# Patient Record
Sex: Male | Born: 1980 | Race: Black or African American | Hispanic: No | Marital: Single | Smoking: Current every day smoker
Health system: Southern US, Community
[De-identification: ages and names within clinical notes are randomized; demographics above are authoritative.]

## PROBLEM LIST (undated history)

## (undated) DIAGNOSIS — F319 Bipolar disorder, unspecified: Secondary | ICD-10-CM

## (undated) DIAGNOSIS — J189 Pneumonia, unspecified organism: Secondary | ICD-10-CM

## (undated) DIAGNOSIS — G8111 Spastic hemiplegia affecting right dominant side: Secondary | ICD-10-CM

## (undated) DIAGNOSIS — G35D Multiple sclerosis, unspecified: Secondary | ICD-10-CM

## (undated) DIAGNOSIS — F1910 Other psychoactive substance abuse, uncomplicated: Secondary | ICD-10-CM

## (undated) DIAGNOSIS — Z9359 Other cystostomy status: Secondary | ICD-10-CM

## (undated) DIAGNOSIS — G35 Multiple sclerosis: Secondary | ICD-10-CM

## (undated) DIAGNOSIS — D649 Anemia, unspecified: Secondary | ICD-10-CM

## (undated) DIAGNOSIS — F419 Anxiety disorder, unspecified: Secondary | ICD-10-CM

## (undated) DIAGNOSIS — G709 Myoneural disorder, unspecified: Secondary | ICD-10-CM

---
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
## 2008-12-23 ENCOUNTER — Emergency Department (HOSPITAL_COMMUNITY): Admission: EM | Admit: 2008-12-23 | Discharge: 2008-12-23 | Payer: Self-pay | Admitting: Emergency Medicine

## 2009-02-10 ENCOUNTER — Emergency Department (HOSPITAL_COMMUNITY): Admission: EM | Admit: 2009-02-10 | Discharge: 2009-02-10 | Payer: Self-pay | Admitting: Emergency Medicine

## 2009-02-11 ENCOUNTER — Emergency Department (HOSPITAL_COMMUNITY): Admission: EM | Admit: 2009-02-11 | Discharge: 2009-02-11 | Payer: Self-pay | Admitting: Emergency Medicine

## 2009-03-26 ENCOUNTER — Inpatient Hospital Stay (HOSPITAL_COMMUNITY): Admission: EM | Admit: 2009-03-26 | Discharge: 2009-03-30 | Payer: Self-pay | Admitting: Emergency Medicine

## 2009-04-01 ENCOUNTER — Emergency Department (HOSPITAL_COMMUNITY): Admission: EM | Admit: 2009-04-01 | Discharge: 2009-04-01 | Payer: Self-pay | Admitting: Emergency Medicine

## 2009-05-24 ENCOUNTER — Emergency Department (HOSPITAL_COMMUNITY): Admission: EM | Admit: 2009-05-24 | Discharge: 2009-05-24 | Payer: Self-pay | Admitting: Internal Medicine

## 2009-06-02 ENCOUNTER — Inpatient Hospital Stay (HOSPITAL_COMMUNITY): Admission: EM | Admit: 2009-06-02 | Discharge: 2009-06-05 | Payer: Self-pay | Admitting: Internal Medicine

## 2009-10-20 ENCOUNTER — Emergency Department (HOSPITAL_COMMUNITY): Admission: EM | Admit: 2009-10-20 | Discharge: 2009-10-20 | Payer: Self-pay | Admitting: Emergency Medicine

## 2009-11-17 ENCOUNTER — Emergency Department (HOSPITAL_COMMUNITY): Admission: EM | Admit: 2009-11-17 | Discharge: 2009-11-17 | Payer: Self-pay | Admitting: Emergency Medicine

## 2010-05-13 ENCOUNTER — Emergency Department (HOSPITAL_COMMUNITY): Admission: EM | Admit: 2010-05-13 | Discharge: 2010-05-13 | Payer: Self-pay | Admitting: Emergency Medicine

## 2010-05-26 ENCOUNTER — Emergency Department (HOSPITAL_COMMUNITY): Admission: EM | Admit: 2010-05-26 | Discharge: 2010-05-08 | Payer: Self-pay | Admitting: Emergency Medicine

## 2010-07-27 ENCOUNTER — Emergency Department (HOSPITAL_COMMUNITY)
Admission: EM | Admit: 2010-07-27 | Discharge: 2010-07-27 | Disposition: A | Discharge status: Home / Self Care | Payer: Medicaid Other | Attending: Emergency Medicine | Admitting: Emergency Medicine

## 2010-07-27 DIAGNOSIS — R197 Diarrhea, unspecified: Secondary | ICD-10-CM | POA: Insufficient documentation

## 2010-07-27 DIAGNOSIS — R109 Unspecified abdominal pain: Secondary | ICD-10-CM | POA: Insufficient documentation

## 2010-07-27 DIAGNOSIS — R112 Nausea with vomiting, unspecified: Secondary | ICD-10-CM | POA: Insufficient documentation

## 2010-07-27 DIAGNOSIS — G35 Multiple sclerosis: Secondary | ICD-10-CM | POA: Insufficient documentation

## 2010-07-27 LAB — CBC
Hemoglobin: 15.5 g/dL (ref 13.0–17.0)
MCH: 33 pg (ref 26.0–34.0)
MCV: 93.2 fL (ref 78.0–100.0)
RBC: 4.7 MIL/uL (ref 4.22–5.81)

## 2010-07-27 LAB — BASIC METABOLIC PANEL
CO2: 30 mEq/L (ref 19–32)
Chloride: 104 mEq/L (ref 96–112)
Creatinine, Ser: 1.26 mg/dL (ref 0.4–1.5)
GFR calc Af Amer: >60 mL/min (ref >60)

## 2010-07-27 LAB — OCCULT BLOOD, POC DEVICE: Fecal Occult Bld: NEGATIVE

## 2010-08-30 LAB — CBC
MCH: 33 pg (ref 26.0–34.0)
MCHC: 35.9 g/dL (ref 30.0–36.0)
MCV: 92 fL (ref 78.0–100.0)
Platelets: 191 10*3/uL (ref 150–400)
RDW: 13.7 % (ref 11.5–15.5)

## 2010-08-30 LAB — DIFFERENTIAL
Basophils Absolute: 0 10*3/uL (ref 0.0–0.1)
Lymphocytes Relative: 29 % (ref 12–46)
Monocytes Absolute: 0.6 10*3/uL (ref 0.1–1.0)
Monocytes Relative: 8 % (ref 3–12)
Neutro Abs: 4.6 10*3/uL (ref 1.7–7.7)

## 2010-08-30 LAB — RAPID URINE DRUG SCREEN, HOSP PERFORMED
Amphetamines: NOT DETECTED
Barbiturates: NOT DETECTED
Benzodiazepines: NOT DETECTED
Opiates: NOT DETECTED

## 2010-08-30 LAB — POCT I-STAT, CHEM 8
BUN: 19 mg/dL (ref 6–23)
Chloride: 110 mEq/L (ref 96–112)
HCT: 47 % (ref 39.0–52.0)
Sodium: 142 mEq/L (ref 135–145)
TCO2: 24 mmol/L (ref 0–100)

## 2010-08-30 LAB — LACTIC ACID, PLASMA
Lactic Acid, Venous: 2.1 mmol/L (ref 0.5–2.2)
Lactic Acid, Venous: 2.3 mmol/L — ABNORMAL HIGH (ref 0.5–2.2)

## 2010-08-30 LAB — URINALYSIS, ROUTINE W REFLEX MICROSCOPIC
Glucose, UA: NEGATIVE mg/dL
Hgb urine dipstick: NEGATIVE
Protein, ur: NEGATIVE mg/dL

## 2010-08-30 LAB — COMPREHENSIVE METABOLIC PANEL
Albumin: 4 g/dL (ref 3.5–5.2)
BUN: 15 mg/dL (ref 6–23)
Creatinine, Ser: 1.18 mg/dL (ref 0.4–1.5)
Total Protein: 6.8 g/dL (ref 6.0–8.3)

## 2010-08-30 LAB — PROTIME-INR: INR: 0.88 (ref 0.00–1.49)

## 2010-09-19 LAB — BASIC METABOLIC PANEL
BUN: 13 mg/dL (ref 6–23)
BUN: 9 mg/dL (ref 6–23)
CO2: 23 mEq/L (ref 19–32)
CO2: 26 mEq/L (ref 19–32)
Calcium: 8.8 mg/dL (ref 8.4–10.5)
Chloride: 105 mEq/L (ref 96–112)
Creatinine, Ser: 1.07 mg/dL (ref 0.4–1.5)
GFR calc non Af Amer: >60 mL/min (ref >60)
Glucose, Bld: 133 mg/dL — ABNORMAL HIGH (ref 70–99)
Glucose, Bld: 165 mg/dL — ABNORMAL HIGH (ref 70–99)
Potassium: 4.1 mEq/L (ref 3.5–5.1)

## 2010-09-19 LAB — CBC
HCT: 43.5 % (ref 39.0–52.0)
MCHC: 34.8 g/dL (ref 30.0–36.0)
MCV: 95.3 fL (ref 78.0–100.0)
Platelets: 213 10*3/uL (ref 150–400)
RDW: 13.7 % (ref 11.5–15.5)

## 2010-09-19 LAB — MAGNESIUM: Magnesium: 2.1 mg/dL (ref 1.5–2.5)

## 2010-09-20 LAB — CBC
HCT: 45.3 % (ref 39.0–52.0)
Hemoglobin: 15.7 g/dL (ref 13.0–17.0)
MCHC: 34.6 g/dL (ref 30.0–36.0)
Platelets: 236 10*3/uL (ref 150–400)
RDW: 13.8 % (ref 11.5–15.5)

## 2010-09-20 LAB — URINALYSIS, ROUTINE W REFLEX MICROSCOPIC
Bilirubin Urine: NEGATIVE
Ketones, ur: NEGATIVE mg/dL
Nitrite: NEGATIVE
Protein, ur: NEGATIVE mg/dL
Urobilinogen, UA: 1 mg/dL (ref 0.0–1.0)
pH: 7 (ref 5.0–8.0)

## 2010-09-20 LAB — DIFFERENTIAL
Basophils Absolute: 0 10*3/uL (ref 0.0–0.1)
Basophils Relative: 0 % (ref 0–1)
Eosinophils Relative: 1 % (ref 0–5)
Lymphocytes Relative: 30 % (ref 12–46)
Monocytes Absolute: 0.4 10*3/uL (ref 0.1–1.0)

## 2010-09-20 LAB — BASIC METABOLIC PANEL
BUN: 11 mg/dL (ref 6–23)
CO2: 27 mEq/L (ref 19–32)
Calcium: 9.6 mg/dL (ref 8.4–10.5)
GFR calc non Af Amer: >60 mL/min (ref >60)
Glucose, Bld: 107 mg/dL — ABNORMAL HIGH (ref 70–99)
Sodium: 141 mEq/L (ref 135–145)

## 2010-09-22 LAB — BASIC METABOLIC PANEL
CO2: 25 mEq/L (ref 19–32)
CO2: 25 mEq/L (ref 19–32)
CO2: 29 mEq/L (ref 19–32)
Calcium: 8.8 mg/dL (ref 8.4–10.5)
Chloride: 105 mEq/L (ref 96–112)
Chloride: 105 mEq/L (ref 96–112)
Chloride: 106 mEq/L (ref 96–112)
GFR calc Af Amer: >60 mL/min (ref >60)
GFR calc Af Amer: >60 mL/min (ref >60)
Glucose, Bld: 120 mg/dL — ABNORMAL HIGH (ref 70–99)
Glucose, Bld: 131 mg/dL — ABNORMAL HIGH (ref 70–99)
Potassium: 3.8 mEq/L (ref 3.5–5.1)
Potassium: 4 mEq/L (ref 3.5–5.1)
Sodium: 136 mEq/L (ref 135–145)
Sodium: 138 mEq/L (ref 135–145)
Sodium: 141 mEq/L (ref 135–145)

## 2010-09-22 LAB — COMPREHENSIVE METABOLIC PANEL
ALT: 25 U/L (ref 0–53)
Albumin: 4.3 g/dL (ref 3.5–5.2)
BUN: 10 mg/dL (ref 6–23)
Calcium: 8.9 mg/dL (ref 8.4–10.5)
Glucose, Bld: 131 mg/dL — ABNORMAL HIGH (ref 70–99)
Sodium: 139 mEq/L (ref 135–145)
Total Protein: 7.1 g/dL (ref 6.0–8.3)

## 2010-09-22 LAB — DIFFERENTIAL
Basophils Relative: 1 % (ref 0–1)
Eosinophils Relative: 1 % (ref 0–5)
Monocytes Absolute: 0.2 10*3/uL (ref 0.1–1.0)
Monocytes Relative: 4 % (ref 3–12)
Neutro Abs: 3.6 10*3/uL (ref 1.7–7.7)

## 2010-09-22 LAB — CBC
HCT: 46.2 % (ref 39.0–52.0)
Hemoglobin: 15.9 g/dL (ref 13.0–17.0)
MCHC: 34.5 g/dL (ref 30.0–36.0)
MCV: 95.6 fL (ref 78.0–100.0)
RBC: 4.83 MIL/uL (ref 4.22–5.81)
RDW: 13.9 % (ref 11.5–15.5)

## 2010-09-22 LAB — HEPATIC FUNCTION PANEL
AST: 30 U/L (ref 0–37)
Bilirubin, Direct: <0.1 mg/dL (ref 0.0–0.3)
Total Bilirubin: 0.4 mg/dL (ref 0.3–1.2)

## 2010-09-22 LAB — RAPID STREP SCREEN (MED CTR MEBANE ONLY): Streptococcus, Group A Screen (Direct): NEGATIVE

## 2010-09-22 LAB — HEMOCCULT GUIAC POC 1CARD (OFFICE): Fecal Occult Bld: NEGATIVE

## 2013-01-02 ENCOUNTER — Inpatient Hospital Stay (HOSPITAL_COMMUNITY)
Admission: EM | Admit: 2013-01-02 | Discharge: 2013-01-08 | DRG: 060 | Payer: Medicaid Other | Attending: Internal Medicine | Admitting: Internal Medicine

## 2013-01-02 ENCOUNTER — Emergency Department (HOSPITAL_COMMUNITY): Payer: Medicaid Other

## 2013-01-02 ENCOUNTER — Encounter (HOSPITAL_COMMUNITY): Payer: Self-pay | Admitting: Vascular Surgery

## 2013-01-02 DIAGNOSIS — F319 Bipolar disorder, unspecified: Secondary | ICD-10-CM | POA: Diagnosis present

## 2013-01-02 DIAGNOSIS — F17200 Nicotine dependence, unspecified, uncomplicated: Secondary | ICD-10-CM | POA: Diagnosis present

## 2013-01-02 DIAGNOSIS — F1210 Cannabis abuse, uncomplicated: Secondary | ICD-10-CM | POA: Diagnosis present

## 2013-01-02 DIAGNOSIS — R29810 Facial weakness: Secondary | ICD-10-CM | POA: Diagnosis present

## 2013-01-02 DIAGNOSIS — G35 Multiple sclerosis: Principal | ICD-10-CM | POA: Diagnosis present

## 2013-01-02 DIAGNOSIS — R030 Elevated blood-pressure reading, without diagnosis of hypertension: Secondary | ICD-10-CM | POA: Diagnosis present

## 2013-01-02 DIAGNOSIS — G8191 Hemiplegia, unspecified affecting right dominant side: Secondary | ICD-10-CM | POA: Diagnosis present

## 2013-01-02 DIAGNOSIS — G8190 Hemiplegia, unspecified affecting unspecified side: Secondary | ICD-10-CM

## 2013-01-02 DIAGNOSIS — R262 Difficulty in walking, not elsewhere classified: Secondary | ICD-10-CM | POA: Diagnosis present

## 2013-01-02 HISTORY — DX: Multiple sclerosis: G35

## 2013-01-02 HISTORY — DX: Bipolar disorder, unspecified: F31.9

## 2013-01-02 HISTORY — DX: Multiple sclerosis, unspecified: G35.D

## 2013-01-02 LAB — RAPID URINE DRUG SCREEN, HOSP PERFORMED
Amphetamines: NOT DETECTED
Benzodiazepines: NOT DETECTED
Cocaine: NOT DETECTED
Opiates: NOT DETECTED
Tetrahydrocannabinol: NOT DETECTED

## 2013-01-02 LAB — URINALYSIS, ROUTINE W REFLEX MICROSCOPIC
Glucose, UA: NEGATIVE mg/dL
Hgb urine dipstick: NEGATIVE
Protein, ur: NEGATIVE mg/dL

## 2013-01-02 LAB — BASIC METABOLIC PANEL
CO2: 29 mEq/L (ref 19–32)
Calcium: 9.6 mg/dL (ref 8.4–10.5)
Glucose, Bld: 85 mg/dL (ref 70–99)
Sodium: 140 mEq/L (ref 135–145)

## 2013-01-02 LAB — CBC WITH DIFFERENTIAL/PLATELET
Eosinophils Relative: 2 % (ref 0–5)
HCT: 40.9 % (ref 39.0–52.0)
Hemoglobin: 14.9 g/dL (ref 13.0–17.0)
Lymphocytes Relative: 37 % (ref 12–46)
Lymphs Abs: 1.3 10*3/uL (ref 0.7–4.0)
MCV: 89.9 fL (ref 78.0–100.0)
Monocytes Absolute: 0.7 10*3/uL (ref 0.1–1.0)
Platelets: 149 10*3/uL — ABNORMAL LOW (ref 150–400)
RBC: 4.55 MIL/uL (ref 4.22–5.81)
WBC: 3.4 10*3/uL — ABNORMAL LOW (ref 4.0–10.5)

## 2013-01-02 LAB — URINE MICROSCOPIC-ADD ON

## 2013-01-02 MED ORDER — ONDANSETRON HCL 4 MG/2ML IJ SOLN: 4.0000 mg | Freq: Four times a day (QID) | INTRAMUSCULAR | Status: DC | PRN

## 2013-01-02 MED ORDER — BACLOFEN 20 MG PO TABS
20.0000 mg | ORAL_TABLET | Freq: Three times a day (TID) | ORAL | Status: DC
  Administered 2013-01-02 – 2013-01-08 (×16): 20 mg via ORAL
  Filled 2013-01-02 (×19): qty 1

## 2013-01-02 MED ORDER — SODIUM CHLORIDE 0.9 % IV SOLN
1000.0000 mg | INTRAVENOUS | Status: AC
  Administered 2013-01-03 – 2013-01-05 (×3): 1000 mg via INTRAVENOUS
  Filled 2013-01-02 (×4): qty 8

## 2013-01-02 MED ORDER — METHYLPREDNISOLONE SODIUM SUCC 125 MG IJ SOLR
125.0000 mg | Freq: Once | INTRAMUSCULAR | Status: AC
  Administered 2013-01-02: 125 mg via INTRAVENOUS
  Filled 2013-01-02: qty 2

## 2013-01-02 MED ORDER — GABAPENTIN 600 MG PO TABS
600.0000 mg | ORAL_TABLET | Freq: Three times a day (TID) | ORAL | Status: DC
  Administered 2013-01-02 – 2013-01-08 (×16): 600 mg via ORAL
  Filled 2013-01-02 (×19): qty 1

## 2013-01-02 MED ORDER — ONDANSETRON HCL 4 MG PO TABS: 4.0000 mg | ORAL_TABLET | Freq: Four times a day (QID) | ORAL | Status: DC | PRN

## 2013-01-02 MED ORDER — GABAPENTIN 600 MG PO TABS
600.0000 mg | ORAL_TABLET | ORAL | Status: AC
  Administered 2013-01-02: 600 mg via ORAL
  Filled 2013-01-02: qty 1

## 2013-01-02 MED ORDER — HYDROMORPHONE HCL PF 1 MG/ML IJ SOLN
1.0000 mg | INTRAMUSCULAR | Status: DC | PRN
  Administered 2013-01-02: 1 mg via INTRAVENOUS
  Filled 2013-01-02: qty 1

## 2013-01-02 MED ORDER — HYDROMORPHONE HCL PF 1 MG/ML IJ SOLN
1.0000 mg | Freq: Once | INTRAMUSCULAR | Status: AC
  Administered 2013-01-02: 1 mg via INTRAVENOUS
  Filled 2013-01-02: qty 1

## 2013-01-02 MED ORDER — ALUM & MAG HYDROXIDE-SIMETH 200-200-20 MG/5ML PO SUSP: 30.0000 mL | Freq: Four times a day (QID) | ORAL | Status: DC | PRN

## 2013-01-02 MED ORDER — SODIUM CHLORIDE 0.9 % IV SOLN
1000.0000 mg | INTRAVENOUS | Status: AC
  Administered 2013-01-02: 1000 mg via INTRAVENOUS
  Filled 2013-01-02: qty 8

## 2013-01-02 MED ORDER — ACETAMINOPHEN 325 MG PO TABS: 650.0000 mg | ORAL_TABLET | Freq: Four times a day (QID) | ORAL | Status: DC | PRN

## 2013-01-02 MED ORDER — SODIUM CHLORIDE 0.9 % IV SOLN: 875.0000 mg | Freq: Once | INTRAVENOUS | Status: DC

## 2013-01-02 MED ORDER — HYDROCODONE-ACETAMINOPHEN 5-325 MG PO TABS
1.0000 | ORAL_TABLET | ORAL | Status: DC | PRN
  Administered 2013-01-02 – 2013-01-08 (×22): 2 via ORAL
  Filled 2013-01-02 (×2): qty 2
  Filled 2013-01-02: qty 1
  Filled 2013-01-02 (×18): qty 2
  Filled 2013-01-02: qty 1
  Filled 2013-01-02: qty 2

## 2013-01-02 MED ORDER — ACETAMINOPHEN 650 MG RE SUPP: 650.0000 mg | Freq: Four times a day (QID) | RECTAL | Status: DC | PRN

## 2013-01-02 MED ORDER — BACLOFEN 20 MG PO TABS
20.0000 mg | ORAL_TABLET | Freq: Once | ORAL | Status: AC
  Administered 2013-01-02: 20 mg via ORAL
  Filled 2013-01-02: qty 1

## 2013-01-02 MED ORDER — ENOXAPARIN SODIUM 40 MG/0.4ML ~~LOC~~ SOLN
40.0000 mg | SUBCUTANEOUS | Status: DC
  Administered 2013-01-02 – 2013-01-07 (×6): 40 mg via SUBCUTANEOUS
  Filled 2013-01-02 (×7): qty 0.4

## 2013-01-02 MED ORDER — SODIUM CHLORIDE 0.9 % IJ SOLN
3.0000 mL | Freq: Two times a day (BID) | INTRAMUSCULAR | Status: DC
  Administered 2013-01-03 – 2013-01-08 (×9): 3 mL via INTRAVENOUS

## 2013-01-02 MED ORDER — SODIUM CHLORIDE 0.9 % IV SOLN
INTRAVENOUS | Status: DC
  Administered 2013-01-02 – 2013-01-04 (×2): via INTRAVENOUS

## 2013-01-02 NOTE — ED Provider Notes (Signed)
   History    CSN: [id number] Arrival date & time 01/02/13  1320  First MD Initiated Contact with Patient 01/02/13 1340     Chief Complaint  Patient presents with  . Extremity Weakness   (Consider location/radiation/quality/duration/timing/severity/associated sxs/prior Treatment) Patient is a 32 y.o. male presenting with extremity weakness. The history is provided by the patient and the police.  Extremity Weakness Associated symptoms comments: Bradley Brandt is a 32 y.o. male h/o MS here with an acute exacerbation of the same.  Patient states he has had a flare for the past 2 weeks.  His last flare was 1 year ago. He has been in jail for the past 2 months and states he has not been getting his rebif as he is suppose to.  He also takes baclofen and oxycontin for pain relief.  He has R side weakness and R facial droop.He denies other neurological symptoms.   ROS is otherwise negative.  .   Past Medical History  Diagnosis Date  . MS (multiple sclerosis)   . Bipolar 1 disorder    Past Surgical History  Procedure Laterality Date  . Tumor removal  abdomen    benign   No family history on file. History  Substance Use Topics  . Smoking status: Current Some Day Smoker -- 1.00 packs/day    Types: Cigarettes  . Smokeless tobacco: Not on file  . Alcohol Use: Yes     Comment: before he was incarcerated he drack a lot everyday    Review of Systems  Musculoskeletal: Positive for extremity weakness.  10 Systems reviewed and are negative for acute change except as noted in the HPI.   Allergies  Lithium  Home Medications   Current Outpatient Rx  Name  Route  Sig  Dispense  Refill  . baclofen (LIORESAL) 20 MG tablet   Oral   Take 20 mg by mouth 3 (three) times daily.         [name] gabapentin (NEURONTIN) 600 MG tablet   Oral   Take 600 mg by mouth 3 (three) times daily.          BP 128/66  Pulse 70  Temp(Src) 98.1 F (36.7 C) (Oral)  Resp 19  SpO2 96% Physical Exam  ED  Course  Procedures (including critical care time) Labs Reviewed  CBC WITH DIFFERENTIAL  BASIC METABOLIC PANEL  URINALYSIS, ROUTINE W REFLEX MICROSCOPIC  URINE RAPID DRUG SCREEN (HOSP PERFORMED)   Dg Chest Port 1 View  01/02/2013   *RADIOLOGY REPORT*  Clinical Data: Flare of multiple sclerosis  PORTABLE CHEST - 1 VIEW  Comparison: Portable exam 1416 hours compared to 05/08/2010  Findings: Normal heart size, mediastinal contours, and pulmonary vascularity. Lungs clear. Bones unremarkable. No pneumothorax.  IMPRESSION: No acute abnormalities.   Original Report Authenticated By: Ulyses Southward, M.D.   No diagnosis found.  MDM  Patient given high dose IV steroids and pain medication for relief.  Patient is otherwise comfortable.  Will consult neurology for bedside evaluation.  Spoke with Neurology PA, who recommends for the patients to be inpatient for high dose solumedrol treatment.  1G/day x 5 days.  Also recs inpatient MRI of the brain for further evaluation.  Will call [facility] for admission.  Tomasita Crumble, MD 01/02/13 [id number]

## 2013-01-02 NOTE — Consult Note (Signed)
NEURO HOSPITALIST CONSULT NOTE    Reason for Consult: right hemiparesis, MS exacerbation  HPI:                                                                                                                                          Bradley Brandt is an 32 y.o. male with a past medical history significant for bipolar disorder, RR-MS since 2006, brought to [facility] for evaluation of right hemiparesis and difficulty walking. He said that he is takes Rebif for his MS and this is his first exacerbation while on Rebif. Last MS relapse was approximately 1 year ago and consisted of left sided weakness that resolved after receiving IV steroids. According to Mr. Tinnell, he has been experiencing progressive weakness of the right arm, leg, and for for the past 3 weeks, to the point that now he can not use the right arm and is virtually impossible for him to walk. No falls. In addition, he complains of pain of the right leg and lower back. Denies headache, vertigo, double vision, difficulty swallowing, or language impairment. No bladder or bowel dysfunction. Said that he can not see well. No recent fever or infection.   Past Medical History  Diagnosis Date  . MS (multiple sclerosis)   . Bipolar 1 disorder     Past Surgical History  Procedure Laterality Date  . Tumor removal  abdomen    benign    No family history on file.  Family History:   Social History:  reports that he has been smoking Cigarettes.  He has been smoking about 1.00 pack per day. He does not have any smokeless tobacco history on file. He reports that  drinks alcohol. He reports that he uses illicit drugs (Marijuana).  Allergies  Allergen Reactions  . Lithium Rash    MEDICATIONS:                                                                                                                     I have reviewed the patient's current medications.   ROS:  History obtained from the patient and chart review.  General ROS: negative for - chills, fatigue, fever, night sweats, weight gain or weight loss Psychological ROS: negative for - behavioral disorder, hallucinations, memory difficulties, mood swings or suicidal ideation Ophthalmic ROS: negative for - blurry vision, double vision, eye pain ENT ROS: negative for - epistaxis, nasal discharge, oral lesions, sore throat, tinnitus or vertigo Allergy and Immunology ROS: negative for - hives or itchy/watery eyes Hematological and Lymphatic ROS: negative for - bleeding problems, bruising or swollen lymph nodes Endocrine ROS: negative for - galactorrhea, hair pattern changes, polydipsia/polyuria or temperature intolerance Respiratory ROS: negative for - cough, hemoptysis, shortness of breath or wheezing Cardiovascular ROS: negative for - chest pain, dyspnea on exertion, edema or irregular heartbeat Gastrointestinal ROS: negative for - abdominal pain, diarrhea, hematemesis, nausea/vomiting or stool incontinence Genito-Urinary ROS: negative for - dysuria, hematuria, incontinence or urinary frequency/urgency Musculoskeletal ROS: negative for - joint swelling Neurological ROS: as noted in HPI Dermatological ROS: negative for rash and skin lesion changes      Physical exam: pleasant male in no apparent distress. Blood pressure 97/56, pulse 53, temperature 98.1 F (36.7 C), temperature source Oral, resp. rate 18, SpO2 100.00%. Head: normocephalic. Neck: supple, no bruits, no JVD. Cardiac: no murmurs. Lungs: clear. Abdomen: soft, no tender, no mass. Extremities: no edema.    Neurologic Examination:                                                                                                      Mental Status: Alert, oriented, thought content appropriate.  Speech fluent without evidence of aphasia.  Able to follow 3  step commands without difficulty. Cranial Nerves: II: Discs flat bilaterally; Visual fields grossly normal, pupils equal, round, reactive to light and accommodation III,IV, VI: ptosis not present, extra-ocular motions intact bilaterally V:facial light touch sensation normal bilaterally VII: right face weakness VIII: hearing normal bilaterally IX,X: gag reflex present XI: bilateral shoulder shrug XII: midline tongue extension Motor: Significant for dense right hemiparesis     Tone: diminished in the right. Sensory: Pinprick and light touch intact throughout, bilaterally Deep Tendon Reflexes:  2+ all over  Plantars: Right: downgoing   Left: upgoing Cerebellar: No tested  Gait: Unable to test CV: pulses palpable throughout    No results found for this basename: cbc, bmp, coags, chol, tri, ldl, hga1c    Results for orders placed during the hospital encounter of 01/02/13 (from the past 48 hour(s))  CBC WITH DIFFERENTIAL     Status: Abnormal   Collection Time    01/02/13  1:51 PM      Result Value Range   WBC 3.4 (*) 4.0 - 10.5 K/uL   RBC 4.55  4.22 - 5.81 MIL/uL   Hemoglobin 14.9  13.0 - 17.0 g/dL   HCT 40.9  81.1 - 91.4 %   MCV 89.9  78.0 - 100.0 fL   MCH 32.7  26.0 - 34.0 pg   MCHC 36.4 (*) 30.0 - 36.0 g/dL   RDW 78.2  95.6 - 21.3 %   Platelets 149 (*)  150 - 400 K/uL   Neutrophils Relative % 41 (*) 43 - 77 %   Neutro Abs 1.4 (*) 1.7 - 7.7 K/uL   Lymphocytes Relative 37  12 - 46 %   Lymphs Abs 1.3  0.7 - 4.0 K/uL   Monocytes Relative 20 (*) 3 - 12 %   Monocytes Absolute 0.7  0.1 - 1.0 K/uL   Eosinophils Relative 2  0 - 5 %   Eosinophils Absolute 0.1  0.0 - 0.7 K/uL   Basophils Relative 0  0 - 1 %   Basophils Absolute 0.0  0.0 - 0.1 K/uL  BASIC METABOLIC PANEL     Status: None   Collection Time    01/02/13  1:51 PM      Result Value Range   Sodium 140  135 - 145 mEq/L   Potassium 4.2  3.5 - 5.1 mEq/L   Chloride 105  96 - 112 mEq/L   CO2 29  19 - 32 mEq/L    Glucose, Bld 85  70 - 99 mg/dL   BUN 11  6 - 23 mg/dL   Creatinine, Ser 1.61  0.50 - 1.35 mg/dL   Calcium 9.6  8.4 - 09.6 mg/dL   GFR calc non Af Amer >90  >90 mL/min   GFR calc Af Amer >90  >90 mL/min   Comment:            The eGFR has been calculated     using the CKD EPI equation.     This calculation has not been     validated in all clinical     situations.     eGFR's persistently     <90 mL/min signify     possible Chronic Kidney Disease.  URINALYSIS, ROUTINE W REFLEX MICROSCOPIC     Status: Abnormal   Collection Time    01/02/13  2:53 PM      Result Value Range   Color, Urine YELLOW  YELLOW   APPearance HAZY (*) CLEAR   Specific Gravity, Urine 1.016  1.005 - 1.030   pH 7.5  5.0 - 8.0   Glucose, UA NEGATIVE  NEGATIVE mg/dL   Hgb urine dipstick NEGATIVE  NEGATIVE   Bilirubin Urine NEGATIVE  NEGATIVE   Ketones, ur NEGATIVE  NEGATIVE mg/dL   Protein, ur NEGATIVE  NEGATIVE mg/dL   Urobilinogen, UA 1.0  0.0 - 1.0 mg/dL   Nitrite NEGATIVE  NEGATIVE   Leukocytes, UA TRACE (*) NEGATIVE  URINE RAPID DRUG SCREEN (HOSP PERFORMED)     Status: None   Collection Time    01/02/13  2:53 PM      Result Value Range   Opiates NONE DETECTED  NONE DETECTED   Cocaine NONE DETECTED  NONE DETECTED   Benzodiazepines NONE DETECTED  NONE DETECTED   Amphetamines NONE DETECTED  NONE DETECTED   Tetrahydrocannabinol NONE DETECTED  NONE DETECTED   Barbiturates NONE DETECTED  NONE DETECTED   Comment:            DRUG SCREEN FOR MEDICAL PURPOSES     ONLY.  IF CONFIRMATION IS NEEDED     FOR ANY PURPOSE, NOTIFY LAB     WITHIN 5 DAYS.                LOWEST DETECTABLE LIMITS     FOR URINE DRUG SCREEN     Drug Class       Cutoff (ng/mL)     Amphetamine  1000     Barbiturate      200     Benzodiazepine   200     Tricyclics       300     Opiates          300     Cocaine          300     THC              50  URINE MICROSCOPIC-ADD ON     Status: None   Collection Time    01/02/13  2:53 PM       Result Value Range   Squamous Epithelial / LPF RARE  RARE   WBC, UA 0-2  <3 WBC/hpf   RBC / HPF 0-2  <3 RBC/hpf   Bacteria, UA RARE  RARE   Urine-Other AMORPHOUS URATES/PHOSPHATES      Dg Chest Port 1 View  01/02/2013   *RADIOLOGY REPORT*  Clinical Data: Flare of multiple sclerosis  PORTABLE CHEST - 1 VIEW  Comparison: Portable exam 1416 hours compared to 05/08/2010  Findings: Normal heart size, mediastinal contours, and pulmonary vascularity. Lungs clear. Bones unremarkable. No pneumothorax.  IMPRESSION: No acute abnormalities.   Original Report Authenticated By: Ulyses Southward, M.D.     Assessment/Plan: 33 y/o with a established diagnosis of RR-MS, comes in with progressive right hemiparesis and right face weakness for the last 3 weeks, most likely resulting from MS exacerbation. Recommend: 1) IV solumedrol 1 gram daily for 3-5 days. 2) MRI brain with and without contrast. 3) Will follow up with you.  Wyatt Portela, MD [facility] [phone]  01/02/2013, 4:42 PM

## 2013-01-02 NOTE — ED Notes (Addendum)
Pt reports to the ED for eval of right sided weakness that began 2 weeks ago. Pt has hx of MS. Pt unable to ambulate x 2 days. Pt has been incarcerated x 2 months. Pt went approx 2 weeks a month and a half ago without his medication for the MS. Grips asymmetrical. Pt complaining of numbness to the right side of the face and entire right side. Right sided facial droop noted. Pt A&O x4. VSS en route. Pt denies any pain at this time.

## 2013-01-02 NOTE — H&P (Signed)
History and Physical       Hospital Admission Note Date: 01/02/2013  Patient name: Bradley Brandt Medical record number: [id number] Date of birth: September 01, 1980 Age: 32 y.o. Gender: male PCP: No primary provider on file.    Chief Complaint:  Right-sided weakness  HPI: Patient is a 32 year old African American male with history of bipolar disorder, RR-MS diagnosed in 2006, currently in prison since May 13th, 2014 was brought to [facility] for evaluation of a right-sided weakness and difficulty  walking. Patient reported that his last MS relapse was one year ago and had left-sided weakness at that time which resolved after steroids. He has been taking Rebif for MS. Patient reports that he has not been getting Rebif as he is supposed to in the prison. He also has right-sided facial droop. Per patient he has been having difficulty ambulating.  Review of Systems:  Constitutional: Denies fever, chills, diaphoresis, poor appetite and fatigue.  HEENT: Denies photophobia, eye pain, redness, hearing loss, ear pain, congestion, sore throat, rhinorrhea, sneezing, mouth sores, trouble swallowing, neck pain, neck stiffness and tinnitus.   Respiratory: Denies SOB, DOE, cough, chest tightness,  and wheezing.   Cardiovascular: Denies chest pain, palpitations and leg swelling.  Gastrointestinal: Denies nausea, vomiting, abdominal pain, diarrhea, constipation, blood in stool and abdominal distention.  Genitourinary: Denies dysuria, urgency, frequency, hematuria, flank pain and difficulty urinating.  Musculoskeletal: Please see history of present illness  Skin: Denies pallor, rash and wound.  Neurological: Please see history of present illness Hematological: Denies adenopathy. Easy bruising, personal or family bleeding history  Psychiatric/Behavioral: Denies suicidal ideation, mood changes, confusion, nervousness, sleep disturbance and  agitation  Past Medical History: Past Medical History  Diagnosis Date  . MS (multiple sclerosis)   . Bipolar 1 disorder    Past Surgical History  Procedure Laterality Date  . Tumor removal  abdomen    benign    Medications: Prior to Admission medications   Medication Sig Start Date End Date Taking? Authorizing Provider  baclofen (LIORESAL) 20 MG tablet Take 20 mg by mouth 3 (three) times daily.   Yes Historical Provider, MD  gabapentin (NEURONTIN) 600 MG tablet Take 600 mg by mouth 3 (three) times daily.   Yes Historical Provider, MD    Allergies:   Allergies  Allergen Reactions  . Lithium Rash    Social History:  reports that he has been smoking Cigarettes.  He has been smoking about 1.00 pack per day. He does not have any smokeless tobacco history on file. He reports that  drinks alcohol. He reports that he uses illicit drugs (Marijuana).  Family History: No family history on file.  Physical Exam: Blood pressure 97/56, pulse 53, temperature 98.1 F (36.7 C), temperature source Oral, resp. rate 18, SpO2 100.00%. General: Alert, awake, oriented x3, in no acute distress, no aphasia or dysarthria, mild right-sided facial drooping. HEENT: normocephalic, atraumatic, anicteric sclera, pink conjunctiva, pupils equal and reactive to light and accomodation, oropharynx clear Neck: supple, no masses or lymphadenopathy, no goiter, no bruits  Heart: Regular rate and rhythm, without murmurs, rubs or gallops. Lungs: Clear to auscultation bilaterally, no wheezing, rales or rhonchi. Abdomen: Soft, nontender, nondistended, positive bowel sounds, no masses. Extremities: No clubbing, cyanosis or edema with positive pedal pulses. Neuro: No dysarthria, mild fascial drooping on the right, right-sided hemiparesis, left strength 5 x 5 upper and lower extremities Psych: alert and oriented x 3, normal mood and affect Skin: no rashes or lesions, warm and dry  LABS on Admission:  Basic  Metabolic Panel:  Recent Labs Lab 01/02/13 1351  NA 140  K 4.2  CL 105  CO2 29  GLUCOSE 85  BUN 11  CREATININE 1.03  CALCIUM 9.6   Liver Function Tests: No results found for this basename: AST, ALT, ALKPHOS, BILITOT, PROT, ALBUMIN,  in the last 168 hours No results found for this basename: LIPASE, AMYLASE,  in the last 168 hours No results found for this basename: AMMONIA,  in the last 168 hours CBC:  Recent Labs Lab 01/02/13 1351  WBC 3.4*  NEUTROABS 1.4*  HGB 14.9  HCT 40.9  MCV 89.9  PLT 149*   Cardiac Enzymes: No results found for this basename: CKTOTAL, CKMB, CKMBINDEX, TROPONINI,  in the last 168 hours BNP: No components found with this basename: POCBNP,  CBG: No results found for this basename: GLUCAP,  in the last 168 hours   Radiological Exams on Admission: Dg Chest Port 1 View  01/02/2013   *RADIOLOGY REPORT*  Clinical Data: Flare of multiple sclerosis  PORTABLE CHEST - 1 VIEW  Comparison: Portable exam 1416 hours compared to 05/08/2010  Findings: Normal heart size, mediastinal contours, and pulmonary vascularity. Lungs clear. Bones unremarkable. No pneumothorax.  IMPRESSION: No acute abnormalities.   Original Report Authenticated By: Ulyses Southward, M.D.    Assessment/Plan Principal Problem:   Right hemiparesis likely secondary to MS flare however rule out acute CVA - Patient has been seen by neurology in ED, recommended high-dose IV Solu-Medrol 1 g daily for  3-5 days - MRI of the brain to rule out stroke , already ordered by neurology  Active Problems:   Multiple sclerosis flare - As #1  DVT prophylaxis: Lovenox  CODE STATUS: Full code  Further plan will depend as patient's clinical course evolves and further radiologic and laboratory data become available.   Time Spent on Admission: 1 hour  Aarion Metzgar M.D. [facility] 01/02/2013, 5:47 PM Pager: [phone]  If 7PM-7AM, please contact night-coverage [email or web address] Password  [email or web address]

## 2013-01-02 NOTE — ED Notes (Signed)
Neurology at the bedside

## 2013-01-03 ENCOUNTER — Encounter (HOSPITAL_COMMUNITY): Payer: Self-pay | Admitting: Internal Medicine

## 2013-01-03 ENCOUNTER — Inpatient Hospital Stay (HOSPITAL_COMMUNITY): Payer: Medicaid Other

## 2013-01-03 DIAGNOSIS — F319 Bipolar disorder, unspecified: Secondary | ICD-10-CM | POA: Insufficient documentation

## 2013-01-03 DIAGNOSIS — G35D Multiple sclerosis, unspecified: Principal | ICD-10-CM

## 2013-01-03 DIAGNOSIS — G35 Multiple sclerosis: Principal | ICD-10-CM

## 2013-01-03 HISTORY — DX: Bipolar disorder, unspecified: F31.9

## 2013-01-03 LAB — GLUCOSE, CAPILLARY
Glucose-Capillary: 153 mg/dL — ABNORMAL HIGH (ref 70–99)
Glucose-Capillary: 131 mg/dL — ABNORMAL HIGH (ref 70–99)

## 2013-01-03 LAB — CBC
Hemoglobin: 14.8 g/dL (ref 13.0–17.0)
MCHC: 36.2 g/dL — ABNORMAL HIGH (ref 30.0–36.0)
RDW: 12.6 % (ref 11.5–15.5)

## 2013-01-03 LAB — BASIC METABOLIC PANEL
BUN: 14 mg/dL (ref 6–23)
GFR calc Af Amer: >90 mL/min (ref >90)
GFR calc non Af Amer: >90 mL/min (ref >90)
Potassium: 4.1 mEq/L (ref 3.5–5.1)

## 2013-01-03 MED ORDER — LORAZEPAM 2 MG/ML IJ SOLN
1.0000 mg | Freq: Once | INTRAMUSCULAR | Status: AC
  Administered 2013-01-03: 09:00:00 via INTRAVENOUS
  Filled 2013-01-03: qty 1

## 2013-01-03 MED ORDER — GADOBENATE DIMEGLUMINE 529 MG/ML IV SOLN
20.0000 mL | Freq: Once | INTRAVENOUS | Status: AC | PRN
  Administered 2013-01-03: 20 mL via INTRAVENOUS

## 2013-01-03 NOTE — Progress Notes (Signed)
PT/OT Cancellation Note  Patient Details Name: Bradley Brandt MRN: [id number] DOB: 29-Jun-1980   Cancelled Treatment:    Reason Eval/Treat Not Completed: Patient at procedure or test/unavailable (MRI).  PT/OT will check back later as time allows.     Rollene Rotunda Medendorp, PT, DPT [phone] Sherryl Manges, OTS   01/03/2013, 9:21 AM

## 2013-01-03 NOTE — Evaluation (Signed)
Physical Therapy Evaluation Patient Details Name: Bradley Brandt MRN: [id number] DOB: 09-17-1980 Today's Date: 01/03/2013 Time: 4098-1191 PT Time Calculation (min): 33 min  PT Assessment / Plan / Recommendation History of Present Illness  32 y.o. male admitted [facility] from prison for MS flare and increased R sided weakness, inability to walk.  MRI revealied 1.1 cm rounded lesion posterior left eriventricular white matter and 6 mm lesion posterior right frontal lobe.    Clinical Impression  Pt's physical presentation is not completely consistent with his functional presentation or his MRI findings.  He presents significantly weaker on his right side with inability to walk or stand without significant assistance. He also has decreased sitting balance and trunk control in sitting.  He will need continued PT f/u due to his current presentation.      PT Assessment  Patient needs continued PT services    Follow Up Recommendations  Other (comment) (pt may need PT f/u at discharge depending on progress )    Does the patient have the potential to tolerate intense rehabilitation     yes  Barriers to Discharge Other (comment) (going back to prison) May need to make arrangements depending on how well he walks to be in medical ward.  I am not really sure how this works in the prison system, so case management will have to f/u.  I do know that some prisons do have access to PT, but not all.      Equipment Recommendations  Other (comment) (TBD)    Recommendations for Other Services   none  Frequency Min 3X/week    Precautions / Restrictions Precautions Precautions: Fall Precaution Comments: R hemiperesis upper and lower extremity Restrictions Other Position/Activity Restrictions: pt's legs bil shackeled to the bed.     Pertinent Vitals/Pain Pt reports chronic low back pain which has been worse here in the hospital.  He did not rate pain during my session.        Mobility  Bed Mobility Bed  Mobility: Supine to Sit;Sitting - Scoot to Edge of Bed;Sit to Supine Supine to Sit: 4: Min guard;HOB elevated;With rails Sitting - Scoot to Edge of Bed: 4: Min guard;With rail Sit to Supine: 5: Supervision;With rail;HOB flat Details for Bed Mobility Assistance: min guard assist to support trunk to prevent LOB posteriorly.   Transfers Transfers: Sit to Stand;Stand to Sit Sit to Stand: 3: Mod assist;From elevated surface;With upper extremity assist;From bed Stand to Sit: 3: Mod assist;With upper extremity assist;To bed Details for Transfer Assistance: mod assist to support trunk in standing and to block right knee.  Pt leaning to the left and posteriorly in standing.  Transfer to chair not attempted due to significant difficulty in standing.  Would need two person assist to be safe.  Again, physical presentation in this case does not support functional presentation.  I would expect better right leg control based on his MMT EOB.   Ambulation/Gait Ambulation/Gait Assistance: Not tested (comment) (due to not safe without second person to assist.  )        PT Diagnosis: Difficulty walking;Abnormality of gait;Hemiplegia dominant side  PT Problem List: Decreased strength;Decreased activity tolerance;Decreased balance;Decreased mobility;Decreased coordination;Decreased knowledge of use of DME;Impaired sensation;Pain PT Treatment Interventions: DME instruction;Gait training;Functional mobility training;Therapeutic activities;Balance training;Therapeutic exercise;Stair training;Neuromuscular re-education;Patient/family education;Modalities     PT Goals(Current goals can be found in the care plan section) Acute Rehab PT Goals Patient Stated Goal: to go back to his normal level of function and return to the  same block he was in with his "partner" PTA.   PT Goal Formulation: With patient Time For Goal Achievement: 01/17/13 Potential to Achieve Goals: Good  Visit Information  Last PT Received On:  01/03/13 History of Present Illness: 33 y.o. male admitted to [facility] from prison for MS flare and increased R sided weakness, inability to walk.  MRI revealied 1.1 cm rounded lesion posterior left eriventricular white matter and 6 mm lesion posterior right frontal lobe.         Prior Functioning  Home Living Family/patient expects to be discharged to:: Dentention/Prison Additional Comments: Pt was in general population until he could not walk and then he was in medical ward x 1 day.   Prior Function Level of Independence: Needs assistance Gait / Transfers Assistance Needed: min assist Comments: per pt his "partner" in prison would give him hand held assist to walk and would bring him his lunch tray to the table.  He is very concerned about returning to prison because he is not sure he will be on the same block as his "partner" who protects him and helps him walk.   Communication Communication: No difficulties Dominant Hand: Right    Cognition  Cognition Arousal/Alertness: Awake/alert Behavior During Therapy: WFL for tasks assessed/performed Overall Cognitive Status:  (bipolar disorder)    Extremity/Trunk Assessment Upper Extremity Assessment Upper Extremity Assessment: Defer to OT evaluation Lower Extremity Assessment Lower Extremity Assessment: RLE deficits/detail RLE Deficits / Details: right ankle 3-/5, right knee ext 3-/5, right knee flexion 4/5, hip NT, but functionally pt is able to lift hip against gravity to bring leg to EOB without external assistance, so functionally I would rate him at 3-/5.   RLE Sensation: decreased light touch RLE Coordination: decreased fine motor;decreased gross motor Cervical / Trunk Assessment Cervical / Trunk Assessment: Other exceptions Cervical / Trunk Exceptions: Pt demonstrated dereased trunk strength in sitting as exhibited by decreased sitting balance.  Per nerology PA his area of flare on MRI should not affect his trunk strength.  Functionally he  was able to preform a partial sit up to reposition himself in bed.  His functional presentation is inconsistant throughout exam.     Balance Static Sitting Balance Static Sitting - Balance Support: Left upper extremity supported;Feet supported Static Sitting - Level of Assistance: 4: Min assist Static Sitting - Comment/# of Minutes: 10 mins EOB while PT/PA preformed exam.  LOB x 2 posteriorly requiring min assist to prevent trunk from going back to bed or to help pt back to sitting from semi supine position.   Static Standing Balance Static Standing - Balance Support: No upper extremity supported Static Standing - Level of Assistance: 3: Mod assist Static Standing - Comment/# of Minutes: mod assist to stand x 2 ~30-45 sec with right leg blocked.  Bed elevated and pt relying heavily on bil lower extremity support on bed for balance and stability.  Left and posterior lean.    End of Session PT - End of Session Equipment Utilized During Treatment: Gait belt Activity Tolerance: Patient limited by fatigue Patient left: in bed;with call bell/phone within reach;Other (comment) (with guard present, reshackeled to the bed.  )    Lurena Joiner B. Jedadiah Abdallah, PT, DPT [phone]   01/03/2013, 2:07 PM

## 2013-01-03 NOTE — Progress Notes (Signed)
[facility] PROGRESS NOTE  Bradley Brandt ZOX:[id number] DOB: [birth year] DOA: 01/02/2013 PCP: No primary provider on file.  Assessment/Plan: #1 right-sided weakness/progressive right hemiparesis Likely secondary to multiple sclerosis flare versus acute CVA. MRI of the head is pending. Continue IV Solu-Medrol per neurology recommendations. Neurology is following and appreciate input and recommendations.  #2 elevated blood pressure Patient denies any history of hypertension. We'll follow patient's blood pressures. If still elevated will start patient on antihypertensive medication.  #3 bipolar disorder Stable. Outpatient followup.  #4 MS See problem #1.  Code Status: Full Family Communication: Updated patient no family at bedside. Disposition Plan: Back to jail when medically stable.   Consultants:  Neurology: Dr. Cyril Mourning 01/02/2013  Procedures:  MRI head pending  Chest x-ray 01/02/2013  Antibiotics:  None  HPI/Subjective: Patient with complaints of right sided weakness.  Objective: Filed Vitals:   01/02/13 1953 01/02/13 2106 01/02/13 2200 01/03/13 0529  BP: 125/67  138/74 160/76  Pulse: 70  74 62  Temp: 98.2 F (36.8 C)  97.9 F (36.6 C) 97.8 F (36.6 C)  TempSrc: Oral  Oral Oral  Resp: 18  18 18   Height:  5\' 10"  (1.778 m)    Weight:  98.9 kg (218 lb 0.6 oz)    SpO2: 95%  98% 97%    Intake/Output Summary (Last 24 hours) at 01/03/13 0915 Last data filed at 01/03/13 0844  Gross per 24 hour  Intake    400 ml  Output   1050 ml  Net   -650 ml   Filed Weights   01/02/13 2106  Weight: 98.9 kg (218 lb 0.6 oz)    Exam:   General:  NAD  Cardiovascular: RRR  Respiratory: CTAB  Abdomen: sOFT/nt/nd/+bs  Musculoskeletal: 3-4/5 RUE strength, 3/5 RLE strength.  Data Reviewed: Basic Metabolic Panel:  Recent Labs Lab 01/02/13 1351 01/03/13 0435  NA 140 137  K 4.2 4.1  CL 105 101  CO2 29 26  GLUCOSE 85 158*  BUN 11 14  CREATININE 1.03 0.86   CALCIUM 9.6 9.4   Liver Function Tests: No results found for this basename: AST, ALT, ALKPHOS, BILITOT, PROT, ALBUMIN,  in the last 168 hours No results found for this basename: LIPASE, AMYLASE,  in the last 168 hours No results found for this basename: AMMONIA,  in the last 168 hours CBC:  Recent Labs Lab 01/02/13 1351 01/03/13 0435  WBC 3.4* 5.6  NEUTROABS 1.4*  --   HGB 14.9 14.8  HCT 40.9 40.9  MCV 89.9 89.1  PLT 149* 165   Cardiac Enzymes: No results found for this basename: CKTOTAL, CKMB, CKMBINDEX, TROPONINI,  in the last 168 hours BNP (last 3 results) No results found for this basename: PROBNP,  in the last 8760 hours CBG: No results found for this basename: GLUCAP,  in the last 168 hours  No results found for this or any previous visit (from the past 240 hour(s)).   Studies: Dg Chest Port 1 View  01/02/2013   *RADIOLOGY REPORT*  Clinical Data: Flare of multiple sclerosis  PORTABLE CHEST - 1 VIEW  Comparison: Portable exam 1416 hours compared to 05/08/2010  Findings: Normal heart size, mediastinal contours, and pulmonary vascularity. Lungs clear. Bones unremarkable. No pneumothorax.  IMPRESSION: No acute abnormalities.   Original Report Authenticated By: Ulyses Southward, M.D.    Scheduled Meds: . baclofen  20 mg Oral TID  . enoxaparin (LOVENOX) injection  40 mg Subcutaneous Q24H  . gabapentin  600 mg Oral TID  .  methylPREDNISolone (SOLU-MEDROL) injection  1,000 mg Intravenous Q24H  . sodium chloride  3 mL Intravenous Q12H   Continuous Infusions: . sodium chloride 75 mL/hr at 01/02/13 2106    Principal Problem:   Right hemiparesis Active Problems:   Multiple sclerosis flare   Bipolar disorder, unspecified    Time spent: > 35 mins    [facility]  [facility] Pager [phone]. If 7PM-7AM, please contact night-coverage at [email or web address], password [facility] 01/03/2013, 9:15 AM  LOS: 1 day

## 2013-01-03 NOTE — Progress Notes (Signed)
Pt reports no relief from back pain with Vicodin, stating that before he was incarcerated, he was taking doses of 15 mg of Oxycodone when needed.  Pt has not had any pain meds while in jail.

## 2013-01-03 NOTE — Progress Notes (Signed)
Utilization review completed. Terel Bann, RN, BSN. 

## 2013-01-03 NOTE — Progress Notes (Signed)
NEURO HOSPITALIST PROGRESS NOTE   SUBJECTIVE:                                                                                                                         Still feeling weak on the right side and has decreased sensation on the right arm and face.  OBJECTIVE:                                                                                                                           Vital signs in last 24 hours: Temp:  [97.8 F (36.6 C)-98.2 F (36.8 C)] 97.8 F (36.6 C) (07/18 1055) Pulse Rate:  [53-74] 70 (07/18 1055) Resp:  [16-19] 18 (07/18 1055) BP: (97-160)/(56-78) 129/66 mmHg (07/18 1055) SpO2:  [95 %-100 %] 97 % (07/18 1055) Weight:  [98.9 kg (218 lb 0.6 oz)] 98.9 kg (218 lb 0.6 oz) (07/17 2106)  Intake/Output from previous day: 07/17 0701 - 07/18 0700 In: -  Out: 450 [Urine:450] Intake/Output this shift: Total I/O In: 760 [P.O.:760] Out: 1100 [Urine:1100] Nutritional status: Cardiac  Past Medical History  Diagnosis Date  . MS (multiple sclerosis)   . Bipolar 1 disorder   . Bipolar disorder, unspecified 01/03/2013     Neurologic Exam:  Mental Status: Alert, oriented, thought content appropriate.  Speech fluent without evidence of aphasia.  Able to follow 3 step commands without difficulty. Cranial Nerves: II: Discs flat bilaterally; Visual fields grossly normal, pupils equal, round, reactive to light and accommodation III,IV, VI: ptosis not present, extra-ocular motions intact bilaterally--initially would not move eyes for me but after distracted tracked my movements in the room.  V,VII: smile asymmetric on right, facial light touch sensation decreased on the right VIII: hearing normal bilaterally IX,X: gag reflex present XI: bilateral shoulder shrug XII: midline tongue extension Motor: Right : Upper extremity   4/5 prox>distal   Left:     Upper extremity   5/5  Lower extremity   4/5      Lower extremity    5/5 --strength is dependent on effort.   Tone and bulk:normal tone throughout; no atrophy noted Sensory: Decreased sensation on the right arm and leg Deep Tendon Reflexes:  Right: Upper Extremity   Left: Upper  extremity   biceps (C-5 to C-6) 2/4   biceps (C-5 to C-6) 2/4 tricep (C7) 2/4    triceps (C7) 2/4 Brachioradialis (C6) 2/4  Brachioradialis (C6) 2/4  Lower Extremity Lower Extremity  quadriceps (L-2 to L-4) 2/4   quadriceps (L-2 to L-4) 2/4 Achilles (S1) 2/4   Achilles (S1) 2/4  Plantars: Right: downgoing   Left: downgoing Cerebellar: normal finger-to-nose,  normal heel-to-shin test CV: pulses palpable throughout    Lab Results: No results found for this basename: cbc, bmp, coags, chol, tri, ldl, hga1c   Lipid Panel No results found for this basename: CHOL, TRIG, HDL, CHOLHDL, VLDL, LDLCALC,  in the last 72 hours  Studies/Results: Mr Laqueta Jean Wo Contrast  01/03/2013   *RADIOLOGY REPORT*  Clinical Data: History of multiple sclerosis and bipolar disorder. Recent right-sided weakness and difficulty walking.  MRI HEAD WITHOUT AND WITH CONTRAST  Technique:  Multiplanar, multiecho pulse sequences of the brain and surrounding structures were obtained according to standard protocol without and with intravenous contrast  Contrast: 20mL MULTIHANCE GADOBENATE DIMEGLUMINE 529 MG/ML IV SOLN  Comparison: 03/27/2009 MR.  Findings: Motion degraded exam.  Significant white matter type changes consistent with the patient's history of multiple sclerosis.  Findings have progressed since prior examination.  Two areas demonstrate mild restricted motion as can be seen with areas of active demyelination; 1.1 cm rounded lesion posterior left periventricular white matter and 6 mm lesion posterior right frontal lobe.  None of these areas demonstrate enhancement.  No dominant white matter abnormality with associated mass effect detected (as may be seen with progressive multifocal leukoencephalopathy).  No  acute infarct.  No intracranial hemorrhage.  Major intracranial vascular structures are patent.  Atrophy without hydrocephalus.  Mild prominence soft tissue posterior-superior nasopharynx unchanged and may represent adenoidal tissue.  Cervical medullary junction, pituitary region and pineal region unremarkable.  Mild exophthalmos.  No obvious optic nerve demyelination.  IMPRESSION:  Significant white matter type changes consistent with the patient's history of multiple sclerosis.  Findings have progressed since prior examination.  Two areas demonstrate mild restricted motion as can be seen with areas of active demyelination; 1.1 cm rounded lesion posterior left periventricular white matter and 6 mm lesion posterior right frontal lobe.  None of these areas demonstrate enhancement.  Please see above.   Original Report Authenticated By: Lacy Duverney, M.D.   Dg Chest Port 1 View  01/02/2013   *RADIOLOGY REPORT*  Clinical Data: Flare of multiple sclerosis  PORTABLE CHEST - 1 VIEW  Comparison: Portable exam 1416 hours compared to 05/08/2010  Findings: Normal heart size, mediastinal contours, and pulmonary vascularity. Lungs clear. Bones unremarkable. No pneumothorax.  IMPRESSION: No acute abnormalities.   Original Report Authenticated By: Ulyses Southward, M.D.    MEDICATIONS                                                                                                                        Scheduled: . baclofen  20 mg Oral TID  .  enoxaparin (LOVENOX) injection  40 mg Subcutaneous Q24H  . gabapentin  600 mg Oral TID  . methylPREDNISolone (SOLU-MEDROL) injection  1,000 mg Intravenous Q24H  . 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    Recommend: 1) IV solumedrol 1 gram daily for 3-5 days.  Will follow with you.     Assessment and plan discussed with with attending physician and they are in agreement.    [name] PA-C [facility] [phone]  01/03/2013, 12:24 PM

## 2013-01-03 NOTE — ED Provider Notes (Addendum)
I saw and evaluated the patient, reviewed the resident's note and I agree with the findings and plan. Pt with history of MS and several weeks of progressive R sided weakness. 1/5 strength in RUE/RLE. Neuro to eval and will discuss with [facility] for admission of MS flare  Loren Racer, MD 01/03/13 [phone]  Physical Exam  Constitutional: He is oriented to person, place, and time. He appears distressed.  HENT:  Head: Normocephalic and atraumatic.  Mouth/Throat: Oropharynx is clear and moist. No oropharyngeal exudate.  Eyes: Conjunctivae and EOM are normal. Pupils are equal, round, and reactive to light.  Neck: Normal range of motion.  Cardiovascular: Normal rate and regular rhythm.  Exam reveals no gallop and no friction rub.   No murmur heard. Pulmonary/Chest: Effort normal and breath sounds normal. No respiratory distress. He has no wheezes. He has no rales. He exhibits no tenderness.  Abdominal: Soft. Bowel sounds are normal. He exhibits no distension and no mass. There is no tenderness. There is no rebound and no guarding.  Musculoskeletal: Normal range of motion. He exhibits no edema and no tenderness.  Neurological: He is alert and oriented to person, place, and time. He displays normal reflexes.  1/5 motor in RUE/RLE otherwise 5/5 motor. Sensation to light touch intact. DTR's intact, CN II-XII intact  Skin: Skin is warm and dry. No rash noted. He is not diaphoretic. No erythema. No pallor.  Psychiatric: Affect and judgment normal.    Loren Racer, MD 01/15/13 1550

## 2013-01-03 NOTE — Evaluation (Signed)
Occupational Therapy Evaluation Patient Details Name: Bradley Brandt MRN: [id number] DOB: 1981-05-22 Today's Date: 01/03/2013 Time: 4098-1191 OT Time Calculation (min): 38 min  OT Assessment / Plan / Recommendation History of present illness 32 y.o. male admitted [facility] prison for MS flare and increased R sided weakness, inability to walk.  MRI revealied 1.1 cm rounded lesion posterior left eriventricular white matter and 6 mm lesion posterior right frontal lobe.     Clinical Impression   Pt demonstrated dereased trunk strength in sitting as exhibited by decreased sitting balance.  Per nerology PA his area of flare on MRI should not affect his trunk strength.  Functionally he was able to preform a partial sit up to reposition himself in bed.  His functional presentation is inconsistant throughout exam. When OTS stepped out of room, and Pt forgot OT was in the room, he was able to stabilize sitting EOB demonstrate anetrior trunk lean, use RUE to grasp and bring cup to mouth. Pt able to maintain anterior lean to drink and replace cup. Pt very inconsistant with what he says/tests he can do, and what he does. Pt will benefit from skilled OT in the acute setting to incr independence in ADL, learn energy conservation techniques, and improve balance prior to D/C back to [facility] with HHOT.    OT Assessment  Patient needs continued OT Services    Follow Up Recommendations  Home health OT       Equipment Recommendations  3 in 1 bedside comode       Frequency  Min 2X/week    Precautions / Restrictions Precautions Precautions: Fall Precaution Comments: R hemiperesis upper and lower extremity Restrictions Other Position/Activity Restrictions: pt's legs bil shackeled to the bed.     Pertinent Vitals/Pain Pt reported un-rated pain in lower back    ADL  Eating/Feeding: Set up Where Assessed - Eating/Feeding: Edge of bed Grooming: Teeth care;Wash/dry face;Wash/dry hands;Moderate  assistance Where Assessed - Grooming: Supported sitting Upper Body Bathing: Moderate assistance Where Assessed - Upper Body Bathing: Supported sitting Lower Body Bathing: Maximal assistance Where Assessed - Lower Body Bathing: Supported sitting Upper Body Dressing: Moderate assistance Where Assessed - Upper Body Dressing: Supported sitting Lower Body Dressing: Maximal assistance Where Assessed - Lower Body Dressing: Supported sitting Toilet Transfer: Moderate assistance Toilet Transfer Method: Sit to stand Toilet Transfer Equipment: Raised toilet seat with arms (or 3-in-1 over toilet) Toileting - Clothing Manipulation and Hygiene: Maximal assistance Where Assessed - Toileting Clothing Manipulation and Hygiene: Standing Tub/Shower Transfer: Maximal assistance Tub/Shower Transfer Method: Not assessed Equipment Used: Gait belt;Other (comment) (3 in 1) Transfers/Ambulation Related to ADLs: Pt mod assist with verbal encouragement to sit <> stand. Pt mod assist for ambulation. We had to stop several times and get the Pt to stand up straight and find his middle. Pt with lean to right. ADL Comments: Pt attempted to perform ADL standing at sink, but fatigued and had to sit to complete task. Pt was able to use RUE to hold toothpaste to open, open toothbrush, and retrieve a washcloth he dropped on the floor. Pt with heavy right posterior lean on 3 in 1 with mod vc's to correct balance/posture.    OT Diagnosis: Generalized weakness  OT Problem List: Decreased strength;Decreased range of motion;Impaired balance (sitting and/or standing);Decreased coordination;Impaired UE functional use OT Treatment Interventions: Self-care/ADL training;Therapeutic exercise;Therapeutic activities;Balance training   OT Goals(Current goals can be found in the care plan section) Acute Rehab OT Goals Patient Stated Goal: to go back  to his normal level of function and return to the same block he was in with his "partner"  PTA.   OT Goal Formulation: With patient Time For Goal Achievement: 01/16/13 Potential to Achieve Goals: Fair ADL Goals Pt Will Perform Grooming: with supervision;standing Pt Will Perform Toileting - Clothing Manipulation and hygiene: with supervision;sit to/from stand Additional ADL Goal #1: Pt will be able to recall and demonstrate 2 ways to conserve energy during ADL.  Visit Information  Last OT Received On: 01/03/13 Assistance Needed: +1 History of Present Illness: 32 y.o. male admitted to [facility] from prison for MS flare and increased R sided weakness, inability to walk.  MRI revealied 1.1 cm rounded lesion posterior left eriventricular white matter and 6 mm lesion posterior right frontal lobe.         Prior Functioning     Home Living Family/patient expects to be discharged to:: Dentention/Prison Additional Comments: Pt was in general population until he could not walk and then he was in medical ward x 1 day.   Prior Function Level of Independence: Needs assistance Gait / Transfers Assistance Needed: min assist Comments: per pt his "partner" in prison would give him hand held assist to walk and would bring him his lunch tray to the table.  He is very concerned about returning to prison because he is not sure he will be on the same block as his "partner" who protects him and helps him walk.   Communication Communication: No difficulties Dominant Hand: Right         Vision/Perception Vision - History Baseline Vision: Wears glasses all the time Patient Visual Report: No change from baseline Vision - Assessment Vision Assessment: Vision not tested   Cognition  Cognition Arousal/Alertness: Awake/alert Behavior During Therapy: WFL for tasks assessed/performed Overall Cognitive Status: History of cognitive impairments - at baseline    Extremity/Trunk Assessment Upper Extremity Assessment Upper Extremity Assessment: RUE deficits/detail RUE Deficits / Details: shoulder  abduction cannot go greater than 90 degrees, MMT with general overall weakness 3/5. Pt with slight decr sensation in RUE. However, when OTS stepped out of the room with OT still in the room, the Pt leaned forward and was able to use RUE to grasp full cup of water, take a sip, and place the cup back on the tray. RUE Sensation: decreased light touch RUE Coordination: decreased fine motor;decreased gross motor Lower Extremity Assessment Lower Extremity Assessment: Defer to PT evaluation RLE Deficits / Details: right ankle 3-/5, right knee ext 3-/5, right knee flexion 4/5, hip NT, but functionally pt is able to lift hip against gravity to bring leg to EOB without external assistance, so functionally I would rate him at 3-/5.   RLE Sensation: decreased light touch RLE Coordination: decreased fine motor;decreased gross motor Cervical / Trunk Assessment Cervical / Trunk Assessment: Other exceptions Cervical / Trunk Exceptions: Pt demonstrated dereased trunk strength in sitting as exhibited by decreased sitting balance.  Per nerology PA his area of flare on MRI should not affect his trunk strength.  Functionally he was able to preform a partial sit up to reposition himself in bed.  His functional presentation is inconsistant throughout exam. When OTS stepped out of room, and Pt forgot OT was in the room, he was able to stabilize sitting EOB demonstrate anetrior trunk lean, use RUE to grasp and bring cup to mouth. Pt able to maintain anterior lean to drink and replace cup. Pt very inconsistant with what he says/tests he can do, and what he  does.      Mobility Bed Mobility Bed Mobility: Supine to Sit;Sitting - Scoot to Edge of Bed;Sit to Supine Supine to Sit: 4: Min guard;HOB elevated;With rails Sitting - Scoot to Edge of Bed: 4: Min guard;With rail Sit to Supine: 5: Supervision;With rail;HOB flat Details for Bed Mobility Assistance: min guard assist to support trunk to prevent LOB posteriorly.    Transfers Transfers: Sit to Stand;Stand to Sit Sit to Stand: From elevated surface;With upper extremity assist;From bed;4: Min assist;From chair/3-in-1 Stand to Sit: With upper extremity assist;To bed;4: Min assist;To chair/3-in-1 Details for Transfer Assistance: Pt min assist for transfers with max vc's for sequencing. Pt leaning to the right with max vc's to correct and stand up straight.        Balance Static Sitting Balance Static Sitting - Balance Support: Left upper extremity supported;Feet supported Static Sitting - Level of Assistance: 4: Min assist Static Sitting - Comment/# of Minutes: 10 mins EOB while PT/PA preformed exam.  LOB x 2 posteriorly requiring min assist to prevent trunk from going back to bed or to help pt back to sitting from semi supine position.   Static Standing Balance Static Standing - Balance Support: No upper extremity supported Static Standing - Level of Assistance: 3: Mod assist Static Standing - Comment/# of Minutes: mod assist to stand x 2 ~30-45 sec with right leg blocked.  Bed elevated and pt relying heavily on bil lower extremity support on bed for balance and stability.  Left and posterior lean.     End of Session OT - End of Session Equipment Utilized During Treatment: Gait belt Activity Tolerance: Patient tolerated treatment well Patient left: in bed;with call bell/phone within reach;Other (comment) (with guard) Nurse Communication: Mobility status  GO     Sherryl Manges 01/03/2013, 3:25 PM

## 2013-01-03 NOTE — Evaluation (Signed)
I agree with the following treatment note after reviewing documentation.   Johnston, Morgen Ritacco Brynn   OTR/L Pager: [phone] Office: [phone] .   

## 2013-01-03 NOTE — Progress Notes (Signed)
I agree with the following treatment note after reviewing documentation.   Johnston, Haly Feher Brynn   OTR/L Pager: [phone] Office: [phone] .   

## 2013-01-04 LAB — BASIC METABOLIC PANEL
CO2: 23 mEq/L (ref 19–32)
Calcium: 9.1 mg/dL (ref 8.4–10.5)
GFR calc non Af Amer: >90 mL/min (ref >90)
Sodium: 138 mEq/L (ref 135–145)

## 2013-01-04 NOTE — Progress Notes (Signed)
Pt became very agitated tonight when asking about his Rebif.  He wants to know why it is not being administered to him while in the hospital, claiming that "these doctors and nurses don't know what they're doing".  Will pass on to day shift.

## 2013-01-04 NOTE — Progress Notes (Signed)
[facility] PROGRESS NOTE  Bradley Brandt HYQ:[id number] DOB: [birth year] DOA: 01/02/2013 PCP: No primary provider on file.  Assessment/Plan: #1 right-sided weakness/progressive right hemiparesis Likely secondary to multiple sclerosis flare versus acute CVA. MRI of the head neg for infarct and c/w MS. Continue IV Solu-Medrol per neurology recommendations. Neurology is following and appreciate input and recommendations.  #2 elevated blood pressure Patient denies any history of hypertension. Bp improved. We'll follow patient's blood pressures. If still elevated will start patient on antihypertensive medication.  #3 bipolar disorder Stable. Outpatient followup.  #4 MS See problem #1.  Code Status: Full Family Communication: Updated patient no family at bedside. Disposition Plan: Back to jail when medically stable.   Consultants:  Neurology: Dr. Cyril Mourning 01/02/2013  Procedures:  MRI head pending  Chest x-ray 01/02/2013  Antibiotics:  None  HPI/Subjective: Patient with complaints of right sided weakness with no significant improvement.  Objective: Filed Vitals:   January 15, 2013 1434 2013-01-15 2200 01/04/13 0555 01/04/13 1346  BP: 125/57 156/89 149/79 124/72  Pulse: 86 72 70 56  Temp: 97.7 F (36.5 C) 98.2 F (36.8 C) 98 F (36.7 C) 98 F (36.7 C)  TempSrc: Oral Oral Oral Oral  Resp: 16 18 18 18   Height:      Weight:      SpO2: 96% 98% 98% 98%    Intake/Output Summary (Last 24 hours) at 01/04/13 1725 Last data filed at 01/04/13 1340  Gross per 24 hour  Intake      0 ml  Output   1875 ml  Net  -1875 ml   Filed Weights   01/02/13 2106  Weight: 98.9 kg (218 lb 0.6 oz)    Exam:   General:  NAD  Cardiovascular: RRR  Respiratory: CTAB  Abdomen: sOFT/nt/nd/+bs  Musculoskeletal: 3-4/5 RUE strength, 3/5 RLE strength.  Data Reviewed: Basic Metabolic Panel:  Recent Labs Lab 01/02/13 1351 January 15, 2013 0435 01/04/13 0540  NA 140 137 138  K 4.2 4.1 4.5   CL 105 101 108  CO2 29 26 23   GLUCOSE 85 158* 144*  BUN 11 14 13   CREATININE 1.03 0.86 0.81  CALCIUM 9.6 9.4 9.1   Liver Function Tests: No results found for this basename: AST, ALT, ALKPHOS, BILITOT, PROT, ALBUMIN,  in the last 168 hours No results found for this basename: LIPASE, AMYLASE,  in the last 168 hours No results found for this basename: AMMONIA,  in the last 168 hours CBC:  Recent Labs Lab 01/02/13 1351 01/15/2013 0435  WBC 3.4* 5.6  NEUTROABS 1.4*  --   HGB 14.9 14.8  HCT 40.9 40.9  MCV 89.9 89.1  PLT 149* 165   Cardiac Enzymes: No results found for this basename: CKTOTAL, CKMB, CKMBINDEX, TROPONINI,  in the last 168 hours BNP (last 3 results) No results found for this basename: PROBNP,  in the last 8760 hours CBG:  Recent Labs Lab 15-Jan-2013 1113 01/15/2013 1657 01/04/13 0649 01/04/13 1110 01/04/13 1637  GLUCAP 153* 131* 156* 133* 138*    No results found for this or any previous visit (from the past 240 hour(s)).   Studies: Mr Laqueta Jean XB Contrast  2013-01-15   *RADIOLOGY REPORT*  Clinical Data: History of multiple sclerosis and bipolar disorder. Recent right-sided weakness and difficulty walking.  MRI HEAD WITHOUT AND WITH CONTRAST  Technique:  Multiplanar, multiecho pulse sequences of the brain and surrounding structures were obtained according to standard protocol without and with intravenous contrast  Contrast: 20mL MULTIHANCE GADOBENATE DIMEGLUMINE 529 MG/ML IV  SOLN  Comparison: 03/27/2009 MR.  Findings: Motion degraded exam.  Significant white matter type changes consistent with the patient's history of multiple sclerosis.  Findings have progressed since prior examination.  Two areas demonstrate mild restricted motion as can be seen with areas of active demyelination; 1.1 cm rounded lesion posterior left periventricular white matter and 6 mm lesion posterior right frontal lobe.  None of these areas demonstrate enhancement.  No dominant white matter  abnormality with associated mass effect detected (as may be seen with progressive multifocal leukoencephalopathy).  No acute infarct.  No intracranial hemorrhage.  Major intracranial vascular structures are patent.  Atrophy without hydrocephalus.  Mild prominence soft tissue posterior-superior nasopharynx unchanged and may represent adenoidal tissue.  Cervical medullary junction, pituitary region and pineal region unremarkable.  Mild exophthalmos.  No obvious optic nerve demyelination.  IMPRESSION:  Significant white matter type changes consistent with the patient's history of multiple sclerosis.  Findings have progressed since prior examination.  Two areas demonstrate mild restricted motion as can be seen with areas of active demyelination; 1.1 cm rounded lesion posterior left periventricular white matter and 6 mm lesion posterior right frontal lobe.  None of these areas demonstrate enhancement.  Please see above.   Original Report Authenticated By: Lacy Duverney, M.D.    Scheduled Meds: . baclofen  20 mg Oral TID  . enoxaparin (LOVENOX) injection  40 mg Subcutaneous Q24H  . gabapentin  600 mg Oral TID  . methylPREDNISolone (SOLU-MEDROL) injection  1,000 mg Intravenous Q24H  . sodium chloride  3 mL Intravenous Q12H   Continuous Infusions: . sodium chloride 75 mL/hr at 01/04/13 0414    Principal Problem:   Right hemiparesis Active Problems:   Multiple sclerosis flare   Bipolar disorder, unspecified    Time spent: > 35 mins    [facility]  [facility] Pager [phone]. If 7PM-7AM, please contact night-coverage at [email or web address], password [facility] 01/04/2013, 5:25 PM  LOS: 2 days

## 2013-01-05 LAB — BASIC METABOLIC PANEL
CO2: 26 mEq/L (ref 19–32)
Chloride: 104 mEq/L (ref 96–112)
Glucose, Bld: 135 mg/dL — ABNORMAL HIGH (ref 70–99)
Potassium: 4.4 mEq/L (ref 3.5–5.1)
Sodium: 138 mEq/L (ref 135–145)

## 2013-01-05 LAB — GLUCOSE, CAPILLARY: Glucose-Capillary: 187 mg/dL — ABNORMAL HIGH (ref 70–99)

## 2013-01-05 MED ORDER — INTERFERON BETA-1A 44 MCG/0.5ML ~~LOC~~ SOLN
44.0000 ug | SUBCUTANEOUS | Status: DC
  Administered 2013-01-06 – 2013-01-08 (×2): 44 ug via SUBCUTANEOUS
  Filled 2013-01-05 (×3): qty 0.5

## 2013-01-05 NOTE — Progress Notes (Signed)
[facility] PROGRESS NOTE  Bradley Brandt NFA:[id number] DOB: 04-Oct-1980 DOA: 01/02/2013 PCP: No primary provider on file.  Assessment/Plan: #1 right-sided weakness/progressive right hemiparesis Likely secondary to multiple sclerosis flare. MRI of the head neg for infarct and c/w MS. Continue IV Solu-Medrol per neurology recommendations. Neurology is following and appreciate input and recommendations.  #2 elevated blood pressure Patient denies any history of hypertension. Bp improved. Follow.  #3 bipolar disorder Stable. Outpatient followup.  #4 MS See problem #1.  Code Status: Full Family Communication: Updated patient no family at bedside. Disposition Plan: Back to jail when medically stable.   Consultants:  Neurology: Dr. Cyril Mourning 01/02/2013  Procedures:  MRI head pending  Chest x-ray 01/02/2013  Antibiotics:  None  HPI/Subjective: Patient with complaints of right sided weakness with no significant improvement.  Objective: Filed Vitals:   01/04/13 1346 01/04/13 2200 01/05/13 1000 01/05/13 1414  BP: 124/72 116/57 128/64 129/64  Pulse: 56 49 49 50  Temp: 98 F (36.7 C) 98.1 F (36.7 C) 98 F (36.7 C) 98.1 F (36.7 C)  TempSrc: Oral  Oral Oral  Resp: 18 18 18 18   Height:      Weight:      SpO2: 98% 97% 97% 96%    Intake/Output Summary (Last 24 hours) at 01/05/13 1447 Last data filed at 01/05/13 0900  Gross per 24 hour  Intake    360 ml  Output    725 ml  Net   -365 ml   Filed Weights   01/02/13 2106  Weight: 98.9 kg (218 lb 0.6 oz)    Exam:   General:  NAD  Cardiovascular: RRR  Respiratory: CTAB  Abdomen: sOFT/nt/nd/+bs  Musculoskeletal: 3-4/5 RUE strength, 4/5 RLE strength.  Data Reviewed: Basic Metabolic Panel:  Recent Labs Lab 01/02/13 1351 01/03/13 0435 01/04/13 0540 01/05/13 0510  NA 140 137 138 138  K 4.2 4.1 4.5 4.4  CL 105 101 108 104  CO2 29 26 23 26   GLUCOSE 85 158* 144* 135*  BUN 11 14 13 15   CREATININE  1.03 0.86 0.81 0.74  CALCIUM 9.6 9.4 9.1 8.7   Liver Function Tests: No results found for this basename: AST, ALT, ALKPHOS, BILITOT, PROT, ALBUMIN,  in the last 168 hours No results found for this basename: LIPASE, AMYLASE,  in the last 168 hours No results found for this basename: AMMONIA,  in the last 168 hours CBC:  Recent Labs Lab 01/02/13 1351 01/03/13 0435  WBC 3.4* 5.6  NEUTROABS 1.4*  --   HGB 14.9 14.8  HCT 40.9 40.9  MCV 89.9 89.1  PLT 149* 165   Cardiac Enzymes: No results found for this basename: CKTOTAL, CKMB, CKMBINDEX, TROPONINI,  in the last 168 hours BNP (last 3 results) No results found for this basename: PROBNP,  in the last 8760 hours CBG:  Recent Labs Lab 01/04/13 1110 01/04/13 1637 01/04/13 2151 01/05/13 0702 01/05/13 1123  GLUCAP 133* 138* 187* 139* 169*    No results found for this or any previous visit (from the past 240 hour(s)).   Studies: No results found.  Scheduled Meds: . baclofen  20 mg Oral TID  . enoxaparin (LOVENOX) injection  40 mg Subcutaneous Q24H  . gabapentin  600 mg Oral TID  . [START ON 01/06/2013] interferon beta-1a  44 mcg Subcutaneous 3 times weekly  . methylPREDNISolone (SOLU-MEDROL) injection  1,000 mg Intravenous Q24H  . sodium chloride  3 mL Intravenous Q12H   Continuous Infusions:    Principal Problem:  Multiple sclerosis flare Active Problems:   Right hemiparesis   Bipolar disorder, unspecified    Time spent: > 35 mins    [facility]  [facility] Pager [phone]. If 7PM-7AM, please contact night-coverage at [email or web address], password [email or web address] 01/05/2013, 2:47 PM  LOS: 3 days

## 2013-01-05 NOTE — Progress Notes (Signed)
NEURO HOSPITALIST PROGRESS NOTE   SUBJECTIVE:                                                                                                                        Reports " some improvement of the right arm but no change in my right leg". IV solumedrol day 3 today. PT help appreciated. No new neurological complains. Stated that his Rebif schedule is M-W-F. MRI brain revealed evidence of progressive demyelinating disease and 2 new areas of probable active demyelination.  OBJECTIVE:                                                                                                                           Vital signs in last 24 hours: Temp:  [98 F (36.7 C)-98.1 F (36.7 C)] 98 F (36.7 C) (07/20 1000) Pulse Rate:  [49-56] 49 (07/20 1000) Resp:  [18] 18 (07/20 1000) BP: (116-128)/(57-72) 128/64 mmHg (07/20 1000) SpO2:  [97 %-98 %] 97 % (07/20 1000)  Intake/Output from previous day: 07/19 0701 - 07/20 0700 In: -  Out: 1450 [Urine:1450] Intake/Output this shift: Total I/O In: 360 [P.O.:360] Out: 400 [Urine:400] Nutritional status: Cardiac  Past Medical History  Diagnosis Date  . MS (multiple sclerosis)   . Bipolar 1 disorder   . Bipolar disorder, unspecified 01/03/2013    Neurologic ROS negative with exception of above.   Neurologic Exam:  Mental Status:  Alert, oriented, thought content appropriate. Speech fluent without evidence of aphasia. Able to follow 3 step commands without difficulty.  Cranial Nerves:  II: Discs flat bilaterally; Visual fields grossly normal, pupils equal, round, reactive to light and accommodation  III,IV, VI: ptosis not present, extra-ocular motions intact bilaterally--initially would not move eyes for me but after distracted tracked my movements in the room.  V,VII: smile asymmetric on right, facial light touch sensation decreased on the right  VIII: hearing normal bilaterally  IX,X: gag reflex present   XI: bilateral shoulder shrug  XII: midline tongue extension  Motor:  Right : Upper extremity 4/5 prox>distal Left: Upper extremity 5/5  Lower extremity 4/5 Lower extremity 5/5  --strength is dependent on effort.  Tone and bulk:normal tone throughout; no atrophy noted  Sensory: Decreased sensation on the right  arm and leg  Deep Tendon Reflexes:  Right: Upper Extremity Left: Upper extremity  biceps (C-5 to C-6) 2/4 biceps (C-5 to C-6) 2/4  tricep (C7) 2/4 triceps (C7) 2/4  Brachioradialis (C6) 2/4 Brachioradialis (C6) 2/4  Lower Extremity Lower Extremity  quadriceps (L-2 to L-4) 2/4 quadriceps (L-2 to L-4) 2/4  Achilles (S1) 2/4 Achilles (S1) 2/4  Plantars:  Right: downgoing Left: downgoing  Cerebellar:  normal finger-to-nose, normal heel-to-shin test  CV: pulses palpable throughout        Lab Results: No results found for this basename: cbc, bmp, coags, chol, tri, ldl, hga1c   Lipid Panel No results found for this basename: CHOL, TRIG, HDL, CHOLHDL, VLDL, LDLCALC,  in the last 72 hours  Studies/Results: No results found.  MEDICATIONS                                                                                                                       I have reviewed the patient's current medications.  ASSESSMENT/PLAN:                                                                                                            MS relapse on IV solumedrol, day # 3. No remarkable improvement although I am afraid he is not providing full effort on testing of muscle strength. Will suggest 5 days of IV solumedrol. It is unclear to me if he was receiving Rebif in prison, but will resume his schedule tomorrow. Will continue to follow.   Bradley Portela, MD [facility] [phone]  01/05/2013, 11:22 AM

## 2013-01-06 LAB — BASIC METABOLIC PANEL
BUN: 16 mg/dL (ref 6–23)
GFR calc non Af Amer: >90 mL/min (ref >90)
Glucose, Bld: 137 mg/dL — ABNORMAL HIGH (ref 70–99)
Potassium: 4 mEq/L (ref 3.5–5.1)

## 2013-01-06 LAB — GLUCOSE, CAPILLARY: Glucose-Capillary: 148 mg/dL — ABNORMAL HIGH (ref 70–99)

## 2013-01-06 MED ORDER — SODIUM CHLORIDE 0.9 % IV SOLN
1000.0000 mg | Freq: Every day | INTRAVENOUS | Status: DC
  Administered 2013-01-06: 1000 mg via INTRAVENOUS
  Filled 2013-01-06 (×3): qty 8

## 2013-01-06 MED ORDER — IBUPROFEN 400 MG PO TABS
400.0000 mg | ORAL_TABLET | Freq: Four times a day (QID) | ORAL | Status: DC | PRN
  Administered 2013-01-06 – 2013-01-08 (×2): 400 mg via ORAL
  Filled 2013-01-06 (×2): qty 1

## 2013-01-06 NOTE — Progress Notes (Signed)
Physical Therapy Treatment Patient Details Name: Bradley Brandt MRN: [id number] DOB: [birth year] Today's Date: 01/06/2013 Time: 1191-4782 PT Time Calculation (min): 24 min  PT Assessment / Plan / Recommendation  History of Present Illness 32 y.o. male admitted to [facility] from prison for MS flare and increased R sided weakness, inability to walk.  MRI revealied 1.1 cm rounded lesion posterior left eriventricular white matter and 6 mm lesion posterior right frontal lobe.     Clinical Impression Pt with significant improvement, although still requiring up to mod assist to ambulate due to excessive posterior lean. Pt's symptoms of dizziness include a sense of movement that dissipates when he remains stil--sounds as if he may have a vestibular component to his dizziness. Will need to further assess.   PT Comments     Follow Up Recommendations  Other (comment) (pt may need PT f/u at discharge depending on progress )     Does the patient have the potential to tolerate intense rehabilitation     Barriers to Discharge        Equipment Recommendations  Other (comment) (TBD)    Recommendations for Other Services    Frequency Min 3X/week   Progress towards PT Goals Progress towards PT goals: Progressing toward goals  Plan Current plan remains appropriate    Precautions / Restrictions Precautions Precautions: Fall Precaution Comments: R hemiperesis upper and lower extremity Restrictions Other Position/Activity Restrictions: pt's legs bil shackeled to the bed.     Pertinent Vitals/Pain Reported stomach is sore from working with OT earlier    Mobility  Bed Mobility Bed Mobility: Supine to Sit;Sitting - Scoot to Edge of Bed;Sit to Supine Supine to Sit: 4: Min guard;HOB flat Sitting - Scoot to Edge of Bed: 4: Min guard;With rail Sit to Supine: HOB flat;5: Supervision Details for Bed Mobility Assistance: min guard assist to support trunk to prevent LOB posteriorly.   Transfers Transfers: Sit  to Stand;Stand to Sit Sit to Stand: With upper extremity assist;From bed;4: Min assist Stand to Sit: 3: Mod assist;With upper extremity assist;To bed Details for Transfer Assistance: Pt required cues for safe use of RW. Pt continues with posterior lean as completing sit to stand (he goes into excessive trunk and hip extension--can correct with light tactile and verbal cues). repeated transfer sit to stand x 5 with pushing off surface vs his thighs. Controlled descent most difficult  Ambulation/Gait Ambulation/Gait Assistance: 3: Mod assist (due to not safe without second person to assist.  ) Ambulation Distance (Feet): 16 Feet (6; 10) Assistive device: Rolling walker Ambulation/Gait Assistance Details: Pt advancing and controling RLE/knee without physical assist. Requires cues to stay weight-shifted forward over the balls of his feet and to push down through his UEs onto RW (assists with forward wt-shift). Assis to turn RW. Gait Pattern: Step-through pattern;Decreased stride length;Decreased weight shift to right;Right foot flat;Narrow base of support    Exercises General Exercises - Lower Extremity Heel Raises: AAROM;Both;5 reps;Standing Mini-Sqauts: AAROM;Both;5 reps;Standing Other Exercises Other Exercises: stepping forward and backward with each foot with RW x 3;    PT Diagnosis:    PT Problem List:   PT Treatment Interventions:     PT Goals (current goals can now be found in the care plan section) Acute Rehab PT Goals Patient Stated Goal: to go back to his normal level of function and return to the same block he was in with his "partner" PTA.   PT Goal Formulation: With patient Time For Goal Achievement: 01/17/13 Potential to Achieve  Goals: Good  Visit Information  Last PT Received On: 01/06/13 Assistance Needed: +1 History of Present Illness: 32 y.o. male admitted to [facility] from prison for MS flare and increased R sided weakness, inability to walk.  MRI revealied 1.1 cm rounded  lesion posterior left eriventricular white matter and 6 mm lesion posterior right frontal lobe.      Subjective Data  Subjective: Reports he gets dizzy with all movement; things look "wavy" and he feels off-balance; reports if he stays still, it will go away Patient Stated Goal: to go back to his normal level of function and return to the same block he was in with his "partner" PTA.     Cognition  Cognition Arousal/Alertness: Awake/alert Behavior During Therapy: WFL for tasks assessed/performed Overall Cognitive Status:  (bipolar disorder)    Balance  Static Sitting Balance Static Sitting - Balance Support: Feet supported Static Sitting - Level of Assistance: 4: Min assist Static Sitting - Comment/# of Minutes: pt leans posteriorly with distraction or fatigue; only requires vc's to light tactile cues to return to upright/midline Static Standing Balance Static Standing - Balance Support: Bilateral upper extremity supported Static Standing - Level of Assistance: 4: Min assist Static Standing - Comment/# of Minutes: stood with RW for up to 60 seconds  End of Session PT - End of Session Equipment Utilized During Treatment: Gait belt Activity Tolerance: Patient limited by fatigue Patient left: in bed;with call bell/phone within reach;Other (comment) (with guard present, reshackeled to the bed.  )   GP     Bradley Brandt 01/06/2013, 4:32 PM Pager [phone]

## 2013-01-06 NOTE — Progress Notes (Signed)
[facility] PROGRESS NOTE  Bradley Brandt:[id number] DOB: Nov 29, 1980 DOA: 01/02/2013 PCP: No primary provider on file.  Assessment/Plan: #1 right-sided weakness/progressive right hemiparesis Likely secondary to multiple sclerosis flare. MRI of the head neg for infarct and c/w MS. Continue IV Solu-Medrol per neurology recommendations. Neurology is following and appreciate input and recommendations.  #2 elevated blood pressure Patient denies any history of hypertension. Bp improved. Follow.  #3 bipolar disorder Stable. Outpatient followup.  #4 MS See problem #1.  Code Status: Full Family Communication: Updated patient no family at bedside. Disposition Plan: Back to jail when medically stable.   Consultants:  Neurology: Dr. Cyril Mourning 01/02/2013  Procedures:  MRI head 01/03/13  Chest x-ray 01/02/2013  Antibiotics:  None  HPI/Subjective: Patient with slight improvement in RLE. No upper extremity improvement.  Objective: Filed Vitals:   01/05/13 1801 01/05/13 2200 01/06/13 0600 01/06/13 1400  BP: 133/64 146/71 151/74 143/73  Pulse: 58 96 40 86  Temp: 97.7 F (36.5 C) 98 F (36.7 C) 97.9 F (36.6 C) 98 F (36.7 C)  TempSrc: Oral   Oral  Resp: 18 20 18 18   Height:      Weight:      SpO2: 96% 97% 94% 99%    Intake/Output Summary (Last 24 hours) at 01/06/13 1445 Last data filed at 01/06/13 0400  Gross per 24 hour  Intake    360 ml  Output   1675 ml  Net  -1315 ml   Filed Weights   01/02/13 2106  Weight: 98.9 kg (218 lb 0.6 oz)    Exam:   General:  NAD  Cardiovascular: RRR  Respiratory: CTAB  Abdomen: sOFT/nt/nd/+bs  Musculoskeletal: 3-4/5 RUE strength, 4/5 RLE strength.  Data Reviewed: Basic Metabolic Panel:  Recent Labs Lab 01/02/13 1351 01/03/13 0435 01/04/13 0540 01/05/13 0510 01/06/13 0530  NA 140 137 138 138 140  K 4.2 4.1 4.5 4.4 4.0  CL 105 101 108 104 104  CO2 29 26 23 26 29   GLUCOSE 85 158* 144* 135* 137*  BUN 11 14  13 15 16   CREATININE 1.03 0.86 0.81 0.74 0.79  CALCIUM 9.6 9.4 9.1 8.7 8.6   Liver Function Tests: No results found for this basename: AST, ALT, ALKPHOS, BILITOT, PROT, ALBUMIN,  in the last 168 hours No results found for this basename: LIPASE, AMYLASE,  in the last 168 hours No results found for this basename: AMMONIA,  in the last 168 hours CBC:  Recent Labs Lab 01/02/13 1351 01/03/13 0435  WBC 3.4* 5.6  NEUTROABS 1.4*  --   HGB 14.9 14.8  HCT 40.9 40.9  MCV 89.9 89.1  PLT 149* 165   Cardiac Enzymes: No results found for this basename: CKTOTAL, CKMB, CKMBINDEX, TROPONINI,  in the last 168 hours BNP (last 3 results) No results found for this basename: PROBNP,  in the last 8760 hours CBG:  Recent Labs Lab 01/05/13 1123 01/05/13 1556 01/05/13 2158 01/06/13 0659 01/06/13 1134  GLUCAP 169* 140* 189* 148* 188*    No results found for this or any previous visit (from the past 240 hour(s)).   Studies: No results found.  Scheduled Meds: . baclofen  20 mg Oral TID  . enoxaparin (LOVENOX) injection  40 mg Subcutaneous Q24H  . gabapentin  600 mg Oral TID  . interferon beta-1a  44 mcg Subcutaneous 3 times weekly  . sodium chloride  3 mL Intravenous Q12H   Continuous Infusions:    Principal Problem:   Multiple sclerosis flare Active  Problems:   Right hemiparesis   Bipolar disorder, unspecified    Time spent: > 35 mins    [facility]  [facility] Pager [phone]. If 7PM-7AM, please contact night-coverage at [email or web address], password [facility] 01/06/2013, 2:45 PM  LOS: 4 days

## 2013-01-06 NOTE — Progress Notes (Signed)
Occupational Therapy Treatment Patient Details Name: Bradley Brandt MRN: [id number] DOB: 01/23/1981 Today's Date: 01/06/2013 Time: 4098-1191 OT Time Calculation (min): 28 min  OT Assessment / Plan / Recommendation  History of present illness 32 y.o. male admitted to [facility] from prison for MS flare and increased R sided weakness, inability to walk.  MRI revealied 1.1 cm rounded lesion posterior left eriventricular white matter and 6 mm lesion posterior right frontal lobe.     Clinical Impression Pt with improving trunk control and Rt. UE function.  Pt continues with inconsistencies with performance.  He requires mod A for LB ADLs due to balance, and will likely need [facility] with f/u OT.      Follow Up Recommendations  Home health OT    Barriers to Discharge       Equipment Recommendations  3 in 1 bedside comode    Recommendations for Other Services    Frequency Min 2X/week   Progress towards OT Goals Progress towards OT goals: Progressing toward goals  Plan Discharge plan remains appropriate    Precautions / Restrictions Precautions Precautions: Fall Precaution Comments: R hemiperesis upper and lower extremity Restrictions Other Position/Activity Restrictions: pt's legs bil shackeled to the bed.         ADL  Toilet Transfer: Moderate assistance Toilet Transfer Method: Sit to stand;Stand pivot Toilet Transfer Equipment: Bedside commode Transfers/Ambulation Related to ADLs: min A sit to stand and min - mod A to maintain standing (variable performance) ADL Comments: Performed reaching activities EOB    OT Diagnosis:    OT Problem List:   OT Treatment Interventions:     OT Goals(current goals can now be found in the care plan section) Acute Rehab OT Goals Patient Stated Goal: to go back to his normal level of function and return to the same block he was in with his "partner" PTA.   OT Goal Formulation: With patient Time For Goal  Achievement: 01/16/13 Potential to Achieve Goals: Fair ADL Goals Pt Will Perform Grooming: with supervision;standing Pt Will Perform Toileting - Clothing Manipulation and hygiene: with supervision;sit to/from stand Additional ADL Goal #1: Pt will be able to recall and demonstrate 2 ways to conserve energy during ADL.  Visit Information  Last OT Received On: 01/06/13 Assistance Needed: +1 History of Present Illness: 32 y.o. male admitted to [facility] from prison for MS flare and increased R sided weakness, inability to walk.  MRI revealied 1.1 cm rounded lesion posterior left eriventricular white matter and 6 mm lesion posterior right frontal lobe.      Subjective Data      Prior Functioning       Cognition  Cognition Arousal/Alertness: Awake/alert Behavior During Therapy: WFL for tasks assessed/performed Overall Cognitive Status: History of cognitive impairments - at baseline    Mobility  Bed Mobility Bed Mobility: Supine to Sit;Sitting - Scoot to Edge of Bed;Sit to Supine Supine to Sit: 4: Min guard;HOB flat;With rails Sitting - Scoot to Edge of Bed: 4: Min guard;With rail Sit to Supine: 4: Min guard;With rail;HOB flat Details for Bed Mobility Assistance: step by step cues for sequencing.  Pt leans posteriorly with all transitions Transfers Transfers: Sit to Stand;Stand to Sit Sit to Stand: With upper extremity assist;From bed;4: Min assist;3: Mod assist Stand to Sit: 3: Mod assist;4: Min assist;With upper extremity assist;To bed Details for Transfer Assistance: Pt with variable performance.  Requires mod A sit to stand if using bedrail, but min A if holding onto therapist's  hand.  Leans posteriorly.  requires verbal and light tactile cuing to facilitate trunk and hip flexion      Exercises  General Exercises - Lower Extremity Heel Raises: AAROM;Both;5 reps;Standing Mini-Sqauts: AAROM;Both;5 reps;Standing Other Exercises Other Exercises: stepping forward and backward with each  foot with RW x 3;  Other Exercises: AAROM Rt. shoulder flexion x 10   Balance Balance Balance Assessed: Yes Static Sitting Balance Static Sitting - Balance Support: Feet supported Static Sitting - Level of Assistance: 5: Stand by assistance Static Sitting - Comment/# of Minutes: pt leans posteriorly, but able to correct back into full erect/upright sitting, before fully losing balance Dynamic Sitting Balance Dynamic Sitting - Balance Support: Feet supported Dynamic Sitting - Level of Assistance: 5: Stand by assistance Dynamic Sitting Balance - Compensations: Pt leans posteriorly, kicking legs out into extension.  verbal cues provided to correct trunk.  Pt then will spontaneoulsy lean forward to shift hips on the bed, then lean posteriorly again.  Reach (Patient is able to reach ___ inches to right, left, forward, back): Reaching with Rt. UE to ~3" off BOS, and rotate to Rt.  Dynamic Sitting - Balance Activities: Reaching for objects Static Standing Balance Static Standing - Balance Support: Left upper extremity supported Static Standing - Level of Assistance: 4: Min assist;3: Mod assist Static Standing - Comment/# of Minutes: variable performance leans Posteriorly - rocking back on heals with excessive hip extension    End of Session OT - End of Session Activity Tolerance: Patient tolerated treatment well Patient left: in bed;with call bell/phone within reach;Other (comment) Teacher, early years/pre in room) Nurse Communication: Mobility status  GO     Leoncio Hansen, Ursula Alert M 01/06/2013, 5:13 PM

## 2013-01-06 NOTE — Progress Notes (Signed)
   CARE MANAGEMENT NOTE 01/06/2013  Patient:  KATAI, MARSICO   Account Number:  [id number]  Date Initiated:  01/06/2013  Documentation initiated by:  Jiles Crocker  Subjective/Objective Assessment:   ADMITTED WITH RIGHT SIDED WEAKNESS     Action/Plan:   incarcerated x 2 months. PATIENT TO RETURN TO JAIL AT DISCHARGE WHEN STABLE   Anticipated DC Date:  01/09/2013   Anticipated DC Plan:  [facility] FAC[facility]      DC Planning Services  CM consult        Status of service:  In process, will continue to follow Medicare Important Message given?  NA - LOS <3 / Initial given by admissions (If response is "NO", the following Medicare IM given date fields will be blank)  Per UR Regulation:  Reviewed for med. necessity/level of care/duration of stay  Comments:  01/06/2013- B Davie Sagona RN,BSN,MHA

## 2013-01-07 DIAGNOSIS — R030 Elevated blood-pressure reading, without diagnosis of hypertension: Secondary | ICD-10-CM

## 2013-01-07 LAB — GLUCOSE, CAPILLARY
Glucose-Capillary: 145 mg/dL — ABNORMAL HIGH (ref 70–99)
Glucose-Capillary: 147 mg/dL — ABNORMAL HIGH (ref 70–99)

## 2013-01-07 MED ORDER — BACLOFEN 20 MG PO TABS: 20.0000 mg | ORAL_TABLET | Freq: Three times a day (TID) | ORAL | Status: DC

## 2013-01-07 MED ORDER — MECLIZINE HCL 25 MG PO TABS
25.0000 mg | ORAL_TABLET | Freq: Three times a day (TID) | ORAL | Status: DC | PRN
  Filled 2013-01-07: qty 1

## 2013-01-07 MED ORDER — GABAPENTIN 600 MG PO TABS: 600.0000 mg | ORAL_TABLET | Freq: Three times a day (TID) | ORAL | Status: DC

## 2013-01-07 MED ORDER — IBUPROFEN 400 MG PO TABS: 400.0000 mg | ORAL_TABLET | Freq: Four times a day (QID) | ORAL | Status: DC | PRN

## 2013-01-07 MED ORDER — DOCUSATE SODIUM 100 MG PO CAPS
200.0000 mg | ORAL_CAPSULE | Freq: Two times a day (BID) | ORAL | Status: DC
  Administered 2013-01-08: 200 mg via ORAL
  Filled 2013-01-07 (×2): qty 1

## 2013-01-07 MED ORDER — SENNA 8.6 MG PO TABS: 2.0000 | ORAL_TABLET | Freq: Once | ORAL | Status: DC

## 2013-01-07 MED ORDER — MECLIZINE HCL 25 MG PO TABS: 25.0000 mg | ORAL_TABLET | Freq: Three times a day (TID) | ORAL | Status: DC | PRN

## 2013-01-07 MED ORDER — INTERFERON BETA-1A 44 MCG/0.5ML ~~LOC~~ SOLN: 44.0000 ug | SUBCUTANEOUS | Status: DC

## 2013-01-07 NOTE — Care Management Note (Unsigned)
    Page 1 of 2   01/07/2013     3:44:37 PM   CARE MANAGEMENT NOTE 01/07/2013  Patient:  Bradley Brandt, Bradley Brandt   Account Number:  [id number]  Date Initiated:  01/06/2013  Documentation initiated by:  Jiles Crocker  Subjective/Objective Assessment:   ADMITTED WITH RIGHT SIDED WEAKNESS     Action/Plan:   incarcerated x 2 months. PATIENT TO RETURN TO JAIL AT DISCHARGE WHEN STABLE   Anticipated DC Date:  01/09/2013   Anticipated DC Plan:  [facility]RECTIONS FACILITY      DC Planning Services  CM consult      Choice offered to / List presented to:             Status of service:  In process, will continue to follow Medicare Important Message given?  NA - LOS <3 / Initial given by admissions (If response is "NO", the following Medicare IM given date fields will be blank) Date Medicare IM given:   Date Additional Medicare IM given:    Discharge Disposition:    Per UR Regulation:  Reviewed for med. necessity/level of care/duration of stay  If discussed at Long Length of Stay Meetings, dates discussed:    Comments:  01/07/13 1515 Elmer Bales RN, MSN, CM-  Spoke with outpatient neuro rehab to schedule appointment. Instructions that were written on paperwork regarding prison protocol were verbalized.  Pt's appointment was set for 01/15/13 at 0845.  CM called Al, charge RN to relay this information.  Per charge RN, patient's Rebif injection will not be available until Friday 01/10/13.  Per Dr Janee Morn, plan is to discharge patient after his Rebif injection tomorrow.  All follow-up information was entered into the AVS.  Pt's RN updated.   01/07/13 1430 Elmer Bales RN, MS CM-  Information faxed to oupatient neuro rehab, with instructions to make a randomized schedule for PT/OT, per prison protocol.  Al, Charge RN was listed as contact [phone].  CM called [place] [facility] to schedule follow-up appointment.  Per [place], patient's chart will be reviewed by Dr Tat, and the office will call Al,  charge RN with the appointment date and time during business hours on 01/08/13.   01/07/13 1400 Elmer Bales RN, MSN, CM-  Spoke with Dahlia Client with PT regarding patient's physical limitations. Dahlia Client spoke with Al, charge RN to relay information to ensure that patient is appropriate level of care for the prison.  Pt is being sent back with a 3N1 and rolling walker, order placed.  01/07/13 1330 Elmer Bales RN, MSN, CM-  Spoke with jail guard, Mr Klipstein at patient's bedside regarding the process for setting up PT/OT at discharge.  Mr Tera Helper was unsure of the process, since there have been recent changes, but states that he will make a call to find out and relay the information to CM.  Awaiting input for setting up therapies at discharge. Will continue to follow.   01/06/2013- B CHANDLER RN,BSN,MHA

## 2013-01-07 NOTE — Progress Notes (Addendum)
Physical Therapy Treatment (Vestibular Evaluation Patient Details Name: HESSTON HITCHENS MRN: [id number] DOB: 1980/11/02 Today's Date: 01/07/2013 Time: 4132-4401 PT Time Calculation (min): 53 min  PT Assessment / Plan / Recommendation  History of Present Illness 32 y.o. male admitted to [facility] from prison for MS flare and increased R sided weakness, inability to walk.  MRI revealied 1.1 cm rounded lesion posterior left eriventricular white matter and 6 mm lesion posterior right frontal lobe.     Clinical Impression No nystagmus seen with positional testing, however pt symptomatic with most testing. Feel pt does have some central vestibular-ocular deficits which are creating poor gaze stabilization. Pt provided with exercises to begin to address this. However variable performance and variable symptoms are worrisome for exaggeration of deficits particularly with sitting and standing balance.     Eye Alignment WNL  Spontaneous  Nystagmus Negative  Gaze holding nystagmus Negative  Smooth pursuit WFL- reports dizziness  Oculomotor WFL - reports dizziness  Saccades WFL  VOR slow WFL- reports dizziness  Head Thrust Test [facility]  Head Shaking Nystagmus Not tested  Rt. Hallpike Dix Negative for nystagmus, reports dizziness  Lt. Hallpike Dix Negative- for nystagmus, reports dizziness  Rt. Roll Test Negative for nystagmus, reports dizziness  Lt. Roll Test  Negative for nystagmus, reports dizziness  Motion sensitivity Positive      Visual- Vestibular Interactions:  - Sitting: impaired, only able to complete ~15 sec of VOR exercises at a time - Standing: gaze stabilization difficult for pt on target "A"     Follow Up Recommendations  Outpatient PT- Vestibular rehab;Home health PT (pending transportation provided by facility)   Spoke with "Al" from facility that will receive pt to provide update of pt's level of assist needed. PT recommending only stand pivot transfers with assist at this time and  progress to ambulation only after outpatient PT approves.            Equipment Recommendations  Rolling walker with 5" wheels;3in1 (PT) (shower chair)       Frequency Min 3X/week   Progress towards PT Goals Progress towards PT goals: Progressing toward goals  Plan Current plan remains appropriate    Precautions / Restrictions Precautions Precautions: Fall Restrictions Other Position/Activity Restrictions: pt's legs bil shackeled to the bed.     Pertinent Vitals/Pain No c/o pain    Mobility  Bed Mobility Bed Mobility: Supine to Sit;Sitting - Scoot to Edge of Bed;Sit to Supine Supine to Sit: HOB flat;With rails;5: Supervision Sitting - Scoot to Edge of Bed: 5: Supervision Sit to Supine: HOB flat;5: Supervision Details for Bed Mobility Assistance: Pt leans posteriorly upon reaching sitting. Able to correct with cues.  Transfers Transfers: Sit to Stand;Stand to Sit Sit to Stand: With upper extremity assist;From bed;4: Min assist Stand to Sit: 3: Mod assist;4: Min assist;With upper extremity assist;To bed Details for Transfer Assistance: Pt with variable performance. Leans posteriorly when focusing on task.  requires verbal and light tactile cuing to facilitate trunk and hip flexion. Decreased carry over of cues.      Exercises General Exercises - Lower Extremity Mini-Sqauts: AAROM;Both;Standing;20 reps (4 sets of 5) VOR x 1 viewing exercises x 15-20 sec (5 bouts horizontal and vertical); gaze stabilization on stationary target sitting, sit <> stand transitions, and during static stance.     PT Goals (current goals can now be found in the care plan section) Acute Rehab PT Goals Patient Stated Goal: to go back to his normal level of function and return to  the same block he was in with his "partner" PTA.    Visit Information  Last PT Received On: 01/07/13 Assistance Needed: +1 History of Present Illness: 32 y.o. male admitted to [facility] from prison for MS flare and increased R  sided weakness, inability to walk.  MRI revealied 1.1 cm rounded lesion posterior left eriventricular white matter and 6 mm lesion posterior right frontal lobe.      Subjective Data  Patient Stated Goal: to go back to his normal level of function and return to the same block he was in with his "partner" PTA.     Cognition  Cognition Arousal/Alertness: Awake/alert Behavior During Therapy: WFL for tasks assessed/performed Overall Cognitive Status: History of cognitive impairments - at baseline    Balance  Balance Balance Assessed: Yes Static Sitting Balance Static Sitting - Balance Support: Feet supported Static Sitting - Level of Assistance: 5: Stand by assistance Static Sitting - Comment/# of Minutes: Intermittent posterior lean, able to correct with cues Dynamic Sitting Balance Dynamic Sitting - Balance Support: Feet supported Dynamic Sitting - Level of Assistance: 5: Stand by assistance Dynamic Sitting Balance - Compensations: Pt leans posteriorly, kicking legs out into extension.  verbal cues provided to correct trunk.  Pt then will spontaneoulsy lean forward to shift hips on the bed, then lean posteriorly again.  Dynamic Sitting - Balance Activities: Reaching for objects Static Standing Balance Static Standing - Balance Support: No upper extremity supported Static Standing - Level of Assistance: 4: Min assist;3: Mod assist Static Standing - Comment/# of Minutes: Variable performance again, full back extension with no use of hip strategy. Worked on use of hip and ankle strategies and weight shifting anteriorly. Practiced forward reaching. Cues for "nose of toes" provided and pt able to balance approrpriately for second at a time.   End of Session PT - End of Session Equipment Utilized During Treatment: Gait belt Activity Tolerance: Patient limited by fatigue Patient left: in bed;with call bell/phone within reach;Other (comment) (with guard present, reshackeled to the bed.  )   GP      Wilhemina Bonito 01/07/2013, 2:14 PM

## 2013-01-07 NOTE — Discharge Summary (Addendum)
Physician Discharge Summary  Bradley Brandt YNW:[id number] DOB: 16-Nov-1980 DOA: 01/02/2013  PCP: No primary provider on file.  Admit date: 01/02/2013 Discharge date: 01/07/2013  Time spent: 60 minutes  Recommendations for Outpatient Follow-up:  1. Patient is to followup with neurology, Dr. Arbutus Leas in 2 weeks. 2. Patient is to followup on neuro rehabilitation for outpatient physical therapy.  Discharge Diagnoses:  Principal Problem:   Multiple sclerosis flare Active Problems:   Right hemiparesis   Bipolar disorder, unspecified   Elevated BP   Discharge Condition: Stable and improved  Diet recommendation: Regular  Filed Weights   01/02/13 2106  Weight: 98.9 kg (218 lb 0.6 oz)    History of present illness:  Patient is a 32 year old [place] American male with history of bipolar disorder, RR-MS diagnosed in 2006, currently in prison since May 13th, 2014 was brought to [facility] for evaluation of a right-sided weakness and difficulty walking. Patient reported that his last MS relapse was one year ago and had left-sided weakness at that time which resolved after steroids. He has been taking Rebif for MS. Patient reports that he has not been getting Rebif as he is supposed to in the prison. He also has right-sided facial droop. Per patient he has been having difficulty ambulating.   Hospital Course:  #1 right-sided weakness/progressive right hemiparesis secondary to MS flare Patient was admitted with right-sided weakness and difficulty walking. Hasn't had a history of multiple sclerosis diagnosed in 2006. Patient was a Rebif prior to admission and prior to his incarceration however per patient was not receiving rebif in jail. Neurology was consulted and patient was seen in consultation and followed during the hospitalization. Patient was started on IV Solu-Medrol and received a total of 5 days worth. MRI of the head was obtained which was negative for any acute infarct and consistent  with multiple sclerosis. Patient was seen by physical therapy during the hospitalization. Patient improved during the hospitalization. Patient's rebif of was resumed. Patient will be discharged back to jail on rebif 3 times a week Monday, Wednesdays and Fridays. Patient is to also followup with neurology as outpatient. Patient will be discharged in stable and improved condition. #2 elevated blood pressure  On admission  It was noted that patient had elevated blood pressures.Patient denied any history of hypertension. Bp improved. This will need to be followed up as outpatient.  #3 bipolar disorder  Stable. Outpatient followup.  #4 MS  See problem #1.   Procedures: MRI head 01/03/13  Chest x-ray 01/02/2013   Consultations: Neurology: Dr. Cyril Mourning 01/02/2013   Discharge Exam: Filed Vitals:   01/06/13 1400 01/06/13 2200 01/07/13 0200 01/07/13 0600  BP: 143/73 135/59 153/69 149/77  Pulse: 86 57 43 44  Temp: 98 F (36.7 C) 97.9 F (36.6 C) 97.7 F (36.5 C) 98 F (36.7 C)  TempSrc: Oral     Resp: 18 18 18 18   Height:      Weight:      SpO2: 99% 100% 95% 95%    General: NAD Cardiovascular: RRR Respiratory: CTAB  Discharge Instructions      Discharge Orders   Future Appointments Provider Department Dept Phone   01/15/2013 8:45 AM Marlis Edelson, OT [facility] [phone]   01/15/2013 9:30 AM Amy Aleatha Borer, PT Outpt [facility] [phone]   Future Orders Complete By Expires     Diet general  As directed     Discharge instructions  As directed     Comments:  Follow up with Neurology as outpatient. Follow up for outpatient neuro rehab.    Increase activity slowly  As directed         Medication List         baclofen 20 MG tablet  Commonly known as:  LIORESAL  Take 1 tablet (20 mg total) by mouth 3 (three) times daily.     gabapentin 600 MG tablet  Commonly known as:  NEURONTIN   Take 1 tablet (600 mg total) by mouth 3 (three) times daily.     ibuprofen 400 MG tablet  Commonly known as:  ADVIL,MOTRIN  Take 1 tablet (400 mg total) by mouth every 6 (six) hours as needed. May give prior to rebif.     interferon beta-1a 44 MCG/0.5ML injection  Commonly known as:  REBIF  Inject 0.5 mLs (44 mcg total) into the skin 3 (three) times a week. Give 3 times weekly M/W/F     meclizine 25 MG tablet  Commonly known as:  ANTIVERT  Take 1 tablet (25 mg total) by mouth 3 (three) times daily as needed for dizziness or nausea.       Allergies  Allergen Reactions  . Lithium Rash   Follow-up Information   Follow up with [facility] In 2 weeks. (Office to call Al, charge RN with appointment date and time.)       Follow up with Outpatient Neuro Rehab On 01/15/2013. (8:45am PT, 9:30am OT)    Contact information:   [address] [place], [place] [address]  [phone]       The results of significant diagnostics from this hospitalization (including imaging, microbiology, ancillary and laboratory) are listed below for reference.    Significant Diagnostic Studies: Mr Laqueta Jean BJ Contrast  Jan 25, 2013   *RADIOLOGY REPORT*  Clinical Data: History of multiple sclerosis and bipolar disorder. Recent right-sided weakness and difficulty walking.  MRI HEAD WITHOUT AND WITH CONTRAST  Technique:  Multiplanar, multiecho pulse sequences of the brain and surrounding structures were obtained according to standard protocol without and with intravenous contrast  Contrast: 20mL MULTIHANCE GADOBENATE DIMEGLUMINE 529 MG/ML IV SOLN  Comparison: 03/27/2009 MR.  Findings: Motion degraded exam.  Significant white matter type changes consistent with the patient's history of multiple sclerosis.  Findings have progressed since prior examination.  Two areas demonstrate mild restricted motion as can be seen with areas of active demyelination; 1.1 cm rounded lesion posterior left periventricular  white matter and 6 mm lesion posterior right frontal lobe.  None of these areas demonstrate enhancement.  No dominant white matter abnormality with associated mass effect detected (as may be seen with progressive multifocal leukoencephalopathy).  No acute infarct.  No intracranial hemorrhage.  Major intracranial vascular structures are patent.  Atrophy without hydrocephalus.  Mild prominence soft tissue posterior-superior nasopharynx unchanged and may represent adenoidal tissue.  Cervical medullary junction, pituitary region and pineal region unremarkable.  Mild exophthalmos.  No obvious optic nerve demyelination.  IMPRESSION:  Significant white matter type changes consistent with the patient's history of multiple sclerosis.  Findings have progressed since prior examination.  Two areas demonstrate mild restricted motion as can be seen with areas of active demyelination; 1.1 cm rounded lesion posterior left periventricular white matter and 6 mm lesion posterior right frontal lobe.  None of these areas demonstrate enhancement.  Please see above.   Original Report Authenticated By: Lacy Duverney, M.D.   Dg Chest Port 1 View  01/02/2013   *RADIOLOGY REPORT*  Clinical Data: Flare of  multiple sclerosis  PORTABLE CHEST - 1 VIEW  Comparison: Portable exam 1416 hours compared to 05/08/2010  Findings: Normal heart size, mediastinal contours, and pulmonary vascularity. Lungs clear. Bones unremarkable. No pneumothorax.  IMPRESSION: No acute abnormalities.   Original Report Authenticated By: Ulyses Southward, M.D.    Microbiology: No results found for this or any previous visit (from the past 240 hour(s)).   Labs: Basic Metabolic Panel:  Recent Labs Lab 01/02/13 1351 01/03/13 0435 01/04/13 0540 01/05/13 0510 01/06/13 0530  NA 140 137 138 138 140  K 4.2 4.1 4.5 4.4 4.0  CL 105 101 108 104 104  CO2 29 26 23 26 29   GLUCOSE 85 158* 144* 135* 137*  BUN 11 14 13 15 16   CREATININE 1.03 0.86 0.81 0.74 0.79  CALCIUM  9.6 9.4 9.1 8.7 8.6   Liver Function Tests: No results found for this basename: AST, ALT, ALKPHOS, BILITOT, PROT, ALBUMIN,  in the last 168 hours No results found for this basename: LIPASE, AMYLASE,  in the last 168 hours No results found for this basename: AMMONIA,  in the last 168 hours CBC:  Recent Labs Lab 01/02/13 1351 01/03/13 0435  WBC 3.4* 5.6  NEUTROABS 1.4*  --   HGB 14.9 14.8  HCT 40.9 40.9  MCV 89.9 89.1  PLT 149* 165   Cardiac Enzymes: No results found for this basename: CKTOTAL, CKMB, CKMBINDEX, TROPONINI,  in the last 168 hours BNP: BNP (last 3 results) No results found for this basename: PROBNP,  in the last 8760 hours CBG:  Recent Labs Lab 01/06/13 1134 01/06/13 1651 01/07/13 0653 01/07/13 1145 01/07/13 1652  GLUCAP 188* 100* 145* 147* 105*       Signed:  Kaity Pitstick  [facility] 01/07/2013, 9:45 PM

## 2013-01-07 NOTE — Progress Notes (Signed)
NEURO HOSPITALIST PROGRESS NOTE   SUBJECTIVE:                                                                                                                        No complaints.  Improved in strength.   OBJECTIVE:                                                                                                                           Vital signs in last 24 hours: Temp:  [97.7 F (36.5 C)-98 F (36.7 C)] 98 F (36.7 C) (07/22 0600) Pulse Rate:  [43-86] 44 (07/22 0600) Resp:  [18] 18 (07/22 0600) BP: (135-153)/(59-77) 149/77 mmHg (07/22 0600) SpO2:  [95 %-100 %] 95 % (07/22 0600)  Intake/Output from previous day: 07/21 0701 - 07/22 0700 In: 3 [I.V.:3] Out: 1500 [Urine:1500] Intake/Output this shift:   Nutritional status: General  Past Medical History  Diagnosis Date  . MS (multiple sclerosis)   . Bipolar 1 disorder   . Bipolar disorder, unspecified 01/03/2013     Neurologic Exam:  Mental Status: Alert, oriented, thought content appropriate.  Speech fluent without evidence of aphasia.  Able to follow 3 step commands without difficulty. Cranial Nerves: II: Discs flat bilaterally; Visual fields grossly normal, pupils equal, round, reactive to light and accommodation III,IV, VI: ptosis not present, extra-ocular motions intact bilaterally V,VII: smile symmetric, facial light touch sensation normal bilaterally VIII: hearing normal bilaterally IX,X: gag reflex present XI: bilateral shoulder shrug XII: midline tongue extension Motor: Right : Upper extremity   4/5    Left:     Upper extremity   5/5  Lower extremity   4/5     Lower extremity   5/5 --give way and poor effort on the right arm and right leg strength.  --"inconsistencies with performance" per PT  Tone and bulk:normal tone throughout; no atrophy noted Sensory: Pinprick and light touch intact throughout, bilaterally Deep Tendon Reflexes:  Right: Upper Extremity   Left: Upper  extremity   biceps (C-5 to C-6) 2/4   biceps (C-5 to C-6) 2/4 tricep (C7) 2/4    triceps (C7) 2/4 Brachioradialis (C6) 2/4  Brachioradialis (C6) 2/4  Lower Extremity Lower Extremity  quadriceps (L-2 to L-4) 2/4  quadriceps (L-2 to L-4) 2/4 Achilles (S1) 2/4   Achilles (S1) 2/4  Plantars: Right: downgoing   Left: downgoing Cerebellar: normal finger-to-nose,  normal heel-to-shin test CV: pulses palpable throughout    Lab Results: No results found for this basename: cbc, bmp, coags, chol, tri, ldl, hga1c   Lipid Panel No results found for this basename: CHOL, TRIG, HDL, CHOLHDL, VLDL, LDLCALC,  in the last 72 hours  Studies/Results: No results found.  MEDICATIONS                                                                                                                        Scheduled: . baclofen  20 mg Oral TID  . enoxaparin (LOVENOX) injection  40 mg Subcutaneous Q24H  . gabapentin  600 mg Oral TID  . interferon beta-1a  44 mcg Subcutaneous 3 times weekly  . sodium chloride  3 mL Intravenous Q12H    ASSESSMENT/PLAN:                                                                                                             MS relapse -he has finished IV solumedrol 5 doses ( 7/17-7/21) . He continues to show 4/5 strength on exam but gives limited effort and shows give way strength. He is to restart his Rebif tomorrow.   Recommend: 1) out patient PT/OT 2) Continue home dose of Rebif with next dose scheduled for tomorrow.  3) patient would benefit from out patient neurology follow up [facility] [address] [place], [place] [address]--[phone] or [facility] neurology [address] [place], [place] [address] [phone]  No further recommendations.  Neurology will S/O. Discussed with Dr. Janee Morn  It is unclear to me if he was receiving Rebif in prison, but will resume his schedule tomorrow.     Assessment and plan discussed with with attending physician and they are in  agreement.    [name] PA-C [facility] [phone]  01/07/2013, 9:42 AM

## 2013-01-08 LAB — GLUCOSE, CAPILLARY
Glucose-Capillary: 117 mg/dL — ABNORMAL HIGH (ref 70–99)
Glucose-Capillary: 93 mg/dL (ref 70–99)

## 2013-01-08 NOTE — Progress Notes (Signed)
Patient seen before discharge.  Patient to get a dose of rebif x 1 before d/c today (wed) and then again at the jail on Friday.   Not a good effort with exam but seems to be weaker on right leg- good upper strength.  Marlin Canary DO

## 2013-01-15 ENCOUNTER — Ambulatory Visit: Payer: Medicaid Other | Admitting: Occupational Therapy

## 2013-01-15 ENCOUNTER — Ambulatory Visit: Payer: Medicaid Other | Attending: Internal Medicine | Admitting: Physical Therapy

## 2013-01-15 DIAGNOSIS — IMO0001 Reserved for inherently not codable concepts without codable children: Secondary | ICD-10-CM | POA: Insufficient documentation

## 2013-01-15 DIAGNOSIS — R269 Unspecified abnormalities of gait and mobility: Secondary | ICD-10-CM | POA: Insufficient documentation

## 2013-01-15 DIAGNOSIS — M6281 Muscle weakness (generalized): Secondary | ICD-10-CM | POA: Insufficient documentation

## 2013-01-28 ENCOUNTER — Ambulatory Visit (INDEPENDENT_AMBULATORY_CARE_PROVIDER_SITE_OTHER): Payer: 59 | Admitting: Neurology

## 2013-01-28 ENCOUNTER — Encounter: Payer: Self-pay | Admitting: Neurology

## 2013-01-28 VITALS — BP 100/68 | HR 78 | Temp 98.2°F | Ht 70.0 in | Wt 218.0 lb

## 2013-01-28 DIAGNOSIS — G35 Multiple sclerosis: Secondary | ICD-10-CM

## 2013-01-28 MED ORDER — GABAPENTIN 300 MG PO CAPS: 300.0000 mg | ORAL_CAPSULE | Freq: Three times a day (TID) | ORAL | Status: DC

## 2013-01-28 NOTE — Patient Instructions (Addendum)
1.  Continue the Rebif for now ( SQ three times a week).  I will see if we can get you started on Tecfidera, the oral agent. 2.  Refer for physical therapy for gait and weakness 3.  I strongly recommend using the walker to get around.  Sitting in the wheelchair is not healthy secondary to deconditioning. 4.  We will add gabapentin 300mg  tablet to each 600mg  tablet for total of 900mg  three times daily 5.  Repeat MRI of brain w/wo contrast in 3 months.  Will get MRI of cervical spine as well. 6.  Follow up with me after MRI. 7.  Continue baclofen.  Your MRI is scheduled at [facility] located at [address] in [place] on Saturday, November 8th at 9:00am. . Please arrive 30 minutes prior to your appointment time.   [phone].

## 2013-01-28 NOTE — Progress Notes (Signed)
NEUROLOGY CONSULTATION NOTE  ULIS KAPS MRN: [id number] DOB: [birth year]  Referring provider: [name], PA-C Primary care provider: Dr. Ronne Brandt  Reason for consult:  Multiple sclerosis  HISTORY OF PRESENT ILLNESS: Bradley Brandt is a 32 year old male, with history of relapsing-remitting multiple sclerosis and bipolar disorder who presents to establish care.  Patient is currently incarcerated and is handcuffed.  He is accompanied by two deputies.  Records and images personally reviewed where available.  He was diagnosed with RR-MS in 2006, after he presented with right sided weakness, including the face.  He was initially on Avonex, which was stopped due to frequent relapses.  He has been on Rebif for many years, averaging one flare-up every 2 years.  His last flare-up prior to hospitalization was approximately one year ago, in which he developed left-sided weakness.  Patient was admitted to the hospital from 01/02/13 to 01/07/13 for evaluation of right-sided weakness, right-sided facial droop and gait difficulty.  He had been taking Rebif, which reportedly was not administered correctly.  He was evaluated by inpatient neurology.   MRI Brain did reveal active demyelination in the left posterior periventricular white matter and right posterior frontal lobe.  He received 5 days of IV Solumedrol.  He was seen by physical therapy and he clinically improved.  He was discharged on Rebif 40mcg/0.5 mLs SQ three times a week, gabapentin 600mg  TID and baclofen 20mg  TID.    Since his hospitalization, he continues to have profound weakness, particularly of his right leg, and cannot walk.  He is confined to a wheelchair at this time, and is currently unable to use a walker in the prison.  He continues to have back pain and notes numbness and stiffness of his right arm.  Heat makes symptoms worse.  He usually sees fine without his glasses, but now needs his glasses to see.  He denies ocular pain.  He smokes  marijuana, but not cigarettes.  He denies bowel or bladder dysfunction.  MRI Brain w/wo (01/02/13): Significant white matter type changes consistent with the patient's history of multiple sclerosis.  Findings have progressed since prior examination from 03/27/09.  Two areas demonstrate mild restricted motion as can be seen with areas of active demyelination; 1.1 cm rounded lesion posterior left periventricular white matter and 6 mm lesion posterior right frontal lobe.  None of these areas demonstrate enhancement. Labs: WBC 5.6, Hgb 14.8, Hct 40.9, PLT 165, Na 140, K 4, Cl 104, CO2 29, glucose 137, BUN 16, Cr 0.79, Ca 8.7, drug screen negative, UA with trace LE but negative nitrite and bacteria.  Today, he also asks about oral disease-modifying agents, since he has bruising and hardening around the area of injection sites.  PAST MEDICAL HISTORY: Past Medical History  Diagnosis Date  . MS (multiple sclerosis)     dx 2006  . Bipolar 1 disorder   . Bipolar disorder, unspecified 01/03/2013    PAST SURGICAL HISTORY: Past Surgical History  Procedure Laterality Date  . Tumor removal  abdomen    benign    MEDICATIONS: Current Outpatient Prescriptions on File Prior to Visit  Medication Sig Dispense Refill  . baclofen (LIORESAL) 20 MG tablet Take 1 tablet (20 mg total) by mouth 3 (three) times daily.  90 each  0  . gabapentin (NEURONTIN) 600 MG tablet Take 1 tablet (600 mg total) by mouth 3 (three) times daily.  90 tablet  0  . ibuprofen (ADVIL,MOTRIN) 400 MG tablet Take 1 tablet (400 mg total) by  mouth every 6 (six) hours as needed. May give prior to rebif.  30 tablet  0  . interferon beta-1a (REBIF) 44 MCG/0.5ML injection Inject 0.5 mLs (44 mcg total) into the skin 3 (three) times a week. Give 3 times weekly M/W/F  6 mL  0  . meclizine (ANTIVERT) 25 MG tablet Take 1 tablet (25 mg total) by mouth 3 (three) times daily as needed for dizziness or nausea.  30 tablet  0   No current  facility-administered medications on file prior to visit.    ALLERGIES: Allergies  Allergen Reactions  . Lithium Rash    FAMILY HISTORY: No family history on file.  SOCIAL HISTORY: History   Social History  . Marital Status: Single    Spouse Name: N/A    Number of Children: N/A  . Years of Education: N/A   Occupational History  . Not on file.   Social History Main Topics  . Smoking status: Former Smoker -- 1.00 packs/day    Types: Cigarettes    Quit date: 10/17/2012  . Smokeless tobacco: Former Neurosurgeon  . Alcohol Use: Yes     Comment: before he was incarcerated he drack a lot everyday  . Drug Use: Yes    Special: Marijuana  . Sexually Active: Not on file   Other Topics Concern  . Not on file   Social History Narrative  . No narrative on file    REVIEW OF SYSTEMS: Constitutional: No fevers, chills, or sweats, no generalized fatigue, change in appetite Eyes: No visual changes, double vision, eye pain Ear, nose and throat: No hearing loss, ear pain, nasal congestion, sore throat Cardiovascular: No chest pain, palpitations Respiratory:  No shortness of breath at rest or with exertion, wheezes GastrointestinaI: No nausea, vomiting, diarrhea, abdominal pain, fecal incontinence Genitourinary:  No dysuria, urinary retention or frequency Musculoskeletal:  No neck pain, back pain Integumentary: No rash, pruritus, skin lesions Neurological: as above Psychiatric: No depression, insomnia, anxiety Endocrine: No palpitations, fatigue, diaphoresis, mood swings, change in appetite, change in weight, increased thirst Hematologic/Lymphatic:  No anemia, purpura, petechiae. Allergic/Immunologic: no itchy/runny eyes, nasal congestion, recent allergic reactions, rashes  PHYSICAL EXAM: Filed Vitals:   01/28/13 1319  BP: 100/68  Pulse: 78  Temp: 98.2 F (36.8 C)   General: No acute distress Head:  Normocephalic/atraumatic Neck: supple, mild paraspinal tenderness, positive  Lhermitte's sign Back: Mild paraspinal tenderness Heart: regular rate and rhythm Lungs: Clear to auscultation bilaterally. Vascular: No carotid bruits. Neurological Exam: Mental status: alert and oriented to person, place, and time, speech fluent and not dysarthric, language intact. Cranial nerves: CN I: not tested CN II: pupils equal, round and reactive to light, visual fields intact, fundi unremarkable. CN III, IV, VI:  full range of motion, no nystagmus, no ptosis CN V: facial sensation intact CN VII: upper and lower face symmetric CN VIII: hearing intact CN IX, X: gag intact, uvula midline CN XI: sternocleidomastoid and trapezius muscles intact CN XII: tongue midline Bulk & Tone: normal, no fasciculations. Motor: Limited exam as patient is handcuffed, but at least 5-/5 in UE bilaterally, 4+ right hip flexion, 5-/5 right knee flexion/extension, otherwise 5/5.  Limited effort Sensation: endorses reduced pinprick and vibration sensation of LUE/LE Deep Tendon Reflexes: 2+ UEs, 3+ LEs, toes down Finger to nose testing: not performed as patient is handcuffed Heel to shin: slowed but without dysmetria Gait: unable to stand or walk on own.  With assistance, takes short steps. Romberg not tested as patient is already  unsteady on feet.  IMPRESSION & PLAN: Relapsing-remitting multiple sclerosis. 1.  Continue Rebif for now.  We will try and get approval for Tecfidera. 2.  Increase gabapentin to 900mg  TID for back pain.  Continue baclofen 3.  Check vitamin D level. 4.  Physical therapy 5.  Needs to use walker for transportation.  Confinement to wheelchair would be detrimental for recovery 6.  Repeat MRI of brain, as well as MRI C-spine, in 3 months. 7.  Follow up in 3 months after MRI.  Thank you for allowing me to take part in the care of this patient.  Shon Millet, DO  CC:  Billee Cashing, MD

## 2013-01-29 ENCOUNTER — Telehealth: Payer: Self-pay | Admitting: Neurology

## 2013-01-29 LAB — VITAMIN D 25 HYDROXY (VIT D DEFICIENCY, FRACTURES): Vit D, 25-Hydroxy: 25 ng/mL — ABNORMAL LOW (ref 30–89)

## 2013-01-29 NOTE — Telephone Encounter (Signed)
Spoke with April, RN--charge nurse at the [facility] jail where the patient is incarcerated. I let her know that Bradley Brandt's Vit D level was very low and that he should begin Vit D3 supplement 4000 IU daily. He also would benefit from about 15 minutes of sun exposure daily. He needs to have a repeat Vit D level prior to his next appointment here with Dr. Everlena Cooper and April reports that can be done at the jail prior to the appointment.

## 2013-01-29 NOTE — Telephone Encounter (Signed)
Message copied by Benay Spice on Wed Jan 29, 2013  9:51 AM ------      Message from: JAFFE, ADAM R      Created: Wed Jan 29, 2013  6:04 AM       Mr. Perry vitamin D level is low.  I would like to have him start taking D3 4000 IU daily and repeat a level in 3 month, prior to next visit      AJ      ----- Message -----         From: Lab In [facility] Interface         Sent: 01/29/2013   4:25 AM           To: Cira Servant, DO                   ------

## 2013-02-11 ENCOUNTER — Ambulatory Visit: Payer: Medicaid Other | Attending: Internal Medicine | Admitting: Rehabilitative and Restorative Service Providers"

## 2013-02-11 DIAGNOSIS — IMO0001 Reserved for inherently not codable concepts without codable children: Secondary | ICD-10-CM | POA: Insufficient documentation

## 2013-02-11 DIAGNOSIS — M6281 Muscle weakness (generalized): Secondary | ICD-10-CM | POA: Insufficient documentation

## 2013-02-11 DIAGNOSIS — R269 Unspecified abnormalities of gait and mobility: Secondary | ICD-10-CM | POA: Insufficient documentation

## 2013-02-24 ENCOUNTER — Ambulatory Visit: Payer: Medicaid Other | Attending: Internal Medicine | Admitting: Physical Therapy

## 2013-02-24 DIAGNOSIS — IMO0001 Reserved for inherently not codable concepts without codable children: Secondary | ICD-10-CM | POA: Insufficient documentation

## 2013-02-24 DIAGNOSIS — M6281 Muscle weakness (generalized): Secondary | ICD-10-CM | POA: Insufficient documentation

## 2013-02-24 DIAGNOSIS — R269 Unspecified abnormalities of gait and mobility: Secondary | ICD-10-CM | POA: Insufficient documentation

## 2013-03-06 ENCOUNTER — Ambulatory Visit: Payer: Medicaid Other | Admitting: Physical Therapy

## 2013-04-26 ENCOUNTER — Other Ambulatory Visit: Payer: 59

## 2013-04-30 ENCOUNTER — Encounter: Payer: Self-pay | Admitting: Neurology

## 2013-04-30 ENCOUNTER — Telehealth: Payer: Self-pay | Admitting: Neurology

## 2013-04-30 ENCOUNTER — Ambulatory Visit: Payer: 59 | Admitting: Neurology

## 2013-04-30 NOTE — Telephone Encounter (Signed)
Called and spoke with the patient. Spoke with him earlier this morning and he said he would be at his appointment at 2 today. He did not come. Gave him a number to call re: Tecfidera ([phone]) and urged him to call today as he reports that he only has one more pill. He says he has rescheduled his MRIs as well.

## 2013-05-10 ENCOUNTER — Other Ambulatory Visit: Payer: Medicaid Other

## 2013-05-11 ENCOUNTER — Other Ambulatory Visit: Payer: Self-pay | Admitting: Neurology

## 2013-05-11 ENCOUNTER — Ambulatory Visit
Admission: RE | Admit: 2013-05-11 | Discharge: 2013-05-11 | Disposition: A | Discharge status: Home / Self Care | Payer: Medicaid Other | Source: Ambulatory Visit | Attending: Neurology | Admitting: Neurology

## 2013-05-11 DIAGNOSIS — G35 Multiple sclerosis: Secondary | ICD-10-CM

## 2013-05-14 ENCOUNTER — Other Ambulatory Visit: Payer: Medicaid Other

## 2013-05-20 ENCOUNTER — Telehealth: Payer: Self-pay | Admitting: Neurology

## 2013-05-20 NOTE — Telephone Encounter (Signed)
Spoke with Bradley Brandt. Information given as per Dr. Everlena Cooper below. Per Dr. Everlena Cooper, the MRIs with contrast that weren't done do not need to be done at this point in time. F/u appointment scheduled for 05/23/13 at 11:00 am. The patient also mentions that he never got his Tecfidera. I told him I would f/u with the drug rep about this issue. I also told him that going forward when things like this happened, he needed to let us know about it so we could get to the root of the problem. He states he will.

## 2013-05-20 NOTE — Telephone Encounter (Signed)
Message copied by Benay Spice on Tue May 20, 2013  4:27 PM ------      Message from: JAFFE, ADAM R      Created: Mon May 19, 2013  7:24 AM       There are no new or active lesions on MRI, which is good.  Should follow up with me.      ----- Message -----         From: Rad Results In Interface         Sent: 05/11/2013   5:00 PM           To: Cira Servant, DO                   ------

## 2013-05-23 ENCOUNTER — Ambulatory Visit: Payer: Medicaid Other | Admitting: Neurology

## 2013-05-26 ENCOUNTER — Ambulatory Visit: Payer: Medicaid Other | Admitting: Neurology

## 2013-05-27 ENCOUNTER — Encounter: Payer: Self-pay | Admitting: Neurology

## 2013-05-28 ENCOUNTER — Encounter: Payer: Self-pay | Admitting: Neurology

## 2013-05-28 ENCOUNTER — Ambulatory Visit (INDEPENDENT_AMBULATORY_CARE_PROVIDER_SITE_OTHER): Payer: Medicaid Other | Admitting: Neurology

## 2013-05-28 VITALS — BP 116/68 | HR 72 | Temp 98.0°F | Ht 71.0 in | Wt 219.0 lb

## 2013-05-28 DIAGNOSIS — G35 Multiple sclerosis: Secondary | ICD-10-CM

## 2013-05-28 NOTE — Progress Notes (Signed)
NEUROLOGY FOLLOW UP OFFICE NOTE  Bradley Brandt [id number]  HISTORY OF PRESENT ILLNESS: Bradley Brandt is a 32 year old male with history of bipolar disorder who follows up for relapsing-remitting multiple sclerosis.  Records and images were personally reviewed where available.    He was diagnosed with RR-MS in 2006, after he presented with right sided weakness, including the face.  He was initially on Avonex, which was stopped due to frequent relapses.  He was then started on Rebif, which we stopped due to difficulty with injections.  On the Rebif, he was averaging one flare-up every 2 years.  His flareups usually present as right sided weakness, although he did have one that presented with left-sided weakness.  Patient was admitted to the hospital in July 2014 for MS exacerbation after having right-sided weakness, right-sided facial droop and gait difficulty.  He had been taking Rebif, which reportedly was not administered correctly.  MRI Brain did reveal active demyelination in the left posterior periventricular white matter and right posterior frontal lobe.  He received 5 days of IV Solumedrol.  He was seen by physical therapy and he clinically improved.  He was discharged on Rebif 65mcg/0.5 mLs SQ three times a week, gabapentin 600mg  TID and baclofen 20mg  TID.    Since last visit, he has been released from prison.  He received physical therapy.  Due to the difficulty and discomfort of administering Rebif, he was interested in [place].  However, we were having difficulty getting this medication approved, especially while he was incarcerated.  He has since been released but it has still been difficult getting him the Tacfidera.  He continues to have difficulty getting the Tecfidera and has sometimes gone a week at a time without it.  He currently no longer has a supply.  We have been working with the Tecfidera rep in making sure he gets his medication.   He continued to have right leg weakness,  although it is improved.  He is no longer in a wheelchair and uses a cane.  He says he fell a couple of times because the right leg gave out.  He notes chronic back pain with pain running down the front of his leg.  He continues to have right sided numbness of the arm and leg.  He denies ocular pain.  He smokes marijuana, but not cigarettes.  He denies bowel or bladder dysfunction.  MRI Brain w/wo (01/03/13): Significant white matter type changes consistent with the patient's history of multiple sclerosis.  Findings have progressed since prior examination from 03/27/09.  Two areas demonstrate mild restricted motion as can be seen with areas of active demyelination; 1.1 cm rounded lesion posterior left periventricular white matter and 6 mm lesion posterior right frontal lobe.  None of these areas demonstrate enhancement.  MRI Brain wo (05/19/13):  numerous periventricular white matter lesions, similar in size and number compared with 01/03/13.  There was resolving restricted diffusion in the parietal white matter leisons.  However, there mild restricted diffusion in the left frontal white lesion, not present previously.  Unable to tolerate for contrast due to claustrophobia.  MRI Cervical spine wo (05/19/13):  Questionable cord lesions at C2 and C4-5, however it may be artifact due to motion.  Unable to tolerate for contrast due to claustrophobia.  Vitamin D level from 01/29/13 was 25.  Advised starting D3 4000 IU daily but he has not done this.[name]  PAST MEDICAL HISTORY: Past Medical History  Diagnosis Date  . MS (  multiple sclerosis)     dx 2006  . Bipolar 1 disorder   . Bipolar disorder, unspecified 01/03/2013    MEDICATIONS: Current Outpatient Prescriptions on File Prior to Visit  Medication Sig Dispense Refill  . baclofen (LIORESAL) 20 MG tablet Take 1 tablet (20 mg total) by mouth 3 (three) times daily.  90 each  0  . gabapentin (NEURONTIN) 300 MG capsule Take 1 capsule (300 mg total) by mouth 3  (three) times daily.  90 capsule  11  . gabapentin (NEURONTIN) 600 MG tablet Take 1 tablet (600 mg total) by mouth 3 (three) times daily.  90 tablet  0  . ibuprofen (ADVIL,MOTRIN) 400 MG tablet Take 1 tablet (400 mg total) by mouth every 6 (six) hours as needed. May give prior to rebif.  30 tablet  0  . meclizine (ANTIVERT) 25 MG tablet Take 1 tablet (25 mg total) by mouth 3 (three) times daily as needed for dizziness or nausea.  30 tablet  0   No current facility-administered medications on file prior to visit.    ALLERGIES: Allergies  Allergen Reactions  . Lithium Rash    FAMILY HISTORY: Family History  Problem Relation Age of Onset  . Adopted: Yes    SOCIAL HISTORY: History   Social History  . Marital Status: Single    Spouse Name: N/A    Number of Children: N/A  . Years of Education: N/A   Occupational History  . Not on file.   Social History Main Topics  . Smoking status: Current Every Day Smoker -- 1.00 packs/day    Types: Cigarettes  . Smokeless tobacco: Former Neurosurgeon  . Alcohol Use: Yes     Comment: one or two beers a week  . Drug Use: No  . Sexual Activity: Not on file   Other Topics Concern  . Not on file   Social History Narrative  . No narrative on file    REVIEW OF SYSTEMS: Constitutional: No fevers, chills, or sweats, no generalized fatigue, change in appetite Eyes: No visual changes, double vision, eye pain Ear, nose and throat: No hearing loss, ear pain, nasal congestion, sore throat Cardiovascular: No chest pain, palpitations Respiratory:  No shortness of breath at rest or with exertion, wheezes GastrointestinaI: No nausea, vomiting, diarrhea, abdominal pain, fecal incontinence Genitourinary:  No dysuria, urinary retention or frequency Musculoskeletal:  No neck pain, back pain Integumentary: No rash, pruritus, skin lesions Neurological: as above Psychiatric: no depression at this time. Endocrine: No palpitations, fatigue, diaphoresis, mood  swings, change in appetite, change in weight, increased thirst Hematologic/Lymphatic:  No anemia, purpura, petechiae. Allergic/Immunologic: no itchy/runny eyes, nasal congestion, recent allergic reactions, rashes  PHYSICAL EXAM: Filed Vitals:   05/28/13 1026  BP: 116/68  Pulse: 72  Temp: 98 F (36.7 C)   General: No acute distress Head:  Normocephalic/atraumatic Neck: supple, no paraspinal tenderness, full range of motion Heart:  Regular rate and rhythm Lungs:  Clear to auscultation bilaterally Back: No paraspinal tenderness Neurological Exam: alert and oriented to person, place, and time. Speech fluent and not dysarthric, language intact.  Endorsed reduced sensation in right V1-V2, otherwise CN II-XII intact. Fundoscopic exam unremarkable, no papilledema.  Bulk and tone normal, muscle strength 5/5 throughout.  Endorsed reduced pinprick in right upper extremity, right foot, and left lower anterior thigh.  Deep tendon reflexes 2+ in UEs and 3+ in LEs, toes downgoing.  Finger to nose and heel to shin testing intact.  Gait wide-based with right leg limp,  Romberg with sway.  IMPRESSION: Relapsing-remitting MS.  Unfortunately, he has not been able to take Tecfidera consistently thus far.  PLAN: 1.  Working with Tecfidera rep to ensure that he gets his medication without lapses. 2.  Advised to start D3 4000 IU daily. 3.  Once he starts the Tecfidera consistently, will repeat MRI in 3 months with follow up soon after.  Shon Millet, DO  CC:  Billee Cashing, MD

## 2013-05-28 NOTE — Patient Instructions (Addendum)
1.  We will get on it about getting you the Tecfidera.  Must take it daily 2.  Get from the pharmacy vitamin D supplements.  Specifically, take D3 4000 IU daily. 3.  Once you get enough Tecfidera to take daily (without missing any doses), call us and we will schedule repeat MRI and follow up 3 months later.

## 2013-08-20 ENCOUNTER — Encounter (HOSPITAL_COMMUNITY): Payer: Self-pay | Admitting: Emergency Medicine

## 2013-08-20 ENCOUNTER — Emergency Department (HOSPITAL_COMMUNITY)
Admission: EM | Admit: 2013-08-20 | Discharge: 2013-08-20 | Disposition: A | Discharge status: Home / Self Care | Payer: Medicaid Other | Source: Home / Self Care | Attending: Family Medicine | Admitting: Family Medicine

## 2013-08-20 DIAGNOSIS — G35 Multiple sclerosis: Secondary | ICD-10-CM

## 2013-08-20 MED ORDER — PREDNISONE 5 MG PO TABS: ORAL_TABLET | ORAL | Status: DC

## 2013-08-20 NOTE — Discharge Instructions (Signed)
Multiple Sclerosis Multiple sclerosis (MS) is a disease of the central nervous system. It leads to loss of the insulating covering of the nerves (myelin sheath) of your brain. When this happens, brain signals do not get transmitted properly or may not get transmitted at all. The symptoms of MS occur in episodes or attacks. These attacks may last weeks to months. There may be long periods of nearly no problems between attacks. The age of onset of MS varies.  CAUSES The cause of MS is unknown. However, it is more common in the [place] than in the [place]. RISK FACTORS There is a higher incidence of MS in women than in men. MS is not an inherited illness, although your risk of MS is higher if you have a relative with MS. SIGNS AND SYMPTOMS  The symptoms of MS occur in episodes or attacks. These attacks may last weeks to months. There may be long periods of almost no symptoms between attacks. The symptoms of MS vary. This is because of the many different ways it affects the central nervous system. The main symptoms of MS include:  Vision problems and eye pain.  Numbness.  Weakness.  Paralysis in your arms, hands, feet, and legs (extremities).  Balance problems.  Tremors. DIAGNOSIS  Your health care provider can diagnose MS with the help of imaging exams and lab tests. These may include specialized X-ray exams and spinal fluid tests. The best imaging exam to confirm a diagnosis of MS is MRI. TREATMENT  There is no known cure for MS, but there are medicines that can decrease the number and frequency of attacks. Steroids are often used for short-term relief. Physical and occupational therapy may also help. HOME CARE INSTRUCTIONS   Take medicines as directed by your health care provider.  Exercise as directed by your health care provider. SEEK MEDICAL CARE IF: You begin to feel depressed. SEEK IMMEDIATE MEDICAL CARE IF:  You develop paralysis.  You develop  problems with bladder, bowel, or sexual function.  You develop mental changes, such as forgetfulness or mood swings.  You have a seizure. Document Released: 06/02/2000 Document Revised: 03/26/2013 Document Reviewed: 02/10/2013 [facility] Patient Information 2014 [place], [place].  Please follow up with Dr. Everlena Cooper tomorrow.

## 2013-08-20 NOTE — ED Provider Notes (Signed)
CSN: [id number]     Arrival date & time 08/20/13  1620 History   First MD Initiated Contact with Patient 08/20/13 1716     Chief Complaint  Patient presents with  . Numbness   (Consider location/radiation/quality/duration/timing/severity/associated sxs/prior Treatment) HPI Comments: Patient reports he feels as if he is having a flare of his Multiple Sclerosis that is affecting his right hand and forearm. States symptoms began 2 days ago as numbness in his right hand and now he has painful "pins and needles" sensation that extends to his right forearm. Has also noticed a slight decrease in right hand grip strength. Reports that when symptoms such as these typically occur, his neurologist (Dr. Everlena Cooper with [facility]) will start him on a tapering dose of prednisone with beginning dose of 20mg . He attempted to contact his neurologist today for an appointment but was told that there were no appointments available until late March 2015. No reported injury or additional symptoms.   The history is provided by the patient.    Past Medical History  Diagnosis Date  . MS (multiple sclerosis)     dx 2006  . Bipolar 1 disorder   . Bipolar disorder, unspecified 01/03/2013   Past Surgical History  Procedure Laterality Date  . Tumor removal  1983    abdomen benign   Family History  Problem Relation Age of Onset  . Adopted: Yes  . Ataxia Neg Hx   . Chorea Neg Hx   . Dementia Neg Hx   . Mental retardation Neg Hx   . Migraines Neg Hx   . Multiple sclerosis Neg Hx   . Neurofibromatosis Neg Hx   . Neuropathy Neg Hx   . Parkinsonism Neg Hx   . Seizures Neg Hx   . Stroke Neg Hx    History  Substance Use Topics  . Smoking status: Current Every Day Smoker -- 0.50 packs/day    Types: Cigarettes  . Smokeless tobacco: Former Neurosurgeon  . Alcohol Use: 1.2 oz/week    2 Cans of beer per week     Comment:  two to three beers a week    Review of Systems  All other systems reviewed and are  negative.    Allergies  Lithium  Home Medications   Current Outpatient Rx  Name  Route  Sig  Dispense  Refill  . baclofen (LIORESAL) 20 MG tablet   Oral   Take 1 tablet (20 mg total) by mouth 3 (three) times daily.   90 each   0   . Dimethyl Fumarate (TECFIDERA) 240 MG CPDR   Oral   Take 240 mg by mouth 2 (two) times daily.         [name] gabapentin (NEURONTIN) 300 MG capsule   Oral   Take 600 mg by mouth 3 (three) times daily.         [name] oxyCODONE (ROXICODONE) 15 MG immediate release tablet   Oral   Take 15 mg by mouth every 6 (six) hours as needed for pain.         [name] gabapentin (NEURONTIN) 600 MG tablet   Oral   Take 1 tablet (600 mg total) by mouth 3 (three) times daily.   90 tablet   0   . ibuprofen (ADVIL,MOTRIN) 400 MG tablet   Oral   Take 1 tablet (400 mg total) by mouth every 6 (six) hours as needed. May give prior to rebif.   30 tablet   0   . meclizine (  ANTIVERT) 25 MG tablet   Oral   Take 1 tablet (25 mg total) by mouth 3 (three) times daily as needed for dizziness or nausea.   30 tablet   0   . predniSONE (DELTASONE) 5 MG tablet      Take 4 tabs po QD day 1, 3 tabs po QD day 2, 2 tabs po QD day 3, 1 tab po QD day 4 then stop.   10 tablet   0    BP 149/77  Pulse 51  Temp(Src) 98.5 F (36.9 C) (Oral)  Resp 15  SpO2 98% Physical Exam  Nursing note and vitals reviewed. Constitutional: He is oriented to person, place, and time. He appears well-developed and well-nourished. No distress.  HENT:  Head: Normocephalic and atraumatic.  Eyes: Conjunctivae are normal. No scleral icterus.  Cardiovascular: Normal rate, regular rhythm and normal heart sounds.   Pulmonary/Chest: Effort normal and breath sounds normal.  Abdominal: Soft. Bowel sounds are normal.  Musculoskeletal: Normal range of motion.       Right forearm: Normal.  Other that subjective paraesthesias of right hand and forearm, remainder of exam is normal with intact function and distal  pulses.  Neurological: He is alert and oriented to person, place, and time. He has normal strength. No cranial nerve deficit. Coordination and gait normal. GCS eye subscore is 4. GCS verbal subscore is 5. GCS motor subscore is 6.  Skin: Skin is warm and dry. No rash noted.  Psychiatric: He has a normal mood and affect. His behavior is normal.    ED Course  Procedures (including critical care time) Labs Review Labs Reviewed - No data to display Imaging Review No results found.   MDM   1. Multiple sclerosis flare    MS flare affecting Right UE: will begin prednisone taper as mentioned in HPI and encourage patient to follow up (at least by telephone) with his neurologist tomorrow to determine if additional evaluation or medication needed. No clinical indication of CVA/TIA.     Jess BartersJennifer Lee NavarinoPresson, GeorgiaPA 08/20/13 [phone]

## 2013-08-20 NOTE — ED Notes (Signed)
State when he walks to the store his back starts hurting and legs give out.  He fell twice 2 weeks ago.  C/o numbness R hand and moving up his R arm onset 2 days ago. C/o swelling to dorsum L hand onset yesterday.  Using a cane for 4-5 yrs.

## 2013-08-21 ENCOUNTER — Other Ambulatory Visit: Payer: Self-pay | Admitting: *Deleted

## 2013-08-21 DIAGNOSIS — G35 Multiple sclerosis: Secondary | ICD-10-CM

## 2013-08-21 NOTE — ED Provider Notes (Signed)
Medical screening examination/treatment/procedure(s) were performed by a resident physician or non-physician practitioner and as the supervising physician I was immediately available for consultation/collaboration.  Shadoe Bethel, MD    Omega Slager S Anasha Perfecto, MD 08/21/13 0747 

## 2013-08-22 ENCOUNTER — Telehealth: Payer: Self-pay | Admitting: *Deleted

## 2013-08-22 NOTE — Telephone Encounter (Signed)
Valium 5 mg #1 called into [facility] [phone] 30 min before MRI

## 2013-08-26 ENCOUNTER — Ambulatory Visit: Payer: Medicaid Other | Admitting: Neurology

## 2013-08-26 ENCOUNTER — Telehealth: Payer: Self-pay | Admitting: *Deleted

## 2013-08-26 NOTE — Telephone Encounter (Signed)
Patient will call to make a follow up appt on 08/29/13 after his MRI has been completed . He ask for pain medication  I advised him to contact his PCP as he is the prescribing Dr for this .

## 2013-08-28 ENCOUNTER — Ambulatory Visit
Admission: RE | Admit: 2013-08-28 | Discharge: 2013-08-28 | Disposition: A | Discharge status: Home / Self Care | Payer: Medicaid Other | Source: Ambulatory Visit | Attending: Neurology | Admitting: Neurology

## 2013-08-28 DIAGNOSIS — G35 Multiple sclerosis: Secondary | ICD-10-CM

## 2013-08-28 MED ORDER — GADOBENATE DIMEGLUMINE 529 MG/ML IV SOLN
20.0000 mL | Freq: Once | INTRAVENOUS | Status: AC | PRN
  Administered 2013-08-28: 20 mL via INTRAVENOUS

## 2013-08-29 ENCOUNTER — Other Ambulatory Visit: Payer: Self-pay | Admitting: *Deleted

## 2013-08-29 NOTE — Telephone Encounter (Signed)
Patient will have IV infusion  Solu medrol starting on Monday 09/01/13 at 9am x 3 days @short  stay . I have spoke with patient he is aware

## 2013-09-01 ENCOUNTER — Encounter (HOSPITAL_COMMUNITY)
Admission: RE | Admit: 2013-09-01 | Discharge: 2013-09-01 | Disposition: A | Discharge status: Home / Self Care | Payer: Medicaid Other | Source: Ambulatory Visit | Attending: Neurology | Admitting: Neurology

## 2013-09-01 DIAGNOSIS — G35 Multiple sclerosis: Secondary | ICD-10-CM | POA: Insufficient documentation

## 2013-09-01 MED ORDER — SODIUM CHLORIDE 0.9 % IV SOLN
1000.0000 mg | Freq: Every day | INTRAVENOUS | Status: DC
  Administered 2013-09-01: 1000 mg via INTRAVENOUS
  Filled 2013-09-01: qty 8

## 2013-09-02 ENCOUNTER — Encounter (HOSPITAL_COMMUNITY)
Admission: RE | Admit: 2013-09-02 | Discharge: 2013-09-02 | Disposition: A | Discharge status: Home / Self Care | Payer: Medicaid Other | Source: Ambulatory Visit | Attending: Neurology | Admitting: Neurology

## 2013-09-02 MED ORDER — SODIUM CHLORIDE 0.9 % IV SOLN
1000.0000 mg | Freq: Every day | INTRAVENOUS | Status: DC
  Administered 2013-09-02: 1000 mg via INTRAVENOUS
  Filled 2013-09-02: qty 8

## 2013-09-03 ENCOUNTER — Encounter (HOSPITAL_COMMUNITY)
Admission: RE | Admit: 2013-09-03 | Discharge: 2013-09-03 | Disposition: A | Discharge status: Home / Self Care | Payer: Medicaid Other | Source: Ambulatory Visit | Attending: Neurology | Admitting: Neurology

## 2013-09-03 MED ORDER — SODIUM CHLORIDE 0.9 % IV SOLN
1000.0000 mg | Freq: Every day | INTRAVENOUS | Status: DC
  Administered 2013-09-03: 1000 mg via INTRAVENOUS
  Filled 2013-09-03: qty 8

## 2013-09-12 ENCOUNTER — Encounter: Payer: Self-pay | Admitting: Neurology

## 2013-09-12 ENCOUNTER — Ambulatory Visit (INDEPENDENT_AMBULATORY_CARE_PROVIDER_SITE_OTHER): Payer: Medicaid Other | Admitting: Neurology

## 2013-09-12 VITALS — BP 130/76 | HR 78 | Temp 97.0°F | Resp 16 | Ht 71.0 in | Wt 223.6 lb

## 2013-09-12 DIAGNOSIS — G35 Multiple sclerosis: Secondary | ICD-10-CM

## 2013-09-12 MED ORDER — GABAPENTIN 300 MG PO CAPS
900.0000 mg | ORAL_CAPSULE | Freq: Three times a day (TID) | ORAL | Status: DC
Start: 2013-09-12 — End: 2013-12-01

## 2013-09-12 NOTE — Progress Notes (Signed)
NEUROLOGY FOLLOW UP OFFICE NOTE  Bradley Brandt [id number]  HISTORY OF PRESENT ILLNESS: Bradley Brandt is a 33 year old male with history of bipolar disorder who follows up for relapsing-remitting multiple sclerosis.  Records and images were personally reviewed where available.    UPDATE: He went to the ED on 08/20/13 after exhibiting symptoms of a flare up.  The ED note described the symptoms as involving his right arm and hand, described as painful pins and needles sensation extending up to the forearm.  However, he tells me the symptoms consisted of pain involving the left leg (ankle up to the buttock) and left hand and forearm.  His gait became more unsteady as well.  He was given prednisone.  MRI of the brain and cervical spine with and without contrast was performed on 08/28/13 and personally reviewed.  Progression seen when compared to prior study in November, with acute and chronic demyelination seen in the cortical and subcortical white matter.  New areas of restricted diffusion seen involving the left parietal cortex, left posterior temporal subcortical white matter, and right superior cerebellum.  There is no definite postcontrast enhancement but they suggest active ongoing demyelination given the different appearance on DWI and new areas of T2 and FLAIR signal prolongation.  He underwent 3 days of Solumedrol 1mg , from 09/01/13 to 09/03/13.  He notes much improvement in symptoms, although he still has some pain in the leg and still feels unsteady.  He continues to use the cane and says he fell a couple of times yesterday.   He is taking the Tecfidera as prescribed.  HISTORY: He was diagnosed with RR-MS in 2006, after he presented with right sided weakness, including the face.  He was initially on Avonex, which was stopped due to frequent relapses.  He was then started on Rebif, which we stopped due to difficulty with injections.  On the Rebif, he was averaging one flare-up every 2 years.  His  flareups usually present as right sided weakness, although he did have one that presented with left-sided weakness.  Patient was admitted to the hospital in July 2014 for MS exacerbation after having right-sided weakness, right-sided facial droop and gait difficulty.  He had been taking Rebif, which reportedly was not administered correctly.  MRI Brain did reveal active demyelination in the left posterior periventricular white matter and right posterior frontal lobe.  He received 5 days of IV Solumedrol.  He was seen by physical therapy and he clinically improved.  He was discharged on Rebif 61mcg/0.5 mLs SQ three times a week, gabapentin 600mg  TID and baclofen 20mg  TID.    He received physical therapy.  Due to the difficulty and discomfort of administering Rebif, he was interested in [place].  However, we were having difficulty getting this medication approved, especially while he was incarcerated.  He has since been released but it has still been difficult getting him the Tacfidera.  He continues to have difficulty getting the Tecfidera and has sometimes gone a week at a time without it.  He currently no longer has a supply.  We have been working with the Tecfidera rep in making sure he gets his medication.   He continued to have right leg weakness, although it is improved.  He is no longer in a wheelchair and uses a cane.  He says he fell a couple of times because the right leg gave out.  He notes chronic back pain with pain running down the front of his leg.  He continues to have right sided numbness of the arm and leg.  He denies ocular pain.  He smokes marijuana, but not cigarettes.  He denies bowel or bladder dysfunction.   MRI Brain w/wo (01/03/13): Significant white matter type changes consistent with the patient's history of multiple sclerosis.  Findings have progressed since prior examination from 03/27/09.  Two areas demonstrate mild restricted motion as can be seen with areas of active  demyelination; 1.1 cm rounded lesion posterior left periventricular white matter and 6 mm lesion posterior right frontal lobe.  None of these areas demonstrate enhancement.   MRI Brain wo (05/19/13):  numerous periventricular white matter lesions, similar in size and number compared with 01/03/13.  There was resolving restricted diffusion in the parietal white matter leisons.  However, there mild restricted diffusion in the left frontal white lesion, not present previously.  Unable to tolerate for contrast due to claustrophobia.   MRI Cervical spine wo (05/19/13):  Questionable cord lesions at C2 and C4-5, however it may be artifact due to motion.  Unable to tolerate for contrast due to claustrophobia.   Vitamin D level from 01/29/13 was 25.  Advised starting D3 4000 IU daily but he has not done this.  PAST MEDICAL HISTORY: Past Medical History  Diagnosis Date  . MS (multiple sclerosis)     dx 2006  . Bipolar 1 disorder   . Bipolar disorder, unspecified 01/03/2013    MEDICATIONS: Current Outpatient Prescriptions on File Prior to Visit  Medication Sig Dispense Refill  . baclofen (LIORESAL) 20 MG tablet Take 1 tablet (20 mg total) by mouth 3 (three) times daily.  90 each  0  . Dimethyl Fumarate (TECFIDERA) 240 MG CPDR Take 240 mg by mouth 2 (two) times daily.      [name] ibuprofen (ADVIL,MOTRIN) 400 MG tablet Take 1 tablet (400 mg total) by mouth every 6 (six) hours as needed. May give prior to rebif.  30 tablet  0  . meclizine (ANTIVERT) 25 MG tablet Take 1 tablet (25 mg total) by mouth 3 (three) times daily as needed for dizziness or nausea.  30 tablet  0  . oxyCODONE (ROXICODONE) 15 MG immediate release tablet Take 15 mg by mouth every 6 (six) hours as needed for pain.      . predniSONE (DELTASONE) 5 MG tablet Take 4 tabs po QD day 1, 3 tabs po QD day 2, 2 tabs po QD day 3, 1 tab po QD day 4 then stop.  10 tablet  0   No current facility-administered medications on file prior to visit.     ALLERGIES: Allergies  Allergen Reactions  . Lithium Rash    FAMILY HISTORY: Family History  Problem Relation Age of Onset  . Adopted: Yes  . Ataxia Neg Hx   . Chorea Neg Hx   . Dementia Neg Hx   . Mental retardation Neg Hx   . Migraines Neg Hx   . Multiple sclerosis Neg Hx   . Neurofibromatosis Neg Hx   . Neuropathy Neg Hx   . Parkinsonism Neg Hx   . Seizures Neg Hx   . Stroke Neg Hx     SOCIAL HISTORY: History   Social History  . Marital Status: Single    Spouse Name: N/A    Number of Children: N/A  . Years of Education: N/A   Occupational History  . Not on file.   Social History Main Topics  . Smoking status: Current Every Day Smoker -- 0.50 packs/day  Types: Cigarettes  . Smokeless tobacco: Former NeurosurgeonUser  . Alcohol Use: 1.2 oz/week    2 Cans of beer per week     Comment:  two to three beers a week  . Drug Use: 7.00 per week    Special: Marijuana  . Sexual Activity: Yes    Partners: Female   Other Topics Concern  . Not on file   Social History Narrative  . No narrative on file    REVIEW OF SYSTEMS: Constitutional: No fevers, chills, or sweats, no generalized fatigue, change in appetite Eyes: No visual changes, double vision, eye pain Ear, nose and throat: No hearing loss, ear pain, nasal congestion, sore throat Cardiovascular: No chest pain, palpitations Respiratory:  No shortness of breath at rest or with exertion, wheezes GastrointestinaI: No nausea, vomiting, diarrhea, abdominal pain, fecal incontinence Genitourinary:  No dysuria, urinary retention or frequency Musculoskeletal:  No neck pain, back pain Integumentary: No rash, pruritus, skin lesions Neurological: as above Psychiatric: No depression, insomnia, anxiety Endocrine: No palpitations, fatigue, diaphoresis, mood swings, change in appetite, change in weight, increased thirst Hematologic/Lymphatic:  No anemia, purpura, petechiae. Allergic/Immunologic: no itchy/runny eyes, nasal  congestion, recent allergic reactions, rashes  PHYSICAL EXAM: Filed Vitals:   09/12/13 1107  BP: 130/76  Pulse: 78  Temp: 97 F (36.1 C)  Resp: 16   General: No acute distress Head:  Normocephalic/atraumatic Neck: supple, no paraspinal tenderness, full range of motion Heart:  Regular rate and rhythm Lungs:  Clear to auscultation bilaterally Back: No paraspinal tenderness Neurological Exam: alert and oriented to person, place, and time. Attention span and concentration intact, recent and remote memory intact, fund of knowledge intact.  Speech fluent and not dysarthric, language intact.  CN II-XII intact. Fundoscopic exam unremarkable without vessel changes, exudates, hemorrhages or papilledema.  Bulk and tone normal.  Muscle strength testing reveals give-way weakness and reduced effort when testing the left upper extremity.  Otherwise 5/5.  Endorses reduced pinprick sensation in the left lower extremity from foot to mid-calf in a stocking distribution.  Also reduced pinprick sensation on the dorsum of the left hand and forearm.  Deep tendon reflexes 2+ in upper extremities and 3+ in lower extremities, toes downgoing.  Finger to nose testing intact.  Gait with cane and with right leg limp.  Left foot stumbled and caught the ground a couple of times, able to turn. Romberg with sway.  IMPRESSION: Multiple sclerosis.  Initially seemed relapsing-remitting.  Will have to continue to monitor to see if this becomes secondary progressive.  I feel that the weakness on the left side may be functional as he does not seem to give full effort.  PLAN: 1.  Continue Tecfidera 2.  Start D3 4000 IU daily 3.  Increase gabapentin to 900mg  TID.  Continue baclofen 20mg  TID 4.  Referral to PT 5.  Repeat MRI of brain and cervical spine w/wo contrast in 3 months.  If progression or new active lesions, will have to consider changing Tecfidera to another agent. 6.  Follow up soon after repeat MRIs  Shon MilletAdam Etna Forquer,  DO  CC: Billee CashingWayland McKenzie, MD

## 2013-09-12 NOTE — Patient Instructions (Addendum)
1.  Continue the Tecfidera and baclofen. 2.  Increase the gabapentin to 900mg  three times daily (i will send a prescription for 300mg  tablets, take 3 tablets three times daily) for the pain. 3.  Start taking vitamin D3 4000 units daily.  It's over the counter.  As a pharmacist. 4.  We will refer you to physical therapy. 5.  We will repeat MRI in 3 months with follow up here soon after.  If the MRI shows progression or new lesions, we will likely have to switch Tecfidera to another medication. 6. MRI Brain and Cervical spine  [facility] 12 [facility] . I will call in 1  5mg  valium  The day before  Take upon arrival you must have a driver

## 2013-11-03 ENCOUNTER — Telehealth: Payer: Self-pay | Admitting: *Deleted

## 2013-11-03 ENCOUNTER — Other Ambulatory Visit: Payer: Self-pay | Admitting: *Deleted

## 2013-11-03 DIAGNOSIS — G35 Multiple sclerosis: Secondary | ICD-10-CM

## 2013-11-03 NOTE — Telephone Encounter (Signed)
Patient  Is aware that a cbc and diff is ordered  He will pick up lab slip today

## 2013-11-07 LAB — CBC WITH DIFFERENTIAL/PLATELET
Basophils Absolute: 0.1 10*3/uL (ref 0.0–0.1)
Basophils Relative: 1 % (ref 0–1)
Eosinophils Absolute: 0.1 10*3/uL (ref 0.0–0.7)
Eosinophils Relative: 2 % (ref 0–5)
HCT: 44.9 % (ref 39.0–52.0)
Hemoglobin: 15.9 g/dL (ref 13.0–17.0)
LYMPHS ABS: 2.6 10*3/uL (ref 0.7–4.0)
Lymphocytes Relative: 42 % (ref 12–46)
MCH: 32.6 pg (ref 26.0–34.0)
MCHC: 35.4 g/dL (ref 30.0–36.0)
MCV: 92.2 fL (ref 78.0–100.0)
MONOS PCT: 9 % (ref 3–12)
Monocytes Absolute: 0.5 10*3/uL (ref 0.1–1.0)
NEUTROS ABS: 2.8 10*3/uL (ref 1.7–7.7)
Neutrophils Relative %: 46 % (ref 43–77)
Platelets: 207 10*3/uL (ref 150–400)
RBC: 4.87 MIL/uL (ref 4.22–5.81)
RDW: 13.7 % (ref 11.5–15.5)
WBC: 6.1 10*3/uL (ref 4.0–10.5)

## 2013-11-26 ENCOUNTER — Ambulatory Visit (HOSPITAL_COMMUNITY): Admission: RE | Admit: 2013-11-26 | Payer: Medicaid Other | Source: Ambulatory Visit

## 2013-11-26 ENCOUNTER — Ambulatory Visit (HOSPITAL_COMMUNITY): Payer: Medicaid Other | Attending: Neurology

## 2013-12-01 ENCOUNTER — Ambulatory Visit (INDEPENDENT_AMBULATORY_CARE_PROVIDER_SITE_OTHER): Payer: Medicaid Other | Admitting: Neurology

## 2013-12-01 ENCOUNTER — Telehealth: Payer: Self-pay | Admitting: *Deleted

## 2013-12-01 ENCOUNTER — Encounter: Payer: Self-pay | Admitting: Neurology

## 2013-12-01 VITALS — BP 150/82 | HR 82 | Temp 98.1°F | Resp 18 | Ht 70.0 in | Wt 236.3 lb

## 2013-12-01 DIAGNOSIS — G35D Multiple sclerosis, unspecified: Secondary | ICD-10-CM

## 2013-12-01 DIAGNOSIS — G35 Multiple sclerosis: Secondary | ICD-10-CM

## 2013-12-01 MED ORDER — GABAPENTIN 600 MG PO TABS: 600.0000 mg | ORAL_TABLET | Freq: Three times a day (TID) | ORAL | Status: DC

## 2013-12-01 MED ORDER — GABAPENTIN 300 MG PO CAPS: 300.0000 mg | ORAL_CAPSULE | Freq: Three times a day (TID) | ORAL | Status: DC

## 2013-12-01 NOTE — Patient Instructions (Addendum)
1.  Continue Tecfidera 2.  Must get MRI of brain and cervical spine with and without contrast  12/13/13 at [address]  At 12:15 pm  3.  Vitamin D3 4000 IU daily. 4.  Physical and occupational therapy. 5.  Change gabapentin to taking one 600mg  tablet with one 300mg  capsule together three times daily. 6.  Follow up after MRI

## 2013-12-01 NOTE — Progress Notes (Addendum)
NEUROLOGY FOLLOW UP OFFICE NOTE  Bradley Brandt [id number]  HISTORY OF PRESENT ILLNESS: Bradley Brandt is a 33 year old male with history of bipolar disorder who follows up for relapsing-remitting multiple sclerosis.    UPDATE: Medications:  Tecfidera, gabapentin 900mg  three times daily, baclofen 20mg  three times daily 11/07/13 LABS:  CBC w/diff unremarkable (WBC 6.1, HGB 15.9, HCT 44.9, PLT 207)  He did not show up for his repeat MRI because he forgot.  Over the past two days, he has noted increased heaviness in his right arm and leg, associated with pain radiating from the right foot, up the right leg and right side of his torso.  He takes Roxicodone, but ran out.  He says he continues to take the Tecfidera as prescribed.  He has not started D3.  Still ambulates with cane.  Takes three 300mg  capsules of gabapentin three times daily.  He feels he did better with the 600mg  tablets.  HISTORY: He was diagnosed with RR-MS in 2006, after he presented with right sided weakness, including the face.  He was initially on Avonex, which was stopped due to frequent relapses.  He was then started on Rebif, which we stopped due to difficulty with injections.  On the Rebif, he was averaging one flare-up every 2 years.  His flareups usually present as right sided weakness, although he did have one that presented with left-sided weakness.  Patient was admitted to the hospital in July 2014 for MS exacerbation after having right-sided weakness, right-sided facial droop and gait difficulty.  He had been taking Rebif, which reportedly was not administered correctly.  MRI Brain did reveal active demyelination in the left posterior periventricular white matter and right posterior frontal lobe.  He received 5 days of IV Solumedrol.  He was seen by physical therapy and he clinically improved.  He was discharged on Rebif 37mcg/0.5 mLs SQ three times a week, gabapentin 600mg  TID and baclofen 20mg  TID.    He received  physical therapy.  Due to the difficulty and discomfort of administering Rebif, he was interested in [place].  However, we were having difficulty getting this medication approved, especially while he was incarcerated.  He has since been released but it has still been difficult getting him the Tacfidera.  He continues to have difficulty getting the Tecfidera and has sometimes gone a week at a time without it.  He currently no longer has a supply.  We have been working with the Tecfidera rep in making sure he gets his medication.   He continued to have right leg weakness, although it is improved.  He is no longer in a wheelchair and uses a cane.  He says he fell a couple of times because the right leg gave out.  He notes chronic back pain with pain running down the front of his leg.  He continues to have right sided numbness of the arm and leg.  He denies ocular pain.  He smokes marijuana, but not cigarettes.  He denies bowel or bladder dysfunction.  He went to the ED on 08/20/13 after exhibiting symptoms of a flare up.  The ED note described the symptoms as involving his right arm and hand, described as painful pins and needles sensation extending up to the forearm.  However, he tells me the symptoms consisted of pain involving the left leg (ankle up to the buttock) and left hand and forearm. His gait became more unsteady as well.  He was given prednisone.  MRI of  the brain and cervical spine with and without contrast was performed on 08/28/13, revealing active and chronic lesions in the brain and chronic lesions in cervical cord.  He underwent 3 days of Solumedrol 1mg , from 09/01/13 to 09/03/13.   Previous medication:  Rebif   MRI Brain w/wo (01/03/13): Significant white matter type changes consistent with the patient's history of multiple sclerosis.  Findings have progressed since prior examination from 03/27/09.  Two areas demonstrate mild restricted motion as can be seen with areas of active demyelination; 1.1  cm rounded lesion posterior left periventricular white matter and 6 mm lesion posterior right frontal lobe.  None of these areas demonstrate enhancement.  MRI Brain wo (05/19/13):  numerous periventricular white matter lesions, similar in size and number compared with 01/03/13.  There was resolving restricted diffusion in the parietal white matter leisons.  However, there mild restricted diffusion in the left frontal white lesion, not present previously.  Unable to tolerate for contrast due to claustrophobia.  MRI Cervical spine wo (05/19/13):  Questionable cord lesions at C2 and C4-5, however it may be artifact due to motion.  Unable to tolerate for contrast due to claustrophobia.  MRI Brain w/wo (08/28/13):  new areas of restricted diffusion involving the left parietal cortex, left posterior temporal subcortical white matter and right superior cerebellum, none with definite postcontrast enhancement but given the different appearance on DWI and new areas of T2 and FLAIR signal prolongation, consistent with active ongoing demyelination.  MRI Cervical spine w/wo (08/28/13):  chronic cord hyperintensity at C2, C4 and C4 levels.  No postcontrast enhancement.  Vitamin D level from 01/29/13 was 25.   PAST MEDICAL HISTORY: Past Medical History  Diagnosis Date  . MS (multiple sclerosis)     dx 2006  . Bipolar 1 disorder   . Bipolar disorder, unspecified 01/03/2013    MEDICATIONS: Current Outpatient Prescriptions on File Prior to Visit  Medication Sig Dispense Refill  . baclofen (LIORESAL) 20 MG tablet Take 1 tablet (20 mg total) by mouth 3 (three) times daily.  90 each  0  . Dimethyl Fumarate (TECFIDERA) 240 MG CPDR Take 240 mg by mouth 2 (two) times daily.      [name]. ibuprofen (ADVIL,MOTRIN) 400 MG tablet Take 1 tablet (400 mg total) by mouth every 6 (six) hours as needed. May give prior to rebif.  30 tablet  0  . meclizine (ANTIVERT) 25 MG tablet Take 1 tablet (25 mg total) by mouth 3 (three) times daily  as needed for dizziness or nausea.  30 tablet  0  . oxyCODONE (ROXICODONE) 15 MG immediate release tablet Take 15 mg by mouth every 6 (six) hours as needed for pain.      . predniSONE (DELTASONE) 5 MG tablet Take 4 tabs po QD day 1, 3 tabs po QD day 2, 2 tabs po QD day 3, 1 tab po QD day 4 then stop.  10 tablet  0   No current facility-administered medications on file prior to visit.    ALLERGIES: Allergies  Allergen Reactions  . Lithium Rash    FAMILY HISTORY: Family History  Problem Relation Age of Onset  . Adopted: Yes  . Ataxia Neg Hx   . Chorea Neg Hx   . Dementia Neg Hx   . Mental retardation Neg Hx   . Migraines Neg Hx   . Multiple sclerosis Neg Hx   . Neurofibromatosis Neg Hx   . Neuropathy Neg Hx   . Parkinsonism Neg Hx   .  Seizures Neg Hx   . Stroke Neg Hx     SOCIAL HISTORY: History   Social History  . Marital Status: Single    Spouse Name: N/A    Number of Children: N/A  . Years of Education: N/A   Occupational History  . Not on file.   Social History Main Topics  . Smoking status: Current Every Day Smoker -- 0.50 packs/day    Types: Cigarettes  . Smokeless tobacco: Former Neurosurgeon     Comment: patient needs to quit   . Alcohol Use: 1.2 oz/week    2 Cans of beer per week     Comment:  two to three beers a week  . Drug Use: 7.00 per week    Special: Marijuana  . Sexual Activity: Yes    Partners: Female   Other Topics Concern  . Not on file   Social History Narrative  . No narrative on file    REVIEW OF SYSTEMS: Constitutional: No fevers, chills, or sweats, no generalized fatigue, change in appetite Eyes: No visual changes, double vision, eye pain Ear, nose and throat: No hearing loss, ear pain, nasal congestion, sore throat Cardiovascular: No chest pain, palpitations Respiratory:  No shortness of breath at rest or with exertion, wheezes GastrointestinaI: No nausea, vomiting, diarrhea, abdominal pain, fecal incontinence Genitourinary:  No  dysuria, urinary retention or frequency Musculoskeletal:  No neck pain, back pain Integumentary: No rash, pruritus, skin lesions Neurological: as above Psychiatric: No depression, insomnia, anxiety Endocrine: No palpitations, fatigue, diaphoresis, mood swings, change in appetite, change in weight, increased thirst Hematologic/Lymphatic:  No anemia, purpura, petechiae. Allergic/Immunologic: no itchy/runny eyes, nasal congestion, recent allergic reactions, rashes  PHYSICAL EXAM: Filed Vitals:   12/01/13 1107  BP: 150/82  Pulse: 82  Temp: 98.1 F (36.7 C)  Resp: 18   General: No acute distress Head:  Normocephalic/atraumatic Neck: supple, no paraspinal tenderness, full range of motion Heart:  Regular rate and rhythm Lungs:  Clear to auscultation bilaterally Back: No paraspinal tenderness Neurological Exam: alert and oriented to person, place, and time. Attention span and concentration intact, recent and remote memory intact, fund of knowledge intact.  Speech fluent and not dysarthric, language intact.  Reduced right V1-V3, otherwise CN II-XII intact. Fundoscopic exam unremarkable without vessel changes, exudates, hemorrhages or papilledema.  Bulk and tone normal, muscle strength 5-/5 right upper extremity and right hip flexion, complicated by pain.  Sensation to pinprick on right upper and lower extremities.  Vibration intact.  Deep tendon reflexes 2+ on left, 3+ on right, left toe downward, right toe equivocal.  Finger to noseintact.  Gait antalgic, Romberg with sway.  IMPRESSION: Relapsing-remitting MS  PLAN: 1.  Continue Tecfidera 2.  Start D3 4000 IU daily 3.  Refer to PT/OT 4.  Will change gabapentin to 600mg  tablet three times daily with 300mg  capsule three times daily (as he says the 600mg  tablet worked better). 5.  Must repeat MRI of brain and cervical spine with and without contrast. 6.  Follow up afterwards to discuss whether we need to switch Tecfidera to another  agent.  Shon Millet, DO  CC:  Billee Cashing, MD

## 2013-12-01 NOTE — Addendum Note (Signed)
Addended by: Glean SalenVAN DER GLAS, Divina Neale E on: 12/01/2013 11:53 AM   Modules accepted: Orders

## 2013-12-01 NOTE — Telephone Encounter (Signed)
[facility] still has 5 mg valium for patient

## 2013-12-01 NOTE — Telephone Encounter (Signed)
I spoke with patient he thought he had the scheduled MRI preformed however he was a no show for the appt on 11/26/13 . He is still coming for office visit because he is having some issues

## 2013-12-02 NOTE — Telephone Encounter (Signed)
Error

## 2013-12-03 ENCOUNTER — Encounter (HOSPITAL_COMMUNITY): Payer: Self-pay | Admitting: Emergency Medicine

## 2013-12-03 ENCOUNTER — Emergency Department (INDEPENDENT_AMBULATORY_CARE_PROVIDER_SITE_OTHER)
Admission: EM | Admit: 2013-12-03 | Discharge: 2013-12-03 | Disposition: A | Discharge status: Home / Self Care | Payer: Medicaid Other | Source: Home / Self Care

## 2013-12-03 ENCOUNTER — Telehealth: Payer: Self-pay | Admitting: Neurology

## 2013-12-03 DIAGNOSIS — G35 Multiple sclerosis: Secondary | ICD-10-CM

## 2013-12-03 MED ORDER — METHYLPREDNISOLONE (PAK) 4 MG PO TABS: ORAL_TABLET | ORAL | Status: DC

## 2013-12-03 NOTE — ED Notes (Signed)
C/o he is having flare up of his MS. Seen in office 6-15 , and medications were adjusted . C/o right side of body not functioning well , swollen

## 2013-12-03 NOTE — Discharge Instructions (Signed)
I have discussed your visit today with your neurologist, Dr. Everlena CooperJaffe, who recommended you take Medrol dose pack as prescribed and then follow up with his. Dr. Moises BloodJaffe's office also stated that they were in the process of scheduling several imaging exams for you and advised they would be in contact with you to let you know when those studies will be scheduled.   Multiple Sclerosis Multiple sclerosis (MS) is a disease of the central nervous system. It leads to the loss of the insulating covering of the nerves (myelin sheath) of your brain. When this happens, brain signals do not get sent properly or may not get sent at all. The age of onset of MS varies.  CAUSES The cause of MS is unknown. However, it is more common in the [place] than in the [place]. RISK FACTORS There is a higher number of women with MS than men. MS is not an illness that is passed down to you from your family members (inherited). However, your risk of MS is higher if you have a relative with MS. SIGNS AND SYMPTOMS  The symptoms of MS occur in episodes or attacks. These attacks may last weeks to months. There may be long periods of almost no symptoms between attacks. The symptoms of MS vary. This is because of the many different ways it affects the central nervous system. The main symptoms of MS include:  Vision problems and eye pain.  Numbness.  Weakness.  Inability to move your arms, hands, feet, or legs (paralysis).  Balance problems.  Tremors. DIAGNOSIS  Your health care provider can diagnose MS with the help of imaging exams and lab tests. These may include specialized X-ray exams and spinal fluid tests. The best imaging exam to confirm a diagnosis of MS is an MRI. TREATMENT  There is no known cure for MS, but there are medicines that can decrease the number and frequency of attacks. Steroids are often used for short-term relief. Physical and occupational therapy may also help. There are also  many new alternative or complementary treatments available to help control the symptoms of MS. Ask your health care provider if any of these other options are right for you. HOME CARE INSTRUCTIONS   Take medicines as directed by your health care provider.  Exercise as directed by your health care provider. SEEK MEDICAL CARE IF: You begin to feel depressed. SEEK IMMEDIATE MEDICAL CARE IF:  You develop paralysis.  You have problems with bladder, bowel, or sexual function.  You develop mental changes, such as forgetfulness or mood swings.  You have a period of uncontrolled movements (seizure). Document Released: 06/02/2000 Document Revised: 06/10/2013 Document Reviewed: 02/10/2013 [facility] Patient Information 2015 [place], [place]. This information is not intended to replace advice given to you by your health care provider. Make sure you discuss any questions you have with your health care provider.

## 2013-12-03 NOTE — Telephone Encounter (Signed)
Please call pt he states that he really needs to talk to someone [phone]

## 2013-12-03 NOTE — ED Provider Notes (Signed)
CSN: [id number]     Arrival date & time 12/03/13  4098 History   None    Chief Complaint  Patient presents with  . Neurologic Problem   (Consider location/radiation/quality/duration/timing/severity/associated sxs/prior Treatment) HPI Comments: Patient presents to clinic requesting oral steroids for MS flare. Was seen by his neurologist for same on 12-01-2013. Modifications made to his treatment regimen at that time, however, patient states symptoms have not improve. He does endorse that he has not yet filled the Rx for D3 prescribed at 12-01-2013 visit.  Reports that he is experiencing pain, swelling and weakness primarily of his right hand a forearm.   The history is provided by the patient.    Past Medical History  Diagnosis Date  . MS (multiple sclerosis)     dx 2006  . Bipolar 1 disorder   . Bipolar disorder, unspecified 01/03/2013   Past Surgical History  Procedure Laterality Date  . Tumor removal  1983    abdomen benign   Family History  Problem Relation Age of Onset  . Adopted: Yes  . Ataxia Neg Hx   . Chorea Neg Hx   . Dementia Neg Hx   . Mental retardation Neg Hx   . Migraines Neg Hx   . Multiple sclerosis Neg Hx   . Neurofibromatosis Neg Hx   . Neuropathy Neg Hx   . Parkinsonism Neg Hx   . Seizures Neg Hx   . Stroke Neg Hx    History  Substance Use Topics  . Smoking status: Current Every Day Smoker -- 0.50 packs/day    Types: Cigarettes  . Smokeless tobacco: Former Neurosurgeon     Comment: patient needs to quit   . Alcohol Use: 1.2 oz/week    2 Cans of beer per week     Comment:  two to three beers a week    Review of Systems  All other systems reviewed and are negative.   Allergies  Lithium  Home Medications   Prior to Admission medications   Medication Sig Start Date End Date Taking? Authorizing Bradley Brandt  baclofen (LIORESAL) 20 MG tablet Take 1 tablet (20 mg total) by mouth 3 (three) times daily. 01/07/13   Bradley Bong, MD  Dimethyl Fumarate  (TECFIDERA) 240 MG CPDR Take 240 mg by mouth 2 (two) times daily.    Historical Bradley Batdorf, MD  gabapentin (NEURONTIN) 300 MG capsule Take 1 capsule (300 mg total) by mouth 3 (three) times daily. 12/01/13   Bradley Gus Rankin, DO  gabapentin (NEURONTIN) 600 MG tablet Take 1 tablet (600 mg total) by mouth 3 (three) times daily. 12/01/13   Bradley Gus Rankin, DO  ibuprofen (ADVIL,MOTRIN) 400 MG tablet Take 1 tablet (400 mg total) by mouth every 6 (six) hours as needed. May give prior to rebif. 01/07/13   Bradley Bong, MD  meclizine (ANTIVERT) 25 MG tablet Take 1 tablet (25 mg total) by mouth 3 (three) times daily as needed for dizziness or nausea. 01/07/13   Bradley Bong, MD  methylPREDNIsolone (MEDROL DOSPACK) 4 MG tablet follow package directions 12/03/13   Bradley Rowan, PA  oxyCODONE (ROXICODONE) 15 MG immediate release tablet Take 15 mg by mouth every 6 (six) hours as needed for pain.    Historical Bradley Hoh, MD  predniSONE (DELTASONE) 5 MG tablet Take 4 tabs po QD day 1, 3 tabs po QD day 2, 2 tabs po QD day 3, 1 tab po QD day 4 then stop. 08/20/13   Bradley Rowan, PA  BP 148/71  Pulse 51  Temp(Src) 98.7 F (37.1 C) (Oral)  Resp 16  SpO2 98% Physical Exam  Nursing note and vitals reviewed. Constitutional: He is oriented to person, place, and time. He appears well-developed and well-nourished. No distress.  HENT:  Head: Normocephalic and atraumatic.  Eyes: Conjunctivae are normal. No scleral icterus.  Cardiovascular: Normal rate.   Pulmonary/Chest: Effort normal.  Musculoskeletal: Normal range of motion.  Mild STS of dorsum of right hand with no clinical indication of cellulitis or compartment syndrome. No CSM deficits of right upper extremity.  Neurological: He is alert and oriented to person, place, and time.  Skin: Skin is warm and dry. No rash noted. No erythema.  Psychiatric: He has a normal mood and affect. His behavior is normal.    ED Course  Procedures  (including critical care time) Labs Review Labs Reviewed - No data to display  Imaging Review No results found.   MDM   1. Relapsing remitting multiple sclerosis    Contacted patient's neurologist (Bradley Brandt) and he suggested patient be placed on Medrol Dose pack. Also advised his office is in the process of arranging repeat brain MRI for the patient and that office would contact patient to notify when exam scheduled.    Bradley Brandt, [place] 12/03/13 1102

## 2013-12-03 NOTE — Telephone Encounter (Signed)
I spoke with patient and PA Nedra Hai at Urgent care  Patient has left arm and rt hand pain weakness and edema . Per Dr Everlena Cooper he advised a steroid dose pak to treat this patient is scheduled for  MRI of Brain and C spine with a follow up appt after the scans

## 2013-12-03 NOTE — ED Provider Notes (Signed)
Medical screening examination/treatment/procedure(s) were performed by a resident physician or non-physician practitioner and as the supervising physician I was immediately available for consultation/collaboration.  Clementeen Graham, MD    Rodolph Bong, MD 12/03/13 Rosamaria Lints

## 2013-12-03 NOTE — Telephone Encounter (Signed)
Patient is at urgent care he is in pain he wants to stop his Tecfidera and restart Rebif injections . He also wants a steroid like a steroid pack he says . I advised him to see the Dr at the urgent care first and call our office back after the visit to see if we need to advise on anything else that might help with his pain . Please advise on the switch of Tecfidera and Rebif.

## 2013-12-04 ENCOUNTER — Telehealth: Payer: Self-pay | Admitting: *Deleted

## 2013-12-04 NOTE — Telephone Encounter (Signed)
Prior auth for Gabapentin 600 mg total daily  # 90  With 5 refills  Spoke with Lenn CalMartha  Auth number is [id number] valid until 11/29/14 [facility] notified

## 2013-12-13 ENCOUNTER — Ambulatory Visit
Admission: RE | Admit: 2013-12-13 | Discharge: 2013-12-13 | Disposition: A | Discharge status: Home / Self Care | Payer: Medicaid Other | Source: Ambulatory Visit | Attending: Neurology | Admitting: Neurology

## 2013-12-13 DIAGNOSIS — G35 Multiple sclerosis: Secondary | ICD-10-CM

## 2013-12-17 ENCOUNTER — Telehealth: Payer: Self-pay | Admitting: *Deleted

## 2013-12-17 NOTE — Telephone Encounter (Signed)
Patient did not keep have MRI preformed he states valium was not strong enough . We discussed him possibly seeing a MS Doctor and he agrees that this might be a good plan we will move forward with the referral and I will call him with an appt  Or let him know where we stand with that

## 2013-12-18 ENCOUNTER — Ambulatory Visit: Payer: Medicaid Other | Admitting: Neurology

## 2013-12-22 ENCOUNTER — Other Ambulatory Visit: Payer: Self-pay | Admitting: *Deleted

## 2013-12-22 DIAGNOSIS — G35 Multiple sclerosis: Secondary | ICD-10-CM

## 2013-12-23 ENCOUNTER — Telehealth: Payer: Self-pay | Admitting: *Deleted

## 2013-12-23 ENCOUNTER — Telehealth: Payer: Self-pay | Admitting: Neurology

## 2013-12-23 NOTE — Telephone Encounter (Signed)
Patient is aware of appt with Dr Leilani Merl on 03/18/14 at 8:15am at the [facility] office [phone])

## 2013-12-23 NOTE — Telephone Encounter (Signed)
Glennon MacBetsey is returning your call please call [phone]

## 2014-01-07 ENCOUNTER — Ambulatory Visit: Payer: Medicaid Other | Admitting: Neurology

## 2014-01-27 ENCOUNTER — Telehealth: Payer: Self-pay | Admitting: *Deleted

## 2014-01-27 NOTE — Telephone Encounter (Signed)
Refill for Tecidera 240 mg #60 called to [facility] with 3 refills only

## 2014-02-09 ENCOUNTER — Emergency Department (HOSPITAL_COMMUNITY)
Admission: EM | Admit: 2014-02-09 | Discharge: 2014-02-09 | Disposition: A | Discharge status: Home / Self Care | Payer: Medicaid Other | Attending: Emergency Medicine | Admitting: Emergency Medicine

## 2014-02-09 ENCOUNTER — Encounter (HOSPITAL_COMMUNITY): Payer: Self-pay | Admitting: Emergency Medicine

## 2014-02-09 DIAGNOSIS — Z79899 Other long term (current) drug therapy: Secondary | ICD-10-CM | POA: Insufficient documentation

## 2014-02-09 DIAGNOSIS — F1220 Cannabis dependence, uncomplicated: Secondary | ICD-10-CM | POA: Insufficient documentation

## 2014-02-09 DIAGNOSIS — F1920 Other psychoactive substance dependence, uncomplicated: Secondary | ICD-10-CM | POA: Insufficient documentation

## 2014-02-09 DIAGNOSIS — F3289 Other specified depressive episodes: Secondary | ICD-10-CM | POA: Insufficient documentation

## 2014-02-09 DIAGNOSIS — F319 Bipolar disorder, unspecified: Secondary | ICD-10-CM | POA: Insufficient documentation

## 2014-02-09 DIAGNOSIS — F39 Unspecified mood [affective] disorder: Secondary | ICD-10-CM | POA: Diagnosis not present

## 2014-02-09 DIAGNOSIS — F17200 Nicotine dependence, unspecified, uncomplicated: Secondary | ICD-10-CM | POA: Diagnosis not present

## 2014-02-09 DIAGNOSIS — F329 Major depressive disorder, single episode, unspecified: Secondary | ICD-10-CM | POA: Diagnosis not present

## 2014-02-09 DIAGNOSIS — G35 Multiple sclerosis: Secondary | ICD-10-CM | POA: Insufficient documentation

## 2014-02-09 LAB — CBC WITH DIFFERENTIAL/PLATELET
Basophils Absolute: 0 10*3/uL (ref 0.0–0.1)
Basophils Relative: 1 % (ref 0–1)
Eosinophils Absolute: 0.1 10*3/uL (ref 0.0–0.7)
Eosinophils Relative: 2 % (ref 0–5)
HEMATOCRIT: 43.9 % (ref 39.0–52.0)
HEMOGLOBIN: 15.1 g/dL (ref 13.0–17.0)
LYMPHS ABS: 2.7 10*3/uL (ref 0.7–4.0)
LYMPHS PCT: 45 % (ref 12–46)
MCH: 32.4 pg (ref 26.0–34.0)
MCHC: 34.4 g/dL (ref 30.0–36.0)
MCV: 94.2 fL (ref 78.0–100.0)
MONO ABS: 0.4 10*3/uL (ref 0.1–1.0)
Monocytes Relative: 6 % (ref 3–12)
Neutro Abs: 2.8 10*3/uL (ref 1.7–7.7)
Neutrophils Relative %: 46 % (ref 43–77)
Platelets: 215 10*3/uL (ref 150–400)
RBC: 4.66 MIL/uL (ref 4.22–5.81)
RDW: 12.9 % (ref 11.5–15.5)
WBC: 5.9 10*3/uL (ref 4.0–10.5)

## 2014-02-09 LAB — BASIC METABOLIC PANEL
Anion gap: 14 (ref 5–15)
BUN: 14 mg/dL (ref 6–23)
CALCIUM: 9.6 mg/dL (ref 8.4–10.5)
CO2: 23 meq/L (ref 19–32)
CREATININE: 1.07 mg/dL (ref 0.50–1.35)
Chloride: 103 mEq/L (ref 96–112)
GFR calc Af Amer: >90 mL/min (ref >90)
GFR calc non Af Amer: 88 mL/min — ABNORMAL LOW (ref >90)
GLUCOSE: 124 mg/dL — AB (ref 70–99)
Potassium: 4.1 mEq/L (ref 3.7–5.3)
Sodium: 140 mEq/L (ref 137–147)

## 2014-02-09 LAB — RAPID URINE DRUG SCREEN, HOSP PERFORMED
AMPHETAMINES: NOT DETECTED
Barbiturates: NOT DETECTED
Benzodiazepines: NOT DETECTED
COCAINE: NOT DETECTED
OPIATES: NOT DETECTED
Tetrahydrocannabinol: POSITIVE — AB

## 2014-02-09 LAB — ACETAMINOPHEN LEVEL

## 2014-02-09 LAB — SALICYLATE LEVEL: Salicylate Lvl: <2 mg/dL — ABNORMAL LOW (ref 2.8–20.0)

## 2014-02-09 LAB — ETHANOL

## 2014-02-09 MED ORDER — ONDANSETRON HCL 4 MG PO TABS: 4.0000 mg | ORAL_TABLET | Freq: Three times a day (TID) | ORAL | Status: DC | PRN

## 2014-02-09 MED ORDER — LORAZEPAM 1 MG PO TABS: 1.0000 mg | ORAL_TABLET | Freq: Three times a day (TID) | ORAL | Status: DC | PRN

## 2014-02-09 NOTE — ED Notes (Signed)
Made patient aware that we need a urine sample and he states he is unable to give one at this time
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
## 2014-02-09 NOTE — Discharge Instructions (Signed)
Chemical Dependency  Chemical dependency is an addiction to drugs or alcohol. It is characterized by the repeated behavior of seeking out and using drugs and alcohol despite harmful consequences to the health and safety of ones self and others.   RISK FACTORS  There are certain situations or behaviors that increase a person's risk for chemical dependency. These include:  · A family history of chemical dependency.  · A history of mental health issues, including depression and anxiety.  · A home environment where drugs and alcohol are easily available to you.  · Drug or alcohol use at a young age.  SYMPTOMS   The following symptoms can indicate chemical dependency:  · Inability to limit the use of drugs or alcohol.  · Nausea, sweating, shakiness, and anxiety that occurs when alcohol or drugs are not being used.  · An increase in amount of drugs or alcohol that is necessary to get drunk or high.  People who experience these symptoms can assess their use of drugs and alcohol by asking themselves the following questions:  · Have you been told by friends or family that they are worried about your use of alcohol or drugs?  · Do friends and family ever tell you about things you did while drinking alcohol or using drugs that you do not remember?  · Do you lie about using alcohol or drugs or about the amounts you use?  · Do you have difficulty completing daily tasks unless you use alcohol or drugs?  · Is the level of your work or school performance lower because of your drug or alcohol use?  · Do you get sick from using drugs or alcohol but keep using anyway?  · Do you feel uncomfortable in social situations unless you use alcohol or drugs?  · Do you use drugs or alcohol to help forget problems?   An answer of yes to any of these questions may indicate chemical dependency. Professional evaluation is suggested.  Document Released: 05/30/2001 Document Revised: 08/28/2011 Document Reviewed: 08/11/2010  ExitCare® Patient  Information ©2015 Exit[employer], LLC. This information is not intended to replace advice given to you by your health care provider. Make sure you discuss any questions you have with your health care provider.

## 2014-02-09 NOTE — ED Notes (Signed)
Pt states he wants detox from cocaine and marijuana, pt also states he is depressed.

## 2014-02-09 NOTE — ED Notes (Signed)
Pt family member has his belongings

## 2014-02-09 NOTE — ED Notes (Signed)
Bed: [facility] Expected date:  Expected time:  Means of arrival:  Comments: 

## 2014-02-09 NOTE — ED Notes (Signed)
Pt belongings given to family. 
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
## 2014-02-11 NOTE — ED Provider Notes (Signed)
Medical screening examination/treatment/procedure(s) were performed by non-physician practitioner and as supervising physician I was immediately available for consultation/collaboration.    Linwood Dibbles, MD 02/11/14 [phone]

## 2014-05-27 ENCOUNTER — Telehealth: Payer: Self-pay | Admitting: *Deleted

## 2014-05-27 NOTE — Telephone Encounter (Signed)
Patient is aware the has appt with Dr Epimenio FootSater on 06/24/14 1:30pm [address] [place]

## 2014-06-04 ENCOUNTER — Telehealth: Payer: Self-pay | Admitting: *Deleted

## 2014-06-04 NOTE — Telephone Encounter (Signed)
I spoke with [name] from [facility]  About this patient we have referred him to Dr Despina Ariasichard Sater he missed his first appt in August and now has one Jun 10 2014  Dr number was given to [place]

## 2014-06-04 NOTE — Telephone Encounter (Signed)
This was sent to me. Dr Everlena Cooper patient.

## 2014-06-04 NOTE — Telephone Encounter (Signed)
francheka from [facility] pharmacy called in referance to a prior auth  Call back number [phone]

## 2014-06-15 ENCOUNTER — Telehealth: Payer: Self-pay | Admitting: *Deleted

## 2014-06-15 ENCOUNTER — Telehealth: Payer: Self-pay | Admitting: Neurology

## 2014-06-15 NOTE — Telephone Encounter (Signed)
Pt has an appt with DR. Sater at [facility] on 1/36/16. Pt stated that when pt called [facility], They informed him that Dr. Bonnita Hollow last day at [facility] was on 06/14/14.  Please call pt # [phone]

## 2014-06-15 NOTE — Telephone Encounter (Signed)
Left message with patient to keep appt on 1/16 with Dr Leilani Merl he is not longer part of [facility] he has joined [facility] a of 12 /27/15

## 2014-06-24 ENCOUNTER — Ambulatory Visit: Payer: Self-pay | Admitting: Neurology

## 2014-06-25 ENCOUNTER — Telehealth: Payer: Self-pay | Admitting: *Deleted

## 2014-06-25 ENCOUNTER — Telehealth: Payer: Self-pay | Admitting: Neurology

## 2014-06-25 NOTE — Telephone Encounter (Signed)
Patient is aware he has an appt with DR Leilani Merl on 1/16 /16 at [facility]  he will be taking care of all medications relating to his MS  this is the second appt that has been scheduled for him . He did not keep the first one

## 2014-06-25 NOTE — Telephone Encounter (Signed)
Pt called stating that Dr. Epimenio Foot left [facility] and he needs a new provider and also a refill for his meds. C/b [phone]

## 2014-07-01 ENCOUNTER — Emergency Department (HOSPITAL_COMMUNITY)
Admission: EM | Admit: 2014-07-01 | Discharge: 2014-07-01 | Disposition: A | Discharge status: Home / Self Care | Payer: Medicaid Other | Attending: Emergency Medicine | Admitting: Emergency Medicine

## 2014-07-01 ENCOUNTER — Encounter (HOSPITAL_COMMUNITY): Payer: Self-pay

## 2014-07-01 DIAGNOSIS — G35 Multiple sclerosis: Secondary | ICD-10-CM | POA: Insufficient documentation

## 2014-07-01 DIAGNOSIS — Z720 Tobacco use: Secondary | ICD-10-CM | POA: Insufficient documentation

## 2014-07-01 DIAGNOSIS — Z79899 Other long term (current) drug therapy: Secondary | ICD-10-CM | POA: Insufficient documentation

## 2014-07-01 DIAGNOSIS — Z8659 Personal history of other mental and behavioral disorders: Secondary | ICD-10-CM | POA: Diagnosis not present

## 2014-07-01 DIAGNOSIS — R200 Anesthesia of skin: Secondary | ICD-10-CM | POA: Diagnosis present

## 2014-07-01 MED ORDER — SODIUM CHLORIDE 0.9 % IV SOLN
1000.0000 mg | Freq: Once | INTRAVENOUS | Status: AC
  Administered 2014-07-01: 1000 mg via INTRAVENOUS
  Filled 2014-07-01: qty 8

## 2014-07-01 NOTE — Discharge Instructions (Signed)
° °  Follow up at 1030 tomorrow at Short Stay. 11am on Friday  Multiple Sclerosis Multiple sclerosis (MS) is a disease of the central nervous system. It leads to the loss of the insulating covering of the nerves (myelin sheath) of your brain. When this happens, brain signals do not get sent properly or may not get sent at all. The age of onset of MS varies.  CAUSES The cause of MS is unknown. However, it is more common in the [place] than in the [place]. RISK FACTORS There is a higher number of women with MS than men. MS is not an illness that is passed down to you from your family members (inherited). However, your risk of MS is higher if you have a relative with MS. SIGNS AND SYMPTOMS  The symptoms of MS occur in episodes or attacks. These attacks may last weeks to months. There may be long periods of almost no symptoms between attacks. The symptoms of MS vary. This is because of the many different ways it affects the central nervous system. The main symptoms of MS include:  Vision problems and eye pain.  Numbness.  Weakness.  Inability to move your arms, hands, feet, or legs (paralysis).  Balance problems.  Tremors. DIAGNOSIS  Your health care provider can diagnose MS with the help of imaging exams and lab tests. These may include specialized X-ray exams and spinal fluid tests. The best imaging exam to confirm a diagnosis of MS is an MRI. TREATMENT  There is no known cure for MS, but there are medicines that can decrease the number and frequency of attacks. Steroids are often used for short-term relief. Physical and occupational therapy may also help. There are also many new alternative or complementary treatments available to help control the symptoms of MS. Ask your health care provider if any of these other options are right for you. HOME CARE INSTRUCTIONS   Take medicines as directed by your health care provider.  Exercise as directed by your health  care provider. SEEK MEDICAL CARE IF: You begin to feel depressed. SEEK IMMEDIATE MEDICAL CARE IF:  You develop paralysis.  You have problems with bladder, bowel, or sexual function.  You develop mental changes, such as forgetfulness or mood swings.  You have a period of uncontrolled movements (seizure). Document Released: 06/02/2000 Document Revised: 06/10/2013 Document Reviewed: 02/10/2013 [facility] Patient Information 2015 [place], [place]. This information is not intended to replace advice given to you by your health care provider. Make sure you discuss any questions you have with your health care provider.

## 2014-07-01 NOTE — Progress Notes (Signed)
  CARE MANAGEMENT ED NOTE 07/01/2014  Patient:  ANDE, PRODAN   Account Number:  [id number]  Date Initiated:  07/01/2014  Documentation initiated by:  Edd Arbour  Subjective/Objective Assessment:   34 yr old medicaid [name] right-sided paresthesias of his face and right upper and lower extremities x 4 days.  He cites hx of M.S. And states a similar episode occurred some 2 years ago. c/o numbness, weakness     Subjective/Objective Assessment Detail:   pcp [facility]  Neurology (per EPIC notes indicates pt has been seen by Dr Everlena Cooper of [facility] in 2015 (missed appt in August 2015), then pt seen by Dr Despina Arias of [facility] [phone]) [facility] Missed appt 06/10/14 & 06/24/14. Pt thought the appt was for July 04 2014 (There is a 06/25/14 telephone EPIC note by Pamalee Leyden Der Glas, LPN  indicating pt appt was for 07/04/14)  Steward Drone G informed CM 07/01/13 at 1113 that the pt "does not have any future appointments" Referred to Diane [facility] new pt coordinator who requests fax referral to [phone] or [phone]  EDP states he did reach [facility] and was informed they did not do steriod infusions Cm requested Steward Drone cancel call from Restaurant manager, fast food at 1116     Action/Plan:   ED CM consulted by EDP Ruel Favors about assist to get pt possible steriod infusions via short stay. Cm recommended EDP consult pt present neurologist.  CM spoke with pt & male at bedside to assess names of pcp & present neurologist Cm   Action/Plan Detail:   spoke with Steward Drone to attempt to connect EDP & Sater for pt care.  Cm called short stay [phone] spoke with [facility] Provided EDP with short stay [phone] to call for orders.  Cm fax new [facility] pt to ATTN Diane Pt updated   Anticipated DC Date:       Status Recommendation to Physician:   Result of Recommendation:    Other ED Services  Consult Working Plan    DC Planning Services  Other  Outpatient Services - Pt will  follow up    Choice offered to / List presented to:            Status of service:  Completed, signed off  ED Comments:   ED Comments Detail:  07/01/14 1120 EDP states he did reach [facility] and was informed they did not do steriod infusions Cm requested Steward Drone cancel call from Restaurant manager, fast food at [employer], Richard A. Call on 07/02/2014 A new patient referral has been sent to [facility] for you They will call you with a new appointment but you can call them at any time to follow up also  [address] [place] [address] [phone]  Billee Cashing  As needed [address] [place] [address] [phone]

## 2014-07-01 NOTE — ED Notes (Signed)
He states he has had right-sided paresthesias of his face and right upper and lower extremities x 4 days.  He cites hx of M.S. And states a similar episode occurred some 2 years ago.  He is alert and oriented x 4 with clear speech.

## 2014-07-01 NOTE — ED Provider Notes (Signed)
CSN: [id number]     Arrival date & time 07/01/14  3086 History   First MD Initiated Contact with Patient 07/01/14 1001     Chief Complaint  Patient presents with  . Numbness     (Consider location/radiation/quality/duration/timing/severity/associated sxs/prior Treatment) The history is provided by the patient.   patient with a history of relapsing remitting MS. He often has right sided numbness and weakness but states he has had on the left also. He has had some symptoms for last month but worse over last week. He states his primary care doctor gave him what sounds a prednisone Dosepak. He states he got no better. He has a neurologist but switching from one practice to another and states he hasn't been able to see them recently. He is on a controlling medicine. No fevers. No dysuria. No headache. He states he has a sense of slight fullness in his abdomen. No nausea or vomiting. No diarrhea. No fevers. No dysuria.  Past Medical History  Diagnosis Date  . MS (multiple sclerosis) dx 2006    relapsing-remitting right sided weakness  . Bipolar 1 disorder   . Bipolar disorder, unspecified 01/03/2013   Past Surgical History  Procedure Laterality Date  . Tumor removal  1983    abdomen benign   Family History  Problem Relation Age of Onset  . Adopted: Yes  . Ataxia Neg Hx   . Chorea Neg Hx   . Dementia Neg Hx   . Mental retardation Neg Hx   . Migraines Neg Hx   . Multiple sclerosis Neg Hx   . Neurofibromatosis Neg Hx   . Neuropathy Neg Hx   . Parkinsonism Neg Hx   . Seizures Neg Hx   . Stroke Neg Hx    History  Substance Use Topics  . Smoking status: Current Every Day Smoker -- 0.50 packs/day    Types: Cigarettes  . Smokeless tobacco: Former Neurosurgeon     Comment: patient needs to quit   . Alcohol Use: 1.2 oz/week    2 Cans of beer per week     Comment:  two to three beers a week    Review of Systems  Constitutional: Negative for activity change and appetite change.  Eyes:  Negative for pain.  Respiratory: Negative for chest tightness and shortness of breath.   Cardiovascular: Negative for chest pain and leg swelling.  Gastrointestinal: Negative for nausea, vomiting, abdominal pain and diarrhea.  Genitourinary: Negative for flank pain.  Musculoskeletal: Negative for back pain and neck stiffness.  Skin: Negative for rash.  Neurological: Positive for weakness and numbness. Negative for headaches.  Psychiatric/Behavioral: Negative for behavioral problems.      Allergies  Lithium  Home Medications   Prior to Admission medications   Medication Sig Start Date End Date Taking? Authorizing Provider  Dimethyl Fumarate (TECFIDERA) 240 MG CPDR Take 240 mg by mouth 2 (two) times daily.   Yes Historical Provider, MD  guaiFENesin (MUCINEX) 600 MG 12 hr tablet Take 600 mg by mouth 2 (two) times daily as needed for cough (cough).   Yes Historical Provider, MD  oxyCODONE (ROXICODONE) 15 MG immediate release tablet Take 15 mg by mouth every 6 (six) hours as needed for pain (pain).    Yes Historical Provider, MD  tiZANidine (ZANAFLEX) 4 MG tablet Take 8 mg by mouth 2 (two) times daily.   Yes Historical Provider, MD   BP 145/58 mmHg  Pulse 60  Temp(Src) 98.4 F (36.9 C) (Oral)  Resp 16  SpO2 98% Physical Exam  Constitutional: He is oriented to person, place, and time. He appears well-developed and well-nourished.  Cardiovascular: Normal rate and regular rhythm.   Pulmonary/Chest: Effort normal.  Abdominal: Soft. There is no tenderness.  Neurological: He is alert and oriented to person, place, and time.  Decreased sensation to right face right hand and right lower leg. Some facial droop and slightly decreased strength on right side and right lower extremity. Strength 4/2-5 on right side upper extremity. Some decreased strength with straight leg raise on right.  Skin: Skin is warm.  Psychiatric: He has a normal mood and affect.    ED Course  Procedures (including  critical care time) Labs Review Labs Reviewed - No data to display  Imaging Review No results found.   EKG Interpretation None      MDM   Final diagnoses:  Relapsing remitting multiple sclerosis    Patient with right-sided weakness for the last week or 2. Has a history of MS was similar symptoms in the past.    reportedly got prednisone Dosepak by his PCP without any improvement of symptoms. Patient is attempting to transition from one neurologist to another. is reportedly missed the last 2 visits. He appears to need IV Solu-Medrol. I discussed with neurologist in the hospital. Will give 3 day course of 1 g of Solu-Medrol each day. I discussed with his new neurology office and they cannot set it up or give it in the office. I discussed with short stay and have ordered the medicine to be infused over the next 2 days.  Juliet Rude. Rubin Payor, MD 07/01/14 1642

## 2014-07-02 ENCOUNTER — Encounter (HOSPITAL_COMMUNITY): Payer: Self-pay

## 2014-07-02 ENCOUNTER — Encounter (HOSPITAL_COMMUNITY)
Admit: 2014-07-02 | Discharge: 2014-07-02 | Disposition: A | Discharge status: Home / Self Care | Payer: Medicaid Other | Source: Ambulatory Visit | Attending: Emergency Medicine | Admitting: Emergency Medicine

## 2014-07-02 DIAGNOSIS — G35 Multiple sclerosis: Secondary | ICD-10-CM | POA: Insufficient documentation

## 2014-07-02 MED ORDER — SODIUM CHLORIDE 0.9 % IV SOLN
1000.0000 mg | INTRAVENOUS | Status: DC
  Administered 2014-07-02: 1000 mg via INTRAVENOUS
  Filled 2014-07-02 (×2): qty 8

## 2014-07-02 MED ORDER — SODIUM CHLORIDE 0.9 % IV SOLN
Freq: Every day | INTRAVENOUS | Status: AC
  Administered 2014-07-02: 11:00:00 via INTRAVENOUS

## 2014-07-02 NOTE — Progress Notes (Addendum)
7616 ED CM returned a call to [phone] extension [phone] and left Diane a voice message thanking her for her response and appreciation of her time with attempting to assist pt  ED CM receive voice messages from Diane at [facility]) left on 07/01/14 at 1655 and 07/02/14 at 0825 stating she received pt new pt referral from CM but pt has been dismissed from [facility] and she spoke with pt on 07/01/14 to inform him of this

## 2014-07-02 NOTE — Discharge Instructions (Signed)
Methylprednisolone Solution for Injection °What is this medicine? °METHYLPREDNISOLONE (meth ill pred NISS oh lone) is a corticosteroid. It is commonly used to treat inflammation of the skin, joints, lungs, and other organs. Common conditions treated include asthma, allergies, and arthritis. It is also used for other conditions, such as blood disorders and diseases of the adrenal glands. °This medicine may be used for other purposes; ask your health care provider or pharmacist if you have questions. °COMMON BRAND NAME(S): A-Methapred, Solu-Medrol °What should I tell my health care provider before I take this medicine? °They need to know if you have any of these conditions: °-cataracts or glaucoma °-Cushing's syndrome °-heart disease °-high blood pressure °-infection including tuberculosis °-low calcium or potassium levels in the blood °-recent surgery °-seizures °-stomach or intestinal disease, including colitis °-threadworms °-thyroid problems °-an unusual or allergic reaction to methylprednisolone, corticosteroids, benzyl alcohol, other medicines, foods, dyes, or preservatives °-pregnant or trying to get pregnant °-breast-feeding °How should I use this medicine? °This medicine is for injection or infusion into a vein. It is also for injection into a muscle. It is given by a health care professional in a hospital or clinic setting. °Talk to your pediatrician regarding the use of this medicine in children. While this drug may be prescribed for selected conditions, precautions do apply. °Overdosage: If you think you have taken too much of this medicine contact a poison control center or emergency room at once. °NOTE: This medicine is only for you. Do not share this medicine with others. °What if I miss a dose? °This does not apply. °What may interact with this medicine? °Do not take this medicine with any of the following medications: °-mifepristone °This medicine may also interact with the following  medications: °-aspirin and aspirin-like medicines °-cyclosporin °-ketoconazole °-phenobarbital °-phenytoin °-rifampin °-tacrolimus °-troleandomycin °-vaccines °-warfarin °This list may not describe all possible interactions. Give your health care provider a list of all the medicines, herbs, non-prescription drugs, or dietary supplements you use. Also tell them if you smoke, drink alcohol, or use illegal drugs. Some items may interact with your medicine. °What should I watch for while using this medicine? °Visit your doctor or health care professional for regular checks on your progress. If you are taking this medicine for a long time, carry an identification card with your name and address, the type and dose of your medicine, and your doctor's name and address. °The medicine may increase your risk of getting an infection. Stay away from people who are sick. Tell your doctor or health care professional if you are around anyone with measles or chickenpox. °You may need to avoid some vaccines. Talk to your health care provider for more information. °If you are going to have surgery, tell your doctor or health care professional that you have taken this medicine within the last twelve months. °Ask your doctor or health care professional about your diet. You may need to lower the amount of salt you eat. °The medicine can increase your blood sugar. If you are a diabetic check with your doctor if you need help adjusting the dose of your diabetic medicine. °What side effects may I notice from receiving this medicine? °Side effects that you should report to your doctor or health care professional as soon as possible: °-allergic reactions like skin rash, itching or hives, swelling of the face, lips, or tongue °-bloody or tarry stools °-changes in vision °-eye pain or bulging eyes °-fever, sore throat, sneezing, cough, or other signs of infection, wounds that will   not heal °-increased thirst °-irregular heartbeat °-muscle  cramps °-pain in hips, back, ribs, arms, shoulders, or legs °-swelling of the ankles, feet, hands °-trouble passing urine or change in the amount of urine °-unusual bleeding or bruising °-unusually weak or tired °-weight gain or weight loss °Side effects that usually do not require medical attention (report to your doctor or health care professional if they continue or are bothersome): °-changes in emotions or moods °-constipation or diarrhea °-headache °-irritation at site where injected °-nausea, vomiting °-skin problems, acne, thin and shiny skin °-trouble sleeping °-unusual hair growth on the face or body °This list may not describe all possible side effects. Call your doctor for medical advice about side effects. You may report side effects to FDA at 1-800-FDA-1088. °Where should I keep my medicine? °This drug is given in a hospital or clinic and will not be stored at home. °NOTE: This sheet is a summary. It may not cover all possible information. If you have questions about this medicine, talk to your doctor, pharmacist, or health care provider. °© 2015, [employer]. (2012-03-05 11:37:16) ° ° °

## 2014-07-03 ENCOUNTER — Encounter (HOSPITAL_COMMUNITY): Payer: Self-pay

## 2014-07-03 ENCOUNTER — Encounter (HOSPITAL_COMMUNITY)
Admit: 2014-07-03 | Discharge: 2014-07-03 | Disposition: A | Discharge status: Home / Self Care | Payer: Medicaid Other | Attending: Emergency Medicine | Admitting: Emergency Medicine

## 2014-07-03 DIAGNOSIS — G35 Multiple sclerosis: Secondary | ICD-10-CM | POA: Diagnosis not present

## 2014-07-03 MED ORDER — SODIUM CHLORIDE 0.9 % IV SOLN
1000.0000 mg | INTRAVENOUS | Status: AC
  Administered 2014-07-03: 1000 mg via INTRAVENOUS
  Filled 2014-07-03: qty 8

## 2014-07-03 MED ORDER — SODIUM CHLORIDE 0.9 % IV SOLN
Freq: Once | INTRAVENOUS | Status: AC
  Administered 2014-07-03: 11:00:00 via INTRAVENOUS

## 2014-07-06 ENCOUNTER — Telehealth: Payer: Self-pay | Admitting: Neurology

## 2014-07-06 NOTE — Telephone Encounter (Signed)
Pt wants to talk to someone about medication please call [phone]

## 2014-07-07 ENCOUNTER — Telehealth: Payer: Self-pay | Admitting: *Deleted

## 2014-07-07 NOTE — Telephone Encounter (Signed)
I have spoke with this patient X2 today He was scheduled with Dr Leilani Merl at [facility] to begin with and was to start seeing Dr Leilani Merl after his moe to [facility]  both appt were made the first one he had a transportation issue however they did agree to see him  In Jan 07/02/14. [facility] is now telling patient they will not see him as he was non complaint back in 2014 However this appt was made with Dr Leilani Merl before coming to [facility] .

## 2014-07-14 ENCOUNTER — Telehealth: Payer: Self-pay | Admitting: Neurology

## 2014-07-14 ENCOUNTER — Telehealth: Payer: Self-pay | Admitting: *Deleted

## 2014-07-14 NOTE — Telephone Encounter (Signed)
Patient is aware he has appt with Dr Quintella Baton Macarthur Critchley at [facility] in [place] on 07/21/14 at 3pm  records were faxed to [facility]  at clinic

## 2014-07-14 NOTE — Telephone Encounter (Signed)
Pt called requesting a refill for [facility] 240MG  Pharmacy: [facility] [address]  C/B [phone]

## 2014-07-20 ENCOUNTER — Telehealth: Payer: Self-pay | Admitting: *Deleted

## 2014-07-20 NOTE — Telephone Encounter (Signed)
Pharmacy called to let us know that patient has not had Tecfidera filled since November.  He is going to start back in February.

## 2014-07-21 ENCOUNTER — Ambulatory Visit: Payer: Self-pay | Admitting: Neurology

## 2014-09-28 DIAGNOSIS — M5440 Lumbago with sciatica, unspecified side: Secondary | ICD-10-CM | POA: Insufficient documentation

## 2014-10-08 ENCOUNTER — Other Ambulatory Visit: Payer: Self-pay | Admitting: Physical Medicine and Rehabilitation

## 2014-10-08 ENCOUNTER — Ambulatory Visit
Admission: RE | Admit: 2014-10-08 | Discharge: 2014-10-08 | Disposition: A | Discharge status: Home / Self Care | Payer: Medicaid Other | Source: Ambulatory Visit | Attending: Physical Medicine and Rehabilitation | Admitting: Physical Medicine and Rehabilitation

## 2014-10-08 DIAGNOSIS — G8929 Other chronic pain: Secondary | ICD-10-CM

## 2014-10-08 DIAGNOSIS — M549 Dorsalgia, unspecified: Principal | ICD-10-CM

## 2015-12-16 ENCOUNTER — Encounter: Payer: Self-pay | Admitting: Neurology

## 2015-12-16 ENCOUNTER — Ambulatory Visit (INDEPENDENT_AMBULATORY_CARE_PROVIDER_SITE_OTHER): Payer: Medicaid Other | Admitting: Neurology

## 2015-12-16 ENCOUNTER — Telehealth: Payer: Self-pay | Admitting: *Deleted

## 2015-12-16 DIAGNOSIS — R261 Paralytic gait: Secondary | ICD-10-CM

## 2015-12-16 DIAGNOSIS — F3170 Bipolar disorder, currently in remission, most recent episode unspecified: Secondary | ICD-10-CM | POA: Diagnosis not present

## 2015-12-16 DIAGNOSIS — R3911 Hesitancy of micturition: Secondary | ICD-10-CM | POA: Diagnosis not present

## 2015-12-16 DIAGNOSIS — G8190 Hemiplegia, unspecified affecting unspecified side: Secondary | ICD-10-CM | POA: Diagnosis not present

## 2015-12-16 DIAGNOSIS — G35 Multiple sclerosis: Secondary | ICD-10-CM

## 2015-12-16 DIAGNOSIS — R200 Anesthesia of skin: Secondary | ICD-10-CM | POA: Diagnosis not present

## 2015-12-16 NOTE — Telephone Encounter (Signed)
Pt. is transferring infusion sites from [facility] to [facility].  His next Tysabri infusion is scheduled at [facility] on 12-30-15 at 1pm, arrival time of 1245.  I have given appt. details, address and phone # for [facility] to the officers that accompanied him to his appt. today, and have given a separate copy to the pt. in the event he is no longer incarcerated at the time of his appt. I have spoken with [employer] and requested rx'ing provider be changed to [facility], and Touch auth be pulled from [facility] to [facility]/fim

## 2015-12-16 NOTE — Progress Notes (Signed)
[name]  PATIENT: Bradley Brandt DOB: 11-02-1980  REFERRING DOCTOR OR PCP:  Thomes Dinning SOURCE: patient, records form Dr. Gaynelle Adu, MRI reports and images on PACS  _________________________________   HISTORICAL  CHIEF COMPLAINT:  Chief Complaint  Patient presents with  . Multiple Sclerosis    Bradley Brandt is here today for eval of MS.  He is in police custody.  Sts. he was diagnosed around 2001.  Presenting sx. was right sided numbness.  He is a poor historian, but believes he was first started on Avonex, then Rebif, but stopped both due to progreesion of sx. or relapse.  He believes he has been on Tysabri for about a yr.  and sts. last infusion was at [facility] on 11-08-15.  He is not sure when his last mri was. Sts. he is not sure what his JCV ab status is or when he was last tested. Sts. he has   . Numbness    numbness in his right leg below the knee.  He also c/o mid to lower back pain radiating down the front of his right leg to the knee./fim  . High Risk Medication    Sts. he has also been on Tecfidera in the past but stopped due to gi side effects/fim    HISTORY OF PRESENT ILLNESS:  I had the pleasure seeing you patient, Bradley Brandt, at [facility] for neurologic consultation regarding his multiple sclerosis.   He is currently on Tysabri. He tolerates it well and feels that his MS has done better on Tysabri than any other medication. He also notes that it helps his fatigue. He reports being JCV antibody negative  MS history: Bradley Brandt was diagnosed around 2001 when he presented with right-sided numbness and clumsiness and weakness.   Initially, he was on Avonex and then Rebif but had relapses on both of them.  He could not tolerate Tecfidera.   He was started on Tysabri about 1 year ago and his last infusion was at [facility] at 11/08/2015.  He was seen Dr. Gaynelle Adu [facility] and those records were reviewed. He did  receive a course of Solu-Medrol March 2015 and another course of Solu-Medrol January 2016 when he presented with left leg weakness and numbness that was new    An MRI of the brain 08/28/2013 personally reviewed shows old extensive white matter changes in both hemispheres with multiple sclerosis. There were new areas of restricted diffusion without definite enhancement that could be consistent with ongoing demyelination when compared to the MRI from 05/13/2013.  Gait/strength/sensation: He has difficulty with his gait and walk without a cane. He notes spasticity in both legs. He feels that there is chronic mild right leg weakness but that the left leg weakness got worse more recently.   He notes some tingling in the feet but does not feel that it is painful.  Vision: He notes vision improved when he got new glasses He feels left and right eye are equal. He has not noted changes in color vision. He notes no diplopia. .   Bladder: He notes increased urinary frequency but no incontinence.  He has hesitancy and does not feel he empties out completely. Sometimes he pushes on his bladder to empty better.  Fatigue/sleep: He notes fatigue and sleepiness in the afternoons. This improved since starting Tysabri last year., The last week of every Tysabri cycle he feels more tired. He is overdue by several days for his Tysabri and feels  more tired this week. She well at night.  Mood/cognition: He denies any depression or anxiety now but had some in the past.   He has been diagnosed with bipolar disease and is on a psychiatric medicine but does not know the name.. Notes some decreased focus and attention but notes no major cognitive problems.     REVIEW OF SYSTEMS: Constitutional: No fevers, chills, sweats, or change in appetite.   Notes fatigue Eyes: No visual changes, double vision, eye pain Ear, nose and throat: No hearing loss, ear pain, nasal congestion, sore throat Cardiovascular: No chest pain,  palpitations Respiratory: No shortness of breath at rest or with exertion.   No wheezes GastrointestinaI: No nausea, vomiting, diarrhea, abdominal pain, fecal incontinence Genitourinary: He has hesitancy and frequency.   No UTI's. Musculoskeletal: No neck pain, back pain Integumentary: No rash, pruritus, skin lesions Neurological: as above Psychiatric: No depression at this time.  No anxiety Endocrine: No palpitations, diaphoresis, change in appetite, change in weigh or increased thirst Hematologic/Lymphatic: No anemia, purpura, petechiae. Allergic/Immunologic: No itchy/runny eyes, nasal congestion, recent allergic reactions, rashes  ALLERGIES: Allergies  Allergen Reactions  . Lithium Rash    HOME MEDICATIONS: No current outpatient prescriptions on file.  PAST MEDICAL HISTORY: Past Medical History  Diagnosis Date  . MS (multiple sclerosis) (HCC) dx 2006    relapsing-remitting right sided weakness  . Bipolar 1 disorder (HCC)   . Bipolar disorder, unspecified (HCC) 01/03/2013    PAST SURGICAL HISTORY: Past Surgical History  Procedure Laterality Date  . Tumor removal  1983    abdomen benign    FAMILY HISTORY: Family History  Problem Relation Age of Onset  . Adopted: Yes  . Ataxia Neg Hx   . Chorea Neg Hx   . Dementia Neg Hx   . Mental retardation Neg Hx   . Migraines Neg Hx   . Multiple sclerosis Neg Hx   . Neurofibromatosis Neg Hx   . Neuropathy Neg Hx   . Parkinsonism Neg Hx   . Seizures Neg Hx   . Stroke Neg Hx   . Lupus Mother     SOCIAL HISTORY:  Social History   Social History  . Marital Status: Single    Spouse Name: N/A  . Number of Children: N/A  . Years of Education: N/A   Occupational History  . Not on file.   Social History Main Topics  . Smoking status: Current Every Day Smoker -- 0.50 packs/day    Types: Cigarettes  . Smokeless tobacco: Former Neurosurgeon     Comment: patient needs to quit   . Alcohol Use: 1.2 oz/week    2 Cans of beer  per week     Comment:  two to three beers a week  . Drug Use: 7.00 per week    Special: Marijuana  . Sexual Activity:    Partners: Female   Other Topics Concern  . Not on file   Social History Narrative     PHYSICAL EXAM  There were no vitals filed for this visit.  There is no weight on file to calculate BMI.   General: The patient is well-developed and well-nourished and in no acute distress  Eyes:  Funduscopic exam shows normal optic discs and retinal vessels.  Neck: The neck is supple, no carotid bruits are noted.  The neck is nontender.  Cardiovascular: The heart has a regular rate and rhythm with a normal S1 and S2. There were no murmurs, gallops or rubs. Lungs are  clear to auscultation.  Skin: Extremities are without significant edema.  Musculoskeletal:  Back is nontender  Neurologic Exam  Mental status: The patient is alert and oriented x 3 at the time of the examination. The patient has apparent normal recent and remote memory, with an apparently normal attention span and concentration ability.   Speech is normal.  Cranial nerves: Extraocular movements are full. Pupils are equal, round, and reactive to light and accomodation.  Visual fields are full.  Facial symmetry is present. There is good facial sensation to soft touch bilaterally.Facial strength is normal.  Trapezius and sternocleidomastoid strength is normal. No dysarthria is noted.  The tongue is midline, and the patient has symmetric elevation of the soft palate. No obvious hearing deficits are noted.  Motor:  Muscle bulk is normal.   Tone is increased in legs. Strength is  5 / 5 in all 4 extremities.   Sensory: Sensory testing is intact to pinprick, soft touch and vibration sensation in all 4 extremities.  Coordination: Cerebellar testing reveals good finger-nose-finger and poor heel-to-shin bilaterally.  Gait and station: Station is normal.   Gait is wide and spastic. He cannot Tandem walk.   Romberg is  negative.   Reflexes: Deep tendon reflexes are increased with spread at the knees and clonus at ankles.    Plantar responses are extensor.    DIAGNOSTIC DATA (LABS, IMAGING, TESTING) - I reviewed patient records, labs, notes, testing and imaging myself where available.  Lab Results  Component Value Date   WBC 5.9 02/09/2014   HGB 15.1 02/09/2014   HCT 43.9 02/09/2014   MCV 94.2 02/09/2014   PLT 215 02/09/2014      Component Value Date/Time   NA 140 02/09/2014 1334   K 4.1 02/09/2014 1334   CL 103 02/09/2014 1334   CO2 23 02/09/2014 1334   GLUCOSE 124* 02/09/2014 1334   BUN 14 02/09/2014 1334   CREATININE 1.07 02/09/2014 1334   CALCIUM 9.6 02/09/2014 1334   PROT 6.8 05/08/2010 0055   ALBUMIN 4.0 05/08/2010 0055   AST 60* 05/08/2010 0055   ALT 55* 05/08/2010 0055   ALKPHOS 93 05/08/2010 0055   BILITOT 1.0 05/08/2010 0055   GFRNONAA 88* 02/09/2014 1334   GFRAA >90 02/09/2014 1334       ASSESSMENT AND PLAN  Multiple sclerosis (HCC) - Plan: Stratify JCV Antibody Test ([facility]), CBC with Differential/Platelet, Hepatic function panel  Bipolar disorder in full remission, most recent episode unspecified type (HCC)  Hemiplegia (HCC)  Numbness  Spastic gait  Urinary hesitancy   In summary, Bradley Brandt is a 35 year old man with multiple sclerosis who is currently stable on Tysabri therapy. He wishes to continue. We will check a JCV antibody to make sure that continuing will be safe. We will set him up for infusions at [facility]. He is currently incarcerated and will get transportation through the prison systems.    I'll add tamsulosin for worsening urinary hesitancy and baclofen for worsening spasticity.  He will return to see me in 3 or 4 months or sooner if there are new or worsening neurologic symptoms.   Richard A. Epimenio Foot, MD, PhD 12/16/2015, 2:32 PM Certified in Neurology, Clinical Neurophysiology, Sleep Medicine, Pain Medicine and Neuroimaging  [facility] [address] [place], [place] [address] [phone]

## 2015-12-17 LAB — HEPATIC FUNCTION PANEL
ALK PHOS: 81 IU/L (ref 39–117)
ALT: 13 IU/L (ref 0–44)
AST: 16 IU/L (ref 0–40)
Albumin: 4.3 g/dL (ref 3.5–5.5)
BILIRUBIN TOTAL: 0.3 mg/dL (ref 0.0–1.2)
Bilirubin, Direct: 0.08 mg/dL (ref 0.00–0.40)
Total Protein: 6.7 g/dL (ref 6.0–8.5)

## 2015-12-17 LAB — CBC WITH DIFFERENTIAL/PLATELET
BASOS: 1 %
Basophils Absolute: 0 10*3/uL (ref 0.0–0.2)
EOS (ABSOLUTE): 0.2 10*3/uL (ref 0.0–0.4)
EOS: 3 %
HEMATOCRIT: 44.5 % (ref 37.5–51.0)
HEMOGLOBIN: 15 g/dL (ref 12.6–17.7)
IMMATURE GRANULOCYTES: 0 %
Immature Grans (Abs): 0 10*3/uL (ref 0.0–0.1)
LYMPHS ABS: 4.6 10*3/uL — AB (ref 0.7–3.1)
Lymphs: 61 %
MCH: 31.3 pg (ref 26.6–33.0)
MCHC: 33.7 g/dL (ref 31.5–35.7)
MCV: 93 fL (ref 79–97)
MONOCYTES: 7 %
Monocytes Absolute: 0.5 10*3/uL (ref 0.1–0.9)
Neutrophils Absolute: 2.1 10*3/uL (ref 1.4–7.0)
Neutrophils: 28 %
Platelets: 186 10*3/uL (ref 150–379)
RBC: 4.8 x10E6/uL (ref 4.14–5.80)
RDW: 14.7 % (ref 12.3–15.4)
WBC: 7.4 10*3/uL (ref 3.4–10.8)

## 2015-12-17 NOTE — Telephone Encounter (Signed)
I have spoken with Rene Kocher at [facility] and advised I have not received switch prescriber form.  She will refax/fim

## 2015-12-17 NOTE — Telephone Encounter (Signed)
Marisa/[employer] [phone] called to inquire if we received "switch Prescriber form"

## 2015-12-20 ENCOUNTER — Encounter: Payer: Self-pay | Admitting: *Deleted

## 2015-12-24 ENCOUNTER — Encounter: Payer: Self-pay | Admitting: *Deleted

## 2015-12-30 ENCOUNTER — Encounter (HOSPITAL_COMMUNITY): Payer: Self-pay

## 2015-12-30 ENCOUNTER — Encounter (HOSPITAL_COMMUNITY)
Admission: RE | Admit: 2015-12-30 | Discharge: 2015-12-30 | Disposition: A | Discharge status: Home / Self Care | Source: Ambulatory Visit | Attending: Neurology | Admitting: Neurology

## 2015-12-30 DIAGNOSIS — G35 Multiple sclerosis: Secondary | ICD-10-CM | POA: Insufficient documentation

## 2015-12-30 MED ORDER — SODIUM CHLORIDE 0.9 % IV SOLN
INTRAVENOUS | Status: DC
  Administered 2015-12-30: 13:00:00 via INTRAVENOUS

## 2015-12-30 MED ORDER — SODIUM CHLORIDE 0.9 % IV SOLN
300.0000 mg | INTRAVENOUS | Status: DC
  Administered 2015-12-30: 300 mg via INTRAVENOUS
  Filled 2015-12-30: qty 15

## 2015-12-30 NOTE — Discharge Instructions (Signed)
Tysabri °Natalizumab injection °What is this medicine? °NATALIZUMAB (na ta LIZ you mab) is used to treat relapsing multiple sclerosis. This drug is not a cure. It is also used to treat Crohn's disease. °This medicine may be used for other purposes; ask your health care provider or pharmacist if you have questions. °What should I tell my health care provider before I take this medicine? °They need to know if you have any of these conditions: °-immune system problems °-progressive multifocal leukoencephalopathy (PML) °-an unusual or allergic reaction to natalizumab, other medicines, foods, dyes, or preservatives °-pregnant or trying to get pregnant °-breast-feeding °How should I use this medicine? °This medicine is for infusion into a vein. It is given by a health care professional in a hospital or clinic setting. °A special MedGuide will be given to you by the pharmacist with each prescription and refill. Be sure to read this information carefully each time. °Talk to your pediatrician regarding the use of this medicine in children. This medicine is not approved for use in children. °Overdosage: If you think you have taken too much of this medicine contact a poison control center or emergency room at once. °NOTE: This medicine is only for you. Do not share this medicine with others. °What if I miss a dose? °It is important not to miss your dose. Call your doctor or health care professional if you are unable to keep an appointment. °What may interact with this medicine? °-azathioprine °-cyclosporine °-interferon °-6-mercaptopurine °-methotrexate °-steroid medicines like prednisone or cortisone °-TNF-alpha inhibitors like adalimumab, etanercept, and infliximab °-vaccines °This list may not describe all possible interactions. Give your health care provider a list of all the medicines, herbs, non-prescription drugs, or dietary supplements you use. Also tell them if you smoke, drink alcohol, or use illegal drugs. Some  items may interact with your medicine. °What should I watch for while using this medicine? °Your condition will be monitored carefully while you are receiving this medicine. Visit your doctor for regular check ups. Tell your doctor or healthcare professional if your symptoms do not start to get better or if they get worse. °Stay away from people who are sick. Call your doctor or health care professional for advice if you get a fever, chills or sore throat, or other symptoms of a cold or flu. Do not treat yourself. °In some patients, this medicine may cause a serious brain infection that may cause death. If you have any problems seeing, thinking, speaking, walking, or standing, tell your doctor right away. If you cannot reach your doctor, get urgent medical care. °What side effects may I notice from receiving this medicine? °Side effects that you should report to your doctor or health care professional as soon as possible: °-allergic reactions like skin rash, itching or hives, swelling of the face, lips, or tongue °-breathing problems °-changes in vision °-chest pain °-dark urine °-depression, feelings of sadness °-dizziness °-general ill feeling or flu-like symptoms °-irregular, missed, or painful menstrual periods °-light-colored stools °-loss of appetite, nausea °-muscle weakness °-problems with balance, talking, or walking °-right upper belly pain °-unusually weak or tired °-yellowing of the eyes or skin °Side effects that usually do not require medical attention (report to your doctor or health care professional if they continue or are bothersome): °-aches, pains °-headache °-stomach upset °-tiredness °This list may not describe all possible side effects. Call your doctor for medical advice about side effects. You may report side effects to FDA at 1-800-FDA-1088. °Where should I keep my medicine? °This   drug is given in a hospital or clinic and will not be stored at home. °NOTE: This sheet is a summary. It may  not cover all possible information. If you have questions about this medicine, talk to your doctor, pharmacist, or health care provider. °  °© 2016, Elsevier/Gold Standard. (2008-07-25 13:33:21) ° °

## 2015-12-30 NOTE — Progress Notes (Signed)
Patient received Tysabri. Tolerated well.

## 2016-01-27 ENCOUNTER — Encounter (HOSPITAL_COMMUNITY)
Admission: RE | Admit: 2016-01-27 | Discharge: 2016-01-27 | Disposition: A | Discharge status: Home / Self Care | Source: Ambulatory Visit | Attending: Neurology | Admitting: Neurology

## 2016-01-27 ENCOUNTER — Encounter (HOSPITAL_COMMUNITY): Payer: Self-pay

## 2016-01-27 DIAGNOSIS — G35 Multiple sclerosis: Secondary | ICD-10-CM | POA: Diagnosis not present

## 2016-01-27 MED ORDER — SODIUM CHLORIDE 0.9 % IV SOLN
300.0000 mg | INTRAVENOUS | Status: DC
  Administered 2016-01-27: 300 mg via INTRAVENOUS
  Filled 2016-01-27: qty 15

## 2016-01-27 MED ORDER — SODIUM CHLORIDE 0.9 % IV SOLN
INTRAVENOUS | Status: DC
  Administered 2016-01-27: 13:00:00 via INTRAVENOUS

## 2016-01-27 NOTE — Discharge Instructions (Signed)

## 2016-02-09 ENCOUNTER — Encounter: Payer: Self-pay | Admitting: *Deleted

## 2016-02-23 ENCOUNTER — Telehealth: Payer: Self-pay | Admitting: Neurology

## 2016-02-23 NOTE — Telephone Encounter (Signed)
Melanie/ [place] called about authorization for pt to have infusion at [facility]. Please call [phone].

## 2016-02-23 NOTE — Telephone Encounter (Signed)
LMOM for Bradley Brandt to call/fim

## 2016-02-24 ENCOUNTER — Encounter (HOSPITAL_COMMUNITY): Payer: Self-pay

## 2016-02-24 ENCOUNTER — Encounter (HOSPITAL_COMMUNITY)
Admission: RE | Admit: 2016-02-24 | Discharge: 2016-02-24 | Disposition: A | Discharge status: Home / Self Care | Source: Ambulatory Visit | Attending: Neurology | Admitting: Neurology

## 2016-02-24 DIAGNOSIS — G35 Multiple sclerosis: Secondary | ICD-10-CM | POA: Diagnosis not present

## 2016-02-24 MED ORDER — SODIUM CHLORIDE 0.9 % IV SOLN
300.0000 mg | INTRAVENOUS | Status: DC
  Administered 2016-02-24: 300 mg via INTRAVENOUS
  Filled 2016-02-24: qty 15

## 2016-02-24 MED ORDER — SODIUM CHLORIDE 0.9 % IV SOLN
INTRAVENOUS | Status: DC
  Administered 2016-02-24: 12:00:00 via INTRAVENOUS

## 2016-02-24 NOTE — Discharge Instructions (Signed)

## 2016-03-23 ENCOUNTER — Encounter (HOSPITAL_COMMUNITY): Payer: Self-pay

## 2016-03-23 ENCOUNTER — Encounter (HOSPITAL_COMMUNITY)
Admission: RE | Admit: 2016-03-23 | Discharge: 2016-03-23 | Disposition: A | Discharge status: Home / Self Care | Source: Ambulatory Visit | Attending: Neurology | Admitting: Neurology

## 2016-03-23 DIAGNOSIS — G35 Multiple sclerosis: Secondary | ICD-10-CM | POA: Diagnosis present

## 2016-03-23 MED ORDER — SODIUM CHLORIDE 0.9 % IV SOLN
300.0000 mg | INTRAVENOUS | Status: AC
  Administered 2016-03-23: 300 mg via INTRAVENOUS
  Filled 2016-03-23: qty 15

## 2016-03-23 MED ORDER — SODIUM CHLORIDE 0.9 % IV SOLN
INTRAVENOUS | Status: AC
  Administered 2016-03-23: 13:00:00 via INTRAVENOUS

## 2016-03-23 NOTE — Discharge Instructions (Signed)

## 2016-03-23 NOTE — Progress Notes (Signed)
Patient had uneventful Tysabri infusion. Tolerated well.

## 2016-04-19 ENCOUNTER — Ambulatory Visit: Payer: Medicaid Other | Admitting: Neurology

## 2016-04-20 ENCOUNTER — Encounter (HOSPITAL_COMMUNITY): Admission: RE | Admit: 2016-04-20 | Source: Ambulatory Visit

## 2016-04-21 ENCOUNTER — Encounter (HOSPITAL_COMMUNITY)
Admission: RE | Admit: 2016-04-21 | Discharge: 2016-04-21 | Disposition: A | Discharge status: Home / Self Care | Source: Ambulatory Visit | Attending: Family Medicine | Admitting: Family Medicine

## 2016-04-21 ENCOUNTER — Encounter (HOSPITAL_COMMUNITY): Payer: Self-pay

## 2016-04-21 DIAGNOSIS — G35 Multiple sclerosis: Secondary | ICD-10-CM | POA: Insufficient documentation

## 2016-04-21 MED ORDER — SODIUM CHLORIDE 0.9 % IV SOLN
INTRAVENOUS | Status: DC
  Administered 2016-04-21: 09:00:00 via INTRAVENOUS

## 2016-04-21 MED ORDER — SODIUM CHLORIDE 0.9 % IV SOLN
300.0000 mg | INTRAVENOUS | Status: DC
  Administered 2016-04-21: 300 mg via INTRAVENOUS
  Filled 2016-04-21: qty 15

## 2016-04-21 NOTE — Discharge Instructions (Signed)

## 2016-04-25 ENCOUNTER — Encounter: Payer: Self-pay | Admitting: Neurology

## 2016-05-18 ENCOUNTER — Encounter (HOSPITAL_COMMUNITY)

## 2016-05-22 ENCOUNTER — Encounter (HOSPITAL_COMMUNITY)
Admission: RE | Admit: 2016-05-22 | Discharge: 2016-05-22 | Disposition: A | Discharge status: Home / Self Care | Source: Ambulatory Visit | Attending: Neurology | Admitting: Neurology

## 2016-05-22 ENCOUNTER — Encounter (HOSPITAL_COMMUNITY): Payer: Self-pay

## 2016-05-22 DIAGNOSIS — G35 Multiple sclerosis: Secondary | ICD-10-CM | POA: Insufficient documentation

## 2016-05-22 HISTORY — DX: Myoneural disorder, unspecified: G70.9

## 2016-05-22 MED ORDER — SODIUM CHLORIDE 0.9 % IV SOLN
300.0000 mg | INTRAVENOUS | Status: DC
  Administered 2016-05-22: 300 mg via INTRAVENOUS
  Filled 2016-05-22: qty 15

## 2016-05-22 MED ORDER — SODIUM CHLORIDE 0.9 % IV SOLN
INTRAVENOUS | Status: DC
  Administered 2016-05-22: 10:00:00 via INTRAVENOUS

## 2016-05-22 NOTE — Progress Notes (Signed)
[facility] to Nurse Sharma Covert at [facility] facility, Pt tolerated infusion well.  Educational information sent back to her and advised of second occurrence of tardiness for appointment.  Officers and Pt 40 minutes late arriving this visit and reported 1 hour late on last visit.  Advised Nurse of our tardy policy and would not be able to guarantee infusion if > 15 minutes late for future appointments.  Also instructed to have officers come thru ER and check in with security prior to checking in with admitting.

## 2016-05-22 NOTE — Discharge Instructions (Signed)
Natalizumab injection / infusion  What is this medicine? NATALIZUMAB (na ta LIZ you mab) is used to treat relapsing multiple sclerosis. This drug is not a cure. It is also used to treat Crohn's disease. This medicine may be used for other purposes; ask your health care provider or pharmacist if you have questions. COMMON BRAND NAME(S): Tysabri What should I tell my health care provider before I take this medicine? They need to know if you have any of these conditions: -immune system problems -progressive multifocal leukoencephalopathy (PML) -an unusual or allergic reaction to natalizumab, other medicines, foods, dyes, or preservatives -pregnant or trying to get pregnant -breast-feeding How should I use this medicine? This medicine is for infusion into a vein. It is given by a health care professional in a hospital or clinic setting. A special MedGuide will be given to you by the pharmacist with each prescription and refill. Be sure to read this information carefully each time. Talk to your pediatrician regarding the use of this medicine in children. This medicine is not approved for use in children. Overdosage: If you think you have taken too much of this medicine contact a poison control center or emergency room at once. NOTE: This medicine is only for you. Do not share this medicine with others. What if I miss a dose? It is important not to miss your dose. Call your doctor or health care professional if you are unable to keep an appointment. What may interact with this medicine? -azathioprine -cyclosporine -interferon -6-mercaptopurine -methotrexate -steroid medicines like prednisone or cortisone -TNF-alpha inhibitors like adalimumab, etanercept, and infliximab -vaccines This list may not describe all possible interactions. Give your health care provider a list of all the medicines, herbs, non-prescription drugs, or dietary supplements you use. Also tell them if you smoke, drink  alcohol, or use illegal drugs. Some items may interact with your medicine. What should I watch for while using this medicine? Your condition will be monitored carefully while you are receiving this medicine. Visit your doctor for regular check ups. Tell your doctor or healthcare professional if your symptoms do not start to get better or if they get worse. Stay away from people who are sick. Call your doctor or health care professional for advice if you get a fever, chills or sore throat, or other symptoms of a cold or flu. Do not treat yourself. In some patients, this medicine may cause a serious brain infection that may cause death. If you have any problems seeing, thinking, speaking, walking, or standing, tell your doctor right away. If you cannot reach your doctor, get urgent medical care. What side effects may I notice from receiving this medicine? Side effects that you should report to your doctor or health care professional as soon as possible: -allergic reactions like skin rash, itching or hives, swelling of the face, lips, or tongue -breathing problems -changes in vision -chest pain -dark urine -depression, feelings of sadness -dizziness -general ill feeling or flu-like symptoms -irregular, missed, or painful menstrual periods -light-colored stools -loss of appetite, nausea -muscle weakness -problems with balance, talking, or walking -right upper belly pain -unusually weak or tired -yellowing of the eyes or skin Side effects that usually do not require medical attention (report to your doctor or health care professional if they continue or are bothersome): -aches, pains -headache -stomach upset -tiredness This list may not describe all possible side effects. Call your doctor for medical advice about side effects. You may report side effects to FDA at 1-800-FDA-1088. Where  should I keep my medicine? This drug is given in a hospital or clinic and will not be stored at  home. NOTE: This sheet is a summary. It may not cover all possible information. If you have questions about this medicine, talk to your doctor, pharmacist, or health care provider.  2017 Elsevier/Gold Standard (2008-07-25 13:33:21)

## 2016-06-16 ENCOUNTER — Encounter (HOSPITAL_COMMUNITY)

## 2016-06-21 ENCOUNTER — Encounter (HOSPITAL_COMMUNITY)
Admission: RE | Admit: 2016-06-21 | Discharge: 2016-06-21 | Disposition: A | Discharge status: Home / Self Care | Source: Ambulatory Visit | Attending: Neurology | Admitting: Neurology

## 2016-06-21 ENCOUNTER — Encounter (HOSPITAL_COMMUNITY): Payer: Self-pay

## 2016-06-21 DIAGNOSIS — G35 Multiple sclerosis: Secondary | ICD-10-CM | POA: Diagnosis not present

## 2016-06-21 MED ORDER — SODIUM CHLORIDE 0.9 % IV SOLN
INTRAVENOUS | Status: DC
  Administered 2016-06-21: 11:00:00 via INTRAVENOUS

## 2016-06-21 MED ORDER — SODIUM CHLORIDE 0.9 % IV SOLN
300.0000 mg | INTRAVENOUS | Status: DC
  Administered 2016-06-21: 300 mg via INTRAVENOUS
  Filled 2016-06-21: qty 15

## 2016-07-21 ENCOUNTER — Encounter (HOSPITAL_COMMUNITY)
Admission: RE | Admit: 2016-07-21 | Discharge: 2016-07-21 | Disposition: A | Discharge status: Home / Self Care | Source: Ambulatory Visit | Attending: Neurology | Admitting: Neurology

## 2016-07-21 DIAGNOSIS — G35 Multiple sclerosis: Secondary | ICD-10-CM | POA: Insufficient documentation

## 2016-08-01 ENCOUNTER — Telehealth: Payer: Self-pay | Admitting: *Deleted

## 2016-08-01 ENCOUNTER — Encounter: Payer: Self-pay | Admitting: *Deleted

## 2016-08-01 NOTE — Telephone Encounter (Signed)
Pt. needs appt. to see [facility].  He was last seen in June 2017, is on Tysabri for MS.  He missed his last Tysabri infusion at [facility].  He is incarcerated, but no longer in [place]. I have spoken with [place] at [facility] does not have contact info for pt. either, confirmed he is no longer in [place].  Sts. they have not been contacted to r/s Tysabri/fim

## 2016-08-10 NOTE — Telephone Encounter (Signed)
I have spoken with Margaretha Glassing, nsg. supervisor with [facility] (phone# [phone], fax# [phone]).  She also faxed me a med rec. release signed by pt.  She is requesting pt. records to determine tx. plan.  Pt. has only been here once, on 12-16-15.  Christina sts. she has this note.  She is requesting notes from [facility] infusions at [facility]. has been told by infusion employee that they are not able to release notes to her.  I have explained that I am not able to release hospital notes to her, only our clinic notes. She verbalized understanding of same.  I have explained that pt. has MS, is receiving Tysabri every 30 days at the hospital, was due for f/u with [facility] in fall 2017, but we have not been able to reach him, obviously due to incarceration. I have explained that pt. will not be able to continue receiving IV Tysabri if he is not seen back for f/u with [facility] and has labwork.  I have offered to make an appt. for pt. now.  Trula Ore declined, stating she has to speak with prison doc and get approval first, then will call back/fim

## 2016-08-14 NOTE — Telephone Encounter (Signed)
Bradley Brandt/Medical Records @ [facility] [phone] calling to setup appt. She said they can do labs there is you prefer. Please call

## 2016-08-14 NOTE — Telephone Encounter (Signed)
I have spoken with Bradley Brandt and given pt. appt. 08-23-16./fim

## 2016-08-23 ENCOUNTER — Ambulatory Visit (INDEPENDENT_AMBULATORY_CARE_PROVIDER_SITE_OTHER): Payer: Medicaid Other | Admitting: Neurology

## 2016-08-23 ENCOUNTER — Encounter: Payer: Self-pay | Admitting: Neurology

## 2016-08-23 VITALS — BP 138/78 | HR 68 | Resp 18 | Ht 71.0 in | Wt 236.0 lb

## 2016-08-23 DIAGNOSIS — G8191 Hemiplegia, unspecified affecting right dominant side: Secondary | ICD-10-CM | POA: Diagnosis not present

## 2016-08-23 DIAGNOSIS — G35 Multiple sclerosis: Secondary | ICD-10-CM | POA: Diagnosis not present

## 2016-08-23 DIAGNOSIS — R261 Paralytic gait: Secondary | ICD-10-CM

## 2016-08-23 DIAGNOSIS — G35D Multiple sclerosis, unspecified: Secondary | ICD-10-CM

## 2016-08-23 DIAGNOSIS — R200 Anesthesia of skin: Secondary | ICD-10-CM

## 2016-08-23 DIAGNOSIS — R3911 Hesitancy of micturition: Secondary | ICD-10-CM | POA: Diagnosis not present

## 2016-08-23 DIAGNOSIS — F3170 Bipolar disorder, currently in remission, most recent episode unspecified: Secondary | ICD-10-CM

## 2016-08-23 MED ORDER — BACLOFEN 20 MG PO TABS: 20.0000 mg | ORAL_TABLET | Freq: Three times a day (TID) | ORAL | 11 refills | Status: DC

## 2016-08-23 MED ORDER — GABAPENTIN 600 MG PO TABS: 600.0000 mg | ORAL_TABLET | Freq: Three times a day (TID) | ORAL | 11 refills | Status: DC

## 2016-08-23 NOTE — Patient Instructions (Signed)
1.   Gabapentin 600 mg 3 times a day. 2.   Increase baclofen to 20 mg 3 times a day 3.   Walker with wheels, hand brakes and seat (Rollator Type) 4.   Physical therapy weekly for gait disturbance associated with his multiple sclerosis. 5.   Follow-up in about 6 months  It is essential that you get Tysabri every 4 weeks.

## 2016-08-23 NOTE — Progress Notes (Signed)
[name]  PATIENT: Bradley Brandt DOB: 10-04-1980  REFERRING DOCTOR OR PCP:  Thomes Dinning SOURCE: patient, records form Dr. Gaynelle Adu, MRI reports and images on PACS  _________________________________   HISTORICAL  CHIEF COMPLAINT:  Chief Complaint  Patient presents with  . Multiple Sclerosis    Sts. he has tolerated Tysabri well.  Last infusion was 06-21-16.  He is currently incarcerated at [facility] in [place].  [facility] is closer to that facility, so  he would like to discuss a referral to that facility.  Sts. Tylenol does not help lbp that radiates down both legs, but Gabapentin has helped in the past--would like rx. for this/fim    HISTORY OF PRESENT ILLNESS:  I had the pleasure seeing you patient, Bradley Brandt, at [facility] for neurologic consultation regarding his multiple sclerosis.   He is currently on Tysabri. He tolerates it well and feels that his MS has done better on Tysabri than any other medication. He also notes that it helps his fatigue. He reports being JCV antibody negative  Gait/strength/sensation: He has difficulty with his gait and walk without a cane. He notes spasticity in both legs. He feels that there is chronic mild right leg weakness but that the left leg weakness got worse more recently.   He notes some tingling in the feet but does not feel that it is painful.  Vision: He feels vision is doing ok with newer glasses. He has not noted changes in color vision. He notes no diplopia. .   Bladder: He notes increased urinary frequency but no incontinence.  Hesitancy is better on Flomax.     Fatigue/sleep: He notes fatigue and sleepiness in the afternoons that improved on Tysabri.  Sleeps well at night  Mood/cognition: He denies any depression or anxiety now but had some in the past.   He has been diagnosed with bipolar disease and is on a psychiatric medicine but does not know the name..  Notes some decreased focus and attention but notes no major cognitive problems.  MS history: Chadron was diagnosed around 2001 when he presented with right-sided numbness and clumsiness and weakness.   Initially, he was on Avonex and then Rebif but had relapses on both of them.  He could not tolerate Tecfidera.   He was started on Tysabri about 1 year ago and his last infusion was at [facility] at 11/08/2015.  He was seen Dr. Gaynelle Adu [facility] and those records were reviewed. He did receive a course of Solu-Medrol March 2015 and another course of Solu-Medrol January 2016 when he presented with left leg weakness and numbness that was new    An MRI of the brain 08/28/2013 personally reviewed shows old extensive white matter changes in both hemispheres with multiple sclerosis. There were new areas of restricted diffusion without definite enhancement that could be consistent with ongoing demyelination when compared to the MRI from 05/13/2013.     REVIEW OF SYSTEMS: Constitutional: No fevers, chills, sweats, or change in appetite.   Notes fatigue Eyes: No visual changes, double vision, eye pain Ear, nose and throat: No hearing loss, ear pain, nasal congestion, sore throat Cardiovascular: No chest pain, palpitations Respiratory: No shortness of breath at rest or with exertion.   No wheezes GastrointestinaI: No nausea, vomiting, diarrhea, abdominal pain, fecal incontinence Genitourinary: He has hesitancy and frequency.   No UTI's. Musculoskeletal: No neck pain, back pain Integumentary: No rash, pruritus, skin lesions Neurological: as above  Psychiatric: No depression at this time.  No anxiety Endocrine: No palpitations, diaphoresis, change in appetite, change in weigh or increased thirst Hematologic/Lymphatic: No anemia, purpura, petechiae. Allergic/Immunologic: No itchy/runny eyes, nasal congestion, recent allergic reactions, rashes  ALLERGIES: Allergies  Allergen  Reactions  . Lithium Rash    HOME MEDICATIONS:  Current Outpatient Prescriptions:  .  acetaminophen (TYLENOL) 500 MG tablet, Take 500 mg by mouth 2 (two) times daily., Disp: , Rfl:  .  baclofen (LIORESAL) 20 MG tablet, Take 1 tablet (20 mg total) by mouth 3 (three) times daily., Disp: 90 tablet, Rfl: 11 .  mirtazapine (REMERON) 15 MG tablet, Take 15 mg by mouth at bedtime., Disp: , Rfl:  .  natalizumab (TYSABRI) 300 MG/15ML injection, Inject 300 mg into the vein every 30 (thirty) days., Disp: , Rfl:  .  ziprasidone (GEODON) 40 MG capsule, Take 40 mg by mouth 2 (two) times daily with a meal., Disp: , Rfl:  .  cyclobenzaprine (FLEXERIL) 10 MG tablet, Take 10 mg by mouth 3 (three) times daily as needed for muscle spasms., Disp: , Rfl:  .  FLUoxetine (PROZAC) 20 MG capsule, Take 20 mg by mouth daily. Per patient - not sure of dose, Disp: , Rfl:  .  gabapentin (NEURONTIN) 600 MG tablet, Take 1 tablet (600 mg total) by mouth 3 (three) times daily., Disp: 90 tablet, Rfl: 11 .  OLANZapine (ZYPREXA) 15 MG tablet, Take 15 mg by mouth at bedtime., Disp: , Rfl:  .  tamsulosin (FLOMAX) 0.4 MG CAPS capsule, Take 0.4 mg by mouth., Disp: , Rfl:   PAST MEDICAL HISTORY: Past Medical History:  Diagnosis Date  . Bipolar 1 disorder (HCC)   . Bipolar disorder, unspecified 01/03/2013  . MS (multiple sclerosis) (HCC) dx 2006   relapsing-remitting right sided weakness  . Neuromuscular disorder (HCC)    MS    PAST SURGICAL HISTORY: Past Surgical History:  Procedure Laterality Date  . TUMOR REMOVAL  1983   abdomen benign    FAMILY HISTORY: Family History  Problem Relation Age of Onset  . Adopted: Yes  . Lupus Mother   . Ataxia Neg Hx   . Chorea Neg Hx   . Dementia Neg Hx   . Mental retardation Neg Hx   . Migraines Neg Hx   . Multiple sclerosis Neg Hx   . Neurofibromatosis Neg Hx   . Neuropathy Neg Hx   . Parkinsonism Neg Hx   . Seizures Neg Hx   . Stroke Neg Hx     SOCIAL HISTORY:  Social  History   Social History  . Marital status: Single    Spouse name: N/A  . Number of children: N/A  . Years of education: N/A   Occupational History  . Not on file.   Social History Main Topics  . Smoking status: Former Smoker    Packs/day: 0.50    Types: Cigarettes  . Smokeless tobacco: Former Neurosurgeon     Comment: patient needs to quit   . Alcohol use 1.2 oz/week    2 Cans of beer per week     Comment:  two to three beers a week  . Drug use: Yes    Frequency: 7.0 times per week    Types: Marijuana  . Sexual activity: Yes    Partners: Female   Other Topics Concern  . Not on file   Social History Narrative  . No narrative on file     PHYSICAL EXAM  Vitals:  08/23/16 1318  BP: 138/78  Pulse: 68  Resp: 18  Weight: 236 lb (107 kg)  Height: 5\' 11"  (1.803 m)    Body mass index is 32.92 kg/m.   General: The patient is well-developed and well-nourished and in no acute distress   Neurologic Exam  Mental status: The patient is alert and oriented x 3 at the time of the examination. The patient has apparent normal recent and remote memory, with an apparently normal attention span and concentration ability.   Speech is normal.  Cranial nerves: Extraocular movements are full.  There is good facial sensation to soft touch bilaterally.Facial strength is normal.  Trapezius and sternocleidomastoid strength is normal. No dysarthria is noted.  The tongue is midline, and the patient has symmetric elevation of the soft palate. No obvious hearing deficits are noted.  Motor:  Muscle bulk is normal.   Tone is increased in legs. Strength is  5 / 5 in arms and 4-/5 in left leg and 3 to 4-/5 in right leg.   Sensory: Reduce touch and vibration in right arm and leg.  Coordination: Cerebellar testing reveals good finger-nose-finger and poor heel-to-shin bilaterally.  Gait and station: Station is normal.   Gait is wide and spastic and needs support.   Romberg is negative.   Reflexes:  Deep tendon reflexes are increased with spread at the knees and clonus at ankles.        DIAGNOSTIC DATA (LABS, IMAGING, TESTING) - I reviewed patient records, labs, notes, testing and imaging myself where available.  Lab Results  Component Value Date   WBC 7.4 12/16/2015   HGB 15.1 02/09/2014   HCT 44.5 12/16/2015   MCV 93 12/16/2015   PLT 186 12/16/2015      Component Value Date/Time   NA 140 02/09/2014 1334   K 4.1 02/09/2014 1334   CL 103 02/09/2014 1334   CO2 23 02/09/2014 1334   GLUCOSE 124 (H) 02/09/2014 1334   BUN 14 02/09/2014 1334   CREATININE 1.07 02/09/2014 1334   CALCIUM 9.6 02/09/2014 1334   PROT 6.7 12/16/2015 1341   ALBUMIN 4.3 12/16/2015 1341   AST 16 12/16/2015 1341   ALT 13 12/16/2015 1341   ALKPHOS 81 12/16/2015 1341   BILITOT 0.3 12/16/2015 1341   GFRNONAA 88 (L) 02/09/2014 1334   GFRAA >90 02/09/2014 1334       ASSESSMENT AND PLAN  Multiple sclerosis flare - Plan: Stratify JCV Antibody Test ([facility]), CBC with Differential/Platelet  Right hemiparesis (HCC)  Bipolar disorder in full remission, most recent episode unspecified type (HCC)  Spastic gait  Urinary hesitancy  Numbness   1.   Tysabri monthly. He will need to get a JCV antibody test today. 2.   Gabapentin 600 mg by mouth 3 times a day and increase baclofen to 10 mg by mouth 3 times a day. 3.   I wrote for a walker and also for physical therapy weekly while he is in prison. 4.   He will return to see me in 6 months or sooner if there are new or worsening neurologic symptoms.   Sadia Belfiore A. Epimenio Foot, MD, PhD 08/23/2016, 2:02 PM Certified in Neurology, Clinical Neurophysiology, Sleep Medicine, Pain Medicine and Neuroimaging  [facility] [address] [place], [place] [address] [phone]

## 2016-08-24 LAB — CBC WITH DIFFERENTIAL/PLATELET
Basophils Absolute: 0 10*3/uL (ref 0.0–0.2)
Basos: 0 %
EOS (ABSOLUTE): 0.2 10*3/uL (ref 0.0–0.4)
Eos: 4 %
HEMATOCRIT: 44.4 % (ref 37.5–51.0)
HEMOGLOBIN: 15.2 g/dL (ref 13.0–17.7)
IMMATURE GRANULOCYTES: 0 %
Immature Grans (Abs): 0 10*3/uL (ref 0.0–0.1)
LYMPHS: 49 %
Lymphocytes Absolute: 2.1 10*3/uL (ref 0.7–3.1)
MCH: 30.8 pg (ref 26.6–33.0)
MCHC: 34.2 g/dL (ref 31.5–35.7)
MCV: 90 fL (ref 79–97)
MONOCYTES: 7 %
Monocytes Absolute: 0.3 10*3/uL (ref 0.1–0.9)
NEUTROS PCT: 40 %
Neutrophils Absolute: 1.7 10*3/uL (ref 1.4–7.0)
Platelets: 200 10*3/uL (ref 150–379)
RBC: 4.93 x10E6/uL (ref 4.14–5.80)
RDW: 14.8 % (ref 12.3–15.4)
WBC: 4.3 10*3/uL (ref 3.4–10.8)

## 2016-08-29 ENCOUNTER — Encounter: Payer: Self-pay | Admitting: *Deleted

## 2016-08-31 ENCOUNTER — Ambulatory Visit (HOSPITAL_COMMUNITY): Admission: RE | Admit: 2016-08-31 | Source: Ambulatory Visit

## 2016-09-04 ENCOUNTER — Telehealth: Payer: Self-pay | Admitting: Neurology

## 2016-09-04 NOTE — Telephone Encounter (Signed)
I have spoken with Bradley Brandt this morning.  Pt. missed his Tysabri infusion appt. last week.  New appt. made at [facility] short stay, for 09-18-16, arrival time of 1215.  Pt. has a f/u appt. with [facility] on 03-01-17 at 1430.  These appt. details, along with last ov note, faxed to [facility] at [phone]

## 2016-09-04 NOTE — Telephone Encounter (Signed)
[facility] [phone] called wanting to know about the pt's infusion. Said she got the notes from last OV but it does not advise about monthly tysabri infusion.

## 2016-09-05 ENCOUNTER — Telehealth: Payer: Self-pay | Admitting: Neurology

## 2016-09-05 MED ORDER — NATALIZUMAB 300 MG/15ML IV CONC: 300.0000 mg | INTRAVENOUS | 5 refills | Status: DC

## 2016-09-05 NOTE — Telephone Encounter (Signed)
Sarah Peak with [facility] called in reference to patients Tysabri infusions.  Per Maralyn Sago the hospital at [facility] has been approved to give Tysabri infusions now.  She would like to see if patient is able to come to their location to have the infusion, and if so will need a prescription.  Please call

## 2016-09-05 NOTE — Addendum Note (Signed)
Addended by: Candis Schatz I on: 09/05/2016 04:49 PM   Modules accepted: Orders

## 2016-09-05 NOTE — Telephone Encounter (Signed)
I have spoken with Pennie Rushing, pharmacist at [facility].   Pt. will still need to see pt. every 6 months and she is agreeable with this.  Ty rx. faxed to her at [phone]

## 2016-09-13 NOTE — Telephone Encounter (Signed)
LMOM (identified vm) for Bradley Brandt, that JCV ab checked in March was negative at 0.12.  Pt. can proceed with Tysabri infusions/fim

## 2016-09-13 NOTE — Telephone Encounter (Signed)
Harriett Sine in the pharmacy re: the patient who is an inmate at [facility]  Is asking for a call re: results of last test in order to move forward with infusion for pt. She is asking to be called at [phone]

## 2016-09-18 ENCOUNTER — Encounter (HOSPITAL_COMMUNITY): Admission: RE | Admit: 2016-09-18 | Source: Ambulatory Visit

## 2016-09-19 DIAGNOSIS — E7800 Pure hypercholesterolemia, unspecified: Secondary | ICD-10-CM | POA: Insufficient documentation

## 2016-09-19 DIAGNOSIS — E559 Vitamin D deficiency, unspecified: Secondary | ICD-10-CM | POA: Insufficient documentation

## 2016-09-21 ENCOUNTER — Encounter: Payer: Self-pay | Admitting: *Deleted

## 2016-10-17 ENCOUNTER — Encounter (HOSPITAL_COMMUNITY)

## 2016-11-14 ENCOUNTER — Encounter (HOSPITAL_COMMUNITY): Admission: RE | Admit: 2016-11-14 | Source: Ambulatory Visit

## 2016-12-12 ENCOUNTER — Encounter (HOSPITAL_COMMUNITY)

## 2017-01-09 ENCOUNTER — Encounter (HOSPITAL_COMMUNITY)

## 2017-02-06 ENCOUNTER — Encounter (HOSPITAL_COMMUNITY)

## 2017-02-07 ENCOUNTER — Encounter: Payer: Self-pay | Admitting: *Deleted

## 2017-03-01 ENCOUNTER — Ambulatory Visit: Payer: Medicaid Other | Admitting: Neurology

## 2017-03-05 ENCOUNTER — Encounter: Payer: Self-pay | Admitting: Neurology

## 2017-05-31 LAB — HM DIABETES EYE EXAM

## 2017-06-13 ENCOUNTER — Telehealth: Payer: Self-pay | Admitting: Neurology

## 2017-06-13 NOTE — Telephone Encounter (Addendum)
Spoke to patient - he has been incarcerated in [facility] in [place], [place].  He was released on 05/21/17 and needs to see Dr. Epimenio FootSater to continue his Tysabri infusions.  He was getting Tysabri monthly while in prison.  States his last infusion was in November 2018 but he does not remember the date.  He has a pending follow up with Dr. Epimenio FootSater on 07/03/17 to re-establish care.

## 2017-06-13 NOTE — Telephone Encounter (Signed)
Pt said he was told to call about setting up tysabri infusions but he does not know who advised him to call. Pt is aware Dr Epimenio FootSater and RN are out of the office this week, this can wait until they return.

## 2017-06-14 NOTE — Telephone Encounter (Signed)
Pt said he was advised the appt needs to be scheduled by the clinic. Please call to advise at [phone]

## 2017-06-14 NOTE — Telephone Encounter (Signed)
Spoke to patient - he attempted to schedule his Tysabri infusion with [facility].  He is aware that he will need to see Dr. Epimenio Foot first and will keep his pending appointment.

## 2017-06-29 ENCOUNTER — Encounter: Payer: Self-pay | Admitting: *Deleted

## 2017-07-03 ENCOUNTER — Ambulatory Visit: Payer: Medicaid Other | Admitting: Neurology

## 2017-07-03 ENCOUNTER — Encounter: Payer: Self-pay | Admitting: Neurology

## 2017-07-03 ENCOUNTER — Other Ambulatory Visit: Payer: Self-pay

## 2017-07-03 VITALS — BP 118/68 | HR 62 | Resp 18 | Ht 71.0 in | Wt 243.5 lb

## 2017-07-03 DIAGNOSIS — R200 Anesthesia of skin: Secondary | ICD-10-CM

## 2017-07-03 DIAGNOSIS — F3170 Bipolar disorder, currently in remission, most recent episode unspecified: Secondary | ICD-10-CM | POA: Diagnosis not present

## 2017-07-03 DIAGNOSIS — R269 Unspecified abnormalities of gait and mobility: Secondary | ICD-10-CM

## 2017-07-03 DIAGNOSIS — R3911 Hesitancy of micturition: Secondary | ICD-10-CM

## 2017-07-03 DIAGNOSIS — G35 Multiple sclerosis: Secondary | ICD-10-CM

## 2017-07-03 DIAGNOSIS — R261 Paralytic gait: Secondary | ICD-10-CM | POA: Diagnosis not present

## 2017-07-03 MED ORDER — GABAPENTIN 600 MG PO TABS: 600.0000 mg | ORAL_TABLET | Freq: Three times a day (TID) | ORAL | 11 refills | Status: DC

## 2017-07-03 MED ORDER — TAMSULOSIN HCL 0.4 MG PO CAPS: 0.8000 mg | ORAL_CAPSULE | Freq: Every day | ORAL | 11 refills | Status: DC

## 2017-07-03 MED ORDER — FLUOXETINE HCL 20 MG PO CAPS: 20.0000 mg | ORAL_CAPSULE | Freq: Every day | ORAL | 11 refills | Status: DC

## 2017-07-03 MED ORDER — QUETIAPINE FUMARATE 25 MG PO TABS: 25.0000 mg | ORAL_TABLET | Freq: Every day | ORAL | 5 refills | Status: DC

## 2017-07-03 MED ORDER — BACLOFEN 20 MG PO TABS: 20.0000 mg | ORAL_TABLET | Freq: Three times a day (TID) | ORAL | 11 refills | Status: DC

## 2017-07-03 MED ORDER — GABAPENTIN 600 MG PO TABS
600.0000 mg | ORAL_TABLET | Freq: Three times a day (TID) | ORAL | 11 refills | Status: DC
Start: 2017-07-03 — End: 2017-12-31

## 2017-07-03 NOTE — Progress Notes (Signed)
[name]  PATIENT: Bradley Brandt DOB: February 02, 1981  REFERRING DOCTOR OR PCP:  Thomes Dinning SOURCE: patient, records form Dr. Gaynelle Adu, MRI reports and images on PACS  _________________________________   HISTORICAL  CHIEF COMPLAINT:  Chief Complaint  Patient presents with  . Multiple Sclerosis    Last Tysabri infusion was in November 2018, in prison.  Has been released and needs to resume Ty infusions.  Has been approved thru [facility] to have infusions at [facility].  JCV ab last checked 08/23/16 and was negative at 0.12.  Having more trouble with bilat leg weakness, pins and needles sensation in both legs.  Using a cane today.  Needs r/f of Flomax/fim    HISTORY OF PRESENT ILLNESS:  Bradley Brandt is a 37 yo man with multiple sclerosis.     Update 07/03/2017: He is on Tsabri and he tolerated it well.   He has not had any exacerbation recently.   He notes reduced gait and can walk abput 50-100 feet without stopping.    He feels both legs are weak and left = right.    He notes spasticity in both legs.   He notes a pins/needles tingling dysesthesia in both legs.   His arms are strong.    He has trouble with urinary hesitancy and often needs to push to empty.    He has not had any UTI.    Flomax has helped.      Vision is doing well.  He notes increased fatigue but also has trouble falling asleep.        He has finished his jail term and is back home.    From 08/23/2016: He is currently on Tysabri. He tolerates it well and feels that his MS has done better on Tysabri than any other medication. He also notes that it helps his fatigue. He reports being JCV antibody negative  Gait/strength/sensation: He has difficulty with his gait and walk without a cane. He notes spasticity in both legs. He feels that there is chronic mild right leg weakness but that the left leg weakness got worse more recently.   He notes some tingling in the feet but does not feel  that it is painful.   Baclofen has helped the spasticity.   Mirtazepine helped his sleep but he didn't feel he thought as clearly the next day and he prefers not to take.     Vision: He feels vision is doing ok with newer glasses. He has not noted changes in color vision. He notes no diplopia. .   Bladder: He notes increased urinary frequency but no incontinence.  Hesitancy is better on Flomax.     Fatigue/sleep: He notes fatigue and sleepiness in the afternoons that improved on Tysabri.  Sleeps well at night  Mood/cognition: He denies any depression or anxiety now but had some in the past.   He has been diagnosed with bipolar disease and is on a psychiatric medicine but does not know the name.. Notes some decreased focus and attention but notes no major cognitive problems.  MS history: Bradley Brandt was diagnosed around 2001 when he presented with right-sided numbness and clumsiness and weakness.   Initially, he was on Avonex and then Rebif but had relapses on both of them.  He could not tolerate Tecfidera.   He was started on Tysabri about 1 year ago and his last infusion was at [facility] at 11/08/2015.  He was seen Dr. Gaynelle Adu  [facility] and those records were reviewed. He did receive a course of Solu-Medrol March 2015 and another course of Solu-Medrol January 2016 when he presented with left leg weakness and numbness that was new    An MRI of the brain 08/28/2013 personally reviewed shows old extensive white matter changes in both hemispheres with multiple sclerosis. There were new areas of restricted diffusion without definite enhancement that could be consistent with ongoing demyelination when compared to the MRI from 05/13/2013.     REVIEW OF SYSTEMS: Constitutional: No fevers, chills, sweats, or change in appetite.   Notes fatigue Eyes: No visual changes, double vision, eye pain Ear, nose and throat: No hearing loss, ear pain, nasal congestion, sore  throat Cardiovascular: No chest pain, palpitations Respiratory: No shortness of breath at rest or with exertion.   No wheezes GastrointestinaI: No nausea, vomiting, diarrhea, abdominal pain, fecal incontinence Genitourinary: He has hesitancy and frequency.   No UTI's. Musculoskeletal: No neck pain, back pain Integumentary: No rash, pruritus, skin lesions Neurological: as above Psychiatric: No depression at this time.  No anxiety Endocrine: No palpitations, diaphoresis, change in appetite, change in weigh or increased thirst Hematologic/Lymphatic: No anemia, purpura, petechiae. Allergic/Immunologic: No itchy/runny eyes, nasal congestion, recent allergic reactions, rashes  ALLERGIES: Allergies  Allergen Reactions  . Lithium Rash    HOME MEDICATIONS:  Current Outpatient Medications:  .  baclofen (LIORESAL) 20 MG tablet, Take 1 tablet (20 mg total) by mouth 3 (three) times daily., Disp: 90 tablet, Rfl: 11 .  gabapentin (NEURONTIN) 600 MG tablet, Take 1 tablet (600 mg total) by mouth 3 (three) times daily., Disp: 90 tablet, Rfl: 11 .  natalizumab (TYSABRI) 300 MG/15ML injection, Inject 15 mLs (300 mg total) into the vein every 28 (twenty-eight) days., Disp: 15 mL, Rfl: 5 .  tamsulosin (FLOMAX) 0.4 MG CAPS capsule, Take 2 capsules (0.8 mg total) by mouth daily., Disp: 60 capsule, Rfl: 11 .  acetaminophen (TYLENOL) 500 MG tablet, Take 500 mg by mouth 2 (two) times daily., Disp: , Rfl:  .  FLUoxetine (PROZAC) 20 MG capsule, Take 1 capsule (20 mg total) by mouth daily. ., Disp: 30 capsule, Rfl: 11 .  QUEtiapine (SEROQUEL) 25 MG tablet, Take 1 tablet (25 mg total) by mouth at bedtime. Take 1 or 2 tablets at night, Disp: 60 tablet, Rfl: 5  PAST MEDICAL HISTORY: Past Medical History:  Diagnosis Date  . Bipolar 1 disorder (HCC)   . Bipolar disorder, unspecified (HCC) 01/03/2013  . MS (multiple sclerosis) (HCC) dx 2006   relapsing-remitting right sided weakness  . Neuromuscular disorder  (HCC)    MS    PAST SURGICAL HISTORY: Past Surgical History:  Procedure Laterality Date  . TUMOR REMOVAL  1983   abdomen benign    FAMILY HISTORY: Family History  Adopted: Yes  Problem Relation Age of Onset  . Lupus Mother   . Ataxia Neg Hx   . Chorea Neg Hx   . Dementia Neg Hx   . Mental retardation Neg Hx   . Migraines Neg Hx   . Multiple sclerosis Neg Hx   . Neurofibromatosis Neg Hx   . Neuropathy Neg Hx   . Parkinsonism Neg Hx   . Seizures Neg Hx   . Stroke Neg Hx     SOCIAL HISTORY:  Social History   Socioeconomic History  . Marital status: Single    Spouse name: Not on file  . Number of children: Not on file  . Years of education: Not on  file  . Highest education level: Not on file  Social Needs  . Financial resource strain: Not on file  . Food insecurity - worry: Not on file  . Food insecurity - inability: Not on file  . Transportation needs - medical: Not on file  . Transportation needs - non-medical: Not on file  Occupational History  . Not on file  Tobacco Use  . Smoking status: Former Smoker    Packs/day: 0.50    Types: Cigarettes  . Smokeless tobacco: Former Neurosurgeon  . Tobacco comment: patient needs to quit   Substance and Sexual Activity  . Alcohol use: Yes    Alcohol/week: 1.2 oz    Types: 2 Cans of beer per week    Comment:  two to three beers a week  . Drug use: Yes    Frequency: 7.0 times per week    Types: Marijuana  . Sexual activity: Yes    Partners: Female  Other Topics Concern  . Not on file  Social History Narrative  . Not on file     PHYSICAL EXAM  Vitals:   07/03/17 0850  BP: 118/68  Pulse: 62  Resp: 18  Weight: 243 lb 8 oz (110.5 kg)  Height: 5\' 11"  (1.803 m)    Body mass index is 33.96 kg/m.   General: The patient is well-developed and well-nourished and in no acute distress   Neurologic Exam  Mental status: The patient is alert and oriented x 3 at the time of the examination. The patient has apparent  normal recent and remote memory, with an apparently normal attention span and concentration ability.   Speech is normal.  Cranial nerves: Extraocular movements are full.  Facial strength and sensation is normal. Trapezius strength is strong.  The tongue is midline, and the patient has symmetric elevation of the soft palate. No obvious hearing deficits are noted.  Motor:  Muscle bulk is normal.   Tone is increased in legs. Strength is  5 / 5 in arms and 3 to 4-/5 in legs, slightly worse on the right than left  Sensory: He has reduced vibration sensation in the right leg.   Coordination: Cerebellar testing reveals good finger-nose-finger and he is unable to do heel-to-shin bilaterally.  Gait and station: Station is normal.   Gait is wide and spastic and needs support with cane.   Romberg is negative.   Reflexes: Deep tendon reflexes are increased with spread at the knees and clonus at ankles.        DIAGNOSTIC DATA (LABS, IMAGING, TESTING) - I reviewed patient records, labs, notes, testing and imaging myself where available.  Lab Results  Component Value Date   WBC 4.3 08/23/2016   HGB 15.2 08/23/2016   HCT 44.4 08/23/2016   MCV 90 08/23/2016   PLT 200 08/23/2016      Component Value Date/Time   NA 140 02/09/2014 1334   K 4.1 02/09/2014 1334   CL 103 02/09/2014 1334   CO2 23 02/09/2014 1334   GLUCOSE 124 (H) 02/09/2014 1334   BUN 14 02/09/2014 1334   CREATININE 1.07 02/09/2014 1334   CALCIUM 9.6 02/09/2014 1334   PROT 6.7 12/16/2015 1341   ALBUMIN 4.3 12/16/2015 1341   AST 16 12/16/2015 1341   ALT 13 12/16/2015 1341   ALKPHOS 81 12/16/2015 1341   BILITOT 0.3 12/16/2015 1341   GFRNONAA 88 (L) 02/09/2014 1334   GFRAA >90 02/09/2014 1334       ASSESSMENT AND PLAN  Multiple  sclerosis (HCC) - Plan: For home use only DME Walker rolling  Gait disturbance - Plan: For home use only DME Walker rolling  Spastic gait  Urinary hesitancy  Numbness  Bipolar affective  disorder in remission (HCC)   1.   He will continue Tysabri at [facility]. He reports having an MRI while he was in prison we'll try to get those results. 2.   Gabapentin 600 mg by mouth 3 times a day and baclofen to 10 mg by mouth 3 times a day.   Resume fluoxetine. Seroquel 25-50 mg nightly for sleep and agitation. 3.   I wrote for a walker and also for physical therapy weekly while he is in prison. 4.   He will return to see me in 6 months or sooner if there are new or worsening neurologic symptoms.   Reed Eifert A. Epimenio Foot, MD, PhD 07/03/2017, 1:21 PM Certified in Neurology, Clinical Neurophysiology, Sleep Medicine, Pain Medicine and Neuroimaging  [facility] [address] [place], [place] [address] [phone]

## 2017-07-09 ENCOUNTER — Encounter (HOSPITAL_COMMUNITY): Payer: Self-pay

## 2017-07-09 ENCOUNTER — Encounter (HOSPITAL_COMMUNITY)
Admission: RE | Admit: 2017-07-09 | Discharge: 2017-07-09 | Disposition: A | Discharge status: Home / Self Care | Payer: Medicaid Other | Source: Ambulatory Visit | Attending: Neurology | Admitting: Neurology

## 2017-07-09 DIAGNOSIS — G35 Multiple sclerosis: Secondary | ICD-10-CM | POA: Diagnosis not present

## 2017-07-09 MED ORDER — SODIUM CHLORIDE 0.9 % IV SOLN
INTRAVENOUS | Status: DC
  Administered 2017-07-09: 09:00:00 via INTRAVENOUS

## 2017-07-09 MED ORDER — SODIUM CHLORIDE 0.9 % IV SOLN
300.0000 mg | INTRAVENOUS | Status: DC
  Administered 2017-07-09: 300 mg via INTRAVENOUS
  Filled 2017-07-09: qty 15

## 2017-07-09 NOTE — Discharge Instructions (Signed)
Natalizumab injection/Tysabri Infusion  °What is this medicine? °NATALIZUMAB (na ta LIZ you mab) is used to treat relapsing multiple sclerosis. This drug is not a cure. It is also used to treat Crohn's disease. °This medicine may be used for other purposes; ask your health care provider or pharmacist if you have questions. °COMMON BRAND NAME(S): Tysabri °What should I tell my health care provider before I take this medicine? °They need to know if you have any of these conditions: °-immune system problems °-progressive multifocal leukoencephalopathy (PML) °-an unusual or allergic reaction to natalizumab, other medicines, foods, dyes, or preservatives °-pregnant or trying to get pregnant °-breast-feeding °How should I use this medicine? °This medicine is for infusion into a vein. It is given by a health care professional in a hospital or clinic setting. °A special MedGuide will be given to you by the pharmacist with each prescription and refill. Be sure to read this information carefully each time. °Talk to your pediatrician regarding the use of this medicine in children. This medicine is not approved for use in children. °Overdosage: If you think you have taken too much of this medicine contact a poison control center or emergency room at once. °NOTE: This medicine is only for you. Do not share this medicine with others. °What if I miss a dose? °It is important not to miss your dose. Call your doctor or health care professional if you are unable to keep an appointment. °What may interact with this medicine? °-azathioprine °-cyclosporine °-interferon °-6-mercaptopurine °-methotrexate °-steroid medicines like prednisone or cortisone °-TNF-alpha inhibitors like adalimumab, etanercept, and infliximab °-vaccines °This list may not describe all possible interactions. Give your health care provider a list of all the medicines, herbs, non-prescription drugs, or dietary supplements you use. Also tell them if you smoke, drink  alcohol, or use illegal drugs. Some items may interact with your medicine. °What should I watch for while using this medicine? °Your condition will be monitored carefully while you are receiving this medicine. Visit your doctor for regular check ups. Tell your doctor or healthcare professional if your symptoms do not start to get better or if they get worse. °Stay away from people who are sick. Call your doctor or health care professional for advice if you get a fever, chills or sore throat, or other symptoms of a cold or flu. Do not treat yourself. °In some patients, this medicine may cause a serious brain infection that may cause death. If you have any problems seeing, thinking, speaking, walking, or standing, tell your doctor right away. If you cannot reach your doctor, get urgent medical care. °What side effects may I notice from receiving this medicine? °Side effects that you should report to your doctor or health care professional as soon as possible: °-allergic reactions like skin rash, itching or hives, swelling of the face, lips, or tongue °-breathing problems °-changes in vision °-chest pain °-dark urine °-depression, feelings of sadness °-dizziness °-general ill feeling or flu-like symptoms °-irregular, missed, or painful menstrual periods °-light-colored stools °-loss of appetite, nausea °-muscle weakness °-problems with balance, talking, or walking °-right upper belly pain °-unusually weak or tired °-yellowing of the eyes or skin °Side effects that usually do not require medical attention (report to your doctor or health care professional if they continue or are bothersome): °-aches, pains °-headache °-stomach upset °-tiredness °This list may not describe all possible side effects. Call your doctor for medical advice about side effects. You may report side effects to FDA at 1-800-FDA-1088. °Where should   I keep my medicine? °This drug is given in a hospital or clinic and will not be stored at  home. °NOTE: This sheet is a summary. It may not cover all possible information. If you have questions about this medicine, talk to your doctor, pharmacist, or health care provider. °© 2018 Elsevier/Gold Standard (2008-07-25 13:33:21) ° °

## 2017-07-23 ENCOUNTER — Encounter: Payer: Self-pay | Admitting: *Deleted

## 2017-08-02 ENCOUNTER — Ambulatory Visit: Admitting: Neurology

## 2017-08-02 ENCOUNTER — Other Ambulatory Visit: Payer: Self-pay

## 2017-08-02 ENCOUNTER — Encounter: Payer: Self-pay | Admitting: Neurology

## 2017-08-02 VITALS — BP 117/74 | HR 73 | Resp 18 | Ht 71.0 in | Wt 239.5 lb

## 2017-08-02 DIAGNOSIS — F3170 Bipolar disorder, currently in remission, most recent episode unspecified: Secondary | ICD-10-CM | POA: Diagnosis not present

## 2017-08-02 DIAGNOSIS — R200 Anesthesia of skin: Secondary | ICD-10-CM

## 2017-08-02 DIAGNOSIS — R261 Paralytic gait: Secondary | ICD-10-CM | POA: Diagnosis not present

## 2017-08-02 DIAGNOSIS — G8191 Hemiplegia, unspecified affecting right dominant side: Secondary | ICD-10-CM

## 2017-08-02 DIAGNOSIS — Z79899 Other long term (current) drug therapy: Secondary | ICD-10-CM | POA: Insufficient documentation

## 2017-08-02 DIAGNOSIS — G35 Multiple sclerosis: Secondary | ICD-10-CM | POA: Diagnosis not present

## 2017-08-02 DIAGNOSIS — R3911 Hesitancy of micturition: Secondary | ICD-10-CM | POA: Diagnosis not present

## 2017-08-02 DIAGNOSIS — H469 Unspecified optic neuritis: Secondary | ICD-10-CM | POA: Insufficient documentation

## 2017-08-02 NOTE — Progress Notes (Signed)
[name]  PATIENT: Bradley Brandt DOB: 07-Jun-1981  REFERRING DOCTOR OR PCP:  Thomes Dinning SOURCE: patient, records form Dr. Gaynelle Adu, MRI reports and images on PACS  _________________________________   HISTORICAL  CHIEF COMPLAINT:  Chief Complaint  Patient presents with  . Multiple Sclerosis    Last Tysabri infusin was 07/09/17,and next inf. sched. for 08/08/17. JCV ab last drawn 08/23/16 and was Negative at 0.12.  Sts. has seen the eye doctor for poor vision bilat, and was told he needs to f/u with RAS.  He is not able to be more specific. Denis eye pain, double vision, sts. vision is just bad if he takes his glasses off/fim    HISTORY OF PRESENT ILLNESS:  Bradley Brandt is a 37 yo man with multiple sclerosis.     Update 08/02/2017: He is on Tysabri. He tolerates it well. His last JCV antibody was negative and 0.12.     He reports vision is worse on the right and his ophthalmologist told him to come back to see me.   Mr. Reader states that he can't see well without his glasses but is seeing ok with them when he uses both eyes.[name]   His right eye worsened sometime over the past 3 months (started while still in prison).  He is uncertain if the vision worsened rapidly or over a longer time span. He feels his vision is about the same now as a couple weeks ago.  He continues to have trouble walking and can go about 100 feet with a cane without stopping.    Leg strength is symmetric.  He has some spasticity.     He notes pins and needles dysesthesia in his legs.   Arms have no weakness or numbness.      Bladder hesitancy is better on Flomax but he has occasional incontinence at night.     Fatigue is moderate many days.     Update 07/03/2017: He is on Tysabri and he tolerated it well.   He has not had any exacerbation recently.   He notes reduced gait and can walk abput 50-100 feet without stopping.    He feels both legs are weak and left = right.    He notes spasticity  in both legs.   He notes a pins/needles tingling dysesthesia in both legs.   His arms are strong.    He has trouble with urinary hesitancy and often needs to push to empty.    He has not had any UTI.    Flomax has helped.        He notes increased fatigue but also has trouble falling asleep.        He has finished his jail term and is back home.    From 08/23/2016: He is currently on Tysabri. He tolerates it well and feels that his MS has done better on Tysabri than any other medication. He also notes that it helps his fatigue. He reports being JCV antibody negative  Gait/strength/sensation: He has difficulty with his gait and walk without a cane. He notes spasticity in both legs. He feels that there is chronic mild right leg weakness but that the left leg weakness got worse more recently.   He notes some tingling in the feet but does not feel that it is painful.   Baclofen has helped the spasticity.   Mirtazepine helped his sleep but he didn't feel he thought as clearly the next day and he prefers not to  take.     Vision: He feels vision is doing ok with newer glasses. He has not noted changes in color vision. He notes no diplopia. .   Bladder: He notes increased urinary frequency but no incontinence.  Hesitancy is better on Flomax.     Fatigue/sleep: He notes fatigue and sleepiness in the afternoons that improved on Tysabri.  Sleeps well at night  Mood/cognition: He denies any depression or anxiety now but had some in the past.   He has been diagnosed with bipolar disease and is on a psychiatric medicine but does not know the name.. Notes some decreased focus and attention but notes no major cognitive problems.  MS history: Bradley Brandt was diagnosed around 2001 when he presented with right-sided numbness and clumsiness and weakness.   Initially, he was on Avonex and then Rebif but had relapses on both of them.  He could not tolerate Tecfidera.   He was started on Tysabri about 1 year ago and his last  infusion was at [facility] at 11/08/2015.  He was seen Dr. Gaynelle Adu [facility] and those records were reviewed. He did receive a course of Solu-Medrol March 2015 and another course of Solu-Medrol January 2016 when he presented with left leg weakness and numbness that was new    An MRI of the brain 08/28/2013 personally reviewed shows old extensive white matter changes in both hemispheres with multiple sclerosis. There were new areas of restricted diffusion without definite enhancement that could be consistent with ongoing demyelination when compared to the MRI from 05/13/2013.     REVIEW OF SYSTEMS: Constitutional: No fevers, chills, sweats, or change in appetite.   Notes fatigue Eyes: as above Ear, nose and throat: No hearing loss, ear pain, nasal congestion, sore throat Cardiovascular: No chest pain, palpitations Respiratory: No shortness of breath at rest or with exertion.   No wheezes GastrointestinaI: No nausea, vomiting, diarrhea, abdominal pain, fecal incontinence Genitourinary: He has hesitancy and frequency.   No UTI's. Musculoskeletal: No neck pain, back pain Integumentary: No rash, pruritus, skin lesions Neurological: as above Psychiatric: No depression at this time.  No anxiety Endocrine: No palpitations, diaphoresis, change in appetite, change in weigh or increased thirst Hematologic/Lymphatic: No anemia, purpura, petechiae. Allergic/Immunologic: No itchy/runny eyes, nasal congestion, recent allergic reactions, rashes  ALLERGIES: Allergies  Allergen Reactions  . Lithium Rash    HOME MEDICATIONS:  Current Outpatient Medications:  .  acetaminophen (TYLENOL) 500 MG tablet, Take 500 mg by mouth 2 (two) times daily., Disp: , Rfl:  .  baclofen (LIORESAL) 20 MG tablet, Take 1 tablet (20 mg total) by mouth 3 (three) times daily., Disp: 90 tablet, Rfl: 11 .  FLUoxetine (PROZAC) 20 MG capsule, Take 1 capsule (20 mg total) by mouth daily. ., Disp: 30  capsule, Rfl: 11 .  gabapentin (NEURONTIN) 600 MG tablet, Take 1 tablet (600 mg total) by mouth 3 (three) times daily., Disp: 90 tablet, Rfl: 11 .  natalizumab (TYSABRI) 300 MG/15ML injection, Inject 15 mLs (300 mg total) into the vein every 28 (twenty-eight) days., Disp: 15 mL, Rfl: 5 .  QUEtiapine (SEROQUEL) 25 MG tablet, Take 1 tablet (25 mg total) by mouth at bedtime. Take 1 or 2 tablets at night, Disp: 60 tablet, Rfl: 5 .  tamsulosin (FLOMAX) 0.4 MG CAPS capsule, Take 2 capsules (0.8 mg total) by mouth daily., Disp: 60 capsule, Rfl: 11  PAST MEDICAL HISTORY: Past Medical History:  Diagnosis Date  . Bipolar 1 disorder (HCC)   .  Bipolar disorder, unspecified (HCC) 01/03/2013  . MS (multiple sclerosis) (HCC) dx 2006   relapsing-remitting right sided weakness  . Neuromuscular disorder (HCC)    MS    PAST SURGICAL HISTORY: Past Surgical History:  Procedure Laterality Date  . TUMOR REMOVAL  1983   abdomen benign    FAMILY HISTORY: Family History  Adopted: Yes  Problem Relation Age of Onset  . Lupus Mother   . Ataxia Neg Hx   . Chorea Neg Hx   . Dementia Neg Hx   . Mental retardation Neg Hx   . Migraines Neg Hx   . Multiple sclerosis Neg Hx   . Neurofibromatosis Neg Hx   . Neuropathy Neg Hx   . Parkinsonism Neg Hx   . Seizures Neg Hx   . Stroke Neg Hx     SOCIAL HISTORY:  Social History   Socioeconomic History  . Marital status: Single    Spouse name: Not on file  . Number of children: Not on file  . Years of education: Not on file  . Highest education level: Not on file  Social Needs  . Financial resource strain: Not on file  . Food insecurity - worry: Not on file  . Food insecurity - inability: Not on file  . Transportation needs - medical: Not on file  . Transportation needs - non-medical: Not on file  Occupational History  . Not on file  Tobacco Use  . Smoking status: Current Every Day Smoker    Packs/day: 0.50    Types: Cigarettes  . Smokeless  tobacco: Never Used  . Tobacco comment: patient needs to quit   Substance and Sexual Activity  . Alcohol use: Yes    Alcohol/week: 1.2 oz    Types: 2 Cans of beer per week    Comment:  two to three beers a week  . Drug use: Yes    Frequency: 7.0 times per week    Types: Marijuana  . Sexual activity: Yes    Partners: Female  Other Topics Concern  . Not on file  Social History Narrative  . Not on file     PHYSICAL EXAM  Vitals:   08/02/17 1036  BP: 117/74  Pulse: 73  Resp: 18  Weight: 239 lb 8 oz (108.6 kg)  Height: 5\' 11"  (1.803 m)    Body mass index is 33.4 kg/m.   General: The patient is well-developed and well-nourished and in no acute distress.  Eyes:   VA is 20/30 OS and 20/200 OD.    Monocular temporal hemianopsia on the right.   Fundoscopic exam shows mild right ON pallor.   Normal vessels.     2+ right APD.     Reduced color vision on the right.    Neurologic Exam  Mental status: The patient is alert and oriented x 3 at the time of the examination. The patient has apparent normal recent and remote memory, with an apparently normal attention span and concentration ability.   Speech is normal.  Cranial nerves: Extraocular movements are full.  Facial strength and sensation is normal. Trapezius strength is strong.  The tongue is midline, and the patient has symmetric elevation of the soft palate. No obvious hearing deficits are noted.  Motor:  Muscle bulk is normal.   Tone is increased in legs. Strength is  5 / 5 in arms and 4/5 in legs, slightly worse on the right than left  Sensory: He has reduced vibration sensation in the right leg.  Coordination: Cerebellar testing reveals good finger-nose-finger and he is unable to do heel-to-shin bilaterally.  Gait and station: Station is normal.   The gait is wide and spastic, more on the right. He can take a few steps without a cane but is more stable with his walking stick.   Romberg is negative.   Reflexes: Deep  tendon reflexes are increased with spread at the knees and nonsustained clonus at the right ankle.[name]        DIAGNOSTIC DATA (LABS, IMAGING, TESTING) - I reviewed patient records, labs, notes, testing and imaging myself where available.  Lab Results  Component Value Date   WBC 4.3 08/23/2016   HGB 15.2 08/23/2016   HCT 44.4 08/23/2016   MCV 90 08/23/2016   PLT 200 08/23/2016      Component Value Date/Time   NA 140 02/09/2014 1334   K 4.1 02/09/2014 1334   CL 103 02/09/2014 1334   CO2 23 02/09/2014 1334   GLUCOSE 124 (H) 02/09/2014 1334   BUN 14 02/09/2014 1334   CREATININE 1.07 02/09/2014 1334   CALCIUM 9.6 02/09/2014 1334   PROT 6.7 12/16/2015 1341   ALBUMIN 4.3 12/16/2015 1341   AST 16 12/16/2015 1341   ALT 13 12/16/2015 1341   ALKPHOS 81 12/16/2015 1341   BILITOT 0.3 12/16/2015 1341   GFRNONAA 88 (L) 02/09/2014 1334   GFRAA >90 02/09/2014 1334       ASSESSMENT AND PLAN  Relapsing remitting multiple sclerosis (HCC) - Plan: Stratify JCV Antibody Test ([facility]), CBC with Differential/Platelet  Optic neuritis  Spastic gait  Urinary hesitancy  Numbness  Bipolar affective disorder in remission (HCC)  Right hemiparesis (HCC)  High risk medication use - Plan: Stratify JCV Antibody Test ([facility]), CBC with Differential/Platelet   1.   Continue Tysabri 300 mg every 4 weeks. We need to check his JCV antibody. If he has converted to middle or high positive we will need to consider a different medication as a disease modifying therapy. 2.   Right eye symptoms might represent a right optic neuritis. Symptoms have been present for a couple months so I do not think he would get any benefit from steroids. We discussed that a lot of time symptoms will improve but not always 100%. He will follow-up with his optometrist.. 3.    He will return to see me in 5 months or sooner if there are new or worsening neurologic symptoms.   Garrett Bowring A. Epimenio Foot, MD, PhD 08/02/2017, 11:15  AM Certified in Neurology, Clinical Neurophysiology, Sleep Medicine, Pain Medicine and Neuroimaging  [facility] [address] [place], [place] [address] [phone]

## 2017-08-03 LAB — CBC WITH DIFFERENTIAL/PLATELET
Basophils Absolute: 0.1 10*3/uL (ref 0.0–0.2)
Basos: 1 %
EOS (ABSOLUTE): 0.2 10*3/uL (ref 0.0–0.4)
Eos: 2 %
Hematocrit: 46.8 % (ref 37.5–51.0)
Hemoglobin: 15.9 g/dL (ref 13.0–17.7)
IMMATURE GRANS (ABS): 0 10*3/uL (ref 0.0–0.1)
IMMATURE GRANULOCYTES: 0 %
LYMPHS: 48 %
Lymphocytes Absolute: 3.8 10*3/uL — ABNORMAL HIGH (ref 0.7–3.1)
MCH: 31.9 pg (ref 26.6–33.0)
MCHC: 34 g/dL (ref 31.5–35.7)
MCV: 94 fL (ref 79–97)
MONOS ABS: 0.6 10*3/uL (ref 0.1–0.9)
Monocytes: 7 %
NEUTROS PCT: 42 %
Neutrophils Absolute: 3.3 10*3/uL (ref 1.4–7.0)
PLATELETS: 188 10*3/uL (ref 150–379)
RBC: 4.98 x10E6/uL (ref 4.14–5.80)
RDW: 15.8 % — AB (ref 12.3–15.4)
WBC: 7.9 10*3/uL (ref 3.4–10.8)

## 2017-08-08 ENCOUNTER — Encounter (HOSPITAL_COMMUNITY)
Admission: RE | Admit: 2017-08-08 | Discharge: 2017-08-08 | Disposition: A | Discharge status: Home / Self Care | Payer: Medicaid Other | Source: Ambulatory Visit | Attending: Neurology | Admitting: Neurology

## 2017-08-08 ENCOUNTER — Encounter (HOSPITAL_COMMUNITY): Payer: Self-pay

## 2017-08-08 DIAGNOSIS — G35 Multiple sclerosis: Secondary | ICD-10-CM | POA: Insufficient documentation

## 2017-08-08 MED ORDER — SODIUM CHLORIDE 0.9 % IV SOLN
300.0000 mg | INTRAVENOUS | Status: DC
  Administered 2017-08-08: 300 mg via INTRAVENOUS
  Filled 2017-08-08: qty 15

## 2017-08-08 MED ORDER — SODIUM CHLORIDE 0.9 % IV SOLN
INTRAVENOUS | Status: DC
  Administered 2017-08-08: 13:00:00 via INTRAVENOUS

## 2017-08-08 NOTE — Discharge Instructions (Signed)

## 2017-08-13 ENCOUNTER — Encounter: Payer: Self-pay | Admitting: *Deleted

## 2017-09-05 ENCOUNTER — Encounter (HOSPITAL_COMMUNITY)
Admission: RE | Admit: 2017-09-05 | Discharge: 2017-09-05 | Disposition: A | Discharge status: Home / Self Care | Payer: Medicaid Other | Source: Ambulatory Visit | Attending: Neurology | Admitting: Neurology

## 2017-09-05 ENCOUNTER — Encounter (HOSPITAL_COMMUNITY): Payer: Self-pay

## 2017-09-05 DIAGNOSIS — G35 Multiple sclerosis: Secondary | ICD-10-CM | POA: Insufficient documentation

## 2017-09-05 MED ORDER — SODIUM CHLORIDE 0.9 % IV SOLN
300.0000 mg | INTRAVENOUS | Status: AC
  Administered 2017-09-05: 300 mg via INTRAVENOUS
  Filled 2017-09-05: qty 15

## 2017-09-05 MED ORDER — SODIUM CHLORIDE 0.9 % IV SOLN: INTRAVENOUS | Status: AC

## 2017-09-05 NOTE — Progress Notes (Signed)
Pt accompanied by his father.  He tolerated infusion well however stayed for only 20 minutes of recommended 1 hour post infusion observation time. Verbalized understanding of calling MD or reporting to ED with problems.

## 2017-10-03 ENCOUNTER — Encounter (HOSPITAL_COMMUNITY)
Admission: RE | Admit: 2017-10-03 | Discharge: 2017-10-03 | Disposition: A | Discharge status: Home / Self Care | Payer: Medicaid Other | Source: Ambulatory Visit | Attending: Neurology | Admitting: Neurology

## 2017-10-03 ENCOUNTER — Encounter (HOSPITAL_COMMUNITY): Payer: Self-pay

## 2017-10-03 DIAGNOSIS — G35 Multiple sclerosis: Secondary | ICD-10-CM | POA: Insufficient documentation

## 2017-10-03 MED ORDER — SODIUM CHLORIDE 0.9 % IV SOLN
300.0000 mg | INTRAVENOUS | Status: DC
  Administered 2017-10-03: 300 mg via INTRAVENOUS
  Filled 2017-10-03: qty 15

## 2017-10-03 MED ORDER — SODIUM CHLORIDE 0.9 % IV SOLN
INTRAVENOUS | Status: AC
  Administered 2017-10-03: 09:00:00 via INTRAVENOUS

## 2017-10-31 ENCOUNTER — Encounter (HOSPITAL_COMMUNITY)
Admission: RE | Admit: 2017-10-31 | Discharge: 2017-10-31 | Disposition: A | Discharge status: Home / Self Care | Payer: Medicaid Other | Source: Ambulatory Visit | Attending: Neurology | Admitting: Neurology

## 2017-10-31 DIAGNOSIS — G35 Multiple sclerosis: Secondary | ICD-10-CM | POA: Insufficient documentation

## 2017-10-31 MED ORDER — SODIUM CHLORIDE 0.9 % IV SOLN
INTRAVENOUS | Status: AC
  Administered 2017-10-31: 09:00:00 via INTRAVENOUS

## 2017-10-31 MED ORDER — SODIUM CHLORIDE 0.9 % IV SOLN
300.0000 mg | INTRAVENOUS | Status: DC
  Administered 2017-10-31: 300 mg via INTRAVENOUS
  Filled 2017-10-31: qty 15

## 2017-11-28 ENCOUNTER — Telehealth (HOSPITAL_COMMUNITY): Payer: Self-pay | Admitting: General Practice

## 2017-11-28 ENCOUNTER — Encounter (HOSPITAL_COMMUNITY): Payer: Medicaid Other

## 2017-11-28 NOTE — Telephone Encounter (Signed)
Office was informed via [employer] that patient was a no call/no show for appointment that was scheduled for 11/28/17 @ 1000.

## 2017-11-29 ENCOUNTER — Encounter (HOSPITAL_COMMUNITY): Payer: Self-pay

## 2017-11-29 ENCOUNTER — Encounter (HOSPITAL_COMMUNITY)
Admission: RE | Admit: 2017-11-29 | Discharge: 2017-11-29 | Disposition: A | Discharge status: Home / Self Care | Payer: Medicaid Other | Source: Ambulatory Visit | Attending: Neurology | Admitting: Neurology

## 2017-11-29 DIAGNOSIS — G35 Multiple sclerosis: Secondary | ICD-10-CM | POA: Diagnosis not present

## 2017-11-29 MED ORDER — SODIUM CHLORIDE 0.9 % IV SOLN
INTRAVENOUS | Status: AC
  Administered 2017-11-29: 10:00:00 via INTRAVENOUS

## 2017-11-29 MED ORDER — NATALIZUMAB 300 MG/15ML IV CONC
300.0000 mg | INTRAVENOUS | Status: DC
  Administered 2017-11-29: 300 mg via INTRAVENOUS
  Filled 2017-11-29: qty 15

## 2017-11-29 NOTE — Progress Notes (Signed)
Patient here for Tysabri infusion. Tolerated well. Stayed only 20 min out of recommended one hour observation time. Patient and father aware to call MD for problems or questions.

## 2017-11-29 NOTE — Discharge Instructions (Signed)
Call MD for problems or questions.  Dr. Epimenio Foot [phone] 911 for emergencies    Tysabri Natalizumab injection What is this medicine? NATALIZUMAB (na ta LIZ you mab) is used to treat relapsing multiple sclerosis. This drug is not a cure. It is also used to treat Crohn's disease. This medicine may be used for other purposes; ask your health care provider or pharmacist if you have questions. COMMON BRAND NAME(S): Tysabri What should I tell my health care provider before I take this medicine? They need to know if you have any of these conditions: -immune system problems -progressive multifocal leukoencephalopathy (PML) -an unusual or allergic reaction to natalizumab, other medicines, foods, dyes, or preservatives -pregnant or trying to get pregnant -breast-feeding How should I use this medicine? This medicine is for infusion into a vein. It is given by a health care professional in a hospital or clinic setting. A special MedGuide will be given to you by the pharmacist with each prescription and refill. Be sure to read this information carefully each time. Talk to your pediatrician regarding the use of this medicine in children. This medicine is not approved for use in children. Overdosage: If you think you have taken too much of this medicine contact a poison control center or emergency room at once. NOTE: This medicine is only for you. Do not share this medicine with others. What if I miss a dose? It is important not to miss your dose. Call your doctor or health care professional if you are unable to keep an appointment. What may interact with this medicine? -azathioprine -cyclosporine -interferon -6-mercaptopurine -methotrexate -steroid medicines like prednisone or cortisone -TNF-alpha inhibitors like adalimumab, etanercept, and infliximab -vaccines This list may not describe all possible interactions. Give your health care provider a list of all the medicines, herbs,  non-prescription drugs, or dietary supplements you use. Also tell them if you smoke, drink alcohol, or use illegal drugs. Some items may interact with your medicine. What should I watch for while using this medicine? Your condition will be monitored carefully while you are receiving this medicine. Visit your doctor for regular check ups. Tell your doctor or healthcare professional if your symptoms do not start to get better or if they get worse. Stay away from people who are sick. Call your doctor or health care professional for advice if you get a fever, chills or sore throat, or other symptoms of a cold or flu. Do not treat yourself. In some patients, this medicine may cause a serious brain infection that may cause death. If you have any problems seeing, thinking, speaking, walking, or standing, tell your doctor right away. If you cannot reach your doctor, get urgent medical care. What side effects may I notice from receiving this medicine? Side effects that you should report to your doctor or health care professional as soon as possible: -allergic reactions like skin rash, itching or hives, swelling of the face, lips, or tongue -breathing problems -changes in vision -chest pain -dark urine -depression, feelings of sadness -dizziness -general ill feeling or flu-like symptoms -irregular, missed, or painful menstrual periods -light-colored stools -loss of appetite, nausea -muscle weakness -problems with balance, talking, or walking -right upper belly pain -unusually weak or tired -yellowing of the eyes or skin Side effects that usually do not require medical attention (report to your doctor or health care professional if they continue or are bothersome): -aches, pains -headache -stomach upset -tiredness This list may not describe all possible side effects. Call your doctor for medical  advice about side effects. You may report side effects to FDA at 1-800-FDA-1088. Where should I keep my  medicine? This drug is given in a hospital or clinic and will not be stored at home. NOTE: This sheet is a summary. It may not cover all possible information. If you have questions about this medicine, talk to your doctor, pharmacist, or health care provider.  2018 Elsevier/Gold Standard (2008-07-25 13:33:21)

## 2017-12-27 ENCOUNTER — Encounter (HOSPITAL_COMMUNITY)
Admission: RE | Admit: 2017-12-27 | Discharge: 2017-12-27 | Disposition: A | Discharge status: Home / Self Care | Payer: Medicaid Other | Source: Ambulatory Visit | Attending: Neurology | Admitting: Neurology

## 2017-12-27 ENCOUNTER — Encounter (HOSPITAL_COMMUNITY): Payer: Self-pay

## 2017-12-27 DIAGNOSIS — G35 Multiple sclerosis: Secondary | ICD-10-CM | POA: Diagnosis not present

## 2017-12-27 MED ORDER — SODIUM CHLORIDE 0.9 % IV SOLN
INTRAVENOUS | Status: AC
  Administered 2017-12-27: 12:00:00 via INTRAVENOUS

## 2017-12-27 MED ORDER — NATALIZUMAB 300 MG/15ML IV CONC
300.0000 mg | INTRAVENOUS | Status: DC
  Administered 2017-12-27: 300 mg via INTRAVENOUS
  Filled 2017-12-27: qty 15

## 2017-12-27 NOTE — Progress Notes (Signed)
Pt chose to not stay recommended 1 hour post infusion assessment time.

## 2017-12-28 ENCOUNTER — Encounter (HOSPITAL_COMMUNITY): Payer: Medicaid Other

## 2017-12-31 ENCOUNTER — Telehealth: Payer: Self-pay | Admitting: Neurology

## 2017-12-31 ENCOUNTER — Ambulatory Visit: Admitting: Neurology

## 2017-12-31 ENCOUNTER — Encounter: Payer: Self-pay | Admitting: Neurology

## 2017-12-31 ENCOUNTER — Other Ambulatory Visit: Payer: Self-pay

## 2017-12-31 VITALS — BP 108/65 | HR 55 | Resp 18 | Ht 71.0 in | Wt 230.5 lb

## 2017-12-31 DIAGNOSIS — R261 Paralytic gait: Secondary | ICD-10-CM

## 2017-12-31 DIAGNOSIS — G959 Disease of spinal cord, unspecified: Secondary | ICD-10-CM | POA: Insufficient documentation

## 2017-12-31 DIAGNOSIS — R3911 Hesitancy of micturition: Secondary | ICD-10-CM | POA: Diagnosis not present

## 2017-12-31 DIAGNOSIS — G8114 Spastic hemiplegia affecting left nondominant side: Secondary | ICD-10-CM

## 2017-12-31 DIAGNOSIS — R200 Anesthesia of skin: Secondary | ICD-10-CM | POA: Diagnosis not present

## 2017-12-31 DIAGNOSIS — G35 Multiple sclerosis: Secondary | ICD-10-CM

## 2017-12-31 DIAGNOSIS — Z79899 Other long term (current) drug therapy: Secondary | ICD-10-CM | POA: Diagnosis not present

## 2017-12-31 MED ORDER — QUETIAPINE FUMARATE 25 MG PO TABS: 25.0000 mg | ORAL_TABLET | Freq: Every day | ORAL | 5 refills | Status: DC

## 2017-12-31 MED ORDER — DALFAMPRIDINE ER 10 MG PO TB12: 10.0000 mg | ORAL_TABLET | Freq: Two times a day (BID) | ORAL | 5 refills | Status: DC

## 2017-12-31 MED ORDER — TAMSULOSIN HCL 0.4 MG PO CAPS: 0.8000 mg | ORAL_CAPSULE | Freq: Every day | ORAL | 11 refills | Status: DC

## 2017-12-31 MED ORDER — BACLOFEN 20 MG PO TABS: 20.0000 mg | ORAL_TABLET | Freq: Three times a day (TID) | ORAL | 11 refills | Status: DC

## 2017-12-31 MED ORDER — GABAPENTIN 600 MG PO TABS: 600.0000 mg | ORAL_TABLET | Freq: Three times a day (TID) | ORAL | 11 refills | Status: DC

## 2017-12-31 NOTE — Telephone Encounter (Signed)
Medicaid order sent to GI. They obtain the auth and will reach out to the pt to schedule.  °

## 2017-12-31 NOTE — Progress Notes (Signed)
[name]  PATIENT: Bradley Brandt DOB: Jul 25, 1980  REFERRING DOCTOR OR PCP:  Thomes Dinning SOURCE: patient, records form Dr. Gaynelle Adu, MRI reports and images on PACS  _________________________________   HISTORICAL  CHIEF COMPLAINT:  Chief Complaint  Patient presents with  . Multiple Sclerosis    Sts. he continues to tolerate Tysabri well.  JCV ab last checked 08/02/17 and was Indeterminate at 0.25.  Assay was negative.  Having more trouble walking, with speech.  Requesting rx. for a home health aide/fim    HISTORY OF PRESENT ILLNESS:  Bradley Brandt is a 37 yo man with multiple sclerosis.     Update 12/31/2017: He is out of prison and back home.      He is on Tysabri and tolerates it well (infusions are at [facility])   He denies any recent exacerbation.  He uses a walker for his gait though will furniture surf some in the house.   He has fallen a few times.    His right leg is stronger than the left leg.     Arm strength is mildly reduced on th e left and coordination is poor in that arm.   He has tingling dysesthesia in the left leg.   He feels his vision is worse but he did better with a new pair of glasses.       He has trouble emptying his bladder but has no incontinence.   Flomax has helped.     Mood is doing about the same.    He does better with Prozac for mood and Seroquel for anxiety and sleep.    Insomnia bothers him some nights.    He has fatigue. He feels cognition has been stable.      Update 08/02/2017: He is on Tysabri. He tolerates it well. His last JCV antibody was negative and 0.12.     He reports vision is worse on the right and his ophthalmologist told him to come back to see me.   Mr. Giammarco states that he can't see well without his glasses but is seeing ok with them when he uses both eyes.[name]   His right eye worsened sometime over the past 3 months (started while still in prison).  He is uncertain if the vision worsened rapidly or over a  longer time span. He feels his vision is about the same now as a couple weeks ago.  He continues to have trouble walking and can go about 100 feet with a cane without stopping.    Leg strength is symmetric.  He has some spasticity.     He notes pins and needles dysesthesia in his legs.   Arms have no weakness or numbness.      Bladder hesitancy is better on Flomax but he has occasional incontinence at night.     Fatigue is moderate many days.     He feels mood is doing ok.   He used to see psychiatry.   Update 07/03/2017: He is on Tysabri and he tolerated it well.   He has not had any exacerbation recently.   He notes reduced gait and can walk abput 50-100 feet without stopping.    He feels both legs are weak and left = right.    He notes spasticity in both legs.   He notes a pins/needles tingling dysesthesia in both legs.   His arms are strong.    He has trouble with urinary hesitancy and often needs to push  to empty.    He has not had any UTI.    Flomax has helped.        He notes increased fatigue but also has trouble falling asleep.        He has finished his jail term and is back home.    From 08/23/2016: He is currently on Tysabri. He tolerates it well and feels that his MS has done better on Tysabri than any other medication. He also notes that it helps his fatigue. He reports being JCV antibody negative  Gait/strength/sensation: He has difficulty with his gait and walk without a cane. He notes spasticity in both legs. He feels that there is chronic mild right leg weakness but that the left leg weakness got worse more recently.   He notes some tingling in the feet but does not feel that it is painful.   Baclofen has helped the spasticity.   Mirtazepine helped his sleep but he didn't feel he thought as clearly the next day and he prefers not to take.     Vision: He feels vision is doing ok with newer glasses. He has not noted changes in color vision. He notes no diplopia. .   Bladder: He  notes increased urinary frequency but no incontinence.  Hesitancy is better on Flomax.     Fatigue/sleep: He notes fatigue and sleepiness in the afternoons that improved on Tysabri.  Sleeps well at night  Mood/cognition: He denies any depression or anxiety now but had some in the past.   He has been diagnosed with bipolar disease and is on a psychiatric medicine but does not know the name.. Notes some decreased focus and attention but notes no major cognitive problems.  MS history: Wanda was diagnosed around 2001 when he presented with right-sided numbness and clumsiness and weakness.   Initially, he was on Avonex and then Rebif but had relapses on both of them.  He could not tolerate Tecfidera.   He was started on Tysabri about 1 year ago and his last infusion was at [facility] at 11/08/2015.  He was seen Dr. Gaynelle Adu [facility] and those records were reviewed. He did receive a course of Solu-Medrol March 2015 and another course of Solu-Medrol January 2016 when he presented with left leg weakness and numbness that was new    An MRI of the brain 08/28/2013 personally reviewed shows old extensive white matter changes in both hemispheres with multiple sclerosis. There were new areas of restricted diffusion without definite enhancement that could be consistent with ongoing demyelination when compared to the MRI from 05/13/2013.     REVIEW OF SYSTEMS: Constitutional: No fevers, chills, sweats, or change in appetite.   Notes fatigue Eyes: as above Ear, nose and throat: No hearing loss, ear pain, nasal congestion, sore throat Cardiovascular: No chest pain, palpitations Respiratory: No shortness of breath at rest or with exertion.   No wheezes GastrointestinaI: No nausea, vomiting, diarrhea, abdominal pain, fecal incontinence Genitourinary: He has hesitancy and frequency.   No UTI's. Musculoskeletal: No neck pain, back pain Integumentary: No rash, pruritus, skin  lesions Neurological: as above Psychiatric: No depression at this time.  No anxiety Endocrine: No palpitations, diaphoresis, change in appetite, change in weigh or increased thirst Hematologic/Lymphatic: No anemia, purpura, petechiae. Allergic/Immunologic: No itchy/runny eyes, nasal congestion, recent allergic reactions, rashes  ALLERGIES: Allergies  Allergen Reactions  . Lithium Rash    HOME MEDICATIONS:  Current Outpatient Medications:  .  acetaminophen (TYLENOL) 500 MG  tablet, Take 500 mg by mouth 2 (two) times daily., Disp: , Rfl:  .  baclofen (LIORESAL) 20 MG tablet, Take 1 tablet (20 mg total) by mouth 3 (three) times daily., Disp: 90 tablet, Rfl: 11 .  FLUoxetine (PROZAC) 20 MG capsule, Take 1 capsule (20 mg total) by mouth daily. ., Disp: 30 capsule, Rfl: 11 .  gabapentin (NEURONTIN) 600 MG tablet, Take 1 tablet (600 mg total) by mouth 3 (three) times daily., Disp: 90 tablet, Rfl: 11 .  natalizumab (TYSABRI) 300 MG/15ML injection, Inject 15 mLs (300 mg total) into the vein every 28 (twenty-eight) days., Disp: 15 mL, Rfl: 5 .  QUEtiapine (SEROQUEL) 25 MG tablet, Take 1 tablet (25 mg total) by mouth at bedtime. Take 1 or 2 tablets at night, Disp: 60 tablet, Rfl: 5 .  tamsulosin (FLOMAX) 0.4 MG CAPS capsule, Take 2 capsules (0.8 mg total) by mouth daily., Disp: 60 capsule, Rfl: 11 .  dalfampridine 10 MG TB12, Take 1 tablet (10 mg total) by mouth every 12 (twelve) hours., Disp: 60 tablet, Rfl: 5  PAST MEDICAL HISTORY: Past Medical History:  Diagnosis Date  . Bipolar 1 disorder (HCC)   . Bipolar disorder, unspecified (HCC) 01/03/2013  . MS (multiple sclerosis) (HCC) dx 2006   relapsing-remitting right sided weakness  . Neuromuscular disorder (HCC)    MS    PAST SURGICAL HISTORY: Past Surgical History:  Procedure Laterality Date  . TUMOR REMOVAL  1983   abdomen benign    FAMILY HISTORY: Family History  Adopted: Yes  Problem Relation Age of Onset  . Lupus Mother   .  Ataxia Neg Hx   . Chorea Neg Hx   . Dementia Neg Hx   . Mental retardation Neg Hx   . Migraines Neg Hx   . Multiple sclerosis Neg Hx   . Neurofibromatosis Neg Hx   . Neuropathy Neg Hx   . Parkinsonism Neg Hx   . Seizures Neg Hx   . Stroke Neg Hx     SOCIAL HISTORY:  Social History   Socioeconomic History  . Marital status: Single    Spouse name: Not on file  . Number of children: Not on file  . Years of education: Not on file  . Highest education level: Not on file  Occupational History  . Not on file  Social Needs  . Financial resource strain: Not on file  . Food insecurity:    Worry: Not on file    Inability: Not on file  . Transportation needs:    Medical: Not on file    Non-medical: Not on file  Tobacco Use  . Smoking status: Current Every Day Smoker    Packs/day: 0.50    Types: Cigarettes  . Smokeless tobacco: Never Used  . Tobacco comment: patient needs to quit   Substance and Sexual Activity  . Alcohol use: Yes    Alcohol/week: 1.2 oz    Types: 2 Cans of beer per week    Comment:  two to three beers a week  . Drug use: Yes    Frequency: 7.0 times per week    Types: Marijuana  . Sexual activity: Yes    Partners: Female  Lifestyle  . Physical activity:    Days per week: Not on file    Minutes per session: Not on file  . Stress: Not on file  Relationships  . Social connections:    Talks on phone: Not on file    Gets together: Not on  file    Attends religious service: Not on file    Active member of club or organization: Not on file    Attends meetings of clubs or organizations: Not on file    Relationship status: Not on file  . Intimate partner violence:    Fear of current or ex partner: Not on file    Emotionally abused: Not on file    Physically abused: Not on file    Forced sexual activity: Not on file  Other Topics Concern  . Not on file  Social History Narrative  . Not on file     PHYSICAL EXAM  Vitals:   12/31/17 0839  BP: 108/65   Pulse: (!) 55  Resp: 18  Weight: 230 lb 8 oz (104.6 kg)  Height: 5\' 11"  (1.803 m)    Body mass index is 32.15 kg/m.   General: The patient is well-developed and well-nourished and in no acute distress.   Neurologic Exam  Mental status: The patient is alert and oriented x 3 at the time of the examination. The patient has apparent normal recent and remote memory, with an apparently normal attention span and concentration ability.   Speech is normal.  Cranial nerves: Extraocular movements are full.   2+ right APD.     Reduced color vision on the right.  Facial strength and sensation is normal. Trapezius strength is strong.  The tongue is midline, and the patient has symmetric elevation of the soft palate. No obvious hearing deficits are noted.  Motor:  Muscle bulk is normal.   Left rapid altering movements are slow in the hand.  There is increased muscle tone in the left arm and both legs, left greater than right.  Strength is 5/5 in the right arm, 4+/5 in the left arm and right leg and 4-/5 in the left leg.  Sensory: He has reduced vibration sensation in the left leg.   Coordination: Cerebellar testing reveals mildly reduced left finger-nose-finger and he is unable to do heel-to-shin bilaterally.  Gait and station: Station is normal.  The gait is wide in spastic and he has a mild left foot drop.[name] He can walk with support.     Reflexes: Deep tendon reflexes are increased with spread at the knees and nonsustained clonus at the right ankle.[name]        DIAGNOSTIC DATA (LABS, IMAGING, TESTING) - I reviewed patient records, labs, notes, testing and imaging myself where available.  Lab Results  Component Value Date   WBC 7.9 08/02/2017   HGB 15.9 08/02/2017   HCT 46.8 08/02/2017   MCV 94 08/02/2017   PLT 188 08/02/2017      Component Value Date/Time   NA 140 02/09/2014 1334   K 4.1 02/09/2014 1334   CL 103 02/09/2014 1334   CO2 23 02/09/2014 1334   GLUCOSE 124 (H) 02/09/2014  1334   BUN 14 02/09/2014 1334   CREATININE 1.07 02/09/2014 1334   CALCIUM 9.6 02/09/2014 1334   PROT 6.7 12/16/2015 1341   ALBUMIN 4.3 12/16/2015 1341   AST 16 12/16/2015 1341   ALT 13 12/16/2015 1341   ALKPHOS 81 12/16/2015 1341   BILITOT 0.3 12/16/2015 1341   GFRNONAA 88 (L) 02/09/2014 1334   GFRAA >90 02/09/2014 1334       ASSESSMENT AND PLAN  Multiple sclerosis (HCC) - Plan: MR BRAIN W WO CONTRAST, MR CERVICAL SPINE W WO CONTRAST, CBC with Differential/Platelet, Stratify JCV Antibody Test ([facility]), Basic metabolic panel  Disease of spinal  cord (HCC) - Plan: MR CERVICAL SPINE W WO CONTRAST  Relapsing remitting multiple sclerosis (HCC)  Spastic hemiplegia of left nondominant side due to noncerebrovascular etiology (HCC)  Urinary hesitancy  Spastic gait  High risk medication use  Numbness   1.    We will continue Tysabri every 4 weeks 300 mg at [facility].   We will recheck the JCV antibody today. 2.    Continue gabapentin, baclofen and tamsulosin for the symptoms of his MS. 3.  Add Ampyra 10 mg twice daily to help with walking.  He does not have a history of seizures or kidney disease. 4.    He will return to see me in 5 months or sooner if there are new or worsening neurologic symptoms.   Lassie Demorest A. Epimenio Foot, MD, PhD 12/31/2017, 9:07 AM Certified in Neurology, Clinical Neurophysiology, Sleep Medicine, Pain Medicine and Neuroimaging  [facility] [address] [place], [place] [address] [phone]

## 2018-01-01 ENCOUNTER — Other Ambulatory Visit: Payer: Self-pay | Admitting: *Deleted

## 2018-01-01 DIAGNOSIS — G35 Multiple sclerosis: Secondary | ICD-10-CM

## 2018-01-01 DIAGNOSIS — R261 Paralytic gait: Secondary | ICD-10-CM

## 2018-01-01 LAB — BASIC METABOLIC PANEL
BUN/Creatinine Ratio: 16 (ref 9–20)
BUN: 19 mg/dL (ref 6–20)
CALCIUM: 9.3 mg/dL (ref 8.7–10.2)
CO2: 22 mmol/L (ref 20–29)
CREATININE: 1.2 mg/dL (ref 0.76–1.27)
Chloride: 108 mmol/L — ABNORMAL HIGH (ref 96–106)
GFR calc Af Amer: 88 mL/min/{1.73_m2} (ref >59)
GFR, EST NON AFRICAN AMERICAN: 76 mL/min/{1.73_m2} (ref >59)
GLUCOSE: 83 mg/dL (ref 65–99)
Potassium: 4.4 mmol/L (ref 3.5–5.2)
SODIUM: 141 mmol/L (ref 134–144)

## 2018-01-01 LAB — CBC WITH DIFFERENTIAL/PLATELET
BASOS: 1 %
Basophils Absolute: 0.1 10*3/uL (ref 0.0–0.2)
EOS (ABSOLUTE): 0.4 10*3/uL (ref 0.0–0.4)
EOS: 5 %
HEMATOCRIT: 45.7 % (ref 37.5–51.0)
Hemoglobin: 15.1 g/dL (ref 13.0–17.7)
Immature Grans (Abs): 0.1 10*3/uL (ref 0.0–0.1)
Immature Granulocytes: 1 %
LYMPHS ABS: 4.6 10*3/uL — AB (ref 0.7–3.1)
Lymphs: 55 %
MCH: 31.6 pg (ref 26.6–33.0)
MCHC: 33 g/dL (ref 31.5–35.7)
MCV: 96 fL (ref 79–97)
MONOCYTES: 7 %
MONOS ABS: 0.6 10*3/uL (ref 0.1–0.9)
NEUTROS ABS: 2.5 10*3/uL (ref 1.4–7.0)
NRBC: 2 % — AB (ref 0–0)
Neutrophils: 31 %
Platelets: 193 10*3/uL (ref 150–450)
RBC: 4.78 x10E6/uL (ref 4.14–5.80)
RDW: 14.7 % (ref 12.3–15.4)
WBC: 8.2 10*3/uL (ref 3.4–10.8)

## 2018-01-02 ENCOUNTER — Telehealth: Payer: Self-pay | Admitting: Neurology

## 2018-01-02 NOTE — Telephone Encounter (Signed)
[facility] and said that a form has to be filled out . [facility] said a form has to be filled out for DME  Services . Faith I have Downloaded form I will Put on your desk. Thanks Annabelle Harman.

## 2018-01-02 NOTE — Telephone Encounter (Signed)
Order has Been Faxed To [facility] Telephone [phone] -  Fax [phone]. I called and spoke to Patient he is aware of details.

## 2018-01-02 NOTE — Telephone Encounter (Signed)
Faxed Completed Form Back [phone]. DMA [id number] name form. For  DME orders's

## 2018-01-02 NOTE — Telephone Encounter (Signed)
Noted/fim 

## 2018-01-02 NOTE — Telephone Encounter (Signed)
Form completed and awaiting RAS sig/fim

## 2018-01-02 NOTE — Telephone Encounter (Signed)
-----   Message from Hayden Pedro, RN sent at 01/01/2018  9:56 AM EDT ----- Regarding: order for home health aide Dana--Pt. requesting a home health aide to help at home I put a dme order in for this, b/c the ambulatory referral to home health doesn't apply since it's not PT, ST, nursing. When I spoke with the rep from [facility] yesterday, he said pt's with only Medicaid qualify for home health aide services without having PT, or Nursing in the home. The patient is home bound, but doesn't need any services other than a home health aide, and should not need an aide for more than a few hrs. several times per wk. Do you think you can help with this referral?

## 2018-01-06 ENCOUNTER — Ambulatory Visit
Admission: RE | Admit: 2018-01-06 | Discharge: 2018-01-06 | Disposition: A | Discharge status: Home / Self Care | Payer: Medicaid Other | Source: Ambulatory Visit | Attending: Neurology | Admitting: Neurology

## 2018-01-06 DIAGNOSIS — G35 Multiple sclerosis: Secondary | ICD-10-CM

## 2018-01-06 DIAGNOSIS — G959 Disease of spinal cord, unspecified: Secondary | ICD-10-CM

## 2018-01-06 MED ORDER — GADOBENATE DIMEGLUMINE 529 MG/ML IV SOLN
20.0000 mL | Freq: Once | INTRAVENOUS | Status: AC | PRN
  Administered 2018-01-06: 20 mL via INTRAVENOUS

## 2018-01-08 ENCOUNTER — Telehealth: Payer: Self-pay | Admitting: *Deleted

## 2018-01-08 NOTE — Telephone Encounter (Signed)
Spoke with Casimiro Needle and reviewed below MRI results.  He verbalized understanding of same/fim

## 2018-01-08 NOTE — Telephone Encounter (Signed)
-----   Message from Asa Lente, MD sent at 01/07/2018  5:22 PM EDT ----- Please let him know that the MRI of the brain shows only a couple small MS plaques that were not present in 2015 and none of them appear to be recent.  The MRI of the cervical spine is unchanged compared to 2015.  We should continue with current medications.

## 2018-01-21 ENCOUNTER — Encounter: Payer: Self-pay | Admitting: *Deleted

## 2018-01-25 ENCOUNTER — Encounter (HOSPITAL_COMMUNITY): Payer: Medicaid Other

## 2018-01-30 ENCOUNTER — Encounter (HOSPITAL_COMMUNITY)
Admission: RE | Admit: 2018-01-30 | Discharge: 2018-01-30 | Disposition: A | Discharge status: Home / Self Care | Payer: Medicaid Other | Source: Ambulatory Visit | Attending: Neurology | Admitting: Neurology

## 2018-01-30 DIAGNOSIS — G35 Multiple sclerosis: Secondary | ICD-10-CM | POA: Insufficient documentation

## 2018-01-30 MED ORDER — SODIUM CHLORIDE 0.9 % IV SOLN
INTRAVENOUS | Status: AC
  Administered 2018-01-30: 10:00:00 via INTRAVENOUS

## 2018-01-30 MED ORDER — SODIUM CHLORIDE 0.9 % IV SOLN
300.0000 mg | INTRAVENOUS | Status: DC
  Administered 2018-01-30: 300 mg via INTRAVENOUS
  Filled 2018-01-30: qty 15

## 2018-01-30 NOTE — Discharge Instructions (Signed)

## 2018-03-08 ENCOUNTER — Encounter (HOSPITAL_COMMUNITY)
Admission: RE | Admit: 2018-03-08 | Discharge: 2018-03-08 | Disposition: A | Discharge status: Home / Self Care | Payer: Medicaid Other | Source: Ambulatory Visit | Attending: Neurology | Admitting: Neurology

## 2018-03-08 ENCOUNTER — Encounter (HOSPITAL_COMMUNITY): Payer: Self-pay

## 2018-03-08 DIAGNOSIS — G35 Multiple sclerosis: Secondary | ICD-10-CM | POA: Insufficient documentation

## 2018-03-08 MED ORDER — SODIUM CHLORIDE 0.9 % IV SOLN
300.0000 mg | INTRAVENOUS | Status: DC
  Administered 2018-03-08: 300 mg via INTRAVENOUS
  Filled 2018-03-08: qty 15

## 2018-03-08 MED ORDER — SODIUM CHLORIDE 0.9 % IV SOLN
INTRAVENOUS | Status: AC
  Administered 2018-03-08: 08:00:00 via INTRAVENOUS

## 2018-04-09 ENCOUNTER — Encounter (HOSPITAL_COMMUNITY): Payer: Self-pay

## 2018-04-09 ENCOUNTER — Encounter (HOSPITAL_COMMUNITY)
Admission: RE | Admit: 2018-04-09 | Discharge: 2018-04-09 | Disposition: A | Discharge status: Home / Self Care | Payer: Medicaid Other | Source: Ambulatory Visit | Attending: Neurology | Admitting: Neurology

## 2018-04-09 DIAGNOSIS — G35 Multiple sclerosis: Secondary | ICD-10-CM | POA: Diagnosis not present

## 2018-04-09 MED ORDER — SODIUM CHLORIDE 0.9 % IV SOLN
INTRAVENOUS | Status: AC
  Administered 2018-04-09: 09:00:00 via INTRAVENOUS

## 2018-04-09 MED ORDER — SODIUM CHLORIDE 0.9 % IV SOLN
300.0000 mg | INTRAVENOUS | Status: AC
  Administered 2018-04-09: 300 mg via INTRAVENOUS
  Filled 2018-04-09: qty 15

## 2018-05-06 ENCOUNTER — Telehealth: Payer: Self-pay | Admitting: *Deleted

## 2018-05-06 NOTE — Telephone Encounter (Signed)
Received fax notification from [facility] that JCV lab drawn on 12/31/17 negative, titer: 0.19

## 2018-05-07 ENCOUNTER — Encounter (HOSPITAL_COMMUNITY)
Admission: RE | Admit: 2018-05-07 | Discharge: 2018-05-07 | Disposition: A | Discharge status: Home / Self Care | Payer: Medicaid Other | Source: Ambulatory Visit | Attending: Neurology | Admitting: Neurology

## 2018-05-07 DIAGNOSIS — G35 Multiple sclerosis: Secondary | ICD-10-CM | POA: Diagnosis not present

## 2018-05-07 MED ORDER — SODIUM CHLORIDE 0.9 % IV SOLN
INTRAVENOUS | Status: DC
  Administered 2018-05-07: 09:00:00 via INTRAVENOUS

## 2018-05-07 MED ORDER — SODIUM CHLORIDE 0.9 % IV SOLN
300.0000 mg | INTRAVENOUS | Status: DC
  Administered 2018-05-07: 300 mg via INTRAVENOUS
  Filled 2018-05-07: qty 15

## 2018-05-21 DIAGNOSIS — Z0271 Encounter for disability determination: Secondary | ICD-10-CM

## 2018-06-03 ENCOUNTER — Ambulatory Visit: Payer: Medicaid Other | Admitting: Neurology

## 2018-06-03 ENCOUNTER — Telehealth: Payer: Self-pay | Admitting: *Deleted

## 2018-06-03 ENCOUNTER — Encounter: Payer: Self-pay | Admitting: Neurology

## 2018-06-03 VITALS — BP 123/73 | HR 78 | Ht 71.0 in | Wt 201.0 lb

## 2018-06-03 DIAGNOSIS — G8114 Spastic hemiplegia affecting left nondominant side: Secondary | ICD-10-CM | POA: Diagnosis not present

## 2018-06-03 DIAGNOSIS — R3911 Hesitancy of micturition: Secondary | ICD-10-CM

## 2018-06-03 DIAGNOSIS — R261 Paralytic gait: Secondary | ICD-10-CM | POA: Diagnosis not present

## 2018-06-03 DIAGNOSIS — G35 Multiple sclerosis: Secondary | ICD-10-CM | POA: Diagnosis not present

## 2018-06-03 DIAGNOSIS — Z79899 Other long term (current) drug therapy: Secondary | ICD-10-CM

## 2018-06-03 DIAGNOSIS — M5440 Lumbago with sciatica, unspecified side: Secondary | ICD-10-CM

## 2018-06-03 MED ORDER — DALFAMPRIDINE ER 10 MG PO TB12: 10.0000 mg | ORAL_TABLET | Freq: Two times a day (BID) | ORAL | 11 refills | Status: DC

## 2018-06-03 MED ORDER — DICLOFENAC SODIUM 75 MG PO TBEC: 75.0000 mg | DELAYED_RELEASE_TABLET | Freq: Two times a day (BID) | ORAL | 5 refills | Status: DC

## 2018-06-03 NOTE — Telephone Encounter (Signed)
Placed JCV lab in [facility] lock box for routine lab pick up.  

## 2018-06-03 NOTE — Telephone Encounter (Signed)
I have spoken with pt. this am. He believes he signed an [employer] srf, but [employer] [facility] [facility] is not able to locate one for him. He needs to sign one. He is not able to come back to the office today. I will mail the srf to his home address ([address]). He will sign and initial in the spots I have marked for him, and mail the form back to [place], or drop it by the office.  Once we get it back, we will send it in to [employer] [facility] and hopefully he will be able to get the 60 day free trial/fim

## 2018-06-03 NOTE — Progress Notes (Signed)
[name]  PATIENT: Bradley Brandt DOB: [birth year]  REFERRING DOCTOR OR PCP:  Thomes Dinning SOURCE: patient, records form Dr. Gaynelle Adu, MRI reports and images on PACS  _________________________________   HISTORICAL  CHIEF COMPLAINT:  Chief Complaint  Patient presents with  . Follow-up    RM 12, alone. Last seen 12/31/17. On Tysabri for MS. Last infusion: last month. Has infusion tomorrow (06/04/18)  . Back Pain    Having severe lower back pain. Ambulation makes pain worse. Cannot walk long distances. Cannot sit and get relief either.   . Gait Problem    Ambulates with cane.    HISTORY OF PRESENT ILLNESS:  Bradley Brandt is a 37 y.o.  man with multiple sclerosis.     Update 06/03/2018: He is on Tysabri and he tolerates it well.  He denies any new symptoms.   He has a poor gait due to right > left weakness and reduced balance.   He uses a cane.    He notes some numbness in the right leg.   He has lower back pain.  He takes gabapentin and baclofen with some benefit.   He is out of his NSAID.     Vision is stable.   He denies diplopia, vision loss or color loss.  He wears glasses.   He has some urinary urgency and occasionally has incontinence in the early morning while in bed.   He is on tamsulosin as he has hesitancy.  He is also on baclofen for muscle spasms.      He sleeps ok most nights.    Mood is doing well off fluoxetine.  He has some fatigue and his legs tire out easily.   He has some word finding difficulties and reduced focus/attention.   Update 12/31/2017: He is out of prison and back home.      He is on Tysabri and tolerates it well (infusions are at [facility])   He denies any recent exacerbation.  He uses a walker for his gait though will furniture surf some in the house.   He has fallen a few times.    His right leg is stronger than the left leg.     Arm strength is mildly reduced on th e left and coordination is poor in that arm.   He has  tingling dysesthesia in the left leg.   He feels his vision is worse but he did better with a new pair of glasses.       He has trouble emptying his bladder but has no incontinence.   Flomax has helped.     Mood is doing about the same.    He does better with Prozac for mood and Seroquel for anxiety and sleep.    Insomnia bothers him some nights.    He has fatigue. He feels cognition has been stable.      Update 08/02/2017: He is on Tysabri. He tolerates it well. His last JCV antibody was negative and 0.12.     He reports vision is worse on the right and his ophthalmologist told him to come back to see me.   Bradley Brandt states that he can't see well without his glasses but is seeing ok with them when he uses both eyes.[name]   His right eye worsened sometime over the past 3 months (started while still in prison).  He is uncertain if the vision worsened rapidly or over a longer time span. He feels his vision is  about the same now as a couple weeks ago.  He continues to have trouble walking and can go about 100 feet with a cane without stopping.    Leg strength is symmetric.  He has some spasticity.     He notes pins and needles dysesthesia in his legs.   Arms have no weakness or numbness.      Bladder hesitancy is better on Flomax but he has occasional incontinence at night.     Fatigue is moderate many days.     He feels mood is doing ok.   He used to see psychiatry.   Update 07/03/2017: He is on Tysabri and he tolerated it well.   He has not had any exacerbation recently.   He notes reduced gait and can walk abput 50-100 feet without stopping.    He feels both legs are weak and left = right.    He notes spasticity in both legs.   He notes a pins/needles tingling dysesthesia in both legs.   His arms are strong.    He has trouble with urinary hesitancy and often needs to push to empty.    He has not had any UTI.    Flomax has helped.        He notes increased fatigue but also has trouble falling asleep.         He has finished his jail term and is back home.    From 08/23/2016: He is currently on Tysabri. He tolerates it well and feels that his MS has done better on Tysabri than any other medication. He also notes that it helps his fatigue. He reports being JCV antibody negative  Gait/strength/sensation: He has difficulty with his gait and walk without a cane. He notes spasticity in both legs. He feels that there is chronic mild right leg weakness but that the left leg weakness got worse more recently.   He notes some tingling in the feet but does not feel that it is painful.   Baclofen has helped the spasticity.   Mirtazepine helped his sleep but he didn't feel he thought as clearly the next day and he prefers not to take.     Vision: He feels vision is doing ok with newer glasses. He has not noted changes in color vision. He notes no diplopia. .   Bladder: He notes increased urinary frequency but no incontinence.  Hesitancy is better on Flomax.     Fatigue/sleep: He notes fatigue and sleepiness in the afternoons that improved on Tysabri.  Sleeps well at night  Mood/cognition: He denies any depression or anxiety now but had some in the past.   He has been diagnosed with bipolar disease and is on a psychiatric medicine but does not know the name.. Notes some decreased focus and attention but notes no major cognitive problems.  MS history: Bradley Brandt was diagnosed around 2001 when he presented with right-sided numbness and clumsiness and weakness.   Initially, he was on Avonex and then Rebif but had relapses on both of them.  He could not tolerate Tecfidera.   He was started on Tysabri about 1 year ago and his last infusion was at [facility] at 11/08/2015.  He was seen Dr. Gaynelle Adu [facility] and those records were reviewed. He did receive a course of Solu-Medrol March 2015 and another course of Solu-Medrol January 2016 when he presented with left leg weakness and numbness  that was new    An MRI of  the brain 08/28/2013 personally reviewed shows old extensive white matter changes in both hemispheres with multiple sclerosis. There were new areas of restricted diffusion without definite enhancement that could be consistent with ongoing demyelination when compared to the MRI from 05/13/2013.   REVIEW OF SYSTEMS: Constitutional: No fevers, chills, sweats, or change in appetite.   Notes fatigue Eyes: No visual changes, double vision, eye pain Ear, nose and throat: No hearing loss, ear pain, nasal congestion, sore throat Cardiovascular: No chest pain, palpitations Respiratory: No shortness of breath at rest or with exertion.   No wheezes GastrointestinaI: No nausea, vomiting, diarrhea, abdominal pain, fecal incontinence Genitourinary: He has hesitancy and frequency.   No UTI's. Musculoskeletal: No neck pain, back pain Integumentary: No rash, pruritus, skin lesions Neurological: as above Psychiatric: No depression at this time.  No anxiety Endocrine: No palpitations, diaphoresis, change in appetite, change in weigh or increased thirst Hematologic/Lymphatic: No anemia, purpura, petechiae. Allergic/Immunologic: No itchy/runny eyes, nasal congestion, recent allergic reactions, rashes  ALLERGIES: Allergies  Allergen Reactions  . Lithium Rash    HOME MEDICATIONS:  Current Outpatient Medications:  .  acetaminophen (TYLENOL) 500 MG tablet, Take 500 mg by mouth 2 (two) times daily., Disp: , Rfl:  .  baclofen (LIORESAL) 20 MG tablet, Take 1 tablet (20 mg total) by mouth 3 (three) times daily., Disp: 90 tablet, Rfl: 11 .  dalfampridine 10 MG TB12, Take 1 tablet (10 mg total) by mouth every 12 (twelve) hours., Disp: 60 tablet, Rfl: 11 .  gabapentin (NEURONTIN) 600 MG tablet, Take 1 tablet (600 mg total) by mouth 3 (three) times daily., Disp: 90 tablet, Rfl: 11 .  natalizumab (TYSABRI) 300 MG/15ML injection, Inject 15 mLs (300 mg total) into the vein every 28  (twenty-eight) days., Disp: 15 mL, Rfl: 5 .  tamsulosin (FLOMAX) 0.4 MG CAPS capsule, Take 2 capsules (0.8 mg total) by mouth daily., Disp: 60 capsule, Rfl: 11 .  diclofenac (VOLTAREN) 75 MG EC tablet, Take 1 tablet (75 mg total) by mouth 2 (two) times daily., Disp: 60 tablet, Rfl: 5  PAST MEDICAL HISTORY: Past Medical History:  Diagnosis Date  . Bipolar 1 disorder (HCC)   . Bipolar disorder, unspecified (HCC) 01/03/2013  . MS (multiple sclerosis) (HCC) dx 2006   relapsing-remitting right sided weakness  . Neuromuscular disorder (HCC)    MS    PAST SURGICAL HISTORY: Past Surgical History:  Procedure Laterality Date  . TUMOR REMOVAL  1983   abdomen benign    FAMILY HISTORY: Family History  Adopted: Yes  Problem Relation Age of Onset  . Lupus Mother   . Ataxia Neg Hx   . Chorea Neg Hx   . Dementia Neg Hx   . Mental retardation Neg Hx   . Migraines Neg Hx   . Multiple sclerosis Neg Hx   . Neurofibromatosis Neg Hx   . Neuropathy Neg Hx   . Parkinsonism Neg Hx   . Seizures Neg Hx   . Stroke Neg Hx     SOCIAL HISTORY:  Social History   Socioeconomic History  . Marital status: Single    Spouse name: Not on file  . Number of children: Not on file  . Years of education: Not on file  . Highest education level: Not on file  Occupational History  . Not on file  Social Needs  . Financial resource strain: Not on file  . Food insecurity:    Worry: Not on file    Inability: Not on file  .  Transportation needs:    Medical: Not on file    Non-medical: Not on file  Tobacco Use  . Smoking status: Current Every Day Smoker    Packs/day: 0.50    Types: Cigarettes  . Smokeless tobacco: Never Used  . Tobacco comment: patient needs to quit   Substance and Sexual Activity  . Alcohol use: Yes    Alcohol/week: 2.0 standard drinks    Types: 2 Cans of beer per week    Comment:  two to three beers a week  . Drug use: Yes    Frequency: 7.0 times per week    Types: Marijuana  .  Sexual activity: Yes    Partners: Female  Lifestyle  . Physical activity:    Days per week: Not on file    Minutes per session: Not on file  . Stress: Not on file  Relationships  . Social connections:    Talks on phone: Not on file    Gets together: Not on file    Attends religious service: Not on file    Active member of club or organization: Not on file    Attends meetings of clubs or organizations: Not on file    Relationship status: Not on file  . Intimate partner violence:    Fear of current or ex partner: Not on file    Emotionally abused: Not on file    Physically abused: Not on file    Forced sexual activity: Not on file  Other Topics Concern  . Not on file  Social History Narrative  . Not on file     PHYSICAL EXAM  Vitals:   06/03/18 1002  BP: 123/73  Pulse: 78  Weight: 201 lb (91.2 kg)  Height: 5\' 11"  (1.803 m)    Body mass index is 28.03 kg/m.   General: The patient is well-developed and well-nourished and in no acute distress.   Neurologic Exam  Mental status: The patient is alert and oriented x 3 at the time of the examination. The patient has apparent normal recent and remote memory, with an apparently normal attention span and concentration ability.   Speech is normal.  Cranial nerves: Extraocular movements are full.   2+ right APD.     Reduced color vision on the right.     Facial strength and sensation is normal. Trapezius strength is strong.  The tongue is midline, and the patient has symmetric elevation of the soft palate. No obvious hearing deficits are noted.  Motor:  Muscle bulk is normal.   Left rapid altering movements are slow in the hand.  There is increased muscle tone in the left arm and both legs, left greater than right.  Strength is 5/5 in the right arm, 4+/5 in the left arm and 4- to 4/5 in the right leg and 4/5 in the left leg.  Sensory: He has reduced vibration sensation in the left leg.   Coordination: Cerebellar testing  reveals mildly reduced left finger-nose-finger and he is unable to do heel-to-shin bilaterally.  Gait and station: Station is normal.  Gait is wide and spastic.   He does better with his cane.  He cannot tandem.   Romberg is borderline  Reflexes: Deep tendon reflexes are increased with spread at the knees.  He has nonsustained clonus at the ankles         DIAGNOSTIC DATA (LABS, IMAGING, TESTING) - I reviewed patient records, labs, notes, testing and imaging myself where available.  Lab Results  Component  Value Date   WBC 8.2 12/31/2017   HGB 15.1 12/31/2017   HCT 45.7 12/31/2017   MCV 96 12/31/2017   PLT 193 12/31/2017      Component Value Date/Time   NA 141 12/31/2017 0909   K 4.4 12/31/2017 0909   CL 108 (H) 12/31/2017 0909   CO2 22 12/31/2017 0909   GLUCOSE 83 12/31/2017 0909   GLUCOSE 124 (H) 02/09/2014 1334   BUN 19 12/31/2017 0909   CREATININE 1.20 12/31/2017 0909   CALCIUM 9.3 12/31/2017 0909   PROT 6.7 12/16/2015 1341   ALBUMIN 4.3 12/16/2015 1341   AST 16 12/16/2015 1341   ALT 13 12/16/2015 1341   ALKPHOS 81 12/16/2015 1341   BILITOT 0.3 12/16/2015 1341   GFRNONAA 76 12/31/2017 0909   GFRAA 88 12/31/2017 0909       ASSESSMENT AND PLAN  Relapsing remitting multiple sclerosis (HCC) - Plan: Stratify JCV Antibody Test ([facility]), CBC with Differential/Platelet  Spastic hemiplegia of left nondominant side due to noncerebrovascular etiology (HCC)  Spastic gait  Urinary hesitancy  High risk medication use  Midline low back pain with sciatica, sciatica laterality unspecified, unspecified chronicity  1.   Continue Tysabri.  We will check a JCV antibody and CBC today. 2.   Ampyra 10 mg twice daily for gait. 3.   Continue gabapentin and add diclofenac for back pain. 4.   Continue tamsulosin for bladder.  it has helped hesitancy but he does have some morning incontinence.  If bladder function worsens we will refer to urology for further evaluation. 5.    Return in 6 months or sooner if new or worsening neurologic symptoms.   Pristine Gladhill A. Epimenio Foot, MD, PhD 06/03/2018, 10:53 AM Certified in Neurology, Clinical Neurophysiology, Sleep Medicine, Pain Medicine and Neuroimaging  [facility] [address], [address] [place], [place] [address] [phone]

## 2018-06-04 ENCOUNTER — Encounter (HOSPITAL_COMMUNITY): Payer: Self-pay

## 2018-06-04 ENCOUNTER — Encounter (HOSPITAL_COMMUNITY)
Admission: RE | Admit: 2018-06-04 | Discharge: 2018-06-04 | Disposition: A | Discharge status: Home / Self Care | Payer: Medicaid Other | Source: Ambulatory Visit | Attending: Neurology | Admitting: Neurology

## 2018-06-04 DIAGNOSIS — G35 Multiple sclerosis: Secondary | ICD-10-CM | POA: Diagnosis not present

## 2018-06-04 LAB — CBC WITH DIFFERENTIAL/PLATELET
BASOS: 1 %
Basophils Absolute: 0.1 10*3/uL (ref 0.0–0.2)
EOS (ABSOLUTE): 0.2 10*3/uL (ref 0.0–0.4)
EOS: 2 %
HEMATOCRIT: 43.6 % (ref 37.5–51.0)
HEMOGLOBIN: 14.6 g/dL (ref 13.0–17.7)
IMMATURE GRANULOCYTES: 1 %
Immature Grans (Abs): 0.1 10*3/uL (ref 0.0–0.1)
Lymphocytes Absolute: 4.2 10*3/uL — ABNORMAL HIGH (ref 0.7–3.1)
Lymphs: 40 %
MCH: 31.1 pg (ref 26.6–33.0)
MCHC: 33.5 g/dL (ref 31.5–35.7)
MCV: 93 fL (ref 79–97)
Monocytes Absolute: 1 10*3/uL — ABNORMAL HIGH (ref 0.1–0.9)
Monocytes: 9 %
NEUTROS PCT: 47 %
NRBC: 1 % — AB (ref 0–0)
Neutrophils Absolute: 5.2 10*3/uL (ref 1.4–7.0)
Platelets: 224 10*3/uL (ref 150–450)
RBC: 4.7 x10E6/uL (ref 4.14–5.80)
RDW: 13.7 % (ref 12.3–15.4)
WBC: 10.6 10*3/uL (ref 3.4–10.8)

## 2018-06-04 MED ORDER — SODIUM CHLORIDE 0.9 % IV SOLN
300.0000 mg | INTRAVENOUS | Status: DC
  Administered 2018-06-04: 300 mg via INTRAVENOUS
  Filled 2018-06-04: qty 15

## 2018-06-04 MED ORDER — SODIUM CHLORIDE 0.9 % IV SOLN
INTRAVENOUS | Status: DC
  Administered 2018-06-04: 09:00:00 via INTRAVENOUS

## 2018-06-04 NOTE — Progress Notes (Signed)
Successful Tysabri Infusion; pt did not stay the 1 hour post infusion Tysabri recommendation. Pt and his father both aware to notify MD office of any complications or issues.

## 2018-06-04 NOTE — Discharge Instructions (Signed)
Natalizumab injection/Tysabri Infusion  °What is this medicine? °NATALIZUMAB (na ta LIZ you mab) is used to treat relapsing multiple sclerosis. This drug is not a cure. It is also used to treat Crohn's disease. °This medicine may be used for other purposes; ask your health care provider or pharmacist if you have questions. °COMMON BRAND NAME(S): Tysabri °What should I tell my health care provider before I take this medicine? °They need to know if you have any of these conditions: °-immune system problems °-progressive multifocal leukoencephalopathy (PML) °-an unusual or allergic reaction to natalizumab, other medicines, foods, dyes, or preservatives °-pregnant or trying to get pregnant °-breast-feeding °How should I use this medicine? °This medicine is for infusion into a vein. It is given by a health care professional in a hospital or clinic setting. °A special MedGuide will be given to you by the pharmacist with each prescription and refill. Be sure to read this information carefully each time. °Talk to your pediatrician regarding the use of this medicine in children. This medicine is not approved for use in children. °Overdosage: If you think you have taken too much of this medicine contact a poison control center or emergency room at once. °NOTE: This medicine is only for you. Do not share this medicine with others. °What if I miss a dose? °It is important not to miss your dose. Call your doctor or health care professional if you are unable to keep an appointment. °What may interact with this medicine? °-azathioprine °-cyclosporine °-interferon °-6-mercaptopurine °-methotrexate °-steroid medicines like prednisone or cortisone °-TNF-alpha inhibitors like adalimumab, etanercept, and infliximab °-vaccines °This list may not describe all possible interactions. Give your health care provider a list of all the medicines, herbs, non-prescription drugs, or dietary supplements you use. Also tell them if you smoke, drink  alcohol, or use illegal drugs. Some items may interact with your medicine. °What should I watch for while using this medicine? °Your condition will be monitored carefully while you are receiving this medicine. Visit your doctor for regular check ups. Tell your doctor or healthcare professional if your symptoms do not start to get better or if they get worse. °Stay away from people who are sick. Call your doctor or health care professional for advice if you get a fever, chills or sore throat, or other symptoms of a cold or flu. Do not treat yourself. °In some patients, this medicine may cause a serious brain infection that may cause death. If you have any problems seeing, thinking, speaking, walking, or standing, tell your doctor right away. If you cannot reach your doctor, get urgent medical care. °What side effects may I notice from receiving this medicine? °Side effects that you should report to your doctor or health care professional as soon as possible: °-allergic reactions like skin rash, itching or hives, swelling of the face, lips, or tongue °-breathing problems °-changes in vision °-chest pain °-dark urine °-depression, feelings of sadness °-dizziness °-general ill feeling or flu-like symptoms °-irregular, missed, or painful menstrual periods °-light-colored stools °-loss of appetite, nausea °-muscle weakness °-problems with balance, talking, or walking °-right upper belly pain °-unusually weak or tired °-yellowing of the eyes or skin °Side effects that usually do not require medical attention (report to your doctor or health care professional if they continue or are bothersome): °-aches, pains °-headache °-stomach upset °-tiredness °This list may not describe all possible side effects. Call your doctor for medical advice about side effects. You may report side effects to FDA at 1-800-FDA-1088. °Where should   I keep my medicine? °This drug is given in a hospital or clinic and will not be stored at  home. °NOTE: This sheet is a summary. It may not cover all possible information. If you have questions about this medicine, talk to your doctor, pharmacist, or health care provider. °© 2018 Elsevier/Gold Standard (2008-07-25 13:33:21) ° °

## 2018-06-10 ENCOUNTER — Other Ambulatory Visit: Payer: Self-pay | Admitting: *Deleted

## 2018-06-10 MED ORDER — DALFAMPRIDINE ER 10 MG PO TB12: ORAL_TABLET | ORAL | 11 refills | Status: DC

## 2018-06-10 NOTE — Telephone Encounter (Signed)
I called pt. Advised he does not qualify for Ampyra free trial since he has Medicaid. Dalfampridine xr on formulary list for Medicaid. I will send to the pharmacy and it will require PA. We will try and get that done this week. He verbalized understanding and agreeable to this plan.

## 2018-06-10 NOTE — Telephone Encounter (Signed)
JCV drawn on 06/03/18 indeterminatem titer: 0.20. Inhibition assay: negative.

## 2018-06-13 NOTE — Telephone Encounter (Signed)
Received fax notification from [facility] that they received request for rx dalfampridine ER. They will contact our office is anything further needed.

## 2018-07-02 ENCOUNTER — Encounter (HOSPITAL_COMMUNITY)
Admission: RE | Admit: 2018-07-02 | Discharge: 2018-07-02 | Disposition: A | Discharge status: Home / Self Care | Payer: Medicaid Other | Source: Ambulatory Visit | Attending: Neurology | Admitting: Neurology

## 2018-07-02 ENCOUNTER — Encounter (HOSPITAL_COMMUNITY): Payer: Self-pay

## 2018-07-02 DIAGNOSIS — G35 Multiple sclerosis: Secondary | ICD-10-CM | POA: Insufficient documentation

## 2018-07-02 MED ORDER — SODIUM CHLORIDE 0.9 % IV SOLN
INTRAVENOUS | Status: AC
  Administered 2018-07-02: 12:00:00 via INTRAVENOUS

## 2018-07-02 MED ORDER — SODIUM CHLORIDE 0.9 % IV SOLN
300.0000 mg | INTRAVENOUS | Status: AC
  Administered 2018-07-02: 300 mg via INTRAVENOUS
  Filled 2018-07-02: qty 15

## 2018-07-02 NOTE — Discharge Instructions (Signed)
Natalizumab injection/Tysabri Infusion  What is this medicine? NATALIZUMAB (na ta LIZ you mab) is used to treat relapsing multiple sclerosis. This drug is not a cure. It is also used to treat Crohn's disease. This medicine may be used for other purposes; ask your health care provider or pharmacist if you have questions. COMMON BRAND NAME(S): Tysabri What should I tell my health care provider before I take this medicine? They need to know if you have any of these conditions: -immune system problems -progressive multifocal leukoencephalopathy (PML) -an unusual or allergic reaction to natalizumab, other medicines, foods, dyes, or preservatives -pregnant or trying to get pregnant -breast-feeding How should I use this medicine? This medicine is for infusion into a vein. It is given by a health care professional in a hospital or clinic setting. A special MedGuide will be given to you by the pharmacist with each prescription and refill. Be sure to read this information carefully each time. Talk to your pediatrician regarding the use of this medicine in children. This medicine is not approved for use in children. Overdosage: If you think you have taken too much of this medicine contact a poison control center or emergency room at once. NOTE: This medicine is only for you. Do not share this medicine with others. What if I miss a dose? It is important not to miss your dose. Call your doctor or health care professional if you are unable to keep an appointment. What may interact with this medicine? -azathioprine -cyclosporine -interferon -6-mercaptopurine -methotrexate -steroid medicines like prednisone or cortisone -TNF-alpha inhibitors like adalimumab, etanercept, and infliximab -vaccines This list may not describe all possible interactions. Give your health care provider a list of all the medicines, herbs, non-prescription drugs, or dietary supplements you use. Also tell them if you smoke, drink  alcohol, or use illegal drugs. Some items may interact with your medicine. What should I watch for while using this medicine? Your condition will be monitored carefully while you are receiving this medicine. Visit your doctor for regular check ups. Tell your doctor or healthcare professional if your symptoms do not start to get better or if they get worse. Stay away from people who are sick. Call your doctor or health care professional for advice if you get a fever, chills or sore throat, or other symptoms of a cold or flu. Do not treat yourself. In some patients, this medicine may cause a serious brain infection that may cause death. If you have any problems seeing, thinking, speaking, walking, or standing, tell your doctor right away. If you cannot reach your doctor, get urgent medical care. What side effects may I notice from receiving this medicine? Side effects that you should report to your doctor or health care professional as soon as possible: -allergic reactions like skin rash, itching or hives, swelling of the face, lips, or tongue -breathing problems -changes in vision -chest pain -dark urine -depression, feelings of sadness -dizziness -general ill feeling or flu-like symptoms -irregular, missed, or painful menstrual periods -light-colored stools -loss of appetite, nausea -muscle weakness -problems with balance, talking, or walking -right upper belly pain -unusually weak or tired -yellowing of the eyes or skin Side effects that usually do not require medical attention (report to your doctor or health care professional if they continue or are bothersome): -aches, pains -headache -stomach upset -tiredness This list may not describe all possible side effects. Call your doctor for medical advice about side effects. You may report side effects to FDA at 1-800-FDA-1088. Where should  I keep my medicine? This drug is given in a hospital or clinic and will not be stored at  home. NOTE: This sheet is a summary. It may not cover all possible information. If you have questions about this medicine, talk to your doctor, pharmacist, or health care provider.  2019 Elsevier/Gold Standard (2008-07-25 13:33:21)

## 2018-07-02 NOTE — Progress Notes (Addendum)
Pt stayed approx 5 mins of 1 hour Touch recommendation for the Tysabri Infusion. Pt and his father both verbalized understanding to notify MD of any complications or issues. Pt had noneventful infusion.

## 2018-07-10 ENCOUNTER — Telehealth: Payer: Self-pay | Admitting: Neurology

## 2018-07-10 NOTE — Telephone Encounter (Signed)
Spoke with pt.  He reports n/v all day yesterday, onset about . after taking first dose of Ampyra.  He did not take the 2nd dose last night.  He felt better this am, so tried taking Ampyra and has done well. No n/v.  I explained it is difficult to say if n/v was due to Ampyra.  It could have been. Could have been a stomach bug as well.  Since he is doing well with Ampyra today, he can continue it.  Take pm dose tonight, since Ampyra is bid dosing.  If he has any more n/v after taking, call back.  He verbalized understanding of same/fim

## 2018-07-10 NOTE — Telephone Encounter (Signed)
Patient's father Bradley Brandt called with patient also on the line. He took meds Dalfampridine yesterday for the first time & got sick with vomiting. He took it today he did not get sick. Please call patient to discussking

## 2018-07-22 ENCOUNTER — Telehealth: Payer: Self-pay | Admitting: *Deleted

## 2018-07-22 NOTE — Telephone Encounter (Signed)
Received fax notification from touch prescribing program that pt re-authorized from 07/22/18-02/19/19. Pt enrollment #: [id number]. Account [facility]. Site auth#: [id number].

## 2018-07-22 NOTE — Telephone Encounter (Signed)
Faxed completed/signed Tysabri pt status report and reauth questionnaire to [facility] touch at [phone]. Received confirmation.  

## 2018-07-23 ENCOUNTER — Telehealth: Payer: Self-pay | Admitting: *Deleted

## 2018-07-23 NOTE — Telephone Encounter (Signed)
Faxed new Tysabri orders to [facility]): Tysabri 300mg /49ml IV q 28 days. Fax: [phone]. Received fax confirmation. Sent staff message to [facility] to inform her new orders faxed.

## 2018-07-30 ENCOUNTER — Encounter (HOSPITAL_COMMUNITY)
Admission: RE | Admit: 2018-07-30 | Discharge: 2018-07-30 | Disposition: A | Discharge status: Home / Self Care | Payer: Medicaid Other | Source: Ambulatory Visit | Attending: Neurology | Admitting: Neurology

## 2018-07-30 ENCOUNTER — Encounter (HOSPITAL_COMMUNITY): Payer: Self-pay

## 2018-07-30 DIAGNOSIS — G35 Multiple sclerosis: Secondary | ICD-10-CM | POA: Diagnosis not present

## 2018-07-30 MED ORDER — NATALIZUMAB 300 MG/15ML IV CONC
300.0000 mg | INTRAVENOUS | Status: DC
Start: 2018-07-30 — End: 2018-07-31
  Administered 2018-07-30: 300 mg via INTRAVENOUS
  Filled 2018-07-30: qty 15

## 2018-07-30 MED ORDER — SODIUM CHLORIDE 0.9 % IV SOLN
INTRAVENOUS | Status: DC
  Administered 2018-07-30: 08:00:00 via INTRAVENOUS

## 2018-08-27 ENCOUNTER — Encounter (HOSPITAL_COMMUNITY): Payer: Self-pay

## 2018-08-27 ENCOUNTER — Encounter (HOSPITAL_COMMUNITY)
Admission: RE | Admit: 2018-08-27 | Discharge: 2018-08-27 | Disposition: A | Discharge status: Home / Self Care | Payer: Medicaid Other | Source: Ambulatory Visit | Attending: Neurology | Admitting: Neurology

## 2018-08-27 DIAGNOSIS — G35 Multiple sclerosis: Secondary | ICD-10-CM | POA: Insufficient documentation

## 2018-08-27 MED ORDER — SODIUM CHLORIDE 0.9 % IV SOLN
300.0000 mg | INTRAVENOUS | Status: DC
  Administered 2018-08-27: 300 mg via INTRAVENOUS
  Filled 2018-08-27: qty 15

## 2018-08-27 MED ORDER — SODIUM CHLORIDE 0.9 % IV SOLN
INTRAVENOUS | Status: DC
  Administered 2018-08-27: 12:00:00 via INTRAVENOUS

## 2018-09-24 ENCOUNTER — Other Ambulatory Visit: Payer: Self-pay

## 2018-09-24 ENCOUNTER — Encounter (HOSPITAL_COMMUNITY)
Admission: RE | Admit: 2018-09-24 | Discharge: 2018-09-24 | Disposition: A | Discharge status: Home / Self Care | Payer: Medicaid Other | Source: Ambulatory Visit | Attending: Neurology | Admitting: Neurology

## 2018-09-24 ENCOUNTER — Encounter (HOSPITAL_COMMUNITY): Payer: Self-pay

## 2018-09-24 DIAGNOSIS — G35 Multiple sclerosis: Secondary | ICD-10-CM | POA: Diagnosis not present

## 2018-09-24 MED ORDER — SODIUM CHLORIDE 0.9 % IV SOLN
300.0000 mg | INTRAVENOUS | Status: DC
  Administered 2018-09-24: 300 mg via INTRAVENOUS
  Filled 2018-09-24: qty 15

## 2018-09-24 MED ORDER — SODIUM CHLORIDE 0.9 % IV SOLN
INTRAVENOUS | Status: DC
  Administered 2018-09-24: 09:00:00 via INTRAVENOUS

## 2018-10-02 ENCOUNTER — Other Ambulatory Visit (HOSPITAL_COMMUNITY): Payer: Self-pay | Admitting: General Practice

## 2018-10-02 ENCOUNTER — Telehealth: Payer: Self-pay | Admitting: *Deleted

## 2018-10-02 NOTE — Telephone Encounter (Addendum)
Received fax notification from touch prescribing program that pt authorized from 08/21/18-02/19/19. Enrollment number: [id number]. Account: [facility]. Site auth number: [id number].

## 2018-10-22 ENCOUNTER — Other Ambulatory Visit: Payer: Self-pay

## 2018-10-22 ENCOUNTER — Encounter (HOSPITAL_COMMUNITY): Payer: Medicaid Other

## 2018-10-22 ENCOUNTER — Ambulatory Visit (HOSPITAL_COMMUNITY)
Admission: RE | Admit: 2018-10-22 | Discharge: 2018-10-22 | Disposition: A | Discharge status: Home / Self Care | Payer: Medicaid Other | Source: Ambulatory Visit | Attending: Neurology | Admitting: Neurology

## 2018-10-22 DIAGNOSIS — G35 Multiple sclerosis: Secondary | ICD-10-CM | POA: Diagnosis not present

## 2018-10-22 MED ORDER — SODIUM CHLORIDE 0.9 % IV SOLN
300.0000 mg | INTRAVENOUS | Status: DC
  Administered 2018-10-22: 300 mg via INTRAVENOUS
  Filled 2018-10-22: qty 15

## 2018-10-22 MED ORDER — SODIUM CHLORIDE 0.9 % IV SOLN
INTRAVENOUS | Status: DC
  Administered 2018-10-22: 12:00:00 via INTRAVENOUS

## 2018-10-22 NOTE — Discharge Instructions (Signed)
Natalizumab injection What is this medicine? NATALIZUMAB (na ta LIZ you mab) is used to treat relapsing multiple sclerosis. This drug is not a cure. It is also used to treat Crohn's disease. This medicine may be used for other purposes; ask your health care provider or pharmacist if you have questions. COMMON BRAND NAME(S): Tysabri What should I tell my health care provider before I take this medicine? They need to know if you have any of these conditions: -immune system problems -progressive multifocal leukoencephalopathy (PML) -an unusual or allergic reaction to natalizumab, other medicines, foods, dyes, or preservatives -pregnant or trying to get pregnant -breast-feeding How should I use this medicine? This medicine is for infusion into a vein. It is given by a health care professional in a hospital or clinic setting. A special MedGuide will be given to you by the pharmacist with each prescription and refill. Be sure to read this information carefully each time. Talk to your pediatrician regarding the use of this medicine in children. This medicine is not approved for use in children. Overdosage: If you think you have taken too much of this medicine contact a poison control center or emergency room at once. NOTE: This medicine is only for you. Do not share this medicine with others. What if I miss a dose? It is important not to miss your dose. Call your doctor or health care professional if you are unable to keep an appointment. What may interact with this medicine? -azathioprine -cyclosporine -interferon -6-mercaptopurine -methotrexate -steroid medicines like prednisone or cortisone -TNF-alpha inhibitors like adalimumab, etanercept, and infliximab -vaccines This list may not describe all possible interactions. Give your health care provider a list of all the medicines, herbs, non-prescription drugs, or dietary supplements you use. Also tell them if you smoke, drink alcohol, or use  illegal drugs. Some items may interact with your medicine. What should I watch for while using this medicine? Your condition will be monitored carefully while you are receiving this medicine. Visit your doctor for regular check ups. Tell your doctor or healthcare professional if your symptoms do not start to get better or if they get worse. Stay away from people who are sick. Call your doctor or health care professional for advice if you get a fever, chills or sore throat, or other symptoms of a cold or flu. Do not treat yourself. In some patients, this medicine may cause a serious brain infection that may cause death. If you have any problems seeing, thinking, speaking, walking, or standing, tell your doctor right away. If you cannot reach your doctor, get urgent medical care. What side effects may I notice from receiving this medicine? Side effects that you should report to your doctor or health care professional as soon as possible: -allergic reactions like skin rash, itching or hives, swelling of the face, lips, or tongue -breathing problems -changes in vision -chest pain -dark urine -depression, feelings of sadness -dizziness -general ill feeling or flu-like symptoms -irregular, missed, or painful menstrual periods -light-colored stools -loss of appetite, nausea -muscle weakness -problems with balance, talking, or walking -right upper belly pain -unusually weak or tired -yellowing of the eyes or skin Side effects that usually do not require medical attention (report to your doctor or health care professional if they continue or are bothersome): -aches, pains -headache -stomach upset -tiredness This list may not describe all possible side effects. Call your doctor for medical advice about side effects. You may report side effects to FDA at 1-800-FDA-1088. Where should I keep   my medicine? This drug is given in a hospital or clinic and will not be stored at home. NOTE: This sheet is  a summary. It may not cover all possible information. If you have questions about this medicine, talk to your doctor, pharmacist, or health care provider.  2019 Elsevier/Gold Standard (2008-07-25 13:33:21)  

## 2018-10-22 NOTE — Progress Notes (Signed)
PATIENT CARE CENTER NOTE  Diagnosis:Multiple Sclerosis   Provider:Sater, Richard, MD   Procedure:Tysabri IV   Note:Patient received Tysabri infusion. Tolerated infusion well with no adverse reaction. Vital signs stable. Discharge instructions given. Patient did not want to stay the 1 hour post-infusion. Alert, oriented and ambulatory at discharge. 

## 2018-11-19 ENCOUNTER — Encounter (HOSPITAL_COMMUNITY): Payer: Medicaid Other

## 2018-11-19 ENCOUNTER — Ambulatory Visit (HOSPITAL_COMMUNITY)
Admission: RE | Admit: 2018-11-19 | Discharge: 2018-11-19 | Disposition: A | Discharge status: Home / Self Care | Payer: Medicaid Other | Source: Ambulatory Visit | Attending: Neurology | Admitting: Neurology

## 2018-11-19 ENCOUNTER — Other Ambulatory Visit: Payer: Self-pay

## 2018-11-19 DIAGNOSIS — G35 Multiple sclerosis: Secondary | ICD-10-CM | POA: Insufficient documentation

## 2018-11-19 MED ORDER — SODIUM CHLORIDE 0.9 % IV SOLN
INTRAVENOUS | Status: DC | PRN
  Administered 2018-11-19: 250 mL via INTRAVENOUS

## 2018-11-19 MED ORDER — SODIUM CHLORIDE 0.9 % IV SOLN
300.0000 mg | INTRAVENOUS | Status: DC
  Administered 2018-11-19: 300 mg via INTRAVENOUS
  Filled 2018-11-19: qty 15

## 2018-11-19 NOTE — Progress Notes (Signed)
PATIENT CARE CENTER NOTE  Diagnosis:Multiple Sclerosis   Provider:Sater, Richard, MD   Procedure:Tysabri IV   Note:Patient received Tysabri infusion. Tolerated infusion well with no adverse reaction. Vital signs stable. Discharge instructions given. Patient did not want to stay the 1 hour post-infusion. Alert, oriented and ambulatory at discharge. 

## 2018-11-19 NOTE — Discharge Instructions (Signed)
Natalizumab injection What is this medicine? NATALIZUMAB (na ta LIZ you mab) is used to treat relapsing multiple sclerosis. This drug is not a cure. It is also used to treat Crohn's disease. This medicine may be used for other purposes; ask your health care provider or pharmacist if you have questions. COMMON BRAND NAME(S): Tysabri What should I tell my health care provider before I take this medicine? They need to know if you have any of these conditions: -immune system problems -progressive multifocal leukoencephalopathy (PML) -an unusual or allergic reaction to natalizumab, other medicines, foods, dyes, or preservatives -pregnant or trying to get pregnant -breast-feeding How should I use this medicine? This medicine is for infusion into a vein. It is given by a health care professional in a hospital or clinic setting. A special MedGuide will be given to you by the pharmacist with each prescription and refill. Be sure to read this information carefully each time. Talk to your pediatrician regarding the use of this medicine in children. This medicine is not approved for use in children. Overdosage: If you think you have taken too much of this medicine contact a poison control center or emergency room at once. NOTE: This medicine is only for you. Do not share this medicine with others. What if I miss a dose? It is important not to miss your dose. Call your doctor or health care professional if you are unable to keep an appointment. What may interact with this medicine? -azathioprine -cyclosporine -interferon -6-mercaptopurine -methotrexate -steroid medicines like prednisone or cortisone -TNF-alpha inhibitors like adalimumab, etanercept, and infliximab -vaccines This list may not describe all possible interactions. Give your health care provider a list of all the medicines, herbs, non-prescription drugs, or dietary supplements you use. Also tell them if you smoke, drink alcohol, or use  illegal drugs. Some items may interact with your medicine. What should I watch for while using this medicine? Your condition will be monitored carefully while you are receiving this medicine. Visit your doctor for regular check ups. Tell your doctor or healthcare professional if your symptoms do not start to get better or if they get worse. Stay away from people who are sick. Call your doctor or health care professional for advice if you get a fever, chills or sore throat, or other symptoms of a cold or flu. Do not treat yourself. In some patients, this medicine may cause a serious brain infection that may cause death. If you have any problems seeing, thinking, speaking, walking, or standing, tell your doctor right away. If you cannot reach your doctor, get urgent medical care. What side effects may I notice from receiving this medicine? Side effects that you should report to your doctor or health care professional as soon as possible: -allergic reactions like skin rash, itching or hives, swelling of the face, lips, or tongue -breathing problems -changes in vision -chest pain -dark urine -depression, feelings of sadness -dizziness -general ill feeling or flu-like symptoms -irregular, missed, or painful menstrual periods -light-colored stools -loss of appetite, nausea -muscle weakness -problems with balance, talking, or walking -right upper belly pain -unusually weak or tired -yellowing of the eyes or skin Side effects that usually do not require medical attention (report to your doctor or health care professional if they continue or are bothersome): -aches, pains -headache -stomach upset -tiredness This list may not describe all possible side effects. Call your doctor for medical advice about side effects. You may report side effects to FDA at 1-800-FDA-1088. Where should I keep   my medicine? This drug is given in a hospital or clinic and will not be stored at home. NOTE: This sheet is  a summary. It may not cover all possible information. If you have questions about this medicine, talk to your doctor, pharmacist, or health care provider.  2019 Elsevier/Gold Standard (2008-07-25 13:33:21)  

## 2018-11-22 ENCOUNTER — Encounter (HOSPITAL_COMMUNITY): Payer: Medicaid Other

## 2018-11-28 ENCOUNTER — Telehealth: Payer: Self-pay | Admitting: *Deleted

## 2018-11-28 NOTE — Telephone Encounter (Signed)
Called pt. He consented to virtual visit with Dr. Felecia Shelling. He could not get signed up for mychart.  He agreed to [email or web address]. I emailed link [email or web address] Confirmed he received. Updated med list, pharmacy, allergies.   Last infusion: 11/19/18 Next infusion: 12/19/18

## 2018-12-03 ENCOUNTER — Encounter: Payer: Self-pay | Admitting: Neurology

## 2018-12-03 ENCOUNTER — Telehealth: Payer: Self-pay | Admitting: *Deleted

## 2018-12-03 ENCOUNTER — Telehealth: Payer: Self-pay | Admitting: Neurology

## 2018-12-03 ENCOUNTER — Other Ambulatory Visit: Payer: Self-pay

## 2018-12-03 ENCOUNTER — Ambulatory Visit (INDEPENDENT_AMBULATORY_CARE_PROVIDER_SITE_OTHER): Payer: Medicaid Other | Admitting: Neurology

## 2018-12-03 DIAGNOSIS — G8191 Hemiplegia, unspecified affecting right dominant side: Secondary | ICD-10-CM | POA: Diagnosis not present

## 2018-12-03 DIAGNOSIS — G35 Multiple sclerosis: Secondary | ICD-10-CM

## 2018-12-03 DIAGNOSIS — Z79899 Other long term (current) drug therapy: Secondary | ICD-10-CM | POA: Diagnosis not present

## 2018-12-03 DIAGNOSIS — R3911 Hesitancy of micturition: Secondary | ICD-10-CM

## 2018-12-03 DIAGNOSIS — R200 Anesthesia of skin: Secondary | ICD-10-CM | POA: Diagnosis not present

## 2018-12-03 DIAGNOSIS — R261 Paralytic gait: Secondary | ICD-10-CM

## 2018-12-03 MED ORDER — DICLOFENAC SODIUM 75 MG PO TBEC: 75.0000 mg | DELAYED_RELEASE_TABLET | Freq: Two times a day (BID) | ORAL | 5 refills | Status: DC

## 2018-12-03 MED ORDER — DALFAMPRIDINE ER 10 MG PO TB12: ORAL_TABLET | ORAL | 11 refills | Status: DC

## 2018-12-03 MED ORDER — GABAPENTIN 600 MG PO TABS: 600.0000 mg | ORAL_TABLET | Freq: Three times a day (TID) | ORAL | 11 refills | Status: DC

## 2018-12-03 MED ORDER — TAMSULOSIN HCL 0.4 MG PO CAPS: 0.8000 mg | ORAL_CAPSULE | Freq: Every day | ORAL | 11 refills | Status: DC

## 2018-12-03 MED ORDER — BACLOFEN 20 MG PO TABS: 20.0000 mg | ORAL_TABLET | Freq: Three times a day (TID) | ORAL | 11 refills | Status: DC

## 2018-12-03 NOTE — Progress Notes (Signed)
[name]  PATIENT: Bradley Brandt DOB: 08-19-1980  REFERRING DOCTOR OR PCP:  Thomes DinningWayland KcKenzie SOURCE: patient, records form Dr. Gaynelle AduAbou Zeid, MRI reports and images on PACS  _________________________________   HISTORICAL  CHIEF COMPLAINT:  Chief Complaint  Patient presents with   Multiple Sclerosis    on Tysabri    HISTORY OF PRESENT ILLNESS:  Bradley Brandt is a 38 y.o.  man with multiple sclerosis.     Update 12/03/2018: Virtual Visit via Telephone Note  I connected with Bradley Brandt on 12/03/18 at 11:30 AM EDT by telephone and verified that I am speaking with the correct person using two identifiers.  I discussed the limitations, risks, security and privacy concerns of performing an evaluation and management service by telephone and the availability of in person appointments. I also discussed with the patient that there may be a patient responsible charge related to this service. The patient expressed understanding and agreed to proceed.  Location: Patient: home Provider: office  History of Present Illness: He is on Tysabri as his disease modifying therapy.  He tolerates it well.  He has been JCV antibody negative.  His last test was about 8 months ago.  He has not had any exacerbations on the medication.  He does not note any worsening of any of his symptoms.  His main issue continues to be a reduced gait.  He has right much greater than left spasticity and weakness.  Baclofen helps the spasticity some.  This involves his leg more than the arm.  He uses a cane.  He denies any recent falls.  He stumbles some.  Dalfampridine has helped the gait.  He also has some dysesthetic pain in the right leg and some back pain.  Gabapentin has helped.  He has urinary hesitancy with some benefit from tamsulosin.  He denies any difficulties with his vision.  He notes some fatigue but feels it is not too troubling.  He has some insomnia but generally sleeps well most  nights.  He denies any change with cognition.  He has some word finding difficulties at times and reduced focus and attention..   Mood has done well.  Observations/Objective:   Assessment and Plan: 1. Multiple sclerosis (HCC)   2. High risk medication use   3. Right hemiparesis (HCC)   4. Numbness   5. Spastic gait   6. Urinary hesitancy    1.   Continue Tysabri.  We will check a JCV antibody and CBC ordered for place and he will stop by the office sometime in the next week or two to get the lab work done.  Check MRI of the brain to determine if there is any subclinical progression of the MS.  If present, consider a different disease modifying therapy. 2.   Renew Ampyra 10 mg twice daily for gait.  Continue baclofen for spasticity 3.   Continue gabapentin for dysesthesia and back pain. 4.   Continue tamsulosin for bladder.    If bladder function worsens we will refer to urology for further evaluation. 5.   Return in 6 months or sooner if new or worsening neurologic symptoms.   Follow Up Instructions: I discussed the assessment and treatment plan with the patient. The patient was provided an opportunity to ask questions and all were answered. The patient agreed with the plan and demonstrated an understanding of the instructions.    The patient was advised to call back or seek an in-person evaluation if the symptoms  worsen or if the condition fails to improve as anticipated.  I provided 22 minutes of non-face-to-face time during this encounter.  ____________________________________________________________ From previous visits: Update 06/03/2018: He is on Tysabri and he tolerates it well.  He denies any new symptoms.   He has a poor gait due to right > left weakness and reduced balance.   He uses a cane.    He notes some numbness in the right leg.   He has lower back pain.  He takes gabapentin and baclofen with some benefit.   He is out of his NSAID.     Vision is stable.   He denies  diplopia, vision loss or color loss.  He wears glasses.   He has some urinary urgency and occasionally has incontinence in the early morning while in bed.   He is on tamsulosin as he has hesitancy.  He is also on baclofen for muscle spasms.      He sleeps ok most nights.    Mood is doing well off fluoxetine.  He has some fatigue and his legs tire out easily.   He has some word finding difficulties and reduced focus/attention.   Update 12/31/2017: He is out of prison and back home.      He is on Tysabri and tolerates it well [facility]   He denies any recent exacerbation.  He uses a walker for his gait though will furniture surf some in the house.   He has fallen a few times.    His right leg is stronger than the left leg.     Arm strength is mildly reduced on th e left and coordination is poor in that arm.   He has tingling dysesthesia in the left leg.   He feels his vision is worse but he did better with a new pair of glasses.       He has trouble emptying his bladder but has no incontinence.   Flomax has helped.     Mood is doing about the same.    He does better with Prozac for mood and Seroquel for anxiety and sleep.    Insomnia bothers him some nights.    He has fatigue. He feels cognition has been stable.      Update 08/02/2017: He is on Tysabri. He tolerates it well. His last JCV antibody was negative and 0.12.     He reports vision is worse on the right and his ophthalmologist told him to come back to see me.   [name] states that he can't see well without his glasses but is seeing ok with them when he uses both eyes.[name].   His right eye worsened sometime over the past 3 months (started while still in prison).  He is uncertain if the vision worsened rapidly or over a longer time span. He feels his vision is about the same now as a couple weeks ago.  He continues to have trouble walking and can go about 100 feet with a cane without stopping.    Leg strength is symmetric.   He has some spasticity.     He notes pins and needles dysesthesia in his legs.   Arms have no weakness or numbness.      Bladder hesitancy is better on Flomax but he has occasional incontinence at night.     Fatigue is moderate many days.     He feels mood is doing ok.   He used to see psychiatry.  Update 07/03/2017: He is on Tysabri and he tolerated it well.   He has not had any exacerbation recently.   He notes reduced gait and can walk abput 50-100 feet without stopping.    He feels both legs are weak and left = right.    He notes spasticity in both legs.   He notes a pins/needles tingling dysesthesia in both legs.   His arms are strong.    He has trouble with urinary hesitancy and often needs to push to empty.    He has not had any UTI.    Flomax has helped.        He notes increased fatigue but also has trouble falling asleep.        He has finished his jail term and is back home.    From 08/23/2016: He is currently on Tysabri. He tolerates it well and feels that his MS has done better on Tysabri than any other medication. He also notes that it helps his fatigue. He reports being JCV antibody negative  Gait/strength/sensation: He has difficulty with his gait and walk without a cane. He notes spasticity in both legs. He feels that there is chronic mild right leg weakness but that the left leg weakness got worse more recently.   He notes some tingling in the feet but does not feel that it is painful.   Baclofen has helped the spasticity.   Mirtazepine helped his sleep but he didn't feel he thought as clearly the next day and he prefers not to take.     Vision: He feels vision is doing ok with newer glasses. He has not noted changes in color vision. He notes no diplopia. .   Bladder: He notes increased urinary frequency but no incontinence.  Hesitancy is better on Flomax.     Fatigue/sleep: He notes fatigue and sleepiness in the afternoons that improved on Tysabri.  Sleeps well at  night  Mood/cognition: He denies any depression or anxiety now but had some in the past.   He has been diagnosed with bipolar disease and is on a psychiatric medicine but does not know the name.. Notes some decreased focus and attention but notes no major cognitive problems.  MS history: Oddie was diagnosed around 2001 when he presented with right-sided numbness and clumsiness and weakness.   Initially, he was on Avonex and then Rebif but had relapses on both of them.  He could not tolerate Tecfidera.   He was started on Tysabri about 1 year ago and his last infusion was at [facility] at 11/08/2015.  He was seen Dr. George [facility] and those records were reviewed. He did receive a course of Solu-Medrol March 2015 and another course of Solu-Medrol January 2016 when he presented with left leg weakness and numbness that was new    An MRI of the brain 08/28/2013 personally reviewed shows old extensive white matter changes in both hemispheres with multiple sclerosis. There were new areas of restricted diffusion without definite enhancement that could be consistent with ongoing demyelination when compared to the MRI from 05/13/2013.   REVIEW OF SYSTEMS: Constitutional: No fevers, chills, sweats, or change in appetite.   Notes fatigue Eyes: No visual changes, double vision, eye pain Ear, nose and throat: No hearing loss, ear pain, nasal congestion, sore throat Cardiovascular: No chest pain, palpitations Respiratory: No shortness of breath at rest or with exertion.   No wheezes GastrointestinaI: No nausea, vomiting, diarrhea, abdominal pain, fecal  incontinence Genitourinary: He has hesitancy and frequency.   No UTI's. Musculoskeletal: No neck pain, back pain Integumentary: No rash, pruritus, skin lesions Neurological: as above Psychiatric: No depression at this time.  No anxiety Endocrine: No palpitations, diaphoresis, change in appetite, change in weigh or increased  thirst Hematologic/Lymphatic: No anemia, purpura, petechiae. Allergic/Immunologic: No itchy/runny eyes, nasal congestion, recent allergic reactions, rashes  ALLERGIES: Allergies  Allergen Reactions   Lithium Rash    HOME MEDICATIONS:  Current Outpatient Medications:    acetaminophen (TYLENOL) 500 MG tablet, Take 500 mg by mouth 2 (two) times daily., Disp: , Rfl:    baclofen (LIORESAL) 20 MG tablet, Take 1 tablet (20 mg total) by mouth 3 (three) times daily., Disp: 90 tablet, Rfl: 11   dalfampridine 10 MG TB12, Take 1 tablet by mouth every 12 hours., Disp: 60 tablet, Rfl: 11   diclofenac (VOLTAREN) 75 MG EC tablet, Take 1 tablet (75 mg total) by mouth 2 (two) times daily., Disp: 60 tablet, Rfl: 5   gabapentin (NEURONTIN) 600 MG tablet, Take 1 tablet (600 mg total) by mouth 3 (three) times daily., Disp: 90 tablet, Rfl: 11   natalizumab (TYSABRI) 300 MG/15ML injection, Inject 15 mLs (300 mg total) into the vein every 28 (twenty-eight) days., Disp: 15 mL, Rfl: 5   tamsulosin (FLOMAX) 0.4 MG CAPS capsule, Take 2 capsules (0.8 mg total) by mouth daily., Disp: 60 capsule, Rfl: 11  PAST MEDICAL HISTORY: Past Medical History:  Diagnosis Date   Bipolar 1 disorder (HCC)    Bipolar disorder, unspecified (HCC) 01/03/2013   MS (multiple sclerosis) (HCC) dx 2006   relapsing-remitting right sided weakness   Neuromuscular disorder (HCC)    MS    PAST SURGICAL HISTORY: Past Surgical History:  Procedure Laterality Date   TUMOR REMOVAL  1983   abdomen benign    FAMILY HISTORY: Family History  Adopted: Yes  Problem Relation Age of Onset   Lupus Mother    Ataxia Neg Hx    Chorea Neg Hx    Dementia Neg Hx    Mental retardation Neg Hx    Migraines Neg Hx    Multiple sclerosis Neg Hx    Neurofibromatosis Neg Hx    Neuropathy Neg Hx    Parkinsonism Neg Hx    Seizures Neg Hx    Stroke Neg Hx     SOCIAL HISTORY:  Social History   Socioeconomic History    Marital status: Single    Spouse name: Not on file   Number of children: Not on file   Years of education: Not on file   Highest education level: Not on file  Occupational History   Not on file  Social Needs   Financial resource strain: Not on file   Food insecurity    Worry: Not on file    Inability: Not on file   Transportation needs    Medical: Not on file    Non-medical: Not on file  Tobacco Use   Smoking status: Current Every Day Smoker    Packs/day: 0.50    Types: Cigarettes   Smokeless tobacco: Never Used   Tobacco comment: patient needs to quit   Substance and Sexual Activity   Alcohol use: Yes    Alcohol/week: 2.0 standard drinks    Types: 2 Cans of beer per week    Comment:  two to three beers a week   Drug use: Yes    Frequency: 7.0 times per week    Types: Marijuana  Sexual activity: Yes    Partners: Female  Lifestyle   Physical activity    Days per week: Not on file    Minutes per session: Not on file   Stress: Not on file  Relationships   Social connections    Talks on phone: Not on file    Gets together: Not on file    Attends religious service: Not on file    Active member of club or organization: Not on file    Attends meetings of clubs or organizations: Not on file    Relationship status: Not on file   Intimate partner violence    Fear of current or ex partner: Not on file    Emotionally abused: Not on file    Physically abused: Not on file    Forced sexual activity: Not on file  Other Topics Concern   Not on file  Social History Narrative   Not on file     PHYSICAL EXAM  There were no vitals filed for this visit.  There is no height or weight on file to calculate BMI.   General: The patient is well-developed and well-nourished and in no acute distress.   Neurologic Exam  Mental status: The patient is alert and oriented x 3 at the time of the examination. The patient has apparent normal recent and remote memory,  with an apparently normal attention span and concentration ability.   Speech is normal.  Cranial nerves: Extraocular movements are full.   2+ right APD.     Reduced color vision on the right.     Facial strength and sensation is normal. Trapezius strength is strong.  The tongue is midline, and the patient has symmetric elevation of the soft palate. No obvious hearing deficits are noted.  Motor:  Muscle bulk is normal.   Left rapid altering movements are slow in the hand.  There is increased muscle tone in the left arm and both legs, left greater than right.  Strength is 5/5 in the right arm, 4+/5 in the left arm and 4- to 4/5 in the right leg and 4/5 in the left leg.  Sensory: He has reduced vibration sensation in the left leg.   Coordination: Cerebellar testing reveals mildly reduced left finger-nose-finger and he is unable to do heel-to-shin bilaterally.  Gait and station: Station is normal.  Gait is wide and spastic.   He does better with his cane.  He cannot tandem.   Romberg is borderline  Reflexes: Deep tendon reflexes are increased with spread at the knees.  He has nonsustained clonus at the ankles        Yona Kosek A. Epimenio Foot, MD, PhD 12/03/2018, 9:02 PM Certified in Neurology, Clinical Neurophysiology, Sleep Medicine, Pain Medicine and Neuroimaging  [facility] [address], [address] [place], [place] [address] [phone]

## 2018-12-03 NOTE — Telephone Encounter (Signed)
Medicaid order sent to [facility]. They will obtain the auth and reach out to the patient to schedule.  

## 2018-12-03 NOTE — Telephone Encounter (Signed)
Submitted PA diclofenac sodium on nctracks website. PA approved 12/03/18-12/03/19.   Confirmation #:[id number] W Prior Approval #:[id number] [place]  Faxed notice of approval to [facility]. Received fax notification.

## 2018-12-05 ENCOUNTER — Telehealth: Payer: Self-pay | Admitting: *Deleted

## 2018-12-05 ENCOUNTER — Other Ambulatory Visit (INDEPENDENT_AMBULATORY_CARE_PROVIDER_SITE_OTHER): Payer: Self-pay

## 2018-12-05 ENCOUNTER — Other Ambulatory Visit: Payer: Self-pay

## 2018-12-05 DIAGNOSIS — G35 Multiple sclerosis: Secondary | ICD-10-CM

## 2018-12-05 DIAGNOSIS — Z79899 Other long term (current) drug therapy: Secondary | ICD-10-CM

## 2018-12-05 DIAGNOSIS — Z0289 Encounter for other administrative examinations: Secondary | ICD-10-CM

## 2018-12-05 NOTE — Telephone Encounter (Signed)
Pl[facility] lab in [facility] lock box for routine lab pick up. Results pending. 

## 2018-12-06 LAB — CBC WITH DIFFERENTIAL/PLATELET
Basophils Absolute: 0 10*3/uL (ref 0.0–0.2)
Basos: 1 %
EOS (ABSOLUTE): 0.1 10*3/uL (ref 0.0–0.4)
Eos: 2 %
Hematocrit: 48.8 % (ref 37.5–51.0)
Hemoglobin: 16.7 g/dL (ref 13.0–17.7)
Immature Grans (Abs): 0 10*3/uL (ref 0.0–0.1)
Immature Granulocytes: 0 %
Lymphocytes Absolute: 3.6 10*3/uL — ABNORMAL HIGH (ref 0.7–3.1)
Lymphs: 50 %
MCH: 32.4 pg (ref 26.6–33.0)
MCHC: 34.2 g/dL (ref 31.5–35.7)
MCV: 95 fL (ref 79–97)
Monocytes Absolute: 0.7 10*3/uL (ref 0.1–0.9)
Monocytes: 9 %
NRBC: 1 % — ABNORMAL HIGH (ref 0–0)
Neutrophils Absolute: 2.7 10*3/uL (ref 1.4–7.0)
Neutrophils: 38 %
Platelets: 182 10*3/uL (ref 150–450)
RBC: 5.15 x10E6/uL (ref 4.14–5.80)
RDW: 14 % (ref 11.6–15.4)
WBC: 7.1 10*3/uL (ref 3.4–10.8)

## 2018-12-18 ENCOUNTER — Other Ambulatory Visit: Payer: Self-pay

## 2018-12-18 ENCOUNTER — Ambulatory Visit (HOSPITAL_COMMUNITY)
Admission: RE | Admit: 2018-12-18 | Discharge: 2018-12-18 | Disposition: A | Discharge status: Home / Self Care | Payer: Medicaid Other | Source: Ambulatory Visit | Attending: Neurology | Admitting: Neurology

## 2018-12-18 ENCOUNTER — Encounter (HOSPITAL_COMMUNITY): Payer: Medicaid Other

## 2018-12-18 DIAGNOSIS — G35 Multiple sclerosis: Secondary | ICD-10-CM | POA: Insufficient documentation

## 2018-12-18 MED ORDER — SODIUM CHLORIDE 0.9 % IV SOLN
300.0000 mg | INTRAVENOUS | Status: DC
  Administered 2018-12-18: 300 mg via INTRAVENOUS
  Filled 2018-12-18: qty 15

## 2018-12-18 MED ORDER — SODIUM CHLORIDE 0.9 % IV SOLN
INTRAVENOUS | Status: DC | PRN
  Administered 2018-12-18: 250 mL via INTRAVENOUS

## 2018-12-18 NOTE — Telephone Encounter (Signed)
Called [facility] at [phone] and requested test results be faxed to [phone]. Waiting on fax.

## 2018-12-18 NOTE — Progress Notes (Signed)
PATIENT CARE CENTER NOTE  Diagnosis:Multiple Sclerosis   Provider:Sater, Richard, MD   Procedure:Tysabri IV   Note:Patient received Tysabri infusion. Tolerated infusion well with no adverse reaction. Vital signs stable. Discharge instructions given. Patient did not want to stay the 1 hour post-infusion. Alert, oriented and ambulatory at discharge. 

## 2018-12-18 NOTE — Discharge Instructions (Signed)
Natalizumab injection What is this medicine? NATALIZUMAB (na ta LIZ you mab) is used to treat relapsing multiple sclerosis. This drug is not a cure. It is also used to treat Crohn's disease. This medicine may be used for other purposes; ask your health care provider or pharmacist if you have questions. COMMON BRAND NAME(S): Tysabri What should I tell my health care provider before I take this medicine? They need to know if you have any of these conditions:  immune system problems  progressive multifocal leukoencephalopathy (PML)  an unusual or allergic reaction to natalizumab, other medicines, foods, dyes, or preservatives  pregnant or trying to get pregnant  breast-feeding How should I use this medicine? This medicine is for infusion into a vein. It is given by a health care professional in a hospital or clinic setting. A special MedGuide will be given to you by the pharmacist with each prescription and refill. Be sure to read this information carefully each time. Talk to your pediatrician regarding the use of this medicine in children. This medicine is not approved for use in children. Overdosage: If you think you have taken too much of this medicine contact a poison control center or emergency room at once. NOTE: This medicine is only for you. Do not share this medicine with others. What if I miss a dose? It is important not to miss your dose. Call your doctor or health care professional if you are unable to keep an appointment. What may interact with this medicine?  azathioprine  cyclosporine  interferon  6-mercaptopurine  methotrexate  steroid medicines like prednisone or cortisone  TNF-alpha inhibitors like adalimumab, etanercept, and infliximab  vaccines This list may not describe all possible interactions. Give your health care provider a list of all the medicines, herbs, non-prescription drugs, or dietary supplements you use. Also tell them if you smoke, drink  alcohol, or use illegal drugs. Some items may interact with your medicine. What should I watch for while using this medicine? Your condition will be monitored carefully while you are receiving this medicine. Visit your doctor for regular check ups. Tell your doctor or healthcare professional if your symptoms do not start to get better or if they get worse. Stay away from people who are sick. Call your doctor or health care professional for advice if you get a fever, chills or sore throat, or other symptoms of a cold or flu. Do not treat yourself. In some patients, this medicine may cause a serious brain infection that may cause death. If you have any problems seeing, thinking, speaking, walking, or standing, tell your doctor right away. If you cannot reach your doctor, get urgent medical care. What side effects may I notice from receiving this medicine? Side effects that you should report to your doctor or health care professional as soon as possible:  allergic reactions like skin rash, itching or hives, swelling of the face, lips, or tongue  breathing problems  changes in vision  chest pain  dark urine  depression, feelings of sadness  dizziness  general ill feeling or flu-like symptoms  irregular, missed, or painful menstrual periods  light-colored stools  loss of appetite, nausea  muscle weakness  problems with balance, talking, or walking  right upper belly pain  unusually weak or tired  yellowing of the eyes or skin Side effects that usually do not require medical attention (report to your doctor or health care professional if they continue or are bothersome):  aches, pains  headache  stomach upset    tiredness This list may not describe all possible side effects. Call your doctor for medical advice about side effects. You may report side effects to FDA at 1-800-FDA-1088. Where should I keep my medicine? This drug is given in a hospital or clinic and will not be  stored at home. NOTE: This sheet is a summary. It may not cover all possible information. If you have questions about this medicine, talk to your doctor, pharmacist, or health care provider.  2020 Elsevier/Gold Standard (2008-07-25 13:33:21)  

## 2018-12-19 ENCOUNTER — Encounter (HOSPITAL_COMMUNITY): Payer: Medicaid Other

## 2018-12-19 NOTE — Telephone Encounter (Signed)
Received results via fax. JCV ab drawn on 12/05/18 negative, index value: 0.12

## 2019-01-14 ENCOUNTER — Other Ambulatory Visit: Payer: Self-pay

## 2019-01-14 ENCOUNTER — Telehealth: Payer: Self-pay | Admitting: *Deleted

## 2019-01-14 ENCOUNTER — Ambulatory Visit
Admission: RE | Admit: 2019-01-14 | Discharge: 2019-01-14 | Disposition: A | Discharge status: Home / Self Care | Payer: Medicaid Other | Source: Ambulatory Visit | Attending: Neurology | Admitting: Neurology

## 2019-01-14 DIAGNOSIS — G35 Multiple sclerosis: Secondary | ICD-10-CM | POA: Diagnosis not present

## 2019-01-14 NOTE — Telephone Encounter (Signed)
Called, LVM (ok per DPR) relaying results per Dr. Felecia Shelling note. Gave [facility] phone number if he has further questions.

## 2019-01-14 NOTE — Telephone Encounter (Signed)
-----   Message from Britt Bottom, MD sent at 01/14/2019  3:35 PM EDT ----- Please let the patient know that the MRi of thr brain did not show any new lesions

## 2019-01-15 ENCOUNTER — Other Ambulatory Visit: Payer: Self-pay

## 2019-01-15 ENCOUNTER — Ambulatory Visit (HOSPITAL_COMMUNITY)
Admission: RE | Admit: 2019-01-15 | Discharge: 2019-01-15 | Disposition: A | Discharge status: Home / Self Care | Payer: Medicaid Other | Source: Ambulatory Visit | Attending: Internal Medicine | Admitting: Internal Medicine

## 2019-01-15 DIAGNOSIS — G35 Multiple sclerosis: Secondary | ICD-10-CM | POA: Diagnosis not present

## 2019-01-15 MED ORDER — SODIUM CHLORIDE 0.9 % IV SOLN
INTRAVENOUS | Status: DC | PRN
  Administered 2019-01-15: 250 mL via INTRAVENOUS

## 2019-01-15 MED ORDER — SODIUM CHLORIDE 0.9 % IV SOLN
300.0000 mg | Freq: Once | INTRAVENOUS | Status: AC
  Administered 2019-01-15: 300 mg via INTRAVENOUS
  Filled 2019-01-15: qty 15

## 2019-01-15 NOTE — Discharge Instructions (Signed)
Natalizumab injection What is this medicine? NATALIZUMAB (na ta LIZ you mab) is used to treat relapsing multiple sclerosis. This drug is not a cure. It is also used to treat Crohn's disease. This medicine may be used for other purposes; ask your health care provider or pharmacist if you have questions. COMMON BRAND NAME(S): Tysabri What should I tell my health care provider before I take this medicine? They need to know if you have any of these conditions:  immune system problems  progressive multifocal leukoencephalopathy (PML)  an unusual or allergic reaction to natalizumab, other medicines, foods, dyes, or preservatives  pregnant or trying to get pregnant  breast-feeding How should I use this medicine? This medicine is for infusion into a vein. It is given by a health care professional in a hospital or clinic setting. A special MedGuide will be given to you by the pharmacist with each prescription and refill. Be sure to read this information carefully each time. Talk to your pediatrician regarding the use of this medicine in children. This medicine is not approved for use in children. Overdosage: If you think you have taken too much of this medicine contact a poison control center or emergency room at once. NOTE: This medicine is only for you. Do not share this medicine with others. What if I miss a dose? It is important not to miss your dose. Call your doctor or health care professional if you are unable to keep an appointment. What may interact with this medicine?  azathioprine  cyclosporine  interferon  6-mercaptopurine  methotrexate  steroid medicines like prednisone or cortisone  TNF-alpha inhibitors like adalimumab, etanercept, and infliximab  vaccines This list may not describe all possible interactions. Give your health care provider a list of all the medicines, herbs, non-prescription drugs, or dietary supplements you use. Also tell them if you smoke, drink  alcohol, or use illegal drugs. Some items may interact with your medicine. What should I watch for while using this medicine? Your condition will be monitored carefully while you are receiving this medicine. Visit your doctor for regular check ups. Tell your doctor or healthcare professional if your symptoms do not start to get better or if they get worse. Stay away from people who are sick. Call your doctor or health care professional for advice if you get a fever, chills or sore throat, or other symptoms of a cold or flu. Do not treat yourself. In some patients, this medicine may cause a serious brain infection that may cause death. If you have any problems seeing, thinking, speaking, walking, or standing, tell your doctor right away. If you cannot reach your doctor, get urgent medical care. What side effects may I notice from receiving this medicine? Side effects that you should report to your doctor or health care professional as soon as possible:  allergic reactions like skin rash, itching or hives, swelling of the face, lips, or tongue  breathing problems  changes in vision  chest pain  dark urine  depression, feelings of sadness  dizziness  general ill feeling or flu-like symptoms  irregular, missed, or painful menstrual periods  light-colored stools  loss of appetite, nausea  muscle weakness  problems with balance, talking, or walking  right upper belly pain  unusually weak or tired  yellowing of the eyes or skin Side effects that usually do not require medical attention (report to your doctor or health care professional if they continue or are bothersome):  aches, pains  headache  stomach upset    tiredness This list may not describe all possible side effects. Call your doctor for medical advice about side effects. You may report side effects to FDA at 1-800-FDA-1088. Where should I keep my medicine? This drug is given in a hospital or clinic and will not be  stored at home. NOTE: This sheet is a summary. It may not cover all possible information. If you have questions about this medicine, talk to your doctor, pharmacist, or health care provider.  2020 Elsevier/Gold Standard (2008-07-25 13:33:21)  

## 2019-01-15 NOTE — Progress Notes (Signed)
                   Patient Care Center Note   Diagnosis: Multiple Sclerosis   Provider: Arlice Colt, MD   Procedure: Fritzi Mandes IV   Note: Received Tysabri infusion via PIV. Tolerated well. Vital signs stable. Discharge instructions given. Patient refused to stay 1 hour post-infusion observation period. Alert, oriented and ambulatory with cane at discharge.    Otho Bellows, RN

## 2019-01-20 ENCOUNTER — Telehealth: Payer: Self-pay | Admitting: *Deleted

## 2019-01-20 NOTE — Telephone Encounter (Signed)
Faxed completed/signed Tysabri pt status report and reauth questionnaire to [facility] touch at [phone]. Received confirmation.   Received fax notification from touch prescribing program that pt re-authorized for Tysabri from 01/20/2019-08/21/2019. Patient enrollment number: [id number]. Account: [facility]. Site auth number: [id number]

## 2019-02-13 ENCOUNTER — Ambulatory Visit (HOSPITAL_COMMUNITY)
Admission: RE | Admit: 2019-02-13 | Discharge: 2019-02-13 | Disposition: A | Discharge status: Home / Self Care | Payer: Medicaid Other | Source: Ambulatory Visit | Attending: Neurology | Admitting: Neurology

## 2019-02-13 ENCOUNTER — Other Ambulatory Visit: Payer: Self-pay

## 2019-02-13 DIAGNOSIS — G35 Multiple sclerosis: Secondary | ICD-10-CM | POA: Insufficient documentation

## 2019-02-13 MED ORDER — SODIUM CHLORIDE 0.9 % IV SOLN
300.0000 mg | INTRAVENOUS | Status: DC
  Administered 2019-02-13: 300 mg via INTRAVENOUS
  Filled 2019-02-13: qty 15

## 2019-02-13 MED ORDER — SODIUM CHLORIDE 0.9 % IV SOLN
INTRAVENOUS | Status: DC | PRN
  Administered 2019-02-13: 250 mL via INTRAVENOUS

## 2019-02-13 NOTE — Progress Notes (Signed)
PATIENT CARE CENTER NOTE  Diagnosis:Multiple Sclerosis   Provider:Sater, Richard, MD   Procedure:Tysabri IV   Note:Patient received Tysabri infusion. Tolerated infusion well with no adverse reaction. Vital signs stable. Discharge instructions given. Patient did not want to stay the 1 hour post-infusion. Alert, oriented and ambulatory at discharge. 

## 2019-02-13 NOTE — Discharge Instructions (Signed)
Natalizumab injection What is this medicine? NATALIZUMAB (na ta LIZ you mab) is used to treat relapsing multiple sclerosis. This drug is not a cure. It is also used to treat Crohn's disease. This medicine may be used for other purposes; ask your health care provider or pharmacist if you have questions. COMMON BRAND NAME(S): Tysabri What should I tell my health care provider before I take this medicine? They need to know if you have any of these conditions:  immune system problems  progressive multifocal leukoencephalopathy (PML)  an unusual or allergic reaction to natalizumab, other medicines, foods, dyes, or preservatives  pregnant or trying to get pregnant  breast-feeding How should I use this medicine? This medicine is for infusion into a vein. It is given by a health care professional in a hospital or clinic setting. A special MedGuide will be given to you by the pharmacist with each prescription and refill. Be sure to read this information carefully each time. Talk to your pediatrician regarding the use of this medicine in children. This medicine is not approved for use in children. Overdosage: If you think you have taken too much of this medicine contact a poison control center or emergency room at once. NOTE: This medicine is only for you. Do not share this medicine with others. What if I miss a dose? It is important not to miss your dose. Call your doctor or health care professional if you are unable to keep an appointment. What may interact with this medicine?  azathioprine  cyclosporine  interferon  6-mercaptopurine  methotrexate  steroid medicines like prednisone or cortisone  TNF-alpha inhibitors like adalimumab, etanercept, and infliximab  vaccines This list may not describe all possible interactions. Give your health care provider a list of all the medicines, herbs, non-prescription drugs, or dietary supplements you use. Also tell them if you smoke, drink  alcohol, or use illegal drugs. Some items may interact with your medicine. What should I watch for while using this medicine? Your condition will be monitored carefully while you are receiving this medicine. Visit your doctor for regular check ups. Tell your doctor or healthcare professional if your symptoms do not start to get better or if they get worse. Stay away from people who are sick. Call your doctor or health care professional for advice if you get a fever, chills or sore throat, or other symptoms of a cold or flu. Do not treat yourself. In some patients, this medicine may cause a serious brain infection that may cause death. If you have any problems seeing, thinking, speaking, walking, or standing, tell your doctor right away. If you cannot reach your doctor, get urgent medical care. What side effects may I notice from receiving this medicine? Side effects that you should report to your doctor or health care professional as soon as possible:  allergic reactions like skin rash, itching or hives, swelling of the face, lips, or tongue  breathing problems  changes in vision  chest pain  dark urine  depression, feelings of sadness  dizziness  general ill feeling or flu-like symptoms  irregular, missed, or painful menstrual periods  light-colored stools  loss of appetite, nausea  muscle weakness  problems with balance, talking, or walking  right upper belly pain  unusually weak or tired  yellowing of the eyes or skin Side effects that usually do not require medical attention (report to your doctor or health care professional if they continue or are bothersome):  aches, pains  headache  stomach upset    tiredness This list may not describe all possible side effects. Call your doctor for medical advice about side effects. You may report side effects to FDA at 1-800-FDA-1088. Where should I keep my medicine? This drug is given in a hospital or clinic and will not be  stored at home. NOTE: This sheet is a summary. It may not cover all possible information. If you have questions about this medicine, talk to your doctor, pharmacist, or health care provider.  2020 Elsevier/Gold Standard (2008-07-25 13:33:21)  

## 2019-03-13 ENCOUNTER — Other Ambulatory Visit: Payer: Self-pay

## 2019-03-13 ENCOUNTER — Ambulatory Visit (HOSPITAL_COMMUNITY)
Admission: RE | Admit: 2019-03-13 | Discharge: 2019-03-13 | Disposition: A | Discharge status: Home / Self Care | Payer: Medicaid Other | Source: Ambulatory Visit | Attending: Neurology | Admitting: Neurology

## 2019-03-13 DIAGNOSIS — G35 Multiple sclerosis: Secondary | ICD-10-CM | POA: Insufficient documentation

## 2019-03-13 MED ORDER — SODIUM CHLORIDE 0.9 % IV SOLN
INTRAVENOUS | Status: DC | PRN
  Administered 2019-03-13: 250 mL via INTRAVENOUS

## 2019-03-13 MED ORDER — SODIUM CHLORIDE 0.9 % IV SOLN
300.0000 mg | Freq: Once | INTRAVENOUS | Status: AC
  Administered 2019-03-13: 10:00:00 300 mg via INTRAVENOUS
  Filled 2019-03-13: qty 15

## 2019-03-13 NOTE — Discharge Instructions (Signed)
Natalizumab injection What is this medicine? NATALIZUMAB (na ta LIZ you mab) is used to treat relapsing multiple sclerosis. This drug is not a cure. It is also used to treat Crohn's disease. This medicine may be used for other purposes; ask your health care provider or pharmacist if you have questions. COMMON BRAND NAME(S): Tysabri What should I tell my health care provider before I take this medicine? They need to know if you have any of these conditions:  immune system problems  progressive multifocal leukoencephalopathy (PML)  an unusual or allergic reaction to natalizumab, other medicines, foods, dyes, or preservatives  pregnant or trying to get pregnant  breast-feeding How should I use this medicine? This medicine is for infusion into a vein. It is given by a health care professional in a hospital or clinic setting. A special MedGuide will be given to you by the pharmacist with each prescription and refill. Be sure to read this information carefully each time. Talk to your pediatrician regarding the use of this medicine in children. This medicine is not approved for use in children. Overdosage: If you think you have taken too much of this medicine contact a poison control center or emergency room at once. NOTE: This medicine is only for you. Do not share this medicine with others. What if I miss a dose? It is important not to miss your dose. Call your doctor or health care professional if you are unable to keep an appointment. What may interact with this medicine?  azathioprine  cyclosporine  interferon  6-mercaptopurine  methotrexate  steroid medicines like prednisone or cortisone  TNF-alpha inhibitors like adalimumab, etanercept, and infliximab  vaccines This list may not describe all possible interactions. Give your health care provider a list of all the medicines, herbs, non-prescription drugs, or dietary supplements you use. Also tell them if you smoke, drink  alcohol, or use illegal drugs. Some items may interact with your medicine. What should I watch for while using this medicine? Your condition will be monitored carefully while you are receiving this medicine. Visit your doctor for regular check ups. Tell your doctor or healthcare professional if your symptoms do not start to get better or if they get worse. Stay away from people who are sick. Call your doctor or health care professional for advice if you get a fever, chills or sore throat, or other symptoms of a cold or flu. Do not treat yourself. In some patients, this medicine may cause a serious brain infection that may cause death. If you have any problems seeing, thinking, speaking, walking, or standing, tell your doctor right away. If you cannot reach your doctor, get urgent medical care. What side effects may I notice from receiving this medicine? Side effects that you should report to your doctor or health care professional as soon as possible:  allergic reactions like skin rash, itching or hives, swelling of the face, lips, or tongue  breathing problems  changes in vision  chest pain  dark urine  depression, feelings of sadness  dizziness  general ill feeling or flu-like symptoms  irregular, missed, or painful menstrual periods  light-colored stools  loss of appetite, nausea  muscle weakness  problems with balance, talking, or walking  right upper belly pain  unusually weak or tired  yellowing of the eyes or skin Side effects that usually do not require medical attention (report to your doctor or health care professional if they continue or are bothersome):  aches, pains  headache  stomach upset    tiredness This list may not describe all possible side effects. Call your doctor for medical advice about side effects. You may report side effects to FDA at 1-800-FDA-1088. Where should I keep my medicine? This drug is given in a hospital or clinic and will not be  stored at home. NOTE: This sheet is a summary. It may not cover all possible information. If you have questions about this medicine, talk to your doctor, pharmacist, or health care provider.  2020 Elsevier/Gold Standard (2008-07-25 13:33:21)  

## 2019-03-13 NOTE — Progress Notes (Signed)
Patient received Tysabri via PIV. Declined 1 hour post infusion observation. Tolerated well, vitals stable, discharge instructions given, verbalized understanding. Patient alert, oriented and ambulatory at the time of discharge.  

## 2019-04-10 ENCOUNTER — Other Ambulatory Visit: Payer: Self-pay

## 2019-04-10 ENCOUNTER — Ambulatory Visit (HOSPITAL_COMMUNITY)
Admission: RE | Admit: 2019-04-10 | Discharge: 2019-04-10 | Disposition: A | Discharge status: Home / Self Care | Payer: Medicaid Other | Source: Ambulatory Visit | Attending: Neurology | Admitting: Neurology

## 2019-04-10 DIAGNOSIS — G35 Multiple sclerosis: Secondary | ICD-10-CM | POA: Diagnosis not present

## 2019-04-10 MED ORDER — SODIUM CHLORIDE 0.9 % IV SOLN
300.0000 mg | INTRAVENOUS | Status: DC
  Administered 2019-04-10: 300 mg via INTRAVENOUS
  Filled 2019-04-10: qty 15

## 2019-04-10 MED ORDER — SODIUM CHLORIDE 0.9 % IV SOLN
INTRAVENOUS | Status: DC | PRN
  Administered 2019-04-10: 250 mL via INTRAVENOUS

## 2019-04-10 NOTE — Discharge Instructions (Signed)
Natalizumab injection What is this medicine? NATALIZUMAB (na ta LIZ you mab) is used to treat relapsing multiple sclerosis. This drug is not a cure. It is also used to treat Crohn's disease. This medicine may be used for other purposes; ask your health care provider or pharmacist if you have questions. COMMON BRAND NAME(S): Tysabri What should I tell my health care provider before I take this medicine? They need to know if you have any of these conditions:  immune system problems  progressive multifocal leukoencephalopathy (PML)  an unusual or allergic reaction to natalizumab, other medicines, foods, dyes, or preservatives  pregnant or trying to get pregnant  breast-feeding How should I use this medicine? This medicine is for infusion into a vein. It is given by a health care professional in a hospital or clinic setting. A special MedGuide will be given to you by the pharmacist with each prescription and refill. Be sure to read this information carefully each time. Talk to your pediatrician regarding the use of this medicine in children. This medicine is not approved for use in children. Overdosage: If you think you have taken too much of this medicine contact a poison control center or emergency room at once. NOTE: This medicine is only for you. Do not share this medicine with others. What if I miss a dose? It is important not to miss your dose. Call your doctor or health care professional if you are unable to keep an appointment. What may interact with this medicine?  azathioprine  cyclosporine  interferon  6-mercaptopurine  methotrexate  steroid medicines like prednisone or cortisone  TNF-alpha inhibitors like adalimumab, etanercept, and infliximab  vaccines This list may not describe all possible interactions. Give your health care provider a list of all the medicines, herbs, non-prescription drugs, or dietary supplements you use. Also tell them if you smoke, drink  alcohol, or use illegal drugs. Some items may interact with your medicine. What should I watch for while using this medicine? Your condition will be monitored carefully while you are receiving this medicine. Visit your doctor for regular check ups. Tell your doctor or healthcare professional if your symptoms do not start to get better or if they get worse. Stay away from people who are sick. Call your doctor or health care professional for advice if you get a fever, chills or sore throat, or other symptoms of a cold or flu. Do not treat yourself. In some patients, this medicine may cause a serious brain infection that may cause death. If you have any problems seeing, thinking, speaking, walking, or standing, tell your doctor right away. If you cannot reach your doctor, get urgent medical care. What side effects may I notice from receiving this medicine? Side effects that you should report to your doctor or health care professional as soon as possible:  allergic reactions like skin rash, itching or hives, swelling of the face, lips, or tongue  breathing problems  changes in vision  chest pain  dark urine  depression, feelings of sadness  dizziness  general ill feeling or flu-like symptoms  irregular, missed, or painful menstrual periods  light-colored stools  loss of appetite, nausea  muscle weakness  problems with balance, talking, or walking  right upper belly pain  unusually weak or tired  yellowing of the eyes or skin Side effects that usually do not require medical attention (report to your doctor or health care professional if they continue or are bothersome):  aches, pains  headache  stomach upset    tiredness This list may not describe all possible side effects. Call your doctor for medical advice about side effects. You may report side effects to FDA at 1-800-FDA-1088. Where should I keep my medicine? This drug is given in a hospital or clinic and will not be  stored at home. NOTE: This sheet is a summary. It may not cover all possible information. If you have questions about this medicine, talk to your doctor, pharmacist, or health care provider.  2020 Elsevier/Gold Standard (2008-07-25 13:33:21)  

## 2019-04-10 NOTE — Progress Notes (Signed)
PATIENT CARE CENTER NOTE  Diagnosis:Multiple Sclerosis   Provider:Sater, Richard, MD   Procedure:Tysabri IV   Note:Patient received Tysabri infusion. Tolerated infusion well with no adverse reaction. Vital signs stable. Discharge instructions given. Patient did not want to stay the 1 hour post-infusion. Alert, oriented and ambulatory at discharge. 

## 2019-05-08 ENCOUNTER — Encounter (HOSPITAL_COMMUNITY): Payer: Medicaid Other

## 2019-05-09 ENCOUNTER — Other Ambulatory Visit: Payer: Self-pay

## 2019-05-09 ENCOUNTER — Ambulatory Visit (HOSPITAL_COMMUNITY)
Admission: RE | Admit: 2019-05-09 | Discharge: 2019-05-09 | Disposition: A | Discharge status: Home / Self Care | Payer: Medicaid Other | Source: Ambulatory Visit | Attending: Neurology | Admitting: Neurology

## 2019-05-09 DIAGNOSIS — G35 Multiple sclerosis: Secondary | ICD-10-CM | POA: Diagnosis not present

## 2019-05-09 MED ORDER — SODIUM CHLORIDE 0.9 % IV SOLN
INTRAVENOUS | Status: DC | PRN
  Administered 2019-05-09: 250 mL via INTRAVENOUS

## 2019-05-09 MED ORDER — SODIUM CHLORIDE 0.9 % IV SOLN
300.0000 mg | Freq: Once | INTRAVENOUS | Status: AC
  Administered 2019-05-09: 300 mg via INTRAVENOUS
  Filled 2019-05-09: qty 15

## 2019-05-09 NOTE — Discharge Instructions (Signed)
Natalizumab injection What is this medicine? NATALIZUMAB (na ta LIZ you mab) is used to treat relapsing multiple sclerosis. This drug is not a cure. It is also used to treat Crohn's disease. This medicine may be used for other purposes; ask your health care provider or pharmacist if you have questions. COMMON BRAND NAME(S): Tysabri What should I tell my health care provider before I take this medicine? They need to know if you have any of these conditions:  immune system problems  progressive multifocal leukoencephalopathy (PML)  an unusual or allergic reaction to natalizumab, other medicines, foods, dyes, or preservatives  pregnant or trying to get pregnant  breast-feeding How should I use this medicine? This medicine is for infusion into a vein. It is given by a health care professional in a hospital or clinic setting. A special MedGuide will be given to you by the pharmacist with each prescription and refill. Be sure to read this information carefully each time. Talk to your pediatrician regarding the use of this medicine in children. This medicine is not approved for use in children. Overdosage: If you think you have taken too much of this medicine contact a poison control center or emergency room at once. NOTE: This medicine is only for you. Do not share this medicine with others. What if I miss a dose? It is important not to miss your dose. Call your doctor or health care professional if you are unable to keep an appointment. What may interact with this medicine?  azathioprine  cyclosporine  interferon  6-mercaptopurine  methotrexate  steroid medicines like prednisone or cortisone  TNF-alpha inhibitors like adalimumab, etanercept, and infliximab  vaccines This list may not describe all possible interactions. Give your health care provider a list of all the medicines, herbs, non-prescription drugs, or dietary supplements you use. Also tell them if you smoke, drink  alcohol, or use illegal drugs. Some items may interact with your medicine. What should I watch for while using this medicine? Your condition will be monitored carefully while you are receiving this medicine. Visit your doctor for regular check ups. Tell your doctor or healthcare professional if your symptoms do not start to get better or if they get worse. Stay away from people who are sick. Call your doctor or health care professional for advice if you get a fever, chills or sore throat, or other symptoms of a cold or flu. Do not treat yourself. In some patients, this medicine may cause a serious brain infection that may cause death. If you have any problems seeing, thinking, speaking, walking, or standing, tell your doctor right away. If you cannot reach your doctor, get urgent medical care. What side effects may I notice from receiving this medicine? Side effects that you should report to your doctor or health care professional as soon as possible:  allergic reactions like skin rash, itching or hives, swelling of the face, lips, or tongue  breathing problems  changes in vision  chest pain  dark urine  depression, feelings of sadness  dizziness  general ill feeling or flu-like symptoms  irregular, missed, or painful menstrual periods  light-colored stools  loss of appetite, nausea  muscle weakness  problems with balance, talking, or walking  right upper belly pain  unusually weak or tired  yellowing of the eyes or skin Side effects that usually do not require medical attention (report to your doctor or health care professional if they continue or are bothersome):  aches, pains  headache  stomach upset    tiredness This list may not describe all possible side effects. Call your doctor for medical advice about side effects. You may report side effects to FDA at 1-800-FDA-1088. Where should I keep my medicine? This drug is given in a hospital or clinic and will not be  stored at home. NOTE: This sheet is a summary. It may not cover all possible information. If you have questions about this medicine, talk to your doctor, pharmacist, or health care provider.  2020 Elsevier/Gold Standard (2008-07-25 13:33:21)  

## 2019-05-09 NOTE — Progress Notes (Signed)
Pt received Tysabri infusion at [facility] today without complications. Vital signs stable, pt alert and ambulatory at time of discharge. Pt declined to stay for monitoring, stated "I really need to go". Pt educated and discharged. Coolidge Breeze, RN 05/09/2019

## 2019-06-02 ENCOUNTER — Other Ambulatory Visit: Payer: Self-pay

## 2019-06-02 ENCOUNTER — Ambulatory Visit (HOSPITAL_COMMUNITY)
Admission: RE | Admit: 2019-06-02 | Discharge: 2019-06-02 | Disposition: A | Discharge status: Home / Self Care | Payer: Medicaid Other | Source: Ambulatory Visit | Attending: Neurology | Admitting: Neurology

## 2019-06-02 DIAGNOSIS — G35 Multiple sclerosis: Secondary | ICD-10-CM | POA: Insufficient documentation

## 2019-06-02 MED ORDER — SODIUM CHLORIDE 0.9 % IV SOLN
INTRAVENOUS | Status: DC | PRN
  Administered 2019-06-02: 10:00:00 250 mL via INTRAVENOUS

## 2019-06-02 MED ORDER — SODIUM CHLORIDE 0.9 % IV SOLN
300.0000 mg | INTRAVENOUS | Status: DC
  Administered 2019-06-02: 300 mg via INTRAVENOUS
  Filled 2019-06-02: qty 15

## 2019-06-02 NOTE — Discharge Instructions (Signed)
Natalizumab injection What is this medicine? NATALIZUMAB (na ta LIZ you mab) is used to treat relapsing multiple sclerosis. This drug is not a cure. It is also used to treat Crohn's disease. This medicine may be used for other purposes; ask your health care provider or pharmacist if you have questions. COMMON BRAND NAME(S): Tysabri What should I tell my health care provider before I take this medicine? They need to know if you have any of these conditions:  immune system problems  progressive multifocal leukoencephalopathy (PML)  an unusual or allergic reaction to natalizumab, other medicines, foods, dyes, or preservatives  pregnant or trying to get pregnant  breast-feeding How should I use this medicine? This medicine is for infusion into a vein. It is given by a health care professional in a hospital or clinic setting. A special MedGuide will be given to you by the pharmacist with each prescription and refill. Be sure to read this information carefully each time. Talk to your pediatrician regarding the use of this medicine in children. This medicine is not approved for use in children. Overdosage: If you think you have taken too much of this medicine contact a poison control center or emergency room at once. NOTE: This medicine is only for you. Do not share this medicine with others. What if I miss a dose? It is important not to miss your dose. Call your doctor or health care professional if you are unable to keep an appointment. What may interact with this medicine?  azathioprine  cyclosporine  interferon  6-mercaptopurine  methotrexate  steroid medicines like prednisone or cortisone  TNF-alpha inhibitors like adalimumab, etanercept, and infliximab  vaccines This list may not describe all possible interactions. Give your health care provider a list of all the medicines, herbs, non-prescription drugs, or dietary supplements you use. Also tell them if you smoke, drink  alcohol, or use illegal drugs. Some items may interact with your medicine. What should I watch for while using this medicine? Your condition will be monitored carefully while you are receiving this medicine. Visit your doctor for regular check ups. Tell your doctor or healthcare professional if your symptoms do not start to get better or if they get worse. Stay away from people who are sick. Call your doctor or health care professional for advice if you get a fever, chills or sore throat, or other symptoms of a cold or flu. Do not treat yourself. In some patients, this medicine may cause a serious brain infection that may cause death. If you have any problems seeing, thinking, speaking, walking, or standing, tell your doctor right away. If you cannot reach your doctor, get urgent medical care. What side effects may I notice from receiving this medicine? Side effects that you should report to your doctor or health care professional as soon as possible:  allergic reactions like skin rash, itching or hives, swelling of the face, lips, or tongue  breathing problems  changes in vision  chest pain  dark urine  depression, feelings of sadness  dizziness  general ill feeling or flu-like symptoms  irregular, missed, or painful menstrual periods  light-colored stools  loss of appetite, nausea  muscle weakness  problems with balance, talking, or walking  right upper belly pain  unusually weak or tired  yellowing of the eyes or skin Side effects that usually do not require medical attention (report to your doctor or health care professional if they continue or are bothersome):  aches, pains  headache  stomach upset    tiredness This list may not describe all possible side effects. Call your doctor for medical advice about side effects. You may report side effects to FDA at 1-800-FDA-1088. Where should I keep my medicine? This drug is given in a hospital or clinic and will not be  stored at home. NOTE: This sheet is a summary. It may not cover all possible information. If you have questions about this medicine, talk to your doctor, pharmacist, or health care provider.  2020 Elsevier/Gold Standard (2008-07-25 13:33:21)  

## 2019-06-02 NOTE — Progress Notes (Signed)
PATIENT CARE CENTER NOTE  Diagnosis:Multiple Sclerosis   Provider:Sater, Richard, MD   Procedure:Tysabri IV   Note:Patient received Tysabri infusion. Tolerated infusion well with no adverse reaction. Vital signs stable. Discharge instructions given. Patient did not want to stay the 1 hour post-infusion. Alert, oriented and ambulatory at discharge. 

## 2019-06-05 ENCOUNTER — Ambulatory Visit: Payer: Medicaid Other | Admitting: Neurology

## 2019-06-05 ENCOUNTER — Encounter: Payer: Self-pay | Admitting: Neurology

## 2019-06-05 ENCOUNTER — Telehealth: Payer: Self-pay | Admitting: *Deleted

## 2019-06-05 ENCOUNTER — Other Ambulatory Visit: Payer: Self-pay

## 2019-06-05 VITALS — BP 110/70 | HR 54 | Temp 97.0°F | Ht 71.0 in | Wt 193.0 lb

## 2019-06-05 DIAGNOSIS — G35 Multiple sclerosis: Secondary | ICD-10-CM

## 2019-06-05 DIAGNOSIS — R3911 Hesitancy of micturition: Secondary | ICD-10-CM

## 2019-06-05 DIAGNOSIS — Z79899 Other long term (current) drug therapy: Secondary | ICD-10-CM

## 2019-06-05 DIAGNOSIS — R261 Paralytic gait: Secondary | ICD-10-CM

## 2019-06-05 DIAGNOSIS — R200 Anesthesia of skin: Secondary | ICD-10-CM

## 2019-06-05 NOTE — Telephone Encounter (Signed)
Pl[facility] lab in [facility] lock box for routine lab pick up. Results pending. 

## 2019-06-05 NOTE — Progress Notes (Signed)
[name]  PATIENT: Bradley Brandt DOB: 05/22/1981  REFERRING DOCTOR OR PCP:  Bradley Brandt SOURCE: patient, records form Bradley Brandt, MRI reports and images on PACS  _________________________________   HISTORICAL  CHIEF COMPLAINT:  Chief Complaint  Patient presents with  . Follow-up    RM 13, alone. Last seen 12/03/2018.   . Multiple Sclerosis    On tysabri. Last JCV 12/05/18 negative, index: 0.12. NEEDS LABS. Last infusion: 06/02/19 and next one 07/03/19. [facility].    HISTORY OF PRESENT ILLNESS:  Bradley Brandt is a 38 y.o.  man with multiple sclerosis.     Update 06/05/2019: He is on Tysabri and tolerates it well.     He has been JCV negative (0.12 12/05/2018).   No exacerbations or new MS symptoms.   He has a poor gait and has had occasional falls, last one 2 days ago.   He reports weakness in his legs and also notes spasticity, helped by baclofen.    He feels the Ampyra helps the walking some.    He feels the vision is about the same.   He will be getting new glasses soon.    He notes urinary hesitancy only partially helped by tamsulosin.  He feels his fatigue is doing ok.   He sleeps ok most nights.    He denies depression and anxiety.    He notes cognition is about the same.  Focus is sometimes poor.     He thinks he may have had Covid as he was sick a few months ago for 3 days but never got tested.   We discussed Covid risks and MS.  Tysabri should not add much risk.  Update 12/03/2018 (virtual): He is on Tysabri as his disease modifying therapy.  He tolerates it well.  He has been JCV antibody negative.  His last test was about 8 months ago.  He has not had any exacerbations on the medication.  He does not note any worsening of any of his symptoms.  His main issue continues to be a reduced gait.  He has right much greater than left spasticity and weakness.  Baclofen helps the spasticity some.  This involves his leg more than  the arm.  He uses a cane.  He denies any recent falls.  He stumbles some.  Dalfampridine has helped the gait.  He also has some dysesthetic pain in the right leg and some back pain.  Gabapentin has helped.  He has urinary hesitancy with some benefit from tamsulosin.  He denies any difficulties with his vision.  He notes some fatigue but feels it is not too troubling.  He has some insomnia but generally sleeps well most nights.  He denies any change with cognition.  He has some word finding difficulties at times and reduced focus and attention..   Mood has done well.  Observations/Objective:   Assessment and Plan: No diagnosis found.   Update 06/03/2018: He is on Tysabri and he tolerates it well.  He denies any new symptoms.   He has a poor gait due to right > left weakness and reduced balance.   He uses a cane.    He notes some numbness in the right leg.   He has lower back pain.  He takes gabapentin and baclofen with some benefit.   He is out of his NSAID.     Vision is stable.   He denies diplopia, vision loss or color loss.  He wears glasses.   He has some urinary urgency and occasionally has incontinence in the early morning while in bed.   He is on tamsulosin as he has hesitancy.  He is also on baclofen for muscle spasms.      He sleeps ok most nights.    Mood is doing well off fluoxetine.  He has some fatigue and his legs tire out easily.   He has some word finding difficulties and reduced focus/attention.   Update 12/31/2017: He is out of prison and back home.      He is on Tysabri and tolerates it well (infusions are at [facility])   He denies any recent exacerbation.  He uses a walker for his gait though will furniture surf some in the house.   He has fallen a few times.    His right leg is stronger than the left leg.     Arm strength is mildly reduced on th e left and coordination is poor in that arm.   He has tingling dysesthesia in the left leg.   He feels his vision is worse but he  did better with a new pair of glasses.       He has trouble emptying his bladder but has no incontinence.   Flomax has helped.     Mood is doing about the same.    He does better with Prozac for mood and Seroquel for anxiety and sleep.    Insomnia bothers him some nights.    He has fatigue. He feels cognition has been stable.      Update 08/02/2017: He is on Tysabri. He tolerates it well. His last JCV antibody was negative and 0.12.     He reports vision is worse on the right and his ophthalmologist told him to come back to see me.   Bradley Brandt states that he can't see well without his glasses but is seeing ok with them when he uses both eyes.[name]   His right eye worsened sometime over the past 3 months (started while still in prison).  He is uncertain if the vision worsened rapidly or over a longer time span. He feels his vision is about the same now as a couple weeks ago.  He continues to have trouble walking and can go about 100 feet with a cane without stopping.    Leg strength is symmetric.  He has some spasticity.     He notes pins and needles dysesthesia in his legs.   Arms have no weakness or numbness.      Bladder hesitancy is better on Flomax but he has occasional incontinence at night.     Fatigue is moderate many days.     He feels mood is doing ok.   He used to see psychiatry.   Update 07/03/2017: He is on Tysabri and he tolerated it well.   He has not had any exacerbation recently.   He notes reduced gait and can walk abput 50-100 feet without stopping.    He feels both legs are weak and left = right.    He notes spasticity in both legs.   He notes a pins/needles tingling dysesthesia in both legs.   His arms are strong.    He has trouble with urinary hesitancy and often needs to push to empty.    He has not had any UTI.    Flomax has helped.        He notes increased fatigue but also  has trouble falling asleep.        He has finished his jail term and is back home.    From  08/23/2016: He is currently on Tysabri. He tolerates it well and feels that his MS has done better on Tysabri than any other medication. He also notes that it helps his fatigue. He reports being JCV antibody negative  Gait/strength/sensation: He has difficulty with his gait and walk without a cane. He notes spasticity in both legs. He feels that there is chronic mild right leg weakness but that the left leg weakness got worse more recently.   He notes some tingling in the feet but does not feel that it is painful.   Baclofen has helped the spasticity.   Mirtazepine helped his sleep but he didn't feel he thought as clearly the next day and he prefers not to take.     Vision: He feels vision is doing ok with newer glasses. He has not noted changes in color vision. He notes no diplopia. .   Bladder: He notes increased urinary frequency but no incontinence.  Hesitancy is better on Flomax.     Fatigue/sleep: He notes fatigue and sleepiness in the afternoons that improved on Tysabri.  Sleeps well at night  Mood/cognition: He denies any depression or anxiety now but had some in the past.   He has been diagnosed with bipolar disease and is on a psychiatric medicine but does not know the name.. Notes some decreased focus and attention but notes no major cognitive problems.  MS history: Gardner was diagnosed around 2001 when he presented with right-sided numbness and clumsiness and weakness.   Initially, he was on Avonex and then Rebif but had relapses on both of them.  He could not tolerate Tecfidera.   He was started on Tysabri about 1 year ago and his last infusion was at [facility] at 11/08/2015.  He was seen Dr. Gaynelle Adu [facility] and those records were reviewed. He did receive a course of Solu-Medrol March 2015 and another course of Solu-Medrol January 2016 when he presented with left leg weakness and numbness that was new    An MRI of the brain 08/28/2013 personally reviewed  shows old extensive white matter changes in both hemispheres with multiple sclerosis. There were new areas of restricted diffusion without definite enhancement that could be consistent with ongoing demyelination when compared to the MRI from 05/13/2013.   REVIEW OF SYSTEMS: Constitutional: No fevers, chills, sweats, or change in appetite.   Notes fatigue Eyes: No visual changes, double vision, eye pain Ear, nose and throat: No hearing loss, ear pain, nasal congestion, sore throat Cardiovascular: No chest pain, palpitations Respiratory: No shortness of breath at rest or with exertion.   No wheezes GastrointestinaI: No nausea, vomiting, diarrhea, abdominal pain, fecal incontinence Genitourinary: He has hesitancy and frequency.   No UTI's. Musculoskeletal: No neck pain, back pain Integumentary: No rash, pruritus, skin lesions Neurological: as above Psychiatric: No depression at this time.  No anxiety Endocrine: No palpitations, diaphoresis, change in appetite, change in weigh or increased thirst Hematologic/Lymphatic: No anemia, purpura, petechiae. Allergic/Immunologic: No itchy/runny eyes, nasal congestion, recent allergic reactions, rashes  ALLERGIES: Allergies  Allergen Reactions  . Lithium Rash    HOME MEDICATIONS:  Current Outpatient Medications:  .  acetaminophen (TYLENOL) 500 MG tablet, Take 500 mg by mouth 2 (two) times daily., Disp: , Rfl:  .  baclofen (LIORESAL) 20 MG tablet, Take 1 tablet (20 mg total)  by mouth 3 (three) times daily., Disp: 90 tablet, Rfl: 11 .  dalfampridine 10 MG TB12, Take 1 tablet by mouth every 12 hours., Disp: 60 tablet, Rfl: 11 .  diclofenac (VOLTAREN) 75 MG EC tablet, Take 1 tablet (75 mg total) by mouth 2 (two) times daily., Disp: 60 tablet, Rfl: 5 .  gabapentin (NEURONTIN) 600 MG tablet, Take 1 tablet (600 mg total) by mouth 3 (three) times daily., Disp: 90 tablet, Rfl: 11 .  natalizumab (TYSABRI) 300 MG/15ML injection, Inject 15 mLs (300 mg  total) into the vein every 28 (twenty-eight) days., Disp: 15 mL, Rfl: 5 .  tamsulosin (FLOMAX) 0.4 MG CAPS capsule, Take 2 capsules (0.8 mg total) by mouth daily., Disp: 60 capsule, Rfl: 11  PAST MEDICAL HISTORY: Past Medical History:  Diagnosis Date  . Bipolar 1 disorder (HCC)   . Bipolar disorder, unspecified (HCC) 01/03/2013  . MS (multiple sclerosis) (HCC) dx 2006   relapsing-remitting right sided weakness  . Neuromuscular disorder (HCC)    MS    PAST SURGICAL HISTORY: Past Surgical History:  Procedure Laterality Date  . TUMOR REMOVAL  1983   abdomen benign    FAMILY HISTORY: Family History  Adopted: Yes  Problem Relation Age of Onset  . Lupus Mother   . Ataxia Neg Hx   . Chorea Neg Hx   . Dementia Neg Hx   . Mental retardation Neg Hx   . Migraines Neg Hx   . Multiple sclerosis Neg Hx   . Neurofibromatosis Neg Hx   . Neuropathy Neg Hx   . Parkinsonism Neg Hx   . Seizures Neg Hx   . Stroke Neg Hx     SOCIAL HISTORY:  Social History   Socioeconomic History  . Marital status: Single    Spouse name: Not on file  . Number of children: Not on file  . Years of education: Not on file  . Highest education level: Not on file  Occupational History  . Not on file  Tobacco Use  . Smoking status: Current Every Day Smoker    Packs/day: 0.50    Types: Cigarettes  . Smokeless tobacco: Never Used  . Tobacco comment: patient needs to quit   Substance and Sexual Activity  . Alcohol use: Yes    Alcohol/week: 2.0 standard drinks    Types: 2 Cans of beer per week    Comment:  two to three beers a week  . Drug use: Yes    Frequency: 7.0 times per week    Types: Marijuana  . Sexual activity: Yes    Partners: Female  Other Topics Concern  . Not on file  Social History Narrative  . Not on file   Social Determinants of Health   Financial Resource Strain:   . Difficulty of Paying Living Expenses: Not on file  Food Insecurity:   . Worried About Programme researcher, broadcasting/film/video in  the Last Year: Not on file  . Ran Out of Food in the Last Year: Not on file  Transportation Needs:   . Lack of Transportation (Medical): Not on file  . Lack of Transportation (Non-Medical): Not on file  Physical Activity:   . Days of Exercise per Week: Not on file  . Minutes of Exercise per Session: Not on file  Stress:   . Feeling of Stress : Not on file  Social Connections:   . Frequency of Communication with Friends and Family: Not on file  . Frequency of Social Gatherings with Friends  and Family: Not on file  . Attends Religious Services: Not on file  . Active Member of Clubs or Organizations: Not on file  . Attends BankerClub or Organization Meetings: Not on file  . Marital Status: Not on file  Intimate Partner Violence:   . Fear of Current or Ex-Partner: Not on file  . Emotionally Abused: Not on file  . Physically Abused: Not on file  . Sexually Abused: Not on file     PHYSICAL EXAM  Vitals:   06/05/19 1411  BP: 110/70  Pulse: (!) 54  Temp: (!) 97 F (36.1 C)  SpO2: 98%  Weight: 193 lb (87.5 kg)  Height: 5\' 11"  (1.803 m)    Body mass index is 26.92 kg/m.   General: The patient is well-developed and well-nourished and in no acute distress.   Neurologic Exam  Mental status: The patient is alert and oriented x 3 at the time of the examination. The patient has apparent normal recent and remote memory, with an apparently normal attention span and concentration ability.   Speech is normal.  Cranial nerves: Extraocular movements are full.   2+ right APD.     Reduced color vision on the right.     Facial strength and sensation is normal. Trapezius strength is strong.  The tongue is midline, and the patient has symmetric elevation of the soft palate. No obvious hearing deficits are noted.  Motor:  Muscle bulk is normal.   Left rapid altering movements are slow in the hand.  There is increased muscle tone in the left arm and both legs, left greater than right.  Strength is  5/5 in the right arm, 4+/5 in the left arm.  And 4- to 4/5 in the right leg and 4/5 in the left leg.  Sensory: He has reduced vibration sensation in the left leg.   Coordination: Cerebellar testing reveals mildly reduced left finger-nose-finger and he is unable to do heel-to-shin bilaterally.  Gait and station: Station is normal.   The gait is wide and spastic but he can walk without a cane.  Speech is better with a cane.[name].  He cannot tandem.   Romberg is borderline  Reflexes: Deep tendon reflexes are increased with spread at the knees.  He has nonsustained clonus at the ankles      _______________________________________________________ Relapsing remitting multiple sclerosis (HCC) - Plan: Stratify JCV Antibody Test ([facility]), CBC with Differential/Platelets  Spastic gait  Urinary hesitancy  High risk medication use - Plan: Stratify JCV Antibody Test ([facility]), CBC with Differential/Platelets  Numbness   1.   Continue Tysabri.  Check JCV antibody and CBC.. 2.   Continue Ampyra 10 mg twice daily for gait.  Continue baclofen for spasticity 3.   Continue gabapentin for dysesthesia and back pain. 4.   Continue tamsulosin for bladder.     5.   Return in 6 months or sooner if new or worsening neurologic symptoms.    Khadar Monger A. Epimenio FootSater, MD, PhD 06/05/2019, 2:22 PM Certified in Neurology, Clinical Neurophysiology, Sleep Medicine, Pain Medicine and Neuroimaging  [facility] [address], [address] [place], [place] [address] [phone]

## 2019-06-06 LAB — CBC WITH DIFFERENTIAL/PLATELET
Basophils Absolute: 0 10*3/uL (ref 0.0–0.2)
Basos: 0 %
EOS (ABSOLUTE): 0.2 10*3/uL (ref 0.0–0.4)
Eos: 2 %
Hematocrit: 41.8 % (ref 37.5–51.0)
Hemoglobin: 14.9 g/dL (ref 13.0–17.7)
Immature Grans (Abs): 0.1 10*3/uL (ref 0.0–0.1)
Immature Granulocytes: 1 %
Lymphocytes Absolute: 4.6 10*3/uL — ABNORMAL HIGH (ref 0.7–3.1)
Lymphs: 51 %
MCH: 32.5 pg (ref 26.6–33.0)
MCHC: 35.6 g/dL (ref 31.5–35.7)
MCV: 91 fL (ref 79–97)
Monocytes Absolute: 0.7 10*3/uL (ref 0.1–0.9)
Monocytes: 8 %
NRBC: 1 % — ABNORMAL HIGH (ref 0–0)
Neutrophils Absolute: 3.5 10*3/uL (ref 1.4–7.0)
Neutrophils: 38 %
Platelets: 187 10*3/uL (ref 150–450)
RBC: 4.59 x10E6/uL (ref 4.14–5.80)
RDW: 13.7 % (ref 11.6–15.4)
WBC: 9.1 10*3/uL (ref 3.4–10.8)

## 2019-06-09 ENCOUNTER — Telehealth: Payer: Self-pay | Admitting: *Deleted

## 2019-06-09 NOTE — Telephone Encounter (Signed)
Called and spoke with pt about results per Dr. Penumalli note. Pt verbalized understanding.  

## 2019-06-09 NOTE — Telephone Encounter (Signed)
-----   Message from Penni Bombard, MD sent at 06/09/2019  3:07 PM EST ----- Labs stable.

## 2019-06-18 ENCOUNTER — Telehealth: Payer: Self-pay | Admitting: *Deleted

## 2019-06-18 NOTE — Telephone Encounter (Signed)
Labs collected 06/05/2019.  JCV 0.27 (H) Indeterminate  Inhibition Assay: Negative  Printed results provided to Dr. Felecia Shelling for review.  Copy also sent to be scanned to chart.

## 2019-07-03 ENCOUNTER — Other Ambulatory Visit: Payer: Self-pay

## 2019-07-03 ENCOUNTER — Ambulatory Visit (HOSPITAL_COMMUNITY)
Admission: RE | Admit: 2019-07-03 | Discharge: 2019-07-03 | Disposition: A | Discharge status: Home / Self Care | Payer: Medicaid Other | Source: Ambulatory Visit | Attending: Neurology | Admitting: Neurology

## 2019-07-03 DIAGNOSIS — G35 Multiple sclerosis: Secondary | ICD-10-CM | POA: Insufficient documentation

## 2019-07-03 MED ORDER — SODIUM CHLORIDE 0.9 % IV SOLN
INTRAVENOUS | Status: DC | PRN
  Administered 2019-07-03: 08:00:00 250 mL via INTRAVENOUS

## 2019-07-03 MED ORDER — SODIUM CHLORIDE 0.9 % IV SOLN
300.0000 mg | INTRAVENOUS | Status: DC
  Administered 2019-07-03: 300 mg via INTRAVENOUS
  Filled 2019-07-03: qty 15

## 2019-07-03 NOTE — Progress Notes (Signed)
PATIENT CARE CENTER NOTE  Diagnosis:Multiple Sclerosis   Provider:Sater, Richard, MD   Procedure:Tysabri IV   Note:Patient received Tysabri infusion. Tolerated infusion well with no adverse reaction. Vital signs stable. Discharge instructions given. Patient did not want to stay the 1 hour post-infusion. Alert, oriented and ambulatory at discharge. 

## 2019-07-03 NOTE — Discharge Instructions (Signed)
Natalizumab injection What is this medicine? NATALIZUMAB (na ta LIZ you mab) is used to treat relapsing multiple sclerosis. This drug is not a cure. It is also used to treat Crohn's disease. This medicine may be used for other purposes; ask your health care provider or pharmacist if you have questions. COMMON BRAND NAME(S): Tysabri What should I tell my health care provider before I take this medicine? They need to know if you have any of these conditions:  immune system problems  progressive multifocal leukoencephalopathy (PML)  an unusual or allergic reaction to natalizumab, other medicines, foods, dyes, or preservatives  pregnant or trying to get pregnant  breast-feeding How should I use this medicine? This medicine is for infusion into a vein. It is given by a health care professional in a hospital or clinic setting. A special MedGuide will be given to you by the pharmacist with each prescription and refill. Be sure to read this information carefully each time. Talk to your pediatrician regarding the use of this medicine in children. This medicine is not approved for use in children. Overdosage: If you think you have taken too much of this medicine contact a poison control center or emergency room at once. NOTE: This medicine is only for you. Do not share this medicine with others. What if I miss a dose? It is important not to miss your dose. Call your doctor or health care professional if you are unable to keep an appointment. What may interact with this medicine? Do not take this medicine with any of the following medications:  biologic medicines such as adalimumab, certolizumab, etanercept, golimumab, infliximab This medicine may also interact with the following medications:  azathioprine  cyclosporine  interferons  6-mercaptopurine  methotrexate  other medicines that lower your chance of fighting an infection  steroid medicines like prednisone or  cortisone  vaccines This list may not describe all possible interactions. Give your health care provider a list of all the medicines, herbs, non-prescription drugs, or dietary supplements you use. Also tell them if you smoke, drink alcohol, or use illegal drugs. Some items may interact with your medicine. What should I watch for while using this medicine? Your condition will be monitored carefully while you are receiving this medicine. Visit your doctor for regular check ups. Tell your doctor or healthcare professional if your symptoms do not start to get better or if they get worse. Stay away from people who are sick. Call your doctor or health care professional for advice if you get a fever, chills or sore throat, or other symptoms of a cold or flu. Do not treat yourself. In some patients, this medicine may cause a serious brain infection that may cause death. If you have any problems seeing, thinking, speaking, walking, or standing, tell your doctor right away. If you cannot reach your doctor, get urgent medical care. What side effects may I notice from receiving this medicine? Side effects that you should report to your doctor or health care professional as soon as possible:  allergic reactions like skin rash, itching or hives, swelling of the face, lips, or tongue  breathing problems  changes in vision  chest pain  confusion  depressed mood  dizziness  feeling faint; lightheaded; falls  general ill feeling or flu-like symptoms  loss of memory  missed menstrual periods  muscle weakness  problems with balance, talking, or walking  signs and symptoms of liver injury like dark yellow or brown urine; general ill feeling or flu-like symptoms; light-colored   stools; loss of appetite; nausea; right upper belly pain; unusually weak or tired; yellowing of the eyes or skin  suicidal thoughts, mood changes  unusual bruising or bleeding  unusually weak or tired Side effects that  usually do not require medical attention (report to your doctor or health care professional if they continue or are bothersome):  headache  joint pain  muscle cramps  muscle pain  nausea, vomiting  pain, redness, or irritation at site where injected  tiredness This list may not describe all possible side effects. Call your doctor for medical advice about side effects. You may report side effects to FDA at 1-800-FDA-1088. Where should I keep my medicine? This drug is given in a hospital or clinic and will not be stored at home. NOTE: This sheet is a summary. It may not cover all possible information. If you have questions about this medicine, talk to your doctor, pharmacist, or health care provider.  2020 Elsevier/Gold Standard (2018-12-09 13:20:26)  

## 2019-07-21 ENCOUNTER — Telehealth: Payer: Self-pay | Admitting: *Deleted

## 2019-07-21 NOTE — Telephone Encounter (Signed)
Received fax notification from touch prescribing program that pt re-authorized from 07/21/2019-02/20/2020 for Tysabri. Pt enrollment number: [id number]. Account: [facility]. Site auth number: [id number].

## 2019-07-21 NOTE — Telephone Encounter (Signed)
Faxed completed/signed Tysabri pt status report and reauth questionnaire to [facility] touch at [phone]. Received confirmation.  

## 2019-07-28 ENCOUNTER — Ambulatory Visit (HOSPITAL_COMMUNITY)
Admission: RE | Admit: 2019-07-28 | Discharge: 2019-07-28 | Disposition: A | Discharge status: Home / Self Care | Payer: Medicaid Other | Source: Ambulatory Visit | Attending: Neurology | Admitting: Neurology

## 2019-07-28 ENCOUNTER — Other Ambulatory Visit: Payer: Self-pay

## 2019-07-28 DIAGNOSIS — G35 Multiple sclerosis: Secondary | ICD-10-CM | POA: Diagnosis not present

## 2019-07-28 MED ORDER — SODIUM CHLORIDE 0.9 % IV SOLN
300.0000 mg | INTRAVENOUS | Status: DC
  Administered 2019-07-28: 09:00:00 300 mg via INTRAVENOUS
  Filled 2019-07-28: qty 15

## 2019-07-28 MED ORDER — SODIUM CHLORIDE 0.9 % IV SOLN
INTRAVENOUS | Status: DC | PRN
  Administered 2019-07-28: 09:00:00 250 mL via INTRAVENOUS

## 2019-07-28 NOTE — Discharge Instructions (Signed)
Natalizumab injection What is this medicine? NATALIZUMAB (na ta LIZ you mab) is used to treat relapsing multiple sclerosis. This drug is not a cure. It is also used to treat Crohn's disease. This medicine may be used for other purposes; ask your health care provider or pharmacist if you have questions. COMMON BRAND NAME(S): Tysabri What should I tell my health care provider before I take this medicine? They need to know if you have any of these conditions:  immune system problems  progressive multifocal leukoencephalopathy (PML)  an unusual or allergic reaction to natalizumab, other medicines, foods, dyes, or preservatives  pregnant or trying to get pregnant  breast-feeding How should I use this medicine? This medicine is for infusion into a vein. It is given by a health care professional in a hospital or clinic setting. A special MedGuide will be given to you by the pharmacist with each prescription and refill. Be sure to read this information carefully each time. Talk to your pediatrician regarding the use of this medicine in children. This medicine is not approved for use in children. Overdosage: If you think you have taken too much of this medicine contact a poison control center or emergency room at once. NOTE: This medicine is only for you. Do not share this medicine with others. What if I miss a dose? It is important not to miss your dose. Call your doctor or health care professional if you are unable to keep an appointment. What may interact with this medicine? Do not take this medicine with any of the following medications:  biologic medicines such as adalimumab, certolizumab, etanercept, golimumab, infliximab This medicine may also interact with the following medications:  azathioprine  cyclosporine  interferons  6-mercaptopurine  methotrexate  other medicines that lower your chance of fighting an infection  steroid medicines like prednisone or  cortisone  vaccines This list may not describe all possible interactions. Give your health care provider a list of all the medicines, herbs, non-prescription drugs, or dietary supplements you use. Also tell them if you smoke, drink alcohol, or use illegal drugs. Some items may interact with your medicine. What should I watch for while using this medicine? Your condition will be monitored carefully while you are receiving this medicine. Visit your doctor for regular check ups. Tell your doctor or healthcare professional if your symptoms do not start to get better or if they get worse. Stay away from people who are sick. Call your doctor or health care professional for advice if you get a fever, chills or sore throat, or other symptoms of a cold or flu. Do not treat yourself. In some patients, this medicine may cause a serious brain infection that may cause death. If you have any problems seeing, thinking, speaking, walking, or standing, tell your doctor right away. If you cannot reach your doctor, get urgent medical care. What side effects may I notice from receiving this medicine? Side effects that you should report to your doctor or health care professional as soon as possible:  allergic reactions like skin rash, itching or hives, swelling of the face, lips, or tongue  breathing problems  changes in vision  chest pain  confusion  depressed mood  dizziness  feeling faint; lightheaded; falls  general ill feeling or flu-like symptoms  loss of memory  missed menstrual periods  muscle weakness  problems with balance, talking, or walking  signs and symptoms of liver injury like dark yellow or brown urine; general ill feeling or flu-like symptoms; light-colored   stools; loss of appetite; nausea; right upper belly pain; unusually weak or tired; yellowing of the eyes or skin  suicidal thoughts, mood changes  unusual bruising or bleeding  unusually weak or tired Side effects that  usually do not require medical attention (report to your doctor or health care professional if they continue or are bothersome):  headache  joint pain  muscle cramps  muscle pain  nausea, vomiting  pain, redness, or irritation at site where injected  tiredness This list may not describe all possible side effects. Call your doctor for medical advice about side effects. You may report side effects to FDA at 1-800-FDA-1088. Where should I keep my medicine? This drug is given in a hospital or clinic and will not be stored at home. NOTE: This sheet is a summary. It may not cover all possible information. If you have questions about this medicine, talk to your doctor, pharmacist, or health care provider.  2020 Elsevier/Gold Standard (2018-12-09 13:20:26)  

## 2019-07-28 NOTE — Progress Notes (Signed)
PATIENT CARE CENTER NOTE  Diagnosis:Multiple Sclerosis   Provider:Sater, Gerlene Burdock, MD   Procedure:Tysabri IV   Note:Patient received Tysabri infusion. Tolerated infusion well with no adverse reaction. Vital signs stable. Discharge instructions given. Patient did not want to stay the 1 hour post-infusion. Alert, oriented and ambulatory at discharge.

## 2019-08-04 ENCOUNTER — Telehealth: Payer: Self-pay | Admitting: Neurology

## 2019-08-04 DIAGNOSIS — G35 Multiple sclerosis: Secondary | ICD-10-CM

## 2019-08-04 NOTE — Telephone Encounter (Signed)
Patient called stating he would like to get a brain mri done and states he thinks something is wrong and knows he has lesions but wanted to check again.   Please follow up.

## 2019-08-04 NOTE — Telephone Encounter (Signed)
Since having more symptoms, we can do brain MRi with/without

## 2019-08-04 NOTE — Telephone Encounter (Signed)
I called and spoke with pt to get more information. He is more forgetful. Unable to think clearly. Feels sx started to get worse about two weeks ago. He reports he "does not feel right". Denies any vision issues or any new numbness/tingling. Denies any signs of infection. Last Tysabri infusion 07/28/19. Advised I will send message to MD for further recommendation. Advised we will call back.

## 2019-08-04 NOTE — Telephone Encounter (Signed)
Called pt back. Relayed Dr. Bonnita Hollow message. I placed order for MRI and advised they will have to obtain insurance auth first and then they will call to get him scheduled. He verbalized understanding.

## 2019-08-25 ENCOUNTER — Other Ambulatory Visit: Payer: Self-pay

## 2019-08-25 ENCOUNTER — Ambulatory Visit (HOSPITAL_COMMUNITY)
Admission: RE | Admit: 2019-08-25 | Discharge: 2019-08-25 | Disposition: A | Discharge status: Home / Self Care | Payer: Medicaid Other | Source: Ambulatory Visit | Attending: Neurology | Admitting: Neurology

## 2019-08-25 DIAGNOSIS — G35 Multiple sclerosis: Secondary | ICD-10-CM | POA: Insufficient documentation

## 2019-08-25 MED ORDER — SODIUM CHLORIDE 0.9 % IV SOLN
INTRAVENOUS | Status: DC | PRN
  Administered 2019-08-25: 250 mL via INTRAVENOUS

## 2019-08-25 MED ORDER — SODIUM CHLORIDE 0.9 % IV SOLN
300.0000 mg | INTRAVENOUS | Status: DC
  Administered 2019-08-25: 300 mg via INTRAVENOUS
  Filled 2019-08-25: qty 15

## 2019-08-25 NOTE — Progress Notes (Addendum)
0800 [facility] Tysabri infusion. Pt ambulated to recliner, vital signs stable, IV started, NS running at [facility], awaiting meds from pharmacy.   0825 Tysabri infusion started, pt tolerating well.   0940 Infusion completed, vital signs stable, patient declined waiting an hour for observation of medication side effects. Pt understands risks without assistance. RN reviewed discharge paperwork with patient, all questions answered, pt understands without assistance. IV removed without complications, pt ambulated our of the center.

## 2019-08-25 NOTE — Discharge Instructions (Signed)
Natalizumab injection What is this medicine? NATALIZUMAB (na ta LIZ you mab) is used to treat relapsing multiple sclerosis. This drug is not a cure. It is also used to treat Crohn's disease. This medicine may be used for other purposes; ask your health care provider or pharmacist if you have questions. COMMON BRAND NAME(S): Tysabri What should I tell my health care provider before I take this medicine? They need to know if you have any of these conditions:  immune system problems  progressive multifocal leukoencephalopathy (PML)  an unusual or allergic reaction to natalizumab, other medicines, foods, dyes, or preservatives  pregnant or trying to get pregnant  breast-feeding How should I use this medicine? This medicine is for infusion into a vein. It is given by a health care professional in a hospital or clinic setting. A special MedGuide will be given to you by the pharmacist with each prescription and refill. Be sure to read this information carefully each time. Talk to your pediatrician regarding the use of this medicine in children. This medicine is not approved for use in children. Overdosage: If you think you have taken too much of this medicine contact a poison control center or emergency room at once. NOTE: This medicine is only for you. Do not share this medicine with others. What if I miss a dose? It is important not to miss your dose. Call your doctor or health care professional if you are unable to keep an appointment. What may interact with this medicine? Do not take this medicine with any of the following medications:  biologic medicines such as adalimumab, certolizumab, etanercept, golimumab, infliximab This medicine may also interact with the following medications:  azathioprine  cyclosporine  interferons  6-mercaptopurine  methotrexate  other medicines that lower your chance of fighting an infection  steroid medicines like prednisone or  cortisone  vaccines This list may not describe all possible interactions. Give your health care provider a list of all the medicines, herbs, non-prescription drugs, or dietary supplements you use. Also tell them if you smoke, drink alcohol, or use illegal drugs. Some items may interact with your medicine. What should I watch for while using this medicine? Your condition will be monitored carefully while you are receiving this medicine. Visit your doctor for regular check ups. Tell your doctor or healthcare professional if your symptoms do not start to get better or if they get worse. Stay away from people who are sick. Call your doctor or health care professional for advice if you get a fever, chills or sore throat, or other symptoms of a cold or flu. Do not treat yourself. In some patients, this medicine may cause a serious brain infection that may cause death. If you have any problems seeing, thinking, speaking, walking, or standing, tell your doctor right away. If you cannot reach your doctor, get urgent medical care. What side effects may I notice from receiving this medicine? Side effects that you should report to your doctor or health care professional as soon as possible:  allergic reactions like skin rash, itching or hives, swelling of the face, lips, or tongue  breathing problems  changes in vision  chest pain  confusion  depressed mood  dizziness  feeling faint; lightheaded; falls  general ill feeling or flu-like symptoms  loss of memory  missed menstrual periods  muscle weakness  problems with balance, talking, or walking  signs and symptoms of liver injury like dark yellow or brown urine; general ill feeling or flu-like symptoms; light-colored   stools; loss of appetite; nausea; right upper belly pain; unusually weak or tired; yellowing of the eyes or skin  suicidal thoughts, mood changes  unusual bruising or bleeding  unusually weak or tired Side effects that  usually do not require medical attention (report to your doctor or health care professional if they continue or are bothersome):  headache  joint pain  muscle cramps  muscle pain  nausea, vomiting  pain, redness, or irritation at site where injected  tiredness This list may not describe all possible side effects. Call your doctor for medical advice about side effects. You may report side effects to FDA at 1-800-FDA-1088. Where should I keep my medicine? This drug is given in a hospital or clinic and will not be stored at home. NOTE: This sheet is a summary. It may not cover all possible information. If you have questions about this medicine, talk to your doctor, pharmacist, or health care provider.  2020 Elsevier/Gold Standard (2018-12-09 13:20:26)  

## 2019-08-30 ENCOUNTER — Ambulatory Visit
Admission: RE | Admit: 2019-08-30 | Discharge: 2019-08-30 | Disposition: A | Discharge status: Home / Self Care | Payer: Medicaid Other | Source: Ambulatory Visit | Attending: Neurology | Admitting: Neurology

## 2019-08-30 DIAGNOSIS — G35 Multiple sclerosis: Secondary | ICD-10-CM

## 2019-08-30 MED ORDER — GADOBENATE DIMEGLUMINE 529 MG/ML IV SOLN
18.0000 mL | Freq: Once | INTRAVENOUS | Status: AC | PRN
  Administered 2019-08-30: 18 mL via INTRAVENOUS

## 2019-09-04 ENCOUNTER — Ambulatory Visit: Payer: Medicaid Other | Attending: Internal Medicine

## 2019-09-04 DIAGNOSIS — Z23 Encounter for immunization: Secondary | ICD-10-CM

## 2019-09-04 NOTE — Progress Notes (Signed)
   Covid-19 Vaccination Clinic  Name:  Bradley Brandt    MRN: [id number] DOB: 1981-02-01  09/04/2019  Bradley Brandt was observed post Covid-19 immunization for 15 minutes without incident. He was provided with Vaccine Information Sheet and instruction to access the V-Safe system.   Bradley Brandt was instructed to call 911 with any severe reactions post vaccine: [name] Difficulty breathing  . Swelling of face and throat  . A fast heartbeat  . A bad rash all over body  . Dizziness and weakness   Immunizations Administered    Name Date Dose VIS Date Route   Moderna COVID-19 Vaccine 09/04/2019 12:53 PM 0.5 mL 05/20/2019 Intramuscular   Manufacturer: Moderna   Lot: [id number]   NDC: 73710-626-94

## 2019-09-09 ENCOUNTER — Ambulatory Visit: Payer: Medicaid Other

## 2019-10-06 ENCOUNTER — Other Ambulatory Visit: Payer: Self-pay

## 2019-10-06 ENCOUNTER — Ambulatory Visit (HOSPITAL_COMMUNITY)
Admission: RE | Admit: 2019-10-06 | Discharge: 2019-10-06 | Disposition: A | Discharge status: Home / Self Care | Payer: Medicaid Other | Source: Ambulatory Visit | Attending: Neurology | Admitting: Neurology

## 2019-10-06 DIAGNOSIS — G35 Multiple sclerosis: Secondary | ICD-10-CM | POA: Insufficient documentation

## 2019-10-06 MED ORDER — SODIUM CHLORIDE 0.9 % IV SOLN
300.0000 mg | INTRAVENOUS | Status: DC
  Administered 2019-10-06: 300 mg via INTRAVENOUS
  Filled 2019-10-06: qty 15

## 2019-10-06 MED ORDER — SODIUM CHLORIDE 0.9 % IV SOLN
INTRAVENOUS | Status: DC | PRN
  Administered 2019-10-06: 250 mL via INTRAVENOUS

## 2019-10-06 NOTE — Progress Notes (Signed)
PATIENT CARE CENTER NOTE  Diagnosis:Multiple Sclerosis   Provider:Sater, Gerlene Burdock, MD   Procedure:Tysabri IV   Note:Patient received Tysabri infusion. Tolerated infusion well with no adverse reaction. Vital signs stable. Discharge instructions given. Patient did not want to stay the 1 hour post-infusion. Alert, oriented and ambulatory at discharge.

## 2019-10-06 NOTE — Discharge Instructions (Signed)
Natalizumab injection What is this medicine? NATALIZUMAB (na ta LIZ you mab) is used to treat relapsing multiple sclerosis. This drug is not a cure. It is also used to treat Crohn's disease. This medicine may be used for other purposes; ask your health care provider or pharmacist if you have questions. COMMON BRAND NAME(S): Tysabri What should I tell my health care provider before I take this medicine? They need to know if you have any of these conditions:  immune system problems  progressive multifocal leukoencephalopathy (PML)  an unusual or allergic reaction to natalizumab, other medicines, foods, dyes, or preservatives  pregnant or trying to get pregnant  breast-feeding How should I use this medicine? This medicine is for infusion into a vein. It is given by a health care professional in a hospital or clinic setting. A special MedGuide will be given to you by the pharmacist with each prescription and refill. Be sure to read this information carefully each time. Talk to your pediatrician regarding the use of this medicine in children. This medicine is not approved for use in children. Overdosage: If you think you have taken too much of this medicine contact a poison control center or emergency room at once. NOTE: This medicine is only for you. Do not share this medicine with others. What if I miss a dose? It is important not to miss your dose. Call your doctor or health care professional if you are unable to keep an appointment. What may interact with this medicine? Do not take this medicine with any of the following medications:  biologic medicines such as adalimumab, certolizumab, etanercept, golimumab, infliximab This medicine may also interact with the following medications:  azathioprine  cyclosporine  interferons  6-mercaptopurine  methotrexate  other medicines that lower your chance of fighting an infection  steroid medicines like prednisone or  cortisone  vaccines This list may not describe all possible interactions. Give your health care provider a list of all the medicines, herbs, non-prescription drugs, or dietary supplements you use. Also tell them if you smoke, drink alcohol, or use illegal drugs. Some items may interact with your medicine. What should I watch for while using this medicine? Your condition will be monitored carefully while you are receiving this medicine. Visit your doctor for regular check ups. Tell your doctor or healthcare professional if your symptoms do not start to get better or if they get worse. Stay away from people who are sick. Call your doctor or health care professional for advice if you get a fever, chills or sore throat, or other symptoms of a cold or flu. Do not treat yourself. In some patients, this medicine may cause a serious brain infection that may cause death. If you have any problems seeing, thinking, speaking, walking, or standing, tell your doctor right away. If you cannot reach your doctor, get urgent medical care. What side effects may I notice from receiving this medicine? Side effects that you should report to your doctor or health care professional as soon as possible:  allergic reactions like skin rash, itching or hives, swelling of the face, lips, or tongue  breathing problems  changes in vision  chest pain  confusion  depressed mood  dizziness  feeling faint; lightheaded; falls  general ill feeling or flu-like symptoms  loss of memory  missed menstrual periods  muscle weakness  problems with balance, talking, or walking  signs and symptoms of liver injury like dark yellow or brown urine; general ill feeling or flu-like symptoms; light-colored   stools; loss of appetite; nausea; right upper belly pain; unusually weak or tired; yellowing of the eyes or skin  suicidal thoughts, mood changes  unusual bruising or bleeding  unusually weak or tired Side effects that  usually do not require medical attention (report to your doctor or health care professional if they continue or are bothersome):  headache  joint pain  muscle cramps  muscle pain  nausea, vomiting  pain, redness, or irritation at site where injected  tiredness This list may not describe all possible side effects. Call your doctor for medical advice about side effects. You may report side effects to FDA at 1-800-FDA-1088. Where should I keep my medicine? This drug is given in a hospital or clinic and will not be stored at home. NOTE: This sheet is a summary. It may not cover all possible information. If you have questions about this medicine, talk to your doctor, pharmacist, or health care provider.  2020 Elsevier/Gold Standard (2018-12-09 13:20:26)  

## 2019-10-07 ENCOUNTER — Ambulatory Visit: Payer: Medicaid Other | Attending: Family

## 2019-10-07 DIAGNOSIS — Z23 Encounter for immunization: Secondary | ICD-10-CM

## 2019-10-07 NOTE — Progress Notes (Signed)
   Covid-19 Vaccination Clinic  Name:  Bradley Brandt    MRN: [id number] DOB: 1981/02/25  10/07/2019  [name] was observed post Covid-19 immunization for 15 minutes without incident. He was provided with Vaccine Information Sheet and instruction to access the V-Safe system.   [name] was instructed to call 911 with any severe reactions post vaccine: [name] Difficulty breathing  . Swelling of face and throat  . A fast heartbeat  . A bad rash all over body  . Dizziness and weakness   Immunizations Administered    Name Date Dose VIS Date Route   Moderna COVID-19 Vaccine 10/07/2019  1:03 PM 0.5 mL 05/2019 Intramuscular   Manufacturer: Moderna   Lot: [id number]   NDC: 13086-578-46

## 2019-11-03 ENCOUNTER — Ambulatory Visit (HOSPITAL_COMMUNITY): Payer: Medicaid Other

## 2019-11-12 ENCOUNTER — Ambulatory Visit (HOSPITAL_COMMUNITY): Payer: Medicaid Other

## 2019-11-12 ENCOUNTER — Telehealth: Payer: Self-pay | Admitting: Neurology

## 2019-11-12 NOTE — Telephone Encounter (Signed)
Faxed updated order as requested. Received fax confirmation.

## 2019-11-12 NOTE — Telephone Encounter (Signed)
[facility] has called asking for an order to be faxed to them re: pt's Tysabri.  If there are questions he can be reached at [phone] please send the order to fax# [phone]

## 2019-11-19 ENCOUNTER — Other Ambulatory Visit: Payer: Self-pay

## 2019-11-19 ENCOUNTER — Ambulatory Visit (HOSPITAL_COMMUNITY)
Admission: RE | Admit: 2019-11-19 | Discharge: 2019-11-19 | Disposition: A | Discharge status: Home / Self Care | Payer: Medicaid Other | Source: Ambulatory Visit | Attending: Neurology | Admitting: Neurology

## 2019-11-19 DIAGNOSIS — G35 Multiple sclerosis: Secondary | ICD-10-CM | POA: Diagnosis not present

## 2019-11-19 MED ORDER — SODIUM CHLORIDE 0.9 % IV SOLN
300.0000 mg | INTRAVENOUS | Status: DC
  Administered 2019-11-19: 300 mg via INTRAVENOUS
  Filled 2019-11-19: qty 15

## 2019-11-19 MED ORDER — SODIUM CHLORIDE 0.9 % IV SOLN
INTRAVENOUS | Status: DC | PRN
  Administered 2019-11-19: 250 mL via INTRAVENOUS

## 2019-11-19 NOTE — Progress Notes (Signed)
Patient receivedIV Tysabrias ordered by Sater, Richard MD.Declined 1 hour post infusion observation.Tolerated well, vitals stable, discharge instructions given, verbalized understanding. Patient alert, oriented and ambulatory at the time of discharge 

## 2019-11-19 NOTE — Discharge Instructions (Signed)
Natalizumab injection What is this medicine? NATALIZUMAB (na ta LIZ you mab) is used to treat relapsing multiple sclerosis. This drug is not a cure. It is also used to treat Crohn's disease. This medicine may be used for other purposes; ask your health care provider or pharmacist if you have questions. COMMON BRAND NAME(S): Tysabri What should I tell my health care provider before I take this medicine? They need to know if you have any of these conditions:  immune system problems  progressive multifocal leukoencephalopathy (PML)  an unusual or allergic reaction to natalizumab, other medicines, foods, dyes, or preservatives  pregnant or trying to get pregnant  breast-feeding How should I use this medicine? This medicine is for infusion into a vein. It is given by a health care professional in a hospital or clinic setting. A special MedGuide will be given to you by the pharmacist with each prescription and refill. Be sure to read this information carefully each time. Talk to your pediatrician regarding the use of this medicine in children. This medicine is not approved for use in children. Overdosage: If you think you have taken too much of this medicine contact a poison control center or emergency room at once. NOTE: This medicine is only for you. Do not share this medicine with others. What if I miss a dose? It is important not to miss your dose. Call your doctor or health care professional if you are unable to keep an appointment. What may interact with this medicine? Do not take this medicine with any of the following medications:  biologic medicines such as adalimumab, certolizumab, etanercept, golimumab, infliximab This medicine may also interact with the following medications:  azathioprine  cyclosporine  interferons  6-mercaptopurine  methotrexate  other medicines that lower your chance of fighting an infection  steroid medicines like prednisone or  cortisone  vaccines This list may not describe all possible interactions. Give your health care provider a list of all the medicines, herbs, non-prescription drugs, or dietary supplements you use. Also tell them if you smoke, drink alcohol, or use illegal drugs. Some items may interact with your medicine. What should I watch for while using this medicine? Your condition will be monitored carefully while you are receiving this medicine. Visit your doctor for regular check ups. Tell your doctor or healthcare professional if your symptoms do not start to get better or if they get worse. Stay away from people who are sick. Call your doctor or health care professional for advice if you get a fever, chills or sore throat, or other symptoms of a cold or flu. Do not treat yourself. In some patients, this medicine may cause a serious brain infection that may cause death. If you have any problems seeing, thinking, speaking, walking, or standing, tell your doctor right away. If you cannot reach your doctor, get urgent medical care. What side effects may I notice from receiving this medicine? Side effects that you should report to your doctor or health care professional as soon as possible:  allergic reactions like skin rash, itching or hives, swelling of the face, lips, or tongue  breathing problems  changes in vision  chest pain  confusion  depressed mood  dizziness  feeling faint; lightheaded; falls  general ill feeling or flu-like symptoms  loss of memory  missed menstrual periods  muscle weakness  problems with balance, talking, or walking  signs and symptoms of liver injury like dark yellow or brown urine; general ill feeling or flu-like symptoms; light-colored   stools; loss of appetite; nausea; right upper belly pain; unusually weak or tired; yellowing of the eyes or skin  suicidal thoughts, mood changes  unusual bruising or bleeding  unusually weak or tired Side effects that  usually do not require medical attention (report to your doctor or health care professional if they continue or are bothersome):  headache  joint pain  muscle cramps  muscle pain  nausea, vomiting  pain, redness, or irritation at site where injected  tiredness This list may not describe all possible side effects. Call your doctor for medical advice about side effects. You may report side effects to FDA at 1-800-FDA-1088. Where should I keep my medicine? This drug is given in a hospital or clinic and will not be stored at home. NOTE: This sheet is a summary. It may not cover all possible information. If you have questions about this medicine, talk to your doctor, pharmacist, or health care provider.  2020 Elsevier/Gold Standard (2018-12-09 13:20:26)  

## 2019-12-08 ENCOUNTER — Encounter: Payer: Self-pay | Admitting: Neurology

## 2019-12-08 ENCOUNTER — Telehealth: Payer: Self-pay

## 2019-12-08 ENCOUNTER — Ambulatory Visit: Payer: Medicaid Other | Admitting: Neurology

## 2019-12-08 VITALS — BP 106/70 | HR 60 | Ht 71.0 in | Wt 197.0 lb

## 2019-12-08 DIAGNOSIS — G35 Multiple sclerosis: Secondary | ICD-10-CM | POA: Diagnosis not present

## 2019-12-08 DIAGNOSIS — R261 Paralytic gait: Secondary | ICD-10-CM | POA: Diagnosis not present

## 2019-12-08 DIAGNOSIS — E559 Vitamin D deficiency, unspecified: Secondary | ICD-10-CM

## 2019-12-08 DIAGNOSIS — R3911 Hesitancy of micturition: Secondary | ICD-10-CM

## 2019-12-08 DIAGNOSIS — H469 Unspecified optic neuritis: Secondary | ICD-10-CM

## 2019-12-08 DIAGNOSIS — Z79899 Other long term (current) drug therapy: Secondary | ICD-10-CM | POA: Diagnosis not present

## 2019-12-08 NOTE — Telephone Encounter (Signed)
[facility] form filled out and put in [facility] box for pick up.

## 2019-12-08 NOTE — Progress Notes (Signed)
[name]  PATIENT: Bradley Brandt DOB: [birth year]  REFERRING DOCTOR OR PCP:  Aaron Edelman SOURCE: patient, records form Dr. George Hugh, MRI reports and images on PACS  _________________________________   HISTORICAL  CHIEF COMPLAINT:  Chief Complaint  Patient presents with  . Follow-up    MS follow up room 7 pt with cane he fell two weeks ago pt alone    HISTORY OF PRESENT ILLNESS:  Bradley Brandt is a 39 y.o.  man with relapsing remitting multiple sclerosis.     Update 12/08/2019:   He is on Tysabri and gets infusions at [facility].   He tolerates it well.   Last JCV Ab was negative at 0.27.    He has reduced gait due to balance, strength and spasticity.    He fell last week landing on his left elbow.  There was no head injury.   He is on baclofen for spasticity and dalfampridine for the walking.   Vision is doing well and he has new glasses.    He has urinary hesitancy helped by tamsulosin.  No bladder infections.     He feels fatigued.   He has some apathy but does not feel depressed.    He sleeps ok most nights.    He denies  anxiety.    He notes cognition is about the same.  Focus is sometimes poor.    Update 06/05/2019: He is on Tysabri and tolerates it well.     He has been JCV negative (0.12 12/05/2018).   No exacerbations or new MS symptoms.   He has a poor gait and has had occasional falls, last one 2 days ago.   He reports weakness in his legs and also notes spasticity, helped by baclofen.    He feels the Ampyra helps the walking some.    He feels the vision is about the same.   He will be getting new glasses soon.    He notes urinary hesitancy only partially helped by tamsulosin.  He feels his fatigue is doing ok.      He thinks he may have had Covid as he was sick a few months ago for 3 days but never got tested.   We discussed Covid risks and MS.  Tysabri should not add much risk.  Update 12/03/2018 (virtual): He is on Tysabri as his  disease modifying therapy.  He tolerates it well.  He has been JCV antibody negative.  His last test was about 8 months ago.  He has not had any exacerbations on the medication.  He does not note any worsening of any of his symptoms.  His main issue continues to be a reduced gait.  He has right much greater than left spasticity and weakness.  Baclofen helps the spasticity some.  This involves his leg more than the arm.  He uses a cane.  He denies any recent falls.  He stumbles some.  Dalfampridine has helped the gait.  He also has some dysesthetic pain in the right leg and some back pain.  Gabapentin has helped.  He has urinary hesitancy with some benefit from tamsulosin.  He denies any difficulties with his vision.  He notes some fatigue but feels it is not too troubling.  He has some insomnia but generally sleeps well most nights.  He denies any change with cognition.  He has some word finding difficulties at times and reduced focus and attention..   Mood has done well.  Observations/Objective:  Assessment and Plan: 1. Relapsing remitting multiple sclerosis (HCC)   2. High risk medication use   3. Spastic gait   4. Optic neuritis   5. Urinary hesitancy   6. Vitamin D deficiency      Update 06/03/2018: He is on Tysabri and he tolerates it well.  He denies any new symptoms.   He has a poor gait due to right > left weakness and reduced balance.   He uses a cane.    He notes some numbness in the right leg.   He has lower back pain.  He takes gabapentin and baclofen with some benefit.   He is out of his NSAID.     Vision is stable.   He denies diplopia, vision loss or color loss.  He wears glasses.   He has some urinary urgency and occasionally has incontinence in the early morning while in bed.   He is on tamsulosin as he has hesitancy.  He is also on baclofen for muscle spasms.      He sleeps ok most nights.    Mood is doing well off fluoxetine.  He has some fatigue and his legs tire out easily.    He has some word finding difficulties and reduced focus/attention.   Update 12/31/2017: He is out of prison and back home.      He is on Tysabri and tolerates it well (infusions are at [facility])   He denies any recent exacerbation.  He uses a walker for his gait though will furniture surf some in the house.   He has fallen a few times.    His right leg is stronger than the left leg.     Arm strength is mildly reduced on th e left and coordination is poor in that arm.   He has tingling dysesthesia in the left leg.   He feels his vision is worse but he did better with a new pair of glasses.       He has trouble emptying his bladder but has no incontinence.   Flomax has helped.     Mood is doing about the same.    He does better with Prozac for mood and Seroquel for anxiety and sleep.    Insomnia bothers him some nights.    He has fatigue. He feels cognition has been stable.      Update 08/02/2017: He is on Tysabri. He tolerates it well. His last JCV antibody was negative and 0.12.     He reports vision is worse on the right and his ophthalmologist told him to come back to see me.   Mr. Record states that he can't see well without his glasses but is seeing ok with them when he uses both eyes.[name]   His right eye worsened sometime over the past 3 months (started while still in prison).  He is uncertain if the vision worsened rapidly or over a longer time span. He feels his vision is about the same now as a couple weeks ago.  He continues to have trouble walking and can go about 100 feet with a cane without stopping.    Leg strength is symmetric.  He has some spasticity.     He notes pins and needles dysesthesia in his legs.   Arms have no weakness or numbness.      Bladder hesitancy is better on Flomax but he has occasional incontinence at night.     Fatigue is moderate many days.  He feels mood is doing ok.   He used to see psychiatry.   Update 07/03/2017: He is on Tysabri and he tolerated it  well.   He has not had any exacerbation recently.   He notes reduced gait and can walk abput 50-100 feet without stopping.    He feels both legs are weak and left = right.    He notes spasticity in both legs.   He notes a pins/needles tingling dysesthesia in both legs.   His arms are strong.    He has trouble with urinary hesitancy and often needs to push to empty.    He has not had any UTI.    Flomax has helped.        He notes increased fatigue but also has trouble falling asleep.        He has finished his jail term and is back home.    From 08/23/2016: He is currently on Tysabri. He tolerates it well and feels that his MS has done better on Tysabri than any other medication. He also notes that it helps his fatigue. He reports being JCV antibody negative  Gait/strength/sensation: He has difficulty with his gait and walk without a cane. He notes spasticity in both legs. He feels that there is chronic mild right leg weakness but that the left leg weakness got worse more recently.   He notes some tingling in the feet but does not feel that it is painful.   Baclofen has helped the spasticity.   Mirtazepine helped his sleep but he didn't feel he thought as clearly the next day and he prefers not to take.     Vision: He feels vision is doing ok with newer glasses. He has not noted changes in color vision. He notes no diplopia. .   Bladder: He notes increased urinary frequency but no incontinence.  Hesitancy is better on Flomax.     Fatigue/sleep: He notes fatigue and sleepiness in the afternoons that improved on Tysabri.  Sleeps well at night  Mood/cognition: He denies any depression or anxiety now but had some in the past.   He has been diagnosed with bipolar disease and is on a psychiatric medicine but does not know the name.. Notes some decreased focus and attention but notes no major cognitive problems.  MS history: Victormanuel was diagnosed around 2001 when he presented with right-sided  numbness and clumsiness and weakness.   Initially, he was on Avonex and then Rebif but had relapses on both of them.  He could not tolerate Tecfidera.   He was started on Tysabri about 1 year ago and his last infusion was at [facility] at 11/08/2015.  He was seen Dr. Gaynelle Adu [facility] and those records were reviewed. He did receive a course of Solu-Medrol March 2015 and another course of Solu-Medrol January 2016 when he presented with left leg weakness and numbness that was new    An MRI of the brain 08/28/2013 personally reviewed shows old extensive white matter changes in both hemispheres with multiple sclerosis. There were new areas of restricted diffusion without definite enhancement that could be consistent with ongoing demyelination when compared to the MRI from 05/13/2013.   REVIEW OF SYSTEMS: Constitutional: No fevers, chills, sweats, or change in appetite.   Notes fatigue Eyes: No visual changes, double vision, eye pain Ear, nose and throat: No hearing loss, ear pain, nasal congestion, sore throat Cardiovascular: No chest pain, palpitations Respiratory: No shortness of breath at  rest or with exertion.   No wheezes GastrointestinaI: No nausea, vomiting, diarrhea, abdominal pain, fecal incontinence Genitourinary: He has hesitancy and frequency.   No UTI's. Musculoskeletal: No neck pain, back pain Integumentary: No rash, pruritus, skin lesions Neurological: as above Psychiatric: No depression at this time.  No anxiety Endocrine: No palpitations, diaphoresis, change in appetite, change in weigh or increased thirst Hematologic/Lymphatic: No anemia, purpura, petechiae. Allergic/Immunologic: No itchy/runny eyes, nasal congestion, recent allergic reactions, rashes  ALLERGIES: Allergies  Allergen Reactions  . Lithium Rash    HOME MEDICATIONS:  Current Outpatient Medications:  .  acetaminophen (TYLENOL) 500 MG tablet, Take 500 mg by mouth 2 (two) times daily.,  Disp: , Rfl:  .  baclofen (LIORESAL) 20 MG tablet, Take 1 tablet (20 mg total) by mouth 3 (three) times daily., Disp: 90 tablet, Rfl: 11 .  dalfampridine 10 MG TB12, Take 1 tablet by mouth every 12 hours., Disp: 60 tablet, Rfl: 11 .  diclofenac (VOLTAREN) 75 MG EC tablet, Take 1 tablet (75 mg total) by mouth 2 (two) times daily., Disp: 60 tablet, Rfl: 5 .  gabapentin (NEURONTIN) 600 MG tablet, Take 1 tablet (600 mg total) by mouth 3 (three) times daily., Disp: 90 tablet, Rfl: 11 .  natalizumab (TYSABRI) 300 MG/15ML injection, Inject 15 mLs (300 mg total) into the vein every 28 (twenty-eight) days., Disp: 15 mL, Rfl: 5 .  tamsulosin (FLOMAX) 0.4 MG CAPS capsule, Take 2 capsules (0.8 mg total) by mouth daily., Disp: 60 capsule, Rfl: 11  PAST MEDICAL HISTORY: Past Medical History:  Diagnosis Date  . Bipolar 1 disorder (HCC)   . Bipolar disorder, unspecified (HCC) 01/03/2013  . MS (multiple sclerosis) (HCC) dx 2006   relapsing-remitting right sided weakness  . Neuromuscular disorder (HCC)    MS    PAST SURGICAL HISTORY: Past Surgical History:  Procedure Laterality Date  . TUMOR REMOVAL  1983   abdomen benign    FAMILY HISTORY: Family History  Adopted: Yes  Problem Relation Age of Onset  . Lupus Mother   . Ataxia Neg Hx   . Chorea Neg Hx   . Dementia Neg Hx   . Mental retardation Neg Hx   . Migraines Neg Hx   . Multiple sclerosis Neg Hx   . Neurofibromatosis Neg Hx   . Neuropathy Neg Hx   . Parkinsonism Neg Hx   . Seizures Neg Hx   . Stroke Neg Hx     SOCIAL HISTORY:  Social History   Socioeconomic History  . Marital status: Single    Spouse name: Not on file  . Number of children: Not on file  . Years of education: Not on file  . Highest education level: Not on file  Occupational History  . Not on file  Tobacco Use  . Smoking status: Current Every Day Smoker    Packs/day: 0.50    Types: Cigarettes  . Smokeless tobacco: Never Used  Vaping Use  . Vaping Use:  Former  Substance and Sexual Activity  . Alcohol use: Yes    Alcohol/week: 2.0 standard drinks    Types: 2 Cans of beer per week    Comment:  two to three beers a week  . Drug use: Yes    Frequency: 7.0 times per week    Types: Marijuana  . Sexual activity: Yes    Partners: Female  Other Topics Concern  . Not on file  Social History Narrative  . Not on file   Social Determinants of  Health   Financial Resource Strain:   . Difficulty of Paying Living Expenses:   Food Insecurity:   . Worried About Programme researcher, broadcasting/film/video in the Last Year:   . Barista in the Last Year:   Transportation Needs:   . Freight forwarder (Medical):   [name] Lack of Transportation (Non-Medical):   Physical Activity:   . Days of Exercise per Week:   . Minutes of Exercise per Session:   Stress:   . Feeling of Stress :   Social Connections:   . Frequency of Communication with Friends and Family:   . Frequency of Social Gatherings with Friends and Family:   . Attends Religious Services:   . Active Member of Clubs or Organizations:   . Attends Banker Meetings:   [name] Marital Status:   Intimate Partner Violence:   . Fear of Current or Ex-Partner:   . Emotionally Abused:   [name] Physically Abused:   . Sexually Abused:      PHYSICAL EXAM  Vitals:   12/08/19 1301  BP: 106/70  Pulse: 60  Weight: 197 lb (89.4 kg)  Height: 5\' 11"  (1.803 m)    Body mass index is 27.48 kg/m.   General: The patient is well-developed and well-nourished and in no acute distress.   Neurologic Exam  Mental status: The patient is alert and oriented x 3 at the time of the examination. The patient has apparent normal recent and remote memory, with an apparently normal attention span and concentration ability.   Speech is normal.  Cranial nerves: Extraocular movements are full.   2+ right APD.     Mild reduced color vision on the right.     Facial strength is normal. Trapezius strength is strong.  No  obvious hearing deficits are noted.  Motor:  Muscle bulk is normal.   Left rapid altering movements are slow in the hand.  There is increased muscle tone in the left arm and both legs, left greater than right.  Strength is 5/5 in the right arm, 4+/5 in the left arm.  And 4- to 4/5 in the right leg and 4/5 in the left leg.  Sensory: He has reduced vibration sensation in the left leg.   Coordination: Cerebellar testing reveals mildly reduced left finger-nose-finger and mildly ataxic heel-to-shin bilaterally.  Gait and station: Station is normal.   The gait is wide and spastic but he can walk without a cane.  Speed is better with a cane.  He cannot tandem walk.   Romberg is borderline  Reflexes: Deep tendon reflexes are increased with spread at the knees.  He has nonsustained clonus at the ankles      _______________________________________________________ Relapsing remitting multiple sclerosis (HCC) - Plan: Stratify JCV Antibody Test ([facility]), CBC with Differential/Platelet  High risk medication use - Plan: Stratify JCV Antibody Test ([facility]), CBC with Differential/Platelet  Spastic gait  Optic neuritis  Urinary hesitancy  Vitamin D deficiency - Plan: VITAMIN D 25 Hydroxy (Vit-D Deficiency, Fractures)  1.   Continue Tysabri.  Check JCV antibody, CBC and vitamin D... 2.   He will continue Ampyra 10 mg twice daily for gait and baclofen for spasticity 3.   Continue gabapentin for dysesthesia and back pain. 4.   Continue tamsulosin for bladder.     5.   Return in 6 months or sooner if new or worsening neurologic symptoms.    Makana Feigel A. [name], MD, PhD 12/08/2019, 2:08 PM Certified in Neurology, Clinical  Neurophysiology, Sleep Medicine, Pain Medicine and Neuroimaging  [facility] [address], [place], [place] [address] [phone]

## 2019-12-09 ENCOUNTER — Telehealth: Payer: Self-pay | Admitting: *Deleted

## 2019-12-09 DIAGNOSIS — E559 Vitamin D deficiency, unspecified: Secondary | ICD-10-CM

## 2019-12-09 LAB — VITAMIN D 25 HYDROXY (VIT D DEFICIENCY, FRACTURES): Vit D, 25-Hydroxy: 16.9 ng/mL — ABNORMAL LOW (ref 30.0–100.0)

## 2019-12-09 LAB — CBC WITH DIFFERENTIAL/PLATELET
Basophils Absolute: 0.1 10*3/uL (ref 0.0–0.2)
Basos: 1 %
EOS (ABSOLUTE): 0.3 10*3/uL (ref 0.0–0.4)
Eos: 3 %
Hematocrit: 46.2 % (ref 37.5–51.0)
Hemoglobin: 15.5 g/dL (ref 13.0–17.7)
Immature Grans (Abs): 0.1 10*3/uL (ref 0.0–0.1)
Immature Granulocytes: 1 %
Lymphocytes Absolute: 4.5 10*3/uL — ABNORMAL HIGH (ref 0.7–3.1)
Lymphs: 46 %
MCH: 32.4 pg (ref 26.6–33.0)
MCHC: 33.5 g/dL (ref 31.5–35.7)
MCV: 97 fL (ref 79–97)
Monocytes Absolute: 0.7 10*3/uL (ref 0.1–0.9)
Monocytes: 8 %
NRBC: 2 % — ABNORMAL HIGH (ref 0–0)
Neutrophils Absolute: 3.9 10*3/uL (ref 1.4–7.0)
Neutrophils: 41 %
Platelets: 156 10*3/uL (ref 150–450)
RBC: 4.79 x10E6/uL (ref 4.14–5.80)
RDW: 14.3 % (ref 11.6–15.4)
WBC: 9.5 10*3/uL (ref 3.4–10.8)

## 2019-12-09 MED ORDER — VITAMIN D (ERGOCALCIFEROL) 1.25 MG (50000 UNIT) PO CAPS: 50000.0000 [IU] | ORAL_CAPSULE | ORAL | 3 refills | Status: DC

## 2019-12-09 NOTE — Telephone Encounter (Signed)
-----   Message from Asa Lente, MD sent at 12/09/2019  8:37 AM EDT ----- His Vit D is low.   I'd like him to do 50000 U weekly   #13   #3

## 2019-12-12 ENCOUNTER — Ambulatory Visit (HOSPITAL_COMMUNITY): Payer: Medicaid Other

## 2019-12-16 NOTE — Telephone Encounter (Signed)
JCV resulted for the patient.  Index value-0.20 and Antibody-indeterminate and JCV antibody assay- negative.

## 2019-12-17 ENCOUNTER — Telehealth: Payer: Self-pay | Admitting: *Deleted

## 2019-12-17 ENCOUNTER — Other Ambulatory Visit: Payer: Self-pay

## 2019-12-17 ENCOUNTER — Ambulatory Visit (HOSPITAL_COMMUNITY)
Admission: RE | Admit: 2019-12-17 | Discharge: 2019-12-17 | Disposition: A | Discharge status: Home / Self Care | Payer: Medicaid Other | Source: Ambulatory Visit | Attending: Internal Medicine | Admitting: Internal Medicine

## 2019-12-17 DIAGNOSIS — G35 Multiple sclerosis: Secondary | ICD-10-CM | POA: Diagnosis not present

## 2019-12-17 MED ORDER — SODIUM CHLORIDE 0.9 % IV SOLN
INTRAVENOUS | Status: DC | PRN
  Administered 2019-12-17: 250 mL via INTRAVENOUS

## 2019-12-17 MED ORDER — SODIUM CHLORIDE 0.9 % IV SOLN
300.0000 mg | Freq: Once | INTRAVENOUS | Status: AC
  Administered 2019-12-17: 300 mg via INTRAVENOUS
  Filled 2019-12-17: qty 15

## 2019-12-17 NOTE — Discharge Instructions (Signed)
Natalizumab injection What is this medicine? NATALIZUMAB (na ta LIZ you mab) is used to treat relapsing multiple sclerosis. This drug is not a cure. It is also used to treat Crohn's disease. This medicine may be used for other purposes; ask your health care provider or pharmacist if you have questions. COMMON BRAND NAME(S): Tysabri What should I tell my health care provider before I take this medicine? They need to know if you have any of these conditions:  immune system problems  progressive multifocal leukoencephalopathy (PML)  an unusual or allergic reaction to natalizumab, other medicines, foods, dyes, or preservatives  pregnant or trying to get pregnant  breast-feeding How should I use this medicine? This medicine is for infusion into a vein. It is given by a health care professional in a hospital or clinic setting. A special MedGuide will be given to you by the pharmacist with each prescription and refill. Be sure to read this information carefully each time. Talk to your pediatrician regarding the use of this medicine in children. This medicine is not approved for use in children. Overdosage: If you think you have taken too much of this medicine contact a poison control center or emergency room at once. NOTE: This medicine is only for you. Do not share this medicine with others. What if I miss a dose? It is important not to miss your dose. Call your doctor or health care professional if you are unable to keep an appointment. What may interact with this medicine? Do not take this medicine with any of the following medications:  biologic medicines such as adalimumab, certolizumab, etanercept, golimumab, infliximab This medicine may also interact with the following medications:  azathioprine  cyclosporine  interferons  6-mercaptopurine  methotrexate  other medicines that lower your chance of fighting an infection  steroid medicines like prednisone or  cortisone  vaccines This list may not describe all possible interactions. Give your health care provider a list of all the medicines, herbs, non-prescription drugs, or dietary supplements you use. Also tell them if you smoke, drink alcohol, or use illegal drugs. Some items may interact with your medicine. What should I watch for while using this medicine? Your condition will be monitored carefully while you are receiving this medicine. Visit your doctor for regular check ups. Tell your doctor or healthcare professional if your symptoms do not start to get better or if they get worse. Stay away from people who are sick. Call your doctor or health care professional for advice if you get a fever, chills or sore throat, or other symptoms of a cold or flu. Do not treat yourself. In some patients, this medicine may cause a serious brain infection that may cause death. If you have any problems seeing, thinking, speaking, walking, or standing, tell your doctor right away. If you cannot reach your doctor, get urgent medical care. What side effects may I notice from receiving this medicine? Side effects that you should report to your doctor or health care professional as soon as possible:  allergic reactions like skin rash, itching or hives, swelling of the face, lips, or tongue  breathing problems  changes in vision  chest pain  confusion  depressed mood  dizziness  feeling faint; lightheaded; falls  general ill feeling or flu-like symptoms  loss of memory  missed menstrual periods  muscle weakness  problems with balance, talking, or walking  signs and symptoms of liver injury like dark yellow or brown urine; general ill feeling or flu-like symptoms; light-colored   stools; loss of appetite; nausea; right upper belly pain; unusually weak or tired; yellowing of the eyes or skin  suicidal thoughts, mood changes  unusual bruising or bleeding  unusually weak or tired Side effects that  usually do not require medical attention (report to your doctor or health care professional if they continue or are bothersome):  headache  joint pain  muscle cramps  muscle pain  nausea, vomiting  pain, redness, or irritation at site where injected  tiredness This list may not describe all possible side effects. Call your doctor for medical advice about side effects. You may report side effects to FDA at 1-800-FDA-1088. Where should I keep my medicine? This drug is given in a hospital or clinic and will not be stored at home. NOTE: This sheet is a summary. It may not cover all possible information. If you have questions about this medicine, talk to your doctor, pharmacist, or health care provider.  2020 Elsevier/Gold Standard (2018-12-09 13:20:26)  

## 2019-12-17 NOTE — Progress Notes (Signed)
Patient d/ced after infusion of Tysabri.  No complaints during infusion and no s/s of distress noted during or after transfusion.  VSS.  Right hand PIV d/ced.  Discharge instructions given to patient.  All of patient's questions answered.  All of patient's belongings left with patient.

## 2019-12-17 NOTE — Telephone Encounter (Signed)
Took call from phone room and spoke with Bradley Brandt from [facility]. Pt there now to get Tysabri infusion. He reports that he has fallen twice, unsure of when. Advised pt recently saw Dr. Epimenio Foot on 12/08/19 and reported fall the week prior. Denies hitting head. She spoke with pt/father while I was on the phone and father was with him all day yesterday and there was no fall. Denies any injuries, hitting head. Advised ok to proceed with Tysabri infusion. If he has any more falls, he should contact our office. She verbalized understanding.

## 2019-12-17 NOTE — Progress Notes (Signed)
Tysabri infusion begun as ordered after clearance from Dr. Epimenio Foot.  Patient instructed that if he falls again, to let MD office know.

## 2020-01-15 ENCOUNTER — Other Ambulatory Visit: Payer: Self-pay

## 2020-01-15 ENCOUNTER — Ambulatory Visit (HOSPITAL_COMMUNITY)
Admission: RE | Admit: 2020-01-15 | Discharge: 2020-01-15 | Disposition: A | Discharge status: Home / Self Care | Payer: Medicaid Other | Source: Ambulatory Visit | Attending: Neurology | Admitting: Neurology

## 2020-01-15 DIAGNOSIS — G35 Multiple sclerosis: Secondary | ICD-10-CM | POA: Insufficient documentation

## 2020-01-15 MED ORDER — SODIUM CHLORIDE 0.9 % IV SOLN
300.0000 mg | INTRAVENOUS | Status: DC
  Administered 2020-01-15: 300 mg via INTRAVENOUS
  Filled 2020-01-15: qty 15

## 2020-01-15 MED ORDER — SODIUM CHLORIDE 0.9 % IV SOLN
INTRAVENOUS | Status: DC | PRN
  Administered 2020-01-15: 250 mL via INTRAVENOUS

## 2020-01-15 NOTE — Progress Notes (Signed)
PATIENT CARE CENTER NOTE  Diagnosis:Multiple Sclerosis   Provider:Sater, Richard, MD   Procedure:Tysabri IV   Note:Patient received Tysabri infusionvia PIV. Tolerated infusion well with no adverse reaction. Vital signs stable. Discharge instructions given. Patientdeclinedto stay for the1 hour post-infusionobservation. Alert, oriented and ambulatory at discharge.          

## 2020-01-15 NOTE — Discharge Instructions (Signed)
Natalizumab injection What is this medicine? NATALIZUMAB (na ta LIZ you mab) is used to treat relapsing multiple sclerosis. This drug is not a cure. It is also used to treat Crohn's disease. This medicine may be used for other purposes; ask your health care provider or pharmacist if you have questions. COMMON BRAND NAME(S): Tysabri What should I tell my health care provider before I take this medicine? They need to know if you have any of these conditions:  immune system problems  progressive multifocal leukoencephalopathy (PML)  an unusual or allergic reaction to natalizumab, other medicines, foods, dyes, or preservatives  pregnant or trying to get pregnant  breast-feeding How should I use this medicine? This medicine is for infusion into a vein. It is given by a health care professional in a hospital or clinic setting. A special MedGuide will be given to you by the pharmacist with each prescription and refill. Be sure to read this information carefully each time. Talk to your pediatrician regarding the use of this medicine in children. This medicine is not approved for use in children. Overdosage: If you think you have taken too much of this medicine contact a poison control center or emergency room at once. NOTE: This medicine is only for you. Do not share this medicine with others. What if I miss a dose? It is important not to miss your dose. Call your doctor or health care professional if you are unable to keep an appointment. What may interact with this medicine? Do not take this medicine with any of the following medications:  biologic medicines such as adalimumab, certolizumab, etanercept, golimumab, infliximab This medicine may also interact with the following medications:  azathioprine  cyclosporine  interferons  6-mercaptopurine  methotrexate  other medicines that lower your chance of fighting an infection  steroid medicines like prednisone or  cortisone  vaccines This list may not describe all possible interactions. Give your health care provider a list of all the medicines, herbs, non-prescription drugs, or dietary supplements you use. Also tell them if you smoke, drink alcohol, or use illegal drugs. Some items may interact with your medicine. What should I watch for while using this medicine? Your condition will be monitored carefully while you are receiving this medicine. Visit your doctor for regular check ups. Tell your doctor or healthcare professional if your symptoms do not start to get better or if they get worse. Stay away from people who are sick. Call your doctor or health care professional for advice if you get a fever, chills or sore throat, or other symptoms of a cold or flu. Do not treat yourself. In some patients, this medicine may cause a serious brain infection that may cause death. If you have any problems seeing, thinking, speaking, walking, or standing, tell your doctor right away. If you cannot reach your doctor, get urgent medical care. What side effects may I notice from receiving this medicine? Side effects that you should report to your doctor or health care professional as soon as possible:  allergic reactions like skin rash, itching or hives, swelling of the face, lips, or tongue  breathing problems  changes in vision  chest pain  confusion  depressed mood  dizziness  feeling faint; lightheaded; falls  general ill feeling or flu-like symptoms  loss of memory  missed menstrual periods  muscle weakness  problems with balance, talking, or walking  signs and symptoms of liver injury like dark yellow or brown urine; general ill feeling or flu-like symptoms; light-colored   stools; loss of appetite; nausea; right upper belly pain; unusually weak or tired; yellowing of the eyes or skin  suicidal thoughts, mood changes  unusual bruising or bleeding  unusually weak or tired Side effects that  usually do not require medical attention (report to your doctor or health care professional if they continue or are bothersome):  headache  joint pain  muscle cramps  muscle pain  nausea, vomiting  pain, redness, or irritation at site where injected  tiredness This list may not describe all possible side effects. Call your doctor for medical advice about side effects. You may report side effects to FDA at 1-800-FDA-1088. Where should I keep my medicine? This drug is given in a hospital or clinic and will not be stored at home. NOTE: This sheet is a summary. It may not cover all possible information. If you have questions about this medicine, talk to your doctor, pharmacist, or health care provider.  2020 Elsevier/Gold Standard (2018-12-09 13:20:26)  

## 2020-01-19 ENCOUNTER — Telehealth: Payer: Self-pay | Admitting: *Deleted

## 2020-01-19 NOTE — Telephone Encounter (Signed)
Faxed completed/signed Tysabri pt status report and reauth questionnaire to [place] [facility] at [phone]. Received confirmation.  Received fax notification from touch prescribing program that pt re-authorized from 01/19/20-08/21/20 for Tysabri. Pt enrollment number: [id number]. Account: [facility]. Site auth number: [id number].

## 2020-02-16 ENCOUNTER — Other Ambulatory Visit: Payer: Self-pay

## 2020-02-16 ENCOUNTER — Ambulatory Visit (HOSPITAL_COMMUNITY)
Admission: RE | Admit: 2020-02-16 | Discharge: 2020-02-16 | Disposition: A | Discharge status: Home / Self Care | Payer: Medicaid Other | Source: Ambulatory Visit | Attending: Neurology | Admitting: Neurology

## 2020-02-16 DIAGNOSIS — G35 Multiple sclerosis: Secondary | ICD-10-CM | POA: Insufficient documentation

## 2020-02-16 MED ORDER — SODIUM CHLORIDE 0.9 % IV SOLN
300.0000 mg | INTRAVENOUS | Status: DC
  Administered 2020-02-16: 300 mg via INTRAVENOUS
  Filled 2020-02-16: qty 15

## 2020-02-16 MED ORDER — SODIUM CHLORIDE 0.9 % IV SOLN
INTRAVENOUS | Status: DC | PRN
  Administered 2020-02-16: 250 mL via INTRAVENOUS

## 2020-02-16 NOTE — Progress Notes (Signed)
PATIENT CARE CENTER NOTE  Diagnosis:Multiple Sclerosis   Provider:Sater, Richard, MD   Procedure:Tysabri IV   Note:Patient received Tysabri infusionvia PIV. Tolerated infusion well with no adverse reaction. Vital signs stable. Discharge instructions given. Patientdeclinedto stay for the1 hour post-infusionobservation. Alert, oriented and ambulatory at discharge.          

## 2020-02-16 NOTE — Discharge Instructions (Signed)
Natalizumab injection What is this medicine? NATALIZUMAB (na ta LIZ you mab) is used to treat relapsing multiple sclerosis. This drug is not a cure. It is also used to treat Crohn's disease. This medicine may be used for other purposes; ask your health care provider or pharmacist if you have questions. COMMON BRAND NAME(S): Tysabri What should I tell my health care provider before I take this medicine? They need to know if you have any of these conditions:  immune system problems  progressive multifocal leukoencephalopathy (PML)  an unusual or allergic reaction to natalizumab, other medicines, foods, dyes, or preservatives  pregnant or trying to get pregnant  breast-feeding How should I use this medicine? This medicine is for infusion into a vein. It is given by a health care professional in a hospital or clinic setting. A special MedGuide will be given to you by the pharmacist with each prescription and refill. Be sure to read this information carefully each time. Talk to your pediatrician regarding the use of this medicine in children. This medicine is not approved for use in children. Overdosage: If you think you have taken too much of this medicine contact a poison control center or emergency room at once. NOTE: This medicine is only for you. Do not share this medicine with others. What if I miss a dose? It is important not to miss your dose. Call your doctor or health care professional if you are unable to keep an appointment. What may interact with this medicine? Do not take this medicine with any of the following medications:  biologic medicines such as adalimumab, certolizumab, etanercept, golimumab, infliximab This medicine may also interact with the following medications:  azathioprine  cyclosporine  interferons  6-mercaptopurine  methotrexate  other medicines that lower your chance of fighting an infection  steroid medicines like prednisone or  cortisone  vaccines This list may not describe all possible interactions. Give your health care provider a list of all the medicines, herbs, non-prescription drugs, or dietary supplements you use. Also tell them if you smoke, drink alcohol, or use illegal drugs. Some items may interact with your medicine. What should I watch for while using this medicine? Your condition will be monitored carefully while you are receiving this medicine. Visit your doctor for regular check ups. Tell your doctor or healthcare professional if your symptoms do not start to get better or if they get worse. Stay away from people who are sick. Call your doctor or health care professional for advice if you get a fever, chills or sore throat, or other symptoms of a cold or flu. Do not treat yourself. In some patients, this medicine may cause a serious brain infection that may cause death. If you have any problems seeing, thinking, speaking, walking, or standing, tell your doctor right away. If you cannot reach your doctor, get urgent medical care. What side effects may I notice from receiving this medicine? Side effects that you should report to your doctor or health care professional as soon as possible:  allergic reactions like skin rash, itching or hives, swelling of the face, lips, or tongue  breathing problems  changes in vision  chest pain  confusion  depressed mood  dizziness  feeling faint; lightheaded; falls  general ill feeling or flu-like symptoms  loss of memory  missed menstrual periods  muscle weakness  problems with balance, talking, or walking  signs and symptoms of liver injury like dark yellow or brown urine; general ill feeling or flu-like symptoms; light-colored   stools; loss of appetite; nausea; right upper belly pain; unusually weak or tired; yellowing of the eyes or skin  suicidal thoughts, mood changes  unusual bruising or bleeding  unusually weak or tired Side effects that  usually do not require medical attention (report to your doctor or health care professional if they continue or are bothersome):  headache  joint pain  muscle cramps  muscle pain  nausea, vomiting  pain, redness, or irritation at site where injected  tiredness This list may not describe all possible side effects. Call your doctor for medical advice about side effects. You may report side effects to FDA at 1-800-FDA-1088. Where should I keep my medicine? This drug is given in a hospital or clinic and will not be stored at home. NOTE: This sheet is a summary. It may not cover all possible information. If you have questions about this medicine, talk to your doctor, pharmacist, or health care provider.  2020 Elsevier/Gold Standard (2018-12-09 13:20:26)  

## 2020-03-16 ENCOUNTER — Other Ambulatory Visit: Payer: Self-pay

## 2020-03-16 ENCOUNTER — Ambulatory Visit (HOSPITAL_COMMUNITY)
Admission: RE | Admit: 2020-03-16 | Discharge: 2020-03-16 | Disposition: A | Discharge status: Home / Self Care | Payer: Medicaid Other | Source: Ambulatory Visit | Attending: Neurology | Admitting: Neurology

## 2020-03-16 DIAGNOSIS — G35 Multiple sclerosis: Secondary | ICD-10-CM | POA: Insufficient documentation

## 2020-03-16 MED ORDER — SODIUM CHLORIDE 0.9 % IV SOLN
300.0000 mg | INTRAVENOUS | Status: DC
  Administered 2020-03-16: 300 mg via INTRAVENOUS
  Filled 2020-03-16: qty 15

## 2020-03-16 MED ORDER — SODIUM CHLORIDE 0.9 % IV SOLN
INTRAVENOUS | Status: DC | PRN
  Administered 2020-03-16: 250 mL via INTRAVENOUS

## 2020-03-16 NOTE — Discharge Instructions (Signed)
Natalizumab injection What is this medicine? NATALIZUMAB (na ta LIZ you mab) is used to treat relapsing multiple sclerosis. This drug is not a cure. It is also used to treat Crohn's disease. This medicine may be used for other purposes; ask your health care provider or pharmacist if you have questions. COMMON BRAND NAME(S): Tysabri What should I tell my health care provider before I take this medicine? They need to know if you have any of these conditions:  immune system problems  progressive multifocal leukoencephalopathy (PML)  an unusual or allergic reaction to natalizumab, other medicines, foods, dyes, or preservatives  pregnant or trying to get pregnant  breast-feeding How should I use this medicine? This medicine is for infusion into a vein. It is given by a health care professional in a hospital or clinic setting. A special MedGuide will be given to you by the pharmacist with each prescription and refill. Be sure to read this information carefully each time. Talk to your pediatrician regarding the use of this medicine in children. This medicine is not approved for use in children. Overdosage: If you think you have taken too much of this medicine contact a poison control center or emergency room at once. NOTE: This medicine is only for you. Do not share this medicine with others. What if I miss a dose? It is important not to miss your dose. Call your doctor or health care professional if you are unable to keep an appointment. What may interact with this medicine? Do not take this medicine with any of the following medications:  biologic medicines such as adalimumab, certolizumab, etanercept, golimumab, infliximab This medicine may also interact with the following medications:  azathioprine  cyclosporine  interferons  6-mercaptopurine  methotrexate  other medicines that lower your chance of fighting an infection  steroid medicines like prednisone or  cortisone  vaccines This list may not describe all possible interactions. Give your health care provider a list of all the medicines, herbs, non-prescription drugs, or dietary supplements you use. Also tell them if you smoke, drink alcohol, or use illegal drugs. Some items may interact with your medicine. What should I watch for while using this medicine? Your condition will be monitored carefully while you are receiving this medicine. Visit your doctor for regular check ups. Tell your doctor or healthcare professional if your symptoms do not start to get better or if they get worse. Stay away from people who are sick. Call your doctor or health care professional for advice if you get a fever, chills or sore throat, or other symptoms of a cold or flu. Do not treat yourself. In some patients, this medicine may cause a serious brain infection that may cause death. If you have any problems seeing, thinking, speaking, walking, or standing, tell your doctor right away. If you cannot reach your doctor, get urgent medical care. What side effects may I notice from receiving this medicine? Side effects that you should report to your doctor or health care professional as soon as possible:  allergic reactions like skin rash, itching or hives, swelling of the face, lips, or tongue  breathing problems  changes in vision  chest pain  confusion  depressed mood  dizziness  feeling faint; lightheaded; falls  general ill feeling or flu-like symptoms  loss of memory  missed menstrual periods  muscle weakness  problems with balance, talking, or walking  signs and symptoms of liver injury like dark yellow or brown urine; general ill feeling or flu-like symptoms; light-colored   stools; loss of appetite; nausea; right upper belly pain; unusually weak or tired; yellowing of the eyes or skin  suicidal thoughts, mood changes  unusual bruising or bleeding  unusually weak or tired Side effects that  usually do not require medical attention (report to your doctor or health care professional if they continue or are bothersome):  headache  joint pain  muscle cramps  muscle pain  nausea, vomiting  pain, redness, or irritation at site where injected  tiredness This list may not describe all possible side effects. Call your doctor for medical advice about side effects. You may report side effects to FDA at 1-800-FDA-1088. Where should I keep my medicine? This drug is given in a hospital or clinic and will not be stored at home. NOTE: This sheet is a summary. It may not cover all possible information. If you have questions about this medicine, talk to your doctor, pharmacist, or health care provider.  2020 Elsevier/Gold Standard (2018-12-09 13:20:26)  

## 2020-03-16 NOTE — Progress Notes (Signed)
Patient receivedIV Bradley Brandt ordered by Despina Arias MD.Declined 1 hour post infusion observation.Tolerated well, vitals stable, discharge instructions given, verbalized understanding. Patient alert, oriented and ambulatory at the time of discharge

## 2020-03-31 ENCOUNTER — Ambulatory Visit (INDEPENDENT_AMBULATORY_CARE_PROVIDER_SITE_OTHER): Payer: Medicaid Other | Admitting: Family Medicine

## 2020-03-31 ENCOUNTER — Other Ambulatory Visit: Payer: Self-pay

## 2020-03-31 DIAGNOSIS — M545 Low back pain, unspecified: Secondary | ICD-10-CM

## 2020-03-31 NOTE — Progress Notes (Signed)
Patient left before being roomed.

## 2020-04-01 ENCOUNTER — Other Ambulatory Visit: Payer: Self-pay

## 2020-04-01 ENCOUNTER — Encounter (HOSPITAL_COMMUNITY): Payer: Self-pay | Admitting: Emergency Medicine

## 2020-04-01 ENCOUNTER — Ambulatory Visit: Payer: Medicaid Other | Admitting: Nurse Practitioner

## 2020-04-01 ENCOUNTER — Ambulatory Visit (HOSPITAL_COMMUNITY)
Admission: EM | Admit: 2020-04-01 | Discharge: 2020-04-01 | Disposition: A | Discharge status: Home / Self Care | Payer: Medicaid Other

## 2020-04-01 DIAGNOSIS — M545 Low back pain, unspecified: Secondary | ICD-10-CM

## 2020-04-01 DIAGNOSIS — G8929 Other chronic pain: Secondary | ICD-10-CM | POA: Diagnosis not present

## 2020-04-01 NOTE — ED Provider Notes (Signed)
[facility]    CSN: [id number] Arrival date & time: 04/01/20  0805      History   Chief Complaint Chief Complaint  Patient presents with  . Back Pain    HPI Bradley Brandt is a 39 y.o. male.   Here today for acute on chronic low back pain, lower extremity weakness related to his MS. He states no new injury or inciting event, no fevers, urinary sxs, abdominal pain. Feels like the nerves in his low back are falling out of him per patient. Of note was to have seen his Orthopedist yesterday but left prior to being seen due to some confusion over his insurance and he states they called him this morning to see if he wanted to be seen but he came here instead seeking care for his pain. Does follow closely with Neurology for his MS and is on infusion therapy for this per chart review. Unable to take narcotic pain medications due to past issues. Currently on muscle relaxers, gabapentin and NSAIDs for his pain.      Past Medical History:  Diagnosis Date  . Bipolar 1 disorder (HCC)   . Bipolar disorder, unspecified (HCC) 01/03/2013  . MS (multiple sclerosis) (HCC) dx 2006   relapsing-remitting right sided weakness  . Neuromuscular disorder [facility])    MS    Patient Active Problem List   Diagnosis Date Noted  . 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HCC) 12/31/2017  . Optic neuritis 08/02/2017  . High risk medication use 08/02/2017  . Numbness 12/16/2015  . Spastic gait 12/16/2015  . Urinary hesitancy 12/16/2015  . Low back pain with sciatica 09/28/2014  . Polysubstance dependence (HCC) 02/09/2014  . 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 Relapsing remitting multiple sclerosis (HCC) 05/28/2013  . Elevated BP 01/06/2013  . Bipolar disorder, unspecified (HCC) 01/03/2013  . Bipolar affective disorder (HCC) 01/03/2013  . Right hemiparesis (HCC) 01/02/2013  . Multiple sclerosis flare 01/02/2013  . Hemiplegia (HCC) 01/02/2013    Past Surgical History:  Procedure  Laterality Date  . TUMOR REMOVAL  1983   abdomen benign       Home Medications    Prior to Admission medications   Medication Sig Start Date End Date Taking? Authorizing Provider  baclofen (LIORESAL) 20 MG tablet Take 1 tablet (20 mg total) by mouth 3 (three) times daily. 12/03/18  Yes Sater, Pearletha Furl, MD  gabapentin (NEURONTIN) 600 MG tablet Take 1 tablet (600 mg total) by mouth 3 (three) times daily. 12/03/18  Yes Sater, Pearletha Furl, MD  natalizumab (TYSABRI) 300 MG/15ML injection Inject 15 mLs (300 mg total) into the vein every 28 (twenty-eight) days. 09/05/16  Yes Sater, Pearletha Furl, MD  tamsulosin (FLOMAX) 0.4 MG CAPS capsule Take 2 capsules (0.8 mg total) by mouth daily. 12/03/18  Yes Sater, Pearletha Furl, MD  Vitamin D, Ergocalciferol, (DRISDOL) 1.25 MG (50000 UNIT) CAPS capsule Take 1 capsule (50,000 Units total) by mouth every 7 (seven) days. 12/09/19  Yes Sater, Pearletha Furl, MD  acetaminophen (TYLENOL) 500 MG tablet Take 500 mg by mouth 2 (two) times daily.    [provider]  dalfampridine 10 MG TB12 Take 1 tablet by mouth every 12 hours. 12/03/18   Sater, Pearletha Furl, MD  diclofenac (VOLTAREN) 75 MG EC tablet Take 1 tablet (75 mg total) by mouth 2 (two) times daily. 12/03/18   Sater, Pearletha Furl, MD    Family History Family History  Adopted: Yes  Problem Relation Age of Onset  .  Lupus Mother   . Ataxia Neg Hx   . Chorea Neg Hx   . Dementia Neg Hx   . Mental retardation Neg Hx   . Migraines Neg Hx   . Multiple sclerosis Neg Hx   . Neurofibromatosis Neg Hx   . Neuropathy Neg Hx   . Parkinsonism Neg Hx   . Seizures Neg Hx   . Stroke Neg Hx     Social History Social History   Tobacco Use  . Smoking status: Former Smoker    Packs/day: 0.00  . Smokeless tobacco: Never Used  Vaping Use  . Vaping Use: Former  Substance Use Topics  . Alcohol use: Yes    Comment: once a month  . Drug use: Yes    Frequency: 7.0 times per week    Types: Marijuana     Allergies     Lithium   Review of Systems Review of Systems PER HPI   Physical Exam Triage Vital Signs ED Triage Vitals  Enc Vitals Group     BP 04/01/20 0834 115/75     Pulse Rate 04/01/20 0834 (!) 55     Resp 04/01/20 0834 16     Temp 04/01/20 0834 97.6 F (36.4 C)     Temp Source 04/01/20 0834 Oral     SpO2 04/01/20 0834 98 %     Weight --      Height --      Head Circumference --      Peak Flow --      Pain Score 04/01/20 0830 10     Pain Loc --      Pain Edu? --      Excl. in GC? --    No data found.  Updated Vital Signs BP 115/75 (BP Location: Right Arm)   Pulse (!) 55   Temp 97.6 F (36.4 C) (Oral)   Resp 16   SpO2 98%   Visual Acuity Right Eye Distance:   Left Eye Distance:   Bilateral Distance:    Right Eye Near:   Left Eye Near:    Bilateral Near:     Physical Exam Vitals and nursing note reviewed.  Constitutional:      Appearance: Normal appearance.  HENT:     Head: Atraumatic.  Eyes:     Extraocular Movements: Extraocular movements intact.     Conjunctiva/sclera: Conjunctivae normal.  Cardiovascular:     Rate and Rhythm: Normal rate and regular rhythm.  Pulmonary:     Effort: Pulmonary effort is normal.     Breath sounds: Normal breath sounds.  Abdominal:     General: Bowel sounds are normal. There is no distension.     Palpations: Abdomen is soft.     Tenderness: There is no abdominal tenderness. There is no guarding.  Musculoskeletal:     Cervical back: Normal range of motion and neck supple.     Comments: Walking with a cane, antalgic gait  Skin:    General: Skin is warm and dry.  Neurological:     Mental Status: He is oriented to person, place, and time.     Motor: Weakness (b/l LE, at baseline per patient) present.     Gait: Gait abnormal.  Psychiatric:        Mood and Affect: Mood normal.        Thought Content: Thought content normal.        Judgment: Judgment normal.      UC Treatments / Results  Labs (  all labs ordered are  listed, but only abnormal results are displayed) Labs Reviewed - No data to display  EKG   Radiology No results found.  Procedures Procedures (including critical care time)  Medications Ordered in UC Medications - No data to display  Initial Impression / Assessment and Plan / UC Course  I have reviewed the triage vital signs and the nursing notes.  Pertinent labs & imaging results that were available during my care of the patient were reviewed by me and considered in my medical decision making (see chart for details).     Declines U/A today, discussed at length importance of keeping specialist appts given the complexity of his condition and urged him to go back to his Orthopedist today as recommended. Needs to also f/u with his Neurologist managing his MS. Unable to give narcotics given past compliance issues. Patient understanding of this and will call Ortho back today.   Final Clinical Impressions(s) / UC Diagnoses   Final diagnoses:  Chronic bilateral low back pain without sciatica   Discharge Instructions   None    ED Prescriptions    None     PDMP not reviewed this encounter.   Roosvelt Maser McN[place], [place] 04/01/20 [phone]

## 2020-04-01 NOTE — ED Triage Notes (Signed)
Pt c/o lower back pain and weakness in his extremities. This is a chronic problem. Pt states his PCP will no longer see him because he had a problem with medication compliance. Pt states he was previously taking suboxone for pain. He states once he left prison he was not allowed to have any narcotics.

## 2020-04-07 ENCOUNTER — Telehealth: Payer: Self-pay | Admitting: Neurology

## 2020-04-07 NOTE — Telephone Encounter (Signed)
Pt left voicemail asking for prednisone to be called in for him, please call.

## 2020-04-07 NOTE — Telephone Encounter (Signed)
We can call in a standard steroid pack.   If continuing to have issues try to get him in for sooner appt

## 2020-04-07 NOTE — Telephone Encounter (Signed)
Called pt to further discuss request for prednisone. He reports he is very weak, requesting prednisone to get his strength back up. Having increased falls, difficulty with speech, SOB when ambulating. Denies signs/sx of infection. Has f/u here 05/26/20. I advised pt that I saw he went to ED 04/01/20 for LBP. He confirmed this. Has f/u with orthopaedic MD tomorrow. His last Tysabri infusion at [facility] was 03/16/20. Next infusion: 04/13/20 at 8am. Advised I will have to discuss with MD first and will call back.  Also requested refill for clonidine. States PCP gave this to him. Advised he should f/u with PCP for this refill request.

## 2020-04-08 ENCOUNTER — Ambulatory Visit (INDEPENDENT_AMBULATORY_CARE_PROVIDER_SITE_OTHER): Payer: Medicaid Other | Admitting: Nurse Practitioner

## 2020-04-08 ENCOUNTER — Ambulatory Visit (HOSPITAL_COMMUNITY)
Admission: RE | Admit: 2020-04-08 | Discharge: 2020-04-08 | Disposition: A | Discharge status: Home / Self Care | Payer: 59 | Source: Ambulatory Visit | Attending: Nurse Practitioner | Admitting: Nurse Practitioner

## 2020-04-08 ENCOUNTER — Other Ambulatory Visit: Payer: Self-pay | Admitting: Nurse Practitioner

## 2020-04-08 ENCOUNTER — Other Ambulatory Visit: Payer: Self-pay

## 2020-04-08 ENCOUNTER — Encounter: Payer: Self-pay | Admitting: Nurse Practitioner

## 2020-04-08 VITALS — BP 118/81 | HR 63 | Temp 98.0°F | Ht 71.0 in | Wt 191.0 lb

## 2020-04-08 DIAGNOSIS — F3170 Bipolar disorder, currently in remission, most recent episode unspecified: Secondary | ICD-10-CM

## 2020-04-08 DIAGNOSIS — Z9189 Other specified personal risk factors, not elsewhere classified: Secondary | ICD-10-CM

## 2020-04-08 DIAGNOSIS — Z13220 Encounter for screening for lipoid disorders: Secondary | ICD-10-CM

## 2020-04-08 DIAGNOSIS — Z114 Encounter for screening for human immunodeficiency virus [HIV]: Secondary | ICD-10-CM

## 2020-04-08 DIAGNOSIS — Z130 Encounter for screening for diseases of the blood and blood-forming organs and certain disorders involving the immune mechanism: Secondary | ICD-10-CM

## 2020-04-08 DIAGNOSIS — M25422 Effusion, left elbow: Secondary | ICD-10-CM

## 2020-04-08 DIAGNOSIS — Z131 Encounter for screening for diabetes mellitus: Secondary | ICD-10-CM

## 2020-04-08 DIAGNOSIS — Z1159 Encounter for screening for other viral diseases: Secondary | ICD-10-CM

## 2020-04-08 LAB — POCT URINALYSIS DIPSTICK
Bilirubin, UA: NEGATIVE
Blood, UA: POSITIVE
Glucose, UA: NEGATIVE
Ketones, UA: NEGATIVE
Leukocytes, UA: NEGATIVE
Nitrite, UA: NEGATIVE
Protein, UA: NEGATIVE
Spec Grav, UA: >=1.03 — AB (ref 1.010–1.025)
Urobilinogen, UA: 1 E.U./dL
pH, UA: 6 (ref 5.0–8.0)

## 2020-04-08 MED ORDER — METHYLPREDNISOLONE 4 MG PO TBPK: ORAL_TABLET | ORAL | 0 refills | Status: DC

## 2020-04-08 MED ORDER — KETOROLAC TROMETHAMINE 60 MG/2ML IM SOLN
60.0000 mg | Freq: Once | INTRAMUSCULAR | Status: AC
  Administered 2020-04-08: 60 mg via INTRAMUSCULAR

## 2020-04-08 NOTE — Addendum Note (Signed)
Addended by: Arther Abbott on: 04/08/2020 08:13 AM   Modules accepted: Orders

## 2020-04-08 NOTE — Telephone Encounter (Signed)
Called and spoke with pt. Advised Dr. Epimenio Foot approved sending in steroid pack for him. E-scribed to [facility] on file. He verbalized understanding. He reported that he fell 2 weeks ago on elbows and left elbow still very swollen. I recommended he follow up with PCP asap for further evaluation/treatment on this. He verbalized understanding. Asked him to call back if steroid pack does not help and we will then fit him in for a sooner appt for further evaluation.

## 2020-04-08 NOTE — Progress Notes (Signed)
[facility] Patient [facility] [address] [place], [place]  [address] Phone:  [phone]   Fax:  [phone]   New Patient Office Visit  Subjective:  Patient ID: Bradley Brandt, male    DOB: June 04, 1981  Age: 39 y.o. MRN: [id number]  CC:  Chief Complaint  Patient presents with  . Establish Care    back pain not new symptoms worser recent.   patient has MS    HPI Bradley Brandt presents to establish care. He  has a past medical history of Bipolar 1 disorder (HCC), Bipolar disorder, unspecified (HCC) (01/03/2013), MS (multiple sclerosis) (HCC) (dx 2006), and Neuromuscular disorder (HCC).   Back Pain Patient presents for evaluation of low back problems. Symptoms have been present for 10 years and include pain in lower back (stabbing and throbbing in character; 10/10 in severity), paresthesias in legs and weakness in legs. Initial inciting event: DX of MS. Symptoms are worse in the morning and at nighttime. Alleviating factors identifiable by the patient are medication Oxycodone and Suboxone. Aggravating factors identifiable by the patient are bending backwards, sitting, standing and walking. Treatments initiated by the patient: as indicated no interventional therapy. Previous lower back problems: 2016. Previous work up: Museum/gallery curator. Previous treatments: History of opioid use.  On Suboxone up until June 2021.   He has a diagnosis of multiple sclerosis.  He admits that he has occasional falls.  He did fall 1 week ago on his left elbow.  He has been having pain and swelling.  He did not get evaluation at the time of the fall.  He denies any numbness, tingling or weakness in the left arm. He has not tried any type of treatment for his elbow.   Past Medical History:  Diagnosis Date  . Bipolar 1 disorder (HCC)   . Bipolar disorder, unspecified (HCC) 01/03/2013  . MS (multiple sclerosis) (HCC) dx 2006   relapsing-remitting right sided weakness  . Neuromuscular disorder [facility])    MS    Past  Surgical History:  Procedure Laterality Date  . TUMOR REMOVAL  1983   abdomen benign    Family History  Adopted: Yes  Problem Relation Age of Onset  . Lupus Mother   . Ataxia Neg Hx   . Chorea Neg Hx   . Dementia Neg Hx   . Mental retardation Neg Hx   . Migraines Neg Hx   . Multiple sclerosis Neg Hx   . Neurofibromatosis Neg Hx   . Neuropathy Neg Hx   . Parkinsonism Neg Hx   . Seizures Neg Hx   . Stroke Neg Hx     Social History   Socioeconomic History  . Marital status: Single    Spouse name: Not on file  . Number of children: Not on file  . Years of education: Not on file  . Highest education level: Not on file  Occupational History  . Not on file  Tobacco Use  . Smoking status: Former Smoker    Packs/day: 0.00  . Smokeless tobacco: Never Used  Vaping Use  . Vaping Use: Former  Substance and Sexual Activity  . Alcohol use: Yes    Comment: once a month  . Drug use: Yes    Frequency: 7.0 times per week    Types: Marijuana  . Sexual activity: Yes    Partners: Female  Other Topics Concern  . Not on file  Social History Narrative  . Not on file   Social Determinants of  Health   Financial Resource Strain:   . Difficulty of Paying Living Expenses: Not on file  Food Insecurity:   . Worried About Programme researcher, broadcasting/film/video in the Last Year: Not on file  . Ran Out of Food in the Last Year: Not on file  Transportation Needs:   . Lack of Transportation (Medical): Not on file  . Lack of Transportation (Non-Medical): Not on file  Physical Activity:   . Days of Exercise per Week: Not on file  . Minutes of Exercise per Session: Not on file  Stress:   . Feeling of Stress : Not on file  Social Connections:   . Frequency of Communication with Friends and Family: Not on file  . Frequency of Social Gatherings with Friends and Family: Not on file  . Attends Religious Services: Not on file  . Active Member of Clubs or Organizations: Not on file  . Attends Tax inspector Meetings: Not on file  . Marital Status: Not on file  Intimate Partner Violence:   . Fear of Current or Ex-Partner: Not on file  . Emotionally Abused: Not on file  . Physically Abused: Not on file  . Sexually Abused: Not on file    ROS Review of Systems  Constitutional: Negative.   Eyes: Negative.   Respiratory: Positive for shortness of breath (smoke black and mild).   Cardiovascular: Negative for chest pain and leg swelling.  Gastrointestinal: Negative for constipation, diarrhea, nausea and vomiting.  Endocrine: Negative.   Genitourinary:       Urinary incontinence occasional due to ambulation   Musculoskeletal: Positive for joint swelling (elbow swelling related to recent fall).       Abnormal gait cane in use   Neurological: Positive for dizziness (occasional), speech difficulty (gradually getting worse) and weakness. Negative for headaches.       Treated by neurology Dr Epimenio Foot  Hematological: Negative.     Objective:   Today's Vitals: BP 118/81 (BP Location: Right Arm, Patient Position: Sitting, Cuff Size: Normal)   Pulse 63   Temp 98 F (36.7 C) (Temporal)   Wt 191 lb (86.6 kg)   SpO2 97%   BMI 26.64 kg/m   Physical Exam Constitutional:      Appearance: He is obese.  HENT:     Head: Normocephalic and atraumatic.     Nose: Nose normal.     Mouth/Throat:     Mouth: Mucous membranes are moist.  Cardiovascular:     Rate and Rhythm: Normal rate and regular rhythm.     Pulses: Normal pulses.     Heart sounds: Normal heart sounds.  Pulmonary:     Effort: Pulmonary effort is normal.     Breath sounds: Normal breath sounds.  Abdominal:     General: Bowel sounds are normal.     Palpations: Abdomen is soft.  Musculoskeletal:     Cervical back: Normal range of motion.     Comments: Ace wrap applied to left elbow to help with swelling  Skin:    General: Skin is warm and dry.     Capillary Refill: Capillary refill takes less than 2 seconds.    Neurological:     General: No focal deficit present.     Mental Status: He is alert and oriented to person, place, and time.     Assessment & Plan:   Problem List Items Addressed This Visit      Other   Bipolar affective disorder (HCC) - Primary Evaluation  for Suboxone use and ongoing treatment for bipolar disorder   Relevant Orders   Ambulatory referral to Psychiatry    Other Visit Diagnoses    Screening for cholesterol level       Relevant Orders   Lipid panel   Encounter for hepatitis C virus screening test for high risk patient       Relevant Orders   Hepatitis C antibody   Screening for HIV (human immunodeficiency virus)       Relevant Orders   HIV Antibody (routine testing w rflx)   Screening for diabetes mellitus       Relevant Orders   Comp. Metabolic Panel (12)   POCT urinalysis dipstick (Completed)   Screening for deficiency anemia       Relevant Orders   CBC with Differential/Platelet   Effusion of bursa of left elbow       Relevant Medications   ketorolac (TORADOL) injection 60 mg (Completed)   Other Relevant Orders   DG Elbow 2 Views Right   DG Elbow Complete Left (Completed)      Outpatient Encounter Medications as of 04/08/2020  Medication Sig  . acetaminophen (TYLENOL) 500 MG tablet Take 500 mg by mouth 2 (two) times daily.  . baclofen (LIORESAL) 20 MG tablet Take 1 tablet (20 mg total) by mouth 3 (three) times daily.  [name] dalfampridine 10 MG TB12 Take 1 tablet by mouth every 12 hours.  . gabapentin (NEURONTIN) 600 MG tablet Take 1 tablet (600 mg total) by mouth 3 (three) times daily.  . natalizumab (TYSABRI) 300 MG/15ML injection Inject 15 mLs (300 mg total) into the vein every 28 (twenty-eight) days.  . tamsulosin (FLOMAX) 0.4 MG CAPS capsule Take 2 capsules (0.8 mg total) by mouth daily.  . Vitamin D, Ergocalciferol, (DRISDOL) 1.25 MG (50000 UNIT) CAPS capsule Take 1 capsule (50,000 Units total) by mouth every 7 (seven) days.  . [DISCONTINUED]  diclofenac (VOLTAREN) 75 MG EC tablet Take 1 tablet (75 mg total) by mouth 2 (two) times daily. (Patient not taking: Reported on 04/08/2020)  . [DISCONTINUED] methylPREDNISolone (MEDROL DOSEPAK) 4 MG TBPK tablet Take 6 tablets on day 1 and decrease by 1 tablet each day until finished (Patient not taking: Reported on 04/08/2020)  . [EXPIRED] ketorolac (TORADOL) injection 60 mg    No facility-administered encounter medications on file as of 04/08/2020.    Follow-up: No follow-ups on file.   Barbette Merino, NP

## 2020-04-09 LAB — CBC WITH DIFFERENTIAL/PLATELET
Basophils Absolute: 0.1 10*3/uL (ref 0.0–0.2)
Basos: 1 %
EOS (ABSOLUTE): 0.2 10*3/uL (ref 0.0–0.4)
Eos: 3 %
Hematocrit: 45.1 % (ref 34.0–46.6)
Hemoglobin: 15.6 g/dL (ref 11.1–15.9)
Immature Grans (Abs): 0 10*3/uL (ref 0.0–0.1)
Immature Granulocytes: 0 %
Lymphocytes Absolute: 4 10*3/uL — ABNORMAL HIGH (ref 0.7–3.1)
Lymphs: 56 %
MCH: 34 pg — ABNORMAL HIGH (ref 26.6–33.0)
MCHC: 34.6 g/dL (ref 31.5–35.7)
MCV: 98 fL — ABNORMAL HIGH (ref 79–97)
Monocytes Absolute: 0.6 10*3/uL (ref 0.1–0.9)
Monocytes: 8 %
NRBC: 1 % — ABNORMAL HIGH (ref 0–0)
Neutrophils Absolute: 2.3 10*3/uL (ref 1.4–7.0)
Neutrophils: 32 %
Platelets: 171 10*3/uL (ref 150–450)
RBC: 4.59 x10E6/uL (ref 3.77–5.28)
RDW: 13.3 % (ref 11.7–15.4)
WBC: 7.3 10*3/uL (ref 3.4–10.8)

## 2020-04-09 LAB — COMP. METABOLIC PANEL (12)
AST: 19 IU/L (ref 0–40)
Albumin/Globulin Ratio: 1.8 (ref 1.2–2.2)
Albumin: 4.5 g/dL (ref 3.8–4.8)
Alkaline Phosphatase: 79 IU/L (ref 44–121)
BUN/Creatinine Ratio: 18 (ref 9–23)
BUN: 19 mg/dL (ref 6–24)
Bilirubin Total: 0.4 mg/dL (ref 0.0–1.2)
Calcium: 9.7 mg/dL (ref 8.7–10.2)
Chloride: 100 mmol/L (ref 96–106)
Creatinine, Ser: 1.05 mg/dL — ABNORMAL HIGH (ref 0.57–1.00)
GFR calc Af Amer: 76 mL/min/{1.73_m2} (ref >59)
GFR calc non Af Amer: 66 mL/min/{1.73_m2} (ref >59)
Globulin, Total: 2.5 g/dL (ref 1.5–4.5)
Glucose: 91 mg/dL (ref 65–99)
Potassium: 4.5 mmol/L (ref 3.5–5.2)
Sodium: 140 mmol/L (ref 134–144)
Total Protein: 7 g/dL (ref 6.0–8.5)

## 2020-04-09 LAB — LIPID PANEL
Chol/HDL Ratio: 5.1 ratio — ABNORMAL HIGH (ref 0.0–4.4)
Cholesterol, Total: 194 mg/dL (ref 100–199)
HDL: 38 mg/dL — ABNORMAL LOW (ref >39)
LDL Chol Calc (NIH): 136 mg/dL — ABNORMAL HIGH (ref 0–99)
Triglycerides: 110 mg/dL (ref 0–149)
VLDL Cholesterol Cal: 20 mg/dL (ref 5–40)

## 2020-04-09 LAB — HIV ANTIBODY (ROUTINE TESTING W REFLEX): HIV Screen 4th Generation wRfx: NONREACTIVE

## 2020-04-09 LAB — HEPATITIS C ANTIBODY: Hep C Virus Ab: 0.3 s/co ratio (ref 0.0–0.9)

## 2020-04-12 ENCOUNTER — Encounter: Payer: Self-pay | Admitting: Nurse Practitioner

## 2020-04-13 ENCOUNTER — Other Ambulatory Visit: Payer: Self-pay

## 2020-04-13 ENCOUNTER — Ambulatory Visit (HOSPITAL_COMMUNITY)
Admission: RE | Admit: 2020-04-13 | Discharge: 2020-04-13 | Disposition: A | Discharge status: Home / Self Care | Payer: Medicaid Other | Source: Ambulatory Visit | Attending: Neurology | Admitting: Neurology

## 2020-04-13 DIAGNOSIS — G35 Multiple sclerosis: Secondary | ICD-10-CM | POA: Diagnosis present

## 2020-04-13 MED ORDER — SODIUM CHLORIDE 0.9 % IV SOLN
300.0000 mg | INTRAVENOUS | Status: DC
  Administered 2020-04-13: 300 mg via INTRAVENOUS
  Filled 2020-04-13: qty 15

## 2020-04-13 MED ORDER — SODIUM CHLORIDE 0.9 % IV SOLN
INTRAVENOUS | Status: DC | PRN
  Administered 2020-04-13: 250 mL via INTRAVENOUS

## 2020-04-13 NOTE — Progress Notes (Signed)
PATIENT CARE CENTER NOTE  Diagnosis:Multiple Sclerosis   Provider:Sater, Gerlene Burdock, MD   Procedure:Tysabri IV   Note:Patient received Tysabri infusionvia PIV. Tolerated infusion well with no adverse reaction. Vital signs stable. Discharge instructions given. Patientdeclinedto stay for the1 hour post-infusion forobservation. Alert, oriented and ambulatory at discharge.

## 2020-04-13 NOTE — Discharge Instructions (Signed)
Natalizumab injection What is this medicine? NATALIZUMAB (na ta LIZ you mab) is used to treat relapsing multiple sclerosis. This drug is not a cure. It is also used to treat Crohn's disease. This medicine may be used for other purposes; ask your health care provider or pharmacist if you have questions. COMMON BRAND NAME(S): Tysabri What should I tell my health care provider before I take this medicine? They need to know if you have any of these conditions:  immune system problems  progressive multifocal leukoencephalopathy (PML)  an unusual or allergic reaction to natalizumab, other medicines, foods, dyes, or preservatives  pregnant or trying to get pregnant  breast-feeding How should I use this medicine? This medicine is for infusion into a vein. It is given by a health care professional in a hospital or clinic setting. A special MedGuide will be given to you by the pharmacist with each prescription and refill. Be sure to read this information carefully each time. Talk to your pediatrician regarding the use of this medicine in children. This medicine is not approved for use in children. Overdosage: If you think you have taken too much of this medicine contact a poison control center or emergency room at once. NOTE: This medicine is only for you. Do not share this medicine with others. What if I miss a dose? It is important not to miss your dose. Call your doctor or health care professional if you are unable to keep an appointment. What may interact with this medicine? Do not take this medicine with any of the following medications:  biologic medicines such as adalimumab, certolizumab, etanercept, golimumab, infliximab This medicine may also interact with the following medications:  azathioprine  cyclosporine  interferons  6-mercaptopurine  methotrexate  other medicines that lower your chance of fighting an infection  steroid medicines like prednisone or  cortisone  vaccines This list may not describe all possible interactions. Give your health care provider a list of all the medicines, herbs, non-prescription drugs, or dietary supplements you use. Also tell them if you smoke, drink alcohol, or use illegal drugs. Some items may interact with your medicine. What should I watch for while using this medicine? Your condition will be monitored carefully while you are receiving this medicine. Visit your doctor for regular check ups. Tell your doctor or healthcare professional if your symptoms do not start to get better or if they get worse. Stay away from people who are sick. Call your doctor or health care professional for advice if you get a fever, chills or sore throat, or other symptoms of a cold or flu. Do not treat yourself. In some patients, this medicine may cause a serious brain infection that may cause death. If you have any problems seeing, thinking, speaking, walking, or standing, tell your doctor right away. If you cannot reach your doctor, get urgent medical care. What side effects may I notice from receiving this medicine? Side effects that you should report to your doctor or health care professional as soon as possible:  allergic reactions like skin rash, itching or hives, swelling of the face, lips, or tongue  breathing problems  changes in vision  chest pain  confusion  depressed mood  dizziness  feeling faint; lightheaded; falls  general ill feeling or flu-like symptoms  loss of memory  missed menstrual periods  muscle weakness  problems with balance, talking, or walking  signs and symptoms of liver injury like dark yellow or brown urine; general ill feeling or flu-like symptoms; light-colored   stools; loss of appetite; nausea; right upper belly pain; unusually weak or tired; yellowing of the eyes or skin  suicidal thoughts, mood changes  unusual bruising or bleeding  unusually weak or tired Side effects that  usually do not require medical attention (report to your doctor or health care professional if they continue or are bothersome):  headache  joint pain  muscle cramps  muscle pain  nausea, vomiting  pain, redness, or irritation at site where injected  tiredness This list may not describe all possible side effects. Call your doctor for medical advice about side effects. You may report side effects to FDA at 1-800-FDA-1088. Where should I keep my medicine? This drug is given in a hospital or clinic and will not be stored at home. NOTE: This sheet is a summary. It may not cover all possible information. If you have questions about this medicine, talk to your doctor, pharmacist, or health care provider.  2020 Elsevier/Gold Standard (2018-12-09 13:20:26)  

## 2020-04-21 ENCOUNTER — Ambulatory Visit: Payer: 59 | Admitting: Family Medicine

## 2020-04-23 ENCOUNTER — Ambulatory Visit: Payer: 59 | Admitting: Family Medicine

## 2020-04-27 ENCOUNTER — Ambulatory Visit: Payer: Medicaid Other | Admitting: Family Medicine

## 2020-04-27 ENCOUNTER — Other Ambulatory Visit: Payer: Self-pay

## 2020-05-07 ENCOUNTER — Encounter (HOSPITAL_COMMUNITY): Payer: Medicaid Other

## 2020-05-11 ENCOUNTER — Encounter (HOSPITAL_COMMUNITY): Payer: 59

## 2020-05-17 ENCOUNTER — Ambulatory Visit (HOSPITAL_COMMUNITY)
Admission: RE | Admit: 2020-05-17 | Discharge: 2020-05-17 | Disposition: A | Discharge status: Home / Self Care | Payer: 59 | Source: Ambulatory Visit | Attending: Neurology | Admitting: Neurology

## 2020-05-17 ENCOUNTER — Other Ambulatory Visit: Payer: Self-pay

## 2020-05-17 DIAGNOSIS — G35 Multiple sclerosis: Secondary | ICD-10-CM | POA: Insufficient documentation

## 2020-05-17 MED ORDER — SODIUM CHLORIDE 0.9 % IV SOLN
INTRAVENOUS | Status: DC | PRN
  Administered 2020-05-17: 250 mL via INTRAVENOUS

## 2020-05-17 MED ORDER — SODIUM CHLORIDE 0.9 % IV SOLN
300.0000 mg | INTRAVENOUS | Status: DC
  Administered 2020-05-17: 300 mg via INTRAVENOUS
  Filled 2020-05-17: qty 15

## 2020-05-17 NOTE — Discharge Instructions (Signed)
Natalizumab injection What is this medicine? NATALIZUMAB (na ta LIZ you mab) is used to treat relapsing multiple sclerosis. This drug is not a cure. It is also used to treat Crohn's disease. This medicine may be used for other purposes; ask your health care provider or pharmacist if you have questions. COMMON BRAND NAME(S): Tysabri What should I tell my health care provider before I take this medicine? They need to know if you have any of these conditions:  immune system problems  progressive multifocal leukoencephalopathy (PML)  an unusual or allergic reaction to natalizumab, other medicines, foods, dyes, or preservatives  pregnant or trying to get pregnant  breast-feeding How should I use this medicine? This medicine is for infusion into a vein. It is given by a health care professional in a hospital or clinic setting. A special MedGuide will be given to you by the pharmacist with each prescription and refill. Be sure to read this information carefully each time. Talk to your pediatrician regarding the use of this medicine in children. This medicine is not approved for use in children. Overdosage: If you think you have taken too much of this medicine contact a poison control center or emergency room at once. NOTE: This medicine is only for you. Do not share this medicine with others. What if I miss a dose? It is important not to miss your dose. Call your doctor or health care professional if you are unable to keep an appointment. What may interact with this medicine? Do not take this medicine with any of the following medications:  biologic medicines such as adalimumab, certolizumab, etanercept, golimumab, infliximab This medicine may also interact with the following medications:  azathioprine  cyclosporine  interferons  6-mercaptopurine  methotrexate  other medicines that lower your chance of fighting an infection  steroid medicines like prednisone or  cortisone  vaccines This list may not describe all possible interactions. Give your health care provider a list of all the medicines, herbs, non-prescription drugs, or dietary supplements you use. Also tell them if you smoke, drink alcohol, or use illegal drugs. Some items may interact with your medicine. What should I watch for while using this medicine? Your condition will be monitored carefully while you are receiving this medicine. Visit your doctor for regular check ups. Tell your doctor or healthcare professional if your symptoms do not start to get better or if they get worse. Stay away from people who are sick. Call your doctor or health care professional for advice if you get a fever, chills or sore throat, or other symptoms of a cold or flu. Do not treat yourself. In some patients, this medicine may cause a serious brain infection that may cause death. If you have any problems seeing, thinking, speaking, walking, or standing, tell your doctor right away. If you cannot reach your doctor, get urgent medical care. What side effects may I notice from receiving this medicine? Side effects that you should report to your doctor or health care professional as soon as possible:  allergic reactions like skin rash, itching or hives, swelling of the face, lips, or tongue  breathing problems  changes in vision  chest pain  confusion  depressed mood  dizziness  feeling faint; lightheaded; falls  general ill feeling or flu-like symptoms  loss of memory  missed menstrual periods  muscle weakness  problems with balance, talking, or walking  signs and symptoms of liver injury like dark yellow or brown urine; general ill feeling or flu-like symptoms; light-colored   stools; loss of appetite; nausea; right upper belly pain; unusually weak or tired; yellowing of the eyes or skin  suicidal thoughts, mood changes  unusual bruising or bleeding  unusually weak or tired Side effects that  usually do not require medical attention (report to your doctor or health care professional if they continue or are bothersome):  headache  joint pain  muscle cramps  muscle pain  nausea, vomiting  pain, redness, or irritation at site where injected  tiredness This list may not describe all possible side effects. Call your doctor for medical advice about side effects. You may report side effects to FDA at 1-800-FDA-1088. Where should I keep my medicine? This drug is given in a hospital or clinic and will not be stored at home. NOTE: This sheet is a summary. It may not cover all possible information. If you have questions about this medicine, talk to your doctor, pharmacist, or health care provider.  2020 Elsevier/Gold Standard (2018-12-09 13:20:26)  

## 2020-05-17 NOTE — Progress Notes (Signed)
  CENTER NOTE   Diagnosis: Multiple Sclerosis       Provider: Despina Arias, MD     Procedure: Donnamarie Poag IV     Note: Patient received Tysabri infusion via PIV. Tolerated infusion well with no adverse reaction. Vital signs stable. Discharge instructions given. Patient declined to stay for the 1 hour post-infusion observation. Alert, oriented and ambulatory at discharge.

## 2020-05-20 ENCOUNTER — Ambulatory Visit: Payer: Medicaid Other | Attending: Internal Medicine

## 2020-05-20 DIAGNOSIS — Z23 Encounter for immunization: Secondary | ICD-10-CM

## 2020-05-20 NOTE — Progress Notes (Signed)
   Covid-19 Vaccination Clinic  Name:  Bradley Brandt    MRN: [id number] DOB: 1980-12-28  05/20/2020  Mr. [name] was observed post Covid-19 immunization for 15 minutes without incident. He was provided with Vaccine Information Sheet and instruction to access the V-Safe system.   Mr. [name] was instructed to call 911 with any severe reactions post vaccine: [name] Difficulty breathing  . Swelling of face and throat  . A fast heartbeat  . A bad rash all over body  . Dizziness and weakness   Immunizations Administered    No immunizations on file.

## 2020-05-26 ENCOUNTER — Other Ambulatory Visit: Payer: Self-pay

## 2020-05-26 ENCOUNTER — Telehealth: Payer: Self-pay | Admitting: *Deleted

## 2020-05-26 ENCOUNTER — Encounter: Payer: Self-pay | Admitting: Neurology

## 2020-05-26 ENCOUNTER — Ambulatory Visit: Payer: 59 | Admitting: Neurology

## 2020-05-26 VITALS — BP 128/68 | HR 61 | Ht 71.0 in | Wt 189.8 lb

## 2020-05-26 DIAGNOSIS — G35 Multiple sclerosis: Secondary | ICD-10-CM | POA: Diagnosis not present

## 2020-05-26 DIAGNOSIS — R261 Paralytic gait: Secondary | ICD-10-CM | POA: Diagnosis not present

## 2020-05-26 DIAGNOSIS — Z79899 Other long term (current) drug therapy: Secondary | ICD-10-CM

## 2020-05-26 DIAGNOSIS — F3170 Bipolar disorder, currently in remission, most recent episode unspecified: Secondary | ICD-10-CM

## 2020-05-26 MED ORDER — MIRTAZAPINE 15 MG PO TABS: 15.0000 mg | ORAL_TABLET | Freq: Every day | ORAL | 5 refills | Status: DC

## 2020-05-26 NOTE — Telephone Encounter (Signed)
Pl[facility] lab in [facility] lock box for routine lab pick up. Results pending. 

## 2020-05-26 NOTE — Progress Notes (Signed)
[name]  PATIENT: Bradley Brandt DOB: [birth year]  REFERRING DOCTOR OR PCP:  Thomes Dinning SOURCE: patient, records form Dr. Gaynelle Adu, MRI reports and images on PACS  _________________________________   HISTORICAL  CHIEF COMPLAINT:  Chief Complaint  Patient presents with  . Follow-up    RM 12, alone. Last seen 12/08/2019. Pt reports he has lost a lot of weight.   . Multiple Sclerosis    On Tysabri. Receives at [facility]. Last: 05/17/20. Next: 06/16/20. Last JCV 12/08/19 indeterminate, index: 0.20. Inhibition assay: negative. NEEDS LABS TODAY  . Gait Problem    Ambulates with cane. Has had about 4-5 falls since last seen. Denies hitting head. Has bruises on elbows.     HISTORY OF PRESENT ILLNESS:  Bradley Brandt is a 39 y.o.  man with relapsing remitting multiple sclerosis.     Update 05/26/2020:   He is on Tysabri and gets infusions at [facility].   He tolerates it well.   Last JCV Ab was negative at 0.20 on 12/08/2019.    He has lot 8 pounds since the last visit.   He is concerned aboutt the weight loss though current weight is 189.    He feels he has been neurologically stable for the most part but he notes gait is a little worse since he lost some weighthas reduced gait due to balance, strength and spasticity.  He is on baclofen for spasticity and dalfampridine for the walking.   Vision is doing well and he has new glasses.    He has urinary hesitancy helped by tamsulosin.  No bladder infections.     He feels fatigued.   He feels he has more depression than earlier this year.  He has more anxiety.   He is going to start seeing psychiatry again.   He was diagnosed with bipolar disease   He is sleeping worse.[name]   He notes cognition is about the same.  Focus is sometimes poor.    He has had his Covid-19 vaccinations and booster.      MS history: Mann was diagnosed around 2001 when he presented with right-sided numbness and clumsiness and  weakness.   Initially, he was on Avonex and then Rebif but had relapses on both of them.  He could not tolerate Tecfidera.   He was started on Tysabri about 1 year ago and his last infusion was at [facility] at 11/08/2015.  He was seen Dr. Gaynelle Adu [facility] and those records were reviewed. He did receive a course of Solu-Medrol March 2015 and another course of Solu-Medrol January 2016 when he presented with left leg weakness and numbness that was new    An MRI of the brain 08/28/2013 personally reviewed shows old extensive white matter changes in both hemispheres with multiple sclerosis. There were new areas of restricted diffusion without definite enhancement that could be consistent with ongoing demyelination when compared to the MRI from 05/13/2013.  Imaging: MRI of the brain 08/30/2019 showed multiple T2/FLAIR hyperintense foci in the hemispheres, pons, cerebellum and thalamus in a pattern and configuration consistent with chronic demyelinating plaque associated with multiple sclerosis.  None of the foci appear to be acute and they do not enhance.  Compared to the MRI dated 01/14/2019, there is no interval progression.  MRI of the cervical spine 01/06/2018 showed T2 hyperintense foci within the spinal cord posteriorly adjacent to C2, to the right adjacent to C3, to the left adjacent to C4  and centrally and to the right adjacent to C4-C5 and C5.   None of these appear to be acute and they were all present on the 2015 MRI.[name]    MRI of the brain 01/06/2018 showed multiple T2/FLAIR hyperintense foci in the hemispheres and in the left middle cerebellar peduncle consistent with chronic demyelinating plaque associated with multiple sclerosis.  None of the foci appears to be acute.  None of them enhance.  However, when compared to the 2015 MRI focus in the cerebellum and a couple foci in the hemispheres were not clearly present on the earlier MRI.    REVIEW OF SYSTEMS: Constitutional: No  fevers, chills, sweats, or change in appetite.   Notes fatigue Eyes: No visual changes, double vision, eye pain Ear, nose and throat: No hearing loss, ear pain, nasal congestion, sore throat Cardiovascular: No chest pain, palpitations Respiratory: No shortness of breath at rest or with exertion.   No wheezes GastrointestinaI: No nausea, vomiting, diarrhea, abdominal pain, fecal incontinence Genitourinary: He has hesitancy and frequency.   No UTI's. Musculoskeletal: No neck pain, back pain Integumentary: No rash, pruritus, skin lesions Neurological: as above Psychiatric: No depression at this time.  No anxiety Endocrine: No palpitations, diaphoresis, change in appetite, change in weigh or increased thirst Hematologic/Lymphatic: No anemia, purpura, petechiae. Allergic/Immunologic: No itchy/runny eyes, nasal congestion, recent allergic reactions, rashes  ALLERGIES: Allergies  Allergen Reactions  . Lithium Rash    HOME MEDICATIONS:  Current Outpatient Medications:  .  acetaminophen (TYLENOL) 500 MG tablet, Take 500 mg by mouth 2 (two) times daily., Disp: , Rfl:  .  baclofen (LIORESAL) 20 MG tablet, Take 1 tablet (20 mg total) by mouth 3 (three) times daily., Disp: 90 tablet, Rfl: 11 .  dalfampridine 10 MG TB12, Take 1 tablet by mouth every 12 hours., Disp: 60 tablet, Rfl: 11 .  gabapentin (NEURONTIN) 600 MG tablet, Take 1 tablet (600 mg total) by mouth 3 (three) times daily., Disp: 90 tablet, Rfl: 11 .  natalizumab (TYSABRI) 300 MG/15ML injection, Inject 15 mLs (300 mg total) into the vein every 28 (twenty-eight) days., Disp: 15 mL, Rfl: 5 .  tamsulosin (FLOMAX) 0.4 MG CAPS capsule, Take 2 capsules (0.8 mg total) by mouth daily., Disp: 60 capsule, Rfl: 11 .  Vitamin D, Ergocalciferol, (DRISDOL) 1.25 MG (50000 UNIT) CAPS capsule, Take 1 capsule (50,000 Units total) by mouth every 7 (seven) days., Disp: 13 capsule, Rfl: 3 .  mirtazapine (REMERON) 15 MG tablet, Take 1 tablet (15 mg total)  by mouth at bedtime., Disp: 30 tablet, Rfl: 5  PAST MEDICAL HISTORY: Past Medical History:  Diagnosis Date  . Bipolar 1 disorder (HCC)   . Bipolar disorder, unspecified (HCC) 01/03/2013  . MS (multiple sclerosis) (HCC) dx 2006   relapsing-remitting right sided weakness  . Neuromuscular disorder (HCC)    MS    PAST SURGICAL HISTORY: Past Surgical History:  Procedure Laterality Date  . TUMOR REMOVAL  1983   abdomen benign    FAMILY HISTORY: Family History  Adopted: Yes  Problem Relation Age of Onset  . Lupus Mother   . Ataxia Neg Hx   . Chorea Neg Hx   . Dementia Neg Hx   . Mental retardation Neg Hx   . Migraines Neg Hx   . Multiple sclerosis Neg Hx   . Neurofibromatosis Neg Hx   . Neuropathy Neg Hx   . Parkinsonism Neg Hx   . Seizures Neg Hx   . Stroke Neg Hx  SOCIAL HISTORY:  Social History   Socioeconomic History  . Marital status: Single    Spouse name: Not on file  . Number of children: Not on file  . Years of education: Not on file  . Highest education level: Not on file  Occupational History  . Not on file  Tobacco Use  . Smoking status: Former Smoker    Packs/day: 0.00  . Smokeless tobacco: Never Used  Vaping Use  . Vaping Use: Former  Substance and Sexual Activity  . Alcohol use: Yes    Comment: once a month  . Drug use: Yes    Frequency: 7.0 times per week    Types: Marijuana  . Sexual activity: Yes    Partners: Female  Other Topics Concern  . Not on file  Social History Narrative  . Not on file   Social Determinants of Health   Financial Resource Strain:   . Difficulty of Paying Living Expenses: Not on file  Food Insecurity:   . Worried About Programme researcher, broadcasting/film/video in the Last Year: Not on file  . Ran Out of Food in the Last Year: Not on file  Transportation Needs:   . Lack of Transportation (Medical): Not on file  . Lack of Transportation (Non-Medical): Not on file  Physical Activity:   . Days of Exercise per Week: Not on file   . Minutes of Exercise per Session: Not on file  Stress:   . Feeling of Stress : Not on file  Social Connections:   . Frequency of Communication with Friends and Family: Not on file  . Frequency of Social Gatherings with Friends and Family: Not on file  . Attends Religious Services: Not on file  . Active Member of Clubs or Organizations: Not on file  . Attends Banker Meetings: Not on file  . Marital Status: Not on file  Intimate Partner Violence:   . Fear of Current or Ex-Partner: Not on file  . Emotionally Abused: Not on file  . Physically Abused: Not on file  . Sexually Abused: Not on file     PHYSICAL EXAM  Vitals:   05/26/20 1429  BP: 128/68  Pulse: 61  Weight: 189 lb 12.8 oz (86.1 kg)  Height: 5\' 11"  (1.803 m)    Body mass index is 26.47 kg/m.   General: The patient is well-developed and well-nourished and in no acute distress.   Neurologic Exam  Mental status: The patient is alert and oriented x 3 at the time of the examination. The patient has apparent normal recent and remote memory, with an apparently normal attention span and concentration ability.   Speech is normal.  Cranial nerves: Extraocular movements are full.   2+ right APD.   Reduced visual acuity and reduced color vision OD     Facial strength is normal. Trapezius strength is strong.  No obvious hearing deficits are noted.  Motor:  Muscle bulk is normal.   Left rapid altering movements are slow in the hand.  There is increased muscle tone in the left arm and both legs, left greater than right.  Strength is 5/5 in the right arm, 4+/5 in the left arm.  And 4- to 4/5 in the right leg and 4/5 in the left leg.  Sensory: He has reduced vibration sensation in the left leg but reduced touch/temp on the right.    Coordination: Cerebellar testing reveals mildly reduced left finger-nose-finger and mildly ataxic heel-to-shin bilaterally.  Gait and station: Station  is normal.   He has a spastic  gait.  He can take steps without his cane but walks faster with it.    He cannot tandem walk.   Romberg is borderline  Reflexes: Deep tendon reflexes are increased with spread at the knees.  He has nonsustained clonus at the ankles      ____________________________________________  Relapsing remitting multiple sclerosis (HCC) - Plan: CBC with Differential/Platelet, Stratify JCV Antibody Test ([facility]), HIV Antibody (routine testing w rflx)  Bipolar disorder in full remission, most recent episode unspecified type (HCC)  High risk medication use  Spastic gait  1.   Continue Tysabri.  His MS has been very stable on Tysabri with no recent exacerbation and no new lesions on MRI.  Check JCV antibody, CBC and vitamin D. 2.   He will continue Ampyra 10 mg twice daily for gait and baclofen for spasticity 3.   Continue gabapentin for dysesthesia and back pain. 4.   He is concerned about weight loss but has already lost 8 pounds since the last visit.   Mirtazapine to help with insomnia.  It may also help him stabilize his weight. 5.   Return in 6 months or sooner if new or worsening neurologic symptoms.    Hobert Poplaski A. Epimenio Foot, MD, PhD 05/26/2020, 3:14 PM Certified in Neurology, Clinical Neurophysiology, Sleep Medicine, Pain Medicine and Neuroimaging  [facility] [address] [place], [place] [address] [phone]

## 2020-05-27 ENCOUNTER — Telehealth: Payer: Self-pay | Admitting: *Deleted

## 2020-05-27 LAB — CBC WITH DIFFERENTIAL/PLATELET
Basophils Absolute: 0 10*3/uL (ref 0.0–0.2)
Basos: 1 %
EOS (ABSOLUTE): 0.2 10*3/uL (ref 0.0–0.4)
Eos: 2 %
Hematocrit: 46.1 % (ref 37.5–51.0)
Hemoglobin: 15.7 g/dL (ref 13.0–17.7)
Immature Grans (Abs): 0.1 10*3/uL (ref 0.0–0.1)
Immature Granulocytes: 1 %
Lymphocytes Absolute: 4.1 10*3/uL — ABNORMAL HIGH (ref 0.7–3.1)
Lymphs: 46 %
MCH: 32.2 pg (ref 26.6–33.0)
MCHC: 34.1 g/dL (ref 31.5–35.7)
MCV: 95 fL (ref 79–97)
Monocytes Absolute: 0.5 10*3/uL (ref 0.1–0.9)
Monocytes: 6 %
NRBC: 2 % — ABNORMAL HIGH (ref 0–0)
Neutrophils Absolute: 3.8 10*3/uL (ref 1.4–7.0)
Neutrophils: 44 %
Platelets: 213 10*3/uL (ref 150–450)
RBC: 4.88 x10E6/uL (ref 4.14–5.80)
RDW: 13.4 % (ref 11.6–15.4)
WBC: 8.7 10*3/uL (ref 3.4–10.8)

## 2020-05-27 LAB — HIV ANTIBODY (ROUTINE TESTING W REFLEX): HIV Screen 4th Generation wRfx: NONREACTIVE

## 2020-05-27 NOTE — Telephone Encounter (Signed)
Late entry from 05/26/20: Called pt to verify insurance on file after seen in office. Pt no longer has medicaid per pt. 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 I was able to have him upload front/back of insurance cards. He does not have [employer] either. I called [employer] and spoke with rep. Verified pt has coverage. She directed me to website to print PA form to complete new PA for Tysabri. Advised it can take 14 days to be reviewed. I asked about urgent review and she says I can mark it urgent but a nurse will review to determine if case it urgent or not.  I faxed completed/signed PA form with office notes to [employer] today at [phone]. Received fax confirmation. Marked urgent. Waiting on determination.

## 2020-06-01 NOTE — Telephone Encounter (Signed)
Called [employer] and spoke with Marla L. She checked on status of PA for Tysabri. Advised no PA required/they can do buy/bill. I faxed new orders to [facility] at [phone]. Received fax confirmation.

## 2020-06-01 NOTE — Telephone Encounter (Signed)
JCV drawn on 05/26/20 negative, index: 0.16

## 2020-06-09 ENCOUNTER — Encounter (HOSPITAL_COMMUNITY): Payer: Self-pay | Admitting: Physician Assistant

## 2020-06-09 ENCOUNTER — Ambulatory Visit (INDEPENDENT_AMBULATORY_CARE_PROVIDER_SITE_OTHER): Payer: 59 | Admitting: Physician Assistant

## 2020-06-09 ENCOUNTER — Other Ambulatory Visit: Payer: Self-pay

## 2020-06-09 VITALS — BP 122/81 | HR 56 | Wt 197.0 lb

## 2020-06-09 DIAGNOSIS — F411 Generalized anxiety disorder: Secondary | ICD-10-CM | POA: Diagnosis not present

## 2020-06-09 DIAGNOSIS — G4709 Other insomnia: Secondary | ICD-10-CM | POA: Diagnosis not present

## 2020-06-09 DIAGNOSIS — F319 Bipolar disorder, unspecified: Secondary | ICD-10-CM | POA: Diagnosis not present

## 2020-06-09 MED ORDER — HYDROXYZINE HCL 25 MG PO TABS: 25.0000 mg | ORAL_TABLET | Freq: Three times a day (TID) | ORAL | 1 refills | Status: DC | PRN

## 2020-06-09 MED ORDER — QUETIAPINE FUMARATE 50 MG PO TABS: 50.0000 mg | ORAL_TABLET | Freq: Every day | ORAL | 2 refills | Status: DC
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
## 2020-06-16 ENCOUNTER — Other Ambulatory Visit: Payer: Self-pay

## 2020-06-16 ENCOUNTER — Ambulatory Visit (HOSPITAL_COMMUNITY)
Admission: RE | Admit: 2020-06-16 | Discharge: 2020-06-16 | Disposition: A | Discharge status: Home / Self Care | Payer: 59 | Source: Ambulatory Visit | Attending: Neurology | Admitting: Neurology

## 2020-06-16 DIAGNOSIS — G35 Multiple sclerosis: Secondary | ICD-10-CM | POA: Diagnosis present

## 2020-06-16 MED ORDER — SODIUM CHLORIDE 0.9 % IV SOLN
INTRAVENOUS | Status: DC | PRN
  Administered 2020-06-16: 250 mL via INTRAVENOUS

## 2020-06-16 MED ORDER — SODIUM CHLORIDE 0.9 % IV SOLN
300.0000 mg | INTRAVENOUS | Status: DC
  Administered 2020-06-16: 300 mg via INTRAVENOUS
  Filled 2020-06-16: qty 15

## 2020-06-16 NOTE — Progress Notes (Signed)
PATIENT CARE CENTER NOTE  Diagnosis:Multiple Sclerosis   Provider:Sater, Richard, MD   Procedure:Tysabri IV   Note:Patient received Tysabri infusionvia PIV. Tolerated infusion well with no adverse reaction. Vital signs stable. Discharge instructions given. Patientdeclinedto stay for the1 hour post-infusion forobservation. Alert, oriented and ambulatory at discharge. 

## 2020-06-16 NOTE — Discharge Instructions (Signed)
Natalizumab injection What is this medicine? NATALIZUMAB (na ta LIZ you mab) is used to treat relapsing multiple sclerosis. This drug is not a cure. It is also used to treat Crohn's disease. This medicine may be used for other purposes; ask your health care provider or pharmacist if you have questions. COMMON BRAND NAME(S): Tysabri What should I tell my health care provider before I take this medicine? They need to know if you have any of these conditions:  immune system problems  progressive multifocal leukoencephalopathy (PML)  an unusual or allergic reaction to natalizumab, other medicines, foods, dyes, or preservatives  pregnant or trying to get pregnant  breast-feeding How should I use this medicine? This medicine is for infusion into a vein. It is given by a health care professional in a hospital or clinic setting. A special MedGuide will be given to you by the pharmacist with each prescription and refill. Be sure to read this information carefully each time. Talk to your pediatrician regarding the use of this medicine in children. This medicine is not approved for use in children. Overdosage: If you think you have taken too much of this medicine contact a poison control center or emergency room at once. NOTE: This medicine is only for you. Do not share this medicine with others. What if I miss a dose? It is important not to miss your dose. Call your doctor or health care professional if you are unable to keep an appointment. What may interact with this medicine? Do not take this medicine with any of the following medications:  biologic medicines such as adalimumab, certolizumab, etanercept, golimumab, infliximab This medicine may also interact with the following medications:  azathioprine  cyclosporine  interferons  6-mercaptopurine  methotrexate  other medicines that lower your chance of fighting an infection  steroid medicines like prednisone or  cortisone  vaccines This list may not describe all possible interactions. Give your health care provider a list of all the medicines, herbs, non-prescription drugs, or dietary supplements you use. Also tell them if you smoke, drink alcohol, or use illegal drugs. Some items may interact with your medicine. What should I watch for while using this medicine? Your condition will be monitored carefully while you are receiving this medicine. Visit your doctor for regular check ups. Tell your doctor or healthcare professional if your symptoms do not start to get better or if they get worse. Stay away from people who are sick. Call your doctor or health care professional for advice if you get a fever, chills or sore throat, or other symptoms of a cold or flu. Do not treat yourself. In some patients, this medicine may cause a serious brain infection that may cause death. If you have any problems seeing, thinking, speaking, walking, or standing, tell your doctor right away. If you cannot reach your doctor, get urgent medical care. What side effects may I notice from receiving this medicine? Side effects that you should report to your doctor or health care professional as soon as possible:  allergic reactions like skin rash, itching or hives, swelling of the face, lips, or tongue  breathing problems  changes in vision  chest pain  confusion  depressed mood  dizziness  feeling faint; lightheaded; falls  general ill feeling or flu-like symptoms  loss of memory  missed menstrual periods  muscle weakness  problems with balance, talking, or walking  signs and symptoms of liver injury like dark yellow or brown urine; general ill feeling or flu-like symptoms; light-colored   stools; loss of appetite; nausea; right upper belly pain; unusually weak or tired; yellowing of the eyes or skin  suicidal thoughts, mood changes  unusual bruising or bleeding  unusually weak or tired Side effects that  usually do not require medical attention (report to your doctor or health care professional if they continue or are bothersome):  headache  joint pain  muscle cramps  muscle pain  nausea, vomiting  pain, redness, or irritation at site where injected  tiredness This list may not describe all possible side effects. Call your doctor for medical advice about side effects. You may report side effects to FDA at 1-800-FDA-1088. Where should I keep my medicine? This drug is given in a hospital or clinic and will not be stored at home. NOTE: This sheet is a summary. It may not cover all possible information. If you have questions about this medicine, talk to your doctor, pharmacist, or health care provider.  2020 Elsevier/Gold Standard (2018-12-09 13:20:26)  

## 2020-06-17 ENCOUNTER — Other Ambulatory Visit: Payer: Self-pay | Admitting: Neurology

## 2020-07-05 ENCOUNTER — Telehealth (HOSPITAL_COMMUNITY): Payer: Self-pay

## 2020-07-05 NOTE — Telephone Encounter (Signed)
Medication managment - Telephone call with patient, then his [facility] with Zollie Scale, pharmacist and then back to pt to assist him with arranging refills of Seroquel and Hydroxyzine today, from 06/09/20 orders.  Collateral was able to override safety questions from [facility] with Medicaid to fill his Seroquel for this one time.  Patient will need these sent into [facility] by next filled order and also verified they were filling patient's ordered Hydroxyzine for 30 days. Collateral reported patient may want 90 day orders later and discussed with collateral and then patient plan for him to keep his next appointment on 07/21/20 for all new orders then.  Agreed to inform Otila Back, PA-C that patient would need safety documentation to be done with [facility] for continued Seroquel orders.  Patient was thankful for orders being filled today and will call back if any problems prior to next appointment on 07/21/20.

## 2020-07-06 ENCOUNTER — Telehealth (HOSPITAL_COMMUNITY): Payer: Self-pay | Admitting: *Deleted

## 2020-07-06 NOTE — Telephone Encounter (Signed)
Prior authorization received for Quetiapine . Prior auth dates are: 07/06/20-07/06/21.

## 2020-07-06 NOTE — Telephone Encounter (Signed)
Message received from originator (Shawn V. Ladona Ridgel, RN). Safety documentation will be performed with [facility] for continued Seroquel orders. Patient's next appointment will occur on February 2nd, 2022.

## 2020-07-12 ENCOUNTER — Telehealth: Payer: Self-pay | Admitting: Neurology

## 2020-07-12 DIAGNOSIS — G35 Multiple sclerosis: Secondary | ICD-10-CM

## 2020-07-12 NOTE — Telephone Encounter (Signed)
Called patient back to further discuss. He had multiple falls this past Sat. He was trying to walk. Denies hitting head/significant injury. Feels he cannot walk like he used to and feels he needs WC. Still taking Ampyra, denies any type of infection. Started hydroxyzine 25mg  po TID prn for anxiety last month.  Last Tysabri infusion 06/16/20, has not scheduled next infusion yet. He was out walking dog, could not take [facility] phone # during call. He will take when I call back after speaking with MD.  Last JCV ab 05/26/20 negative, index: 0.16.

## 2020-07-12 NOTE — Telephone Encounter (Signed)
Pt's father, Henrry Feil called, son ask me to call you.He would like a call from the nurse. Father stated, Saturday Pt started having more falls then normal. Contact info: [phone]

## 2020-07-12 NOTE — Telephone Encounter (Signed)
[facility] [employer] pending. Faxed notes. GI is not in network with the patient plan. It is pending for [facility].

## 2020-07-12 NOTE — Telephone Encounter (Signed)
Called and spoke with pt. Relayed Dr. Bonnita Hollow recommendation. He is agreeable to this plan. He would like to go to [facility] for these tests. Advised once tests authorized via insurance, he will be called to get these scheduled. Per Dr. Epimenio Foot, order w/wo contrast for both. I placed orders.

## 2020-07-12 NOTE — Telephone Encounter (Signed)
Lets check MRI of the brain and cervical spine to determine if there are any new lesions.

## 2020-07-12 NOTE — Telephone Encounter (Signed)
Called pt back to provide him phone # to [facility] to get Tysabri infusion set up. He states he already called and set up next infusion for 07/15/20 at 8am.

## 2020-07-14 NOTE — Telephone Encounter (Signed)
Medicaid [employer] stating they need more additional info I faxed the phone note from 07/12/20.

## 2020-07-15 ENCOUNTER — Ambulatory Visit (HOSPITAL_COMMUNITY)
Admission: RE | Admit: 2020-07-15 | Discharge: 2020-07-15 | Disposition: A | Discharge status: Home / Self Care | Payer: Medicaid Other | Source: Ambulatory Visit | Attending: Neurology | Admitting: Neurology

## 2020-07-15 ENCOUNTER — Telehealth (HOSPITAL_COMMUNITY): Payer: Self-pay | Admitting: Physician Assistant

## 2020-07-15 ENCOUNTER — Other Ambulatory Visit: Payer: Self-pay

## 2020-07-15 DIAGNOSIS — G35 Multiple sclerosis: Secondary | ICD-10-CM | POA: Insufficient documentation

## 2020-07-15 MED ORDER — SODIUM CHLORIDE 0.9 % IV SOLN
INTRAVENOUS | Status: DC | PRN
  Administered 2020-07-15: 250 mL via INTRAVENOUS

## 2020-07-15 MED ORDER — SODIUM CHLORIDE 0.9 % IV SOLN
300.0000 mg | INTRAVENOUS | Status: DC
  Administered 2020-07-15: 300 mg via INTRAVENOUS
  Filled 2020-07-15: qty 15

## 2020-07-15 NOTE — Progress Notes (Signed)
CENTER NOTE   Diagnosis: Multiple Sclerosis       Provider: Despina Arias, MD     Procedure: Donnamarie Poag IV     Note: Patient received Tysabri infusion via PIV. Tolerated infusion well with no adverse reaction. Vital signs stable. Discharge instructions given. Patient declined to stay for the 1 hour post-infusion observation. Alert, oriented and ambulatory at discharge.

## 2020-07-15 NOTE — Discharge Instructions (Signed)
Natalizumab injection What is this medicine? NATALIZUMAB (na ta LIZ you mab) is used to treat relapsing multiple sclerosis. This drug is not a cure. It is also used to treat Crohn's disease. This medicine may be used for other purposes; ask your health care provider or pharmacist if you have questions. COMMON BRAND NAME(S): Tysabri What should I tell my health care provider before I take this medicine? They need to know if you have any of these conditions:  immune system problems  progressive multifocal leukoencephalopathy (PML)  an unusual or allergic reaction to natalizumab, other medicines, foods, dyes, or preservatives  pregnant or trying to get pregnant  breast-feeding How should I use this medicine? This medicine is for infusion into a vein. It is given by a health care professional in a hospital or clinic setting. A special MedGuide will be given to you by the pharmacist with each prescription and refill. Be sure to read this information carefully each time. Talk to your pediatrician regarding the use of this medicine in children. This medicine is not approved for use in children. Overdosage: If you think you have taken too much of this medicine contact a poison control center or emergency room at once. NOTE: This medicine is only for you. Do not share this medicine with others. What if I miss a dose? It is important not to miss your dose. Call your doctor or health care professional if you are unable to keep an appointment. What may interact with this medicine? Do not take this medicine with any of the following medications:  biologic medicines such as adalimumab, certolizumab, etanercept, golimumab, infliximab This medicine may also interact with the following medications:  azathioprine  cyclosporine  interferons  6-mercaptopurine  methotrexate  other medicines that lower your chance of fighting an infection  steroid medicines like prednisone or  cortisone  vaccines This list may not describe all possible interactions. Give your health care provider a list of all the medicines, herbs, non-prescription drugs, or dietary supplements you use. Also tell them if you smoke, drink alcohol, or use illegal drugs. Some items may interact with your medicine. What should I watch for while using this medicine? Your condition will be monitored carefully while you are receiving this medicine. Visit your doctor for regular check ups. Tell your doctor or healthcare professional if your symptoms do not start to get better or if they get worse. Stay away from people who are sick. Call your doctor or health care professional for advice if you get a fever, chills or sore throat, or other symptoms of a cold or flu. Do not treat yourself. In some patients, this medicine may cause a serious brain infection that may cause death. If you have any problems seeing, thinking, speaking, walking, or standing, tell your doctor right away. If you cannot reach your doctor, get urgent medical care. What side effects may I notice from receiving this medicine? Side effects that you should report to your doctor or health care professional as soon as possible:  allergic reactions like skin rash, itching or hives, swelling of the face, lips, or tongue  breathing problems  changes in vision  chest pain  confusion  depressed mood  dizziness  feeling faint; lightheaded; falls  general ill feeling or flu-like symptoms  loss of memory  missed menstrual periods  muscle weakness  problems with balance, talking, or walking  signs and symptoms of liver injury like dark yellow or brown urine; general ill feeling or flu-like symptoms; light-colored   stools; loss of appetite; nausea; right upper belly pain; unusually weak or tired; yellowing of the eyes or skin  suicidal thoughts, mood changes  unusual bruising or bleeding  unusually weak or tired Side effects that  usually do not require medical attention (report to your doctor or health care professional if they continue or are bothersome):  headache  joint pain  muscle cramps  muscle pain  nausea, vomiting  pain, redness, or irritation at site where injected  tiredness This list may not describe all possible side effects. Call your doctor for medical advice about side effects. You may report side effects to FDA at 1-800-FDA-1088. Where should I keep my medicine? This drug is given in a hospital or clinic and will not be stored at home. NOTE: This sheet is a summary. It may not cover all possible information. If you have questions about this medicine, talk to your doctor, pharmacist, or health care provider.  2021 Elsevier/Gold Standard (2018-12-09 13:20:26)  

## 2020-07-19 NOTE — Telephone Encounter (Signed)
Medicaid [employer] auth: [id number] & [id number] (exp. 07/12/20 to 08/11/20).  I spoke with the patient and informed him that GI is not in network with his insurance plan and that [facility] is. I informed him I will cancel his GI appt and [facility] will reach out to him to schedule his appointment.

## 2020-07-19 NOTE — Telephone Encounter (Signed)
scheduled at [facility] 2/10 12PM

## 2020-07-21 ENCOUNTER — Encounter (HOSPITAL_COMMUNITY): Payer: Medicaid Other | Admitting: Physician Assistant

## 2020-07-22 ENCOUNTER — Other Ambulatory Visit (HOSPITAL_COMMUNITY): Payer: Self-pay | Admitting: Physician Assistant

## 2020-07-22 ENCOUNTER — Telehealth (HOSPITAL_COMMUNITY): Payer: Self-pay | Admitting: *Deleted

## 2020-07-22 DIAGNOSIS — F319 Bipolar disorder, unspecified: Secondary | ICD-10-CM

## 2020-07-22 DIAGNOSIS — G4709 Other insomnia: Secondary | ICD-10-CM

## 2020-07-22 DIAGNOSIS — F411 Generalized anxiety disorder: Secondary | ICD-10-CM

## 2020-07-22 MED ORDER — QUETIAPINE FUMARATE 100 MG PO TABS: 100.0000 mg | ORAL_TABLET | Freq: Every day | ORAL | 1 refills | Status: DC

## 2020-07-22 MED ORDER — HYDROXYZINE HCL 25 MG PO TABS: 25.0000 mg | ORAL_TABLET | Freq: Three times a day (TID) | ORAL | 0 refills | Status: DC | PRN

## 2020-07-22 NOTE — Telephone Encounter (Signed)
Rx Refill Request   hydrOXYzine (ATARAX/VISTARIL) 25 MG tablet 90 tablet 1 06/09/2020

## 2020-07-22 NOTE — Telephone Encounter (Signed)
Provider was contacted by Direce E, RMA regarding refill request for patient. Patient's medication will be e-prescribed to pharmacy of choice. 

## 2020-07-22 NOTE — Progress Notes (Signed)
Provider was contacted by Direce E, RMA regarding refill request for patient. Patient's medication will be e-prescribed to pharmacy of choice.

## 2020-07-22 NOTE — Telephone Encounter (Signed)
Provider was contacted by Earnestine Mealing regarding patient's medication. It is my understanding that patient is requesting dosage change/medication refill on his Seroquel. Provider was informed that patient has been taking two (2) 50 mg Seroquel tablets to help with his sleep. Patient reports that this change in routine has been helpful for the management of his sleep.  Provider will increase dosage of patient's Seroquel from 50 mg to 100 mg. Patient's medication will be e-prescribed to pharmacy of choice.

## 2020-07-22 NOTE — Progress Notes (Signed)
Provider was contacted by Earnestine Mealing regarding patient's medication. It is my understanding that patient is requesting a dosage change/medication refill on his Seroquel. Provider was informed that patient has been taking two (2) 50 mg Seroquel tablets to help with his sleep. Patient reports that this change in routine has been helpful for the management of his sleep.  Provider will increase dosage of patient's Seroquel from 50 mg to 100 mg. Patient's medication will be e-prescribed to pharmacy of choice.

## 2020-07-24 ENCOUNTER — Other Ambulatory Visit: Payer: Medicaid Other

## 2020-07-26 ENCOUNTER — Telehealth (INDEPENDENT_AMBULATORY_CARE_PROVIDER_SITE_OTHER): Payer: Medicaid Other | Admitting: Nurse Practitioner

## 2020-07-26 ENCOUNTER — Encounter: Payer: Self-pay | Admitting: Nurse Practitioner

## 2020-07-26 VITALS — Ht 71.0 in | Wt 200.0 lb

## 2020-07-26 DIAGNOSIS — G35 Multiple sclerosis: Secondary | ICD-10-CM

## 2020-07-26 DIAGNOSIS — R296 Repeated falls: Secondary | ICD-10-CM | POA: Diagnosis not present

## 2020-07-26 DIAGNOSIS — G8929 Other chronic pain: Secondary | ICD-10-CM

## 2020-07-26 DIAGNOSIS — M25522 Pain in left elbow: Secondary | ICD-10-CM

## 2020-07-26 DIAGNOSIS — M5441 Lumbago with sciatica, right side: Secondary | ICD-10-CM

## 2020-07-26 DIAGNOSIS — M25521 Pain in right elbow: Secondary | ICD-10-CM

## 2020-07-26 NOTE — Progress Notes (Signed)
   [facility] [address] [place], [place]  [address] Phone:  [phone]   Fax:  [phone] Virtual Visit via Telephone Note  I connected with Bradley Brandt on 07/26/20 at  8:20 AM EST by telephone and verified that I am speaking with the correct person using two identifiers.   I discussed the limitations, risks, security and privacy concerns of performing an evaluation and management service by telephone and the availability of in person appointments. I also discussed with the patient that there may be a patient responsible charge related to this service. The patient expressed understanding and agreed to proceed.  Patient home Provider Office  History of Present Illness:  Back Pain Patient presents for evaluation of low back problems. Symptoms have been present for 2 weeks and include numbness in right leg..This is getting worse.  Initial inciting event: frequent falls. Symptoms are worse all day. Alleviating factors identifiable by the patient are none. Aggravating factors identifiable by the patient are all activity. Treatments initiated by the patient: Gabapentin and baclofen not effective. He admits that he was in pain management in the past.   Review of Systems  Musculoskeletal: Positive for back pain.       Going down the right leg, the whole leg  Numbness and tingling Pain has gotten worse He continues to fall Last fall was yesterday No therapy Left elbow continues to hurt with L>R xrays were normal    Observations/Objective: Virtual visit no exam  Assessment and Plan: Assessment  Primary Diagnosis & Pertinent Problem List: The primary encounter diagnosis was Chronic bilateral low back pain with right-sided sciatica. Diagnoses of Pain of both elbows and Frequent falls were also pertinent to this visit.  Visit Diagnosis: 1. Chronic bilateral low back pain with right-sided sciatica  Declined steroid Dosepak Requesting referral to pain  management Physical therapy referral placed due to frequent falls  2. Pain of both elbows   3. Frequent falls     Follow Up Instructions: Follow-up in 6 months or sooner if needed   I discussed the assessment and treatment plan with the patient. The patient was provided an opportunity to ask questions and all were answered. The patient agreed with the plan and demonstrated an understanding of the instructions.   The patient was advised to call back or seek an in-person evaluation if the symptoms worsen or if the condition fails to improve as anticipated.  I provided 10 minutes of video- face-to-face time during this encounter.   Barbette Merino, NP

## 2020-07-26 NOTE — Patient Instructions (Signed)
Lumbosacral Radiculopathy Lumbosacral radiculopathy is a condition that involves the spinal nerves and nerve roots in the low back and bottom of the spine. The condition develops when these nerves and nerve roots move out of place or become inflamed and cause symptoms. What are the causes? This condition may be caused by:  Pressure from a disk that bulges out of place (herniated disk). A disk is a plate of soft cartilage that separates bones in the spine.  Disk changes that occur with age (disk degeneration).  A narrowing of the bones of the lower back (spinal stenosis).  A tumor.  An infection.  An injury that places sudden pressure on the disks that cushion the bones of your lower spine. What increases the risk? You are more likely to develop this condition if:  You are a male who is 30-50 years old.  You are a male who is 50-60 years old.  You use improper technique when lifting things.  You are overweight or live a sedentary lifestyle.  You smoke.  Your work requires frequent lifting.  You do repetitive activities that strain the spine. What are the signs or symptoms? Symptoms of this condition include:  Pain that goes down from your back into your legs (sciatica), usually on one side of the body. This is the most common symptom. The pain may be worse with sitting, coughing, or sneezing.  Pain and numbness in your legs.  Muscle weakness.  Tingling.  Loss of bladder control or bowel control.   How is this diagnosed? This condition may be diagnosed based on:  Your symptoms and medical history.  A physical exam. If the pain is lasting, you may have tests, such as:  MRI scan.  X-ray.  CT scan.  A type of X-ray used to examine the spinal canal after injecting a dye into your spine (myelogram).  A test to measure how electrical impulses move through a nerve (nerve conduction study). How is this treated? Treatment may depend on the cause of the condition  and may include:  Working with a physical therapist.  Taking pain medicine.  Applying heat and ice to affected areas.  Doing stretches to improve flexibility.  Doing exercises to strengthen back muscles.  Having chiropractic spinal manipulation.  Using transcutaneous electrical nerve stimulation (TENS) therapy.  Getting a steroid injection in the spine. In some cases, no treatment is needed. If the condition is long-lasting (chronic), or if symptoms are severe, treatment may involve surgery or lifestyle changes, such as following a weight-loss plan. Follow these instructions at home: Activity  Avoid bending and other activities that make the problem worse.  Maintain a proper position when standing or sitting: ? When standing, keep your upper back and neck straight, with your shoulders pulled back. Avoid slouching. ? When sitting, keep your back straight and relax your shoulders. Do not round your shoulders or pull them backward.  Do not sit or stand in one place for long periods of time.  Take brief periods of rest throughout the day. This will reduce your pain. It is usually better to rest by lying down or standing, not sitting.  When you are resting for longer periods, mix in some mild activity or stretching between periods of rest. This will help to prevent stiffness and pain.  Get regular exercise. Ask your health care provider what activities are safe for you. If you were shown how to do any exercises or stretches, do them as directed by your health care provider.    Do not lift anything that is heavier than 10 lb (4.5 kg) or the limit that you are told by your health care provider. Always use proper lifting technique, which includes: ? Bending your knees. ? Keeping the load close to your body. ? Avoiding twisting. Managing pain  If directed, put ice on the affected area: ? Put ice in a plastic bag. ? Place a towel between your skin and the bag. ? Leave the ice on for  20 minutes, 2-3 times a day.  If directed, apply heat to the affected area as often as told by your health care provider. Use the heat source that your health care provider recommends, such as a moist heat pack or a heating pad. ? Place a towel between your skin and the heat source. ? Leave the heat on for 20-30 minutes. ? Remove the heat if your skin turns bright red. This is especially important if you are unable to feel pain, heat, or cold. You may have a greater risk of getting burned.  Take over-the-counter and prescription medicines only as told by your health care provider. General instructions  Sleep on a firm mattress in a comfortable position. Try lying on your side with your knees slightly bent. If you lie on your back, put a pillow under your knees.  Do not drive or use heavy machinery while taking prescription pain medicine.  If your health care provider prescribed a diet or exercise program, follow it as directed.  Keep all follow-up visits as told by your health care provider. This is important. Contact a health care provider if:  Your pain does not improve over time, even when taking pain medicines. Get help right away if:  You develop severe pain.  Your pain suddenly gets worse.  You develop increasing weakness in your legs.  You lose the ability to control your bladder or bowel.  You have difficulty walking or balancing.  You have a fever. Summary  Lumbosacral radiculopathy is a condition that occurs when the spinal nerves and nerve roots in the lower part of the spine move out of place or become inflamed and cause symptoms.  Symptoms include pain, numbness, and tingling that go down from your back into your legs (sciatica), muscle weakness, and loss of bladder control or bowel control.  If directed, apply ice or heat to the affected area as told by your health care provider.  Follow instructions about activity, rest, and proper lifting technique. This  information is not intended to replace advice given to you by your health care provider. Make sure you discuss any questions you have with your health care provider. Document Revised: 04/15/2020 Document Reviewed: 04/15/2020 Elsevier Patient Education  2021 Elsevier Inc.  

## 2020-08-04 ENCOUNTER — Telehealth: Payer: Self-pay | Admitting: *Deleted

## 2020-08-04 NOTE — Telephone Encounter (Signed)
Received fax notification from touch prescribing program that pt re-authorized from 08/04/20-02/20/21 for Tysabri. Pt enrollment number: [id number]. Account: [facility]. Site auth number: [id number].

## 2020-08-05 ENCOUNTER — Ambulatory Visit (INDEPENDENT_AMBULATORY_CARE_PROVIDER_SITE_OTHER): Payer: Medicaid Other | Admitting: Licensed Clinical Social Worker

## 2020-08-05 ENCOUNTER — Other Ambulatory Visit: Payer: Self-pay

## 2020-08-05 DIAGNOSIS — F411 Generalized anxiety disorder: Secondary | ICD-10-CM | POA: Diagnosis not present

## 2020-08-06 ENCOUNTER — Telehealth: Payer: Self-pay | Admitting: Neurology

## 2020-08-06 NOTE — Telephone Encounter (Signed)
I looked at the MRIs of the brain and cervical spine and compared to previous ones.  There are no new lesions.

## 2020-08-09 NOTE — Progress Notes (Signed)
Comprehensive Clinical Assessment (CCA) Note  08/09/2020 Bradley Brandt [id number]  Chief Complaint:  Chief Complaint  Patient presents with  . Anxiety   Visit Diagnosis: GAD   CCA Biopsychosocial Intake/Chief Complaint:  "My thinking is not right". Pt states "I'm 40 and I act like a child".  Current Symptoms/Problems: Anxious- restless, poor concentration, racing thoughts, more anx in closed up spaces. States "I lie a lot".  Patient Reported Schizophrenia/Schizoaffective Diagnosis in Past: No  Strengths: Seeking help  Preferences: Call him Kathlene November, in person sessions  Type of Services Patient Feels are Needed: Counseling, med management  Initial Clinical Notes/Concerns: LCSW reviewed informed consent for couseling with pt's full acknowledgement. Pt has such a strong odor of cannabis clinician can smell him when going out to get him from the lobby and onoging during session. Office continued to smell of cannabis after he left and deotorizer used. Pt denied being under the influence when asked. Honesty addressed with pt if there is be any progress in therapeutic relationship. Pt verbalizes understanding. Pt noted to have an usteady and slow gait. He reports he has MS, dx 2006. Pt states he is living alone and can care for self. Father currently has his 16 yr old child but pt reports he just kept child for a month. Pt reports being incarcerated from 1997-2010 for attempted murder and armed robbery. He denies current probation. Pt states he is taking meds as prescribed. Reports he used to be "a cutter" years ago. States this is not a current problem. Pt states he wants "to grow up" and "be more independent".   Mental Health Symptoms Depression:  Worthlessness   Duration of Depressive symptoms: Greater than two weeks   Mania:  None   Anxiety:   Difficulty concentrating; Restlessness; Tension; Worrying   Psychosis:  None   Duration of Psychotic symptoms: No data recorded  Trauma:  --  (Needs additional assessment)   Obsessions:  None   Compulsions:  -- (Compulsive liar per pt)   Inattention:  -- (Needs additional assessment)   Hyperactivity/Impulsivity:  Feeling of restlessness   Oppositional/Defiant Behaviors:  No data recorded  Emotional Irregularity:  Mood lability   Other Mood/Personality Symptoms:  No data recorded   Mental Status Exam Appearance and self-care  Stature:  Average   Weight:  Average weight   Clothing:  Disheveled   Grooming:  Neglected   Cosmetic use:  None   Posture/gait:  Slumped   Motor activity:  Not Remarkable   Sensorium  Attention:  Distractible   Concentration:  Scattered   Orientation:  X5   Recall/memory:  -- (Needs additional assessment)   Affect and Mood  Affect:  Blunted; Other (Comment) (Pt denied being under influence of cannabis but the odor of cannabis was overwhelming.)   Mood:  Dysphoric; Negative   Relating  Eye contact:  Avoided   Facial expression:  Tense   Attitude toward examiner:  -- (Somewhat cooperative)   Thought and Language  Speech flow: Slow   Thought content:  Appropriate to Mood and Circumstances   Preoccupation:  Other (Comment) (Needs additional assessment)   Hallucinations:  None   Organization:  No data recorded, needs additional assessment  [employer] of Knowledge:  Fair   Intelligence:  Needs investigation   Abstraction:  Normal   Judgement:  -- (Needs additional assessment)   Reality Testing:  -- (Needs additional assessment)   Insight:  Other (Comment) (Needs additional assessment)   Decision Making:  -- (  Needs additional assessment)   Social Functioning  Social Maturity:  -- (Needs additional assessment)   Social Judgement:  "Chief of Staff"   Stress  Stressors:  Surveyor, quantity; Illness   Coping Ability:  Contractor Deficits:  Self-care   Supports:  Family     Religion:   Leisure/Recreation: Leisure / Recreation Do You Have  Hobbies?: Yes Leisure and Hobbies: "I like sex, that's about it"  Exercise/Diet: Exercise/Diet Do You Exercise?: Yes What Type of Exercise Do You Do?: Other (Comment) (Reports using a [employer] machine daily) How Many Times a Week Do You Exercise?: 6-7 times a week Do You Have Any Trouble Sleeping?: No (with meds)  CCA Employment/Education Employment/Work Situation: Employment / Work Situation Employment situation: On disability Why is patient on disability: physical and mental health How long has patient been on disability: 2010 Has patient ever been in the [employer]?: No  Education: Education Is Patient Currently Attending School?: No Last Grade Completed: 11 (Got GED)  CCA Family/Childhood History Family and Relationship History: Family history Marital status: Single (Has a current girl friend) Are you sexually active?: Yes What is your sexual orientation?: "Straight" Does patient have children?: Yes How many children?: 1 (son, 4 yrs old) How is patient's relationship with their children?: "Good"  Childhood History:  Childhood History By whom was/is the patient raised?: Both parents Additional childhood history information: Adopted as an infant Description of patient's relationship with caregiver when they were a child: "Hated my dad",  "good mom" Patient's description of current relationship with people who raised him/her: "good with dad", mom "good" Does patient have siblings?: Yes Number of Siblings: 1 (sister) Description of patient's current relationship with siblings: "good" Did patient suffer any verbal/emotional/physical/sexual abuse as a child?: No Did patient suffer from severe childhood neglect?: No Has patient ever been sexually abused/assaulted/raped as an adolescent or adult?: No Witnessed domestic violence?: No Has patient been affected by domestic violence as an adult?: No   CCA Substance Use Alcohol/Drug Use: Alcohol / Drug Use History of alcohol  / drug use?: Yes (Uses cannabis daily, reports no other drug use, minimal alcohol. No desire to change.)   DSM5 Diagnoses: Patient Active Problem List   Diagnosis Date Noted  . 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HCC) 12/31/2017  . Optic neuritis 08/02/2017  . High risk medication use 08/02/2017  . Numbness 12/16/2015  . Spastic gait 12/16/2015  . Urinary hesitancy 12/16/2015  . Low back pain with sciatica 09/28/2014  . Polysubstance dependence (HCC) 02/09/2014  . 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 Relapsing remitting multiple sclerosis (HCC) 05/28/2013  . Elevated BP 01/06/2013  . Bipolar disorder, unspecified (HCC) 01/03/2013  . Bipolar affective disorder (HCC) 01/03/2013  . Right hemiparesis (HCC) 01/02/2013  . Multiple sclerosis flare 01/02/2013  . Hemiplegia (HCC) 01/02/2013    Patient Centered Plan: Patient is on the following Treatment Plan(s):  Anxiety   [place], LCSW

## 2020-08-11 ENCOUNTER — Encounter (HOSPITAL_COMMUNITY): Payer: Self-pay | Admitting: Physician Assistant

## 2020-08-11 ENCOUNTER — Other Ambulatory Visit: Payer: Self-pay

## 2020-08-11 ENCOUNTER — Ambulatory Visit (INDEPENDENT_AMBULATORY_CARE_PROVIDER_SITE_OTHER): Payer: Medicaid Other | Admitting: Physician Assistant

## 2020-08-11 DIAGNOSIS — G4709 Other insomnia: Secondary | ICD-10-CM

## 2020-08-11 DIAGNOSIS — F319 Bipolar disorder, unspecified: Secondary | ICD-10-CM

## 2020-08-11 DIAGNOSIS — F411 Generalized anxiety disorder: Secondary | ICD-10-CM

## 2020-08-11 MED ORDER — ESCITALOPRAM OXALATE 10 MG PO TABS: 10.0000 mg | ORAL_TABLET | Freq: Every day | ORAL | 1 refills | Status: DC

## 2020-08-11 MED ORDER — QUETIAPINE FUMARATE 100 MG PO TABS: 150.0000 mg | ORAL_TABLET | Freq: Every day | ORAL | 1 refills | Status: DC

## 2020-08-11 MED ORDER — HYDROXYZINE HCL 50 MG PO TABS: 50.0000 mg | ORAL_TABLET | Freq: Three times a day (TID) | ORAL | 1 refills | Status: DC | PRN

## 2020-08-11 NOTE — Progress Notes (Signed)
BH MD/PA/NP OP Progress Note  08/11/2020 12:14 PM Bradley Brandt  MRN:  [id number]  Chief Complaint: Follow up and medication management  HPI:   Bradley Brandt is a 40 year old male with a past psychiatric history significant for bipolar disorder who presents to [facility] for follow-up and medication management.  Prior to this encounter, patient has been managed on the following medications:  Seroquel 100 mg at bedtime Hydroxyzine 25 mg 3 times daily as needed  Patient reports that his Seroquel has not been helpful in the management of his sleep.  Provider informed patient that a message that the patient sent a few weeks back details that Seroquel 100 mg at bedtime was helpful in the management of his sleep.  Patient reports that he has been experiencing multiple instances of waking up from his sleep.  Patient states that each time he would wake up he would take another Seroquel pill.  Provider advised patient that he should only be taking his Seroquel at night.  Patient reports that he is also been taking his Seroquel during the day when feeling anxious in an attempt to fall asleep.  Patient denies changes in medication regimen or use of illicit substances/stimulants.  Patient rates his anxiety a 6 out of 10.  Patient's main stressor is his girlfriend who suffers from anxiety as well.  Patient reports that his girlfriend is currently seeing a therapist  that tells her that she should leave the patient.  Patient is confident that his girlfriend will not leave him but states that with both of them dealing with anxiety it makes it hard to interact with one another.  For example, patient's girlfriend gets annoyed when patient is unable to fall asleep. When patient is unable to sleep, he states that he must find something to do to occupy himself or he will just toss and turn which in turn aggravates his girlfriend.  Patient is calm, cooperative, and fully  engaged in conversation during the encounter.  Patient reports that he still feels anxious.  Patient denies suicidal or homicidal ideations.  He further denies auditory or visual hallucinations.  Patient endorses fair sleep and receives on average 4 to 5 hours of intermittent sleep.  Patient endorses increased appetite and states that he has roughly 4-5 meals per day.  Patient expresses that he can eat all day without gaining any weight.  Patient denies alcohol consumption.  He endorses tobacco use and smokes roughly 1/2 a pack a day.  Patient endorses illicit drug use in the form of marijuana.   Visit Diagnosis:    ICD-10-CM   1. Bipolar affective disorder, remission status unspecified (HCC)  F31.9 QUEtiapine (SEROQUEL) 100 MG tablet    escitalopram (LEXAPRO) 10 MG tablet  2. Other insomnia  G47.09 QUEtiapine (SEROQUEL) 100 MG tablet  3. Generalized anxiety disorder  F41.1 hydrOXYzine (ATARAX/VISTARIL) 50 MG tablet    escitalopram (LEXAPRO) 10 MG tablet    Past Psychiatric History:  Bipolar disorder Generalized anxiety disorder  Past Medical History:  Past Medical History:  Diagnosis Date  . Bipolar 1 disorder (HCC)   . Bipolar disorder, unspecified (HCC) 01/03/2013  . MS (multiple sclerosis) (HCC) dx 2006   relapsing-remitting right sided weakness  . Neuromuscular disorder [facility])    MS    Past Surgical History:  Procedure Laterality Date  . TUMOR REMOVAL  1983   abdomen benign    Family Psychiatric History: No known family history of psychiatric illness  Family  History:  Family History  Adopted: Yes  Problem Relation Age of Onset  . Lupus Mother   . Ataxia Neg Hx   . Chorea Neg Hx   . Dementia Neg Hx   . Mental retardation Neg Hx   . Migraines Neg Hx   . Multiple sclerosis Neg Hx   . Neurofibromatosis Neg Hx   . Neuropathy Neg Hx   . Parkinsonism Neg Hx   . Seizures Neg Hx   . Stroke Neg Hx     Social History:  Social History   Socioeconomic History  . Marital  status: Single    Spouse name: Not on file  . Number of children: Not on file  . Years of education: Not on file  . Highest education level: Not on file  Occupational History  . Not on file  Tobacco Use  . Smoking status: Former Smoker    Packs/day: 0.00  . Smokeless tobacco: Never Used  Vaping Use  . Vaping Use: Former  Substance and Sexual Activity  . Alcohol use: Yes    Comment: once a month  . Drug use: Yes    Frequency: 7.0 times per week    Types: Marijuana  . Sexual activity: Yes    Partners: Female  Other Topics Concern  . Not on file  Social History Narrative  . Not on file   Social Determinants of Health   Financial Resource Strain: Not on file  Food Insecurity: Not on file  Transportation Needs: Not on file  Physical Activity: Not on file  Stress: Not on file  Social Connections: Not on file    Allergies:  Allergies  Allergen Reactions  . Lithium Rash    Metabolic Disorder Labs: No results found for: HGBA1C, MPG No results found for: PROLACTIN Lab Results  Component Value Date   CHOL 194 04/08/2020   TRIG 110 04/08/2020   HDL 38 (L) 04/08/2020   CHOLHDL 5.1 (H) 04/08/2020   LDLCALC 136 (H) 04/08/2020   No results found for: TSH  Therapeutic Level Labs: No results found for: LITHIUM No results found for: VALPROATE No components found for:  CBMZ  Current Medications: Current Outpatient Medications  Medication Sig Dispense Refill  . escitalopram (LEXAPRO) 10 MG tablet Take 1 tablet (10 mg total) by mouth daily. 30 tablet 1  . acetaminophen (TYLENOL) 500 MG tablet Take 500 mg by mouth 2 (two) times daily.    . baclofen (LIORESAL) 20 MG tablet Take 1 tablet (20 mg total) by mouth 3 (three) times daily. 90 tablet 11  . dalfampridine 10 MG TB12 Take 1 tablet by mouth every 12 hours. 60 tablet 11  . gabapentin (NEURONTIN) 600 MG tablet Take 1 tablet (600 mg total) by mouth 3 (three) times daily. 90 tablet 11  . hydrOXYzine (ATARAX/VISTARIL) 50 MG  tablet Take 1 tablet (50 mg total) by mouth 3 (three) times daily as needed. 90 tablet 1  . natalizumab (TYSABRI) 300 MG/15ML injection Inject 15 mLs (300 mg total) into the vein every 28 (twenty-eight) days. 15 mL 5  . QUEtiapine (SEROQUEL) 100 MG tablet Take 1.5 tablets (150 mg total) by mouth at bedtime. 30 tablet 1  . tamsulosin (FLOMAX) 0.4 MG CAPS capsule Take 2 capsules (0.8 mg total) by mouth daily. 60 capsule 11  . Vitamin D, Ergocalciferol, (DRISDOL) 1.25 MG (50000 UNIT) CAPS capsule Take 1 capsule (50,000 Units total) by mouth every 7 (seven) days. 13 capsule 3   No current facility-administered medications for  this visit.     Musculoskeletal: Strength & Muscle Tone: within normal limits Gait & Station: unsteady, ataxic, patient was using a cane to assisst him in walking Patient leans: N/A   Psychiatric Specialty Exam: Review of Systems  Psychiatric/Behavioral: Positive for dysphoric mood and sleep disturbance. Negative for decreased concentration, hallucinations, self-injury and suicidal ideas. The patient is nervous/anxious. The patient is not hyperactive.     Blood pressure 118/68, pulse 77, temperature 98.3 F (36.8 C), temperature source Oral, height 5\' 11"  (1.803 m), weight 193 lb (87.5 kg), SpO2 99 %.Body mass index is 26.92 kg/m.  General Appearance: Fairly Groomed and Neat  Eye Contact:  Good  Speech:  Clear and Coherent and Normal Rate  Volume:  Normal  Mood:  Anxious, Dysphoric and Irritable  Affect:  Congruent and Depressed  Thought Process:  Coherent, Goal Directed and Descriptions of Associations: Intact  Orientation:  Full (Time, Place, and Person)  Thought Content: WDL and Logical   Suicidal Thoughts:  No  Homicidal Thoughts:  No  Memory:  Immediate;   Good Recent;   Good Remote;   Good  Judgement:  Fair  Insight:  Fair  Psychomotor Activity:  Restlessness  Concentration:  Concentration: Good and Attention Span: Good  Recall:  Good  Fund of  Knowledge: Fair  Language: Good  Akathisia:  NA  Handed:  Right  AIMS (if indicated): not done  Assets:  Communication Skills Desire for Improvement Housing Social Support  ADL's:  Intact  Cognition: WNL  Sleep:  Poor   Screenings: GAD-7   Office Visit from 08/11/2020 in [facility] Office Visit from 06/09/2020 in [facility]  Total GAD-7 Score 21 19    PHQ2-9   Flowsheet Row Office Visit from 08/11/2020 in [facility] Counselor from 08/05/2020 in [facility] Office Visit from 06/09/2020 in [facility]  PHQ-2 Total Score 3 6 5   PHQ-9 Total Score 17 17 25     Flowsheet Row Office Visit from 08/11/2020 in [facility] Counselor from 08/05/2020 in [facility]  C-SSRS RISK CATEGORY Low Risk No Risk       Assessment and Plan:  Bradley Brandt is a 40 year old male with a past psychiatric history significant for bipolar disorder who presents to [facility] for follow-up and medication management.  Patient reports that he has been taking multiple pills of his Seroquel in an attempt to manage his sleep.  Patient reports that he will often take Seroquel when experiencing anxiety in the morning.  Patient was advised to only take his Seroquel at night for the management of his insomnia.  A PHQ 9 screen was performed with the patient scoring a 17.  A GAD-7 screen was also performed with the patient scoring a 21 indicative of severe anxiety.  Based on his PHQ 9 and GAD 7 score, patient was recommended escitalopram 10 mg daily for the management of his low mood and severe anxiety.  Patient's hydroxyzine was also increased from 25 mg to 50 mg 3 times daily as needed for the management of anxiety.  Patient is agreeable to plan.    Patient was  recommended Seroquel 150 mg only at bedtime for the management of his mood/insomnia.  Patient was agreeable to recommendation.  Patient's medications will be e-prescribed to pharmacy of choice.  1. Bipolar affective disorder, remission status unspecified (  HCC)  - QUEtiapine (SEROQUEL) 100 MG tablet; Take 1.5 tablets (150 mg total) by mouth at bedtime.  Dispense: 30 tablet; Refill: 1 - escitalopram (LEXAPRO) 10 MG tablet; Take 1 tablet (10 mg total) by mouth daily.  Dispense: 30 tablet; Refill: 1  2. Other insomnia  - QUEtiapine (SEROQUEL) 100 MG tablet; Take 1.5 tablets (150 mg total) by mouth at bedtime.  Dispense: 30 tablet; Refill: 1  3. Generalized anxiety disorder  - hydrOXYzine (ATARAX/VISTARIL) 50 MG tablet; Take 1 tablet (50 mg total) by mouth 3 (three) times daily as needed.  Dispense: 90 tablet; Refill: 1 - escitalopram (LEXAPRO) 10 MG tablet; Take 1 tablet (10 mg total) by mouth daily.  Dispense: 30 tablet; Refill: 1  Patient to follow up in 6 weeks  Meta Hatchet, PA 08/11/2020, 12:14 PM

## 2020-08-16 ENCOUNTER — Non-Acute Institutional Stay (HOSPITAL_COMMUNITY)
Admission: RE | Admit: 2020-08-16 | Discharge: 2020-08-16 | Disposition: A | Discharge status: Home / Self Care | Payer: Medicaid Other | Source: Ambulatory Visit | Attending: Internal Medicine | Admitting: Internal Medicine

## 2020-08-16 ENCOUNTER — Other Ambulatory Visit: Payer: Self-pay

## 2020-08-16 DIAGNOSIS — G35 Multiple sclerosis: Secondary | ICD-10-CM | POA: Insufficient documentation

## 2020-08-16 MED ORDER — SODIUM CHLORIDE 0.9 % IV SOLN
300.0000 mg | INTRAVENOUS | Status: DC
  Administered 2020-08-16: 300 mg via INTRAVENOUS
  Filled 2020-08-16: qty 15

## 2020-08-16 MED ORDER — SODIUM CHLORIDE 0.9 % IV SOLN
INTRAVENOUS | Status: DC | PRN
  Administered 2020-08-16: 250 mL via INTRAVENOUS

## 2020-08-16 NOTE — Discharge Instructions (Signed)
Natalizumab injection What is this medicine? NATALIZUMAB (na ta LIZ you mab) is used to treat relapsing multiple sclerosis. This drug is not a cure. It is also used to treat Crohn's disease. This medicine may be used for other purposes; ask your health care provider or pharmacist if you have questions. COMMON BRAND NAME(S): Tysabri What should I tell my health care provider before I take this medicine? They need to know if you have any of these conditions:  immune system problems  progressive multifocal leukoencephalopathy (PML)  an unusual or allergic reaction to natalizumab, other medicines, foods, dyes, or preservatives  pregnant or trying to get pregnant  breast-feeding How should I use this medicine? This medicine is for infusion into a vein. It is given by a health care professional in a hospital or clinic setting. A special MedGuide will be given to you by the pharmacist with each prescription and refill. Be sure to read this information carefully each time. Talk to your pediatrician regarding the use of this medicine in children. This medicine is not approved for use in children. Overdosage: If you think you have taken too much of this medicine contact a poison control center or emergency room at once. NOTE: This medicine is only for you. Do not share this medicine with others. What if I miss a dose? It is important not to miss your dose. Call your doctor or health care professional if you are unable to keep an appointment. What may interact with this medicine? Do not take this medicine with any of the following medications:  biologic medicines such as adalimumab, certolizumab, etanercept, golimumab, infliximab This medicine may also interact with the following medications:  azathioprine  cyclosporine  interferons  6-mercaptopurine  methotrexate  other medicines that lower your chance of fighting an infection  steroid medicines like prednisone or  cortisone  vaccines This list may not describe all possible interactions. Give your health care provider a list of all the medicines, herbs, non-prescription drugs, or dietary supplements you use. Also tell them if you smoke, drink alcohol, or use illegal drugs. Some items may interact with your medicine. What should I watch for while using this medicine? Your condition will be monitored carefully while you are receiving this medicine. Visit your doctor for regular check ups. Tell your doctor or healthcare professional if your symptoms do not start to get better or if they get worse. Stay away from people who are sick. Call your doctor or health care professional for advice if you get a fever, chills or sore throat, or other symptoms of a cold or flu. Do not treat yourself. In some patients, this medicine may cause a serious brain infection that may cause death. If you have any problems seeing, thinking, speaking, walking, or standing, tell your doctor right away. If you cannot reach your doctor, get urgent medical care. What side effects may I notice from receiving this medicine? Side effects that you should report to your doctor or health care professional as soon as possible:  allergic reactions like skin rash, itching or hives, swelling of the face, lips, or tongue  breathing problems  changes in vision  chest pain  confusion  depressed mood  dizziness  feeling faint; lightheaded; falls  general ill feeling or flu-like symptoms  loss of memory  missed menstrual periods  muscle weakness  problems with balance, talking, or walking  signs and symptoms of liver injury like dark yellow or brown urine; general ill feeling or flu-like symptoms; light-colored   stools; loss of appetite; nausea; right upper belly pain; unusually weak or tired; yellowing of the eyes or skin  suicidal thoughts, mood changes  unusual bruising or bleeding  unusually weak or tired Side effects that  usually do not require medical attention (report to your doctor or health care professional if they continue or are bothersome):  headache  joint pain  muscle cramps  muscle pain  nausea, vomiting  pain, redness, or irritation at site where injected  tiredness This list may not describe all possible side effects. Call your doctor for medical advice about side effects. You may report side effects to FDA at 1-800-FDA-1088. Where should I keep my medicine? This drug is given in a hospital or clinic and will not be stored at home. NOTE: This sheet is a summary. It may not cover all possible information. If you have questions about this medicine, talk to your doctor, pharmacist, or health care provider.  2021 Elsevier/Gold Standard (2018-12-09 13:20:26)  

## 2020-08-16 NOTE — Progress Notes (Signed)
PATIENT CARE CENTER NOTE  Diagnosis:Multiple Sclerosis   Provider:Sater, Gerlene Burdock, MD   Procedure:Tysabri IV   Note:Patient received Tysabri infusionvia PIV. Tolerated infusion well with no adverse reaction. Vital signs stable. Discharge instructions given. Patientdeclinedto stay for the1 hour post-infusionobservation. Alert, oriented and ambulatory at discharge.

## 2020-08-17 ENCOUNTER — Ambulatory Visit: Payer: Medicaid Other | Attending: Nurse Practitioner | Admitting: Physical Therapy

## 2020-08-17 ENCOUNTER — Encounter: Payer: Self-pay | Admitting: Physical Therapy

## 2020-08-17 DIAGNOSIS — G8929 Other chronic pain: Secondary | ICD-10-CM | POA: Insufficient documentation

## 2020-08-17 DIAGNOSIS — R262 Difficulty in walking, not elsewhere classified: Secondary | ICD-10-CM | POA: Insufficient documentation

## 2020-08-17 DIAGNOSIS — M6281 Muscle weakness (generalized): Secondary | ICD-10-CM | POA: Insufficient documentation

## 2020-08-17 DIAGNOSIS — M25521 Pain in right elbow: Secondary | ICD-10-CM | POA: Insufficient documentation

## 2020-08-17 DIAGNOSIS — M25522 Pain in left elbow: Secondary | ICD-10-CM | POA: Insufficient documentation

## 2020-08-17 DIAGNOSIS — G35 Multiple sclerosis: Secondary | ICD-10-CM | POA: Diagnosis present

## 2020-08-17 DIAGNOSIS — R600 Localized edema: Secondary | ICD-10-CM | POA: Diagnosis present

## 2020-08-17 DIAGNOSIS — M5441 Lumbago with sciatica, right side: Secondary | ICD-10-CM | POA: Insufficient documentation

## 2020-08-17 DIAGNOSIS — R296 Repeated falls: Secondary | ICD-10-CM | POA: Insufficient documentation

## 2020-08-17 NOTE — Therapy (Signed)
[facility] [address] [place], [place], [address] Phone: [phone]   Fax:  [phone]  Physical Therapy Evaluation  Patient Details  Name: Bradley Brandt MRN: [id number] Date of Birth: 1981-02-11 Referring Provider (PT): Marcie Mowers, NP   Encounter Date: 08/17/2020   PT End of Session - 08/17/20 1318    Visit Number 1    Number of Visits 13    Date for PT Re-Evaluation 10/05/20    Authorization Type [employer] MCD    PT Start Time 1147    PT Stop Time 1230    PT Time Calculation (min) 43 min    Activity Tolerance Patient limited by pain    Behavior During Therapy [facility] for tasks assessed/performed           Past Medical History:  Diagnosis Date  . Bipolar 1 disorder (HCC)   . Bipolar disorder, unspecified (HCC) 01/03/2013  . MS (multiple sclerosis) (HCC) dx 2006   relapsing-remitting right sided weakness  . Neuromuscular disorder [facility])    MS    Past Surgical History:  Procedure Laterality Date  . TUMOR REMOVAL  1983   abdomen benign    There were no vitals filed for this visit.    Subjective Assessment - 08/17/20 1259    Subjective Pt. is a 40 y/o male referred to PT for chronic LBP with RLE sciatica/radicular symptoms, bilateral elbow pain left>right as well as falls with gait difficulties with underlying MS. Pt. reports diagnosed with MS around age 39. He reports multi-year history of chronic LBP issues but had symptom exacerbation along with onset RLE radiating symptoms s/p fall in late January of this year. For elbow pain pt. reports had fall around last October with impact to left UE. X-rays (-) for fracture but he has continued to note left elbow pain and swelling. For back he reports constant pain extending from right lumbar region distally down RLE down calf to foot-symptoms exacerbated with prolinged sitting as well as standing and walking, mild ease noted with standing trunk flexion/leaning forward with  arms supported. He has been in pain management in the past and received new referral for this 07/26/20 but has not received any word from pain clinic-discussed check with referring provider re: status of referral.    Pertinent History MS, chronic pain history, falls, bipolar    Limitations House hold activities;Sitting;Lifting;Standing;Walking    Diagnostic tests Elbow X-rays 10/21, no recent lumbar imaging    Patient Stated Goals Get back and elbow better    Currently in Pain? Yes    Pain Score 6     Pain Location Back    Pain Orientation Right;Lower    Pain Descriptors / Indicators --   "electric shock"   Pain Type Chronic pain   acute on chronic s/p exacerbation last January   Pain Radiating Towards RLE down posterior leg and calf distally to foot    Pain Onset More than a month ago    Pain Frequency Constant    Aggravating Factors  sitting, standing/walking    Pain Relieving Factors leaning forward    Effect of Pain on Daily Activities limits positional and activity tolerance              [facility] PT Assessment - 08/17/20 0001      Assessment   Medical Diagnosis Chronic LBP with right sciatica, bilateral elbow pain, MS, frequent falls    Referring Provider (PT) Marcie Mowers, NP    Onset Date/Surgical Date  07/10/20   exacerbation LBP 07/10/20, elbow pain since 10/21   Hand Dominance Right    Prior Therapy none      Precautions   Precautions Fall      Restrictions   Weight Bearing Restrictions No      Balance Screen   Has the patient fallen in the past 6 months Yes    How many times? 5-6      Home Environment   Living Environment Private residence    Living Arrangements Alone    Type of Home House    Home Access Level entry    Home Layout One level    Home Equipment Walker - 4 wheels;Cane - single point;Shower seat      Prior Function   Level of Independence Independent with community mobility with device   uses SPC vs. rollator walker     Cognition   Overall  Cognitive Status Within Functional Limits for tasks assessed      Observation/Other Assessments   Focus on Therapeutic Outcomes (FOTO)  not assessed-MCD      Sensation   Light Touch Impaired Detail    Light Touch Impaired Details Impaired RLE    Additional Comments decreased sensation on right at L2, L4 and S1 dermatomes, absent sensation at L1 and L5, equivocal sensation bilat. at L3      Deep Tendon Reflexes   DTR Assessment Site Achilles;Patella    Patella DTR 4+    Achilles DTR 2+      ROM / Strength   AROM / PROM / Strength AROM;PROM;Strength      AROM   Overall AROM Comments Bilateral elbow AROM WFL, Unable to assess hip AROM/PROM due to limited tolerance and muscle guarding with attempted assessment in supine, assessment of lumbar AROM as noted impacted by decreased balance    AROM Assessment Site Lumbar    Lumbar Flexion 50   mild ease LBP   Lumbar Extension 10   increased LBP with increased RLE pain   Lumbar - Right Side Bend 20    Lumbar - Left Side Bend 20    Lumbar - Right Rotation WFL    Lumbar - Left Rotation WFL      PROM   Overall PROM Comments attempted hip PROM but unable to assess due to pain and guarding      Strength   Strength Assessment Site Ankle;Knee;Hip;Elbow    Right/Left Elbow Right;Left    Right Elbow Flexion 5/5    Right Elbow Extension 5/5    Left Elbow Flexion 5/5    Left Elbow Extension 4/5    Right/Left Hip Right;Left    Right Hip Flexion 4-/5    Right Hip External Rotation  4/5    Right Hip Internal Rotation 4/5    Left Hip Flexion 4+/5    Left Hip External Rotation 4+/5    Left Hip Internal Rotation 4+/5    Right/Left Knee Left;Right    Right Knee Extension 4/5    Left Knee Flexion 5/5    Left Knee Extension 5/5    Right/Left Ankle Right;Left    Right Ankle Dorsiflexion 4/5    Right Ankle Eversion 4/5    Left Ankle Dorsiflexion 4+/5    Left Ankle Inversion 4+/5    Left Ankle Eversion 4+/5      Flexibility   Soft Tissue  Assessment /Muscle Length --   limited ability to assess given difficulty tolerating hip PROM     Palpation   Palpation  comment Tender to palpation right lumbar paraspinals with peripheralization of RLE symptoms reported with pressure to right lumbar paraspinals, TTP with edema noted left olecranon region      Special Tests   Other special tests (+) SLR on right, Hoffman's (-)      Transfers   Five time sit to stand comments  18 sec      Ambulation/Gait   Gait Comments TUG x 23 sec with SPC, pt. ambulates in clinic mod I with SPC with ataxic gait pattern/wide BOS with antlagic gait pattern and decreased right>left steplength                      Objective measurements completed on examination: See above findings.       [facility] Adult PT Treatment/Exercise - 08/17/20 0001      Exercises   Exercises --   attempted POE for assessment with pt. noting onset of numbness RLE                 PT Education - 08/17/20 1317    Education Details POC, centralization vs. peripheralization for RLE/lumbar symptoms    Person(s) Educated Patient    Methods Explanation    Comprehension Verbalized understanding            PT Short Term Goals - 08/17/20 1326      PT SHORT TERM GOAL #1   Title Independent with initial HEP    Baseline needs HEP    Time 3    Period Weeks    Status New    Target Date 09/07/20      PT SHORT TERM GOAL #2   Title Improve TUG time to <20 sec to work towards decreased fall risk    Baseline 23 sec    Time 3    Period Weeks    Status New    Target Date 09/07/20      PT SHORT TERM GOAL #3   Title Assess Berg Balance by visit 3 to establish LTG for balance/decreasing fall risk    Baseline 6/10 pain exacerbated with prolonged sitting and standing/walking    Time 2    Period Weeks    Status New    Target Date 08/31/20             PT Long Term Goals - 08/17/20 1328      PT LONG TERM GOAL #1   Title Improve 5 times sit<>stand to 12  sec or less to improve ability for transfers and decreased fall risk    Baseline 18 sec    Time 6    Period Weeks    Status New    Target Date 10/05/20      PT LONG TERM GOAL #2   Title Increase left elbow extension strength to 5/5 to improve ability for using UE support for sit>stand transfers and ability for arm use for chores at home    Baseline 4/5    Time 6    Period Weeks    Status New    Target Date 10/05/20      PT LONG TERM GOAL #3   Title Tolerate sitting/standing periods at least 20-30 min with LBP/RLE pain 3/10 or less to improve positional tolerance and ability for chores and community ambulation for activities such as shopping    Baseline 6/10    Time 6    Period Weeks    Status New    Target Date 10/05/20  PT LONG TERM GOAL #4   Title Sharlene Motts goal to be established on assessment    Time 6    Period Weeks                  Plan - 08/17/20 1319    Clinical Impression Statement Lumbar: Pt. presents with recent exacerbation of chronic LBP symptoms with onset RLE radicular pain. Attempted assessment of directional preference-per subjective mild flexion bias for trunk ROM noted with symptom ease with supported trunk flexion in standing but limited tolerance attempted SKTC in supine with increased LBP. Pt. did note numbness in RLE with attempted prone on elbows and also peripheralization of RLE pain with standing trunk extension so plan trial flexion bias ROM with PT moving forward. Elbow: Pt. presents with residual left>right elbow pain and swelling s/p fall last October with X-rays (-) for fracture. Pain and weakness noted with elbow extension ROM/tricep weakness. Differential diagnosis could include olecranon bursitis s/p fall with residual inflammation and swelling. No time for formal assessment of Berg at eval but findings for TUG and 5 times sit<>stand consistent with increased fall risk. Pt. would benefit from PT to help relieve lumbar and elbow pain and to work  on improving safety with mobility to help decrease fall risk.    Personal Factors and Comorbidities Comorbidity 3+    Comorbidities MS, bipolar, chronic pain history    Examination-Activity Limitations Stand;Stairs;Squat;Lift;Bend;Sit;Transfers;Locomotion Level    Examination-Participation Restrictions Shop;Meal Prep;Community Activity;Laundry;Cleaning    Stability/Clinical Decision Making Evolving/Moderate complexity    Clinical Decision Making High    Rehab Potential Fair    PT Frequency 2x / week    PT Duration 6 weeks    PT Treatment/Interventions Cryotherapy;ADLs/Self Care Home Management;Electrical Stimulation;Iontophoresis 4mg /ml Dexamethasone;Ultrasound;Therapeutic exercise;Patient/family education;Manual techniques;Therapeutic activities;Functional mobility training;Stair training;Gait training;Balance training;Neuromuscular re-education;Dry needling;Taping;Traction    PT Next Visit Plan Assess Berg pending time, trial seated trunk flexion to centralize RLE pain, potential [place] and/or ionto left elbow, further flexion bias lumbar ROM as tolerated pending symptoms, add/progress balance/propriocpetive challenges and gait training as tolerated, add initial HEP (no time at eval)    PT Home Exercise Plan no time for HEP at eval    Consulted and Agree with Plan of Care Patient           Patient will benefit from skilled therapeutic intervention in order to improve the following deficits and impairments:  Pain,Impaired flexibility,Decreased strength,Decreased activity tolerance,Increased edema,Decreased range of motion,Impaired tone,Abnormal gait,Decreased balance,Difficulty walking,Impaired sensation  Visit Diagnosis: Chronic right-sided low back pain with right-sided sciatica  Pain in left elbow  Pain in right elbow  Repeated falls  Multiple sclerosis (HCC)  Muscle weakness (generalized)  Difficulty in walking, not elsewhere classified  Localized edema     Problem  List Patient Active Problem List   Diagnosis Date Noted  . Other insomnia 08/11/2020  . Generalized anxiety disorder 08/11/2020  . 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HCC) 12/31/2017  . Optic neuritis 08/02/2017  . High risk medication use 08/02/2017  . Numbness 12/16/2015  . Spastic gait 12/16/2015  . Urinary hesitancy 12/16/2015  . Low back pain with sciatica 09/28/2014  . Polysubstance dependence (HCC) 02/09/2014  . 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 Relapsing remitting multiple sclerosis (HCC) 05/28/2013  . Elevated BP 01/06/2013  . Bipolar disorder, unspecified (HCC) 01/03/2013  . Bipolar disorder (HCC) 01/03/2013  . Right hemiparesis (HCC) 01/02/2013  . Multiple sclerosis flare 01/02/2013  . Hemiplegia (HCC) 01/02/2013      Check  all possible CPT codes: [id number]- Therapeutic Exercise, [phone]- Neuro Re-education, [phone] - Gait Training, [phone] - Manual Therapy, [phone] - Therapeutic Activities, [id number] - Self Care, [phone] - Mechanical traction, 97014 - Electrical stimulation (unattended), [id number] - Iontophoresis, 97035 - Ultrasound and 97016 - Essie HartVaso          Shereece Wellborn, PT, DPT 08/17/20 1:34 PM      [facility] [address] [place], [place], [address] Phone: [phone]   Fax:  [phone]  Name: Bradley Brandt MRN: [id number] Date of Birth: July 29, 1980

## 2020-08-24 ENCOUNTER — Ambulatory Visit: Payer: Medicaid Other

## 2020-08-24 ENCOUNTER — Other Ambulatory Visit: Payer: Self-pay

## 2020-08-24 DIAGNOSIS — R296 Repeated falls: Secondary | ICD-10-CM

## 2020-08-24 DIAGNOSIS — R262 Difficulty in walking, not elsewhere classified: Secondary | ICD-10-CM

## 2020-08-24 DIAGNOSIS — M25522 Pain in left elbow: Secondary | ICD-10-CM

## 2020-08-24 DIAGNOSIS — M5441 Lumbago with sciatica, right side: Secondary | ICD-10-CM | POA: Diagnosis not present

## 2020-08-24 DIAGNOSIS — G35 Multiple sclerosis: Secondary | ICD-10-CM

## 2020-08-24 DIAGNOSIS — G8929 Other chronic pain: Secondary | ICD-10-CM

## 2020-08-24 DIAGNOSIS — M25521 Pain in right elbow: Secondary | ICD-10-CM

## 2020-08-24 DIAGNOSIS — M6281 Muscle weakness (generalized): Secondary | ICD-10-CM

## 2020-08-24 NOTE — Therapy (Signed)
[facility] [address] [place], [place], [address] Phone: [phone]   Fax:  [phone]  Physical Therapy Treatment  Patient Details  Name: Bradley Brandt MRN: [id number] Date of Birth: 1980/08/27 Referring Provider (PT): Marcie Mowers, NP   Encounter Date: 08/24/2020   PT End of Session - 08/24/20 1218    Visit Number 2    Number of Visits 13    Date for PT Re-Evaluation 10/05/20    Authorization Type [employer] MCD    Authorization Time Period auth pending    PT Start Time 1142    PT Stop Time 1227    PT Time Calculation (min) 45 min    Activity Tolerance Patient limited by pain    Behavior During Therapy [facility] for tasks assessed/performed           Past Medical History:  Diagnosis Date  . Bipolar 1 disorder (HCC)   . Bipolar disorder, unspecified (HCC) 01/03/2013  . MS (multiple sclerosis) (HCC) dx 2006   relapsing-remitting right sided weakness  . Neuromuscular disorder [facility])    MS    Past Surgical History:  Procedure Laterality Date  . TUMOR REMOVAL  1983   abdomen benign    There were no vitals filed for this visit.   Subjective Assessment - 08/24/20 1148    Subjective Patient reports feeling not too good with his back bothering him the most right now. He is unsure if he wants to continue PT because he doens't think it is going to help.    Currently in Pain? Yes    Pain Score 6     Pain Location Back    Pain Orientation Right;Lower    Pain Descriptors / Indicators Sharp    Pain Type Chronic pain    Pain Radiating Towards RLE down posterior leg and calf distally to foot    Pain Onset More than a month ago    Pain Frequency Constant    Multiple Pain Sites Yes    Pain Score 6    Pain Location Elbow    Pain Orientation Left    Pain Descriptors / Indicators Squeezing    Pain Onset More than a month ago    Pain Frequency Constant                             [facility] Adult PT  Treatment/Exercise - 08/24/20 0001      Standardized Balance Assessment   Standardized Balance Assessment Berg Balance Test      Berg Balance Test   Sit to Stand Able to stand  independently using hands    Standing Unsupported Able to stand safely 2 minutes    Sitting with Back Unsupported but Feet Supported on Floor or Stool Able to sit safely and securely 2 minutes    Stand to Sit Controls descent by using hands    Transfers Able to transfer safely, definite need of hands    Standing Unsupported with Eyes Closed Able to stand 3 seconds    Standing Ubsupported with Feet Together Able to place feet together independently but unable to hold for 30 seconds    From Standing, Reach Forward with Outstretched Arm Can reach forward >12 cm safely (5")    From Standing Position, Pick up Object from Floor Able to pick up shoe safely and easily    From Standing Position, Turn to Look Behind Over each Shoulder Looks behind from both  sides and weight shifts well    Turn 360 Degrees Able to turn 360 degrees safely but slowly    Standing Unsupported, Alternately Place Feet on Step/Stool Able to complete >2 steps/needs minimal assist    Standing Unsupported, One Foot in [employer] balance while stepping or standing    Standing on One Leg Tries to lift leg/unable to hold 3 seconds but remains standing independently    Total Score 36      Self-Care   Self-Care Other Self-Care Comments    Other Self-Care Comments  see patient education      Lumbar Exercises: Stretches   Double Knee to Chest Stretch Limitations 1 x 10 with strap    Lower Trunk Rotation 60 seconds    Figure 4 Stretch 30 seconds    Figure 4 Stretch Limitations bilateral      Lumbar Exercises: Seated   Other Seated Lumbar Exercises seated trunk flexion 1 x 10    Other Seated Lumbar Exercises hip flexion blue band 2 x 10      Lumbar Exercises: Supine   Clam 10 reps    Clam Limitations x2; blue band    Bridge 10 reps    Bridge  Limitations x 2                  PT Education - 08/24/20 1228    Education Details Education on role of PT is treating current condition and POC. Education on balance assessment findings. Education on HEP.    Person(s) Educated Patient    Methods Explanation;Demonstration;Verbal cues;Handout    Comprehension Verbalized understanding;Returned demonstration;Verbal cues required;Need further instruction            PT Short Term Goals - 08/17/20 1326      PT SHORT TERM GOAL #1   Title Independent with initial HEP    Baseline needs HEP    Time 3    Period Weeks    Status New    Target Date 09/07/20      PT SHORT TERM GOAL #2   Title Improve TUG time to <20 sec to work towards decreased fall risk    Baseline 23 sec    Time 3    Period Weeks    Status New    Target Date 09/07/20      PT SHORT TERM GOAL #3   Title Assess Berg Balance by visit 3 to establish LTG for balance/decreasing fall risk    Baseline 6/10 pain exacerbated with prolonged sitting and standing/walking    Time 2    Period Weeks    Status New    Target Date 08/31/20             PT Long Term Goals - 08/17/20 1328      PT LONG TERM GOAL #1   Title Improve 5 times sit<>stand to 12 sec or less to improve ability for transfers and decreased fall risk    Baseline 18 sec    Time 6    Period Weeks    Status New    Target Date 10/05/20      PT LONG TERM GOAL #2   Title Increase left elbow extension strength to 5/5 to improve ability for using UE support for sit>stand transfers and ability for arm use for chores at home    Baseline 4/5    Time 6    Period Weeks    Status New    Target Date 10/05/20  PT LONG TERM GOAL #3   Title Tolerate sitting/standing periods at least 20-30 min with LBP/RLE pain 3/10 or less to improve positional tolerance and ability for chores and community ambulation for activities such as shopping    Baseline 6/10    Time 6    Period Weeks    Status New    Target  Date 10/05/20      PT LONG TERM GOAL #4   Title Sharlene Motts goal to be established on assessment    Time 6    Period Weeks                 Plan - 08/24/20 1144    Clinical Impression Statement Patient arrived being apprehensive with continuing PT as he did not feel that it will help with his pain. After discussion with therapist regarding role of PT in treating his current condition patient agreed to continue with current POC at this time. Trialed lumbar flexion biased ROM with unclear response of centralization of pain as patient reported no effect on pain with repeated lumbar flexion in sitting, though reported resolution of pain following supine knees to chest that quickly returned to baseline pain level with all supine, seated, and standing activity. Further balance assessment performed today with patient scoring in high fall risk category based upon BERG balance test having the most difficulty with narrow stance balance activity.    Personal Factors and Comorbidities Comorbidity 3+    Comorbidities MS, bipolar, chronic pain history    Examination-Activity Limitations Stand;Stairs;Squat;Lift;Bend;Sit;Transfers;Locomotion Level    Examination-Participation Restrictions Shop;Meal Prep;Community Activity;Laundry;Cleaning    Stability/Clinical Decision Making Evolving/Moderate complexity    Rehab Potential Fair    PT Frequency 2x / week    PT Duration 6 weeks    PT Treatment/Interventions Cryotherapy;ADLs/Self Care Home Management;Electrical Stimulation;Iontophoresis 4mg /ml Dexamethasone;Ultrasound;Therapeutic exercise;Patient/family education;Manual techniques;Therapeutic activities;Functional mobility training;Stair training;Gait training;Balance training;Neuromuscular re-education;Dry needling;Taping;Traction    PT Next Visit Plan review HEP potential and/or ionto left elbow, further flexion bias lumbar ROM as tolerated pending symptoms, add/progress balance/propriocpetive challenges and  gait training as tolerated,    PT Home Exercise Plan Access Code [email or web address]    Consulted and Agree with Plan of Care Patient           Patient will benefit from skilled therapeutic intervention in order to improve the following deficits and impairments:  Pain,Impaired flexibility,Decreased strength,Decreased activity tolerance,Increased edema,Decreased range of motion,Impaired tone,Abnormal gait,Decreased balance,Difficulty walking,Impaired sensation  Visit Diagnosis: Chronic right-sided low back pain with right-sided sciatica  Pain in left elbow  Pain in right elbow  Repeated falls  Multiple sclerosis (HCC)  Muscle weakness (generalized)  Difficulty in walking, not elsewhere classified     Problem List Patient Active Problem List   Diagnosis Date Noted  . Other insomnia 08/11/2020  . Generalized anxiety disorder 08/11/2020  . 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HCC) 12/31/2017  . Optic neuritis 08/02/2017  . High risk medication use 08/02/2017  . Numbness 12/16/2015  . Spastic gait 12/16/2015  . Urinary hesitancy 12/16/2015  . Low back pain with sciatica 09/28/2014  . Polysubstance dependence (HCC) 02/09/2014  . 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 Relapsing remitting multiple sclerosis (HCC) 05/28/2013  . Elevated BP 01/06/2013  . Bipolar disorder, unspecified (HCC) 01/03/2013  . Bipolar disorder (HCC) 01/03/2013  . Right hemiparesis (HCC) 01/02/2013  . Multiple sclerosis flare 01/02/2013  . Hemiplegia (HCC) 01/02/2013   01/04/2013, PT, DPT, ATC 08/24/20 12:40 PM  [facility] [address]  [address] [place], [place], [address] Phone: [phone]   Fax:  [phone]  Name: Bradley JumboMichael K Brandt MRN: [id number] Date of Birth: [birth year]

## 2020-08-30 ENCOUNTER — Encounter: Payer: Self-pay | Admitting: Nurse Practitioner

## 2020-08-30 ENCOUNTER — Other Ambulatory Visit: Payer: Self-pay

## 2020-08-30 ENCOUNTER — Ambulatory Visit (INDEPENDENT_AMBULATORY_CARE_PROVIDER_SITE_OTHER): Payer: Medicaid Other | Admitting: Nurse Practitioner

## 2020-08-30 VITALS — BP 127/65 | HR 71 | Ht 71.0 in | Wt 187.0 lb

## 2020-08-30 DIAGNOSIS — M25521 Pain in right elbow: Secondary | ICD-10-CM

## 2020-08-30 DIAGNOSIS — M5441 Lumbago with sciatica, right side: Secondary | ICD-10-CM

## 2020-08-30 DIAGNOSIS — R261 Paralytic gait: Secondary | ICD-10-CM | POA: Diagnosis not present

## 2020-08-30 DIAGNOSIS — G8929 Other chronic pain: Secondary | ICD-10-CM

## 2020-08-30 DIAGNOSIS — G35 Multiple sclerosis: Secondary | ICD-10-CM

## 2020-08-30 DIAGNOSIS — M25522 Pain in left elbow: Secondary | ICD-10-CM

## 2020-08-30 NOTE — Patient Instructions (Signed)

## 2020-08-30 NOTE — Progress Notes (Signed)
[facility] [name] [address] [place], [place]  [address] Phone:  [phone]   Fax:  [phone]   Established Patient Office Visit  Subjective:  Patient ID: Bradley Brandt, male    DOB: October 12, 1980  Age: 40 y.o. MRN: [id number]  CC:  Chief Complaint  Patient presents with  . Pain Management    HPI Bradley Brandt presents for follow up.  has a past medical history of Bipolar 1 disorder (HCC), Bipolar disorder, unspecified (HCC) (01/03/2013), MS (multiple sclerosis) (HCC) (dx 2006), and Neuromuscular disorder (HCC).   He has MS and is followed by neurology. However admits that they have no additional treatment options for his pain. He had a pain management referral however this was denied due to no additional treatment option per the provider.  He is having 7/10 lower back pain.  He has pain that goes into right leg into his foot. He is having tingling. He  ist having weakness. He does have some problems with controlling bowel and bladder due to the pain and limitations with ambulation. He feels like the pain is getting worse.  Walking the pain gets worse. He is unable to rest. He admits that he uses THC to help the pain. He is interested in having injections if this will help his pain.  He is currently in PT. This is new treatment. He admits to continue. He uses [employer] and [employer].  He is currently on baclofen 20 mg 3 times daily, gabapentin 600 mg 3 times daily.  For treatment of his multiple sclerosis he is taking dalfampridine 10 mg every 12 hours and Natalizumab 300 mg q. 28 days.  Past Medical History:  Diagnosis Date  . Bipolar 1 disorder (HCC)   . Bipolar disorder, unspecified (HCC) 01/03/2013  . MS (multiple sclerosis) (HCC) dx 2006   relapsing-remitting right sided weakness  . Neuromuscular disorder [facility])    MS    Past Surgical History:  Procedure Laterality Date  . TUMOR REMOVAL  1983   abdomen benign    Family History  Adopted: Yes  Problem Relation Age  of Onset  . Lupus Mother   . Ataxia Neg Hx   . Chorea Neg Hx   . Dementia Neg Hx   . Mental retardation Neg Hx   . Migraines Neg Hx   . Multiple sclerosis Neg Hx   . Neurofibromatosis Neg Hx   . Neuropathy Neg Hx   . Parkinsonism Neg Hx   . Seizures Neg Hx   . Stroke Neg Hx     Social History   Socioeconomic History  . Marital status: Single    Spouse name: Not on file  . Number of children: Not on file  . Years of education: Not on file  . Highest education level: Not on file  Occupational History  . Not on file  Tobacco Use  . Smoking status: Former Smoker    Packs/day: 0.00  . Smokeless tobacco: Never Used  Vaping Use  . Vaping Use: Former  Substance and Sexual Activity  . Alcohol use: Yes    Comment: once a month  . Drug use: Yes    Frequency: 7.0 times per week    Types: Marijuana  . Sexual activity: Yes    Partners: Female  Other Topics Concern  . Not on file  Social History Narrative  . Not on file   Social Determinants of Health   Financial Resource Strain: Not on file  Food  Insecurity: Not on file  Transportation Needs: Not on file  Physical Activity: Not on file  Stress: Not on file  Social Connections: Not on file  Intimate Partner Violence: Not on file    Outpatient Medications Prior to Visit  Medication Sig Dispense Refill  . acetaminophen (TYLENOL) 500 MG tablet Take 500 mg by mouth 2 (two) times daily.    . baclofen (LIORESAL) 20 MG tablet Take 1 tablet (20 mg total) by mouth 3 (three) times daily. 90 tablet 11  . dalfampridine 10 MG TB12 Take 1 tablet by mouth every 12 hours. 60 tablet 11  . escitalopram (LEXAPRO) 10 MG tablet Take 1 tablet (10 mg total) by mouth daily. 30 tablet 1  . gabapentin (NEURONTIN) 600 MG tablet Take 1 tablet (600 mg total) by mouth 3 (three) times daily. 90 tablet 11  . hydrOXYzine (ATARAX/VISTARIL) 50 MG tablet Take 1 tablet (50 mg total) by mouth 3 (three) times daily as needed. 90 tablet 1  . natalizumab  (TYSABRI) 300 MG/15ML injection Inject 15 mLs (300 mg total) into the vein every 28 (twenty-eight) days. 15 mL 5  . QUEtiapine (SEROQUEL) 100 MG tablet Take 1.5 tablets (150 mg total) by mouth at bedtime. 30 tablet 1  . tamsulosin (FLOMAX) 0.4 MG CAPS capsule Take 2 capsules (0.8 mg total) by mouth daily. 60 capsule 11  . Vitamin D, Ergocalciferol, (DRISDOL) 1.25 MG (50000 UNIT) CAPS capsule Take 1 capsule (50,000 Units total) by mouth every 7 (seven) days. 13 capsule 3   No facility-administered medications prior to visit.    Allergies  Allergen Reactions  . Lithium Rash    ROS Review of Systems  Constitutional: Negative.   HENT: Negative.   Eyes: Negative.   Respiratory: Positive for shortness of breath ( worse with the pain).   Cardiovascular: Positive for chest pain (worse wtih pain).  Endocrine: Negative.   Genitourinary: Negative.   Musculoskeletal: Positive for back pain and gait problem. Negative for joint swelling.  Skin: Negative.   Allergic/Immunologic: Negative.   Neurological: Negative for weakness.      Objective:    Physical Exam Constitutional:      General: He is not in acute distress.    Appearance: He is normal weight. He is not ill-appearing, toxic-appearing or diaphoretic.  HENT:     Head: Normocephalic and atraumatic.     Nose: Nose normal.     Mouth/Throat:     Mouth: Mucous membranes are moist.  Cardiovascular:     Rate and Rhythm: Normal rate and regular rhythm.     Pulses: Normal pulses.  Pulmonary:     Effort: Pulmonary effort is normal.     Breath sounds: Normal breath sounds.  Abdominal:     Palpations: Abdomen is soft.  Musculoskeletal:     Right elbow: Normal range of motion.     Left elbow: Swelling (improved from previous brusitis ) present. Normal range of motion. No tenderness.     Cervical back: Normal range of motion.     Comments: Cane in use  Skin:    General: Skin is warm and dry.     Capillary Refill: Capillary refill  takes less than 2 seconds.  Neurological:     General: No focal deficit present.     Mental Status: He is alert and oriented to person, place, and time.     Gait: Gait abnormal.  Psychiatric:        Mood and Affect: Mood normal.  Behavior: Behavior normal.        Thought Content: Thought content normal.     BP 127/65   Pulse 71   Ht 5\' 11"  (1.803 m)   Wt 187 lb (84.8 kg)   SpO2 99%   BMI 26.08 kg/m  Wt Readings from Last 3 Encounters:  08/30/20 187 lb (84.8 kg)  07/26/20 200 lb (90.7 kg)  05/26/20 189 lb 12.8 oz (86.1 kg)     Health Maintenance Due  Topic Date Due  . TETANUS/TDAP  Never done  . INFLUENZA VACCINE  Never done    There are no preventive care reminders to display for this patient.  No results found for: TSH Lab Results  Component Value Date   WBC 8.7 05/26/2020   HGB 15.7 05/26/2020   HCT 46.1 05/26/2020   MCV 95 05/26/2020   PLT 213 05/26/2020   Lab Results  Component Value Date   NA 140 04/08/2020   K 4.5 04/08/2020   CO2 22 12/31/2017   GLUCOSE 91 04/08/2020   BUN 19 04/08/2020   CREATININE 1.05 (H) 04/08/2020   BILITOT 0.4 04/08/2020   ALKPHOS 79 04/08/2020   AST 19 04/08/2020   ALT 13 12/16/2015   PROT 7.0 04/08/2020   ALBUMIN 4.5 04/08/2020   CALCIUM 9.7 04/08/2020   ANIONGAP 14 02/09/2014   Lab Results  Component Value Date   CHOL 194 04/08/2020   Lab Results  Component Value Date   HDL 38 (L) 04/08/2020   Lab Results  Component Value Date   LDLCALC 136 (H) 04/08/2020   Lab Results  Component Value Date   TRIG 110 04/08/2020   Lab Results  Component Value Date   CHOLHDL 5.1 (H) 04/08/2020   No results found for: HGBA1C    Assessment & Plan:   Problem List Items Addressed This Visit      Nervous and Auditory   Low back pain with sciatica - Primary Worsening in physical therapy however would like additional treatments to help manage pain   Relevant Orders   Ambulatory referral to Pain Clinic    Relapsing remitting multiple sclerosis (HCC) Stable with current treatment regimen continue to follow with neurology as scheduled     Other   Spastic gait Persistent continue on baclofen admits to only 1 fall in the last several months.  Did encourage him to speak with physical therapy on this issue    Other Visit Diagnoses    Pain of both elbows     Persistent left bursitis however improved encourage patient to avoid increased pressure to the area      No orders of the defined types were placed in this encounter.   Follow-up: Return in about 3 months (around 11/30/2020).    12/02/2020, NP

## 2020-08-31 ENCOUNTER — Ambulatory Visit: Payer: Medicaid Other

## 2020-08-31 DIAGNOSIS — M25521 Pain in right elbow: Secondary | ICD-10-CM

## 2020-08-31 DIAGNOSIS — R600 Localized edema: Secondary | ICD-10-CM

## 2020-08-31 DIAGNOSIS — M5441 Lumbago with sciatica, right side: Secondary | ICD-10-CM

## 2020-08-31 DIAGNOSIS — R262 Difficulty in walking, not elsewhere classified: Secondary | ICD-10-CM

## 2020-08-31 DIAGNOSIS — M6281 Muscle weakness (generalized): Secondary | ICD-10-CM

## 2020-08-31 DIAGNOSIS — M25522 Pain in left elbow: Secondary | ICD-10-CM

## 2020-08-31 DIAGNOSIS — R296 Repeated falls: Secondary | ICD-10-CM

## 2020-08-31 DIAGNOSIS — G35 Multiple sclerosis: Secondary | ICD-10-CM

## 2020-08-31 DIAGNOSIS — G8929 Other chronic pain: Secondary | ICD-10-CM

## 2020-08-31 NOTE — Therapy (Signed)
[facility] Outpatient Rehabilitation [facility] [address] [place], [place], [address] Phone: [phone]   Fax:  [phone]  Physical Therapy Treatment  Patient Details  Name: Bradley Brandt MRN: [id number] Date of Birth: April 02, 1981 Referring Provider (PT): Marcie Mowers, NP   Encounter Date: 08/31/2020   PT End of Session - 08/31/20 1322    Visit Number 3    Number of Visits 13    Date for PT Re-Evaluation 10/05/20    Authorization Type [employer]    Authorization Time Period 3/8-4/14/22    Authorization - Visit Number 2    Authorization - Number of Visits 12    PT Start Time 1147    PT Stop Time 1230    PT Time Calculation (min) 43 min    Equipment Utilized During Treatment Gait belt;Other (comment)   SPC   Activity Tolerance Patient tolerated treatment well    Behavior During Therapy [facility] for tasks assessed/performed           Past Medical History:  Diagnosis Date  . Bipolar 1 disorder (HCC)   . Bipolar disorder, unspecified (HCC) 01/03/2013  . MS (multiple sclerosis) (HCC) dx 2006   relapsing-remitting right sided weakness  . Neuromuscular disorder [facility])    MS    Past Surgical History:  Procedure Laterality Date  . TUMOR REMOVAL  1983   abdomen benign    There were no vitals filed for this visit.   Subjective Assessment - 08/31/20 1150    Subjective "I'm in pain, I need to work on my balance. I fell four times yesterday." He reports repeated flexion helps with his pain some because it makes it easier to get up and walk after he does it, but states he hasn't been completing other exercises. He reports he just loses his balance and falls backwards.    Currently in Pain? Yes    Pain Score 7     Pain Location Back    Pain Orientation Right;Lower    Pain Descriptors / Indicators Pins and needles    Pain Type Chronic pain    Pain Radiating Towards RLE down posterior leg and calf distally to foot    Pain Onset More than a month ago     Multiple Pain Sites Yes    Pain Score 8    Pain Location Elbow    Pain Orientation Left    Pain Descriptors / Indicators Squeezing    Pain Type Chronic pain    Pain Onset More than a month ago                             [facility] Adult PT Treatment/Exercise - 08/31/20 0001      Ambulation/Gait   Ambulation/Gait Yes    Ambulation/Gait Assistance 4: Min guard;6: Modified independent (Device/Increase time)    Ambulation Distance (Feet) 100 Feet    Assistive device Straight cane    Gait Pattern Step-through pattern;Ataxic;Wide base of support    Gait Comments SPC in Lt hand      High Level Balance   High Level Balance Activities Other (comment)    High Level Balance Comments reactive postural control posterior   PT tech assistance (SBA-Min A)     Self-Care   Other Self-Care Comments  see patient education      Manual Therapy   Manual Therapy Soft tissue mobilization    Soft tissue mobilization Lt elbow tricep, forearm extensor mass  PT Education - 08/31/20 1320    Education Details Education on importance of complying with prescribed HEP. Education on utilizing [facility] at home as his walking stick is too short for him.    Person(s) Educated Patient    Methods Explanation    Comprehension Verbalized understanding            PT Short Term Goals - 08/31/20 1338      PT SHORT TERM GOAL #1   Title Independent with initial HEP    Baseline reports not completing all exercises    Time 3    Period Weeks    Status On-going    Target Date 09/07/20      PT SHORT TERM GOAL #2   Title Improve TUG time to <20 sec to work towards decreased fall risk    Baseline 23 sec    Time 3    Period Weeks    Status Unable to assess    Target Date 09/07/20      PT SHORT TERM GOAL #3   Title Assess Berg Balance by visit 3 to establish LTG for balance/decreasing fall risk    Baseline Berg 36    Time 2    Period Weeks    Status Achieved    Target Date  08/31/20             PT Long Term Goals - 08/31/20 1339      PT LONG TERM GOAL #1   Title Improve 5 times sit<>stand to 12 sec or less to improve ability for transfers and decreased fall risk    Baseline 18 sec    Time 6    Period Weeks    Status New      PT LONG TERM GOAL #2   Title Increase left elbow extension strength to 5/5 to improve ability for using UE support for sit>stand transfers and ability for arm use for chores at home    Baseline 4/5    Time 6    Period Weeks    Status New      PT LONG TERM GOAL #3   Title Tolerate sitting/standing periods at least 20-30 min with LBP/RLE pain 3/10 or less to improve positional tolerance and ability for chores and community ambulation for activities such as shopping    Baseline 6/10    Time 6    Period Weeks    Status New      PT LONG TERM GOAL #4   Title Patient will score at least 41 on the BERG balance assessment to signify improvements in balance and safety.    Baseline 36    Time 6    Period Weeks    Status Revised                 Plan - 08/31/20 1147    Clinical Impression Statement Session focused on gait training with SPC in the Lt hand as patient arrives using walking stick in the Rt hand that is too short and only occasionally contacts the floor when using the walking stick. With continued practice patient able to demonstrate step through pattern with SPC in the Lt hand and allow for appropriate floor contact of SPC during stance phase on the RLE. Patient instructed to bring [facility] from home for next session as his walking stick is not the appropriate height with patient verbalizing understanding. Per patient's request began addressing balance impairments focusing on posterior reactive balance with patient demonstrating delayed stepping strategy, excessive  trunk extension, and was initially only taking one large lateral step on the RLE with inability to regain balance independently. Focused on having patient take  smaller, posterior steps on either foot to regain balance with patient requiring intermittent Min A to regain balance. Patient requires continued practice on improving reactive balance strategy to assist in reducing fall risk. Overall good tolerance to today's session with patient reporting a reduction in LBP at end of session rated as 5/10 and abolishment of LT elbow pain following manual therapy.    Personal Factors and Comorbidities Comorbidity 3+    Comorbidities MS, bipolar, chronic pain history    Examination-Activity Limitations Stand;Stairs;Squat;Lift;Bend;Sit;Transfers;Locomotion Level    Examination-Participation Restrictions Shop;Meal Prep;Community Activity;Laundry;Cleaning    Stability/Clinical Decision Making Evolving/Moderate complexity    Rehab Potential Fair    PT Frequency 2x / week    PT Duration 6 weeks    PT Treatment/Interventions Cryotherapy;ADLs/Self Care Home Management;Electrical Stimulation;Iontophoresis 4mg /ml Dexamethasone;Ultrasound;Therapeutic exercise;Patient/family education;Manual techniques;Therapeutic activities;Functional mobility training;Stair training;Gait training;Balance training;Neuromuscular re-education;Dry needling;Taping;Traction    PT Next Visit Plan review HEP potential and/or ionto left elbow, further flexion bias lumbar ROM as tolerated pending symptoms, add/progress balance/propriocpetive challenges and gait training as tolerated,    PT Home Exercise Plan Access Code [email or web address]    Consulted and Agree with Plan of Care Patient           Patient will benefit from skilled therapeutic intervention in order to improve the following deficits and impairments:  Pain,Impaired flexibility,Decreased strength,Decreased activity tolerance,Increased edema,Decreased range of motion,Impaired tone,Abnormal gait,Decreased balance,Difficulty walking,Impaired sensation  Visit Diagnosis: Chronic right-sided low back pain with right-sided sciatica  Pain in left  elbow  Pain in right elbow  Repeated falls  Multiple sclerosis (HCC)  Muscle weakness (generalized)  Difficulty in walking, not elsewhere classified  Localized edema     Problem List Patient Active Problem List   Diagnosis Date Noted  . Other insomnia 08/11/2020  . Generalized anxiety disorder 08/11/2020  . 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HCC) 12/31/2017  . Optic neuritis 08/02/2017  . High risk medication use 08/02/2017  . Vitamin D deficiency 09/19/2016  . Hypercholesterolemia 09/19/2016  . Numbness 12/16/2015  . Spastic gait 12/16/2015  . Urinary hesitancy 12/16/2015  . Low back pain with sciatica 09/28/2014  . Polysubstance dependence (HCC) 02/09/2014  . 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 Relapsing remitting multiple sclerosis (HCC) 05/28/2013  . Elevated BP 01/06/2013  . Bipolar disorder, unspecified (HCC) 01/03/2013  . Bipolar disorder (HCC) 01/03/2013  . Right hemiparesis (HCC) 01/02/2013  . Multiple sclerosis flare 01/02/2013  . Hemiplegia (HCC) 01/02/2013   01/04/2013, PT, DPT, ATC 08/31/20 1:50 PM [facility] [address] [place], [place], [place] Phone: [phone]   Fax:  [phone]  Name: Bradley Brandt MRN: [id number] Date of Birth: [birth year]

## 2020-09-02 ENCOUNTER — Ambulatory Visit: Payer: Medicaid Other

## 2020-09-07 ENCOUNTER — Ambulatory Visit: Payer: Medicaid Other

## 2020-09-07 ENCOUNTER — Other Ambulatory Visit: Payer: Self-pay

## 2020-09-07 ENCOUNTER — Encounter: Payer: Medicaid Other | Admitting: Physical Therapy

## 2020-09-07 DIAGNOSIS — G35 Multiple sclerosis: Secondary | ICD-10-CM

## 2020-09-07 DIAGNOSIS — M25522 Pain in left elbow: Secondary | ICD-10-CM

## 2020-09-07 DIAGNOSIS — R296 Repeated falls: Secondary | ICD-10-CM

## 2020-09-07 DIAGNOSIS — R600 Localized edema: Secondary | ICD-10-CM

## 2020-09-07 DIAGNOSIS — R262 Difficulty in walking, not elsewhere classified: Secondary | ICD-10-CM

## 2020-09-07 DIAGNOSIS — M6281 Muscle weakness (generalized): Secondary | ICD-10-CM

## 2020-09-07 DIAGNOSIS — M25521 Pain in right elbow: Secondary | ICD-10-CM

## 2020-09-07 DIAGNOSIS — M5441 Lumbago with sciatica, right side: Secondary | ICD-10-CM | POA: Diagnosis not present

## 2020-09-07 DIAGNOSIS — G8929 Other chronic pain: Secondary | ICD-10-CM

## 2020-09-07 NOTE — Therapy (Signed)
[facility] Outpatient Rehabilitation [facility] [address] [place], [place], [address] Phone: [phone]   Fax:  [phone]  Physical Therapy Treatment  Patient Details  Name: Bradley Brandt MRN: [id number] Date of Birth: 11-Jul-1980 Referring Provider (PT): Marcie Mowers, NP   Encounter Date: 09/07/2020   PT End of Session - 09/07/20 1143    Visit Number 4    Number of Visits 13    Date for PT Re-Evaluation 10/05/20    Authorization Type [employer] M[employer]    Authorization Time Period 3/8-4/14/22    Authorization - Visit Number 3    Authorization - Number of Visits 12    PT Start Time 1145    PT Stop Time 1227    PT Time Calculation (min) 42 min    Equipment Utilized During Treatment Gait belt;Other (comment)   walking stick   Activity Tolerance Patient tolerated treatment well    Behavior During Therapy WFL for tasks assessed/performed           Past Medical History:  Diagnosis Date  . Bipolar 1 disorder (HCC)   . Bipolar disorder, unspecified (HCC) 01/03/2013  . MS (multiple sclerosis) (HCC) dx 2006   relapsing-remitting right sided weakness  . Neuromuscular disorder [facility])    MS    Past Surgical History:  Procedure Laterality Date  . TUMOR REMOVAL  1983   abdomen benign    There were no vitals filed for this visit.   Subjective Assessment - 09/07/20 1148    Subjective "I'm doing pretty good today, was sore last time." Patinet reports the Lt elbow is feeling ok, but the pain is now in the Rt elbow. Patient reports his back is feeling about the same. He has not heard from pain management to schedule an appointment and is asking for pain medication. Patient denies any falls since last session.    Currently in Pain? Yes    Pain Score 7     Pain Location Back    Pain Orientation Right;Lower    Pain Descriptors / Indicators Pins and needles    Pain Type Chronic pain    Pain Onset More than a month ago    Multiple Pain Sites No    Pain Onset More  than a month ago              [facility] PT Assessment - 09/07/20 0001      Posture/Postural Control   Posture Comments Lt lateral shift in standing                         [facility] Adult PT Treatment/Exercise - 09/07/20 0001      Ambulation/Gait   Ambulation/Gait Yes    Ambulation Distance (Feet) 50 Feet    Assistive device --   walking stick   Gait Pattern Step-through pattern;Ataxic;Wide base of support    Gait Comments walking stick      Lumbar Exercises: Standing   Other Standing Lumbar Exercises Lt lateral shift correction x 10    Other Standing Lumbar Exercises heel/toe rocks A/P 1 x 10      Knee/Hip Exercises: Standing   Hip Flexion 10 reps    Hip Abduction 10 reps    Abduction Limitations x 2    Hip Extension 10 reps    Extension Limitations x 2    Other Standing Knee Exercises sit to stand at parallel bars with BUE support 2 x 10;    Other Standing Knee  Exercises step taps 4 inch 10 reps                  PT Education - 09/07/20 1234    Education Details Recommended to f/u with PCP regarding referral to pain management.    Person(s) Educated Patient    Methods Explanation    Comprehension Verbalized understanding            PT Short Term Goals - 08/31/20 1338      PT SHORT TERM GOAL #1   Title Independent with initial HEP    Baseline reports not completing all exercises    Time 3    Period Weeks    Status On-going    Target Date 09/07/20      PT SHORT TERM GOAL #2   Title Improve TUG time to <20 sec to work towards decreased fall risk    Baseline 23 sec    Time 3    Period Weeks    Status Unable to assess    Target Date 09/07/20      PT SHORT TERM GOAL #3   Title Assess Berg Balance by visit 3 to establish LTG for balance/decreasing fall risk    Baseline Berg 36    Time 2    Period Weeks    Status Achieved    Target Date 08/31/20             PT Long Term Goals - 08/31/20 1339      PT LONG TERM GOAL #1   Title  Improve 5 times sit<>stand to 12 sec or less to improve ability for transfers and decreased fall risk    Baseline 18 sec    Time 6    Period Weeks    Status New      PT LONG TERM GOAL #2   Title Increase left elbow extension strength to 5/5 to improve ability for using UE support for sit>stand transfers and ability for arm use for chores at home    Baseline 4/5    Time 6    Period Weeks    Status New      PT LONG TERM GOAL #3   Title Tolerate sitting/standing periods at least 20-30 min with LBP/RLE pain 3/10 or less to improve positional tolerance and ability for chores and community ambulation for activities such as shopping    Baseline 6/10    Time 6    Period Weeks    Status New      PT LONG TERM GOAL #4   Title Patient will score at least 41 on the BERG balance assessment to signify improvements in balance and safety.    Baseline 36    Time 6    Period Weeks    Status Revised                 Plan - 09/07/20 1207    Clinical Impression Statement Patient noted to have Lt lateral shift in standing that somewhat corrects with left lateral shift correction with patient reporting a reduction in low back pain rated as 5/10. Began targeted hip strengthening to assist in improving gait as he demonstrates excessive posterior trunk lean. Strengthening exercises performed in parallel bars with mirror used for visual feedback to assist in patient maintaining neutral trunk position.  Patient arrived with new walking stick and reviewed proper use of AD as patient continues to demonstrate only occasional floor contact during stance phase on the Lt, though with cueing patient  able to properly utilize walking stick.  Overall good tolerance to today's session with patient reporting soreness in his hips and a reduction in low back pain at end of session.   Personal Factors and Comorbidities Comorbidity 3+    Comorbidities MS, bipolar, chronic pain history    Examination-Activity Limitations  Stand;Stairs;Squat;Lift;Bend;Sit;Transfers;Locomotion Level    Examination-Participation Restrictions Shop;Meal Prep;Community Activity;Laundry;Cleaning    Stability/Clinical Decision Making Evolving/Moderate complexity    Rehab Potential Fair    PT Frequency 2x / week    PT Duration 6 weeks    PT Treatment/Interventions Cryotherapy;ADLs/Self Care Home Management;Electrical Stimulation;Iontophoresis 4mg /ml Dexamethasone;Ultrasound;Therapeutic exercise;Patient/family education;Manual techniques;Therapeutic activities;Functional mobility training;Stair training;Gait training;Balance training;Neuromuscular re-education;Dry needling;Taping;Traction    PT Next Visit Plan updated HEP. progress hip strengthening and balance as tolerated. potential and/or ionto left elbow, assess Lt lateral shift and potentially add to HEP.    PT Home Exercise Plan Access Code [email or web address]    Consulted and Agree with Plan of Care Patient           Patient will benefit from skilled therapeutic intervention in order to improve the following deficits and impairments:  Pain,Impaired flexibility,Decreased strength,Decreased activity tolerance,Increased edema,Decreased range of motion,Impaired tone,Abnormal gait,Decreased balance,Difficulty walking,Impaired sensation  Visit Diagnosis: Chronic right-sided low back pain with right-sided sciatica  Pain in left elbow  Pain in right elbow  Repeated falls  Multiple sclerosis (HCC)  Muscle weakness (generalized)  Difficulty in walking, not elsewhere classified  Localized edema     Problem List Patient Active Problem List   Diagnosis Date Noted  . Other insomnia 08/11/2020  . Generalized anxiety disorder 08/11/2020  . 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HCC) 12/31/2017  . Optic neuritis 08/02/2017  . High risk medication use 08/02/2017  . Vitamin D deficiency 09/19/2016  . Hypercholesterolemia 09/19/2016  . Numbness 12/16/2015  . Spastic gait 12/16/2015  . Urinary  hesitancy 12/16/2015  . Low back pain with sciatica 09/28/2014  . Polysubstance dependence (HCC) 02/09/2014  . 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 Relapsing remitting multiple sclerosis (HCC) 05/28/2013  . Elevated BP 01/06/2013  . Bipolar disorder, unspecified (HCC) 01/03/2013  . Bipolar disorder (HCC) 01/03/2013  . Right hemiparesis (HCC) 01/02/2013  . Multiple sclerosis flare 01/02/2013  . Hemiplegia (HCC) 01/02/2013   01/04/2013, PT, DPT, ATC 09/07/20 12:38 PM  [facility] [address] [place], [place], [place] Phone: [phone]   Fax:  [phone]  Name: DONTA FUSTER MRN: Rondel Jumbo Date of Birth: 10-Jun-1981

## 2020-09-09 ENCOUNTER — Encounter: Payer: Self-pay | Admitting: Physical Therapy

## 2020-09-09 ENCOUNTER — Other Ambulatory Visit: Payer: Self-pay

## 2020-09-09 ENCOUNTER — Ambulatory Visit: Payer: Medicaid Other | Admitting: Physical Therapy

## 2020-09-09 DIAGNOSIS — R600 Localized edema: Secondary | ICD-10-CM

## 2020-09-09 DIAGNOSIS — M25521 Pain in right elbow: Secondary | ICD-10-CM

## 2020-09-09 DIAGNOSIS — M5441 Lumbago with sciatica, right side: Secondary | ICD-10-CM | POA: Diagnosis not present

## 2020-09-09 DIAGNOSIS — G35 Multiple sclerosis: Secondary | ICD-10-CM

## 2020-09-09 DIAGNOSIS — R262 Difficulty in walking, not elsewhere classified: Secondary | ICD-10-CM

## 2020-09-09 DIAGNOSIS — G8929 Other chronic pain: Secondary | ICD-10-CM

## 2020-09-09 DIAGNOSIS — R296 Repeated falls: Secondary | ICD-10-CM

## 2020-09-09 DIAGNOSIS — M6281 Muscle weakness (generalized): Secondary | ICD-10-CM

## 2020-09-09 DIAGNOSIS — M25522 Pain in left elbow: Secondary | ICD-10-CM

## 2020-09-09 NOTE — Therapy (Signed)
[facility] Outpatient Rehabilitation [facility] [address] [place], [place], [address] Phone: [phone]   Fax:  [phone]  Physical Therapy Treatment  Patient Details  Name: Bradley Brandt MRN: [id number] Date of Birth: June 27, 1980 Referring Provider (PT): Marcie Mowers, NP   Encounter Date: 09/09/2020   PT End of Session - 09/09/20 1411    Visit Number 5    Number of Visits 13    Date for PT Re-Evaluation 10/05/20    Authorization Type [employer] M[employer]    Authorization Time Period 3/8-4/14/22    Authorization - Visit Number 4    Authorization - Number of Visits 12    PT Start Time 1324    PT Stop Time 1408    PT Time Calculation (min) 44 min    Equipment Utilized During Treatment Gait belt    Activity Tolerance Patient tolerated treatment well    Behavior During Therapy [facility] for tasks assessed/performed           Past Medical History:  Diagnosis Date  . Bipolar 1 disorder (HCC)   . Bipolar disorder, unspecified (HCC) 01/03/2013  . MS (multiple sclerosis) (HCC) dx 2006   relapsing-remitting right sided weakness  . Neuromuscular disorder [facility])    MS    Past Surgical History:  Procedure Laterality Date  . TUMOR REMOVAL  1983   abdomen benign    There were no vitals filed for this visit.   Subjective Assessment - 09/09/20 1413    Subjective Pt. reports had 4 falls yesterday-no clear cause of onset/did not trip just lost balance while walking. Pt. using SPC but has rollator at home so discussed use to improve safety/help prevent falls. He reports back symptoms seem to be eased if he leans forward to rest when standing/walking but (lean) does not seem to affect right leg symptoms. Pt. wishing to focus on his balance.    Pertinent History MS, chronic pain history, falls, bipolar                             [facility] Adult PT Treatment/Exercise - 09/09/20 0001      Lumbar Exercises: Stretches   Single Knee to Chest Stretch  Left;Right;5 reps;10 seconds      Lumbar Exercises: Standing   Shoulder Extension AROM;Both;20 reps    Theraband Level (Shoulder Extension) Level 3 (Green)    Other Standing Lumbar Exercises Lt lateral shift correction 2 x 10    Other Standing Lumbar Exercises Pall off press green band x 20 ea. way bilat.      Lumbar Exercises: Seated   Other Seated Lumbar Exercises seated on P-ball with min assist: rhythmic stabilization with resisted isometric turns and lateral shift by therapist 30 sec x 2, marches x 20 (performed in parallel bars)      Knee/Hip Exercises: Standing   Heel Raises Both;2 sets;10 reps    Heel Raises Limitations on Airex    Hip Flexion Limitations Airex marches x 20 reps    Hip Abduction 10 reps    Abduction Limitations x 2    Hip Extension 10 reps    Extension Limitations x 2    Rocker Board 2 minutes    Rocker Board Limitations dynamic balance lateral and fw/rev min assist at parallel bars    Other Standing Knee Exercises toe taps to 4 in. step x 20 min assist for balance      Manual Therapy   Manual Therapy Joint  mobilization    Joint Mobilization hip LAD grade I-III oscillations   initially on right side but then performed bilat. per pt. request                 PT Education - 09/09/20 1505    Education Details walker use, exercises/HEP    Person(s) Educated Patient    Methods Explanation;Demonstration;Verbal cues    Comprehension Verbalized understanding;Returned demonstration            PT Short Term Goals - 08/31/20 1338      PT SHORT TERM GOAL #1   Title Independent with initial HEP    Baseline reports not completing all exercises    Time 3    Period Weeks    Status On-going    Target Date 09/07/20      PT SHORT TERM GOAL #2   Title Improve TUG time to <20 sec to work towards decreased fall risk    Baseline 23 sec    Time 3    Period Weeks    Status Unable to assess    Target Date 09/07/20      PT SHORT TERM GOAL #3   Title  Assess Berg Balance by visit 3 to establish LTG for balance/decreasing fall risk    Baseline Berg 36    Time 2    Period Weeks    Status Achieved    Target Date 08/31/20             PT Long Term Goals - 08/31/20 1339      PT LONG TERM GOAL #1   Title Improve 5 times sit<>stand to 12 sec or less to improve ability for transfers and decreased fall risk    Baseline 18 sec    Time 6    Period Weeks    Status New      PT LONG TERM GOAL #2   Title Increase left elbow extension strength to 5/5 to improve ability for using UE support for sit>stand transfers and ability for arm use for chores at home    Baseline 4/5    Time 6    Period Weeks    Status New      PT LONG TERM GOAL #3   Title Tolerate sitting/standing periods at least 20-30 min with LBP/RLE pain 3/10 or less to improve positional tolerance and ability for chores and community ambulation for activities such as shopping    Baseline 6/10    Time 6    Period Weeks    Status New      PT LONG TERM GOAL #4   Title Patient will score at least 41 on the BERG balance assessment to signify improvements in balance and safety.    Baseline 36    Time 6    Period Weeks    Status Revised                 Plan - 09/09/20 1505    Clinical Impression Statement Worked on balance as noted per flowsheet with min assist required to maintain safety and to help correct tendency posterior trunk lean. Added core work with resistance bands and stability ball with good tolerance. Given report of back symptoms eased with forward flexion trial SKTC with good tolerance though with persisted RLE pain. Also brief trial hip LAD for lumbar symptom relief-initial relief noted but then pt. subsequently reported increased neck pain so held after brief trial.    Personal Factors and Comorbidities Comorbidity 3+  Comorbidities MS, bipolar, chronic pain history    Examination-Activity Limitations  Stand;Stairs;Squat;Lift;Bend;Sit;Transfers;Locomotion Level    Examination-Participation Restrictions Shop;Meal Prep;Community Activity;Laundry;Cleaning    Stability/Clinical Decision Making Evolving/Moderate complexity    Clinical Decision Making High    Rehab Potential Fair    PT Frequency 2x / week    PT Duration 6 weeks    PT Treatment/Interventions Cryotherapy;ADLs/Self Care Home Management;Electrical Stimulation;Iontophoresis 4mg /ml Dexamethasone;Ultrasound;Therapeutic exercise;Patient/family education;Manual techniques;Therapeutic activities;Functional mobility training;Stair training;Gait training;Balance training;Neuromuscular re-education;Dry needling;Taping;Traction    PT Next Visit Plan continue balance/gait and core work, lumbar exercises (potential flexion bias) as tolerated    PT Home Exercise Plan Access Code [email or web address]    Consulted and Agree with Plan of Care Patient           Patient will benefit from skilled therapeutic intervention in order to improve the following deficits and impairments:  Pain,Impaired flexibility,Decreased strength,Decreased activity tolerance,Increased edema,Decreased range of motion,Impaired tone,Abnormal gait,Decreased balance,Difficulty walking,Impaired sensation  Visit Diagnosis: Chronic right-sided low back pain with right-sided sciatica  Pain in left elbow  Pain in right elbow  Repeated falls  Multiple sclerosis (HCC)  Muscle weakness (generalized)  Difficulty in walking, not elsewhere classified  Localized edema     Problem List Patient Active Problem List   Diagnosis Date Noted  . Other insomnia 08/11/2020  . Generalized anxiety disorder 08/11/2020  . 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HCC) 12/31/2017  . Optic neuritis 08/02/2017  . High risk medication use 08/02/2017  . Vitamin D deficiency 09/19/2016  . Hypercholesterolemia 09/19/2016  . Numbness 12/16/2015  . Spastic gait 12/16/2015  . Urinary hesitancy 12/16/2015  . Low back  pain with sciatica 09/28/2014  . Polysubstance dependence (HCC) 02/09/2014  . 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 Relapsing remitting multiple sclerosis (HCC) 05/28/2013  . Elevated BP 01/06/2013  . Bipolar disorder, unspecified (HCC) 01/03/2013  . Bipolar disorder (HCC) 01/03/2013  . Right hemiparesis (HCC) 01/02/2013  . Multiple sclerosis flare 01/02/2013  . Hemiplegia (HCC) 01/02/2013    01/04/2013, PT, DPT 09/09/20 3:10 PM  [facility] Outpatient Rehabilitation [facility] [address] [place], [place], [place] Phone: [phone]   Fax:  [phone]  Name: Bradley Brandt MRN: Rondel Jumbo Date of Birth: [birth year]

## 2020-09-13 ENCOUNTER — Ambulatory Visit (HOSPITAL_COMMUNITY)
Admission: RE | Admit: 2020-09-13 | Discharge: 2020-09-13 | Disposition: A | Discharge status: Home / Self Care | Payer: Medicaid Other | Source: Ambulatory Visit | Attending: Neurology | Admitting: Neurology

## 2020-09-13 ENCOUNTER — Other Ambulatory Visit: Payer: Self-pay

## 2020-09-13 ENCOUNTER — Telehealth: Payer: Self-pay | Admitting: *Deleted

## 2020-09-13 DIAGNOSIS — G35 Multiple sclerosis: Secondary | ICD-10-CM | POA: Insufficient documentation

## 2020-09-13 MED ORDER — SODIUM CHLORIDE 0.9 % IV SOLN
INTRAVENOUS | Status: DC | PRN
  Administered 2020-09-13: 250 mL via INTRAVENOUS

## 2020-09-13 MED ORDER — SODIUM CHLORIDE 0.9 % IV SOLN
300.0000 mg | INTRAVENOUS | Status: DC
  Administered 2020-09-13: 300 mg via INTRAVENOUS
  Filled 2020-09-13: qty 15

## 2020-09-13 NOTE — Telephone Encounter (Signed)
Called pt. Scheduled appt for him to see Dr. Epimenio Foot tomorrow at 1015am/ We received message from infusion suite that he has had 10 falls in last month/feels speech is worsening.Asked pt to bring updated insurance cards w/ him to appt.

## 2020-09-13 NOTE — Progress Notes (Signed)
PATIENT CARE CENTER NOTE  Diagnosis:Multiple Sclerosis   Provider:Sater, Gerlene Burdock, MD   Procedure:Tysabri IV   Note:Patient arrived for Tysabri infusion. Pre-infusion, patient reported frequent falls (around 10) within the last month and increased speech difficulty. Kara Mead, RN at [facility] notified of patient's symptoms via secure chat. Per provider, ok to proceed with Tysabri infusion. Patient received Tysabri infusionvia PIV. Tolerated infusion well with no adverse reaction. Vital signs stable. Discharge instructions given. Patientdeclinedto stay for the1 hour post-infusionforobservation. Alert, oriented and ambulatory with cane at discharge.

## 2020-09-13 NOTE — Discharge Instructions (Signed)
Natalizumab injection What is this medicine? NATALIZUMAB (na ta LIZ you mab) is used to treat relapsing multiple sclerosis. This drug is not a cure. It is also used to treat Crohn's disease. This medicine may be used for other purposes; ask your health care provider or pharmacist if you have questions. COMMON BRAND NAME(S): Tysabri What should I tell my health care provider before I take this medicine? They need to know if you have any of these conditions:  immune system problems  progressive multifocal leukoencephalopathy (PML)  an unusual or allergic reaction to natalizumab, other medicines, foods, dyes, or preservatives  pregnant or trying to get pregnant  breast-feeding How should I use this medicine? This medicine is for infusion into a vein. It is given by a health care professional in a hospital or clinic setting. A special MedGuide will be given to you by the pharmacist with each prescription and refill. Be sure to read this information carefully each time. Talk to your pediatrician regarding the use of this medicine in children. This medicine is not approved for use in children. Overdosage: If you think you have taken too much of this medicine contact a poison control center or emergency room at once. NOTE: This medicine is only for you. Do not share this medicine with others. What if I miss a dose? It is important not to miss your dose. Call your doctor or health care professional if you are unable to keep an appointment. What may interact with this medicine? Do not take this medicine with any of the following medications:  biologic medicines such as adalimumab, certolizumab, etanercept, golimumab, infliximab This medicine may also interact with the following medications:  azathioprine  cyclosporine  interferons  6-mercaptopurine  methotrexate  other medicines that lower your chance of fighting an infection  steroid medicines like prednisone or  cortisone  vaccines This list may not describe all possible interactions. Give your health care provider a list of all the medicines, herbs, non-prescription drugs, or dietary supplements you use. Also tell them if you smoke, drink alcohol, or use illegal drugs. Some items may interact with your medicine. What should I watch for while using this medicine? Your condition will be monitored carefully while you are receiving this medicine. Visit your doctor for regular check ups. Tell your doctor or healthcare professional if your symptoms do not start to get better or if they get worse. Stay away from people who are sick. Call your doctor or health care professional for advice if you get a fever, chills or sore throat, or other symptoms of a cold or flu. Do not treat yourself. In some patients, this medicine may cause a serious brain infection that may cause death. If you have any problems seeing, thinking, speaking, walking, or standing, tell your doctor right away. If you cannot reach your doctor, get urgent medical care. What side effects may I notice from receiving this medicine? Side effects that you should report to your doctor or health care professional as soon as possible:  allergic reactions like skin rash, itching or hives, swelling of the face, lips, or tongue  breathing problems  changes in vision  chest pain  confusion  depressed mood  dizziness  feeling faint; lightheaded; falls  general ill feeling or flu-like symptoms  loss of memory  missed menstrual periods  muscle weakness  problems with balance, talking, or walking  signs and symptoms of liver injury like dark yellow or brown urine; general ill feeling or flu-like symptoms; light-colored   stools; loss of appetite; nausea; right upper belly pain; unusually weak or tired; yellowing of the eyes or skin  suicidal thoughts, mood changes  unusual bruising or bleeding  unusually weak or tired Side effects that  usually do not require medical attention (report to your doctor or health care professional if they continue or are bothersome):  headache  joint pain  muscle cramps  muscle pain  nausea, vomiting  pain, redness, or irritation at site where injected  tiredness This list may not describe all possible side effects. Call your doctor for medical advice about side effects. You may report side effects to FDA at 1-800-FDA-1088. Where should I keep my medicine? This drug is given in a hospital or clinic and will not be stored at home. NOTE: This sheet is a summary. It may not cover all possible information. If you have questions about this medicine, talk to your doctor, pharmacist, or health care provider.  2021 Elsevier/Gold Standard (2018-12-09 13:20:26)  

## 2020-09-14 ENCOUNTER — Ambulatory Visit: Payer: Medicaid Other

## 2020-09-14 ENCOUNTER — Encounter: Payer: Self-pay | Admitting: Neurology

## 2020-09-14 ENCOUNTER — Ambulatory Visit: Payer: Medicaid Other | Admitting: Neurology

## 2020-09-14 ENCOUNTER — Telehealth: Payer: Self-pay

## 2020-09-14 VITALS — BP 132/80 | HR 57 | Ht 71.0 in

## 2020-09-14 DIAGNOSIS — G35 Multiple sclerosis: Secondary | ICD-10-CM | POA: Diagnosis not present

## 2020-09-14 DIAGNOSIS — Z79899 Other long term (current) drug therapy: Secondary | ICD-10-CM

## 2020-09-14 DIAGNOSIS — R3911 Hesitancy of micturition: Secondary | ICD-10-CM

## 2020-09-14 DIAGNOSIS — R261 Paralytic gait: Secondary | ICD-10-CM

## 2020-09-14 DIAGNOSIS — M545 Low back pain, unspecified: Secondary | ICD-10-CM

## 2020-09-14 DIAGNOSIS — G8929 Other chronic pain: Secondary | ICD-10-CM

## 2020-09-14 DIAGNOSIS — G35D Multiple sclerosis, unspecified: Secondary | ICD-10-CM

## 2020-09-14 MED ORDER — MELOXICAM 15 MG PO TBDP: 30.0000 | ORAL_TABLET | Freq: Every day | ORAL | 3 refills | Status: DC

## 2020-09-14 MED ORDER — METHYLPREDNISOLONE 4 MG PO TBPK: ORAL_TABLET | ORAL | 0 refills | Status: DC

## 2020-09-14 NOTE — Telephone Encounter (Signed)
Spoke with patient regarding missed appointment. He reports he had another appointment this morning and wasn't able to make it to this one. Confirmed next scheduled appointment on 09/16/20.

## 2020-09-14 NOTE — Progress Notes (Signed)
[name]  PATIENT: Bradley Brandt DOB: May 22, 1981  REFERRING DOCTOR OR PCP:  Thomes Dinning SOURCE: patient, records form Dr. Gaynelle Adu, MRI reports and images on PACS  _________________________________   HISTORICAL  CHIEF COMPLAINT:  Chief Complaint  Patient presents with  . Follow-up    RM 13. In [facility] in office today. Last seen 05/26/20. Had last Tysabri infusion at [facility] yesterday. Nurse reported he has had 10 falls in the last month/speech is getting worse.    HISTORY OF PRESENT ILLNESS:  Bradley Brandt is a 40 y.o.  man with relapsing remitting multiple sclerosis.     Update 09/14/20   Mobility issues: He is having more difficulty with gait and has several recent falls.  He has injured his elbow.   He is less active due to reduced mobility.   He has difficulty performing ADLs including meal prep getting to/from bathroom, dressing, cleaning,, etc.   He fatigue easily also impacting the ADLs.     Currently, he uses a walker but this is not meeting his mobility needs as he is not getting from room to fall and falls frequently.    He can not self propel a standard wheelchair well due to fatigue.  A scooter will not help enough to the turning radius.  Therefore he needs an Mining engineer wheelchair.   Hand controls should be on the right.   He has the skill, cognition and motivation to operate an Mining engineer wheelchair.  He is able to transfer on/off.      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  He is on Tysabri and gets infusions at [facility].   He tolerates it well.   Last JCV Ab was negative at 0.16 on 05/26/2020.    Last MRIs showed no new lesions but he has multiple T2/FLAIR hyperintense foci in the hemispheres, pons, cerebellum and thalamus in the brain MRI (07/29/20) and multiple plaques in spinal cord as well as spinal stenosis at C5C6.     He has reduced gait due to balance, strength and spasticity.  He is  on baclofen for spasticity and dalfampridine to try to help walking but not helping much.   Vision is doing well and he has new glasses.    He has urinary hesitancy helped by tamsulosin.   He feels fatigued.   He feels he has more depression than earlier this year.  He has more anxiety.   He is going to start seeing psychiatry again.   He was diagnosed with bipolar disease   He is sleeping worse.[name]   He notes cognition is about the same.  Focus is sometimes poor.    He also reports back pain.   THis has not responded to NSAIDs and gabapentin.   He would like a referral to pain clinic.        MS history:  Bradley Brandt was diagnosed around 2001 when he presented with right-sided numbness and clumsiness and weakness.   Initially, he was on Avonex and then Rebif but had relapses on both of them.  He could not tolerate Tecfidera.   He was started on Tysabri about 1 year ago and his last infusion was at [facility] at 11/08/2015.  He was seen Dr. Gaynelle Adu [facility] and those records were reviewed. He did receive a course of Solu-Medrol March 2015 and another course of Solu-Medrol January 2016 when he presented with left leg weakness and numbness that  was new    An MRI of the brain 08/28/2013 personally reviewed shows old extensive white matter changes in both hemispheres with multiple sclerosis. There were new areas of restricted diffusion without definite enhancement that could be consistent with ongoing demyelination when compared to the MRI from 05/13/2013.  Imaging: MRI of the brain 08/30/2019 showed multiple T2/FLAIR hyperintense foci in the hemispheres, pons, cerebellum and thalamus in a pattern and configuration consistent with chronic demyelinating plaque associated with multiple sclerosis.  None of the foci appear to be acute and they do not enhance.  Compared to the MRI dated 01/14/2019, there is no interval progression.  MRI of the cervical spine 01/06/2018 showed T2 hyperintense foci  within the spinal cord posteriorly adjacent to C2, to the right adjacent to C3, to the left adjacent to C4 and centrally and to the right adjacent to C4-C5 and C5.   None of these appear to be acute and they were all present on the 2015 MRI.[name]    MRI of the brain 01/06/2018 showed multiple T2/FLAIR hyperintense foci in the hemispheres and in the left middle cerebellar peduncle consistent with chronic demyelinating plaque associated with multiple sclerosis.  None of the foci appears to be acute.  None of them enhance.  However, when compared to the 2015 MRI focus in the cerebellum and a couple foci in the hemispheres were not clearly present on the earlier MRI.   MRI of the cervical spine  07/29/20 shows Multilevel degenerative changes. Mild to moderate spinal canal stenosis and mild cord impingement at C5-6. Foraminal stenosis bilaterally at C5-6 and on the left at C6-7.  Multiple subtle T2 hyperintense lesions suggested within the cervical spinal cord, although, detail is significantly degraded by motion. No enhancement appreciated  MRI of the brain 07/29/20 shows multiple T2/FLAIR hyperintense foci in the hemispheres and in the left middle cerebellar peduncle consistent with chronic demyelinating plaque associated with multiple sclerosis.  None of the foci appears to be acute.  None of them enhance.     REVIEW OF SYSTEMS: Constitutional: No fevers, chills, sweats, or change in appetite.   Notes fatigue Eyes: No visual changes, double vision, eye pain Ear, nose and throat: No hearing loss, ear pain, nasal congestion, sore throat Cardiovascular: No chest pain, palpitations Respiratory: No shortness of breath at rest or with exertion.   No wheezes GastrointestinaI: No nausea, vomiting, diarrhea, abdominal pain, fecal incontinence Genitourinary: He has hesitancy and frequency.   No UTI's. Musculoskeletal: No neck pain, back pain Integumentary: No rash, pruritus, skin lesions Neurological: as  above Psychiatric: No depression at this time.  No anxiety Endocrine: No palpitations, diaphoresis, change in appetite, change in weigh or increased thirst Hematologic/Lymphatic: No anemia, purpura, petechiae. Allergic/Immunologic: No itchy/runny eyes, nasal congestion, recent allergic reactions, rashes  ALLERGIES: Allergies  Allergen Reactions  . Lithium Rash    HOME MEDICATIONS:  Current Outpatient Medications:  .  acetaminophen (TYLENOL) 500 MG tablet, Take 500 mg by mouth 2 (two) times daily., Disp: , Rfl:  .  baclofen (LIORESAL) 20 MG tablet, Take 1 tablet (20 mg total) by mouth 3 (three) times daily., Disp: 90 tablet, Rfl: 11 .  dalfampridine 10 MG TB12, Take 1 tablet by mouth every 12 hours., Disp: 60 tablet, Rfl: 11 .  escitalopram (LEXAPRO) 10 MG tablet, Take 1 tablet (10 mg total) by mouth daily., Disp: 30 tablet, Rfl: 1 .  gabapentin (NEURONTIN) 600 MG tablet, Take 1 tablet (600 mg total) by mouth 3 (three) times daily., Disp:  90 tablet, Rfl: 11 .  hydrOXYzine (ATARAX/VISTARIL) 50 MG tablet, Take 1 tablet (50 mg total) by mouth 3 (three) times daily as needed., Disp: 90 tablet, Rfl: 1 .  Meloxicam 15 MG TBDP, Take 30 tablets by mouth daily., Disp: 30 tablet, Rfl: 3 .  methylPREDNISolone (MEDROL DOSEPAK) 4 MG TBPK tablet, 6 pills po x 1 day, then 5 pills po x 1 day, then 4 pills po x 1 day, then 3 pills po x 1 day, then 2 pills po x 1 day, then 1 pill po x 1 day, Disp: 21 tablet, Rfl: 0 .  natalizumab (TYSABRI) 300 MG/15ML injection, Inject 15 mLs (300 mg total) into the vein every 28 (twenty-eight) days., Disp: 15 mL, Rfl: 5 .  QUEtiapine (SEROQUEL) 100 MG tablet, Take 1.5 tablets (150 mg total) by mouth at bedtime., Disp: 30 tablet, Rfl: 1 .  tamsulosin (FLOMAX) 0.4 MG CAPS capsule, Take 2 capsules (0.8 mg total) by mouth daily., Disp: 60 capsule, Rfl: 11 .  Vitamin D, Ergocalciferol, (DRISDOL) 1.25 MG (50000 UNIT) CAPS capsule, Take 1 capsule (50,000 Units total) by mouth  every 7 (seven) days., Disp: 13 capsule, Rfl: 3  PAST MEDICAL HISTORY: Past Medical History:  Diagnosis Date  . Bipolar 1 disorder (HCC)   . Bipolar disorder, unspecified (HCC) 01/03/2013  . MS (multiple sclerosis) (HCC) dx 2006   relapsing-remitting right sided weakness  . Neuromuscular disorder (HCC)    MS    PAST SURGICAL HISTORY: Past Surgical History:  Procedure Laterality Date  . TUMOR REMOVAL  1983   abdomen benign    FAMILY HISTORY: Family History  Adopted: Yes  Problem Relation Age of Onset  . Lupus Mother   . Ataxia Neg Hx   . Chorea Neg Hx   . Dementia Neg Hx   . Mental retardation Neg Hx   . Migraines Neg Hx   . Multiple sclerosis Neg Hx   . Neurofibromatosis Neg Hx   . Neuropathy Neg Hx   . Parkinsonism Neg Hx   . Seizures Neg Hx   . Stroke Neg Hx     SOCIAL HISTORY:  Social History   Socioeconomic History  . Marital status: Single    Spouse name: Not on file  . Number of children: Not on file  . Years of education: Not on file  . Highest education level: Not on file  Occupational History  . Not on file  Tobacco Use  . Smoking status: Former Smoker    Packs/day: 0.00  . Smokeless tobacco: Never Used  Vaping Use  . Vaping Use: Former  Substance and Sexual Activity  . Alcohol use: Yes    Comment: once a month  . Drug use: Yes    Frequency: 7.0 times per week    Types: Marijuana  . Sexual activity: Yes    Partners: Female  Other Topics Concern  . Not on file  Social History Narrative  . Not on file   Social Determinants of Health   Financial Resource Strain: Not on file  Food Insecurity: Not on file  Transportation Needs: Not on file  Physical Activity: Not on file  Stress: Not on file  Social Connections: Not on file  Intimate Partner Violence: Not on file     PHYSICAL EXAM  Vitals:   09/14/20 0939  BP: 132/80  Pulse: (!) 57  SpO2: 98%  Height: 5\' 11"  (1.803 m)    Body mass index is 26.08 kg/m.   General: The  patient is well-developed and well-nourished and in no acute distress.   Neurologic Exam  Mental status: The patient is alert and oriented x 3 at the time of the examination. The patient has apparent normal recent and remote memory, with an apparently normal attention span and concentration ability.   Speech is normal.  Cranial nerves: Extraocular movements are full.   2+ right APD.   Reduced visual acuity and reduced color vision OD     Facial strength is normal. Trapezius strength is strong.  No obvious hearing deficits are noted.  Motor:  Muscle bulk is normal.   Left rapid altering movements are slow in the hand.  There is increased muscle tone in the left arm and both legs, left greater than right.  Strength is 5/5 in the right arm, 4+/5 in the left arm.  And 4- to 4/5 in the right leg and 4/5 in the left leg.  Sensory: reduced vibration sensation in the left leg but reduced touch/temp on the right.    Coordination: Cerebellar testing reveals mildly reduced left finger-nose-finger and mildly ataxic heel-to-shin bilaterally.  Gait and station: Needs to use arms to rise to stand.   He has a spastic gait requires support.  Unsteady turn in 8 small steps. [name]    He cannot tandem walk.   Romberg is borderline  Reflexes: Deep tendon reflexes are increased with spread at the knees.  He has nonsustained clonus at the ankles      ____________________________________________  Multiple sclerosis (HCC) - Plan: Ambulatory referral to Pain Clinic  Spastic gait  High risk medication use  Urinary hesitancy  Chronic low back pain, unspecified back pain laterality, unspecified whether sciatica present - Plan: Ambulatory referral to Pain Clinic  1.   Currently, he uses a walker but this is not meeting his mobility needs as he is not getting from room to fall and falls frequently.    He can not self propel a standard wheelchair well due to fatigue.  A scooter will not help enough to the turning  radius.  Therefore he needs an Mining engineer wheelchair.   Hand controls should be on the right.   He has the skill, cognition and motivation to operate an Mining engineer wheelchair.  He is able to transfer on/off.   2.   Continue Tysabri.  His MS has been stable on Tysabri with no recent exacerbation and no new lesions on MRI.  Unfortunately as he has a relasping form of SPMS, he is still slowly progressing, primarily worsening gait.   3.   He will continue Ampyra 10 mg twice daily and baclofen for strength/spasticity 4.   Continue gabapentin for dysesthesia and back pain.   Steroid pack and Add meloxicam 5.   Refer to pain clinic. Return in 6 months or sooner if new or worsening neurologic symptoms.    Kaysea Raya A. Epimenio Foot, MD, PhD 09/14/2020, 10:49 AM Certified in Neurology, Clinical Neurophysiology, Sleep Medicine, Pain Medicine and Neuroimaging  [facility] [address], [address] [place], [place] [address] [phone]

## 2020-09-16 ENCOUNTER — Telehealth: Payer: Self-pay | Admitting: *Deleted

## 2020-09-16 ENCOUNTER — Ambulatory Visit: Payer: Medicaid Other | Admitting: Physical Therapy

## 2020-09-16 NOTE — Telephone Encounter (Signed)
Faxed signed order back for motorized WC eval to [facility] w/ OV notes at [phone]. Received fax confirmation.

## 2020-09-17 ENCOUNTER — Other Ambulatory Visit: Payer: Self-pay | Admitting: Neurology

## 2020-09-17 DIAGNOSIS — E559 Vitamin D deficiency, unspecified: Secondary | ICD-10-CM

## 2020-09-21 ENCOUNTER — Ambulatory Visit (HOSPITAL_COMMUNITY): Payer: Medicaid Other | Admitting: Family

## 2020-09-21 ENCOUNTER — Encounter (HOSPITAL_COMMUNITY): Payer: Self-pay | Admitting: Family

## 2020-09-21 ENCOUNTER — Other Ambulatory Visit: Payer: Self-pay

## 2020-09-21 ENCOUNTER — Encounter: Payer: Medicaid Other | Admitting: Physical Therapy

## 2020-09-21 DIAGNOSIS — G4709 Other insomnia: Secondary | ICD-10-CM

## 2020-09-21 DIAGNOSIS — F411 Generalized anxiety disorder: Secondary | ICD-10-CM

## 2020-09-21 DIAGNOSIS — F319 Bipolar disorder, unspecified: Secondary | ICD-10-CM

## 2020-09-21 MED ORDER — ESCITALOPRAM OXALATE 10 MG PO TABS
10.0000 mg | ORAL_TABLET | Freq: Every day | ORAL | 1 refills | Status: DC
Start: 2020-09-21 — End: 2021-06-14

## 2020-09-21 MED ORDER — HYDROXYZINE HCL 50 MG PO TABS: 50.0000 mg | ORAL_TABLET | Freq: Three times a day (TID) | ORAL | 1 refills | Status: DC | PRN

## 2020-09-21 MED ORDER — GABAPENTIN 800 MG PO TABS: 800.0000 mg | ORAL_TABLET | Freq: Every day | ORAL | 2 refills | Status: DC

## 2020-09-21 MED ORDER — QUETIAPINE FUMARATE 100 MG PO TABS: 150.0000 mg | ORAL_TABLET | Freq: Every day | ORAL | 1 refills | Status: DC

## 2020-09-21 NOTE — Addendum Note (Signed)
Addended by: Oneta Rack on: 09/21/2020 11:52 AM   Modules accepted: Orders

## 2020-09-21 NOTE — Progress Notes (Addendum)
BH MD/PA/NP OP Progress Note  09/21/2020 10:40 AM Bradley Brandt  MRN:  [id number]    HPI:  Bradley Brandt 40 year old African American Male was seen and evaluated  face to face.  Patient presents to follow-up with medication refills.  Patient is currently prescribed Lexapro 10 mg gabapentin 600 mg hydroxyzine 50 mg and Seroquel 100 mg p.o. daily.  He reports taking and tolerating medications well however states.  Have been taking 150 mg of Seroquel to help with sleep.  He reports breaking the pills in half.  Reported taking hydroxyzine 3 to 4 tablets throughout the day.  Patient is unsure of milligrams.  Discussed taking medications as indicated.  Reported history with Bipolar, Depression and Anxiety Disorder.    [name] reports concerns with weight loss over the past 3 months.  As he reports he usually weighs 211 pounds and is currently at 180 pounds.  Reports an increased appetite and is unsure why he is losing weight.  Reports he currently followed by neurology for multiple sclerosis patient is requesting a referral for pain management.  As he reports multiple falls throughout the day.  Denied suicidal or homicidal ideations.  Denies auditory or visual hallucinations.  Discussed following up with primary care provider for chronic pain management referral.  NP will consult social worker for additional outpatient resources.  Janmichael denied illicit drug use.  Reports his anxiety is a 6 out of 10 with 10 being the worst rates his depression 8 out of 10 with 10 being the worst.  We will titrate gabapentin 600 mg to 800 mg p.o. twice daily.  Patient was receptive to plan.  Support, encouragement and reassurance was provided.   Visit Diagnosis: No diagnosis found.  Past Psychiatric History:   Past Medical History:  Past Medical History:  Diagnosis Date  . Bipolar 1 disorder (HCC)   . Bipolar disorder, unspecified (HCC) 01/03/2013  . MS (multiple sclerosis) (HCC) dx 2006   relapsing-remitting right  sided weakness  . Neuromuscular disorder [facility])    MS    Past Surgical History:  Procedure Laterality Date  . TUMOR REMOVAL  1983   abdomen benign    Family Psychiatric History:   Family History:  Family History  Adopted: Yes  Problem Relation Age of Onset  . Lupus Mother   . Ataxia Neg Hx   . Chorea Neg Hx   . Dementia Neg Hx   . Mental retardation Neg Hx   . Migraines Neg Hx   . Multiple sclerosis Neg Hx   . Neurofibromatosis Neg Hx   . Neuropathy Neg Hx   . Parkinsonism Neg Hx   . Seizures Neg Hx   . Stroke Neg Hx     Social History:  Social History   Socioeconomic History  . Marital status: Single    Spouse name: Not on file  . Number of children: Not on file  . Years of education: Not on file  . Highest education level: Not on file  Occupational History  . Not on file  Tobacco Use  . Smoking status: Former Smoker    Packs/day: 0.00  . Smokeless tobacco: Never Used  Vaping Use  . Vaping Use: Former  Substance and Sexual Activity  . Alcohol use: Yes    Comment: once a month  . Drug use: Yes    Frequency: 7.0 times per week    Types: Marijuana  . Sexual activity: Yes    Partners: Female  Other Topics Concern  .  Not on file  Social History Narrative  . Not on file   Social Determinants of Health   Financial Resource Strain: Not on file  Food Insecurity: Not on file  Transportation Needs: Not on file  Physical Activity: Not on file  Stress: Not on file  Social Connections: Not on file    Allergies:  Allergies  Allergen Reactions  . Lithium Rash    Metabolic Disorder Labs: No results found for: HGBA1C, MPG No results found for: PROLACTIN Lab Results  Component Value Date   CHOL 194 04/08/2020   TRIG 110 04/08/2020   HDL 38 (L) 04/08/2020   CHOLHDL 5.1 (H) 04/08/2020   LDLCALC 136 (H) 04/08/2020   No results found for: TSH  Therapeutic Level Labs: No results found for: LITHIUM No results found for: VALPROATE No components found  for:  CBMZ  Current Medications: Current Outpatient Medications  Medication Sig Dispense Refill  . acetaminophen (TYLENOL) 500 MG tablet Take 500 mg by mouth 2 (two) times daily.    . baclofen (LIORESAL) 20 MG tablet Take 1 tablet (20 mg total) by mouth 3 (three) times daily. 90 tablet 11  . dalfampridine 10 MG TB12 Take 1 tablet by mouth every 12 hours. 60 tablet 11  . escitalopram (LEXAPRO) 10 MG tablet Take 1 tablet (10 mg total) by mouth daily. 30 tablet 1  . gabapentin (NEURONTIN) 600 MG tablet Take 1 tablet (600 mg total) by mouth 3 (three) times daily. 90 tablet 11  . hydrOXYzine (ATARAX/VISTARIL) 50 MG tablet Take 1 tablet (50 mg total) by mouth 3 (three) times daily as needed. 90 tablet 1  . Meloxicam 15 MG TBDP Take 30 tablets by mouth daily. 30 tablet 3  . methylPREDNISolone (MEDROL DOSEPAK) 4 MG TBPK tablet 6 pills po x 1 day, then 5 pills po x 1 day, then 4 pills po x 1 day, then 3 pills po x 1 day, then 2 pills po x 1 day, then 1 pill po x 1 day 21 tablet 0  . natalizumab (TYSABRI) 300 MG/15ML injection Inject 15 mLs (300 mg total) into the vein every 28 (twenty-eight) days. 15 mL 5  . QUEtiapine (SEROQUEL) 100 MG tablet Take 1.5 tablets (150 mg total) by mouth at bedtime. 30 tablet 1  . tamsulosin (FLOMAX) 0.4 MG CAPS capsule Take 2 capsules (0.8 mg total) by mouth daily. 60 capsule 11  . Vitamin D, Ergocalciferol, (DRISDOL) 1.25 MG (50000 UNIT) CAPS capsule Take 1 capsule (50,000 Units total) by mouth every 7 (seven) days. 13 capsule 3   No current facility-administered medications for this visit.     Musculoskeletal: Strength & Muscle Tone: within normal limits Gait & Station: unsteady Patient leans: patient uses a cane for ambulation assistance  Psychiatric Specialty Exam: Review of Systems  Blood pressure 131/76, pulse 60, height 5\' 11"  (1.803 m), weight 185 lb (83.9 kg), SpO2 95 %.Body mass index is 25.8 kg/m.  General Appearance: Casual  Eye Contact:  Good   Speech:  Clear and Coherent  Volume:  Normal  Mood:  Anxious and Depressed  Affect:  Congruent  Thought Process:  Coherent  Orientation:  Full (Time, Place, and Person)  Thought Content: Logical   Suicidal Thoughts:  No  Homicidal Thoughts:  No  Memory:  Immediate;   Fair Recent;   Fair  Judgement:  Fair  Insight:  Fair  Psychomotor Activity:  Normal  Concentration:  Concentration: Fair  Recall:  of Knowledge: Fair  Language: Good  Akathisia:  No  Handed:  Right  AIMS (if indicated):  Assets:  Communication Skills Desire for Improvement Social Support Vocational/Educational  ADL's:  Intact  Cognition: WNL  Sleep:  Good   Screenings: GAD-7   Flowsheet Row Clinical Support from 09/21/2020 in [facility] Office Visit from 08/11/2020 in [facility] Office Visit from 06/09/2020 in [facility]  Total GAD-7 Score 17 21 19     PHQ2-9   Flowsheet Row Office Visit from 08/11/2020 in [facility] Counselor from 08/05/2020 in [facility] Office Visit from 06/09/2020 in [facility]  PHQ-2 Total Score 3 6 5   PHQ-9 Total Score 17 17 25     Flowsheet Row Office Visit from 08/11/2020 in [facility] Counselor from 08/05/2020 in [facility]  C-SSRS RISK CATEGORY Low Risk No Risk       Assessment and Plan:  Bipolar/depression disorder: Continue Seroquel 150 mg p.o. nightly Continue Lexapro 10 mg p.o. daily  Generalized anxiety disorder: Increased gabapentin 600 mg to 800 mg p.o. daily twice daily Continue hydroxyzine 50 mg p.o. 3 times daily as needed  Keep follow-up appointment with all outpatient providers -will consult regarding pain management referral   [facility], NP 09/21/2020, 10:40 AM

## 2020-09-22 ENCOUNTER — Encounter: Payer: Self-pay | Admitting: Nurse Practitioner

## 2020-09-22 ENCOUNTER — Ambulatory Visit (INDEPENDENT_AMBULATORY_CARE_PROVIDER_SITE_OTHER): Payer: Medicaid Other | Admitting: Nurse Practitioner

## 2020-09-22 VITALS — BP 118/64 | HR 47 | Ht 71.0 in | Wt 186.0 lb

## 2020-09-22 DIAGNOSIS — M25421 Effusion, right elbow: Secondary | ICD-10-CM

## 2020-09-22 DIAGNOSIS — M5441 Lumbago with sciatica, right side: Secondary | ICD-10-CM

## 2020-09-22 DIAGNOSIS — M25422 Effusion, left elbow: Secondary | ICD-10-CM

## 2020-09-22 DIAGNOSIS — G959 Disease of spinal cord, unspecified: Secondary | ICD-10-CM

## 2020-09-22 DIAGNOSIS — G8929 Other chronic pain: Secondary | ICD-10-CM

## 2020-09-22 DIAGNOSIS — G8191 Hemiplegia, unspecified affecting right dominant side: Secondary | ICD-10-CM

## 2020-09-22 DIAGNOSIS — F1920 Other psychoactive substance dependence, uncomplicated: Secondary | ICD-10-CM

## 2020-09-22 NOTE — Patient Instructions (Signed)
Chronic Back Pain When back pain lasts longer than 3 months, it is called chronic back pain. Pain may get worse at certain times (flare-ups). There are things you can do at home to manage your pain. Follow these instructions at home: Pay attention to any changes in your symptoms. Take these actions to help with your pain: Managing pain and stiffness  If told, put ice on the painful area. Your doctor may tell you to use ice for 24-48 hours after the flare-up starts. To do this: ? Put ice in a plastic bag. ? Place a towel between your skin and the bag. ? Leave the ice on for 20 minutes, 2-3 times a day.  If told, put heat on the painful area. Do this as often as told by your doctor. Use the heat source that your doctor recommends, such as a moist heat pack or a heating pad. ? Place a towel between your skin and the heat source. ? Leave the heat on for 20-30 minutes. ? Take off the heat if your skin turns bright red. This is especially important if you are unable to feel pain, heat, or cold. You may have a greater risk of getting burned.  Soak in a warm bath. This can help relieve pain.      Activity  Avoid bending and other activities that make pain worse.  When standing: ? Keep your upper back and neck straight. ? Keep your shoulders pulled back. ? Avoid slouching.  When sitting: ? Keep your back straight. ? Relax your shoulders. Do not round your shoulders or pull them backward.  Do not sit or stand in one place for long periods of time.  Take short rest breaks during the day. Lying down or standing is usually better than sitting. Resting can help relieve pain.  When sitting or lying down for a long time, do some mild activity or stretching. This will help to prevent stiffness and pain.  Get regular exercise. Ask your doctor what activities are safe for you.  Do not lift anything that is heavier than 10 lb (4.5 kg) or the limit that you are told, until your doctor says that it  is safe.  To prevent injury when you lift things: ? Bend your knees. ? Keep the weight close to your body. ? Avoid twisting.  Sleep on a firm mattress. Try lying on your side with your knees slightly bent. If you lie on your back, put a pillow under your knees.   Medicines  Treatment may include medicines for pain and swelling taken by mouth or put on the skin, prescription pain medicine, or muscle relaxants.  Take over-the-counter and prescription medicines only as told by your doctor.  Ask your doctor if the medicine prescribed to you: ? Requires you to avoid driving or using machinery. ? Can cause trouble pooping (constipation). You may need to take these actions to prevent or treat trouble pooping:  Drink enough fluid to keep your pee (urine) pale yellow.  Take over-the-counter or prescription medicines.  Eat foods that are high in fiber. These include beans, whole grains, and fresh fruits and vegetables.  Limit foods that are high in fat and sugars. These include fried or sweet foods. General instructions  Do not use any products that contain nicotine or tobacco, such as cigarettes, e-cigarettes, and chewing tobacco. If you need help quitting, ask your doctor.  Keep all follow-up visits as told by your doctor. This is important. Contact a doctor if:    Your pain does not get better with rest or medicine.  Your pain gets worse, or you have new pain.  You have a high fever.  You lose weight very quickly.  You have trouble doing your normal activities. Get help right away if:  One or both of your legs or feet feel weak.  One or both of your legs or feet lose feeling (have numbness).  You have trouble controlling when you poop (have a bowel movement) or pee (urinate).  You have bad back pain and: ? You feel like you may vomit (nauseous), or you vomit. ? You have pain in your belly (abdomen). ? You have shortness of breath. ? You faint. Summary  When back pain  lasts longer than 3 months, it is called chronic back pain.  Pain may get worse at certain times (flare-ups).  Use ice and heat as told by your doctor. Your doctor may tell you to use ice after flare-ups. This information is not intended to replace advice given to you by your health care provider. Make sure you discuss any questions you have with your health care provider. Document Revised: 07/16/2019 Document Reviewed: 07/16/2019 Elsevier Patient Education  2021 [employer].  

## 2020-09-22 NOTE — Progress Notes (Signed)
[facility] Patient [facility] [address], [place]  [address] Phone:  [phone]   Fax:  [phone]   Acute Office Visit  Subjective:    Patient ID: Bradley Brandt, male    DOB: 07-Apr-1981, 40 y.o.   MRN: [id number]  Chief Complaint  Patient presents with  . Back Pain  . Fall    HPI Patient shows up at the clinic without an appointment with back pain.  He  has a past medical history of Bipolar 1 disorder (HCC), Bipolar disorder, unspecified (HCC) (01/03/2013), MS (multiple sclerosis) (HCC) (dx 2006), and Neuromuscular disorder (HCC).   He is having 7/10 lower back pain.  He has pain that goes into right leg into his foot. He is having tingling. He is having weakness. He does have some problems with controlling bowel and bladder due to the pain and limitations with ambulation. He feels like the pain is getting worse. Walking makes the pain worse. He is unable to rest. He admits that he uses THC to help the pain. He is interested in having injections if this will help his pain.  He is currently in PT and this makes the pain worse.  However, he admits to continue. He has had multiple falls. He admits that he is getting help with on fall prevention etc.   He uses [employer] and [employer].  He is currently on baclofen 20 mg 3 times daily, gabapentin 600 mg 3 times daily.  For treatment of his multiple sclerosis he is taking dalfampridine 10 mg every 12 hours and Natalizumab 300 mg q. 28 days  He has not been seen by pain management. He has had another referral to [facility]. He admits that he was there before.   Past Medical History:  Diagnosis Date  . Bipolar 1 disorder (HCC)   . Bipolar disorder, unspecified (HCC) 01/03/2013  . MS (multiple sclerosis) (HCC) dx 2006   relapsing-remitting right sided weakness  . Neuromuscular disorder [facility])    MS    Past Surgical History:  Procedure Laterality Date  . TUMOR REMOVAL  1983   abdomen benign    Family History  Adopted: Yes   Problem Relation Age of Onset  . Lupus Mother   . Ataxia Neg Hx   . Chorea Neg Hx   . Dementia Neg Hx   . Mental retardation Neg Hx   . Migraines Neg Hx   . Multiple sclerosis Neg Hx   . Neurofibromatosis Neg Hx   . Neuropathy Neg Hx   . Parkinsonism Neg Hx   . Seizures Neg Hx   . Stroke Neg Hx     Social History   Socioeconomic History  . Marital status: Single    Spouse name: Not on file  . Number of children: Not on file  . Years of education: Not on file  . Highest education level: Not on file  Occupational History  . Not on file  Tobacco Use  . Smoking status: Former Smoker    Packs/day: 0.00  . Smokeless tobacco: Never Used  Vaping Use  . Vaping Use: Former  Substance and Sexual Activity  . Alcohol use: Yes    Comment: once a month  . Drug use: Yes    Frequency: 7.0 times per week    Types: Marijuana  . Sexual activity: Yes    Partners: Female  Other Topics Concern  . Not on file  Social History Narrative  . Not on file  Social Determinants of Health   Financial Resource Strain: Not on file  Food Insecurity: Not on file  Transportation Needs: Not on file  Physical Activity: Not on file  Stress: Not on file  Social Connections: Not on file  Intimate Partner Violence: Not on file    Outpatient Medications Prior to Visit  Medication Sig Dispense Refill  . acetaminophen (TYLENOL) 500 MG tablet Take 500 mg by mouth 2 (two) times daily.    . baclofen (LIORESAL) 20 MG tablet Take 1 tablet (20 mg total) by mouth 3 (three) times daily. 90 tablet 11  . dalfampridine 10 MG TB12 Take 1 tablet by mouth every 12 hours. 60 tablet 11  . escitalopram (LEXAPRO) 10 MG tablet Take 1 tablet (10 mg total) by mouth daily. 30 tablet 1  . gabapentin (NEURONTIN) 800 MG tablet Take 1 tablet (800 mg total) by mouth daily. 30 tablet 2  . hydrOXYzine (ATARAX/VISTARIL) 50 MG tablet Take 1 tablet (50 mg total) by mouth 3 (three) times daily as needed. 90 tablet 1  .  natalizumab (TYSABRI) 300 MG/15ML injection Inject 15 mLs (300 mg total) into the vein every 28 (twenty-eight) days. 15 mL 5  . QUEtiapine (SEROQUEL) 100 MG tablet Take 1.5 tablets (150 mg total) by mouth at bedtime. 30 tablet 1  . tamsulosin (FLOMAX) 0.4 MG CAPS capsule Take 2 capsules (0.8 mg total) by mouth daily. 60 capsule 11  . Vitamin D, Ergocalciferol, (DRISDOL) 1.25 MG (50000 UNIT) CAPS capsule Take 1 capsule (50,000 Units total) by mouth every 7 (seven) days. 13 capsule 3  . methylPREDNISolone (MEDROL DOSEPAK) 4 MG TBPK tablet 6 pills po x 1 day, then 5 pills po x 1 day, then 4 pills po x 1 day, then 3 pills po x 1 day, then 2 pills po x 1 day, then 1 pill po x 1 day 21 tablet 0  . Meloxicam 15 MG TBDP Take 30 tablets by mouth daily. 30 tablet 3   No facility-administered medications prior to visit.    Allergies  Allergen Reactions  . Lithium Rash    Review of Systems     Objective:    Physical Exam HENT:     Head: Normocephalic.     Nose: Nose normal.     Mouth/Throat:     Mouth: Mucous membranes are moist.  Cardiovascular:     Rate and Rhythm: Normal rate.     Pulses: Normal pulses.  Pulmonary:     Effort: Pulmonary effort is normal.  Musculoskeletal:     Right elbow: Swelling and effusion present. No lacerations. Normal range of motion.     Left elbow: Swelling and effusion present. No lacerations. Normal range of motion.     Cervical back: Normal range of motion.     Lumbar back: Tenderness present. No swelling or edema.     Comments: cane use  Skin:    General: Skin is warm and dry.  Neurological:     General: No focal deficit present.     Mental Status: He is alert and oriented to person, place, and time.  Psychiatric:        Mood and Affect: Mood normal.        Behavior: Behavior normal.        Thought Content: Thought content normal.        Judgment: Judgment normal.     BP 118/64   Pulse (!) 47   Ht 5\' 11"  (1.803 m)   Wt 186  lb (84.4 kg)   SpO2  100%   BMI 25.94 kg/m  Wt Readings from Last 3 Encounters:  09/22/20 186 lb (84.4 kg)  08/30/20 187 lb (84.8 kg)  07/26/20 200 lb (90.7 kg)    Health Maintenance Due  Topic Date Due  . TETANUS/TDAP  Never done    There are no preventive care reminders to display for this patient.   No results found for: TSH Lab Results  Component Value Date   WBC 8.7 05/26/2020   HGB 15.7 05/26/2020   HCT 46.1 05/26/2020   MCV 95 05/26/2020   PLT 213 05/26/2020   Lab Results  Component Value Date   NA 140 04/08/2020   K 4.5 04/08/2020   CO2 22 12/31/2017   GLUCOSE 91 04/08/2020   BUN 19 04/08/2020   CREATININE 1.05 (H) 04/08/2020   BILITOT 0.4 04/08/2020   ALKPHOS 79 04/08/2020   AST 19 04/08/2020   ALT 13 12/16/2015   PROT 7.0 04/08/2020   ALBUMIN 4.5 04/08/2020   CALCIUM 9.7 04/08/2020   ANIONGAP 14 02/09/2014   Lab Results  Component Value Date   CHOL 194 04/08/2020   Lab Results  Component Value Date   HDL 38 (L) 04/08/2020   Lab Results  Component Value Date   LDLCALC 136 (H) 04/08/2020   Lab Results  Component Value Date   TRIG 110 04/08/2020   Lab Results  Component Value Date   CHOLHDL 5.1 (H) 04/08/2020   No results found for: HGBA1C     Assessment & Plan:   Problem List Items Addressed This Visit      Nervous and Auditory   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HCC) Persistent we will continue to follow-up with neurology   Low back pain with sciatica - Primary Worsening We will continue with physical therapy Anticipating referral to pain management for assistance   Right hemiparesis (HCC) Persistent     Other   Polysubstance dependence (HCC) Marijuana use to help with pain management is not effective    Other Visit Diagnoses    Effusion of bursa of left elbow     Worsening Ace wrap applied Education provided   Relevant Orders   Apply wrap   Effusion of bursa of right elbow    Worsening Ace wrap applied Education provided   Relevant Orders    Apply wrap        No orders of the defined types were placed in this encounter.    Barbette Merino, NP

## 2020-09-23 ENCOUNTER — Ambulatory Visit: Payer: Medicaid Other | Admitting: Physical Therapy

## 2020-09-23 ENCOUNTER — Encounter (HOSPITAL_COMMUNITY): Payer: Medicaid Other | Admitting: Physician Assistant

## 2020-09-23 ENCOUNTER — Ambulatory Visit: Payer: Self-pay | Admitting: Nurse Practitioner

## 2020-09-23 ENCOUNTER — Encounter: Payer: Self-pay | Admitting: Nurse Practitioner

## 2020-09-27 ENCOUNTER — Ambulatory Visit (INDEPENDENT_AMBULATORY_CARE_PROVIDER_SITE_OTHER): Payer: Medicaid Other | Admitting: Licensed Clinical Social Worker

## 2020-09-27 ENCOUNTER — Other Ambulatory Visit: Payer: Self-pay

## 2020-09-27 DIAGNOSIS — F411 Generalized anxiety disorder: Secondary | ICD-10-CM

## 2020-09-28 ENCOUNTER — Encounter: Payer: Medicaid Other | Admitting: Physical Therapy

## 2020-09-29 NOTE — Progress Notes (Signed)
   THERAPIST PROGRESS NOTE  Session Time: 40 min.  Participation Level: Active  Behavioral Response: CasualAlertNegative and Anxious  Type of Therapy: Individual Therapy  Treatment Goals addressed: Anxiety and Coping  Interventions: Supportive and Other: Additional assessment  Summary: Bradley Brandt is a 40 y.o. male who presents with hx of GAD. Pt returns for in person session. This is first session since initial session. There is no odor of cannabis noted today. Pt advises his father who lives in [place] transported him to appt. Pt states his father reminded him of appt or he would have missed it. LCSW reviewed pt's stated goal of wanting to "be more independent and Brandt up". Pt confirms. Discussed starting with small obtainable goal of keeping up with his own appts/schedule using a calendar or his phone. Pt agrees he should be able to do this. LCSW assessed for symptoms of anx. Pt states he is continuing to feel anxious. He rates his anx as "8" on a 0-10 scale with 10 the worst. Reports he is taking as many as 4 hydroxyzine a day. Discussed using meds as prescribed and discussing with med provider. LCSW assessed for sources of anx. Pt states "My girlfriend" [name]). Pt advises his girlfriend is 12 and also on disability. He states she is trying to "push me back" yet reports he is with her daily. He states she is declining as much sex as he would prefer. Pt states crowds make him anxious yet he agrees he isolates and is rarely in a crowd. Pt is anx about wt loss. He states he used to weigh 220 and is now at 180. He is unable to say how long it took him to lose this much wt. He reports he has a good diet and cooks his own meals. Pt reports chronic back pain and says he is unable to get pain meds. LCSW provided education on climate r/t pain management and recommended getting referral from PCP for pain management clinic. LCSW assessed for pt's typical day. He states he gets up at 6a with no  alarm, works out, eats and goes back to bed. Spends time with [name] daily, she is able to drive. Pt denies having any hobbies or interests. Pt reports he has not seen his son since last appt. Reports son is with his bio mom. Pt cannot identify any primary coping strategy. He states he is not familiar with meditation. He agrees to try Daily Calm, which LCSW showed him, between now and next session. LCSW reviewed poc with pt's verbal agreement. Pt states appreciation for care.   Suicidal/Homicidal: Nowithout intent/plan  Therapist Response: Pt remains somewhat interested in care.  Plan: Return again in ~3 weeks.  Diagnosis: Axis I: Generalized Anxiety Disorder  Allen Sink, LCSW 09/29/2020

## 2020-09-30 ENCOUNTER — Encounter: Payer: Medicaid Other | Admitting: Physical Therapy

## 2020-10-12 ENCOUNTER — Other Ambulatory Visit: Payer: Self-pay

## 2020-10-12 ENCOUNTER — Ambulatory Visit: Payer: Medicaid Other | Attending: Nurse Practitioner

## 2020-10-12 DIAGNOSIS — G35 Multiple sclerosis: Secondary | ICD-10-CM | POA: Insufficient documentation

## 2020-10-12 DIAGNOSIS — M5441 Lumbago with sciatica, right side: Secondary | ICD-10-CM | POA: Insufficient documentation

## 2020-10-12 DIAGNOSIS — M25522 Pain in left elbow: Secondary | ICD-10-CM | POA: Insufficient documentation

## 2020-10-12 DIAGNOSIS — R296 Repeated falls: Secondary | ICD-10-CM | POA: Diagnosis present

## 2020-10-12 DIAGNOSIS — R600 Localized edema: Secondary | ICD-10-CM | POA: Insufficient documentation

## 2020-10-12 DIAGNOSIS — R262 Difficulty in walking, not elsewhere classified: Secondary | ICD-10-CM | POA: Insufficient documentation

## 2020-10-12 DIAGNOSIS — M6281 Muscle weakness (generalized): Secondary | ICD-10-CM | POA: Diagnosis present

## 2020-10-12 DIAGNOSIS — G8929 Other chronic pain: Secondary | ICD-10-CM | POA: Diagnosis present

## 2020-10-12 DIAGNOSIS — M25521 Pain in right elbow: Secondary | ICD-10-CM | POA: Insufficient documentation

## 2020-10-12 NOTE — Therapy (Signed)
[facility] [address] [place], [place], [address] Phone: [phone]   Fax:  [phone]  Physical Therapy Treatment/Re-certification  Patient Details  Name: Bradley Brandt MRN: [id number] Date of Birth: 04/12/1981 Referring Provider (PT): Marcie Mowers, NP   Encounter Date: 10/12/2020   PT End of Session - 10/12/20 1220    Visit Number 6    Number of Visits 18    Date for PT Re-Evaluation 11/27/20    Authorization Type [employer] MCD    Authorization - Number of Visits 27    PT Start Time 1147    PT Stop Time 1229    PT Time Calculation (min) 42 min    Equipment Utilized During Treatment Gait belt    Activity Tolerance Patient tolerated treatment well    Behavior During Therapy WFL for tasks assessed/performed           Past Medical History:  Diagnosis Date  . Bipolar 1 disorder (HCC)   . Bipolar disorder, unspecified (HCC) 01/03/2013  . MS (multiple sclerosis) (HCC) dx 2006   relapsing-remitting right sided weakness  . Neuromuscular disorder [facility])    MS    Past Surgical History:  Procedure Laterality Date  . TUMOR REMOVAL  1983   abdomen benign    There were no vitals filed for this visit.   Subjective Assessment - 10/12/20 1150    Subjective "I've been falling a million times." He reports falling about 3-4 times per day reporting that he looses his balance falls backwards. He reports his back pain has worsened and has an appointment scheduled with pain management. He cancelled previous visits due to not seeing any progress, but wants to continue with PT now to work on his core strength and balance.    Pertinent History MS, chronic pain history, falls, bipolar    Currently in Pain? Yes    Pain Score 6     Pain Location Back    Pain Orientation Lower    Pain Descriptors / Indicators Stabbing;Pins and needles    Pain Type Chronic pain    Pain Radiating Towards RLE down posterior leg and calf    Pain Onset More  than a month ago    Pain Frequency Constant    Aggravating Factors  standing, walking    Pain Relieving Factors sitting    Multiple Pain Sites Yes    Pain Score 10    Pain Location Elbow    Pain Orientation Left;Right    Pain Descriptors / Indicators Squeezing    Pain Type Chronic pain    Pain Onset More than a month ago    Pain Frequency Constant              [facility] PT Assessment - 10/12/20 0001      Precautions   Precautions Fall      Balance Screen   How many times? multiple falls daily      Observation/Other Assessments   Observations swelling present bilateral olecranon region      Posture/Postural Control   Posture Comments WBOS, excessive trunk extension in standing      AROM   Overall AROM Comments Bilateral elbow AROM WNL    Lumbar Flexion 50    Lumbar Extension 30    Lumbar - Right Side Bend [facility]    Lumbar - Left Side Bend [facility]    Lumbar - Right Rotation WFL   mild increased LBP   Lumbar - Left Rotation [facility]  mild increase LBP     Strength   Right Elbow Flexion 4/5    Right Elbow Extension 5/5    Left Elbow Flexion 5/5    Left Elbow Extension 5/5    Right Hip Flexion 4/5    Right Hip External Rotation  5/5    Right Hip Internal Rotation 5/5    Left Hip Flexion 4-/5    Left Hip External Rotation 5/5    Left Hip Internal Rotation 5/5    Right Knee Flexion 5/5    Right Knee Extension 4+/5    Left Knee Flexion 5/5    Left Knee Extension 4+/5    Right Ankle Dorsiflexion 4/5    Right Ankle Inversion 5/5    Right Ankle Eversion 5/5    Left Ankle Dorsiflexion 4/5    Left Ankle Inversion 5/5    Left Ankle Eversion 5/5      Palpation   Palpation comment TTP bilateral posterior elbow      Special Tests   Other special tests (-) SLR      Transfers   Five time sit to stand comments  15 sec      Ambulation/Gait   Gait Comments TUG 18 seconds with walking stick                         [facility] Adult PT Treatment/Exercise - 10/12/20 0001       Berg Balance Test   Sit to Stand Able to stand without using hands and stabilize independently    Standing Unsupported Able to stand safely 2 minutes    Sitting with Back Unsupported but Feet Supported on Floor or Stool Able to sit safely and securely 2 minutes    Stand to Sit Sits safely with minimal use of hands    Transfers Able to transfer safely, definite need of hands    Standing Unsupported with Eyes Closed Able to stand 10 seconds safely    Standing Ubsupported with Feet Together Needs help to attain position and unable to hold for 15 seconds    From Standing, Reach Forward with Outstretched Arm Can reach forward >12 cm safely (5")    From Standing Position, Pick up Object from Floor Able to pick up shoe safely and easily    From Standing Position, Turn to Look Behind Over each Shoulder Looks behind from both sides and weight shifts well    Turn 360 Degrees Able to turn 360 degrees safely but slowly    Standing Unsupported, Alternately Place Feet on Step/Stool Needs assistance to keep from falling or unable to try    Standing Unsupported, One Foot in [employer] Loses balance while stepping or standing    Standing on One Leg Tries to lift leg/unable to hold 3 seconds but remains standing independently    Total Score 37      Lumbar Exercises: Seated   Sit to Stand 5 reps      Lumbar Exercises: Supine   Bridge 10 reps    Bridge Limitations x2      Lumbar Exercises: Sidelying   Hip Abduction 10 reps    Hip Abduction Limitations bilateral                  PT Education - 10/12/20 1250    Education Details Education on re-assessment findings and POC moving forward. Use of modalities for pain reduction and swelling. Reviewed and updated HEP.    Person(s) Educated Patient  Methods Explanation;Demonstration;Verbal cues;Tactile cues;Handout    Comprehension Verbalized understanding;Returned demonstration;Verbal cues required;Tactile cues required;Need further instruction             PT Short Term Goals - 10/12/20 1256      PT SHORT TERM GOAL #1   Title Independent with initial HEP    Baseline verbal and tactle cues required    Time 3    Period Weeks    Status On-going    Target Date 09/07/20      PT SHORT TERM GOAL #2   Title Improve TUG time to <20 sec to work towards decreased fall risk    Baseline 18 sec    Time 3    Period Weeks    Status Achieved    Target Date 09/07/20      PT SHORT TERM GOAL #3   Title Assess Berg Balance by visit 3 to establish LTG for balance/decreasing fall risk    Baseline Berg 36    Time 2    Period Weeks    Status Achieved    Target Date 08/31/20             PT Long Term Goals - 10/12/20 1259      PT LONG TERM GOAL #1   Title Improve 5 times sit<>stand to 12 sec or less to improve ability for transfers and decreased fall risk    Baseline 15    Time 6    Period Weeks    Status On-going      PT LONG TERM GOAL #2   Title Increase left elbow extension strength to 5/5 to improve ability for using UE support for sit>stand transfers and ability for arm use for chores at home    Baseline 5/5    Time 6    Period Weeks    Status Achieved      PT LONG TERM GOAL #3   Title Tolerate sitting/standing periods at least 20-30 min with LBP/RLE pain 3/10 or less to improve positional tolerance and ability for chores and community ambulation for activities such as shopping    Baseline 6/10    Time 6    Period Weeks    Status On-going      PT LONG TERM GOAL #4   Title Patient will score at least 41 on the BERG balance assessment to signify improvements in balance and safety.    Baseline 37    Time 6    Period Weeks    Status On-going                 Plan - 10/12/20 1221    Clinical Impression Statement Patient returns to PT after 1 month lapse in care after cancelling appointments over the past month due to feeling that he was not making progress. Upon discussion today he wants to continue with PT to  work on his strength and balance as he continues to have multiple falls daily. He is also waiting for a wheelchair evaluation as this was recently ordered by his MD. He reports his back and elbow pain have remained unchanged over the past month with his back pain exacerbated by walking and standing and his elbow pain related to posterior falls where he lands directly onto his elbows. He has obvious swelling about bilateral posterior elbows, though has full ROM and good strength. He was recommended to utilize ice prn to assist in reducing swelling and pain. He demonstrates improvements in TUG and 5xSTS compared to baseline  and BERG balance remains relatively unchanged with patient having the most difficulty with narrow based balance activity and SLS. He demonstrates moderate improvements in BLE strength compared to baseline and his trunk ROM has improved with patient only reporting increased pain with rotation. He will benefit from consistent PT 2xweek for 6 weeks to address lingering strength, balance and ROM deficits in order to optimize his function and help to reduce his risk of future falls.    Personal Factors and Comorbidities Comorbidity 3+    Comorbidities MS, bipolar, chronic pain history    Examination-Activity Limitations Stand;Stairs;Squat;Lift;Bend;Sit;Transfers;Locomotion Level    Examination-Participation Restrictions Shop;Meal Prep;Community Activity;Laundry;Cleaning    Stability/Clinical Decision Making Evolving/Moderate complexity    Rehab Potential Fair    PT Frequency 2x / week    PT Duration 6 weeks    PT Treatment/Interventions Cryotherapy;ADLs/Self Care Home Management;Electrical Stimulation;Iontophoresis 4mg /ml Dexamethasone;Ultrasound;Therapeutic exercise;Patient/family education;Manual techniques;Therapeutic activities;Functional mobility training;Stair training;Gait training;Balance training;Neuromuscular re-education;Dry needling;Taping;Traction    PT Next Visit Plan continue  balance/gait and core work, lumbar exercises (potential flexion bias) as tolerated    PT Home Exercise Plan Access Code [email or web address]    Consulted and Agree with Plan of Care Patient           Patient will benefit from skilled therapeutic intervention in order to improve the following deficits and impairments:  Pain,Impaired flexibility,Decreased strength,Decreased activity tolerance,Increased edema,Decreased range of motion,Impaired tone,Abnormal gait,Decreased balance,Difficulty walking,Impaired sensation  Visit Diagnosis: Chronic right-sided low back pain with right-sided sciatica  Pain in left elbow  Pain in right elbow  Repeated falls  Multiple sclerosis (HCC)  Muscle weakness (generalized)  Difficulty in walking, not elsewhere classified  Localized edema     Problem List Patient Active Problem List   Diagnosis Date Noted  . Other insomnia 08/11/2020  . Generalized anxiety disorder 08/11/2020  . 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HCC) 12/31/2017  . Optic neuritis 08/02/2017  . High risk medication use 08/02/2017  . Vitamin D deficiency 09/19/2016  . Hypercholesterolemia 09/19/2016  . Numbness 12/16/2015  . Spastic gait 12/16/2015  . Urinary hesitancy 12/16/2015  . Low back pain with sciatica 09/28/2014  . Polysubstance dependence (HCC) 02/09/2014  . 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 Relapsing remitting multiple sclerosis (HCC) 05/28/2013  . Elevated BP 01/06/2013  . Bipolar disorder, unspecified (HCC) 01/03/2013  . Bipolar disorder (HCC) 01/03/2013  . Right hemiparesis (HCC) 01/02/2013  . Multiple sclerosis flare 01/02/2013  . Hemiplegia (HCC) 01/02/2013  Check all possible CPT codes: 01/04/2013- Therapeutic Exercise, [phone]- Neuro Re-education, [phone] - Gait Training, [phone] - Manual Therapy, [phone] - Therapeutic Activities, [phone] - Self Care, [phone] - Mechanical traction, 97014 - Electrical stimulation (unattended), 73710 - Iontophoresis and 97035 -  Ultrasound         [id number], PT, DPT, ATC 10/12/20 2:32 PM [facility] [address] [place], [place], [place] Phone: [phone]   Fax:  [phone]  Name: Bradley Brandt MRN: [id number] Date of Birth: 08/07/1980

## 2020-10-13 DIAGNOSIS — F411 Generalized anxiety disorder: Secondary | ICD-10-CM | POA: Diagnosis not present

## 2020-10-13 MED ORDER — METHYLPREDNISOLONE 4 MG PO TBPK: ORAL_TABLET | ORAL | 0 refills | Status: DC

## 2020-10-14 ENCOUNTER — Telehealth: Payer: Self-pay

## 2020-10-14 ENCOUNTER — Non-Acute Institutional Stay (HOSPITAL_COMMUNITY)
Admission: RE | Admit: 2020-10-14 | Discharge: 2020-10-14 | Disposition: A | Discharge status: Home / Self Care | Payer: Medicaid Other | Source: Ambulatory Visit | Attending: Internal Medicine | Admitting: Internal Medicine

## 2020-10-14 ENCOUNTER — Other Ambulatory Visit: Payer: Self-pay

## 2020-10-14 DIAGNOSIS — G35 Multiple sclerosis: Secondary | ICD-10-CM | POA: Diagnosis not present

## 2020-10-14 MED ORDER — SODIUM CHLORIDE 0.9 % IV SOLN
INTRAVENOUS | Status: DC | PRN
  Administered 2020-10-14: 250 mL via INTRAVENOUS

## 2020-10-14 MED ORDER — SODIUM CHLORIDE 0.9 % IV SOLN
300.0000 mg | INTRAVENOUS | Status: DC
  Administered 2020-10-14: 300 mg via INTRAVENOUS
  Filled 2020-10-14: qty 15

## 2020-10-14 NOTE — Progress Notes (Signed)
CENTER NOTE   Diagnosis: Multiple Sclerosis       Provider: Despina Arias, MD     Procedure: Donnamarie Poag IV     Note: Patient received Tysabri infusion via PIV. No premeds required per orders.  Tolerated infusion well with no adverse reaction. Vital signs stable. Discharge instructions given. Patient declined to stay for the 1 hour post-infusion observation. Instructed pt to make next infusion appointment for 4 weeks with scheduler, verbalized understanding.  Alert, oriented and ambulatory at discharge.

## 2020-10-14 NOTE — Discharge Instructions (Signed)
Natalizumab injection What is this medicine? NATALIZUMAB (na ta LIZ you mab) is used to treat relapsing multiple sclerosis. This drug is not a cure. It is also used to treat Crohn's disease. This medicine may be used for other purposes; ask your health care provider or pharmacist if you have questions. COMMON BRAND NAME(S): Tysabri What should I tell my health care provider before I take this medicine? They need to know if you have any of these conditions:  immune system problems  progressive multifocal leukoencephalopathy (PML)  an unusual or allergic reaction to natalizumab, other medicines, foods, dyes, or preservatives  pregnant or trying to get pregnant  breast-feeding How should I use this medicine? This medicine is for infusion into a vein. It is given by a health care professional in a hospital or clinic setting. A special MedGuide will be given to you by the pharmacist with each prescription and refill. Be sure to read this information carefully each time. Talk to your pediatrician regarding the use of this medicine in children. This medicine is not approved for use in children. Overdosage: If you think you have taken too much of this medicine contact a poison control center or emergency room at once. NOTE: This medicine is only for you. Do not share this medicine with others. What if I miss a dose? It is important not to miss your dose. Call your doctor or health care professional if you are unable to keep an appointment. What may interact with this medicine? Do not take this medicine with any of the following medications:  biologic medicines such as adalimumab, certolizumab, etanercept, golimumab, infliximab This medicine may also interact with the following medications:  azathioprine  cyclosporine  interferons  6-mercaptopurine  methotrexate  other medicines that lower your chance of fighting an infection  steroid medicines like prednisone or  cortisone  vaccines This list may not describe all possible interactions. Give your health care provider a list of all the medicines, herbs, non-prescription drugs, or dietary supplements you use. Also tell them if you smoke, drink alcohol, or use illegal drugs. Some items may interact with your medicine. What should I watch for while using this medicine? Your condition will be monitored carefully while you are receiving this medicine. Visit your doctor for regular check ups. Tell your doctor or healthcare professional if your symptoms do not start to get better or if they get worse. Stay away from people who are sick. Call your doctor or health care professional for advice if you get a fever, chills or sore throat, or other symptoms of a cold or flu. Do not treat yourself. In some patients, this medicine may cause a serious brain infection that may cause death. If you have any problems seeing, thinking, speaking, walking, or standing, tell your doctor right away. If you cannot reach your doctor, get urgent medical care. What side effects may I notice from receiving this medicine? Side effects that you should report to your doctor or health care professional as soon as possible:  allergic reactions like skin rash, itching or hives, swelling of the face, lips, or tongue  breathing problems  changes in vision  chest pain  confusion  depressed mood  dizziness  feeling faint; lightheaded; falls  general ill feeling or flu-like symptoms  loss of memory  missed menstrual periods  muscle weakness  problems with balance, talking, or walking  signs and symptoms of liver injury like dark yellow or brown urine; general ill feeling or flu-like symptoms; light-colored   stools; loss of appetite; nausea; right upper belly pain; unusually weak or tired; yellowing of the eyes or skin  suicidal thoughts, mood changes  unusual bruising or bleeding  unusually weak or tired Side effects that  usually do not require medical attention (report to your doctor or health care professional if they continue or are bothersome):  headache  joint pain  muscle cramps  muscle pain  nausea, vomiting  pain, redness, or irritation at site where injected  tiredness This list may not describe all possible side effects. Call your doctor for medical advice about side effects. You may report side effects to FDA at 1-800-FDA-1088. Where should I keep my medicine? This drug is given in a hospital or clinic and will not be stored at home. NOTE: This sheet is a summary. It may not cover all possible information. If you have questions about this medicine, talk to your doctor, pharmacist, or health care provider.  2021 Elsevier/Gold Standard (2018-12-09 13:20:26)  

## 2020-10-14 NOTE — Telephone Encounter (Signed)
Forwarding

## 2020-10-14 NOTE — Telephone Encounter (Signed)
Pt wanted to let provider that he will be going to physiatrist  appt instead of behavioral appt

## 2020-10-18 ENCOUNTER — Ambulatory Visit (INDEPENDENT_AMBULATORY_CARE_PROVIDER_SITE_OTHER): Payer: Medicaid Other | Admitting: Licensed Clinical Social Worker

## 2020-10-18 ENCOUNTER — Other Ambulatory Visit: Payer: Self-pay

## 2020-10-18 DIAGNOSIS — F411 Generalized anxiety disorder: Secondary | ICD-10-CM

## 2020-10-19 ENCOUNTER — Telehealth: Payer: Self-pay | Admitting: Physical Therapy

## 2020-10-19 ENCOUNTER — Ambulatory Visit: Payer: Medicaid Other | Attending: Nurse Practitioner | Admitting: Physical Therapy

## 2020-10-19 DIAGNOSIS — M25522 Pain in left elbow: Secondary | ICD-10-CM | POA: Insufficient documentation

## 2020-10-19 DIAGNOSIS — M25521 Pain in right elbow: Secondary | ICD-10-CM | POA: Insufficient documentation

## 2020-10-19 DIAGNOSIS — M5441 Lumbago with sciatica, right side: Secondary | ICD-10-CM | POA: Insufficient documentation

## 2020-10-19 DIAGNOSIS — G8929 Other chronic pain: Secondary | ICD-10-CM | POA: Insufficient documentation

## 2020-10-19 DIAGNOSIS — R296 Repeated falls: Secondary | ICD-10-CM | POA: Insufficient documentation

## 2020-10-19 DIAGNOSIS — G35 Multiple sclerosis: Secondary | ICD-10-CM | POA: Insufficient documentation

## 2020-10-19 NOTE — Telephone Encounter (Signed)
Talked to pt.  Pt reports he did not have transportation for appt today.  Reminded of next appt and attendance policy.

## 2020-10-20 NOTE — Progress Notes (Signed)
   THERAPIST PROGRESS NOTE  Session Time: 40 min  Participation Level: Minimal  Behavioral Response: CasualLethargicAnxious  Type of Therapy: Individual Therapy  Treatment Goals addressed: Anxiety and Coping  Interventions: Supportive  Summary: Bradley Brandt is a 40 y.o. male who presents with hx of GAD. This date pt returns for in person session. He states his sister brought him and lives next door to him. When asked pt states he is the one who kept up with his appt, father did not remind him. He does get auto reminder from [facility] but reports he is following up on suggestion to keep calendar on phone. This date pt states his anx is "6" on a 0-10 scale with 10 the worst. He reports he is taking meds as prescribed. Pt reports he thinks he and his girlfriend "are about to break up". He reports this is her choice not his. He provides some details r/t the communication in the relationship. He agrees [name] is belittling to him and puts him down frequently. LCSW assessed for friendships/other supports beyond parents. Pt states he does not have any friends. He agrees to review the support offered at the [facility] society. Education provided on [facility]. Assessed for pt trying out Daily Calm. Pt did not recall this. Provided education again and he states he will try to locate free version which did not instantly come up on his phone in session. LCSW provided education on nutritional supplements to try r/t pt's expression of wt loss concerns last session. LCSW reviewed poc including scheduling prior to close of session. Pt states appreciation for care.     Suicidal/Homicidal: Nowithout intent/plan  Therapist Response: Pt continues to come to sessions, minimal engagement. Review self talk next session.  Plan: Return again in 4 weeks.  Diagnosis: Axis I: Generalized Anxiety Disorder  [place], LCSW 10/20/2020

## 2020-10-21 ENCOUNTER — Telehealth: Payer: Self-pay | Admitting: Physical Therapy

## 2020-10-21 ENCOUNTER — Ambulatory Visit: Payer: Medicaid Other | Admitting: Physical Therapy

## 2020-10-21 NOTE — Telephone Encounter (Signed)
Called pt d/t missed appt.  Reminded of attendance policy and that we would need to only schedule one visit at a time moving forward.  He confirms understanding.  All but next appt will be canceled.  Reminded pt of next appt time and he confirmed he would attend.

## 2020-10-26 ENCOUNTER — Encounter: Payer: Medicaid Other | Admitting: Physical Therapy

## 2020-10-26 ENCOUNTER — Telehealth: Payer: Self-pay | Admitting: Neurology

## 2020-10-26 DIAGNOSIS — G8191 Hemiplegia, unspecified affecting right dominant side: Secondary | ICD-10-CM

## 2020-10-26 DIAGNOSIS — G35 Multiple sclerosis: Secondary | ICD-10-CM

## 2020-10-26 DIAGNOSIS — R261 Paralytic gait: Secondary | ICD-10-CM

## 2020-10-26 DIAGNOSIS — M545 Low back pain, unspecified: Secondary | ICD-10-CM

## 2020-10-26 DIAGNOSIS — Z79899 Other long term (current) drug therapy: Secondary | ICD-10-CM

## 2020-10-26 DIAGNOSIS — R200 Anesthesia of skin: Secondary | ICD-10-CM

## 2020-10-26 DIAGNOSIS — G8929 Other chronic pain: Secondary | ICD-10-CM

## 2020-10-26 DIAGNOSIS — R3911 Hesitancy of micturition: Secondary | ICD-10-CM

## 2020-10-26 NOTE — Telephone Encounter (Signed)
Tried calling father back. Wanting to know which Home Health agency they want [place] to place order to. Asked her call office back to provide this.

## 2020-10-26 NOTE — Telephone Encounter (Signed)
Pt's father(on DPR) has called to inform that since pt's insurance has changed to [facility] they will need an order from Dr so pt can continue his Home Health Care. Pt's father is not expecting a call back but if needed he Is open to a call back from [employer]

## 2020-10-26 NOTE — Telephone Encounter (Addendum)
Called and spoke with father. He states son has case Production designer, theatre/television/film, Laurena Bering w/ [employer]. Requesting order below. Should be reaching out to our office. Phone#[phone]. He requested I call Clinten for further info on Agency.  I called patient. He states he uses [facility]. Address: [address] [place], [place], [place] [address] Phone: [phone]. Fax: [phone]. Advised we will place new order for him. He will need to provide them his new insurance info. He verbalized understanding. I placed referral.

## 2020-10-26 NOTE — Addendum Note (Signed)
Addended by: Arther Abbott on: 10/26/2020 02:18 PM   Modules accepted: Orders

## 2020-10-27 NOTE — Telephone Encounter (Signed)
Faxed completed/signed form back to [facility] re: request for independent assessment for personal care services attestation of medical need. Fax: [phone]. Received fax confirmation.

## 2020-10-28 ENCOUNTER — Other Ambulatory Visit: Payer: Self-pay

## 2020-10-28 ENCOUNTER — Encounter: Payer: Self-pay | Admitting: Physical Therapy

## 2020-10-28 ENCOUNTER — Ambulatory Visit: Payer: Medicaid Other | Admitting: Physical Therapy

## 2020-10-28 DIAGNOSIS — G8929 Other chronic pain: Secondary | ICD-10-CM | POA: Diagnosis present

## 2020-10-28 DIAGNOSIS — M25522 Pain in left elbow: Secondary | ICD-10-CM | POA: Diagnosis present

## 2020-10-28 DIAGNOSIS — R296 Repeated falls: Secondary | ICD-10-CM

## 2020-10-28 DIAGNOSIS — G35 Multiple sclerosis: Secondary | ICD-10-CM | POA: Diagnosis present

## 2020-10-28 DIAGNOSIS — M5441 Lumbago with sciatica, right side: Secondary | ICD-10-CM | POA: Diagnosis present

## 2020-10-28 DIAGNOSIS — M25521 Pain in right elbow: Secondary | ICD-10-CM

## 2020-10-28 NOTE — Therapy (Signed)
[facility] [address] [place], [place], [address] Phone: [phone]   Fax:  [phone]  Physical Therapy Treatment  Patient Details  Name: Bradley Brandt MRN: [id number] Date of Birth: [birth year] Referring Provider (PT): Marcie Mowers, NP   Encounter Date: 10/28/2020   PT End of Session - 10/28/20 1047    Visit Number 7    Number of Visits 18    Date for PT Re-Evaluation 11/27/20    Authorization Type U[employer] MCD    Authorization - Number of Visits 27    PT Start Time 1047    PT Stop Time 1130    PT Time Calculation (min) 43 min    Equipment Utilized During Treatment Gait belt    Activity Tolerance Patient tolerated treatment well    Behavior During Therapy WFL for tasks assessed/performed           Past Medical History:  Diagnosis Date  . Bipolar 1 disorder (HCC)   . Bipolar disorder, unspecified (HCC) 01/03/2013  . MS (multiple sclerosis) (HCC) dx 2006   relapsing-remitting right sided weakness  . Neuromuscular disorder [facility])    MS    Past Surgical History:  Procedure Laterality Date  . TUMOR REMOVAL  1983   abdomen benign    There were no vitals filed for this visit.   Subjective Assessment - 10/28/20 1047    Subjective Pt reports that things have been going really well.  He reports that he has not had any falls recently.  He reports he will be going to pain management june 6th.  He continues to endorse low back pain which "does not change".    Pertinent History MS, chronic pain history, falls, bipolar    Patient Stated Goals Get back and elbow better    Currently in Pain? Yes    Pain Score 6     Pain Location Back    Pain Orientation Lower    Pain Radiating Towards --    Aggravating Factors  --    Pain Relieving Factors --    Effect of Pain on Daily Activities --              [facility] PT Assessment - 10/28/20 0001      Observation/Other Assessments   Observations --      AROM   Cervical  Flexion --    Cervical Extension --    Cervical - Right Side Bend --    Cervical - Left Side Bend --    Cervical - Right Rotation --    Cervical - Left Rotation --                         [facility] Adult PT Treatment/Exercise - 10/28/20 0001      Neuro Re-ed    Neuro Re-ed Details  rocker board in // (sagital plane) heel toe rocking on airex in //      Lumbar Exercises: Stretches   Active Hamstring Stretch Limitations repeated HS stretch in standing 2x10 in //      Lumbar Exercises: Aerobic   Nustep 5' level 5      Lumbar Exercises: Standing   Shoulder Extension Limitations with Black TB anchored posterior to patient 4x10      Lumbar Exercises: Seated   Sit to Stand Limitations 2x10 reps      Knee/Hip Exercises: Standing   Forward Step Up Limitations 5'' step - 10x ea  PT Short Term Goals - 10/12/20 1256      PT SHORT TERM GOAL #1   Title Independent with initial HEP    Baseline verbal and tactle cues required    Time 3    Period Weeks    Status On-going    Target Date 09/07/20      PT SHORT TERM GOAL #2   Title Improve TUG time to <20 sec to work towards decreased fall risk    Baseline 18 sec    Time 3    Period Weeks    Status Achieved    Target Date 09/07/20      PT SHORT TERM GOAL #3   Title Assess Berg Balance by visit 3 to establish LTG for balance/decreasing fall risk    Baseline Berg 36    Time 2    Period Weeks    Status Achieved    Target Date 08/31/20             PT Long Term Goals - 10/12/20 1259      PT LONG TERM GOAL #1   Title Improve 5 times sit<>stand to 12 sec or less to improve ability for transfers and decreased fall risk    Baseline 15    Time 6    Period Weeks    Status On-going      PT LONG TERM GOAL #2   Title Increase left elbow extension strength to 5/5 to improve ability for using UE support for sit>stand transfers and ability for arm use for chores at home    Baseline 5/5     Time 6    Period Weeks    Status Achieved      PT LONG TERM GOAL #3   Title Tolerate sitting/standing periods at least 20-30 min with LBP/RLE pain 3/10 or less to improve positional tolerance and ability for chores and community ambulation for activities such as shopping    Baseline 6/10    Time 6    Period Weeks    Status On-going      PT LONG TERM GOAL #4   Title Patient will score at least 41 on the BERG balance assessment to signify improvements in balance and safety.    Baseline 37    Time 6    Period Weeks    Status On-going                 Plan - 10/28/20 1303    Clinical Impression Statement Pt tolerates session well.  He demonstrates wide BOS during abulation with posterior lean.  We concentrated on standing exercise that enouraged stability and/or strenghening with flexion bias.  Pt must be heavily cued for anterior weight shift during rock board exercises but does improve hip strategy with practice.  Will add weighted walking with posterior pull next visit.    Personal Factors and Comorbidities Comorbidity 3+    Comorbidities MS, bipolar, chronic pain history    Examination-Activity Limitations Stand;Stairs;Squat;Lift;Bend;Sit;Transfers;Locomotion Level    Examination-Participation Restrictions Shop;Meal Prep;Community Activity;Laundry;Cleaning    Stability/Clinical Decision Making Evolving/Moderate complexity    Rehab Potential Fair    PT Frequency 2x / week    PT Duration 6 weeks    PT Treatment/Interventions Cryotherapy;ADLs/Self Care Home Management;Electrical Stimulation;Iontophoresis 4mg /ml Dexamethasone;Ultrasound;Therapeutic exercise;Patient/family education;Manual techniques;Therapeutic activities;Functional mobility training;Stair training;Gait training;Balance training;Neuromuscular re-education;Dry needling;Taping;Traction    PT Next Visit Plan continue balance/gait and core work, lumbar exercises (potential flexion bias) as tolerated    PT Home Exercise  Plan Access  Code [email or web address]    Consulted and Agree with Plan of Care Patient           Patient will benefit from skilled therapeutic intervention in order to improve the following deficits and impairments:  Pain,Impaired flexibility,Decreased strength,Decreased activity tolerance,Increased edema,Decreased range of motion,Impaired tone,Abnormal gait,Decreased balance,Difficulty walking,Impaired sensation  Visit Diagnosis: Chronic right-sided low back pain with right-sided sciatica  Pain in left elbow  Pain in right elbow  Repeated falls     Problem List Patient Active Problem List   Diagnosis Date Noted  . Other insomnia 08/11/2020  . Generalized anxiety disorder 08/11/2020  . 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HCC) 12/31/2017  . Optic neuritis 08/02/2017  . High risk medication use 08/02/2017  . Vitamin D deficiency 09/19/2016  . Hypercholesterolemia 09/19/2016  . Numbness 12/16/2015  . Spastic gait 12/16/2015  . Urinary hesitancy 12/16/2015  . Low back pain with sciatica 09/28/2014  . Polysubstance dependence (HCC) 02/09/2014  . 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 Relapsing remitting multiple sclerosis (HCC) 05/28/2013  . Elevated BP 01/06/2013  . Bipolar disorder, unspecified (HCC) 01/03/2013  . Bipolar disorder (HCC) 01/03/2013  . Right hemiparesis (HCC) 01/02/2013  . Multiple sclerosis flare 01/02/2013  . Hemiplegia (HCC) 01/02/2013    Fredderick Phenix 10/28/2020, 1:12 PM  [facility] [address] [place], [place], [address] Phone: [phone]   Fax:  [phone]  Name: Bradley Brandt MRN: [id number] Date of Birth: Dec 25, 1980

## 2020-11-02 ENCOUNTER — Encounter: Payer: Medicaid Other | Admitting: Physical Therapy

## 2020-11-03 ENCOUNTER — Ambulatory Visit: Payer: Medicaid Other | Admitting: Physical Therapy

## 2020-11-03 ENCOUNTER — Encounter: Payer: Self-pay | Admitting: Physical Therapy

## 2020-11-03 ENCOUNTER — Other Ambulatory Visit: Payer: Self-pay

## 2020-11-03 DIAGNOSIS — G8929 Other chronic pain: Secondary | ICD-10-CM

## 2020-11-03 DIAGNOSIS — R296 Repeated falls: Secondary | ICD-10-CM

## 2020-11-03 DIAGNOSIS — M5441 Lumbago with sciatica, right side: Secondary | ICD-10-CM | POA: Diagnosis not present

## 2020-11-03 DIAGNOSIS — M25521 Pain in right elbow: Secondary | ICD-10-CM

## 2020-11-03 DIAGNOSIS — M25522 Pain in left elbow: Secondary | ICD-10-CM

## 2020-11-03 DIAGNOSIS — G35 Multiple sclerosis: Secondary | ICD-10-CM

## 2020-11-03 NOTE — Therapy (Addendum)
[place], [place], [address] Phone: [phone]   Fax:  [phone]  Physical Therapy Treatment/ Discharge  Patient Details  Name: Bradley Brandt MRN: [id number] Date of Birth: Aug 15, 1980 Referring Provider (PT): Arletta Bale, NP   Encounter Date: 11/03/2020   PT End of Session - 11/03/20 1132    Visit Number 8    Number of Visits 18    Date for PT Re-Evaluation 11/27/20    Authorization Type UHC MCD    Authorization - Number of Visits 27    PT Start Time 1130    PT Stop Time 1214    PT Time Calculation (min) 44 min    Equipment Utilized During Treatment Gait belt    Activity Tolerance Patient tolerated treatment well    Behavior During Therapy WFL for tasks assessed/performed           Past Medical History:  Diagnosis Date  . Bipolar 1 disorder ([place])   . Bipolar disorder, unspecified ([place]) 01/03/2013  . MS (multiple sclerosis) ([place]) dx 2006   relapsing-remitting right sided weakness  . Neuromuscular disorder [facility])    MS    Past Surgical History:  Procedure Laterality Date  . TUMOR REMOVAL  1983   abdomen benign    There were no vitals filed for this visit.   Subjective Assessment - 11/03/20 1132    Subjective Pt reports that he has been doing well.  he reports no new falls.  he has been working on an anterior weight shift which he reports helps reduce his falls    Pertinent History MS, chronic pain history, falls, bipolar    Patient Stated Goals Get back and elbow better    Currently in Pain? Yes    Pain Score 9     Pain Location Back    Pain Orientation Lower                             [facility] Adult PT Treatment/Exercise - 11/03/20 0001      Lumbar Exercises: Stretches   Single Knee to Chest Stretch Limitations 2x1' ea, opposite leg straight    Lower Trunk Rotation Limitations 20x    Pelvic Tilt Limitations 5'' x10      Lumbar Exercises: Supine   Dead Bug  Limitations 3x10    Straight Leg Raises Limitations alternating SLR with foam roller under ankles - 3x10      Lumbar Exercises: Sidelying   Other Sidelying Lumbar Exercises side plank from knees 10''x5 ea      Lumbar Exercises: Quadruped   Plank 10'' x5                    PT Short Term Goals - 10/12/20 1256      PT SHORT TERM GOAL #1   Title Independent with initial HEP    Baseline verbal and tactle cues required    Time 3    Period Weeks    Status On-going    Target Date 09/07/20      PT SHORT TERM GOAL #2   Title Improve TUG time to <20 sec to work towards decreased fall risk    Baseline 18 sec    Time 3    Period Weeks    Status Achieved    Target Date 09/07/20      PT SHORT TERM GOAL #3   Title Assess Berg Balance by visit  3 to establish LTG for balance/decreasing fall risk    Baseline Berg 36    Time 2    Period Weeks    Status Achieved    Target Date 08/31/20             PT Long Term Goals - 10/12/20 1259      PT LONG TERM GOAL #1   Title Improve 5 times sit<>stand to 12 sec or less to improve ability for transfers and decreased fall risk    Baseline 15    Time 6    Period Weeks    Status On-going      PT LONG TERM GOAL #2   Title Increase left elbow extension strength to 5/5 to improve ability for using UE support for sit>stand transfers and ability for arm use for chores at home    Baseline 5/5    Time 6    Period Weeks    Status Achieved      PT LONG TERM GOAL #3   Title Tolerate sitting/standing periods at least 20-30 min with LBP/RLE pain 3/10 or less to improve positional tolerance and ability for chores and community ambulation for activities such as shopping    Baseline 6/10    Time 6    Period Weeks    Status On-going      PT LONG TERM GOAL #4   Title Patient will score at least 41 on the BERG balance assessment to signify improvements in balance and safety.    Baseline 37    Time 6    Period Weeks    Status On-going                  Plan - 11/03/20 1154    Clinical Impression Statement Pt is progressing well with therapy.   Today we focused on core strengthening (primarily flexors) to improve anterior weight shift.  Pt cued for form and pacing to maximize targeted muscle contraction.  No adverse events.  Pt fatigued at end of session.  montiored for over exertion while avoiding overheating.    Personal Factors and Comorbidities Comorbidity 3+    Comorbidities MS, bipolar, chronic pain history    Examination-Activity Limitations Stand;Stairs;Squat;Lift;Bend;Sit;Transfers;Locomotion Level    Examination-Participation Restrictions Shop;Meal Prep;Community Activity;Laundry;Cleaning    Stability/Clinical Decision Making Evolving/Moderate complexity    Rehab Potential Fair    PT Frequency 2x / week    PT Duration 6 weeks    PT Treatment/Interventions Cryotherapy;ADLs/Self Care Home Management;Electrical Stimulation;Iontophoresis 22m/ml Dexamethasone;Ultrasound;Therapeutic exercise;Patient/family education;Manual techniques;Therapeutic activities;Functional mobility training;Stair training;Gait training;Balance training;Neuromuscular re-education;Dry needling;Taping;Traction    PT Next Visit Plan continue balance/gait and core work, lumbar exercises (potential flexion bias) as tolerated    PT Home Exercise Plan Access Code [email or web address]    Consulted and Agree with Plan of Care Patient           Patient will benefit from skilled therapeutic intervention in order to improve the following deficits and impairments:  Pain,Impaired flexibility,Decreased strength,Decreased activity tolerance,Increased edema,Decreased range of motion,Impaired tone,Abnormal gait,Decreased balance,Difficulty walking,Impaired sensation  Visit Diagnosis: Chronic right-sided low back pain with right-sided sciatica  Pain in left elbow  Pain in right elbow  Repeated falls  Multiple sclerosis ([place]     Problem List Patient Active  Problem List   Diagnosis Date Noted  . Other insomnia 08/11/2020  . Generalized anxiety disorder 08/11/2020  . 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[facility] 12/31/2017  . Optic neuritis 08/02/2017  . High risk medication use 08/02/2017  . Vitamin  D deficiency 09/19/2016  . Hypercholesterolemia 09/19/2016  . Numbness 12/16/2015  . Spastic gait 12/16/2015  . Urinary hesitancy 12/16/2015  . Low back pain with sciatica 09/28/2014  . Polysubstance dependence ([place]) 02/09/2014  . 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 Relapsing remitting multiple sclerosis ([place]) 05/28/2013  . Elevated BP 01/06/2013  . Bipolar disorder, unspecified ([place]) 01/03/2013  . Bipolar disorder ([place]) 01/03/2013  . Right hemiparesis ([place]) 01/02/2013  . Multiple sclerosis flare 01/02/2013  . Hemiplegia ([place]) 01/02/2013    Kevan Ny Kellsie Grindle 11/03/2020, 12:14 PM  [facility] [address] [place], [place], [address] Phone: [phone]   Fax:  [phone]  Name: LEIF LOFLIN MRN: [id number] Date of Birth: Jan 23, 1981    PHYSICAL THERAPY DISCHARGE SUMMARY  Visits from Start of Care: 8  Current functional level related to goals / functional outcomes: See goals   Remaining deficits: Unknown   Education / Equipment: HEP  Plan: Patient agrees to discharge.  Patient goals were not met. Patient is being discharged due to not returning since the last visit.  ?????     Vanessa Middleton, PT, DPT

## 2020-11-04 ENCOUNTER — Encounter: Payer: Medicaid Other | Admitting: Physical Therapy

## 2020-11-08 ENCOUNTER — Telehealth: Payer: Self-pay

## 2020-11-08 ENCOUNTER — Ambulatory Visit: Payer: Medicaid Other

## 2020-11-08 NOTE — Telephone Encounter (Signed)
Pt contacted and confirmed DOB. Pt was informed that due to his 3rd no-show, he is discharged from PT at [facility]. He was informed that if he would like to continue with PT at [facility] in the future, he would need a new referral and would need to be scheduled as a new patient. He demonstrated understanding.

## 2020-11-09 ENCOUNTER — Encounter: Payer: Medicaid Other | Admitting: Physical Therapy

## 2020-11-12 ENCOUNTER — Non-Acute Institutional Stay (HOSPITAL_COMMUNITY)
Admission: RE | Admit: 2020-11-12 | Discharge: 2020-11-12 | Disposition: A | Discharge status: Home / Self Care | Payer: Medicaid Other | Source: Ambulatory Visit | Attending: Internal Medicine | Admitting: Internal Medicine

## 2020-11-12 ENCOUNTER — Other Ambulatory Visit: Payer: Self-pay

## 2020-11-12 DIAGNOSIS — G35 Multiple sclerosis: Secondary | ICD-10-CM | POA: Insufficient documentation

## 2020-11-12 MED ORDER — SODIUM CHLORIDE 0.9 % IV SOLN
INTRAVENOUS | Status: DC | PRN
  Administered 2020-11-12: 250 mL via INTRAVENOUS

## 2020-11-12 MED ORDER — SODIUM CHLORIDE 0.9 % IV SOLN
300.0000 mg | INTRAVENOUS | Status: DC
  Administered 2020-11-12: 300 mg via INTRAVENOUS
  Filled 2020-11-12: qty 15

## 2020-11-12 NOTE — Discharge Instructions (Signed)
Natalizumab injection What is this medicine? NATALIZUMAB (na ta LIZ you mab) is used to treat relapsing multiple sclerosis. This drug is not a cure. It is also used to treat Crohn's disease. This medicine may be used for other purposes; ask your health care provider or pharmacist if you have questions. COMMON BRAND NAME(S): Tysabri What should I tell my health care provider before I take this medicine? They need to know if you have any of these conditions:  immune system problems  progressive multifocal leukoencephalopathy (PML)  an unusual or allergic reaction to natalizumab, other medicines, foods, dyes, or preservatives  pregnant or trying to get pregnant  breast-feeding How should I use this medicine? This medicine is for infusion into a vein. It is given by a health care professional in a hospital or clinic setting. A special MedGuide will be given to you by the pharmacist with each prescription and refill. Be sure to read this information carefully each time. Talk to your pediatrician regarding the use of this medicine in children. This medicine is not approved for use in children. Overdosage: If you think you have taken too much of this medicine contact a poison control center or emergency room at once. NOTE: This medicine is only for you. Do not share this medicine with others. What if I miss a dose? It is important not to miss your dose. Call your doctor or health care professional if you are unable to keep an appointment. What may interact with this medicine? Do not take this medicine with any of the following medications:  biologic medicines such as adalimumab, certolizumab, etanercept, golimumab, infliximab This medicine may also interact with the following medications:  azathioprine  cyclosporine  interferons  6-mercaptopurine  methotrexate  other medicines that lower your chance of fighting an infection  steroid medicines like prednisone or  cortisone  vaccines This list may not describe all possible interactions. Give your health care provider a list of all the medicines, herbs, non-prescription drugs, or dietary supplements you use. Also tell them if you smoke, drink alcohol, or use illegal drugs. Some items may interact with your medicine. What should I watch for while using this medicine? Your condition will be monitored carefully while you are receiving this medicine. Visit your doctor for regular check ups. Tell your doctor or healthcare professional if your symptoms do not start to get better or if they get worse. Stay away from people who are sick. Call your doctor or health care professional for advice if you get a fever, chills or sore throat, or other symptoms of a cold or flu. Do not treat yourself. In some patients, this medicine may cause a serious brain infection that may cause death. If you have any problems seeing, thinking, speaking, walking, or standing, tell your doctor right away. If you cannot reach your doctor, get urgent medical care. What side effects may I notice from receiving this medicine? Side effects that you should report to your doctor or health care professional as soon as possible:  allergic reactions like skin rash, itching or hives, swelling of the face, lips, or tongue  breathing problems  changes in vision  chest pain  confusion  depressed mood  dizziness  feeling faint; lightheaded; falls  general ill feeling or flu-like symptoms  loss of memory  missed menstrual periods  muscle weakness  problems with balance, talking, or walking  signs and symptoms of liver injury like dark yellow or brown urine; general ill feeling or flu-like symptoms; light-colored   stools; loss of appetite; nausea; right upper belly pain; unusually weak or tired; yellowing of the eyes or skin  suicidal thoughts, mood changes  unusual bruising or bleeding  unusually weak or tired Side effects that  usually do not require medical attention (report to your doctor or health care professional if they continue or are bothersome):  headache  joint pain  muscle cramps  muscle pain  nausea, vomiting  pain, redness, or irritation at site where injected  tiredness This list may not describe all possible side effects. Call your doctor for medical advice about side effects. You may report side effects to FDA at 1-800-FDA-1088. Where should I keep my medicine? This drug is given in a hospital or clinic and will not be stored at home. NOTE: This sheet is a summary. It may not cover all possible information. If you have questions about this medicine, talk to your doctor, pharmacist, or health care provider.  2021 Elsevier/Gold Standard (2018-12-09 13:20:26)  

## 2020-11-12 NOTE — Progress Notes (Signed)
Patient received IV Tysabri as ordered by Despina Arias, MD. Declined the 60 minutes post infusion observation.Tolerated well, vitals stable, discharge instructions given, verbalized understanding. Alert, oriented and ambulatory with the aid of a cane at the time of discharge.

## 2020-11-16 ENCOUNTER — Ambulatory Visit: Payer: Medicaid Other | Admitting: Neurology

## 2020-11-16 ENCOUNTER — Encounter: Payer: Self-pay | Admitting: Neurology

## 2020-11-16 VITALS — BP 133/73 | HR 80 | Ht 71.0 in | Wt 189.0 lb

## 2020-11-16 DIAGNOSIS — R261 Paralytic gait: Secondary | ICD-10-CM | POA: Diagnosis not present

## 2020-11-16 DIAGNOSIS — H469 Unspecified optic neuritis: Secondary | ICD-10-CM

## 2020-11-16 DIAGNOSIS — R3911 Hesitancy of micturition: Secondary | ICD-10-CM

## 2020-11-16 DIAGNOSIS — F3170 Bipolar disorder, currently in remission, most recent episode unspecified: Secondary | ICD-10-CM

## 2020-11-16 DIAGNOSIS — G35 Multiple sclerosis: Secondary | ICD-10-CM

## 2020-11-16 DIAGNOSIS — Z79899 Other long term (current) drug therapy: Secondary | ICD-10-CM

## 2020-11-16 NOTE — Progress Notes (Signed)
[name]  PATIENT: Bradley Brandt DOB: [birth year]  REFERRING DOCTOR OR PCP:  Thomes Dinning SOURCE: patient, records form Dr. Gaynelle Adu, MRI reports and images on PACS  _________________________________   HISTORICAL  CHIEF COMPLAINT:  Chief Complaint  Patient presents with  . Multiple Sclerosis    Room 13, alone in room    HISTORY OF PRESENT ILLNESS:  Bradley Brandt is a 40 y.o.  man with relapsing remitting multiple sclerosis.     Update 05/26/2020:   He is on Tysabri and gets infusions at [facility].   He tolerates it well.   Last JCV Ab was negative at 0.16 on 05/26/2020.    He feels he has been neurologically stable for the most part .  He has reduced gait due to balance, strength and spasticity. He last fell about 2-3 weeks ago - no injury.    He is on baclofen for spasticity and dalfampridine for the walking.   Vision is doing well.  He has an eye appointment in a couple weeks for new glasses.    He has urinary hesitancy helped by tamsulosin.  No bladder infections.   No recent incontinence  He feels fatigued but this is stable.   He notes some depression, similar to earlier in the year.   He has more anxiety.   He is seeing psychiatry again.   He was diagnosed with bipolar disease   He is sleeping worse.[name]   He notes cognition is about the same.  Focus is sometimes poor.    He has had his Covid-19 vaccinations and booster.     He is concerned aboutt the weight loss though current weight but weight is fairly stable.       MS history: Bradley Brandt was diagnosed around 2001 when he presented with right-sided numbness and clumsiness and weakness.   Initially, he was on Avonex and then Rebif but had relapses on both of them.  He could not tolerate Tecfidera.   He was started on Tysabri about 1 year ago and his last infusion was at [facility] at 11/08/2015.  He was seen Dr. Gaynelle Adu [facility] and those records were reviewed. He did  receive a course of Solu-Medrol March 2015 and another course of Solu-Medrol January 2016 when he presented with left leg weakness and numbness that was new    An MRI of the brain 08/28/2013 personally reviewed shows old extensive white matter changes in both hemispheres with multiple sclerosis. There were new areas of restricted diffusion without definite enhancement that could be consistent with ongoing demyelination when compared to the MRI from 05/13/2013.  Imaging: MRI of the brain 08/30/2019 showed multiple T2/FLAIR hyperintense foci in the hemispheres, pons, cerebellum and thalamus in a pattern and configuration consistent with chronic demyelinating plaque associated with multiple sclerosis.  None of the foci appear to be acute and they do not enhance.  Compared to the MRI dated 01/14/2019, there is no interval progression.  MRI of the cervical spine 01/06/2018 showed T2 hyperintense foci within the spinal cord posteriorly adjacent to C2, to the right adjacent to C3, to the left adjacent to C4 and centrally and to the right adjacent to C4-C5 and C5.   None of these appear to be acute and they were all present on the 2015 MRI.[name]    MRI of the brain 01/06/2018 showed multiple T2/FLAIR hyperintense foci in the hemispheres and in the left middle cerebellar peduncle consistent with chronic demyelinating plaque  associated with multiple sclerosis.  None of the foci appears to be acute.  None of them enhance.  However, when compared to the 2015 MRI focus in the cerebellum and a couple foci in the hemispheres were not clearly present on the earlier MRI.    REVIEW OF SYSTEMS: Constitutional: No fevers, chills, sweats, or change in appetite.   Notes fatigue Eyes: No visual changes, double vision, eye pain Ear, nose and throat: No hearing loss, ear pain, nasal congestion, sore throat Cardiovascular: No chest pain, palpitations Respiratory: No shortness of breath at rest or with exertion.   No  wheezes GastrointestinaI: No nausea, vomiting, diarrhea, abdominal pain, fecal incontinence Genitourinary: He has hesitancy and frequency.   No UTI's. Musculoskeletal: No neck pain, back pain Integumentary: No rash, pruritus, skin lesions Neurological: as above Psychiatric: No depression at this time.  No anxiety Endocrine: No palpitations, diaphoresis, change in appetite, change in weigh or increased thirst Hematologic/Lymphatic: No anemia, purpura, petechiae. Allergic/Immunologic: No itchy/runny eyes, nasal congestion, recent allergic reactions, rashes  ALLERGIES: Allergies  Allergen Reactions  . Lithium Rash    HOME MEDICATIONS:  Current Outpatient Medications:  .  acetaminophen (TYLENOL) 500 MG tablet, Take 500 mg by mouth 2 (two) times daily., Disp: , Rfl:  .  baclofen (LIORESAL) 20 MG tablet, Take 1 tablet (20 mg total) by mouth 3 (three) times daily., Disp: 90 tablet, Rfl: 11 .  dalfampridine 10 MG TB12, Take 1 tablet by mouth every 12 hours., Disp: 60 tablet, Rfl: 11 .  escitalopram (LEXAPRO) 10 MG tablet, Take 1 tablet (10 mg total) by mouth daily., Disp: 30 tablet, Rfl: 1 .  gabapentin (NEURONTIN) 800 MG tablet, Take 1 tablet (800 mg total) by mouth daily., Disp: 30 tablet, Rfl: 2 .  hydrOXYzine (ATARAX/VISTARIL) 50 MG tablet, Take 1 tablet (50 mg total) by mouth 3 (three) times daily as needed., Disp: 90 tablet, Rfl: 1 .  methylPREDNISolone (MEDROL DOSEPAK) 4 MG TBPK tablet, 6 pills po x 1 day, then 5 pills po x 1 day, then 4 pills po x 1 day, then 3 pills po x 1 day, then 2 pills po x 1 day, then 1 pill po x 1 day, Disp: 21 tablet, Rfl: 0 .  natalizumab (TYSABRI) 300 MG/15ML injection, Inject 15 mLs (300 mg total) into the vein every 28 (twenty-eight) days., Disp: 15 mL, Rfl: 5 .  QUEtiapine (SEROQUEL) 100 MG tablet, Take 1.5 tablets (150 mg total) by mouth at bedtime., Disp: 30 tablet, Rfl: 1 .  tamsulosin (FLOMAX) 0.4 MG CAPS capsule, Take 2 capsules (0.8 mg total) by  mouth daily., Disp: 60 capsule, Rfl: 11 .  Vitamin D, Ergocalciferol, (DRISDOL) 1.25 MG (50000 UNIT) CAPS capsule, Take 1 capsule (50,000 Units total) by mouth every 7 (seven) days., Disp: 13 capsule, Rfl: 3  PAST MEDICAL HISTORY: Past Medical History:  Diagnosis Date  . Bipolar 1 disorder (HCC)   . Bipolar disorder, unspecified (HCC) 01/03/2013  . MS (multiple sclerosis) (HCC) dx 2006   relapsing-remitting right sided weakness  . Neuromuscular disorder (HCC)    MS    PAST SURGICAL HISTORY: Past Surgical History:  Procedure Laterality Date  . TUMOR REMOVAL  1983   abdomen benign    FAMILY HISTORY: Family History  Adopted: Yes  Problem Relation Age of Onset  . Lupus Mother   . Ataxia Neg Hx   . Chorea Neg Hx   . Dementia Neg Hx   . Mental retardation Neg Hx   . Migraines  Neg Hx   . Multiple sclerosis Neg Hx   . Neurofibromatosis Neg Hx   . Neuropathy Neg Hx   . Parkinsonism Neg Hx   . Seizures Neg Hx   . Stroke Neg Hx     SOCIAL HISTORY:  Social History   Socioeconomic History  . Marital status: Single    Spouse name: Not on file  . Number of children: Not on file  . Years of education: Not on file  . Highest education level: Not on file  Occupational History  . Not on file  Tobacco Use  . Smoking status: Current Every Day Smoker    Packs/day: 1.00  . Smokeless tobacco: Never Used  Vaping Use  . Vaping Use: Former  Substance and Sexual Activity  . Alcohol use: Yes    Alcohol/week: 14.0 - 21.0 standard drinks    Types: 14 - 21 Shots of liquor per week  . Drug use: Yes    Frequency: 7.0 times per week    Types: Marijuana  . Sexual activity: Yes    Partners: Female  Other Topics Concern  . Not on file  Social History Narrative  . Not on file   Social Determinants of Health   Financial Resource Strain: Not on file  Food Insecurity: Not on file  Transportation Needs: Not on file  Physical Activity: Not on file  Stress: Not on file  Social  Connections: Not on file  Intimate Partner Violence: Not on file     PHYSICAL EXAM  Vitals:   11/16/20 1522  BP: 133/73  Pulse: 80  Weight: 189 lb (85.7 kg)  Height: 5\' 11"  (1.803 m)    Body mass index is 26.36 kg/m.   General: The patient is well-developed and well-nourished and in no acute distress.   Neurologic Exam  Mental status: The patient is alert and oriented x 3 at the time of the examination. The patient has apparent normal recent and remote memory, with an apparently normal attention span and concentration ability.   Speech is normal.  Cranial nerves: Extraocular movements are full.   2+ right APD.   Reduced visual acuity and reduced color vision OD     Facial strength is normal. Trapezius strength is strong.  No obvious hearing deficits are noted.  Motor:  Muscle bulk is normal.   Left rapid altering movements are slow in the hand.  There is increased muscle tone in the left arm and both legs, left greater than right.  Strength is 5/5 in the right arm, 4+/5 in the left arm.  And 4- to 4/5 in the right leg and 4/5 in the left leg.  Sensory: He has reduced vibration sensation in the left leg but reduced touch/temp on the right.    Coordination: Cerebellar testing reveals mildly reduced left finger-nose-finger and mildly ataxic heel-to-shin bilaterally, worse on left  Gait and station: Station is normal.   He has a spastic gait and left foot drop.  He can take steps without his cane but walks faster with it.    He cannot tandem walk.   Romberg is borderline  Reflexes: Deep tendon reflexes are increased with spread at the knees.  He has nonsustained clonus at the ankles      ____________________________________________  Relapsing remitting multiple sclerosis (HCC) - Plan: Stratify JCV Antibody Test ([facility]), CBC with Differential/Platelet  High risk medication use  Spastic gait  Urinary hesitancy  Bipolar disorder in full remission, most recent episode  unspecified type (  HCC)  Optic neuritis  1.   Continue Tysabri  .Check JCV Ab CBCwith diff today 2.   Continue Ampyra 10 mg twice daily for gait and baclofen for spasticity 3.   Continue gabapentin for dysesthesia and back pain. 4.   Stay active.   Exercise as tolerated 5.   Return in 6 months or sooner if new or worsening neurologic symptoms.    Chrisangel Eskenazi A. Epimenio Foot, MD, PhD 11/16/2020, 3:47 PM Certified in Neurology, Clinical Neurophysiology, Sleep Medicine, Pain Medicine and Neuroimaging  [facility] [address], [address] [place], [place] [address] [phone]

## 2020-11-17 LAB — CBC WITH DIFFERENTIAL/PLATELET
Basophils Absolute: 0 10*3/uL (ref 0.0–0.2)
Basos: 1 %
EOS (ABSOLUTE): 0.1 10*3/uL (ref 0.0–0.4)
Eos: 2 %
Hematocrit: 43.8 % (ref 37.5–51.0)
Hemoglobin: 15.2 g/dL (ref 13.0–17.7)
Immature Grans (Abs): 0 10*3/uL (ref 0.0–0.1)
Immature Granulocytes: 0 %
Lymphocytes Absolute: 3.9 10*3/uL — ABNORMAL HIGH (ref 0.7–3.1)
Lymphs: 53 %
MCH: 31.8 pg (ref 26.6–33.0)
MCHC: 34.7 g/dL (ref 31.5–35.7)
MCV: 92 fL (ref 79–97)
Monocytes Absolute: 0.4 10*3/uL (ref 0.1–0.9)
Monocytes: 6 %
NRBC: 1 % — ABNORMAL HIGH (ref 0–0)
Neutrophils Absolute: 2.7 10*3/uL (ref 1.4–7.0)
Neutrophils: 38 %
Platelets: 176 10*3/uL (ref 150–450)
RBC: 4.78 x10E6/uL (ref 4.14–5.80)
RDW: 14.2 % (ref 11.6–15.4)
WBC: 7.2 10*3/uL (ref 3.4–10.8)

## 2020-11-18 DIAGNOSIS — F411 Generalized anxiety disorder: Secondary | ICD-10-CM | POA: Diagnosis not present

## 2020-11-21 DIAGNOSIS — Z09 Encounter for follow-up examination after completed treatment for conditions other than malignant neoplasm: Secondary | ICD-10-CM | POA: Insufficient documentation

## 2020-11-21 DIAGNOSIS — Z79899 Other long term (current) drug therapy: Secondary | ICD-10-CM | POA: Insufficient documentation

## 2020-11-21 DIAGNOSIS — M899 Disorder of bone, unspecified: Secondary | ICD-10-CM | POA: Insufficient documentation

## 2020-11-21 DIAGNOSIS — G709 Myoneural disorder, unspecified: Secondary | ICD-10-CM | POA: Insufficient documentation

## 2020-11-21 DIAGNOSIS — G35 Multiple sclerosis: Secondary | ICD-10-CM | POA: Insufficient documentation

## 2020-11-21 DIAGNOSIS — Z789 Other specified health status: Secondary | ICD-10-CM | POA: Insufficient documentation

## 2020-11-21 DIAGNOSIS — G894 Chronic pain syndrome: Secondary | ICD-10-CM | POA: Insufficient documentation

## 2020-11-21 NOTE — Progress Notes (Deleted)
No-show to initial evaluation °

## 2020-11-22 ENCOUNTER — Ambulatory Visit: Payer: Medicaid Other | Admitting: Pain Medicine

## 2020-11-22 DIAGNOSIS — G894 Chronic pain syndrome: Secondary | ICD-10-CM

## 2020-11-22 DIAGNOSIS — Z789 Other specified health status: Secondary | ICD-10-CM

## 2020-11-22 DIAGNOSIS — F1291 Cannabis use, unspecified, in remission: Secondary | ICD-10-CM | POA: Insufficient documentation

## 2020-11-22 DIAGNOSIS — G35 Multiple sclerosis: Secondary | ICD-10-CM

## 2020-11-22 DIAGNOSIS — F1290 Cannabis use, unspecified, uncomplicated: Secondary | ICD-10-CM | POA: Insufficient documentation

## 2020-11-22 DIAGNOSIS — Z87898 Personal history of other specified conditions: Secondary | ICD-10-CM | POA: Insufficient documentation

## 2020-11-22 DIAGNOSIS — Z79899 Other long term (current) drug therapy: Secondary | ICD-10-CM

## 2020-11-22 DIAGNOSIS — G709 Myoneural disorder, unspecified: Secondary | ICD-10-CM

## 2020-11-22 DIAGNOSIS — M899 Disorder of bone, unspecified: Secondary | ICD-10-CM

## 2020-11-23 ENCOUNTER — Encounter (HOSPITAL_COMMUNITY): Payer: Medicaid Other | Admitting: Physician Assistant

## 2020-11-23 ENCOUNTER — Telehealth: Payer: Self-pay | Admitting: Nurse Practitioner

## 2020-11-23 ENCOUNTER — Telehealth: Payer: Self-pay | Admitting: *Deleted

## 2020-11-23 DIAGNOSIS — G35 Multiple sclerosis: Secondary | ICD-10-CM | POA: Diagnosis not present

## 2020-11-23 NOTE — Telephone Encounter (Signed)
..   Medicaid Managed Care   Unsuccessful Outreach Note  11/23/2020 Name: Bradley Brandt MRN: [id number] DOB: [birth year]  Referred by: Barbette Merino, NP Reason for referral : High Risk Managed Medicaid (Attempted to reach this patient today to get him scheduled with the [facility] BSW. Unable to leave a message and he did not answer.)   An unsuccessful telephone outreach was attempted today. The patient was referred to the case management team for assistance with care management and care coordination.   Follow Up Plan: The care management team will reach out to the patient again over the next 7 days.   Weston Settle Care Guide, High Risk Medicaid Managed Care Embedded Care Coordination [facility]  [facility]

## 2020-11-23 NOTE — Telephone Encounter (Signed)
JCV ab drawn on 11/16/20 negative, index: 0.15

## 2020-11-24 ENCOUNTER — Ambulatory Visit (HOSPITAL_COMMUNITY): Payer: Medicaid Other | Admitting: Licensed Clinical Social Worker

## 2020-11-24 ENCOUNTER — Telehealth: Payer: Self-pay | Admitting: Nurse Practitioner

## 2020-11-24 DIAGNOSIS — G35 Multiple sclerosis: Secondary | ICD-10-CM | POA: Diagnosis not present

## 2020-11-24 NOTE — Telephone Encounter (Signed)
..   Medicaid Managed Care   Unsuccessful Outreach Note  11/24/2020 Name: Bradley Brandt MRN: [id number] DOB: [birth year]  Referred by: Barbette Merino, NP Reason for referral : High Risk Managed Medicaid (Attempted to reach patient to get him scheduled with the [facility] RNCM and Pharmacist. Unable to leave a message on the number in the chart. Insurance provided me an alternate number but he did not answer. I did leave a  message with my name and my phone number.)   A second unsuccessful telephone outreach was attempted today. The patient was referred to the case management team for assistance with care management and care coordination.   Follow Up Plan: The care management team will reach out to the patient again over the next 5 days.   Weston Settle Care Guide, High Risk Medicaid Managed Care Embedded Care Coordination [facility]  [facility]

## 2020-11-25 ENCOUNTER — Telehealth: Payer: Self-pay | Admitting: *Deleted

## 2020-11-25 ENCOUNTER — Encounter: Payer: Medicaid Other | Admitting: Physical Therapy

## 2020-11-25 DIAGNOSIS — G35 Multiple sclerosis: Secondary | ICD-10-CM | POA: Diagnosis not present

## 2020-11-25 NOTE — Telephone Encounter (Signed)
Faxed completed/signed [employer] orders back re request for power mobility device. Fax: [phone]. Received fax confirmation.

## 2020-11-26 DIAGNOSIS — G35 Multiple sclerosis: Secondary | ICD-10-CM | POA: Diagnosis not present

## 2020-11-27 DIAGNOSIS — G35 Multiple sclerosis: Secondary | ICD-10-CM | POA: Diagnosis not present

## 2020-11-28 DIAGNOSIS — G35 Multiple sclerosis: Secondary | ICD-10-CM | POA: Diagnosis not present

## 2020-11-29 DIAGNOSIS — G35 Multiple sclerosis: Secondary | ICD-10-CM | POA: Diagnosis not present

## 2020-11-30 DIAGNOSIS — G35 Multiple sclerosis: Secondary | ICD-10-CM | POA: Diagnosis not present

## 2020-12-01 ENCOUNTER — Other Ambulatory Visit: Payer: Self-pay

## 2020-12-01 DIAGNOSIS — G35 Multiple sclerosis: Secondary | ICD-10-CM | POA: Diagnosis not present

## 2020-12-01 NOTE — Patient Outreach (Signed)
Called patient this morning for MM appt at number on schedule. Bradley Brandt answered but was unable to confirm name/birthday. I tried to communicate but I heard a "thud" and no longer heard Bradley Brandt's voice. I'm not sure if he dropped the phone and was unable to get it but I waited for 30 seconds and didn't hear a response. Will try to call again and conduct a visit

## 2020-12-02 DIAGNOSIS — G35 Multiple sclerosis: Secondary | ICD-10-CM | POA: Diagnosis not present

## 2020-12-03 DIAGNOSIS — G35 Multiple sclerosis: Secondary | ICD-10-CM | POA: Diagnosis not present

## 2020-12-04 DIAGNOSIS — G35 Multiple sclerosis: Secondary | ICD-10-CM | POA: Diagnosis not present

## 2020-12-05 DIAGNOSIS — G35 Multiple sclerosis: Secondary | ICD-10-CM | POA: Diagnosis not present

## 2020-12-06 DIAGNOSIS — G35 Multiple sclerosis: Secondary | ICD-10-CM | POA: Diagnosis not present

## 2020-12-07 DIAGNOSIS — G35 Multiple sclerosis: Secondary | ICD-10-CM | POA: Diagnosis not present

## 2020-12-08 ENCOUNTER — Other Ambulatory Visit: Payer: Self-pay | Admitting: *Deleted

## 2020-12-08 DIAGNOSIS — H40023 Open angle with borderline findings, high risk, bilateral: Secondary | ICD-10-CM | POA: Diagnosis not present

## 2020-12-08 DIAGNOSIS — H5212 Myopia, left eye: Secondary | ICD-10-CM | POA: Diagnosis not present

## 2020-12-08 DIAGNOSIS — H468 Other optic neuritis: Secondary | ICD-10-CM | POA: Diagnosis not present

## 2020-12-08 DIAGNOSIS — H31091 Other chorioretinal scars, right eye: Secondary | ICD-10-CM | POA: Diagnosis not present

## 2020-12-08 DIAGNOSIS — H52222 Regular astigmatism, left eye: Secondary | ICD-10-CM | POA: Diagnosis not present

## 2020-12-08 DIAGNOSIS — H1045 Other chronic allergic conjunctivitis: Secondary | ICD-10-CM | POA: Diagnosis not present

## 2020-12-08 DIAGNOSIS — G35 Multiple sclerosis: Secondary | ICD-10-CM | POA: Diagnosis not present

## 2020-12-08 NOTE — Patient Outreach (Signed)
Care Coordination  12/08/2020  Bradley Brandt Aug 10, 1980 [id number]   Medicaid Managed Care   Unsuccessful Outreach Note  12/08/2020 Name: Bradley Brandt MRN: [id number] DOB: 04/16/1981  Referred by: Barbette Merino, NP Reason for referral : High Risk Managed Medicaid (Unsuccessful RNCM initial outreach)   An unsuccessful telephone outreach was attempted today. The patient was referred to the case management team for assistance with care management and care coordination.   Follow Up Plan: The care management team will reach out to the patient again over the next 14 days.   [name] RN, BSN [place]  [facility]

## 2020-12-08 NOTE — Patient Instructions (Signed)
Visit Information  [name]  - as a part of your Medicaid benefit, you are eligible for care management and care coordination services at no cost or copay. I was unable to reach you by phone today but would be happy to help you with your health related needs. Please feel free to call me @ [phone] or to reschedule, call [name] @ [phone].   A member of the Managed Medicaid care management team will reach out to you again over the next 7-14 days.   [name] RN, BSN [place]  [facility]

## 2020-12-09 DIAGNOSIS — G35 Multiple sclerosis: Secondary | ICD-10-CM | POA: Diagnosis not present

## 2020-12-10 DIAGNOSIS — G35 Multiple sclerosis: Secondary | ICD-10-CM | POA: Diagnosis not present

## 2020-12-11 DIAGNOSIS — G35 Multiple sclerosis: Secondary | ICD-10-CM | POA: Diagnosis not present

## 2020-12-12 DIAGNOSIS — G35 Multiple sclerosis: Secondary | ICD-10-CM | POA: Diagnosis not present

## 2020-12-13 DIAGNOSIS — G35 Multiple sclerosis: Secondary | ICD-10-CM | POA: Diagnosis not present

## 2020-12-14 ENCOUNTER — Non-Acute Institutional Stay (HOSPITAL_COMMUNITY)
Admission: RE | Admit: 2020-12-14 | Discharge: 2020-12-14 | Disposition: A | Discharge status: Home / Self Care | Payer: Medicaid Other | Source: Ambulatory Visit | Attending: Internal Medicine | Admitting: Internal Medicine

## 2020-12-14 ENCOUNTER — Other Ambulatory Visit: Payer: Self-pay

## 2020-12-14 DIAGNOSIS — G35 Multiple sclerosis: Secondary | ICD-10-CM | POA: Insufficient documentation

## 2020-12-14 MED ORDER — SODIUM CHLORIDE 0.9 % IV SOLN
300.0000 mg | INTRAVENOUS | Status: DC
  Administered 2020-12-14: 10:00:00 300 mg via INTRAVENOUS
  Filled 2020-12-14: qty 15

## 2020-12-14 MED ORDER — SODIUM CHLORIDE 0.9 % IV SOLN
INTRAVENOUS | Status: DC | PRN
  Administered 2020-12-14: 09:00:00 250 mL via INTRAVENOUS

## 2020-12-14 NOTE — Progress Notes (Signed)
PATIENT CARE CENTER NOTE   Diagnosis: Multiple Sclerosis       Provider: Despina Arias, MD     Procedure: Donnamarie Poag IV     Note: Patient received Tysabri infusion via PIV. Tolerated infusion well with no adverse reaction. Vital signs stable. Discharge instructions given. Patient declined to stay for the 1 hour post-infusion observation. Alert, oriented and ambulatory at discharge.

## 2020-12-15 ENCOUNTER — Other Ambulatory Visit: Payer: Self-pay

## 2020-12-15 DIAGNOSIS — G35 Multiple sclerosis: Secondary | ICD-10-CM | POA: Diagnosis not present

## 2020-12-16 DIAGNOSIS — G35 Multiple sclerosis: Secondary | ICD-10-CM | POA: Diagnosis not present

## 2020-12-17 DIAGNOSIS — G35 Multiple sclerosis: Secondary | ICD-10-CM | POA: Diagnosis not present

## 2020-12-18 ENCOUNTER — Other Ambulatory Visit: Payer: Self-pay | Admitting: Neurology

## 2020-12-18 DIAGNOSIS — G35 Multiple sclerosis: Secondary | ICD-10-CM | POA: Diagnosis not present

## 2020-12-19 DIAGNOSIS — G35 Multiple sclerosis: Secondary | ICD-10-CM | POA: Diagnosis not present

## 2020-12-20 DIAGNOSIS — G35 Multiple sclerosis: Secondary | ICD-10-CM | POA: Diagnosis not present

## 2020-12-21 DIAGNOSIS — G35 Multiple sclerosis: Secondary | ICD-10-CM | POA: Diagnosis not present

## 2020-12-21 DIAGNOSIS — R269 Unspecified abnormalities of gait and mobility: Secondary | ICD-10-CM | POA: Diagnosis not present

## 2020-12-22 DIAGNOSIS — G35 Multiple sclerosis: Secondary | ICD-10-CM | POA: Diagnosis not present

## 2020-12-23 ENCOUNTER — Ambulatory Visit (HOSPITAL_COMMUNITY): Payer: Medicaid Other | Admitting: Licensed Clinical Social Worker

## 2020-12-23 DIAGNOSIS — G35 Multiple sclerosis: Secondary | ICD-10-CM | POA: Diagnosis not present

## 2020-12-24 DIAGNOSIS — G35 Multiple sclerosis: Secondary | ICD-10-CM | POA: Diagnosis not present

## 2020-12-25 DIAGNOSIS — G35 Multiple sclerosis: Secondary | ICD-10-CM | POA: Diagnosis not present

## 2020-12-26 DIAGNOSIS — G35 Multiple sclerosis: Secondary | ICD-10-CM | POA: Diagnosis not present

## 2020-12-28 DIAGNOSIS — G35 Multiple sclerosis: Secondary | ICD-10-CM | POA: Diagnosis not present

## 2020-12-29 DIAGNOSIS — G35 Multiple sclerosis: Secondary | ICD-10-CM | POA: Diagnosis not present

## 2020-12-30 DIAGNOSIS — G35 Multiple sclerosis: Secondary | ICD-10-CM | POA: Diagnosis not present

## 2020-12-31 DIAGNOSIS — G35 Multiple sclerosis: Secondary | ICD-10-CM | POA: Diagnosis not present

## 2021-01-01 ENCOUNTER — Other Ambulatory Visit (HOSPITAL_COMMUNITY): Payer: Self-pay | Admitting: Family

## 2021-01-01 ENCOUNTER — Other Ambulatory Visit: Payer: Self-pay | Admitting: Neurology

## 2021-01-01 DIAGNOSIS — G4709 Other insomnia: Secondary | ICD-10-CM

## 2021-01-01 DIAGNOSIS — F319 Bipolar disorder, unspecified: Secondary | ICD-10-CM

## 2021-01-01 DIAGNOSIS — E559 Vitamin D deficiency, unspecified: Secondary | ICD-10-CM

## 2021-01-03 DIAGNOSIS — G35 Multiple sclerosis: Secondary | ICD-10-CM | POA: Diagnosis not present

## 2021-01-04 DIAGNOSIS — G35 Multiple sclerosis: Secondary | ICD-10-CM | POA: Diagnosis not present

## 2021-01-05 DIAGNOSIS — G35 Multiple sclerosis: Secondary | ICD-10-CM | POA: Diagnosis not present

## 2021-01-06 DIAGNOSIS — G35 Multiple sclerosis: Secondary | ICD-10-CM | POA: Diagnosis not present

## 2021-01-07 DIAGNOSIS — G35 Multiple sclerosis: Secondary | ICD-10-CM | POA: Diagnosis not present

## 2021-01-09 ENCOUNTER — Other Ambulatory Visit: Payer: Self-pay | Admitting: Neurology

## 2021-01-10 ENCOUNTER — Ambulatory Visit: Payer: Medicaid Other | Admitting: Pain Medicine

## 2021-01-10 DIAGNOSIS — G35 Multiple sclerosis: Secondary | ICD-10-CM | POA: Diagnosis not present

## 2021-01-11 ENCOUNTER — Other Ambulatory Visit: Payer: Self-pay

## 2021-01-11 ENCOUNTER — Non-Acute Institutional Stay (HOSPITAL_COMMUNITY)
Admission: RE | Admit: 2021-01-11 | Discharge: 2021-01-11 | Disposition: A | Discharge status: Home / Self Care | Payer: Medicaid Other | Source: Ambulatory Visit | Attending: Internal Medicine | Admitting: Internal Medicine

## 2021-01-11 DIAGNOSIS — G35 Multiple sclerosis: Secondary | ICD-10-CM | POA: Insufficient documentation

## 2021-01-11 MED ORDER — SODIUM CHLORIDE 0.9 % IV SOLN
INTRAVENOUS | Status: DC | PRN
  Administered 2021-01-11: 250 mL via INTRAVENOUS

## 2021-01-11 MED ORDER — SODIUM CHLORIDE 0.9 % IV SOLN
300.0000 mg | INTRAVENOUS | Status: DC
  Administered 2021-01-11: 300 mg via INTRAVENOUS
  Filled 2021-01-11: qty 15

## 2021-01-11 NOTE — Progress Notes (Signed)
PATIENT CARE CENTER NOTE  Diagnosis:Multiple Sclerosis   Provider:Sater, Richard, MD   Procedure:Tysabri IV   Note:Patient received Tysabri infusionvia PIV. Tolerated infusion well with no adverse reaction. Vital signs stable. Discharge instructions given. Patientdeclinedto stay for the1 hour post-infusionobservation. Alert, oriented and ambulatory at discharge.          

## 2021-01-12 ENCOUNTER — Other Ambulatory Visit: Payer: Self-pay | Admitting: Neurology

## 2021-01-12 DIAGNOSIS — G35 Multiple sclerosis: Secondary | ICD-10-CM | POA: Diagnosis not present

## 2021-01-13 DIAGNOSIS — G35 Multiple sclerosis: Secondary | ICD-10-CM | POA: Diagnosis not present

## 2021-01-14 DIAGNOSIS — G35 Multiple sclerosis: Secondary | ICD-10-CM | POA: Diagnosis not present

## 2021-01-17 DIAGNOSIS — G35 Multiple sclerosis: Secondary | ICD-10-CM | POA: Diagnosis not present

## 2021-01-17 NOTE — Telephone Encounter (Signed)
Faxed back signed orders for SWO, line tem justification letter, [place] certificate of medical necessity back to [employer] at [phone]. Received fax confirmation.

## 2021-01-18 DIAGNOSIS — G35 Multiple sclerosis: Secondary | ICD-10-CM | POA: Diagnosis not present

## 2021-01-18 DIAGNOSIS — R269 Unspecified abnormalities of gait and mobility: Secondary | ICD-10-CM | POA: Diagnosis not present

## 2021-01-19 DIAGNOSIS — G35 Multiple sclerosis: Secondary | ICD-10-CM | POA: Diagnosis not present

## 2021-01-20 DIAGNOSIS — G35 Multiple sclerosis: Secondary | ICD-10-CM | POA: Diagnosis not present

## 2021-01-21 DIAGNOSIS — G35 Multiple sclerosis: Secondary | ICD-10-CM | POA: Diagnosis not present

## 2021-01-24 ENCOUNTER — Ambulatory Visit: Payer: Self-pay | Admitting: Nurse Practitioner

## 2021-01-24 DIAGNOSIS — G35 Multiple sclerosis: Secondary | ICD-10-CM | POA: Diagnosis not present

## 2021-01-25 DIAGNOSIS — G35 Multiple sclerosis: Secondary | ICD-10-CM | POA: Diagnosis not present

## 2021-01-26 DIAGNOSIS — G35 Multiple sclerosis: Secondary | ICD-10-CM | POA: Diagnosis not present

## 2021-01-27 DIAGNOSIS — G35 Multiple sclerosis: Secondary | ICD-10-CM | POA: Diagnosis not present

## 2021-01-28 DIAGNOSIS — G35 Multiple sclerosis: Secondary | ICD-10-CM | POA: Diagnosis not present

## 2021-01-31 DIAGNOSIS — G35 Multiple sclerosis: Secondary | ICD-10-CM | POA: Diagnosis not present

## 2021-02-01 DIAGNOSIS — G35 Multiple sclerosis: Secondary | ICD-10-CM | POA: Diagnosis not present

## 2021-02-02 DIAGNOSIS — G35 Multiple sclerosis: Secondary | ICD-10-CM | POA: Diagnosis not present

## 2021-02-03 DIAGNOSIS — G35 Multiple sclerosis: Secondary | ICD-10-CM | POA: Diagnosis not present

## 2021-02-04 DIAGNOSIS — G35 Multiple sclerosis: Secondary | ICD-10-CM | POA: Diagnosis not present

## 2021-02-07 DIAGNOSIS — G35 Multiple sclerosis: Secondary | ICD-10-CM | POA: Diagnosis not present

## 2021-02-08 DIAGNOSIS — G35 Multiple sclerosis: Secondary | ICD-10-CM | POA: Diagnosis not present

## 2021-02-08 NOTE — Progress Notes (Deleted)
No-show to initial evaluation on 11/22/2020 and again on 02/09/2021.

## 2021-02-09 ENCOUNTER — Encounter (HOSPITAL_COMMUNITY): Payer: Medicaid Other

## 2021-02-09 ENCOUNTER — Ambulatory Visit: Payer: Medicaid Other | Admitting: Pain Medicine

## 2021-02-09 ENCOUNTER — Encounter: Payer: Self-pay | Admitting: Pain Medicine

## 2021-02-09 DIAGNOSIS — Z789 Other specified health status: Secondary | ICD-10-CM

## 2021-02-09 DIAGNOSIS — G35 Multiple sclerosis: Secondary | ICD-10-CM

## 2021-02-09 DIAGNOSIS — G959 Disease of spinal cord, unspecified: Secondary | ICD-10-CM

## 2021-02-09 DIAGNOSIS — Z79899 Other long term (current) drug therapy: Secondary | ICD-10-CM

## 2021-02-09 DIAGNOSIS — G8191 Hemiplegia, unspecified affecting right dominant side: Secondary | ICD-10-CM

## 2021-02-09 DIAGNOSIS — G709 Myoneural disorder, unspecified: Secondary | ICD-10-CM

## 2021-02-09 DIAGNOSIS — G894 Chronic pain syndrome: Secondary | ICD-10-CM

## 2021-02-09 DIAGNOSIS — M899 Disorder of bone, unspecified: Secondary | ICD-10-CM

## 2021-02-10 DIAGNOSIS — G35 Multiple sclerosis: Secondary | ICD-10-CM | POA: Diagnosis not present

## 2021-02-11 ENCOUNTER — Other Ambulatory Visit: Payer: Self-pay

## 2021-02-11 ENCOUNTER — Non-Acute Institutional Stay (HOSPITAL_COMMUNITY)
Admission: RE | Admit: 2021-02-11 | Discharge: 2021-02-11 | Disposition: A | Discharge status: Home / Self Care | Payer: Medicaid Other | Source: Ambulatory Visit | Attending: Internal Medicine | Admitting: Internal Medicine

## 2021-02-11 DIAGNOSIS — G35 Multiple sclerosis: Secondary | ICD-10-CM | POA: Insufficient documentation

## 2021-02-11 MED ORDER — SODIUM CHLORIDE 0.9 % IV SOLN
300.0000 mg | INTRAVENOUS | Status: DC
  Administered 2021-02-11: 300 mg via INTRAVENOUS
  Filled 2021-02-11: qty 15

## 2021-02-11 MED ORDER — SODIUM CHLORIDE 0.9 % IV SOLN
INTRAVENOUS | Status: DC | PRN
  Administered 2021-02-11: 250 mL via INTRAVENOUS

## 2021-02-11 NOTE — Progress Notes (Signed)
PATIENT CARE CENTER NOTE  Diagnosis:Multiple Sclerosis   Provider:Sater, Richard, MD   Procedure:Tysabri IV   Note:Patient received Tysabri infusionvia PIV. Tolerated infusion well with no adverse reaction. Vital signs stable. Discharge instructions given. Patientdeclinedto stay for the1 hour post-infusionobservation. Alert, oriented and ambulatory at discharge.          

## 2021-02-14 DIAGNOSIS — G35 Multiple sclerosis: Secondary | ICD-10-CM | POA: Diagnosis not present

## 2021-02-15 DIAGNOSIS — G35 Multiple sclerosis: Secondary | ICD-10-CM | POA: Diagnosis not present

## 2021-02-16 DIAGNOSIS — G35 Multiple sclerosis: Secondary | ICD-10-CM | POA: Diagnosis not present

## 2021-02-17 DIAGNOSIS — G35 Multiple sclerosis: Secondary | ICD-10-CM | POA: Diagnosis not present

## 2021-02-18 DIAGNOSIS — G35 Multiple sclerosis: Secondary | ICD-10-CM | POA: Diagnosis not present

## 2021-02-21 DIAGNOSIS — G35 Multiple sclerosis: Secondary | ICD-10-CM | POA: Diagnosis not present

## 2021-02-22 ENCOUNTER — Encounter: Payer: Self-pay | Admitting: Pain Medicine

## 2021-02-22 DIAGNOSIS — G35 Multiple sclerosis: Secondary | ICD-10-CM | POA: Diagnosis not present

## 2021-02-23 DIAGNOSIS — G35 Multiple sclerosis: Secondary | ICD-10-CM | POA: Diagnosis not present

## 2021-02-24 DIAGNOSIS — G35 Multiple sclerosis: Secondary | ICD-10-CM | POA: Diagnosis not present

## 2021-02-25 DIAGNOSIS — G35 Multiple sclerosis: Secondary | ICD-10-CM | POA: Diagnosis not present

## 2021-02-28 DIAGNOSIS — G35 Multiple sclerosis: Secondary | ICD-10-CM | POA: Diagnosis not present

## 2021-03-01 DIAGNOSIS — G35 Multiple sclerosis: Secondary | ICD-10-CM | POA: Diagnosis not present

## 2021-03-02 DIAGNOSIS — G35 Multiple sclerosis: Secondary | ICD-10-CM | POA: Diagnosis not present

## 2021-03-03 DIAGNOSIS — G35 Multiple sclerosis: Secondary | ICD-10-CM | POA: Diagnosis not present

## 2021-03-04 ENCOUNTER — Other Ambulatory Visit: Payer: Self-pay

## 2021-03-04 DIAGNOSIS — G35 Multiple sclerosis: Secondary | ICD-10-CM | POA: Diagnosis not present

## 2021-03-04 DIAGNOSIS — Z79899 Other long term (current) drug therapy: Secondary | ICD-10-CM

## 2021-03-11 ENCOUNTER — Non-Acute Institutional Stay (HOSPITAL_COMMUNITY)
Admission: RE | Admit: 2021-03-11 | Discharge: 2021-03-11 | Disposition: A | Discharge status: Home / Self Care | Payer: Medicaid Other | Source: Ambulatory Visit | Attending: Internal Medicine | Admitting: Internal Medicine

## 2021-03-11 ENCOUNTER — Other Ambulatory Visit: Payer: Self-pay

## 2021-03-11 DIAGNOSIS — G35 Multiple sclerosis: Secondary | ICD-10-CM | POA: Insufficient documentation

## 2021-03-11 MED ORDER — SODIUM CHLORIDE 0.9 % IV SOLN
300.0000 mg | INTRAVENOUS | Status: DC
  Administered 2021-03-11: 300 mg via INTRAVENOUS
  Filled 2021-03-11: qty 15

## 2021-03-11 MED ORDER — SODIUM CHLORIDE 0.9 % IV SOLN
INTRAVENOUS | Status: DC | PRN
  Administered 2021-03-11: 250 mL via INTRAVENOUS

## 2021-03-11 NOTE — Progress Notes (Signed)
PATIENT CARE CENTER NOTE  Diagnosis:Multiple Sclerosis   Provider:Sater, Richard, MD   Procedure:Tysabri IV   Note:Patient received Tysabri infusionvia PIV. Tolerated infusion well with no adverse reaction. Vital signs stable. Discharge instructions given. Patientdeclinedto stay for the1 hour post-infusionobservation. Alert, oriented and ambulatory at discharge.          

## 2021-03-16 ENCOUNTER — Telehealth: Payer: Self-pay | Admitting: Nurse Practitioner

## 2021-03-16 NOTE — Telephone Encounter (Signed)
..   Medicaid Managed Care   Unsuccessful Outreach Note  03/16/2021 Name: Bradley Brandt MRN: [id number] DOB: 1980/10/15  Referred by: Barbette Merino, NP Reason for referral : High Risk Managed Medicaid (I called the patient today to get his phone visits with the [facility] RNCM and the pharmacist. I also sent him a message on MyChart. I left my name and number on his VM.)   An unsuccessful telephone outreach was attempted today. The patient was referred to the case management team for assistance with care management and care coordination.   Follow Up Plan: The care management team will reach out to the patient again over the next 7-14 days.   Weston Settle Care Guide, High Risk Medicaid Managed Care Embedded Care Coordination [facility]  [facility]

## 2021-03-17 ENCOUNTER — Ambulatory Visit: Payer: Self-pay

## 2021-03-17 ENCOUNTER — Ambulatory Visit: Payer: Medicaid Other | Admitting: Neurology

## 2021-03-17 IMAGING — DX DG ELBOW COMPLETE 3+V*L*
4 series · 4 of 4 positions shown · non-contrast
Comparison: None.

CLINICAL DATA: Elbow swelling

EXAM:
LEFT ELBOW - COMPLETE 3+ VIEW

[elbow ap]
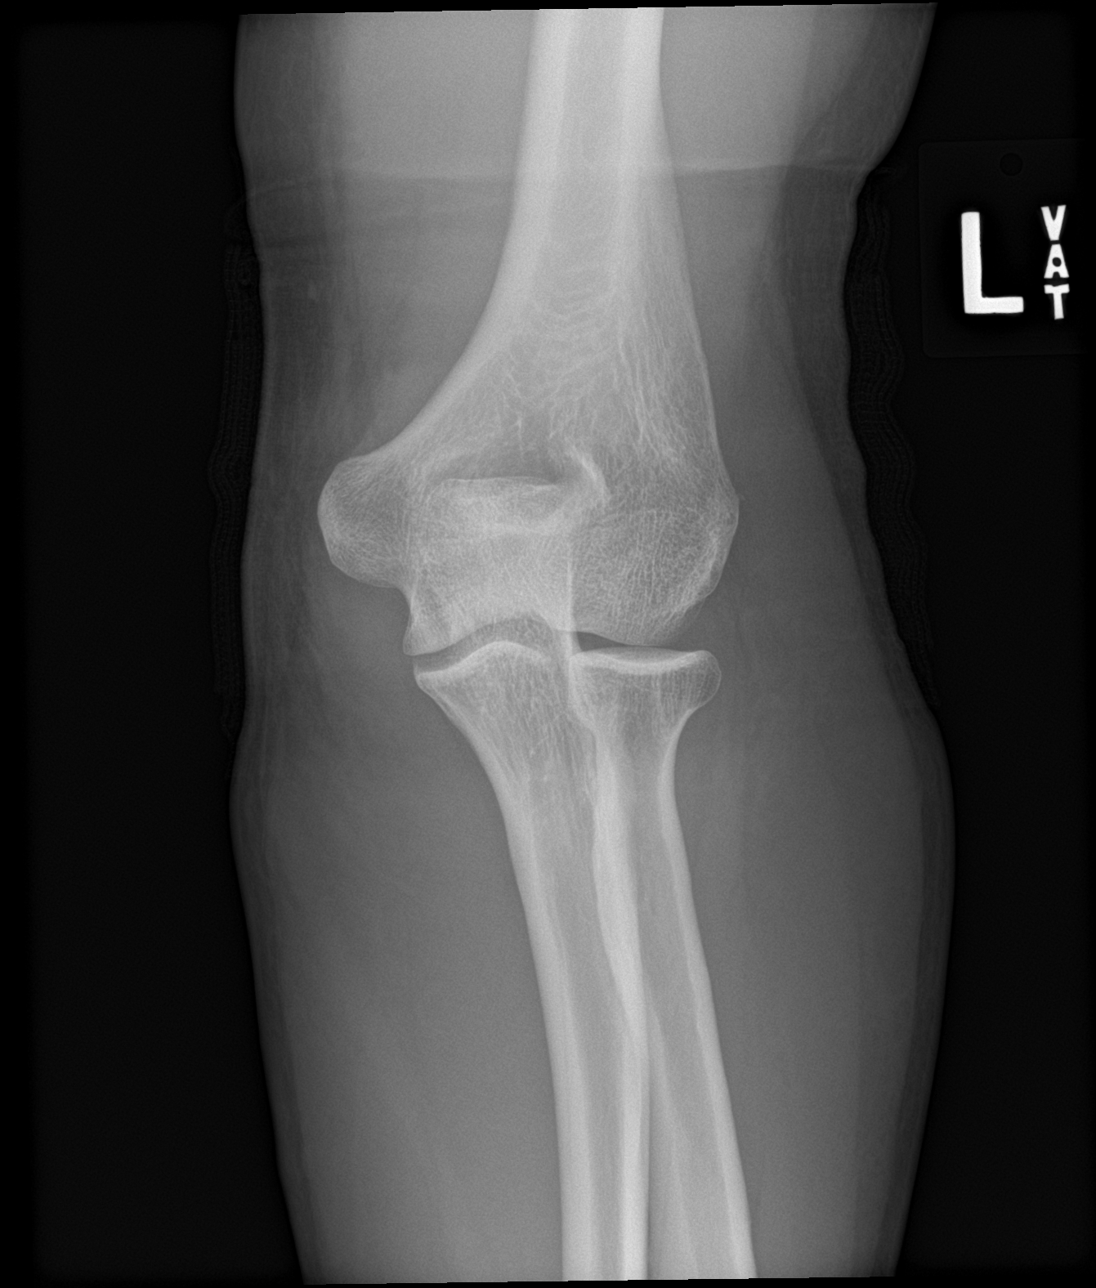

[elbow obl (1 of 2)]
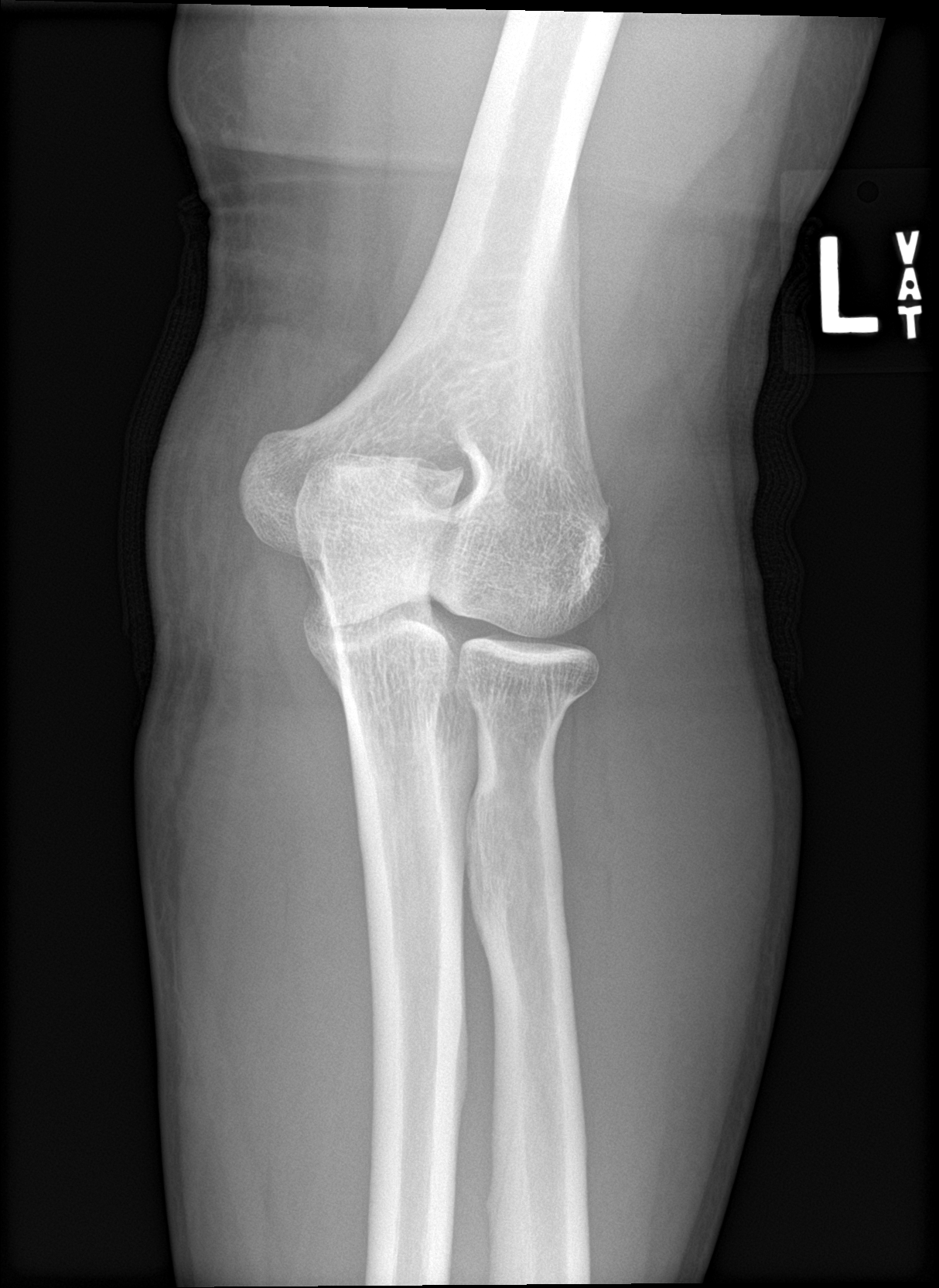

[elbow obl (2 of 2)]
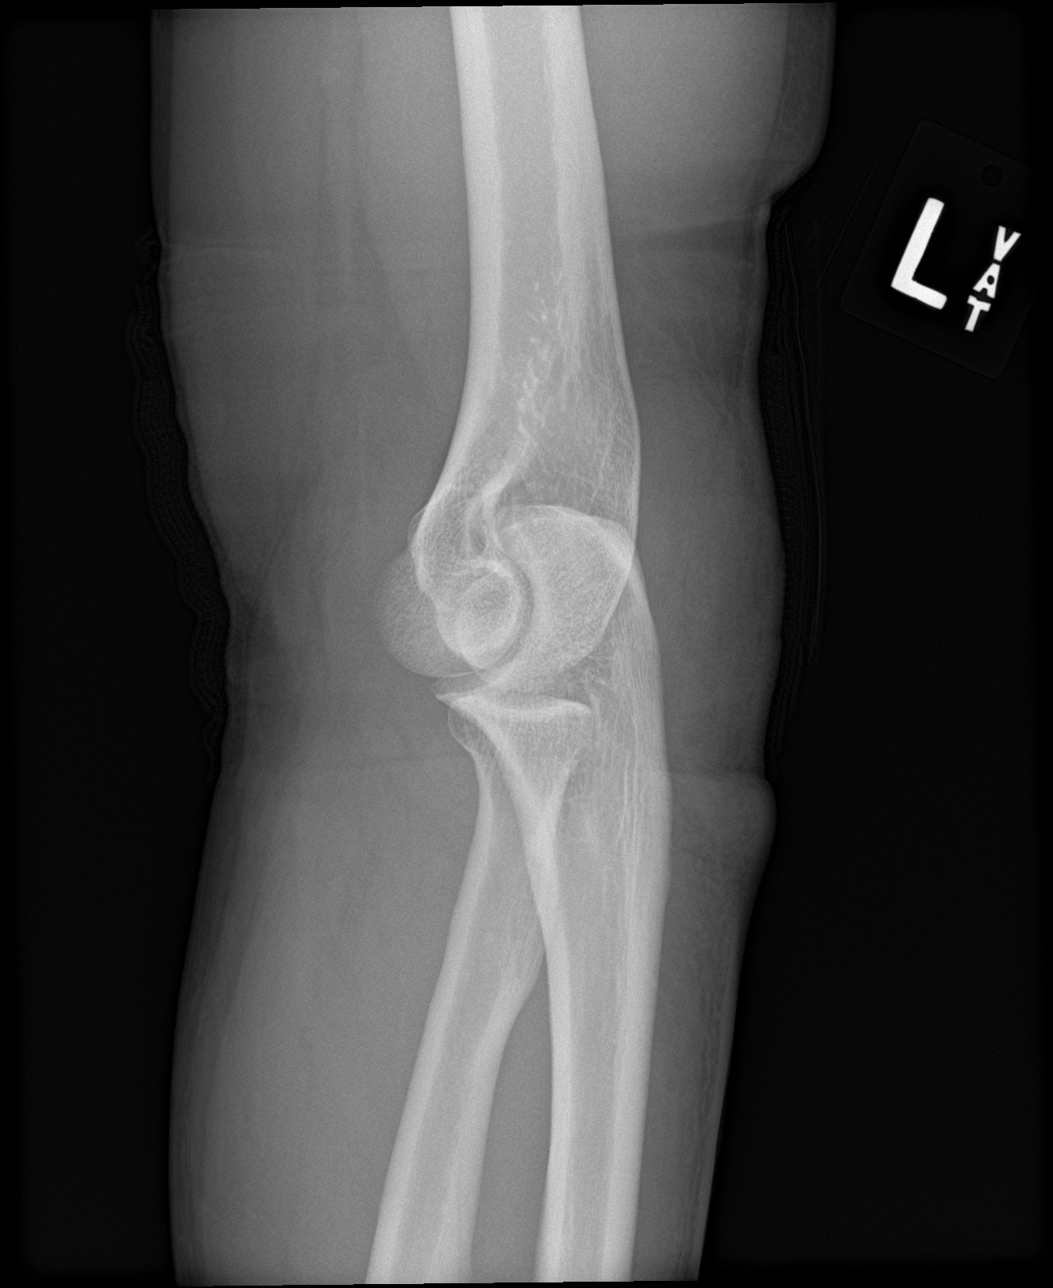

[elbow lat]
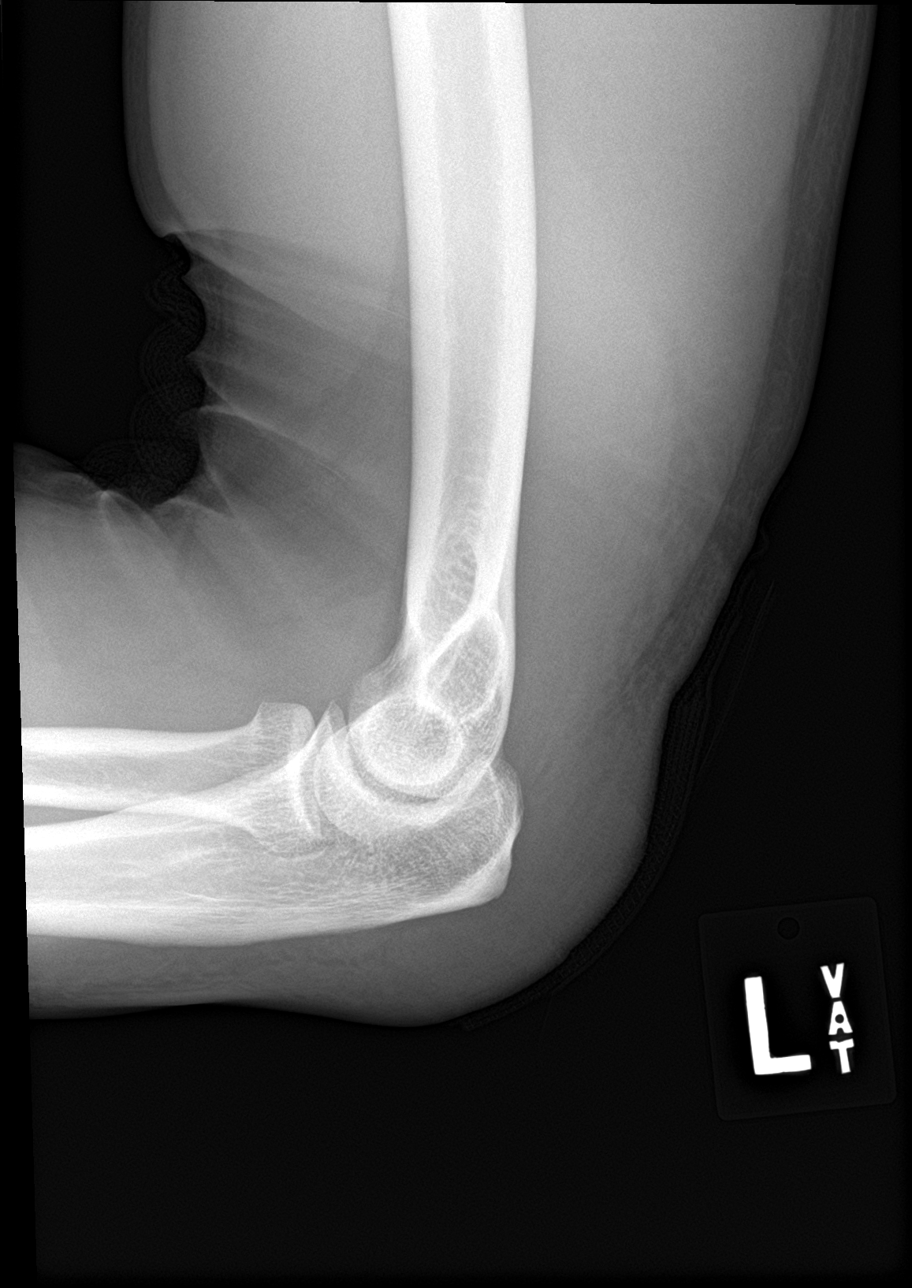

[4 of 4 positions shown; findings below may reference images not displayed]

FINDINGS: There is no evidence of fracture, dislocation, or joint effusion.
There is no evidence of arthropathy or other focal bone abnormality.
Protuberant soft tissue swelling over the olecranon.
IMPRESSION: Protuberant soft tissue swelling over the olecranon. No acute
osseous abnormality.

## 2021-04-08 ENCOUNTER — Other Ambulatory Visit: Payer: Self-pay

## 2021-04-08 ENCOUNTER — Non-Acute Institutional Stay (HOSPITAL_COMMUNITY)
Admission: RE | Admit: 2021-04-08 | Discharge: 2021-04-08 | Disposition: A | Discharge status: Home / Self Care | Payer: Medicaid Other | Source: Ambulatory Visit | Attending: Internal Medicine | Admitting: Internal Medicine

## 2021-04-08 DIAGNOSIS — G35 Multiple sclerosis: Secondary | ICD-10-CM | POA: Insufficient documentation

## 2021-04-08 DIAGNOSIS — Z79899 Other long term (current) drug therapy: Secondary | ICD-10-CM | POA: Insufficient documentation

## 2021-04-08 MED ORDER — SODIUM CHLORIDE 0.9 % IV SOLN
300.0000 mg | Freq: Once | INTRAVENOUS | Status: AC
  Administered 2021-04-08: 300 mg via INTRAVENOUS
  Filled 2021-04-08: qty 15

## 2021-04-08 MED ORDER — NATALIZUMAB 300 MG/15ML IV CONC: 300.0000 mg | INTRAVENOUS | Status: DC

## 2021-04-08 MED ORDER — SODIUM CHLORIDE 0.9 % IV SOLN: INTRAVENOUS | Status: DC | PRN

## 2021-04-08 NOTE — Progress Notes (Signed)
PATIENT CARE CENTER NOTE     Diagnosis: Multiple sclerosis (HCC) (G35); Relapsing remitting multiple sclerosis (HCC) (G35); High risk medication use (L38.101)      Provider: Despina Arias, MD     Procedure: Donnamarie Poag IV     Note: Patient received Tysabri infusion (dose 1 of 12) via PIV. Tolerated infusion well with no adverse reaction. Vital signs stable. Discharge instructions given. Patient declined to stay for the 1 hour post infusion observation. Patient advised to schedule next appointment on 05/06/21. Alert, oriented and ambulatory at discharge.

## 2021-04-13 ENCOUNTER — Ambulatory Visit (HOSPITAL_COMMUNITY)
Admission: EM | Admit: 2021-04-13 | Discharge: 2021-04-13 | Disposition: A | Discharge status: Home / Self Care | Payer: Medicaid Other | Attending: Licensed Clinical Social Worker | Admitting: Licensed Clinical Social Worker

## 2021-04-13 ENCOUNTER — Ambulatory Visit (INDEPENDENT_AMBULATORY_CARE_PROVIDER_SITE_OTHER): Payer: Medicaid Other | Admitting: Nurse Practitioner

## 2021-04-13 ENCOUNTER — Encounter: Payer: Self-pay | Admitting: Nurse Practitioner

## 2021-04-13 ENCOUNTER — Other Ambulatory Visit: Payer: Self-pay

## 2021-04-13 VITALS — BP 128/79 | HR 59 | Temp 98.2°F | Ht 70.0 in | Wt 184.6 lb

## 2021-04-13 DIAGNOSIS — R45850 Homicidal ideations: Secondary | ICD-10-CM | POA: Insufficient documentation

## 2021-04-13 DIAGNOSIS — F4321 Adjustment disorder with depressed mood: Secondary | ICD-10-CM | POA: Insufficient documentation

## 2021-04-13 DIAGNOSIS — Z114 Encounter for screening for human immunodeficiency virus [HIV]: Secondary | ICD-10-CM | POA: Diagnosis not present

## 2021-04-13 DIAGNOSIS — F1920 Other psychoactive substance dependence, uncomplicated: Secondary | ICD-10-CM | POA: Insufficient documentation

## 2021-04-13 DIAGNOSIS — Z79899 Other long term (current) drug therapy: Secondary | ICD-10-CM | POA: Diagnosis not present

## 2021-04-13 DIAGNOSIS — F3170 Bipolar disorder, currently in remission, most recent episode unspecified: Secondary | ICD-10-CM

## 2021-04-13 DIAGNOSIS — Z0000 Encounter for general adult medical examination without abnormal findings: Secondary | ICD-10-CM | POA: Diagnosis not present

## 2021-04-13 DIAGNOSIS — Z13220 Encounter for screening for lipoid disorders: Secondary | ICD-10-CM

## 2021-04-13 DIAGNOSIS — G709 Myoneural disorder, unspecified: Secondary | ICD-10-CM

## 2021-04-13 DIAGNOSIS — Z113 Encounter for screening for infections with a predominantly sexual mode of transmission: Secondary | ICD-10-CM

## 2021-04-13 DIAGNOSIS — G35 Multiple sclerosis: Secondary | ICD-10-CM

## 2021-04-13 LAB — POCT URINALYSIS DIP (CLINITEK)
Bilirubin, UA: NEGATIVE
Glucose, UA: NEGATIVE mg/dL
Ketones, POC UA: NEGATIVE mg/dL
Nitrite, UA: NEGATIVE
Spec Grav, UA: >=1.03 — AB (ref 1.010–1.025)
Urobilinogen, UA: 0.2 E.U./dL
pH, UA: 6 (ref 5.0–8.0)
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
## 2021-04-13 NOTE — Patient Instructions (Signed)
Substance Use Disorder and Mental Illness Substance use disorder is a condition in which a person is dependent on a substance, such as drugs or alcohol. A mental illness is a condition that occurs when someone experiences changes in mood, behavior, or thinking. Sometimes, these two conditions can occur at the same time (co-occurring disorders) and may be diagnosed together (dual diagnosis). What is the relationship between substance use disorder and mental illness? Substance use disorder and mental illness can share symptoms and can have similar causes, such as exposure to stress or changes in brain chemicals. The risk for developing both of these conditions can be passed from parent to child (inherited). People with mental illnesses sometimes use drugs to try to relieve symptoms, and this can lead to substance use disorder. Substance use disorders occur more often in people who have depression, schizophrenia, anxiety, or personality disorders. Also, when some drugs are used regularly, they can cause people to have symptoms of mental illness. What are the signs or symptoms? Symptoms vary widely for substance use disorder and mental illness, especially because these conditions can be present at the same time. Signs of a substance use disorder Failure to meet responsibilities at home, work, or school. Using substances in risky situations, such as while driving or using machinery. Taking serious risks to get drugs or alcohol. Spending less time on activities or hobbies that used to be important. Changes in personality or attitude for no reason, such as angry outbursts, symptoms of anxiety, or unusual giddiness. Trying to hide the amount of drugs or alcohol used. Increased substance use over time, or needing to use more of a substance to feel the same effects (developing a tolerance). Physical symptoms may include: Sudden weight loss or gain. Sleeping too much or too little. Uncontrolled trembling or  shaking (tremors), slurred speech, or lack of coordination. Continuing to use alcohol or a drug even though using it has led to bad outcomes or consequences, such as losing a job or ending a relationship. Signs and symptoms of mental illness Withdrawing from friends and family, or sudden changes in social behaviors or hobbies. Aggression toward people and animals. Repeatedly breaking serious rules or breaking the law. Persistent feelings of sadness, hopelessness, or thoughts of suicide. Having persistent thoughts or urges that are unpleasant or feel out of control (involuntary). The person may feel the need to act on the urges in order to reduce anxiety. False beliefs (delusions). Seeing, hearing, tasting, smelling, or feeling things that are not real (hallucinations). Other signs include: Sleeping too much or too little. Weight loss or weight gain. Being easily distracted. Extreme mood changes (mood swings). Confused thinking or trouble concentrating. How is this diagnosed? Substance use disorder and mental illnesses can be difficult to diagnose at the same time because of how varied and complex the symptoms are. Because these conditions can interact, one condition may be missed. This is why it is important to be completely honest with your health care provider about substance use and your other symptoms. Diagnosing your condition may include: A physical exam. A review of your medical history and your symptoms. Your health care provider may refer you to a mental health professional for a psychiatric evaluation. This may include assessments of: Your use of substances. Your risk of suicide. Your risk of aggressive behaviors. Your lifestyle, environment, and social situations. Your medical health. Your mental health and behavioral history. How is this treated? It is best to treat substance use disorder and mental illness at the same  time (integrated treatment approach). Treatment usually  involves more than one of the following methods: Detox. This refers to stopping substance abuse while being monitored by trained medical staff. This is usually the first step in treatment. Detox can last for up to 7 days. Rehabilitation. This involves staying in a treatment center where you can have medical and mental health support all the time. Medicines to relieve symptoms of mental illness and to control symptoms that are caused by stopping substance abuse (withdrawal symptoms). Support groups. These groups encourage you to talk about your fears, frustrations, and anxieties with others who have the same condition. Talk therapy. This is one-on-one therapy that can help you learn about your illness and learn ways to cope with symptoms or side effects. Follow these instructions at home: Medicines Take over-the-counter and prescription medicines only as told by your health care provider. Do not stop taking medicines unless you ask your health care provider if it is safe to do that. Tell your health care provider about any medicine side effects that you experience. Lifestyle Exercise regularly. Aim for 150 minutes of moderate exercise (such as walking or biking) or 75 minutes of vigorous exercise (such as running) each week. Eat a healthy diet with plenty of fruits and vegetables, whole grains, and lean proteins. Avoid caffeine and tobacco. These can worsen symptoms and anxiety. Do not drink alcohol or use drugs. Try to get 7-9 hours of sleep each night. To do this: Keep your bedroom cool and dark. Do not eat a heavy meal during the hour before you go to bed. Do not have caffeine before bedtime. Avoid screen time during the few hours before bedtime. This means not watching TV and not using a computer, mobile phone, or tablet. General instructions Follow your treatment plan as directed. Work with your health care provider to adjust your treatment plan as needed. Attend support group or therapy  sessions as directed. Explain your diagnosis to your friends and family. Let them know what your symptoms are and what things cause your symptoms to start (triggers). Avoid triggers or stressors that may worsen your symptoms or cause you to use alcohol or drugs. Spend time with family and friends who do not use substances. Make time to relax and do self-soothing activities, such as meditating or listening to music. Keep all follow-up visits. This is important. Where to find support You may find support for coping with substance use disorder and mental illness from: Your health care providers or your therapist. These providers can treat you or can help you find services to treat your condition. Local support groups for people with your condition. Your health care provider or therapist may be able to recommend a support group. This may be a hospital support group, a [employer] on Mental Illness (NAMI) support group, or a 12-step group such as Alcoholics Anonymous (AA) or Narcotics Anonymous (NA). Family and friends. Let them know what they can do to best support you through your recovery process. Where to find more information Substance Abuse and Mental Health Services [facility]): Online: Skate[employer]asis[employer], to talk with a person who can help you find information about treatment services in your area: (757)814-7722 (806)800-4049) [place] Department of Health and Health and safety inspector mental health services: https://www.vaughan-marshall.com/ National Alliance on Mental Illness (NAMI): www.nami.org Contact a health care provider if: Your symptoms get worse or they do not get better. You have negative side effects from taking medicines. You want to discuss stopping medicines or treatment. You  start using a substance again (have a relapse). Get help right away if: You have thoughts about harming yourself or others. If you ever feel like you may hurt yourself or others, or have thoughts  about taking your own life, get help right away. Go to your nearest emergency department or: Call your local emergency services (911 in the [place] Call a suicide crisis helpline, such as the National Suicide Prevention Lifeline at 9346918893. This is open 24 hours a day in the [place] Text the Crisis Text Line at (559) 051-4549 (in the [place] Summary Substance use disorder and mental illness can occur at the same time (co-occurring disorders), and they may be diagnosed together (dual diagnosis). These conditions can be difficult to diagnose at the same time. It is important to be completely honest with your health care provider about your substance use and your other symptoms. It is best to treat substance use disorder and mental illness at the same time (integrated treatment approach). Identifying new ways to deal with triggers and difficult situations is an important part of recovery. Keep all follow-up visits. Attend support groups or treatment programs as directed. This information is not intended to replace advice given to you by your health care provider. Make sure you discuss any questions you have with your health care provider. Document Revised: 10/01/2020 Document Reviewed: 10/01/2020 Elsevier Patient Education  2022 [employer].

## 2021-04-13 NOTE — Progress Notes (Signed)
Integrated Behavioral Health Behavioral Health Screening    04/13/2021 Name: Bradley Brandt MRN: [id number] DOB: 03/15/1981 Bradley Brandt is a 40 y.o. year old male who sees Barbette Merino, NP for primary care. LCSW was consulted to assess mental health needs and assist the patient with Mental Health Counseling and Resources. Assessed thoughts of SI, plan and access to means.  Interpreter: No.   Interpreter Name & Language: none  SUBJECTIVE: Presenting issue / symptoms/concerns: homicidal ideations, anxiety, panic attacks Duration of symptoms/ how impacting : negatively impacting relationships Recent life changes: interpersonal problems Family / Social support: father, mother  Mood: Anxious Affect: Appropriate  OBJECTIVE:  Psychiatric History - Diagnoses: bipolar disorder, GAD - Hospitalizations/ prior attempts:  - Pharmacotherapy:  - Outpatient therapy: none currently Family history of psychiatric issues:   Current and history of substance use:  history of cocaine use, currently marijuana and alcohol  PHQ-9 score of 27 is an indication of severe depression. Depression screen PHQ 2/9 04/13/2021  Decreased Interest 3  Down, Depressed, Hopeless 3  PHQ - 2 Score 6  Altered sleeping 3  Tired, decreased energy 3  Change in appetite 3  Feeling bad or failure about yourself  3  Trouble concentrating 3  Moving slowly or fidgety/restless 3  Suicidal thoughts 3  PHQ-9 Score 27  Difficult doing work/chores Extremely dIfficult  Some encounter information is confidential and restricted. Go to Review Flowsheets activity to see all data.     No flowsheet data found.  Outpatient Encounter Medications as of 04/13/2021  Medication Sig Note   acetaminophen (TYLENOL) 500 MG tablet Take 500 mg by mouth 2 (two) times daily.    baclofen (LIORESAL) 20 MG tablet Take 1 tablet (20 mg total) by mouth 3 (three) times daily.    dalfampridine 10 MG TB12 Take 1 tablet by mouth every 12 hours.     escitalopram (LEXAPRO) 10 MG tablet Take 1 tablet (10 mg total) by mouth daily.    gabapentin (NEURONTIN) 800 MG tablet Take 1 tablet (800 mg total) by mouth daily.    hydrOXYzine (ATARAX/VISTARIL) 50 MG tablet Take 1 tablet (50 mg total) by mouth 3 (three) times daily as needed.    natalizumab (TYSABRI) 300 MG/15ML injection Inject 15 mLs (300 mg total) into the vein every 28 (twenty-eight) days. 06/01/2020: JCV drawn on 05/26/20 negative, index: 0.16   QUEtiapine (SEROQUEL) 100 MG tablet Take 1.5 tablets (150 mg total) by mouth at bedtime.    tamsulosin (FLOMAX) 0.4 MG CAPS capsule Take 2 capsules (0.8 mg total) by mouth daily.    Vitamin D, Ergocalciferol, (DRISDOL) 1.25 MG (50000 UNIT) CAPS capsule TAKE 1 CAPSULE (50,000 UNITS TOTAL) BY MOUTH EVERY 7 (SEVEN) DAYS.    methylPREDNISolone (MEDROL DOSEPAK) 4 MG TBPK tablet 6 pills po x 1 day, then 5 pills po x 1 day, then 4 pills po x 1 day, then 3 pills po x 1 day, then 2 pills po x 1 day, then 1 pill po x 1 day (Patient not taking: Reported on 04/13/2021)    No facility-administered encounter medications on file as of 04/13/2021.    Review of patient status, including review of consultants reports, relevant laboratory and other test results, and collaboration with appropriate care team members and the patient's provider was performed as part of comprehensive patient evaluation and provision of services.    Assessment: Patient is currently experiencing symptoms of anxiety, depression, and stress which are exacerbated by social and interpersonal problems.  Recommendation: Patient may benefit from, and is in agreement to go to [facility]) urgent care for assessment today. He would also like to follow up with his old psychiatrist at [facility]. He does not remember the last time he saw them.   Intervention:Patient interviewed and appropriate assessments performed: brief mental  health assessment Patient interviewed and appropriate assessments performed Patient reports thoughts of harming someone. He does not have a plan to do this, but is distressed about an interaction with this person. He uses marijuana and alcohol. He reports this causes issues in his relationship. He has panic attacks. He feels like "everything is coming down on me." He wants to see his old psychiatrist at [facility]. CSW called [facility] with patient and left message for scheduler requesting call back to schedule new appointment. Patient is also agreeable to go to [facility] today for assessment. Patient's father, Taris Galindo drove patient to appointment here at [facility]) today. CSW spoke with father and he is agreeable to bring patient directly to [facility] from [facility] today. Provided [facility] contact information and location. CSW called [facility] urgent care and advised that patient is coming in today.   SDOH (Social Determinants of Health) assessments performed: No    Goals Addressed   None     Follow Up Plan:  Parents to drive patient to [facility] from [facility] today 2.  CSW to assist patient in following up with [facility] for appointment  Abigail Butts, LCSW [facility] [phone]
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
## 2021-04-13 NOTE — ED Notes (Signed)
Pt discharged with  AVS.  AVS reviewed prior to discharge.  Pt alert, oriented, and ambulatory.  Safety maintained.  °

## 2021-04-13 NOTE — Discharge Instructions (Addendum)
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
## 2021-04-14 LAB — CBC WITH DIFFERENTIAL/PLATELET
Basophils Absolute: 0.1 10*3/uL (ref 0.0–0.2)
Basos: 1 %
EOS (ABSOLUTE): 0.2 10*3/uL (ref 0.0–0.4)
Eos: 2 %
Hematocrit: 46.9 % (ref 37.5–51.0)
Hemoglobin: 15.9 g/dL (ref 13.0–17.7)
Immature Grans (Abs): 0 10*3/uL (ref 0.0–0.1)
Immature Granulocytes: 0 %
Lymphocytes Absolute: 4.2 10*3/uL — ABNORMAL HIGH (ref 0.7–3.1)
Lymphs: 46 %
MCH: 31.5 pg (ref 26.6–33.0)
MCHC: 33.9 g/dL (ref 31.5–35.7)
MCV: 93 fL (ref 79–97)
Monocytes Absolute: 0.6 10*3/uL (ref 0.1–0.9)
Monocytes: 6 %
NRBC: 1 % — ABNORMAL HIGH (ref 0–0)
Neutrophils Absolute: 4.1 10*3/uL (ref 1.4–7.0)
Neutrophils: 45 %
Platelets: 183 10*3/uL (ref 150–450)
RBC: 5.04 x10E6/uL (ref 4.14–5.80)
RDW: 14 % (ref 11.6–15.4)
WBC: 9.1 10*3/uL (ref 3.4–10.8)

## 2021-04-14 LAB — COMP. METABOLIC PANEL (12)
AST: 19 IU/L (ref 0–40)
Albumin/Globulin Ratio: 2.1 (ref 1.2–2.2)
Albumin: 4.9 g/dL (ref 4.0–5.0)
Alkaline Phosphatase: 90 IU/L (ref 44–121)
BUN/Creatinine Ratio: 16 (ref 9–20)
BUN: 19 mg/dL (ref 6–24)
Bilirubin Total: 0.4 mg/dL (ref 0.0–1.2)
Calcium: 9.9 mg/dL (ref 8.7–10.2)
Chloride: 101 mmol/L (ref 96–106)
Creatinine, Ser: 1.18 mg/dL (ref 0.76–1.27)
Globulin, Total: 2.3 g/dL (ref 1.5–4.5)
Glucose: 90 mg/dL (ref 70–99)
Potassium: 4.8 mmol/L (ref 3.5–5.2)
Sodium: 141 mmol/L (ref 134–144)
Total Protein: 7.2 g/dL (ref 6.0–8.5)
eGFR: 79 mL/min/{1.73_m2} (ref >59)

## 2021-04-14 LAB — LIPID PANEL
Chol/HDL Ratio: 4.7 ratio (ref 0.0–5.0)
Cholesterol, Total: 173 mg/dL (ref 100–199)
HDL: 37 mg/dL — ABNORMAL LOW (ref >39)
LDL Chol Calc (NIH): 114 mg/dL — ABNORMAL HIGH (ref 0–99)
Triglycerides: 119 mg/dL (ref 0–149)
VLDL Cholesterol Cal: 22 mg/dL (ref 5–40)

## 2021-04-14 LAB — HIV ANTIBODY (ROUTINE TESTING W REFLEX): HIV Screen 4th Generation wRfx: NONREACTIVE

## 2021-04-15 ENCOUNTER — Telehealth (HOSPITAL_COMMUNITY): Payer: Self-pay | Admitting: Nurse Practitioner

## 2021-04-15 NOTE — BH Assessment (Signed)
Care Management - [facility] Up [facility]   Writer made contact with the patient. Patient reports that he has a follow up appointment with, LCSW Isac Caddy on 05-05-21

## 2021-04-16 LAB — CHLAMYDIA/GONOCOCCUS/TRICHOMONAS, NAA
Chlamydia by NAA: NEGATIVE
Gonococcus by NAA: NEGATIVE
Trich vag by NAA: NEGATIVE

## 2021-04-18 ENCOUNTER — Telehealth: Payer: Self-pay | Admitting: Clinical

## 2021-04-18 NOTE — Telephone Encounter (Signed)
[facility]ated [facility]al [facility] General Follow Up Note  04/18/2021 Name: Bradley Brandt MRN: [id number] DOB: 1981-04-28 [name] is a 40 y.o. year old male who sees Barbette Merino, NP for primary care. LCSW was initially consulted to assist the patient with Mental Health Counseling and Resources.  Interpreter: No.   Interpreter Name & Language: none  Assessment: Patient experiencing mental health concerns.  Ongoing Intervention: Today CSW called patient to follow up on encounter from last week. At visit last week, patient reported homicidal ideation. CSW left message with [facility] requesting to schedule patient, as he was previously seen by their providers. His parents brought him to to [facility]) following PCP appointment. Today patient reported he received follow up from [facility] and is scheduled with them on 04/26/21. Advised patient to call [facility]) with additional questions or needs.   Review of patient status, including review of consultants reports, relevant laboratory and other test results, and collaboration with appropriate care team members and the patient's provider was performed as part of comprehensive patient evaluation and provision of services.    Abigail Butts, LCSW [facility] [phone]

## 2021-04-19 LAB — URINE CULTURE

## 2021-04-26 DIAGNOSIS — F330 Major depressive disorder, recurrent, mild: Secondary | ICD-10-CM | POA: Diagnosis not present

## 2021-04-26 DIAGNOSIS — F411 Generalized anxiety disorder: Secondary | ICD-10-CM | POA: Diagnosis not present

## 2021-05-05 ENCOUNTER — Ambulatory Visit (INDEPENDENT_AMBULATORY_CARE_PROVIDER_SITE_OTHER): Payer: Medicaid Other | Admitting: Licensed Clinical Social Worker

## 2021-05-05 DIAGNOSIS — F411 Generalized anxiety disorder: Secondary | ICD-10-CM

## 2021-05-06 ENCOUNTER — Non-Acute Institutional Stay (HOSPITAL_COMMUNITY)
Admission: RE | Admit: 2021-05-06 | Discharge: 2021-05-06 | Disposition: A | Discharge status: Home / Self Care | Payer: Medicaid Other | Source: Ambulatory Visit | Attending: Internal Medicine | Admitting: Internal Medicine

## 2021-05-06 ENCOUNTER — Other Ambulatory Visit: Payer: Self-pay

## 2021-05-06 DIAGNOSIS — G35 Multiple sclerosis: Secondary | ICD-10-CM | POA: Diagnosis not present

## 2021-05-06 DIAGNOSIS — Z79899 Other long term (current) drug therapy: Secondary | ICD-10-CM | POA: Diagnosis not present

## 2021-05-06 MED ORDER — SODIUM CHLORIDE 0.9 % IV SOLN: INTRAVENOUS | Status: DC | PRN

## 2021-05-06 MED ORDER — SODIUM CHLORIDE 0.9 % IV SOLN
300.0000 mg | INTRAVENOUS | Status: DC
  Administered 2021-05-06: 300 mg via INTRAVENOUS
  Filled 2021-05-06: qty 15

## 2021-05-06 NOTE — Progress Notes (Signed)
PATIENT CARE CENTER NOTE     Diagnosis: Multiple sclerosis (HCC) (G35); Relapsing remitting multiple sclerosis (HCC) (G35); High risk medication use (A35.573)      Provider: Despina Arias, MD     Procedure: Donnamarie Poag IV     Note: Patient received Tysabri infusion (dose 2 of 12) via PIV. Tolerated infusion well with no adverse reaction. Vital signs stable. Discharge instructions given. Patient declined to stay for the 1 hour post infusion observation. Patient advised to schedule next appointment in 28 days. Alert, oriented and ambulatory at discharge.

## 2021-05-09 NOTE — Progress Notes (Signed)
   THERAPIST PROGRESS NOTE   Virtual Visit via Telephone Note  I connected with Bradley Brandt on 05/05/21 at  9:00 AM EST by telephone and verified that I am speaking with the correct person using two identifiers.  Location: Patient: Home Provider: [facility]   I discussed the limitations, risks, security and privacy concerns of performing an evaluation and management service by telephone and the availability of in person appointments. I also discussed with the patient that there may be a patient responsible charge related to this service. The patient expressed understanding and agreed to proceed. I discussed the assessment and treatment plan with the patient. The patient was provided an opportunity to ask questions and all were answered. The patient agreed with the plan and demonstrated an understanding of the instructions.   The patient was advised to call back or seek an in-person evaluation if the symptoms worsen or if the condition fails to improve as anticipated.  I provided 20 minutes of non-face-to-face time during this encounter.  Participation Level: Active  Behavioral Response:  UTALethargicAnxious and Dysphoric  Type of Therapy: Individual Therapy  Treatment Goals addressed: Anxiety and Coping  Interventions: Supportive and Other: additional assessment  Summary: Bradley Brandt is a 40 y.o. male who presents with hx of GAD.  Today patient answers phone for telephone appointment per schedule.  Patient's last session was May 2 of this year.  LCSW assessed for significant changes since last session.  Patient states "I have been going through it".  When this comment was explored further, patient speaks in depth about his girlfriend being "crazy".  Patient reports he had a nurse that was helping him with in-home services but his girlfriend got her fired.  Patient reports the nurse brought him some food and his girlfriend got jealous and reported her.  Assessment of what is going well  in the relationship reveals patient saying "sex". Assisted pt to problem solve his choices and priorities in the relationship.  LCSW assessed for patient's level of mobility at this time related to his MDS diagnosis.  Patient reports he thinks his mobility is improved.  LCSW assessed for patient's follow-through on getting connected with the [employer].  Patient reports he did not do this, information and education provided again.  LCSW assessed for status of patient's medications.  Patient reports he is out of medications and behind on bills.  Kathlene November states his father stopped helping him financially "a few months ago".  LCSW provided referral and education on medication assistance programs.  Patient advised that [employer] might help him to secure medications while he is applying for ongoing assistance.  Patient states he feels he can follow-up on these items.  Patient advises his disability income is only $500 a month and he is not able to make ends meet.  LCSW attempted to make referral post session to adult services unit at [facility] with patient's agreement to assist him with ongoing case management.  Program manager at DSS states they do not have a program of this type at present. LCSW reviewed poc with pt including scheduling prior to close of session. Pt states appreciation for care.   Suicidal/Homicidal: Nowithout intent/plan  Therapist Response: Pt somewhat receptive to care.  Plan: Return again for next avail appt. Wants phone sessions.  Diagnosis: Axis I: Generalized Anxiety Disorder  [place], LCSW 05/09/2021

## 2021-05-17 ENCOUNTER — Telehealth: Payer: Self-pay | Admitting: Neurology

## 2021-05-17 ENCOUNTER — Ambulatory Visit: Payer: Medicaid Other | Admitting: Neurology

## 2021-05-17 ENCOUNTER — Encounter: Payer: Self-pay | Admitting: Neurology

## 2021-05-17 VITALS — BP 135/70 | HR 63 | Ht 71.0 in | Wt 190.0 lb

## 2021-05-17 DIAGNOSIS — F319 Bipolar disorder, unspecified: Secondary | ICD-10-CM | POA: Diagnosis not present

## 2021-05-17 DIAGNOSIS — R261 Paralytic gait: Secondary | ICD-10-CM | POA: Diagnosis not present

## 2021-05-17 DIAGNOSIS — G35 Multiple sclerosis: Secondary | ICD-10-CM

## 2021-05-17 DIAGNOSIS — Z79899 Other long term (current) drug therapy: Secondary | ICD-10-CM | POA: Diagnosis not present

## 2021-05-17 DIAGNOSIS — G4709 Other insomnia: Secondary | ICD-10-CM | POA: Diagnosis not present

## 2021-05-17 MED ORDER — QUETIAPINE FUMARATE 100 MG PO TABS: 150.0000 mg | ORAL_TABLET | Freq: Every day | ORAL | 1 refills | Status: DC

## 2021-05-17 NOTE — Progress Notes (Signed)
[name]  PATIENT: Bradley Brandt DOB: 02/17/1981  REFERRING DOCTOR OR PCP:  Thomes Dinning SOURCE: patient, records form Dr. Gaynelle Adu, MRI reports and images on PACS  _________________________________   HISTORICAL  CHIEF COMPLAINT:  Chief Complaint  Patient presents with   Follow-up    RM 1, alone. Last seen 11/16/20. MS-Tysabri. Tolerating well. Ambulates with cane. No falls since last seen.    HISTORY OF PRESENT ILLNESS:  Bradley Brandt is a 40 y.o.  man with relapsing remitting multiple sclerosis.     Update 05/16/2021:   He is on Tysabri and gets infusions at [facility].   He tolerates it well.   Last JCV Ab was negative at 0.16 on 05/26/2020.    He feels he is stable for the most part.    He has trouble words out and has a mildly slurred speech.   He has had that x several years byt feels it is worse.    He actually feels his gait has done better.   He is using a cane but can walk 100 feet or more without it.   He has a left  foot drop and spastic gait.    This is better than last year.    He denies arm weakness or spasticity   No numbness or tingling   Bladder function is the same with some urgency as well as hesitancy (helped by tamsulosin)     He is on baclofen for spasticity and dalfampridine for the walking.     Vision is doing the same.   He feel she needs new glasses      He feels fatigued but this is stable.   He notes some depression, similar to earlier in the year.   He has more anxiety.   He is seeing psychiatry again.   He was diagnosed with bipolar disease   He is sleeping worse.[name]   He notes cognition is about the same.  Focus is sometimes poor.          MS history: Abhiraj was diagnosed around 2001 when he presented with right-sided numbness and clumsiness and weakness.   Initially, he was on Avonex and then Rebif but had relapses on both of them.  He could not tolerate Tecfidera.   He was started on Tysabri about 1 year ago and his  last infusion was at [facility] at 11/08/2015.  He was seen Dr. Gaynelle Adu [facility] and those records were reviewed. He did receive a course of Solu-Medrol March 2015 and another course of Solu-Medrol January 2016 when he presented with left leg weakness and numbness that was new    An MRI of the brain 08/28/2013 personally reviewed shows old extensive white matter changes in both hemispheres with multiple sclerosis. There were new areas of restricted diffusion without definite enhancement that could be consistent with ongoing demyelination when compared to the MRI from 05/13/2013.  Imaging: MRI of the brain 08/30/2019 showed multiple T2/FLAIR hyperintense foci in the hemispheres, pons, cerebellum and thalamus in a pattern and configuration consistent with chronic demyelinating plaque associated with multiple sclerosis.  None of the foci appear to be acute and they do not enhance.  Compared to the MRI dated 01/14/2019, there is no interval progression.  MRI of the cervical spine 01/06/2018 showed T2 hyperintense foci within the spinal cord posteriorly adjacent to C2, to the right adjacent to C3, to the left adjacent to C4 and centrally and to the  right adjacent to C4-C5 and C5.   None of these appear to be acute and they were all present on the 2015 MRI.[name]    MRI of the brain 01/06/2018 showed multiple T2/FLAIR hyperintense foci in the hemispheres and in the left middle cerebellar peduncle consistent with chronic demyelinating plaque associated with multiple sclerosis.  None of the foci appears to be acute.  None of them enhance.  However, when compared to the 2015 MRI focus in the cerebellum and a couple foci in the hemispheres were not clearly present on the earlier MRI.     REVIEW OF SYSTEMS: Constitutional: No fevers, chills, sweats, or change in appetite.   Notes fatigue Eyes: No visual changes, double vision, eye pain Ear, nose and throat: No hearing loss, ear pain, nasal  congestion, sore throat Cardiovascular: No chest pain, palpitations Respiratory:  No shortness of breath at rest or with exertion.   No wheezes GastrointestinaI: No nausea, vomiting, diarrhea, abdominal pain, fecal incontinence Genitourinary:  He has hesitancy and frequency.   No UTI's. Musculoskeletal:  No neck pain, back pain Integumentary: No rash, pruritus, skin lesions Neurological: as above Psychiatric: No depression at this time.  No anxiety Endocrine: No palpitations, diaphoresis, change in appetite, change in weigh or increased thirst Hematologic/Lymphatic:  No anemia, purpura, petechiae. Allergic/Immunologic: No itchy/runny eyes, nasal congestion, recent allergic reactions, rashes  ALLERGIES: Allergies  Allergen Reactions   Lithium Rash    HOME MEDICATIONS:  Current Outpatient Medications:    acetaminophen (TYLENOL) 500 MG tablet, Take 500 mg by mouth 2 (two) times daily., Disp: , Rfl:    baclofen (LIORESAL) 20 MG tablet, Take 1 tablet (20 mg total) by mouth 3 (three) times daily., Disp: 90 tablet, Rfl: 11   dalfampridine 10 MG TB12, Take 1 tablet by mouth every 12 hours., Disp: 60 tablet, Rfl: 11   escitalopram (LEXAPRO) 10 MG tablet, Take 1 tablet (10 mg total) by mouth daily., Disp: 30 tablet, Rfl: 1   gabapentin (NEURONTIN) 800 MG tablet, Take 1 tablet (800 mg total) by mouth daily., Disp: 30 tablet, Rfl: 2   hydrOXYzine (ATARAX/VISTARIL) 50 MG tablet, Take 1 tablet (50 mg total) by mouth 3 (three) times daily as needed., Disp: 90 tablet, Rfl: 1   methylPREDNISolone (MEDROL DOSEPAK) 4 MG TBPK tablet, 6 pills po x 1 day, then 5 pills po x 1 day, then 4 pills po x 1 day, then 3 pills po x 1 day, then 2 pills po x 1 day, then 1 pill po x 1 day, Disp: 21 tablet, Rfl: 0   natalizumab (TYSABRI) 300 MG/15ML injection, Inject 15 mLs (300 mg total) into the vein every 28 (twenty-eight) days., Disp: 15 mL, Rfl: 5   tamsulosin (FLOMAX) 0.4 MG CAPS capsule, Take 2 capsules (0.8 mg  total) by mouth daily., Disp: 60 capsule, Rfl: 11   Vitamin D, Ergocalciferol, (DRISDOL) 1.25 MG (50000 UNIT) CAPS capsule, TAKE 1 CAPSULE (50,000 UNITS TOTAL) BY MOUTH EVERY 7 (SEVEN) DAYS., Disp: 12 capsule, Rfl: 4   QUEtiapine (SEROQUEL) 100 MG tablet, Take 1.5 tablets (150 mg total) by mouth at bedtime., Disp: 30 tablet, Rfl: 1  PAST MEDICAL HISTORY: Past Medical History:  Diagnosis Date   Bipolar 1 disorder (HCC)    Bipolar disorder, unspecified (HCC) 01/03/2013   MS (multiple sclerosis) (HCC) dx 2006   relapsing-remitting right sided weakness   Neuromuscular disorder (HCC)    MS    PAST SURGICAL HISTORY: Past Surgical History:  Procedure Laterality Date  TUMOR REMOVAL  1983   abdomen benign    FAMILY HISTORY: Family History  Adopted: Yes  Problem Relation Age of Onset   Lupus Mother    Ataxia Neg Hx    Chorea Neg Hx    Dementia Neg Hx    Mental retardation Neg Hx    Migraines Neg Hx    Multiple sclerosis Neg Hx    Neurofibromatosis Neg Hx    Neuropathy Neg Hx    Parkinsonism Neg Hx    Seizures Neg Hx    Stroke Neg Hx     SOCIAL HISTORY:  Social History   Socioeconomic History   Marital status: Single    Spouse name: Not on file   Number of children: Not on file   Years of education: Not on file   Highest education level: Not on file  Occupational History   Not on file  Tobacco Use   Smoking status: Every Day    Packs/day: 1.00    Types: Cigarettes   Smokeless tobacco: Never  Vaping Use   Vaping Use: Former  Substance and Sexual Activity   Alcohol use: Yes    Alcohol/week: 14.0 - 21.0 standard drinks    Types: 14 - 21 Shots of liquor per week   Drug use: Yes    Frequency: 7.0 times per week    Types: Marijuana    Comment: daily   Sexual activity: Yes    Partners: Female    Birth control/protection: None  Other Topics Concern   Not on file  Social History Narrative   Not on file   Social Determinants of Health   Financial Resource  Strain: Not on file  Food Insecurity: Not on file  Transportation Needs: Not on file  Physical Activity: Not on file  Stress: Not on file  Social Connections: Not on file  Intimate Partner Violence: Not on file     PHYSICAL EXAM  Vitals:   05/17/21 1255  BP: 135/70  Pulse: 63  Weight: 190 lb (86.2 kg)  Height: 5\' 11"  (1.803 m)    Body mass index is 26.5 kg/m.   General: The patient is well-developed and well-nourished and in no acute distress.   Neurologic Exam   Mental status: The patient is alert and oriented x 3 at the time of the examination. The patient has apparent normal recent and remote memory, with an apparently normal attention span and concentration ability.   Speech is normal.   Cranial nerves: Extraocular movements are full.   2+ right APD.   Reduced visual acuity and reduced color vision OD     Facial strength is normal. Trapezius strength is strong.  No obvious hearing deficits are noted.   Motor:  Muscle bulk is normal.   Left rapid altering movements are slow in the hand.  There is increased muscle tone in the left arm and both legs  Strength is 5/5 in the right arm, 4+/5 in the left arm.  And 4- to 4/5 in the right leg and 4/5 in the left leg.   Sensory: He reports symmetric sensation now to touch an vibration      Coordination: Cerebellar testing reveals mildly reduced finger-nose-finger and mildly ataxic heel-to-shin bilaterally, worse on left   Gait and station: Station is normal.   He has a spastic gait .  Mild left foot drop.  He can take steps without his cane but walks faster with it.    He cannot tandem walk.  Romberg is borderline   Reflexes: Deep tendon reflexes are increased with spread at the knees.  He has nonsustained clonus at the ankles       ____________________________________________  MS (multiple sclerosis) (HCC) - Plan: Stratify JCV Antibody Test ([facility]), CBC with Differential/Platelet, MR BRAIN WO CONTRAST, MR CERVICAL SPINE WO  CONTRAST  Bipolar affective disorder, remission status unspecified (HCC) - Plan: QUEtiapine (SEROQUEL) 100 MG tablet  Other insomnia - Plan: QUEtiapine (SEROQUEL) 100 MG tablet  High risk medication use  Spastic gait - Plan: Stratify JCV Antibody Test ([facility]), CBC with Differential/Platelet, MR BRAIN WO CONTRAST, MR CERVICAL SPINE WO CONTRAST  1.   Continue Tysabri  .Check JCV Ab CBCwith diff today.   Check MRI brain and cervical spine to determine if any breakthrough activity and consider a change in therapy if occurring. 2.   Continue Ampyra 10 mg twice daily for gait and baclofen for spasticity 3.   Continue gabapentin for dysesthesia and back pain. 4.   Stay active.   Exercise as tolerated 5.   He is out of Seroquel.   He has seen behavioral health. I will refill but advised him to get back to behavioral health. For long term psychiatric follow up.   6.   Return in 6 months or sooner if new or worsening neurologic symptoms.    Rawlin Reaume A. Epimenio Foot, MD, PhD 05/17/2021, 1:29 PM Certified in Neurology, Clinical Neurophysiology, Sleep Medicine, Pain Medicine and Neuroimaging  [facility] [address], [address] [place], [place] [address] [phone]

## 2021-05-17 NOTE — Telephone Encounter (Signed)
Pl[facility] lab in [facility] lock box for routine lab pick up. Results pending. 

## 2021-05-18 ENCOUNTER — Telehealth: Payer: Self-pay | Admitting: Neurology

## 2021-05-18 DIAGNOSIS — G35 Multiple sclerosis: Secondary | ICD-10-CM | POA: Diagnosis not present

## 2021-05-18 LAB — CBC WITH DIFFERENTIAL/PLATELET
Basophils Absolute: 0.1 10*3/uL (ref 0.0–0.2)
Basos: 1 %
EOS (ABSOLUTE): 0.2 10*3/uL (ref 0.0–0.4)
Eos: 2 %
Hematocrit: 43.4 % (ref 37.5–51.0)
Hemoglobin: 14.8 g/dL (ref 13.0–17.7)
Immature Grans (Abs): 0.1 10*3/uL (ref 0.0–0.1)
Immature Granulocytes: 1 %
Lymphocytes Absolute: 3.7 10*3/uL — ABNORMAL HIGH (ref 0.7–3.1)
Lymphs: 37 %
MCH: 31.6 pg (ref 26.6–33.0)
MCHC: 34.1 g/dL (ref 31.5–35.7)
MCV: 93 fL (ref 79–97)
Monocytes Absolute: 0.7 10*3/uL (ref 0.1–0.9)
Monocytes: 7 %
NRBC: 1 % — ABNORMAL HIGH (ref 0–0)
Neutrophils Absolute: 5.3 10*3/uL (ref 1.4–7.0)
Neutrophils: 52 %
Platelets: 181 10*3/uL (ref 150–450)
RBC: 4.68 x10E6/uL (ref 4.14–5.80)
RDW: 14.3 % (ref 11.6–15.4)
WBC: 10 10*3/uL (ref 3.4–10.8)

## 2021-05-18 NOTE — Telephone Encounter (Signed)
[facility]: [phone] & [id number] (exp. 05/18/21 to 07/02/21) order sent to GI, they will reach out to the patient to schedule.

## 2021-05-19 DIAGNOSIS — G35 Multiple sclerosis: Secondary | ICD-10-CM | POA: Diagnosis not present

## 2021-05-20 DIAGNOSIS — G35 Multiple sclerosis: Secondary | ICD-10-CM | POA: Diagnosis not present

## 2021-05-20 DIAGNOSIS — R269 Unspecified abnormalities of gait and mobility: Secondary | ICD-10-CM | POA: Diagnosis not present

## 2021-05-21 DIAGNOSIS — G35 Multiple sclerosis: Secondary | ICD-10-CM | POA: Diagnosis not present

## 2021-05-22 DIAGNOSIS — G35 Multiple sclerosis: Secondary | ICD-10-CM | POA: Diagnosis not present

## 2021-05-23 DIAGNOSIS — G35 Multiple sclerosis: Secondary | ICD-10-CM | POA: Diagnosis not present

## 2021-05-23 NOTE — Telephone Encounter (Signed)
Received the JCV result. Index value is 0.18 and the JCV antibody is negative.  Will send to Dr Epimenio Foot for review

## 2021-05-24 DIAGNOSIS — G35 Multiple sclerosis: Secondary | ICD-10-CM | POA: Diagnosis not present

## 2021-05-25 DIAGNOSIS — G35 Multiple sclerosis: Secondary | ICD-10-CM | POA: Diagnosis not present

## 2021-05-26 DIAGNOSIS — G35 Multiple sclerosis: Secondary | ICD-10-CM | POA: Diagnosis not present

## 2021-05-27 DIAGNOSIS — G35 Multiple sclerosis: Secondary | ICD-10-CM | POA: Diagnosis not present

## 2021-05-28 DIAGNOSIS — G35 Multiple sclerosis: Secondary | ICD-10-CM | POA: Diagnosis not present

## 2021-05-29 DIAGNOSIS — G35 Multiple sclerosis: Secondary | ICD-10-CM | POA: Diagnosis not present

## 2021-05-30 DIAGNOSIS — G35 Multiple sclerosis: Secondary | ICD-10-CM | POA: Diagnosis not present

## 2021-05-31 DIAGNOSIS — G35 Multiple sclerosis: Secondary | ICD-10-CM | POA: Diagnosis not present

## 2021-06-01 DIAGNOSIS — G35 Multiple sclerosis: Secondary | ICD-10-CM | POA: Diagnosis not present

## 2021-06-02 DIAGNOSIS — G35 Multiple sclerosis: Secondary | ICD-10-CM | POA: Diagnosis not present

## 2021-06-03 ENCOUNTER — Other Ambulatory Visit: Payer: Self-pay

## 2021-06-03 ENCOUNTER — Non-Acute Institutional Stay (HOSPITAL_COMMUNITY)
Admission: RE | Admit: 2021-06-03 | Discharge: 2021-06-03 | Disposition: A | Discharge status: Home / Self Care | Payer: Medicaid Other | Source: Ambulatory Visit | Attending: Internal Medicine | Admitting: Internal Medicine

## 2021-06-03 DIAGNOSIS — Z79899 Other long term (current) drug therapy: Secondary | ICD-10-CM | POA: Diagnosis not present

## 2021-06-03 DIAGNOSIS — G35 Multiple sclerosis: Secondary | ICD-10-CM | POA: Diagnosis not present

## 2021-06-03 MED ORDER — SODIUM CHLORIDE 0.9 % IV SOLN
300.0000 mg | INTRAVENOUS | Status: DC
  Administered 2021-06-03: 300 mg via INTRAVENOUS
  Filled 2021-06-03: qty 15

## 2021-06-03 MED ORDER — SODIUM CHLORIDE 0.9 % IV SOLN: INTRAVENOUS | Status: DC | PRN

## 2021-06-03 NOTE — Progress Notes (Signed)
PATIENT CARE CENTER NOTE     Diagnosis: Multiple sclerosis (HCC) (G35); Relapsing remitting multiple sclerosis (HCC) (G35); High risk medication use (S01.093)      Provider: Despina Arias, MD     Procedure: Tysabri infusion      Note: Patient received Tysabri infusion (dose 3 of 12) via PIV. Tolerated infusion well with no adverse reaction. Vital signs stable. Discharge instructions given. Patient declined to stay for the 1 hour post infusion observation. Patient advised to schedule next appointment in 28 days. Alert, oriented and ambulatory with cane at discharge.

## 2021-06-04 ENCOUNTER — Ambulatory Visit
Admission: RE | Admit: 2021-06-04 | Discharge: 2021-06-04 | Disposition: A | Discharge status: Home / Self Care | Payer: Medicaid Other | Source: Ambulatory Visit | Attending: Neurology | Admitting: Neurology

## 2021-06-04 DIAGNOSIS — G35 Multiple sclerosis: Secondary | ICD-10-CM

## 2021-06-04 DIAGNOSIS — R261 Paralytic gait: Secondary | ICD-10-CM

## 2021-06-05 DIAGNOSIS — G35 Multiple sclerosis: Secondary | ICD-10-CM | POA: Diagnosis not present

## 2021-06-06 DIAGNOSIS — G35 Multiple sclerosis: Secondary | ICD-10-CM | POA: Diagnosis not present

## 2021-06-07 DIAGNOSIS — G35 Multiple sclerosis: Secondary | ICD-10-CM | POA: Diagnosis not present

## 2021-06-08 DIAGNOSIS — G35 Multiple sclerosis: Secondary | ICD-10-CM | POA: Diagnosis not present

## 2021-06-09 DIAGNOSIS — G35 Multiple sclerosis: Secondary | ICD-10-CM | POA: Diagnosis not present

## 2021-06-10 DIAGNOSIS — G35 Multiple sclerosis: Secondary | ICD-10-CM | POA: Diagnosis not present

## 2021-06-11 DIAGNOSIS — G35 Multiple sclerosis: Secondary | ICD-10-CM | POA: Diagnosis not present

## 2021-06-12 DIAGNOSIS — G35 Multiple sclerosis: Secondary | ICD-10-CM | POA: Diagnosis not present

## 2021-06-13 DIAGNOSIS — G35 Multiple sclerosis: Secondary | ICD-10-CM | POA: Diagnosis not present

## 2021-06-14 ENCOUNTER — Telehealth (INDEPENDENT_AMBULATORY_CARE_PROVIDER_SITE_OTHER): Payer: Medicaid Other | Admitting: Physician Assistant

## 2021-06-14 ENCOUNTER — Encounter (HOSPITAL_COMMUNITY): Payer: Self-pay | Admitting: Physician Assistant

## 2021-06-14 DIAGNOSIS — F411 Generalized anxiety disorder: Secondary | ICD-10-CM | POA: Diagnosis not present

## 2021-06-14 DIAGNOSIS — G4709 Other insomnia: Secondary | ICD-10-CM | POA: Diagnosis not present

## 2021-06-14 DIAGNOSIS — F319 Bipolar disorder, unspecified: Secondary | ICD-10-CM | POA: Diagnosis not present

## 2021-06-14 DIAGNOSIS — G35 Multiple sclerosis: Secondary | ICD-10-CM | POA: Diagnosis not present

## 2021-06-14 MED ORDER — QUETIAPINE FUMARATE 200 MG PO TABS: 200.0000 mg | ORAL_TABLET | Freq: Every day | ORAL | 1 refills | Status: DC

## 2021-06-14 MED ORDER — HYDROXYZINE HCL 50 MG PO TABS: 50.0000 mg | ORAL_TABLET | Freq: Three times a day (TID) | ORAL | 1 refills | Status: DC | PRN

## 2021-06-14 MED ORDER — ESCITALOPRAM OXALATE 10 MG PO TABS: 10.0000 mg | ORAL_TABLET | Freq: Every day | ORAL | 1 refills | Status: DC

## 2021-06-14 MED ORDER — GABAPENTIN 800 MG PO TABS: 800.0000 mg | ORAL_TABLET | Freq: Two times a day (BID) | ORAL | 1 refills | Status: DC
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
## 2021-06-15 DIAGNOSIS — G35 Multiple sclerosis: Secondary | ICD-10-CM | POA: Diagnosis not present

## 2021-06-16 ENCOUNTER — Other Ambulatory Visit (HOSPITAL_COMMUNITY): Payer: Self-pay | Admitting: Physician Assistant

## 2021-06-16 DIAGNOSIS — G35 Multiple sclerosis: Secondary | ICD-10-CM | POA: Diagnosis not present

## 2021-06-16 DIAGNOSIS — F411 Generalized anxiety disorder: Secondary | ICD-10-CM

## 2021-06-17 DIAGNOSIS — G35 Multiple sclerosis: Secondary | ICD-10-CM | POA: Diagnosis not present

## 2021-06-18 DIAGNOSIS — G35 Multiple sclerosis: Secondary | ICD-10-CM | POA: Diagnosis not present

## 2021-06-19 DIAGNOSIS — G35 Multiple sclerosis: Secondary | ICD-10-CM | POA: Diagnosis not present

## 2021-06-20 DIAGNOSIS — G35 Multiple sclerosis: Secondary | ICD-10-CM | POA: Diagnosis not present

## 2021-06-21 DIAGNOSIS — G35 Multiple sclerosis: Secondary | ICD-10-CM | POA: Diagnosis not present

## 2021-06-22 DIAGNOSIS — G35 Multiple sclerosis: Secondary | ICD-10-CM | POA: Diagnosis not present

## 2021-06-23 DIAGNOSIS — G35 Multiple sclerosis: Secondary | ICD-10-CM | POA: Diagnosis not present

## 2021-06-24 DIAGNOSIS — G35 Multiple sclerosis: Secondary | ICD-10-CM | POA: Diagnosis not present

## 2021-06-25 DIAGNOSIS — G35 Multiple sclerosis: Secondary | ICD-10-CM | POA: Diagnosis not present

## 2021-06-26 DIAGNOSIS — G35 Multiple sclerosis: Secondary | ICD-10-CM | POA: Diagnosis not present

## 2021-06-27 DIAGNOSIS — G35 Multiple sclerosis: Secondary | ICD-10-CM | POA: Diagnosis not present

## 2021-06-28 DIAGNOSIS — G35 Multiple sclerosis: Secondary | ICD-10-CM | POA: Diagnosis not present

## 2021-06-29 DIAGNOSIS — G35 Multiple sclerosis: Secondary | ICD-10-CM | POA: Diagnosis not present

## 2021-06-30 DIAGNOSIS — G35 Multiple sclerosis: Secondary | ICD-10-CM | POA: Diagnosis not present

## 2021-07-01 ENCOUNTER — Encounter (HOSPITAL_COMMUNITY): Payer: Medicaid Other

## 2021-07-01 DIAGNOSIS — G35 Multiple sclerosis: Secondary | ICD-10-CM | POA: Diagnosis not present

## 2021-07-02 DIAGNOSIS — G35 Multiple sclerosis: Secondary | ICD-10-CM | POA: Diagnosis not present

## 2021-07-03 DIAGNOSIS — G35 Multiple sclerosis: Secondary | ICD-10-CM | POA: Diagnosis not present

## 2021-07-04 DIAGNOSIS — G35 Multiple sclerosis: Secondary | ICD-10-CM | POA: Diagnosis not present

## 2021-07-05 DIAGNOSIS — G35 Multiple sclerosis: Secondary | ICD-10-CM | POA: Diagnosis not present

## 2021-07-06 DIAGNOSIS — G35 Multiple sclerosis: Secondary | ICD-10-CM | POA: Diagnosis not present

## 2021-07-07 ENCOUNTER — Other Ambulatory Visit (HOSPITAL_COMMUNITY): Payer: Self-pay | Admitting: Physician Assistant

## 2021-07-07 DIAGNOSIS — G35 Multiple sclerosis: Secondary | ICD-10-CM | POA: Diagnosis not present

## 2021-07-07 DIAGNOSIS — G4709 Other insomnia: Secondary | ICD-10-CM

## 2021-07-07 DIAGNOSIS — F319 Bipolar disorder, unspecified: Secondary | ICD-10-CM

## 2021-07-08 DIAGNOSIS — G35 Multiple sclerosis: Secondary | ICD-10-CM | POA: Diagnosis not present

## 2021-07-09 DIAGNOSIS — G35 Multiple sclerosis: Secondary | ICD-10-CM | POA: Diagnosis not present

## 2021-07-10 DIAGNOSIS — G35 Multiple sclerosis: Secondary | ICD-10-CM | POA: Diagnosis not present

## 2021-07-11 ENCOUNTER — Other Ambulatory Visit: Payer: Self-pay

## 2021-07-11 ENCOUNTER — Non-Acute Institutional Stay (HOSPITAL_COMMUNITY)
Admission: RE | Admit: 2021-07-11 | Discharge: 2021-07-11 | Disposition: A | Discharge status: Home / Self Care | Payer: Medicaid Other | Source: Ambulatory Visit | Attending: Internal Medicine | Admitting: Internal Medicine

## 2021-07-11 DIAGNOSIS — G35 Multiple sclerosis: Secondary | ICD-10-CM | POA: Insufficient documentation

## 2021-07-11 MED ORDER — SODIUM CHLORIDE 0.9 % IV SOLN
300.0000 mg | INTRAVENOUS | Status: DC
  Administered 2021-07-11: 300 mg via INTRAVENOUS
  Filled 2021-07-11: qty 15

## 2021-07-11 MED ORDER — SODIUM CHLORIDE 0.9 % IV SOLN: INTRAVENOUS | Status: DC | PRN

## 2021-07-11 NOTE — Progress Notes (Signed)
PATIENT CARE CENTER NOTE   Diagnosis: Multiple Sclerosis       Provider: Despina Arias, MD     Procedure: Donnamarie Poag 300mg      Note: Patient received Tysabri ( dose # 4 of 12 ) infusion via PIV. Premeds not required per orders. Tolerated infusion well with no adverse reaction. Vital signs stable. Discharge instructions given. Patient declined to stay for the 1 hour post-infusion observation. Pt scheduled to RTC on 08/08/21 for next infusion, made aware and verbalized understanding. Alert, oriented and ambulatory at discharge.

## 2021-07-12 DIAGNOSIS — G35 Multiple sclerosis: Secondary | ICD-10-CM | POA: Diagnosis not present

## 2021-07-13 DIAGNOSIS — G35 Multiple sclerosis: Secondary | ICD-10-CM | POA: Diagnosis not present

## 2021-07-14 ENCOUNTER — Ambulatory Visit (INDEPENDENT_AMBULATORY_CARE_PROVIDER_SITE_OTHER): Payer: Medicaid Other | Admitting: Nurse Practitioner

## 2021-07-14 ENCOUNTER — Encounter: Payer: Self-pay | Admitting: Nurse Practitioner

## 2021-07-14 ENCOUNTER — Other Ambulatory Visit: Payer: Self-pay

## 2021-07-14 VITALS — BP 96/59 | HR 59 | Resp 16 | Wt 196.4 lb

## 2021-07-14 DIAGNOSIS — G8191 Hemiplegia, unspecified affecting right dominant side: Secondary | ICD-10-CM

## 2021-07-14 DIAGNOSIS — F17209 Nicotine dependence, unspecified, with unspecified nicotine-induced disorders: Secondary | ICD-10-CM

## 2021-07-14 DIAGNOSIS — G959 Disease of spinal cord, unspecified: Secondary | ICD-10-CM

## 2021-07-14 DIAGNOSIS — F319 Bipolar disorder, unspecified: Secondary | ICD-10-CM

## 2021-07-14 DIAGNOSIS — G709 Myoneural disorder, unspecified: Secondary | ICD-10-CM | POA: Diagnosis not present

## 2021-07-14 DIAGNOSIS — G35 Multiple sclerosis: Secondary | ICD-10-CM | POA: Diagnosis not present

## 2021-07-14 DIAGNOSIS — F1920 Other psychoactive substance dependence, uncomplicated: Secondary | ICD-10-CM

## 2021-07-14 MED ORDER — VARENICLINE TARTRATE 1 MG PO TABS: 1.0000 mg | ORAL_TABLET | Freq: Two times a day (BID) | ORAL | 5 refills | Status: AC

## 2021-07-14 NOTE — Progress Notes (Signed)
Patient states he needs medication refills on seroguel.

## 2021-07-14 NOTE — Progress Notes (Signed)
Established Patient Office Visit  Subjective:  Patient ID: Bradley Brandt, male    DOB: 26-Jun-1980  Age: 41 y.o. MRN: [id number]  CC:  Chief Complaint  Patient presents with   Medication Refill    HPI Bradley Brandt presents for follow up. He  has a past medical history of Bipolar 1 disorder ([place]), Bipolar disorder, unspecified ([place]) (01/03/2013), MS (multiple sclerosis) ([place]) (dx 2006), and Neuromuscular disorder ([place]).   He is in today for a follow up. He continues to follow up with a counselor. He reports that it is going ok. He is wanting to be on Xanax fo this anxiety. "He is 41 years old and he feels like he is 41 yrs old." He is fearful of being on his own. His father helps with paying his bills. He reports that days he does not eat. He does get food stamps but he did not use them wisely. He does get some assistance with his household needs from his sister.  He reports that he is ready to quit smoking. He has been on Chantix in the past and did well. He would like to restart this at the highest dose. He is aware that he can not smoke while taking Chantix.  He does continue to follow up with neurology for his monthly treatment. He feels like this is effective.  Denies headache, dizziness, visual changes, shortness of breath, dyspnea on exertion, chest pain, nausea, vomiting or any edema.   Past Medical History:  Diagnosis Date   Bipolar 1 disorder ([place])    Bipolar disorder, unspecified ([place]) 01/03/2013   MS (multiple sclerosis) ([place]) dx 2006   relapsing-remitting right sided weakness   Neuromuscular disorder ([place])    MS    Past Surgical History:  Procedure Laterality Date   TUMOR REMOVAL  1983   abdomen benign    Family History  Adopted: Yes  Problem Relation Age of Onset   Lupus Mother    Ataxia Neg Hx    Chorea Neg Hx    Dementia Neg Hx    Mental retardation Neg Hx    Migraines Neg Hx    Multiple sclerosis Neg Hx    Neurofibromatosis Neg Hx    Neuropathy Neg Hx     Parkinsonism Neg Hx    Seizures Neg Hx    Stroke Neg Hx     Social History   Socioeconomic History   Marital status: Single    Spouse name: Not on file   Number of children: Not on file   Years of education: Not on file   Highest education level: Not on file  Occupational History   Not on file  Tobacco Use   Smoking status: Every Day    Packs/day: 1.00    Types: Cigarettes   Smokeless tobacco: Never  Vaping Use   Vaping Use: Former  Substance and Sexual Activity   Alcohol use: Yes    Alcohol/week: 14.0 - 21.0 standard drinks    Types: 14 - 21 Shots of liquor per week   Drug use: Yes    Frequency: 7.0 times per week    Types: Marijuana    Comment: daily   Sexual activity: Yes    Partners: Female    Birth control/protection: None  Other Topics Concern   Not on file  Social History Narrative   Not on file   Social Determinants of Health   Financial Resource Strain: Not on file  Food Insecurity: Not on file  Transportation Needs: Not on file  Physical Activity: Not on file  Stress: Not on file  Social Connections: Not on file  Intimate Partner Violence: Not on file    Outpatient Medications Prior to Visit  Medication Sig Dispense Refill   acetaminophen (TYLENOL) 500 MG tablet Take 500 mg by mouth 2 (two) times daily.     baclofen (LIORESAL) 20 MG tablet Take 1 tablet (20 mg total) by mouth 3 (three) times daily. 90 tablet 11   dalfampridine 10 MG TB12 Take 1 tablet by mouth every 12 hours. 60 tablet 11   escitalopram (LEXAPRO) 10 MG tablet Take 1 tablet (10 mg total) by mouth daily. 30 tablet 1   gabapentin (NEURONTIN) 800 MG tablet Take 1 tablet (800 mg total) by mouth 2 (two) times daily. 60 tablet 1   hydrOXYzine (ATARAX) 50 MG tablet Take 1 tablet (50 mg total) by mouth 3 (three) times daily as needed. 90 tablet 1   methylPREDNISolone (MEDROL DOSEPAK) 4 MG TBPK tablet 6 pills po x 1 day, then 5 pills po x 1 day, then 4 pills po x 1 day, then 3 pills po x 1  day, then 2 pills po x 1 day, then 1 pill po x 1 day 21 tablet 0   natalizumab (TYSABRI) 300 MG/15ML injection Inject 15 mLs (300 mg total) into the vein every 28 (twenty-eight) days. 15 mL 5   QUEtiapine (SEROQUEL) 200 MG tablet TAKE 1 TABLET BY MOUTH AT BEDTIME. 90 tablet 1   tamsulosin (FLOMAX) 0.4 MG CAPS capsule Take 2 capsules (0.8 mg total) by mouth daily. 60 capsule 11   Vitamin D, Ergocalciferol, (DRISDOL) 1.25 MG (50000 UNIT) CAPS capsule TAKE 1 CAPSULE (50,000 UNITS TOTAL) BY MOUTH EVERY 7 (SEVEN) DAYS. 12 capsule 4   No facility-administered medications prior to visit.    Allergies  Allergen Reactions   Lithium Rash    ROS Review of Systems    Objective:    Physical Exam Constitutional:      Appearance: He is normal weight.     Comments: drowsy  HENT:     Head: Normocephalic and atraumatic.     Nose: Nose normal.     Mouth/Throat:     Mouth: Mucous membranes are moist.  Cardiovascular:     Rate and Rhythm: Normal rate and regular rhythm.     Pulses: Normal pulses.     Heart sounds: Normal heart sounds.  Pulmonary:     Effort: Pulmonary effort is normal.     Breath sounds: Normal breath sounds.  Abdominal:     Palpations: Abdomen is soft.  Musculoskeletal:     Cervical back: Normal range of motion.     Right lower leg: No edema.     Left lower leg: No edema.     Comments: Altered gait related to MS  Skin:    General: Skin is warm and dry.     Capillary Refill: Capillary refill takes less than 2 seconds.  Neurological:     General: No focal deficit present.     Mental Status: He is oriented to person, place, and time.  Psychiatric:        Mood and Affect: Mood normal.        Behavior: Behavior normal.    BP (!) 96/59    Pulse (!) 59    Resp 16    Wt 196 lb 6.4 oz (89.1 kg)    SpO2 99%    BMI 27.39 kg/m  Wt Readings from Last 3 Encounters:  07/14/21 196 lb 6.4 oz (89.1 kg)  05/17/21 190 lb (86.2 kg)  04/13/21 184 lb 9.6 oz (83.7 kg)     There  are no preventive care reminders to display for this patient.   There are no preventive care reminders to display for this patient.  No results found for: TSH Lab Results  Component Value Date   WBC 10.0 05/17/2021   HGB 14.8 05/17/2021   HCT 43.4 05/17/2021   MCV 93 05/17/2021   PLT 181 05/17/2021   Lab Results  Component Value Date   NA 141 04/13/2021   K 4.8 04/13/2021   CO2 22 12/31/2017   GLUCOSE 90 04/13/2021   BUN 19 04/13/2021   CREATININE 1.18 04/13/2021   BILITOT 0.4 04/13/2021   ALKPHOS 90 04/13/2021   AST 19 04/13/2021   ALT 13 12/16/2015   PROT 7.2 04/13/2021   ALBUMIN 4.9 04/13/2021   CALCIUM 9.9 04/13/2021   ANIONGAP 14 02/09/2014   EGFR 79 04/13/2021   Lab Results  Component Value Date   CHOL 173 04/13/2021   Lab Results  Component Value Date   HDL 37 (L) 04/13/2021   Lab Results  Component Value Date   LDLCALC 114 (H) 04/13/2021   Lab Results  Component Value Date   TRIG 119 04/13/2021   Lab Results  Component Value Date   CHOLHDL 4.7 04/13/2021   No results found for: HGBA1C    Assessment & Plan:   Problem List Items Addressed This Visit       Nervous and Auditory   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HCC) (Chronic) Stable    MS (multiple sclerosis) (HCC) (Chronic) Stable  Continue with current regimen.  Follow up with neurology as scheduled     Neuromuscular disorder (HCC) (Chronic)   Relevant Medications   varenicline (CHANTIX) 1 MG tablet   Right hemiparesis (HCC) Persistent  Ambulation without can today     Other   Polysubstance dependence (HCC) Stable being followed by psychiatry    Bipolar disorder ([place]) Stable  Continue on current regimen follow up with psychiatry as scheduled   Other Visit Diagnoses     Tobacco use disorder, continuous    -  Primary Discussed the risk factors associated with smoking ; CAD, COPD, Cancer, PVD increased susceptibility to respiratory illnesses Discussed treatment options with cessation ie  counseling, support resources and available medications  Choosing a quit day and setting goals accordingly. Discussed ways to quit; start by decreasing one cigarette per day or per week.  Started Chantix 1 mg BID  Discussed potential side effects.  Encourage patient to call for assistance once ready to quit. Provided education handouts   Counseling 5-10 minutes    Relevant Medications   varenicline (CHANTIX) 1 MG tablet       Meds ordered this encounter  Medications   varenicline (CHANTIX) 1 MG tablet    Sig: Take 1 tablet (1 mg total) by mouth 2 (two) times daily.    Dispense:  60 tablet    Refill:  5    Order Specific Question:   Supervising Provider    Answer:   Tresa Garter [id number]    Follow-up: Return in about 3 months (around 10/12/2021) for follow up [id number].    Vevelyn Francois, NP

## 2021-07-14 NOTE — Progress Notes (Signed)
[facility] General Follow Up Note  07/14/2021 Name: Bradley Brandt MRN: [id number] DOB: [birth year] Bradley Brandt is a 41 y.o. year old male who sees Vevelyn Francois, NP for primary care. LCSW was initially consulted to assist the patient with Mental Health Counseling and Resources.  Interpreter: No.   Interpreter Name & Language: none  Assessment: Patient experiencing mental health concerns. He was referred by his PCP today during visit for food insecurity.  Ongoing Intervention: Met with patient during PCP visit. He has low income and it is difficult to afford food. Provided some items from Patient [facility]) food pantry. Also referred patient to [facility] community support and nutrition program.  Review of patient status, including review of consultants reports, relevant laboratory and other test results, and collaboration with appropriate care team members and the patient's provider was performed as part of comprehensive patient evaluation and provision of services.    [name], [facility] [phone]

## 2021-07-14 NOTE — Patient Instructions (Addendum)
Steps to Quit Smoking Smoking tobacco is the leading cause of preventable death. It can affect almost every organ in the body. Smoking puts you and people around you at risk for many serious, long-lasting (chronic) diseases. Quitting smoking can be hard, but it is one of the best things that you can do for your health. It is never too late to quit. How do I get ready to quit? When you decide to quit smoking, make a plan to help you succeed. Before you quit: Pick a date to quit. Set a date within the next 2 weeks to give you time to prepare. Write down the reasons why you are quitting. Keep this list in places where you will see it often. Tell your family, friends, and co-workers that you are quitting. Their support is important. Talk with your doctor about the choices that may help you quit. Find out if your health insurance will pay for these treatments. Know the people, places, things, and activities that make you want to smoke (triggers). Avoid them. What first steps can I take to quit smoking? Throw away all cigarettes at home, at work, and in your car. Throw away the things that you use when you smoke, such as ashtrays and lighters. Clean your car. Make sure to empty the ashtray. Clean your home, including curtains and carpets. What can I do to help me quit smoking? Talk with your doctor about taking medicines and seeing a counselor at the same time. You are more likely to succeed when you do both. If you are pregnant or breastfeeding, talk with your doctor about counseling or other ways to quit smoking. Do not take medicine to help you quit smoking unless your doctor tells you to do so. To quit smoking: Quit right away Quit smoking totally, instead of slowly cutting back on how much you smoke over a period of time. Go to counseling. You are more likely to quit if you go to counseling sessions regularly. Take medicine You may take medicines to help you quit. Some medicines need a  prescription, and some you can buy over-the-counter. Some medicines may contain a drug called nicotine to replace the nicotine in cigarettes. Medicines may: Help you to stop having the desire to smoke (cravings). Help to stop the problems that come when you stop smoking (withdrawal symptoms). Your doctor may ask you to use: Nicotine patches, gum, or lozenges. Nicotine inhalers or sprays. Non-nicotine medicine that is taken by mouth. Find resources Find resources and other ways to help you quit smoking and remain smoke-free after you quit. These resources are most helpful when you use them often. They include: Online chats with a Veterinary surgeon. Phone quitlines. Printed Materials engineer. Support groups or group counseling. Text messaging programs. Mobile phone apps. Use apps on your mobile phone or tablet that can help you stick to your quit plan. There are many free apps for mobile phones and tablets as well as websites. Examples include Quit Guide from the [employer] and smokefree.gov  What things can I do to make it easier to quit?  Talk to your family and friends. Ask them to support and encourage you. Call a phone quitline (1-800-QUIT-NOW), reach out to support groups, or work with a Veterinary surgeon. Ask people who smoke to not smoke around you. Avoid places that make you want to smoke, such as: Bars. Parties. Smoke-break areas at work. Spend time with people who do not smoke. Lower the stress in your life. Stress can make you want to  smoke. Try these things to help your stress: Getting regular exercise. Doing deep-breathing exercises. Doing yoga. Meditating. Doing a body scan. To do this, close your eyes, focus on one area of your body at a time from head to toe. Notice which parts of your body are tense. Try to relax the muscles in those areas. How will I feel when I quit smoking? Day 1 to 3 weeks Within the first 24 hours, you may start to have some problems that come from quitting tobacco.  These problems are very bad 2-3 days after you quit, but they do not often last for more than 2-3 weeks. You may get these symptoms: Mood swings. Feeling restless, nervous, angry, or annoyed. Trouble concentrating. Dizziness. Strong desire for high-sugar foods and nicotine. Weight gain. Trouble pooping (constipation). Feeling like you may vomit (nausea). Coughing or a sore throat. Changes in how the medicines that you take for other issues work in your body. Depression. Trouble sleeping (insomnia). Week 3 and afterward After the first 2-3 weeks of quitting, you may start to notice more positive results, such as: Better sense of smell and taste. Less coughing and sore throat. Slower heart rate. Lower blood pressure. Clearer skin. Better breathing. Fewer sick days. Quitting smoking can be hard. Do not give up if you fail the first time. Some people need to try a few times before they succeed. Do your best to stick to your quit plan, and talk with your doctor if you have any questions or concerns. Summary Smoking tobacco is the leading cause of preventable death. Quitting smoking can be hard, but it is one of the best things that you can do for your health. When you decide to quit smoking, make a plan to help you succeed. Quit smoking right away, not slowly over a period of time. When you start quitting, seek help from your doctor, family, or friends. This information is not intended to replace advice given to you by your health care provider. Make sure you discuss any questions you have with your health care provider. Document Revised: 02/11/2021 Document Reviewed: 08/24/2018 Elsevier Patient Education  2022 [employer]. Preventive Care 86-84 Years Old, Male Preventive care refers to lifestyle choices and visits with your health care provider that can promote health and wellness. Preventive care visits are also called wellness exams. What can I expect for my preventive care  visit? Counseling During your preventive care visit, your health care provider may ask about your: Medical history, including: Past medical problems. Family medical history. Current health, including: Emotional well-being. Home life and relationship well-being. Sexual activity. Lifestyle, including: Alcohol, nicotine or tobacco, and drug use. Access to firearms. Diet, exercise, and sleep habits. Safety issues such as seatbelt and bike helmet use. Sunscreen use. Work and work Astronomer. Physical exam Your health care provider will check your: Height and weight. These may be used to calculate your BMI (body mass index). BMI is a measurement that tells if you are at a healthy weight. Waist circumference. This measures the distance around your waistline. This measurement also tells if you are at a healthy weight and may help predict your risk of certain diseases, such as type 2 diabetes and high blood pressure. Heart rate and blood pressure. Body temperature. Skin for abnormal spots. What immunizations do I need? Vaccines are usually given at various ages, according to a schedule. Your health care provider will recommend vaccines for you based on your age, medical history, and lifestyle or other factors, such as  travel or where you work. What tests do I need? Screening Your health care provider may recommend screening tests for certain conditions. This may include: Lipid and cholesterol levels. Diabetes screening. This is done by checking your blood sugar (glucose) after you have not eaten for a while (fasting). Hepatitis B test. Hepatitis C test. HIV (human immunodeficiency virus) test. STI (sexually transmitted infection) testing, if you are at risk. Lung cancer screening. Prostate cancer screening. Colorectal cancer screening. Talk with your health care provider about your test results, treatment options, and if necessary, the need for more tests. Follow these instructions at  home: Eating and drinking  Eat a diet that includes fresh fruits and vegetables, whole grains, lean protein, and low-fat dairy products. Take vitamin and mineral supplements as recommended by your health care provider. Do not drink alcohol if your health care provider tells you not to drink. If you drink alcohol: Limit how much you have to 0-2 drinks a day. Know how much alcohol is in your drink. In the [place] one drink equals one 12 oz bottle of beer (355 mL), one 5 oz glass of wine (148 mL), or one 1 oz glass of hard liquor (44 mL). Lifestyle Brush your teeth every morning and night with fluoride toothpaste. Floss one time each day. Exercise for at least 30 minutes 5 or more days each week. Do not use any products that contain nicotine or tobacco. These products include cigarettes, chewing tobacco, and vaping devices, such as e-cigarettes. If you need help quitting, ask your health care provider. Do not use drugs. If you are sexually active, practice safe sex. Use a condom or other form of protection to prevent STIs. Take aspirin only as told by your health care provider. Make sure that you understand how much to take and what form to take. Work with your health care provider to find out whether it is safe and beneficial for you to take aspirin daily. Find healthy ways to manage stress, such as: Meditation, yoga, or listening to music. Journaling. Talking to a trusted person. Spending time with friends and family. Minimize exposure to UV radiation to reduce your risk of skin cancer. Safety Always wear your seat belt while driving or riding in a vehicle. Do not drive: If you have been drinking alcohol. Do not ride with someone who has been drinking. When you are tired or distracted. While texting. If you have been using any mind-altering substances or drugs. Wear a helmet and other protective equipment during sports activities. If you have firearms in your house, make sure you follow  all gun safety procedures. What's next? Go to your health care provider once a year for an annual wellness visit. Ask your health care provider how often you should have your eyes and teeth checked. Stay up to date on all vaccines. This information is not intended to replace advice given to you by your health care provider. Make sure you discuss any questions you have with your health care provider. Document Revised: 12/01/2020 Document Reviewed: 12/01/2020 Elsevier Patient Education  2022 [employer].

## 2021-07-15 ENCOUNTER — Ambulatory Visit (HOSPITAL_COMMUNITY): Payer: Medicaid Other | Admitting: Licensed Clinical Social Worker

## 2021-07-15 DIAGNOSIS — G35 Multiple sclerosis: Secondary | ICD-10-CM | POA: Diagnosis not present

## 2021-07-16 DIAGNOSIS — G35 Multiple sclerosis: Secondary | ICD-10-CM | POA: Diagnosis not present

## 2021-07-17 DIAGNOSIS — G35 Multiple sclerosis: Secondary | ICD-10-CM | POA: Diagnosis not present

## 2021-07-18 DIAGNOSIS — G35 Multiple sclerosis: Secondary | ICD-10-CM | POA: Diagnosis not present

## 2021-07-19 DIAGNOSIS — G35 Multiple sclerosis: Secondary | ICD-10-CM | POA: Diagnosis not present

## 2021-07-20 DIAGNOSIS — G35 Multiple sclerosis: Secondary | ICD-10-CM | POA: Diagnosis not present

## 2021-07-21 DIAGNOSIS — G35 Multiple sclerosis: Secondary | ICD-10-CM | POA: Diagnosis not present

## 2021-07-22 DIAGNOSIS — G35 Multiple sclerosis: Secondary | ICD-10-CM | POA: Diagnosis not present

## 2021-07-23 DIAGNOSIS — G35 Multiple sclerosis: Secondary | ICD-10-CM | POA: Diagnosis not present

## 2021-07-24 DIAGNOSIS — G35 Multiple sclerosis: Secondary | ICD-10-CM | POA: Diagnosis not present

## 2021-07-25 ENCOUNTER — Telehealth: Payer: Medicaid Other | Admitting: Nurse Practitioner

## 2021-07-25 ENCOUNTER — Other Ambulatory Visit: Payer: Self-pay

## 2021-07-25 DIAGNOSIS — G35 Multiple sclerosis: Secondary | ICD-10-CM | POA: Diagnosis not present

## 2021-07-26 DIAGNOSIS — G35 Multiple sclerosis: Secondary | ICD-10-CM | POA: Diagnosis not present

## 2021-07-27 DIAGNOSIS — G35 Multiple sclerosis: Secondary | ICD-10-CM | POA: Diagnosis not present

## 2021-07-28 DIAGNOSIS — G35 Multiple sclerosis: Secondary | ICD-10-CM | POA: Diagnosis not present

## 2021-07-29 DIAGNOSIS — G35 Multiple sclerosis: Secondary | ICD-10-CM | POA: Diagnosis not present

## 2021-07-30 DIAGNOSIS — G35 Multiple sclerosis: Secondary | ICD-10-CM | POA: Diagnosis not present

## 2021-07-31 DIAGNOSIS — G35 Multiple sclerosis: Secondary | ICD-10-CM | POA: Diagnosis not present

## 2021-08-01 DIAGNOSIS — G35 Multiple sclerosis: Secondary | ICD-10-CM | POA: Diagnosis not present

## 2021-08-02 DIAGNOSIS — G35 Multiple sclerosis: Secondary | ICD-10-CM | POA: Diagnosis not present

## 2021-08-03 DIAGNOSIS — G35 Multiple sclerosis: Secondary | ICD-10-CM | POA: Diagnosis not present

## 2021-08-04 ENCOUNTER — Telehealth (INDEPENDENT_AMBULATORY_CARE_PROVIDER_SITE_OTHER): Payer: Medicaid Other | Admitting: Physician Assistant

## 2021-08-04 ENCOUNTER — Encounter (HOSPITAL_COMMUNITY): Payer: Self-pay | Admitting: Physician Assistant

## 2021-08-04 DIAGNOSIS — G4709 Other insomnia: Secondary | ICD-10-CM

## 2021-08-04 DIAGNOSIS — F411 Generalized anxiety disorder: Secondary | ICD-10-CM

## 2021-08-04 DIAGNOSIS — F319 Bipolar disorder, unspecified: Secondary | ICD-10-CM | POA: Diagnosis not present

## 2021-08-04 DIAGNOSIS — G35 Multiple sclerosis: Secondary | ICD-10-CM | POA: Diagnosis not present

## 2021-08-04 MED ORDER — ESCITALOPRAM OXALATE 10 MG PO TABS: 10.0000 mg | ORAL_TABLET | Freq: Every day | ORAL | 2 refills | Status: DC

## 2021-08-04 MED ORDER — HYDROXYZINE HCL 50 MG PO TABS: 50.0000 mg | ORAL_TABLET | Freq: Three times a day (TID) | ORAL | 2 refills | Status: DC | PRN

## 2021-08-04 MED ORDER — GABAPENTIN 800 MG PO TABS: 800.0000 mg | ORAL_TABLET | Freq: Two times a day (BID) | ORAL | 1 refills | Status: DC

## 2021-08-04 MED ORDER — QUETIAPINE FUMARATE 200 MG PO TABS: 200.0000 mg | ORAL_TABLET | Freq: Every day | ORAL | 2 refills | Status: DC

## 2021-08-04 NOTE — Progress Notes (Addendum)
BH MD/PA/NP OP Progress Note  Virtual Visit via Telephone Note  I connected with Bradley Brandt on 08/04/21 at  1:30 PM EST by telephone and verified that I am speaking with the correct person using two identifiers.  Location: Patient: Home Provider: Clinic   I discussed the limitations, risks, security and privacy concerns of performing an evaluation and management service by telephone and the availability of in person appointments. I also discussed with the patient that there may be a patient responsible charge related to this service. The patient expressed understanding and agreed to proceed.  Follow Up Instructions:   I discussed the assessment and treatment plan with the patient. The patient was provided an opportunity to ask questions and all were answered. The patient agreed with the plan and demonstrated an understanding of the instructions.   The patient was advised to call back or seek an in-person evaluation if the symptoms worsen or if the condition fails to improve as anticipated.  I provided 17 minutes of non-face-to-face time during this encounter.  Meta Hatchet, PA    08/04/2021 8:33 PM Bradley Brandt  MRN:  [id number]  Chief Complaint: No chief complaint on file.  HPI:   Bradley Brandt is a 41 year old male with a past psychiatric history significant for bipolar disorder, insomnia, and generalized anxiety disorder who presents to [facility] via virtual telephone visit for follow-up and medication management.  Patient is currently being managed on the following medications:  Seroquel 200 mg at bedtime Lexapro 10 mg daily Hydroxyzine 50 mg 3 times daily as needed Gabapentin 800 mg 2 times daily  Patient reports no issues or concerns regarding his current medication regimen.  Patient states that he has ran out of his current medications and is requesting refills.  Patient reports that his mood has been up and down and  is currently dealing with depression.  Patient endorses the following depressive symptoms: low mood, decreased energy, and lack of motivation.  Patient also endorses elevated anxiety and rates his anxiety at 10 out of 10.  Patient's main stressor involves being unable to do anything due to running out of his medications.  A PHQ-9 screen was performed with the patient scoring a 24.  A GAD-7 screen was also performed with the patient scoring a 21.  Patient is alert and oriented x4, calm, cooperative, and fully engaged in conversation during the encounter.  Patient endorses fine mood.  Patient denies suicidal ideations.  He endorses homicidal ideations but denies a specific plan or intent.  The patient denied auditory or visual hallucinations and does not appear to be responding to internal/external stimuli.  Patient endorses poor sleep and states that his sleep has been impaired.  Patient endorses decreased appetite and states that he has not had much food in the past 3 weeks.  Patient states that he eats on average 1-3 meals per day.  Patient endorses tobacco use and smokes on average 3 cigarettes/day.  Patient endorses illicit drug use in the form of marijuana.  Visit Diagnosis:    ICD-10-CM   1. Bipolar affective disorder, remission status unspecified (HCC)  F31.9 QUEtiapine (SEROQUEL) 200 MG tablet    escitalopram (LEXAPRO) 10 MG tablet    2. Other insomnia  G47.09 QUEtiapine (SEROQUEL) 200 MG tablet    3. Generalized anxiety disorder  F41.1 escitalopram (LEXAPRO) 10 MG tablet    hydrOXYzine (ATARAX) 50 MG tablet    gabapentin (NEURONTIN) 800 MG tablet  Past Psychiatric History:  Bipolar disorder Generalized anxiety disorder  Past Medical History:  Past Medical History:  Diagnosis Date   Bipolar 1 disorder (HCC)    Bipolar disorder, unspecified (HCC) 01/03/2013   MS (multiple sclerosis) (HCC) dx 2006   relapsing-remitting right sided weakness   Neuromuscular disorder (HCC)    MS     Past Surgical History:  Procedure Laterality Date   TUMOR REMOVAL  1983   abdomen benign    Family Psychiatric History:  No known family history of psychiatric illness  Family History:  Family History  Adopted: Yes  Problem Relation Age of Onset   Lupus Mother    Ataxia Neg Hx    Chorea Neg Hx    Dementia Neg Hx    Mental retardation Neg Hx    Migraines Neg Hx    Multiple sclerosis Neg Hx    Neurofibromatosis Neg Hx    Neuropathy Neg Hx    Parkinsonism Neg Hx    Seizures Neg Hx    Stroke Neg Hx     Social History:  Social History   Socioeconomic History   Marital status: Single    Spouse name: Not on file   Number of children: Not on file   Years of education: Not on file   Highest education level: Not on file  Occupational History   Not on file  Tobacco Use   Smoking status: Every Day    Packs/day: 1.00    Types: Cigarettes   Smokeless tobacco: Never  Vaping Use   Vaping Use: Former  Substance and Sexual Activity   Alcohol use: Yes    Alcohol/week: 14.0 - 21.0 standard drinks    Types: 14 - 21 Shots of liquor per week   Drug use: Yes    Frequency: 7.0 times per week    Types: Marijuana    Comment: daily   Sexual activity: Yes    Partners: Female    Birth control/protection: None  Other Topics Concern   Not on file  Social History Narrative   Not on file   Social Determinants of Health   Financial Resource Strain: Not on file  Food Insecurity: Not on file  Transportation Needs: Not on file  Physical Activity: Not on file  Stress: Not on file  Social Connections: Not on file    Allergies:  Allergies  Allergen Reactions   Lithium Rash    Metabolic Disorder Labs: No results found for: HGBA1C, MPG No results found for: PROLACTIN Lab Results  Component Value Date   CHOL 173 04/13/2021   TRIG 119 04/13/2021   HDL 37 (L) 04/13/2021   CHOLHDL 4.7 04/13/2021   LDLCALC 114 (H) 04/13/2021   LDLCALC 136 (H) 04/08/2020   No results found  for: TSH  Therapeutic Level Labs: No results found for: LITHIUM No results found for: VALPROATE No components found for:  CBMZ  Current Medications: Current Outpatient Medications  Medication Sig Dispense Refill   acetaminophen (TYLENOL) 500 MG tablet Take 500 mg by mouth 2 (two) times daily.     baclofen (LIORESAL) 20 MG tablet Take 1 tablet (20 mg total) by mouth 3 (three) times daily. 90 tablet 11   dalfampridine 10 MG TB12 Take 1 tablet by mouth every 12 hours. 60 tablet 11   escitalopram (LEXAPRO) 10 MG tablet Take 1 tablet (10 mg total) by mouth daily. 30 tablet 2   gabapentin (NEURONTIN) 800 MG tablet Take 1 tablet (800 mg total) by mouth 2 (  two) times daily. 60 tablet 1   hydrOXYzine (ATARAX) 50 MG tablet Take 1 tablet (50 mg total) by mouth 3 (three) times daily as needed. 90 tablet 2   methylPREDNISolone (MEDROL DOSEPAK) 4 MG TBPK tablet 6 pills po x 1 day, then 5 pills po x 1 day, then 4 pills po x 1 day, then 3 pills po x 1 day, then 2 pills po x 1 day, then 1 pill po x 1 day 21 tablet 0   natalizumab (TYSABRI) 300 MG/15ML injection Inject 15 mLs (300 mg total) into the vein every 28 (twenty-eight) days. 15 mL 5   QUEtiapine (SEROQUEL) 200 MG tablet Take 1 tablet (200 mg total) by mouth at bedtime. 90 tablet 2   tamsulosin (FLOMAX) 0.4 MG CAPS capsule Take 2 capsules (0.8 mg total) by mouth daily. 60 capsule 11   varenicline (CHANTIX) 1 MG tablet Take 1 tablet (1 mg total) by mouth 2 (two) times daily. 60 tablet 5   Vitamin D, Ergocalciferol, (DRISDOL) 1.25 MG (50000 UNIT) CAPS capsule TAKE 1 CAPSULE (50,000 UNITS TOTAL) BY MOUTH EVERY 7 (SEVEN) DAYS. 12 capsule 4   No current facility-administered medications for this visit.     Musculoskeletal: Strength & Muscle Tone: Unable to assess due to telemedicine visit Gait & Station: Unable to assess due to telemedicine visit Patient leans: Unable to assess due to telemedicine visit  Psychiatric Specialty Exam: Review of  Systems  Psychiatric/Behavioral:  Positive for decreased concentration, dysphoric mood and sleep disturbance. Negative for hallucinations, self-injury and suicidal ideas. The patient is nervous/anxious. The patient is not hyperactive.    There were no vitals taken for this visit.There is no height or weight on file to calculate BMI.  General Appearance: Unable to assess due to telemedicine visit  Eye Contact:  Unable to assess due to telemedicine visit  Speech:  Clear and Coherent and Normal Rate  Volume:  Normal  Mood:  Anxious and Depressed  Affect:  Congruent and Depressed  Thought Process:  Coherent and Descriptions of Associations: Intact  Orientation:  Full (Time, Place, and Person)  Thought Content: WDL   Suicidal Thoughts:  No  Homicidal Thoughts:  No  Memory:  Immediate;   Good Recent;   Good Remote;   Fair  Judgement:  Fair  Insight:  Fair  Psychomotor Activity:  Restlessness  Concentration:  Concentration: Good and Attention Span: Good  Recall:  [employer] of Knowledge: Fair  Language: Good  Akathisia:  No  Handed:  Right  AIMS (if indicated): not done  Assets:  Communication Skills Desire for Improvement Housing Social Support  ADL's:  Intact  Cognition: WNL  Sleep:  Fair   Screenings: GAD-7    Flowsheet Row Video Visit from 08/04/2021 in [facility] Video Visit from 06/14/2021 in [facility] Clinical Support from 09/21/2020 in [facility] Office Visit from 08/11/2020 in [facility] Office Visit from 06/09/2020 in [facility]  Total GAD-7 Score PHQ2-9    Flowsheet Row Video Visit from 08/04/2021 in [facility] Most recent reading at 08/04/2021  1:55 PM Video Visit from 06/14/2021 in [facility] Most recent reading at 06/14/2021  1:15 PM ED from  04/13/2021 in [facility] Most recent reading at 04/13/2021 12:40 PM Office Visit from 04/13/2021 in [facility] Most recent reading at 04/13/2021  8:17 AM Office Visit from 08/11/2020 in [facility] Most recent reading at 08/11/2020 10:37 AM  PHQ-2 Total Score 6 6 3 6 3   PHQ-9 Total Score 24 17 12 27 17       Flowsheet Row Video Visit from 08/04/2021 in [facility] Video Visit from 06/14/2021 in [facility] Office Visit from 08/11/2020 in [facility]  C-SSRS RISK CATEGORY Low Risk Low Risk Low Risk        Assessment and Plan:   Bradley Brandt is a 41 year old male with a past psychiatric history significant for bipolar disorder, insomnia, and generalized anxiety disorder who presents to [facility] via virtual telephone visit for follow-up and medication management.  Patient reports that he recently ran out of his current medication regimen.  Due to running out of his medications, patient endorses depressive symptoms and anxiety.  Patient denies the need for dosage adjustments at this time but is requesting refills on all of his medications.  Patient's medications to be e-prescribed to pharmacy of choice.  Collaboration of Care: Collaboration of Care: Medication Management AEB patient's medications being managed by this provider, Primary Care Provider AEB patient being managed by a primary care provider, and Psychiatrist AEB patient being managed by this provider.  Patient/Guardian was advised Release of Information must be obtained prior to any record release in order to collaborate their care with an outside provider. Patient/Guardian was advised if they have not already done so to contact the registration department to sign all necessary forms in order for 53 to release information regarding  their care.   Consent: Patient/Guardian gives verbal consent for treatment and assignment of benefits for services provided during this visit. Patient/Guardian expressed understanding and agreed to proceed.   1. Bipolar affective disorder, remission status unspecified (HCC)  - QUEtiapine (SEROQUEL) 200 MG tablet; Take 1 tablet (200 mg total) by mouth at bedtime.  Dispense: 90 tablet; Refill: 2 - escitalopram (LEXAPRO) 10 MG tablet; Take 1 tablet (10 mg total) by mouth daily.  Dispense: 30 tablet; Refill: 2  2. Other insomnia  3. Generalized anxiety disorder  - escitalopram (LEXAPRO) 10 MG tablet; Take 1 tablet (10 mg total) by mouth daily.  Dispense: 30 tablet; Refill: 2 - hydrOXYzine (ATARAX) 50 MG tablet; Take 1 tablet (50 mg total) by mouth 3 (three) times daily as needed.  Dispense: 90 tablet; Refill: 2 - gabapentin (NEURONTIN) 800 MG tablet; Take 1 tablet (800 mg total) by mouth 2 (two) times daily.  Dispense: 60 tablet; Refill: 1  Patient to follow up in 6 weeks Provider spent a total of 17 minutes with the patient/reviewing patient's chart  [facility], PA 08/04/2021, 8:33 PM

## 2021-08-05 ENCOUNTER — Telehealth (HOSPITAL_COMMUNITY): Payer: Self-pay | Admitting: Licensed Clinical Social Worker

## 2021-08-05 ENCOUNTER — Ambulatory Visit (HOSPITAL_COMMUNITY): Payer: Medicaid Other | Admitting: Licensed Clinical Social Worker

## 2021-08-05 ENCOUNTER — Encounter (HOSPITAL_COMMUNITY): Payer: Self-pay

## 2021-08-05 DIAGNOSIS — G35 Multiple sclerosis: Secondary | ICD-10-CM | POA: Diagnosis not present

## 2021-08-05 NOTE — Telephone Encounter (Signed)
LCSW sent text message with link for video session per schedule. When pt failed to sign on LCSW sent link two more times at intervals and then called pt's phone when he still failed to sign on. Call went directly to vm. LCSW left detailed message re purpose of call with office number for him to reschedule prn.

## 2021-08-06 DIAGNOSIS — G35 Multiple sclerosis: Secondary | ICD-10-CM | POA: Diagnosis not present

## 2021-08-07 DIAGNOSIS — G35 Multiple sclerosis: Secondary | ICD-10-CM | POA: Diagnosis not present

## 2021-08-08 ENCOUNTER — Other Ambulatory Visit: Payer: Self-pay

## 2021-08-08 ENCOUNTER — Non-Acute Institutional Stay (HOSPITAL_COMMUNITY)
Admission: RE | Admit: 2021-08-08 | Discharge: 2021-08-08 | Disposition: A | Discharge status: Home / Self Care | Payer: Medicaid Other | Source: Ambulatory Visit | Attending: Internal Medicine | Admitting: Internal Medicine

## 2021-08-08 DIAGNOSIS — G35 Multiple sclerosis: Secondary | ICD-10-CM | POA: Diagnosis not present

## 2021-08-08 MED ORDER — SODIUM CHLORIDE 0.9 % IV SOLN: INTRAVENOUS | Status: DC | PRN

## 2021-08-08 MED ORDER — SODIUM CHLORIDE 0.9 % IV SOLN
300.0000 mg | INTRAVENOUS | Status: DC
  Administered 2021-08-08: 300 mg via INTRAVENOUS
  Filled 2021-08-08: qty 15

## 2021-08-08 NOTE — Progress Notes (Signed)
PATIENT CARE CENTER NOTE     Diagnosis: Multiple sclerosis (HCC) (G35); Relapsing remitting multiple sclerosis (HCC) (G35); High risk medication use (I71.245)      Provider: Despina Arias, MD     Procedure: Donnamarie Poag IV     Note: Patient received Tysabri infusion (dose 5 of 12) via PIV. Tolerated infusion well with no adverse reaction. Vital signs stable. Discharge instructions given. Patient declined to stay for the 1 hour post infusion observation. Patient advised to schedule next appointment in 28 days. Alert, oriented and ambulatory at discharge.

## 2021-08-09 DIAGNOSIS — G35 Multiple sclerosis: Secondary | ICD-10-CM | POA: Diagnosis not present

## 2021-08-10 DIAGNOSIS — G35 Multiple sclerosis: Secondary | ICD-10-CM | POA: Diagnosis not present

## 2021-08-11 DIAGNOSIS — G35 Multiple sclerosis: Secondary | ICD-10-CM | POA: Diagnosis not present

## 2021-08-12 DIAGNOSIS — G35 Multiple sclerosis: Secondary | ICD-10-CM | POA: Diagnosis not present

## 2021-08-13 DIAGNOSIS — G35 Multiple sclerosis: Secondary | ICD-10-CM | POA: Diagnosis not present

## 2021-08-14 DIAGNOSIS — G35 Multiple sclerosis: Secondary | ICD-10-CM | POA: Diagnosis not present

## 2021-08-15 DIAGNOSIS — G35 Multiple sclerosis: Secondary | ICD-10-CM | POA: Diagnosis not present

## 2021-08-16 DIAGNOSIS — G35 Multiple sclerosis: Secondary | ICD-10-CM | POA: Diagnosis not present

## 2021-08-17 DIAGNOSIS — G35 Multiple sclerosis: Secondary | ICD-10-CM | POA: Diagnosis not present

## 2021-08-18 DIAGNOSIS — G35 Multiple sclerosis: Secondary | ICD-10-CM | POA: Diagnosis not present

## 2021-08-19 DIAGNOSIS — G35 Multiple sclerosis: Secondary | ICD-10-CM | POA: Diagnosis not present

## 2021-08-20 DIAGNOSIS — G35 Multiple sclerosis: Secondary | ICD-10-CM | POA: Diagnosis not present

## 2021-08-21 DIAGNOSIS — G35 Multiple sclerosis: Secondary | ICD-10-CM | POA: Diagnosis not present

## 2021-08-22 DIAGNOSIS — G35 Multiple sclerosis: Secondary | ICD-10-CM | POA: Diagnosis not present

## 2021-08-23 DIAGNOSIS — R269 Unspecified abnormalities of gait and mobility: Secondary | ICD-10-CM | POA: Diagnosis not present

## 2021-08-23 DIAGNOSIS — G35 Multiple sclerosis: Secondary | ICD-10-CM | POA: Diagnosis not present

## 2021-08-24 DIAGNOSIS — G35 Multiple sclerosis: Secondary | ICD-10-CM | POA: Diagnosis not present

## 2021-08-25 DIAGNOSIS — G35 Multiple sclerosis: Secondary | ICD-10-CM | POA: Diagnosis not present

## 2021-08-26 ENCOUNTER — Other Ambulatory Visit (HOSPITAL_COMMUNITY): Payer: Self-pay | Admitting: Physician Assistant

## 2021-08-26 DIAGNOSIS — F319 Bipolar disorder, unspecified: Secondary | ICD-10-CM

## 2021-08-26 DIAGNOSIS — G35 Multiple sclerosis: Secondary | ICD-10-CM | POA: Diagnosis not present

## 2021-08-26 DIAGNOSIS — F411 Generalized anxiety disorder: Secondary | ICD-10-CM

## 2021-08-27 DIAGNOSIS — G35 Multiple sclerosis: Secondary | ICD-10-CM | POA: Diagnosis not present

## 2021-08-28 DIAGNOSIS — G35 Multiple sclerosis: Secondary | ICD-10-CM | POA: Diagnosis not present

## 2021-08-29 DIAGNOSIS — G35 Multiple sclerosis: Secondary | ICD-10-CM | POA: Diagnosis not present

## 2021-08-30 DIAGNOSIS — G35 Multiple sclerosis: Secondary | ICD-10-CM | POA: Diagnosis not present

## 2021-08-31 DIAGNOSIS — G35 Multiple sclerosis: Secondary | ICD-10-CM | POA: Diagnosis not present

## 2021-09-01 DIAGNOSIS — G35 Multiple sclerosis: Secondary | ICD-10-CM | POA: Diagnosis not present

## 2021-09-02 ENCOUNTER — Telehealth (HOSPITAL_COMMUNITY): Payer: Self-pay | Admitting: Physician Assistant

## 2021-09-02 DIAGNOSIS — G35 Multiple sclerosis: Secondary | ICD-10-CM | POA: Diagnosis not present

## 2021-09-02 NOTE — Telephone Encounter (Signed)
Provider was able to contact patient regarding his concerns.  Patient reports that his girlfriend has been pushing away from him due to his marijuana use.  Per patient, his girlfriend reports that he appears slower when taking marijuana.  Patient reports that marijuana helps with his pain management and allows him more mobility.  Patient states at 1 point he was taking hydrocodone 50 mg for the management of his pain.  Provider informed patient to reach out to primary care provider to discuss other options for the management of his pain. ? ?Patient denies suicidal ideations.  He does endorse homicidal ideation towards his girlfriend due to her being distant to him.  Provider informed patient to not harm his significant other due to her stance on his marijuana use.  Patient vocalized understanding and was agreeable to recommendation.  Patient denies being a danger to himself.

## 2021-09-02 NOTE — Telephone Encounter (Signed)
Pt says he really needs to speak to provider today. Please call pt [phone].

## 2021-09-03 DIAGNOSIS — G35 Multiple sclerosis: Secondary | ICD-10-CM | POA: Diagnosis not present

## 2021-09-04 DIAGNOSIS — G35 Multiple sclerosis: Secondary | ICD-10-CM | POA: Diagnosis not present

## 2021-09-05 ENCOUNTER — Other Ambulatory Visit: Payer: Self-pay

## 2021-09-05 ENCOUNTER — Non-Acute Institutional Stay (HOSPITAL_COMMUNITY)
Admission: RE | Admit: 2021-09-05 | Discharge: 2021-09-05 | Disposition: A | Discharge status: Home / Self Care | Payer: Medicaid Other | Source: Ambulatory Visit | Attending: Internal Medicine | Admitting: Internal Medicine

## 2021-09-05 DIAGNOSIS — G35 Multiple sclerosis: Secondary | ICD-10-CM | POA: Diagnosis not present

## 2021-09-05 DIAGNOSIS — Z79899 Other long term (current) drug therapy: Secondary | ICD-10-CM | POA: Diagnosis not present

## 2021-09-05 MED ORDER — SODIUM CHLORIDE 0.9 % IV SOLN
300.0000 mg | INTRAVENOUS | Status: DC
  Administered 2021-09-05: 300 mg via INTRAVENOUS
  Filled 2021-09-05: qty 15

## 2021-09-05 MED ORDER — SODIUM CHLORIDE 0.9 % IV SOLN: INTRAVENOUS | Status: DC | PRN

## 2021-09-05 NOTE — Progress Notes (Signed)
PATIENT CARE CENTER NOTE ?  ?  ?Diagnosis: Multiple sclerosis (HCC) (G35); Relapsing remitting multiple sclerosis (HCC) (G35); High risk medication use (Z79.899) ?  ?   ?Provider: Despina Arias, MD ?  ?  ?Procedure: Tysabri IV ?  ?  ?Note: Patient received Tysabri infusion (dose 6 of 12) via PIV. Tolerated infusion well with no adverse reaction. Vital signs stable. Discharge instructions given. Patient declined to stay for the 1 hour post infusion observation. Patient advised to schedule next appointment in 28 days. Alert, oriented and ambulatory at discharge. ?

## 2021-09-06 DIAGNOSIS — G35 Multiple sclerosis: Secondary | ICD-10-CM | POA: Diagnosis not present

## 2021-09-07 DIAGNOSIS — G35 Multiple sclerosis: Secondary | ICD-10-CM | POA: Diagnosis not present

## 2021-09-08 DIAGNOSIS — G35 Multiple sclerosis: Secondary | ICD-10-CM | POA: Diagnosis not present

## 2021-09-09 DIAGNOSIS — G35 Multiple sclerosis: Secondary | ICD-10-CM | POA: Diagnosis not present

## 2021-09-10 DIAGNOSIS — G35 Multiple sclerosis: Secondary | ICD-10-CM | POA: Diagnosis not present

## 2021-09-11 DIAGNOSIS — G35 Multiple sclerosis: Secondary | ICD-10-CM | POA: Diagnosis not present

## 2021-09-12 DIAGNOSIS — G35 Multiple sclerosis: Secondary | ICD-10-CM | POA: Diagnosis not present

## 2021-09-13 DIAGNOSIS — G35 Multiple sclerosis: Secondary | ICD-10-CM | POA: Diagnosis not present

## 2021-09-14 DIAGNOSIS — G35 Multiple sclerosis: Secondary | ICD-10-CM | POA: Diagnosis not present

## 2021-09-15 DIAGNOSIS — G35 Multiple sclerosis: Secondary | ICD-10-CM | POA: Diagnosis not present

## 2021-09-16 ENCOUNTER — Other Ambulatory Visit (HOSPITAL_COMMUNITY): Payer: Self-pay | Admitting: *Deleted

## 2021-09-16 DIAGNOSIS — G35 Multiple sclerosis: Secondary | ICD-10-CM | POA: Diagnosis not present

## 2021-09-17 DIAGNOSIS — G35 Multiple sclerosis: Secondary | ICD-10-CM | POA: Diagnosis not present

## 2021-09-18 DIAGNOSIS — G35 Multiple sclerosis: Secondary | ICD-10-CM | POA: Diagnosis not present

## 2021-09-19 DIAGNOSIS — G35 Multiple sclerosis: Secondary | ICD-10-CM | POA: Diagnosis not present

## 2021-09-20 DIAGNOSIS — G35 Multiple sclerosis: Secondary | ICD-10-CM | POA: Diagnosis not present

## 2021-09-21 DIAGNOSIS — G35 Multiple sclerosis: Secondary | ICD-10-CM | POA: Diagnosis not present

## 2021-09-22 DIAGNOSIS — G35 Multiple sclerosis: Secondary | ICD-10-CM | POA: Diagnosis not present

## 2021-09-23 DIAGNOSIS — G35 Multiple sclerosis: Secondary | ICD-10-CM | POA: Diagnosis not present

## 2021-09-24 DIAGNOSIS — G35 Multiple sclerosis: Secondary | ICD-10-CM | POA: Diagnosis not present

## 2021-09-25 DIAGNOSIS — G35 Multiple sclerosis: Secondary | ICD-10-CM | POA: Diagnosis not present

## 2021-09-26 DIAGNOSIS — G35 Multiple sclerosis: Secondary | ICD-10-CM | POA: Diagnosis not present

## 2021-09-27 DIAGNOSIS — G35 Multiple sclerosis: Secondary | ICD-10-CM | POA: Diagnosis not present

## 2021-09-28 DIAGNOSIS — G35 Multiple sclerosis: Secondary | ICD-10-CM | POA: Diagnosis not present

## 2021-09-29 ENCOUNTER — Telehealth (HOSPITAL_COMMUNITY): Payer: Medicaid Other | Admitting: Physician Assistant

## 2021-09-29 ENCOUNTER — Encounter (HOSPITAL_COMMUNITY): Payer: Self-pay

## 2021-09-29 ENCOUNTER — Telehealth: Payer: Self-pay | Admitting: *Deleted

## 2021-09-29 DIAGNOSIS — G35 Multiple sclerosis: Secondary | ICD-10-CM | POA: Diagnosis not present

## 2021-09-29 NOTE — Telephone Encounter (Signed)
Faxed signed orders back to [employer] re: [place] certificate of medical necessity/SWO for "repair to power mobility device". Fax: [phone]. Received fax confirmation. ?

## 2021-09-30 DIAGNOSIS — G35 Multiple sclerosis: Secondary | ICD-10-CM | POA: Diagnosis not present

## 2021-10-01 DIAGNOSIS — G35 Multiple sclerosis: Secondary | ICD-10-CM | POA: Diagnosis not present

## 2021-10-02 DIAGNOSIS — G35 Multiple sclerosis: Secondary | ICD-10-CM | POA: Diagnosis not present

## 2021-10-03 ENCOUNTER — Non-Acute Institutional Stay (HOSPITAL_COMMUNITY)
Admission: RE | Admit: 2021-10-03 | Discharge: 2021-10-03 | Disposition: A | Discharge status: Home / Self Care | Payer: Medicaid Other | Source: Ambulatory Visit | Attending: Internal Medicine | Admitting: Internal Medicine

## 2021-10-03 DIAGNOSIS — G35 Multiple sclerosis: Secondary | ICD-10-CM | POA: Diagnosis not present

## 2021-10-03 DIAGNOSIS — Z79899 Other long term (current) drug therapy: Secondary | ICD-10-CM | POA: Insufficient documentation

## 2021-10-03 MED ORDER — SODIUM CHLORIDE 0.9 % IV SOLN: INTRAVENOUS | Status: DC | PRN

## 2021-10-03 MED ORDER — SODIUM CHLORIDE 0.9 % IV SOLN
300.0000 mg | INTRAVENOUS | Status: DC
  Administered 2021-10-03: 300 mg via INTRAVENOUS
  Filled 2021-10-03: qty 15

## 2021-10-03 NOTE — Progress Notes (Signed)
PATIENT CARE CENTER NOTE ?  ?  ?Diagnosis: Multiple sclerosis (HCC) (G35); Relapsing remitting multiple sclerosis (HCC) (G35); High risk medication use (Z79.899) ?  ?   ?Provider: Despina Arias, MD ?  ?  ?Procedure: Tysabri IV ?  ?  ?Note: Patient received Tysabri infusion (dose 7 of 12) via PIV. Tolerated infusion well with no adverse reaction. Vital signs stable. Discharge instructions given. Patient declined to stay for the 1 hour post infusion observation. Patient advised to schedule next appointment at front desk. Alert, oriented and ambulatory at discharge. ?

## 2021-10-04 DIAGNOSIS — G35 Multiple sclerosis: Secondary | ICD-10-CM | POA: Diagnosis not present

## 2021-10-05 DIAGNOSIS — G35 Multiple sclerosis: Secondary | ICD-10-CM | POA: Diagnosis not present

## 2021-10-06 DIAGNOSIS — G35 Multiple sclerosis: Secondary | ICD-10-CM | POA: Diagnosis not present

## 2021-10-07 DIAGNOSIS — G35 Multiple sclerosis: Secondary | ICD-10-CM | POA: Diagnosis not present

## 2021-10-08 DIAGNOSIS — G35 Multiple sclerosis: Secondary | ICD-10-CM | POA: Diagnosis not present

## 2021-10-09 DIAGNOSIS — G35 Multiple sclerosis: Secondary | ICD-10-CM | POA: Diagnosis not present

## 2021-10-10 DIAGNOSIS — G35 Multiple sclerosis: Secondary | ICD-10-CM | POA: Diagnosis not present

## 2021-10-11 DIAGNOSIS — G35 Multiple sclerosis: Secondary | ICD-10-CM | POA: Diagnosis not present

## 2021-10-12 ENCOUNTER — Ambulatory Visit: Payer: Self-pay | Admitting: Nurse Practitioner

## 2021-10-12 ENCOUNTER — Ambulatory Visit (INDEPENDENT_AMBULATORY_CARE_PROVIDER_SITE_OTHER): Payer: Medicaid Other | Admitting: Nurse Practitioner

## 2021-10-12 ENCOUNTER — Encounter: Payer: Self-pay | Admitting: Nurse Practitioner

## 2021-10-12 VITALS — BP 116/80 | HR 55 | Temp 98.4°F | Ht 70.0 in | Wt 183.6 lb

## 2021-10-12 DIAGNOSIS — M5441 Lumbago with sciatica, right side: Secondary | ICD-10-CM | POA: Diagnosis not present

## 2021-10-12 DIAGNOSIS — G8929 Other chronic pain: Secondary | ICD-10-CM | POA: Diagnosis not present

## 2021-10-12 DIAGNOSIS — G35 Multiple sclerosis: Secondary | ICD-10-CM | POA: Diagnosis not present

## 2021-10-12 MED ORDER — LIDOCAINE 4 % EX PTCH: 1.0000 | MEDICATED_PATCH | CUTANEOUS | 0 refills | Status: DC

## 2021-10-12 NOTE — Progress Notes (Signed)
? ?[facility] ?[place], [place]  [address] ?Phone:  [phone]   Fax:  [phone] ?Subjective:  ? Patient ID: Bradley Brandt, male    DOB: 1981/03/18, 41 y.o.   MRN: [id number] ? ?Chief Complaint  ?Patient presents with  ? Follow-up  ?  Patient is here today for his 3 month follow up and discuss severe back pains. Patient states that the pains is so severe that he can't stand up or walk. Patient states that he has been dealing with his back pains for years. Patient states that he use to be on oxycodone 57m but when he went was incarcerated they took him off.  ? ?HPI ?MShirl Harris89y.o. male  has a past medical history of Bipolar 1 disorder ([place], Bipolar disorder, unspecified ([place] (01/03/2013), MS (multiple sclerosis) ([place] (dx 2006), and Neuromuscular disorder ([place]. [facility] ? ?Patient states that he has acute on chronic back pain. Has had back pain for several years. Was initially receiving Oxycodone for pain, but was taken off of medication when he was incarcerated. Pain located in the diffuse lower back, states, " it feels like something is pushing on it constantly." States that pain radiates down BLE and causes him to fall at times. Currently rates pain 10/10, denies any worsening or improving factors. Denies any worsening or improving factors. Endorses taking ibuprofen with no improvement in pain. Denies any other concerns today. ? ?Denies any fatigue, chest pain, shortness of breath, HA or dizziness. Denies any blurred vision, acute numbness or tingling. ? ?Past Medical History:  ?Diagnosis Date  ? Bipolar 1 disorder ([place]   ? B[place] disorder, unspecified ([place] 01/03/2013  ? MS (multiple sclerosis) (HB[place] dx 2006  ? relapsing-remitting right sided weakness  ? Neuromuscular disorder (HF[place]   ? MS  ? ? ?Past Surgical History:  ?Procedure Laterality Date  ? TUMOR REMOVAL  1983  ? abdomen benign  ? ? ?Family History  ?Adopted: Yes  ?Problem  Relation Age of Onset  ? Lupus Mother   ? Ataxia Neg Hx   ? Chorea Neg Hx   ? Dementia Neg Hx   ? Mental retardation Neg Hx   ? Migraines Neg Hx   ? Multiple sclerosis Neg Hx   ? Neurofibromatosis Neg Hx   ? Neuropathy Neg Hx   ? Parkinsonism Neg Hx   ? Seizures Neg Hx   ? Stroke Neg Hx   ? ? ?Social History  ? ?Socioeconomic History  ? Marital status: Single  ?  Spouse name: Not on file  ? Number of children: Not on file  ? Years of education: Not on file  ? Highest education level: Not on file  ?Occupational History  ? Not on file  ?Tobacco Use  ? Smoking status: Every Day  ?  Packs/day: 1.00  ?  Types: Cigarettes  ? Smokeless tobacco: Never  ?Vaping Use  ? Vaping Use: Former  ?Substance and Sexual Activity  ? Alcohol use: Not Currently  ?  Alcohol/week: 14.0 - 21.0 standard drinks  ?  Types: 14 - 21 Shots of liquor per week  ? Drug use: Not Currently  ?  Frequency: 7.0 times per week  ?  Types: Marijuana  ?  Comment: daily  ? Sexual activity: Yes  ?  Partners: Female  ?  Birth control/protection: None  ?Other Topics Concern  ? Not on file  ?Social History Narrative  ? Not  on file  ? ?Social Determinants of Health  ? ?Financial Resource Strain: Not on file  ?Food Insecurity: Not on file  ?Transportation Needs: Not on file  ?Physical Activity: Not on file  ?Stress: Not on file  ?Social Connections: Not on file  ?Intimate Partner Violence: Not on file  ? ? ?Outpatient Medications Prior to Visit  ?Medication Sig Dispense Refill  ? acetaminophen (TYLENOL) 500 MG tablet Take 500 mg by mouth 2 (two) times daily.    ? baclofen (LIORESAL) 20 MG tablet Take 1 tablet (20 mg total) by mouth 3 (three) times daily. 90 tablet 11  ? dalfampridine 10 MG TB12 Take 1 tablet by mouth every 12 hours. 60 tablet 11  ? DULoxetine (CYMBALTA) 30 MG capsule Take 30 mg by mouth every morning.    ? escitalopram (LEXAPRO) 10 MG tablet TAKE 1 TABLET BY MOUTH EVERY DAY 90 tablet 1  ? gabapentin (NEURONTIN) 800 MG tablet Take 1 tablet (800 mg  total) by mouth 2 (two) times daily. 60 tablet 1  ? hydrOXYzine (ATARAX) 50 MG tablet Take 1 tablet (50 mg total) by mouth 3 (three) times daily as needed. 90 tablet 2  ? natalizumab (TYSABRI) 300 MG/15ML injection Inject 15 mLs (300 mg total) into the vein every 28 (twenty-eight) days. 15 mL 5  ? QUEtiapine (SEROQUEL) 200 MG tablet Take 1 tablet (200 mg total) by mouth at bedtime. 90 tablet 2  ? tamsulosin (FLOMAX) 0.4 MG CAPS capsule Take 2 capsules (0.8 mg total) by mouth daily. 60 capsule 11  ? varenicline (CHANTIX) 1 MG tablet Take 1 tablet (1 mg total) by mouth 2 (two) times daily. 60 tablet 5  ? Vitamin D, Ergocalciferol, (DRISDOL) 1.25 MG (50000 UNIT) CAPS capsule TAKE 1 CAPSULE (50,000 UNITS TOTAL) BY MOUTH EVERY 7 (SEVEN) DAYS. 12 capsule 4  ? methylPREDNISolone (MEDROL DOSEPAK) 4 MG TBPK tablet 6 pills po x 1 day, then 5 pills po x 1 day, then 4 pills po x 1 day, then 3 pills po x 1 day, then 2 pills po x 1 day, then 1 pill po x 1 day (Patient not taking: Reported on 10/12/2021) 21 tablet 0  ? ?No facility-administered medications prior to visit.  ? ? ?Allergies  ?Allergen Reactions  ? Lithium Rash  ? ? ?Review of Systems  ?Constitutional:  Negative for chills, fever and malaise/fatigue.  ?Respiratory:  Negative for cough and shortness of breath.   ?Cardiovascular:  Negative for chest pain, palpitations and leg swelling.  ?Gastrointestinal:  Negative for abdominal pain, blood in stool, constipation, diarrhea, nausea and vomiting.  ?Musculoskeletal:  Positive for back pain. Negative for falls, joint pain, myalgias and neck pain.  ?Skin: Negative.   ?Neurological: Negative.   ?Psychiatric/Behavioral:  Negative for depression. The patient is not nervous/anxious.   ?All other systems reviewed and are negative. ? ?   ?Objective:  ?  ?Physical Exam ?Constitutional:   ?   General: He is not in acute distress. ?   Appearance: Normal appearance.  ?HENT:  ?   Head: Normocephalic.  ?Cardiovascular:  ?   Rate and  Rhythm: Normal rate and regular rhythm.  ?   Pulses: Normal pulses.  ?   Heart sounds: Normal heart sounds.  ?   Comments: No obvious peripheral edema ?Pulmonary:  ?   Effort: Pulmonary effort is normal.  ?   Breath sounds: Normal breath sounds.  ?Musculoskeletal:     ?   General: No swelling, tenderness, deformity or signs  of injury. Normal range of motion.  ?   Right lower leg: No edema.  ?   Left lower leg: No edema.  ?Skin: ?   General: Skin is warm and dry.  ?   Capillary Refill: Capillary refill takes less than 2 seconds.  ?Neurological:  ?   General: No focal deficit present.  ?   Mental Status: He is alert and oriented to person, place, and time.  ?Psychiatric:     ?   Mood and Affect: Mood normal.     ?   Behavior: Behavior normal.     ?   Thought Content: Thought content normal.     ?   Judgment: Judgment normal.  ? ? ?BP 116/80   Pulse (!) 55   Temp 98.4 ?F (36.9 ?C)   Ht 5' 10" (1.778 m)   Wt 183 lb 9.6 oz (83.3 kg)   SpO2 100%   BMI 26.34 kg/m?  ?Wt Readings from Last 3 Encounters:  ?10/12/21 183 lb 9.6 oz (83.3 kg)  ?07/14/21 196 lb 6.4 oz (89.1 kg)  ?05/17/21 190 lb (86.2 kg)  ? ? ?Immunization History  ?Administered Date(s) Administered  ? Moderna SARS-COV2 Booster Vaccination 05/20/2020  ? Moderna Sars-Covid-2 Vaccination 09/04/2019, 10/07/2019  ? ? ?Diabetic Foot Exam - Simple   ?No data filed ?  ? ? ?No results found for: TSH ?Lab Results  ?Component Value Date  ? WBC 10.0 05/17/2021  ? HGB 14.8 05/17/2021  ? HCT 43.4 05/17/2021  ? MCV 93 05/17/2021  ? PLT 181 05/17/2021  ? ?Lab Results  ?Component Value Date  ? NA 141 04/13/2021  ? K 4.8 04/13/2021  ? CO2 22 12/31/2017  ? GLUCOSE 90 04/13/2021  ? BUN 19 04/13/2021  ? CREATININE 1.18 04/13/2021  ? BILITOT 0.4 04/13/2021  ? ALKPHOS 90 04/13/2021  ? AST 19 04/13/2021  ? ALT 13 12/16/2015  ? PROT 7.2 04/13/2021  ? ALBUMIN 4.9 04/13/2021  ? CALCIUM 9.9 04/13/2021  ? ANIONGAP 14 02/09/2014  ? EGFR 79 04/13/2021  ? ?Lab Results  ?Component Value  Date  ? CHOL 173 04/13/2021  ? CHOL 194 04/08/2020  ? ?Lab Results  ?Component Value Date  ? HDL 37 (L) 04/13/2021  ? HDL 38 (L) 04/08/2020  ? ?Lab Results  ?Component Value Date  ? LDLCALC 114 (H) 04/13/2021  ? [place]

## 2021-10-12 NOTE — Patient Instructions (Signed)
You were seen today in the [facility] for low back pain.  You were prescribed medications, please take as directed. Please follow up in 3 mths for reevaluation of low back pain ?

## 2021-10-13 DIAGNOSIS — G35 Multiple sclerosis: Secondary | ICD-10-CM | POA: Diagnosis not present

## 2021-10-14 ENCOUNTER — Other Ambulatory Visit: Payer: Self-pay | Admitting: Neurology

## 2021-10-14 DIAGNOSIS — G35 Multiple sclerosis: Secondary | ICD-10-CM | POA: Diagnosis not present

## 2021-10-14 DIAGNOSIS — G4709 Other insomnia: Secondary | ICD-10-CM

## 2021-10-14 DIAGNOSIS — F319 Bipolar disorder, unspecified: Secondary | ICD-10-CM

## 2021-10-15 DIAGNOSIS — G35 Multiple sclerosis: Secondary | ICD-10-CM | POA: Diagnosis not present

## 2021-10-16 DIAGNOSIS — G35 Multiple sclerosis: Secondary | ICD-10-CM | POA: Diagnosis not present

## 2021-10-17 DIAGNOSIS — G35 Multiple sclerosis: Secondary | ICD-10-CM | POA: Diagnosis not present

## 2021-10-18 DIAGNOSIS — G35 Multiple sclerosis: Secondary | ICD-10-CM | POA: Diagnosis not present

## 2021-10-19 DIAGNOSIS — G35 Multiple sclerosis: Secondary | ICD-10-CM | POA: Diagnosis not present

## 2021-10-20 DIAGNOSIS — G35 Multiple sclerosis: Secondary | ICD-10-CM | POA: Diagnosis not present

## 2021-10-21 DIAGNOSIS — G35 Multiple sclerosis: Secondary | ICD-10-CM | POA: Diagnosis not present

## 2021-10-22 DIAGNOSIS — G35 Multiple sclerosis: Secondary | ICD-10-CM | POA: Diagnosis not present

## 2021-10-23 DIAGNOSIS — G35 Multiple sclerosis: Secondary | ICD-10-CM | POA: Diagnosis not present

## 2021-10-24 DIAGNOSIS — G35 Multiple sclerosis: Secondary | ICD-10-CM | POA: Diagnosis not present

## 2021-10-25 ENCOUNTER — Encounter (HOSPITAL_COMMUNITY): Payer: Self-pay

## 2021-10-25 ENCOUNTER — Telehealth (HOSPITAL_COMMUNITY): Payer: Medicaid Other | Admitting: Physician Assistant

## 2021-10-25 DIAGNOSIS — G35 Multiple sclerosis: Secondary | ICD-10-CM | POA: Diagnosis not present

## 2021-10-26 DIAGNOSIS — G35 Multiple sclerosis: Secondary | ICD-10-CM | POA: Diagnosis not present

## 2021-10-27 DIAGNOSIS — G35 Multiple sclerosis: Secondary | ICD-10-CM | POA: Diagnosis not present

## 2021-10-28 DIAGNOSIS — G35 Multiple sclerosis: Secondary | ICD-10-CM | POA: Diagnosis not present

## 2021-10-29 DIAGNOSIS — G35 Multiple sclerosis: Secondary | ICD-10-CM | POA: Diagnosis not present

## 2021-10-30 DIAGNOSIS — G35 Multiple sclerosis: Secondary | ICD-10-CM | POA: Diagnosis not present

## 2021-10-31 ENCOUNTER — Encounter (HOSPITAL_COMMUNITY): Payer: Medicaid Other

## 2021-10-31 DIAGNOSIS — G35 Multiple sclerosis: Secondary | ICD-10-CM | POA: Diagnosis not present

## 2021-11-01 DIAGNOSIS — G35 Multiple sclerosis: Secondary | ICD-10-CM | POA: Diagnosis not present

## 2021-11-02 DIAGNOSIS — G35 Multiple sclerosis: Secondary | ICD-10-CM | POA: Diagnosis not present

## 2021-11-03 DIAGNOSIS — G35 Multiple sclerosis: Secondary | ICD-10-CM | POA: Diagnosis not present

## 2021-11-04 ENCOUNTER — Non-Acute Institutional Stay (HOSPITAL_COMMUNITY)
Admission: RE | Admit: 2021-11-04 | Discharge: 2021-11-04 | Disposition: A | Discharge status: Home / Self Care | Payer: Medicaid Other | Source: Ambulatory Visit | Attending: Internal Medicine | Admitting: Internal Medicine

## 2021-11-04 DIAGNOSIS — Z79899 Other long term (current) drug therapy: Secondary | ICD-10-CM | POA: Insufficient documentation

## 2021-11-04 DIAGNOSIS — G35 Multiple sclerosis: Secondary | ICD-10-CM | POA: Insufficient documentation

## 2021-11-04 MED ORDER — SODIUM CHLORIDE 0.9 % IV SOLN
300.0000 mg | INTRAVENOUS | Status: DC
  Administered 2021-11-04: 300 mg via INTRAVENOUS
  Filled 2021-11-04: qty 15

## 2021-11-04 MED ORDER — SODIUM CHLORIDE 0.9 % IV SOLN: INTRAVENOUS | Status: DC | PRN

## 2021-11-04 NOTE — Progress Notes (Signed)
PATIENT CARE CENTER NOTE     Diagnosis: Multiple sclerosis (HCC) (G35); Relapsing remitting multiple sclerosis (HCC) (G35); High risk medication use (O71.219)      Provider: Despina Arias, MD     Procedure: Donnamarie Poag IV     Note: Patient received Tysabri infusion (dose 8 of 12) via PIV. Tolerated infusion well with no adverse reaction. Vital signs stable. Discharge instructions given. Patient declined to stay for the 1 hour post infusion observation. Patient advised to schedule next appointment at front desk. Alert, oriented and ambulatory at discharge.

## 2021-11-05 DIAGNOSIS — G35 Multiple sclerosis: Secondary | ICD-10-CM | POA: Diagnosis not present

## 2021-11-06 DIAGNOSIS — G35 Multiple sclerosis: Secondary | ICD-10-CM | POA: Diagnosis not present

## 2021-11-07 DIAGNOSIS — G35 Multiple sclerosis: Secondary | ICD-10-CM | POA: Diagnosis not present

## 2021-11-08 DIAGNOSIS — G35 Multiple sclerosis: Secondary | ICD-10-CM | POA: Diagnosis not present

## 2021-11-09 DIAGNOSIS — G35 Multiple sclerosis: Secondary | ICD-10-CM | POA: Diagnosis not present

## 2021-11-10 DIAGNOSIS — G35 Multiple sclerosis: Secondary | ICD-10-CM | POA: Diagnosis not present

## 2021-11-11 DIAGNOSIS — G35 Multiple sclerosis: Secondary | ICD-10-CM | POA: Diagnosis not present

## 2021-11-13 DIAGNOSIS — G35 Multiple sclerosis: Secondary | ICD-10-CM | POA: Diagnosis not present

## 2021-11-14 DIAGNOSIS — G35 Multiple sclerosis: Secondary | ICD-10-CM | POA: Diagnosis not present

## 2021-11-15 DIAGNOSIS — G35 Multiple sclerosis: Secondary | ICD-10-CM | POA: Diagnosis not present

## 2021-11-16 DIAGNOSIS — G35 Multiple sclerosis: Secondary | ICD-10-CM | POA: Diagnosis not present

## 2021-11-17 ENCOUNTER — Ambulatory Visit: Payer: Medicaid Other | Admitting: Adult Health

## 2021-11-17 ENCOUNTER — Encounter: Payer: Self-pay | Admitting: Adult Health

## 2021-11-17 DIAGNOSIS — G35 Multiple sclerosis: Secondary | ICD-10-CM | POA: Diagnosis not present

## 2021-11-17 NOTE — Progress Notes (Deleted)
PATIENT: NAOMI FITTON DOB: 09-28-1980  REASON FOR VISIT: follow up HISTORY FROM: patient PRIMARY NEUROLOGIST: Dr. Epimenio Foot  HISTORY OF PRESENT ILLNESS: Today 11/17/21:  Mr. Whittley is a 41 year old male with relapse remitting multiple sclerosis. He returns today for FU. Remains on Tysabri- last JCV was negative 0.18.   Gait/Balance: Continues to use Dalfampridine. Uses a cane with ambulating  Sensory/strength: Denies any new numbness/ tingling or weakness. Remains on baclofen for spasticity Vision: no changes in vision Bowels/bladder: Reports that he has urgency   HISTORY Update 05/16/2021:   He is on Tysabri and gets infusions at [facility].   He tolerates it well.   Last JCV Ab was negative at 0.16 on 05/26/2020.     He feels he is stable for the most part.    He has trouble words out and has a mildly slurred speech.   He has had that x several years byt feels it is worse.    He actually feels his gait has done better.   He is using a cane but can walk 100 feet or more without it.   He has a left  foot drop and spastic gait.    This is better than last year.    He denies arm weakness or spasticity   No numbness or tingling   Bladder function is the same with some urgency as well as hesitancy (helped by tamsulosin)     He is on baclofen for spasticity and dalfampridine for the walking.      Vision is doing the same.   He feel she needs new glasses       He feels fatigued but this is stable.   He notes some depression, similar to earlier in the year.   He has more anxiety.   He is seeing psychiatry again.   He was diagnosed with bipolar disease   He is sleeping worse.[name]   He notes cognition is about the same.  Focus is sometimes poor.            MS history: Kei was diagnosed around 2001 when he presented with right-sided numbness and clumsiness and weakness.   Initially, he was on Avonex and then Rebif but had relapses on both of them.  He could not tolerate Tecfidera.   He was  started on Tysabri about 1 year ago and his last infusion was at [facility] at 11/08/2015.  He was seen Dr. Gaynelle Adu [facility] and those records were reviewed. He did receive a course of Solu-Medrol March 2015 and another course of Solu-Medrol January 2016 when he presented with left leg weakness and numbness that was new    An MRI of the brain 08/28/2013 personally reviewed shows old extensive white matter changes in both hemispheres with multiple sclerosis. There were new areas of restricted diffusion without definite enhancement that could be consistent with ongoing demyelination when compared to the MRI from 05/13/2013.   Imaging: MRI of the brain 08/30/2019 showed multiple T2/FLAIR hyperintense foci in the hemispheres, pons, cerebellum and thalamus in a pattern and configuration consistent with chronic demyelinating plaque associated with multiple sclerosis.  None of the foci appear to be acute and they do not enhance.  Compared to the MRI dated 01/14/2019, there is no interval progression.   MRI of the cervical spine 01/06/2018 showed T2 hyperintense foci within the spinal cord posteriorly adjacent to C2, to the right adjacent to C3, to the left adjacent  to C4 and centrally and to the right adjacent to C4-C5 and C5.   None of these appear to be acute and they were all present on the 2015 MRI.[name]     MRI of the brain 01/06/2018 showed multiple T2/FLAIR hyperintense foci in the hemispheres and in the left middle cerebellar peduncle consistent with chronic demyelinating plaque associated with multiple sclerosis.  None of the foci appears to be acute.  None of them enhance.  However, when compared to the 2015 MRI focus in the cerebellum and a couple foci in the hemispheres were not clearly present on the earlier MRI.     REVIEW OF SYSTEMS: Out of a complete 14 system review of symptoms, the patient complains only of the following symptoms, and all other reviewed systems are  negative.  ALLERGIES: Allergies  Allergen Reactions   Lithium Rash    HOME MEDICATIONS: Outpatient Medications Prior to Visit  Medication Sig Dispense Refill   acetaminophen (TYLENOL) 500 MG tablet Take 500 mg by mouth 2 (two) times daily.     baclofen (LIORESAL) 20 MG tablet Take 1 tablet (20 mg total) by mouth 3 (three) times daily. 90 tablet 11   dalfampridine 10 MG TB12 Take 1 tablet by mouth every 12 hours. 60 tablet 11   DULoxetine (CYMBALTA) 30 MG capsule Take 30 mg by mouth every morning.     escitalopram (LEXAPRO) 10 MG tablet TAKE 1 TABLET BY MOUTH EVERY DAY 90 tablet 1   gabapentin (NEURONTIN) 800 MG tablet Take 1 tablet (800 mg total) by mouth 2 (two) times daily. 60 tablet 1   hydrOXYzine (ATARAX) 50 MG tablet Take 1 tablet (50 mg total) by mouth 3 (three) times daily as needed. 90 tablet 2   lidocaine (HM LIDOCAINE PATCH) 4 % Place 1 patch onto the skin daily. 15 patch 0   methylPREDNISolone (MEDROL DOSEPAK) 4 MG TBPK tablet 6 pills po x 1 day, then 5 pills po x 1 day, then 4 pills po x 1 day, then 3 pills po x 1 day, then 2 pills po x 1 day, then 1 pill po x 1 day (Patient not taking: Reported on 10/12/2021) 21 tablet 0   natalizumab (TYSABRI) 300 MG/15ML injection Inject 15 mLs (300 mg total) into the vein every 28 (twenty-eight) days. 15 mL 5   QUEtiapine (SEROQUEL) 200 MG tablet Take 1 tablet (200 mg total) by mouth at bedtime. 90 tablet 2   tamsulosin (FLOMAX) 0.4 MG CAPS capsule Take 2 capsules (0.8 mg total) by mouth daily. 60 capsule 11   varenicline (CHANTIX) 1 MG tablet Take 1 tablet (1 mg total) by mouth 2 (two) times daily. 60 tablet 5   Vitamin D, Ergocalciferol, (DRISDOL) 1.25 MG (50000 UNIT) CAPS capsule TAKE 1 CAPSULE (50,000 UNITS TOTAL) BY MOUTH EVERY 7 (SEVEN) DAYS. 12 capsule 4   No facility-administered medications prior to visit.    PAST MEDICAL HISTORY: Past Medical History:  Diagnosis Date   Bipolar 1 disorder (HCC)    Bipolar disorder,  unspecified (HCC) 01/03/2013   MS (multiple sclerosis) (HCC) dx 2006   relapsing-remitting right sided weakness   Neuromuscular disorder (HCC)    MS    PAST SURGICAL HISTORY: Past Surgical History:  Procedure Laterality Date   TUMOR REMOVAL  1983   abdomen benign    FAMILY HISTORY: Family History  Adopted: Yes  Problem Relation Age of Onset   Lupus Mother    Ataxia Neg Hx    Chorea Neg Hx  Dementia Neg Hx    Mental retardation Neg Hx    Migraines Neg Hx    Multiple sclerosis Neg Hx    Neurofibromatosis Neg Hx    Neuropathy Neg Hx    Parkinsonism Neg Hx    Seizures Neg Hx    Stroke Neg Hx     SOCIAL HISTORY: Social History   Socioeconomic History   Marital status: Single    Spouse name: Not on file   Number of children: Not on file   Years of education: Not on file   Highest education level: Not on file  Occupational History   Not on file  Tobacco Use   Smoking status: Every Day    Packs/day: 1.00    Types: Cigarettes   Smokeless tobacco: Never  Vaping Use   Vaping Use: Former  Substance and Sexual Activity   Alcohol use: Not Currently    Alcohol/week: 14.0 - 21.0 standard drinks    Types: 14 - 21 Shots of liquor per week   Drug use: Not Currently    Frequency: 7.0 times per week    Types: Marijuana    Comment: daily   Sexual activity: Yes    Partners: Female    Birth control/protection: None  Other Topics Concern   Not on file  Social History Narrative   Not on file   Social Determinants of Health   Financial Resource Strain: Not on file  Food Insecurity: Not on file  Transportation Needs: Not on file  Physical Activity: Not on file  Stress: Not on file  Social Connections: Not on file  Intimate Partner Violence: Not on file      PHYSICAL EXAM  There were no vitals filed for this visit. There is no height or weight on file to calculate BMI.  Generalized: Well developed, in no acute distress   Neurological examination  Mentation:  Alert oriented to time, place, history taking. Follows all commands speech and language fluent Cranial nerve II-XII: Pupils were equal round reactive to light. Extraocular movements were full, visual field were full on confrontational test. Facial sensation and strength were normal. Uvula tongue midline. Head turning and shoulder shrug  were normal and symmetric. Motor: The motor testing reveals 5 over 5 strength of all 4 extremities. Good symmetric motor tone is noted throughout.  Sensory: Sensory testing is intact to soft touch on all 4 extremities. No evidence of extinction is noted.  Coordination: Cerebellar testing reveals good finger-nose-finger and heel-to-shin bilaterally.  Gait and station: Gait is normal. Tandem gait is normal. Romberg is negative. No drift is seen.  Reflexes: Deep tendon reflexes are symmetric and normal bilaterally.   DIAGNOSTIC DATA (LABS, IMAGING, TESTING) - I reviewed patient records, labs, notes, testing and imaging myself where available.  Lab Results  Component Value Date   WBC 10.0 05/17/2021   HGB 14.8 05/17/2021   HCT 43.4 05/17/2021   MCV 93 05/17/2021   PLT 181 05/17/2021      Component Value Date/Time   NA 141 04/13/2021 0942   K 4.8 04/13/2021 0942   CL 101 04/13/2021 0942   CO2 22 12/31/2017 0909   GLUCOSE 90 04/13/2021 0942   GLUCOSE 124 (H) 02/09/2014 1334   BUN 19 04/13/2021 0942   CREATININE 1.18 04/13/2021 0942   CALCIUM 9.9 04/13/2021 0942   PROT 7.2 04/13/2021 0942   ALBUMIN 4.9 04/13/2021 0942   AST 19 04/13/2021 0942   ALT 13 12/16/2015 1341   ALKPHOS 90 04/13/2021 0942  BILITOT 0.4 04/13/2021 0942   GFRNONAA 66 04/08/2020 1044   GFRAA 76 04/08/2020 1044   Lab Results  Component Value Date   CHOL 173 04/13/2021   HDL 37 (L) 04/13/2021   LDLCALC 114 (H) 04/13/2021   TRIG 119 04/13/2021   CHOLHDL 4.7 04/13/2021   No results found for: HGBA1C No results found for: VITAMINB12 No results found for: TSH    ASSESSMENT  AND PLAN 41 y.o. year old male  has a past medical history of Bipolar 1 disorder (HCC), Bipolar disorder, unspecified (HCC) (01/03/2013), MS (multiple sclerosis) (HCC) (dx 2006), and Neuromuscular disorder (HCC). here with ***     Butch Penny, MSN, NP-C 11/17/2021, 7:26 AM [facility] [address] [place], [place] [address] [phone]

## 2021-11-18 DIAGNOSIS — G35 Multiple sclerosis: Secondary | ICD-10-CM | POA: Diagnosis not present

## 2021-11-19 DIAGNOSIS — G35 Multiple sclerosis: Secondary | ICD-10-CM | POA: Diagnosis not present

## 2021-11-20 DIAGNOSIS — G35 Multiple sclerosis: Secondary | ICD-10-CM | POA: Diagnosis not present

## 2021-11-21 DIAGNOSIS — G35 Multiple sclerosis: Secondary | ICD-10-CM | POA: Diagnosis not present

## 2021-11-22 DIAGNOSIS — G35 Multiple sclerosis: Secondary | ICD-10-CM | POA: Diagnosis not present

## 2021-11-23 DIAGNOSIS — G35 Multiple sclerosis: Secondary | ICD-10-CM | POA: Diagnosis not present

## 2021-11-24 DIAGNOSIS — G35 Multiple sclerosis: Secondary | ICD-10-CM | POA: Diagnosis not present

## 2021-11-25 DIAGNOSIS — G35 Multiple sclerosis: Secondary | ICD-10-CM | POA: Diagnosis not present

## 2021-11-26 DIAGNOSIS — G35 Multiple sclerosis: Secondary | ICD-10-CM | POA: Diagnosis not present

## 2021-11-27 DIAGNOSIS — G35 Multiple sclerosis: Secondary | ICD-10-CM | POA: Diagnosis not present

## 2021-11-28 DIAGNOSIS — G35 Multiple sclerosis: Secondary | ICD-10-CM | POA: Diagnosis not present

## 2021-11-29 DIAGNOSIS — G35 Multiple sclerosis: Secondary | ICD-10-CM | POA: Diagnosis not present

## 2021-11-30 DIAGNOSIS — G35 Multiple sclerosis: Secondary | ICD-10-CM | POA: Diagnosis not present

## 2021-12-01 DIAGNOSIS — G35 Multiple sclerosis: Secondary | ICD-10-CM | POA: Diagnosis not present

## 2021-12-02 ENCOUNTER — Non-Acute Institutional Stay (HOSPITAL_COMMUNITY)
Admission: RE | Admit: 2021-12-02 | Discharge: 2021-12-02 | Disposition: A | Discharge status: Home / Self Care | Payer: Medicaid Other | Source: Ambulatory Visit | Attending: Internal Medicine | Admitting: Internal Medicine

## 2021-12-02 DIAGNOSIS — Z79899 Other long term (current) drug therapy: Secondary | ICD-10-CM | POA: Diagnosis not present

## 2021-12-02 DIAGNOSIS — G35 Multiple sclerosis: Secondary | ICD-10-CM | POA: Insufficient documentation

## 2021-12-02 MED ORDER — SODIUM CHLORIDE 0.9 % IV SOLN: INTRAVENOUS | Status: DC | PRN

## 2021-12-02 MED ORDER — SODIUM CHLORIDE 0.9 % IV SOLN
300.0000 mg | INTRAVENOUS | Status: DC
  Administered 2021-12-02: 300 mg via INTRAVENOUS
  Filled 2021-12-02: qty 15

## 2021-12-02 NOTE — Progress Notes (Signed)
PATIENT CARE CENTER NOTE     Diagnosis: Multiple sclerosis (HCC) (G35); Relapsing remitting multiple sclerosis (HCC) (G35); High risk medication use (B34.356)      Provider: Despina Arias, MD     Procedure: Donnamarie Poag IV     Note: Patient received Tysabri infusion (dose 9 of 12) via PIV. Tolerated infusion well with no adverse reaction. Vital signs stable. Discharge instructions given. Patient declined to stay for the 1 hour post infusion observation. Patient advised to schedule next appointment at front desk. Alert, oriented and ambulatory at discharge.

## 2021-12-03 DIAGNOSIS — G35 Multiple sclerosis: Secondary | ICD-10-CM | POA: Diagnosis not present

## 2021-12-04 DIAGNOSIS — G35 Multiple sclerosis: Secondary | ICD-10-CM | POA: Diagnosis not present

## 2021-12-05 DIAGNOSIS — G35 Multiple sclerosis: Secondary | ICD-10-CM | POA: Diagnosis not present

## 2021-12-06 DIAGNOSIS — G35 Multiple sclerosis: Secondary | ICD-10-CM | POA: Diagnosis not present

## 2021-12-07 DIAGNOSIS — G35 Multiple sclerosis: Secondary | ICD-10-CM | POA: Diagnosis not present

## 2021-12-08 DIAGNOSIS — R269 Unspecified abnormalities of gait and mobility: Secondary | ICD-10-CM | POA: Diagnosis not present

## 2021-12-08 DIAGNOSIS — G35 Multiple sclerosis: Secondary | ICD-10-CM | POA: Diagnosis not present

## 2021-12-09 DIAGNOSIS — G35 Multiple sclerosis: Secondary | ICD-10-CM | POA: Diagnosis not present

## 2021-12-10 DIAGNOSIS — G35 Multiple sclerosis: Secondary | ICD-10-CM | POA: Diagnosis not present

## 2021-12-11 DIAGNOSIS — G35 Multiple sclerosis: Secondary | ICD-10-CM | POA: Diagnosis not present

## 2021-12-12 ENCOUNTER — Encounter: Payer: Self-pay | Admitting: Adult Health

## 2021-12-12 ENCOUNTER — Ambulatory Visit: Payer: Medicaid Other | Admitting: Adult Health

## 2021-12-12 VITALS — BP 133/82 | HR 117 | Ht 71.0 in | Wt 185.2 lb

## 2021-12-12 DIAGNOSIS — R3911 Hesitancy of micturition: Secondary | ICD-10-CM | POA: Diagnosis not present

## 2021-12-12 DIAGNOSIS — G35 Multiple sclerosis: Secondary | ICD-10-CM

## 2021-12-12 DIAGNOSIS — R261 Paralytic gait: Secondary | ICD-10-CM | POA: Diagnosis not present

## 2021-12-12 NOTE — Progress Notes (Signed)
PATIENT: Bradley Brandt DOB: [birth year]  REASON FOR VISIT: follow up HISTORY FROM: patient PRIMARY NEUROLOGIST: Dr. Epimenio Foot  Chief Complaint  Patient presents with   Follow-up    Pt in 5   Pt is here for MS follow up  Pt states he feels good nothing has changed since last visit . No questions or concerns for this visit      HISTORY OF PRESENT ILLNESS: Today 12/12/21:  [name] is a 41 year old male with relapse remitting multiple sclerosis. He returns today for FU. Remains on Tysabri- last JCV was negative 0.18.   Gait/Balance: Continues to use Dalfampridine. Some days his gait is good and other days he may have more trouble. Uses a cane with ambulating most of the times. No falls.  Sensory/strength: Denies any new numbness/ tingling or weakness. Remains on baclofen for spasticity. Feels that that has been helpful.  Vision: reports there has been changes in his vision. Can't see without his glasses. Has not seen opthamalogist/optometrist  Bowels/bladder:  no changes with bowels or bladder. Uses flomax- only once a day  Reports that he smokes marijuana- is asking for a prescription because "weed is getting too expensive."  HISTORY Update 05/16/2021:   He is on Tysabri and gets infusions at [facility].   He tolerates it well.   Last JCV Ab was negative at 0.16 on 05/26/2020.     He feels he is stable for the most part.    He has trouble words out and has a mildly slurred speech.   He has had that x several years byt feels it is worse.    He actually feels his gait has done better.   He is using a cane but can walk 100 feet or more without it.   He has a left  foot drop and spastic gait.    This is better than last year.    He denies arm weakness or spasticity   No numbness or tingling   Bladder function is the same with some urgency as well as hesitancy (helped by tamsulosin)     He is on baclofen for spasticity and dalfampridine for the walking.      Vision is doing the same.   He  feel she needs new glasses       He feels fatigued but this is stable.   He notes some depression, similar to earlier in the year.   He has more anxiety.   He is seeing psychiatry again.   He was diagnosed with bipolar disease   He is sleeping worse.Bradley Brandt   He notes cognition is about the same.  Focus is sometimes poor.            MS history: Bradley Brandt was diagnosed around 2001 when he presented with right-sided numbness and clumsiness and weakness.   Initially, he was on Avonex and then Rebif but had relapses on both of them.  He could not tolerate Tecfidera.   He was started on Tysabri about 1 year ago and his last infusion was at [facility] at 11/08/2015.  He was seen Dr. Gaynelle Adu [facility] and those records were reviewed. He did receive a course of Solu-Medrol March 2015 and another course of Solu-Medrol January 2016 when he presented with left leg weakness and numbness that was new    An MRI of the brain 08/28/2013 personally reviewed shows old extensive white matter changes in both hemispheres with multiple sclerosis.  There were new areas of restricted diffusion without definite enhancement that could be consistent with ongoing demyelination when compared to the MRI from 05/13/2013.   Imaging: MRI of the brain 08/30/2019 showed multiple T2/FLAIR hyperintense foci in the hemispheres, pons, cerebellum and thalamus in a pattern and configuration consistent with chronic demyelinating plaque associated with multiple sclerosis.  None of the foci appear to be acute and they do not enhance.  Compared to the MRI dated 01/14/2019, there is no interval progression.   MRI of the cervical spine 01/06/2018 showed T2 hyperintense foci within the spinal cord posteriorly adjacent to C2, to the right adjacent to C3, to the left adjacent to C4 and centrally and to the right adjacent to C4-C5 and C5.   None of these appear to be acute and they were all present on the 2015 MRI.Bradley Brandt     MRI of the brain  01/06/2018 showed multiple T2/FLAIR hyperintense foci in the hemispheres and in the left middle cerebellar peduncle consistent with chronic demyelinating plaque associated with multiple sclerosis.  None of the foci appears to be acute.  None of them enhance.  However, when compared to the 2015 MRI focus in the cerebellum and a couple foci in the hemispheres were not clearly present on the earlier MRI.     REVIEW OF SYSTEMS: Out of a complete 14 system review of symptoms, the patient complains only of the following symptoms, and all other reviewed systems are negative.  ALLERGIES: Allergies  Allergen Reactions   Lithium Rash    HOME MEDICATIONS: Outpatient Medications Prior to Visit  Medication Sig Dispense Refill   acetaminophen (TYLENOL) 500 MG tablet Take 500 mg by mouth 2 (two) times daily.     baclofen (LIORESAL) 20 MG tablet Take 1 tablet (20 mg total) by mouth 3 (three) times daily. 90 tablet 11   dalfampridine 10 MG TB12 Take 1 tablet by mouth every 12 hours. 60 tablet 11   DULoxetine (CYMBALTA) 30 MG capsule Take 30 mg by mouth every morning.     escitalopram (LEXAPRO) 10 MG tablet TAKE 1 TABLET BY MOUTH EVERY DAY 90 tablet 1   gabapentin (NEURONTIN) 800 MG tablet Take 1 tablet (800 mg total) by mouth 2 (two) times daily. 60 tablet 1   hydrOXYzine (ATARAX) 50 MG tablet Take 1 tablet (50 mg total) by mouth 3 (three) times daily as needed. 90 tablet 2   lidocaine (HM LIDOCAINE PATCH) 4 % Place 1 patch onto the skin daily. 15 patch 0   natalizumab (TYSABRI) 300 MG/15ML injection Inject 15 mLs (300 mg total) into the vein every 28 (twenty-eight) days. 15 mL 5   QUEtiapine (SEROQUEL) 200 MG tablet Take 1 tablet (200 mg total) by mouth at bedtime. 90 tablet 2   tamsulosin (FLOMAX) 0.4 MG CAPS capsule Take 2 capsules (0.8 mg total) by mouth daily. 60 capsule 11   varenicline (CHANTIX) 1 MG tablet Take 1 tablet (1 mg total) by mouth 2 (two) times daily. 60 tablet 5   Vitamin D,  Ergocalciferol, (DRISDOL) 1.25 MG (50000 UNIT) CAPS capsule TAKE 1 CAPSULE (50,000 UNITS TOTAL) BY MOUTH EVERY 7 (SEVEN) DAYS. 12 capsule 4   methylPREDNISolone (MEDROL DOSEPAK) 4 MG TBPK tablet 6 pills po x 1 day, then 5 pills po x 1 day, then 4 pills po x 1 day, then 3 pills po x 1 day, then 2 pills po x 1 day, then 1 pill po x 1 day 21 tablet 0   No facility-administered  medications prior to visit.    PAST MEDICAL HISTORY: Past Medical History:  Diagnosis Date   Bipolar 1 disorder (HCC)    Bipolar disorder, unspecified (HCC) 01/03/2013   MS (multiple sclerosis) (HCC) dx 2006   relapsing-remitting right sided weakness   Neuromuscular disorder (HCC)    MS    PAST SURGICAL HISTORY: Past Surgical History:  Procedure Laterality Date   TUMOR REMOVAL  1983   abdomen benign    FAMILY HISTORY: Family History  Adopted: Yes  Problem Relation Age of Onset   Lupus Mother    Ataxia Neg Hx    Chorea Neg Hx    Dementia Neg Hx    Mental retardation Neg Hx    Migraines Neg Hx    Multiple sclerosis Neg Hx    Neurofibromatosis Neg Hx    Neuropathy Neg Hx    Parkinsonism Neg Hx    Seizures Neg Hx    Stroke Neg Hx     SOCIAL HISTORY: Social History   Socioeconomic History   Marital status: Single    Spouse name: Not on file   Number of children: Not on file   Years of education: Not on file   Highest education level: Not on file  Occupational History   Not on file  Tobacco Use   Smoking status: Every Day    Packs/day: 1.00    Types: Cigarettes   Smokeless tobacco: Never  Vaping Use   Vaping Use: Former  Substance and Sexual Activity   Alcohol use: Not Currently    Alcohol/week: 14.0 - 21.0 standard drinks of alcohol    Types: 14 - 21 Shots of liquor per week   Drug use: Not Currently    Frequency: 7.0 times per week    Types: Marijuana    Comment: daily   Sexual activity: Yes    Partners: Female    Birth control/protection: None  Other Topics Concern   Not on file   Social History Narrative   Not on file   Social Determinants of Health   Financial Resource Strain: Not on file  Food Insecurity: Not on file  Transportation Needs: Not on file  Physical Activity: Not on file  Stress: Not on file  Social Connections: Not on file  Intimate Partner Violence: Not on file      PHYSICAL EXAM  Vitals:   12/12/21 1402  BP: 133/82  Pulse: (!) 117  Weight: 185 lb 3.2 oz (84 kg)  Height: 5\' 11"  (1.803 m)   Body mass index is 25.83 kg/m.  Generalized: Well developed, in no acute distress   Neurological examination  Mentation: Alert oriented to time, place, history taking. Follows all commands speech and language fluent Cranial nerve II-XII: Pupils were equal round reactive to light. Extraocular movements were full, visual field were full on confrontational test. Facial sensation and strength were normal. Uvula tongue midline. Head turning and shoulder shrug  were normal and symmetric. Motor: The motor testing reveals 5 over 5 strength of all 4 extremities. Good symmetric motor tone is noted throughout.  Sensory: Sensory testing is intact to soft touch on all 4 extremities. No evidence of extinction is noted.  Coordination: Cerebellar testing reveals good finger-nose-finger and heel-to-shin bilaterally.  Gait and station: Gait is normal. Tandem gait is normal. Romberg is negative. No drift is seen.  Reflexes: Deep tendon reflexes are symmetric and normal bilaterally.   DIAGNOSTIC DATA (LABS, IMAGING, TESTING) - I reviewed patient records, labs, notes, testing and imaging  myself where available.  Lab Results  Component Value Date   WBC 10.0 05/17/2021   HGB 14.8 05/17/2021   HCT 43.4 05/17/2021   MCV 93 05/17/2021   PLT 181 05/17/2021      Component Value Date/Time   NA 141 04/13/2021 0942   K 4.8 04/13/2021 0942   CL 101 04/13/2021 0942   CO2 22 12/31/2017 0909   GLUCOSE 90 04/13/2021 0942   GLUCOSE 124 (H) 02/09/2014 1334   BUN 19  04/13/2021 0942   CREATININE 1.18 04/13/2021 0942   CALCIUM 9.9 04/13/2021 0942   PROT 7.2 04/13/2021 0942   ALBUMIN 4.9 04/13/2021 0942   AST 19 04/13/2021 0942   ALT 13 12/16/2015 1341   ALKPHOS 90 04/13/2021 0942   BILITOT 0.4 04/13/2021 0942   GFRNONAA 66 04/08/2020 1044   GFRAA 76 04/08/2020 1044   Lab Results  Component Value Date   CHOL 173 04/13/2021   HDL 37 (L) 04/13/2021   LDLCALC 114 (H) 04/13/2021   TRIG 119 04/13/2021   CHOLHDL 4.7 04/13/2021     ASSESSMENT AND PLAN 41 y.o. year old male  has a past medical history of Bipolar 1 disorder (HCC), Bipolar disorder, unspecified (HCC) (01/03/2013), MS (multiple sclerosis) (HCC) (dx 2006), and Neuromuscular disorder (HCC). here with:  Multiple sclerosis  - Continue Tysabri  - blood work today- CBC, CMP, JCV - Continue Baclofen for spasticity 20 mg TID - Continue Dalfampridine 10 mg BID - COntinue Flomax 0.8 mg daily - use cane when ambulating - FU in 6 months are sooner if needed.     Butch Penny, MSN, NP-C 12/12/2021, 2:01 PM [facility] [address], [address] [place], [place] [address] [phone]

## 2021-12-13 DIAGNOSIS — G35 Multiple sclerosis: Secondary | ICD-10-CM | POA: Diagnosis not present

## 2021-12-14 DIAGNOSIS — G35 Multiple sclerosis: Secondary | ICD-10-CM | POA: Diagnosis not present

## 2021-12-15 DIAGNOSIS — G35 Multiple sclerosis: Secondary | ICD-10-CM | POA: Diagnosis not present

## 2021-12-16 DIAGNOSIS — G35 Multiple sclerosis: Secondary | ICD-10-CM | POA: Diagnosis not present

## 2021-12-17 DIAGNOSIS — G35 Multiple sclerosis: Secondary | ICD-10-CM | POA: Diagnosis not present

## 2021-12-18 DIAGNOSIS — G35 Multiple sclerosis: Secondary | ICD-10-CM | POA: Diagnosis not present

## 2021-12-19 DIAGNOSIS — G35 Multiple sclerosis: Secondary | ICD-10-CM | POA: Diagnosis not present

## 2021-12-20 DIAGNOSIS — G35 Multiple sclerosis: Secondary | ICD-10-CM | POA: Diagnosis not present

## 2021-12-21 DIAGNOSIS — G35 Multiple sclerosis: Secondary | ICD-10-CM | POA: Diagnosis not present

## 2021-12-21 LAB — COMPREHENSIVE METABOLIC PANEL
ALT: 22 IU/L (ref 0–44)
AST: 21 IU/L (ref 0–40)
Albumin/Globulin Ratio: 1.8 (ref 1.2–2.2)
Albumin: 4.8 g/dL (ref 4.0–5.0)
Alkaline Phosphatase: 100 IU/L (ref 44–121)
BUN/Creatinine Ratio: 20 (ref 9–20)
BUN: 24 mg/dL (ref 6–24)
Bilirubin Total: 0.3 mg/dL (ref 0.0–1.2)
CO2: 23 mmol/L (ref 20–29)
Calcium: 10.5 mg/dL — ABNORMAL HIGH (ref 8.7–10.2)
Chloride: 105 mmol/L (ref 96–106)
Creatinine, Ser: 1.23 mg/dL (ref 0.76–1.27)
Globulin, Total: 2.6 g/dL (ref 1.5–4.5)
Glucose: 87 mg/dL (ref 70–99)
Potassium: 4.4 mmol/L (ref 3.5–5.2)
Sodium: 143 mmol/L (ref 134–144)
Total Protein: 7.4 g/dL (ref 6.0–8.5)
eGFR: 75 mL/min/{1.73_m2} (ref >59)

## 2021-12-21 LAB — CBC WITH DIFFERENTIAL/PLATELET
Basophils Absolute: 0.1 10*3/uL (ref 0.0–0.2)
Basos: 1 %
EOS (ABSOLUTE): 0.2 10*3/uL (ref 0.0–0.4)
Eos: 2 %
Hematocrit: 45.4 % (ref 37.5–51.0)
Hemoglobin: 15.7 g/dL (ref 13.0–17.7)
Immature Grans (Abs): 0 10*3/uL (ref 0.0–0.1)
Immature Granulocytes: 0 %
Lymphocytes Absolute: 3.6 10*3/uL — ABNORMAL HIGH (ref 0.7–3.1)
Lymphs: 38 %
MCH: 31.7 pg (ref 26.6–33.0)
MCHC: 34.6 g/dL (ref 31.5–35.7)
MCV: 92 fL (ref 79–97)
Monocytes Absolute: 0.5 10*3/uL (ref 0.1–0.9)
Monocytes: 5 %
NRBC: 1 % — ABNORMAL HIGH (ref 0–0)
Neutrophils Absolute: 5.1 10*3/uL (ref 1.4–7.0)
Neutrophils: 54 %
Platelets: 189 10*3/uL (ref 150–450)
RBC: 4.96 x10E6/uL (ref 4.14–5.80)
RDW: 14.2 % (ref 11.6–15.4)
WBC: 9.5 10*3/uL (ref 3.4–10.8)

## 2021-12-21 LAB — STRATIFY JCV(TM) AB W/INDEX
JCV Antibody: NEGATIVE
JCV Index Value: 0.17

## 2021-12-22 DIAGNOSIS — G35 Multiple sclerosis: Secondary | ICD-10-CM | POA: Diagnosis not present

## 2021-12-23 DIAGNOSIS — G35 Multiple sclerosis: Secondary | ICD-10-CM | POA: Diagnosis not present

## 2021-12-24 DIAGNOSIS — G35 Multiple sclerosis: Secondary | ICD-10-CM | POA: Diagnosis not present

## 2021-12-25 DIAGNOSIS — G35 Multiple sclerosis: Secondary | ICD-10-CM | POA: Diagnosis not present

## 2021-12-26 DIAGNOSIS — G35 Multiple sclerosis: Secondary | ICD-10-CM | POA: Diagnosis not present

## 2021-12-27 DIAGNOSIS — G35 Multiple sclerosis: Secondary | ICD-10-CM | POA: Diagnosis not present

## 2021-12-28 DIAGNOSIS — G35 Multiple sclerosis: Secondary | ICD-10-CM | POA: Diagnosis not present

## 2021-12-29 DIAGNOSIS — G35 Multiple sclerosis: Secondary | ICD-10-CM | POA: Diagnosis not present

## 2021-12-30 ENCOUNTER — Non-Acute Institutional Stay (HOSPITAL_COMMUNITY)
Admission: RE | Admit: 2021-12-30 | Discharge: 2021-12-30 | Disposition: A | Discharge status: Home / Self Care | Payer: Medicaid Other | Source: Ambulatory Visit | Attending: Internal Medicine | Admitting: Internal Medicine

## 2021-12-30 DIAGNOSIS — G35 Multiple sclerosis: Secondary | ICD-10-CM | POA: Insufficient documentation

## 2021-12-30 DIAGNOSIS — Z79899 Other long term (current) drug therapy: Secondary | ICD-10-CM | POA: Insufficient documentation

## 2021-12-30 MED ORDER — SODIUM CHLORIDE 0.9 % IV SOLN: INTRAVENOUS | Status: DC | PRN

## 2021-12-30 MED ORDER — SODIUM CHLORIDE 0.9 % IV SOLN
300.0000 mg | INTRAVENOUS | Status: DC
  Administered 2021-12-30: 300 mg via INTRAVENOUS
  Filled 2021-12-30: qty 15

## 2021-12-30 NOTE — Progress Notes (Signed)
PATIENT CARE CENTER NOTE     Diagnosis: Multiple sclerosis (HCC) (G35); Relapsing remitting multiple sclerosis (HCC) (G35); High risk medication use (L93.790)      Provider: Despina Arias, MD     Procedure: Donnamarie Poag IV     Note: Patient received Tysabri infusion (dose 10 of 12) via PIV. Tolerated infusion well with no adverse reaction. Vital signs stable. Discharge instructions given. Patient declined to stay for the 1 hour post infusion observation. Patient advised to schedule next appointment at front desk. Alert, oriented and ambulatory at discharge.

## 2021-12-31 DIAGNOSIS — G35 Multiple sclerosis: Secondary | ICD-10-CM | POA: Diagnosis not present

## 2022-01-01 DIAGNOSIS — G35 Multiple sclerosis: Secondary | ICD-10-CM | POA: Diagnosis not present

## 2022-01-02 DIAGNOSIS — G35 Multiple sclerosis: Secondary | ICD-10-CM | POA: Diagnosis not present

## 2022-01-03 DIAGNOSIS — G35 Multiple sclerosis: Secondary | ICD-10-CM | POA: Diagnosis not present

## 2022-01-04 DIAGNOSIS — G35 Multiple sclerosis: Secondary | ICD-10-CM | POA: Diagnosis not present

## 2022-01-05 DIAGNOSIS — G35 Multiple sclerosis: Secondary | ICD-10-CM | POA: Diagnosis not present

## 2022-01-06 DIAGNOSIS — G35 Multiple sclerosis: Secondary | ICD-10-CM | POA: Diagnosis not present

## 2022-01-07 DIAGNOSIS — G35 Multiple sclerosis: Secondary | ICD-10-CM | POA: Diagnosis not present

## 2022-01-08 DIAGNOSIS — G35 Multiple sclerosis: Secondary | ICD-10-CM | POA: Diagnosis not present

## 2022-01-09 DIAGNOSIS — G35 Multiple sclerosis: Secondary | ICD-10-CM | POA: Diagnosis not present

## 2022-01-10 ENCOUNTER — Telehealth (HOSPITAL_COMMUNITY): Payer: Self-pay | Admitting: *Deleted

## 2022-01-10 DIAGNOSIS — G35 Multiple sclerosis: Secondary | ICD-10-CM | POA: Diagnosis not present

## 2022-01-10 NOTE — Telephone Encounter (Signed)
Prior authorization completed on patients Quetiapine 200 mg. It is effective till 01/10/22 and the case number is [id number]. Pharmacy notified.

## 2022-01-11 ENCOUNTER — Ambulatory Visit: Payer: Self-pay | Admitting: Nurse Practitioner

## 2022-01-11 DIAGNOSIS — G35 Multiple sclerosis: Secondary | ICD-10-CM | POA: Diagnosis not present

## 2022-01-12 DIAGNOSIS — G35 Multiple sclerosis: Secondary | ICD-10-CM | POA: Diagnosis not present

## 2022-01-13 DIAGNOSIS — G35 Multiple sclerosis: Secondary | ICD-10-CM | POA: Diagnosis not present

## 2022-01-14 DIAGNOSIS — G35 Multiple sclerosis: Secondary | ICD-10-CM | POA: Diagnosis not present

## 2022-01-15 DIAGNOSIS — G35 Multiple sclerosis: Secondary | ICD-10-CM | POA: Diagnosis not present

## 2022-01-16 DIAGNOSIS — G35 Multiple sclerosis: Secondary | ICD-10-CM | POA: Diagnosis not present

## 2022-01-17 DIAGNOSIS — G35 Multiple sclerosis: Secondary | ICD-10-CM | POA: Diagnosis not present

## 2022-01-18 DIAGNOSIS — G35 Multiple sclerosis: Secondary | ICD-10-CM | POA: Diagnosis not present

## 2022-01-19 DIAGNOSIS — G35 Multiple sclerosis: Secondary | ICD-10-CM | POA: Diagnosis not present

## 2022-01-20 DIAGNOSIS — G35 Multiple sclerosis: Secondary | ICD-10-CM | POA: Diagnosis not present

## 2022-01-21 DIAGNOSIS — G35 Multiple sclerosis: Secondary | ICD-10-CM | POA: Diagnosis not present

## 2022-01-22 DIAGNOSIS — G35 Multiple sclerosis: Secondary | ICD-10-CM | POA: Diagnosis not present

## 2022-01-23 DIAGNOSIS — G35 Multiple sclerosis: Secondary | ICD-10-CM | POA: Diagnosis not present

## 2022-01-24 DIAGNOSIS — G35 Multiple sclerosis: Secondary | ICD-10-CM | POA: Diagnosis not present

## 2022-01-25 DIAGNOSIS — G35 Multiple sclerosis: Secondary | ICD-10-CM | POA: Diagnosis not present

## 2022-01-26 DIAGNOSIS — G35 Multiple sclerosis: Secondary | ICD-10-CM | POA: Diagnosis not present

## 2022-01-27 ENCOUNTER — Non-Acute Institutional Stay (HOSPITAL_COMMUNITY)
Admission: RE | Admit: 2022-01-27 | Discharge: 2022-01-27 | Disposition: A | Discharge status: Home / Self Care | Payer: Medicaid Other | Source: Ambulatory Visit | Attending: Internal Medicine | Admitting: Internal Medicine

## 2022-01-27 DIAGNOSIS — G35 Multiple sclerosis: Secondary | ICD-10-CM | POA: Insufficient documentation

## 2022-01-27 MED ORDER — SODIUM CHLORIDE 0.9 % IV SOLN
300.0000 mg | INTRAVENOUS | Status: DC
  Administered 2022-01-27: 300 mg via INTRAVENOUS
  Filled 2022-01-27: qty 15

## 2022-01-27 MED ORDER — SODIUM CHLORIDE 0.9 % IV SOLN: INTRAVENOUS | Status: DC | PRN

## 2022-01-27 NOTE — Progress Notes (Addendum)
PATIENT CARE CENTER NOTE   Diagnosis: Multiple Sclerosis       Provider: Despina Arias, MD     Procedure:  300 mgTysabri infusion     Note: Patient received Tysabri infusion ( dose #11 of 12) via PIV. No premeds required per order. Pt states his vision has been blurry x 4 months, states he needs to see his eye doctor about a new prescription for glasses, otherwise has no complaints or changes since last infusion and this issues is not new within the last month. Encouraged pt to schedule an appointment with optometrist, verbalized understanding. Attempted to notify clinical staff at provider's office via phone, however office closed on Friday. Tolerated infusion well with no adverse reaction. Vital signs stable. Discharge instructions given. Patient declined to stay for the 1 hour post-infusion observation. Pt to RTC on 9/8 for next infusion, aware of date and time. Alert, oriented and ambulatory at discharge.

## 2022-01-28 DIAGNOSIS — G35 Multiple sclerosis: Secondary | ICD-10-CM | POA: Diagnosis not present

## 2022-01-29 DIAGNOSIS — G35 Multiple sclerosis: Secondary | ICD-10-CM | POA: Diagnosis not present

## 2022-01-30 DIAGNOSIS — G35 Multiple sclerosis: Secondary | ICD-10-CM | POA: Diagnosis not present

## 2022-01-31 DIAGNOSIS — G35 Multiple sclerosis: Secondary | ICD-10-CM | POA: Diagnosis not present

## 2022-02-01 DIAGNOSIS — G35 Multiple sclerosis: Secondary | ICD-10-CM | POA: Diagnosis not present

## 2022-02-02 DIAGNOSIS — G35 Multiple sclerosis: Secondary | ICD-10-CM | POA: Diagnosis not present

## 2022-02-03 DIAGNOSIS — G35 Multiple sclerosis: Secondary | ICD-10-CM | POA: Diagnosis not present

## 2022-02-04 DIAGNOSIS — G35 Multiple sclerosis: Secondary | ICD-10-CM | POA: Diagnosis not present

## 2022-02-05 DIAGNOSIS — G35 Multiple sclerosis: Secondary | ICD-10-CM | POA: Diagnosis not present

## 2022-02-06 DIAGNOSIS — G35 Multiple sclerosis: Secondary | ICD-10-CM | POA: Diagnosis not present

## 2022-02-07 DIAGNOSIS — G35 Multiple sclerosis: Secondary | ICD-10-CM | POA: Diagnosis not present

## 2022-02-08 DIAGNOSIS — G35 Multiple sclerosis: Secondary | ICD-10-CM | POA: Diagnosis not present

## 2022-02-09 DIAGNOSIS — G35 Multiple sclerosis: Secondary | ICD-10-CM | POA: Diagnosis not present

## 2022-02-10 DIAGNOSIS — G35 Multiple sclerosis: Secondary | ICD-10-CM | POA: Diagnosis not present

## 2022-02-11 DIAGNOSIS — G35 Multiple sclerosis: Secondary | ICD-10-CM | POA: Diagnosis not present

## 2022-02-12 DIAGNOSIS — G35 Multiple sclerosis: Secondary | ICD-10-CM | POA: Diagnosis not present

## 2022-02-13 DIAGNOSIS — G35 Multiple sclerosis: Secondary | ICD-10-CM | POA: Diagnosis not present

## 2022-02-14 DIAGNOSIS — G35 Multiple sclerosis: Secondary | ICD-10-CM | POA: Diagnosis not present

## 2022-02-15 DIAGNOSIS — G35 Multiple sclerosis: Secondary | ICD-10-CM | POA: Diagnosis not present

## 2022-02-16 DIAGNOSIS — G35 Multiple sclerosis: Secondary | ICD-10-CM | POA: Diagnosis not present

## 2022-02-17 DIAGNOSIS — G35 Multiple sclerosis: Secondary | ICD-10-CM | POA: Diagnosis not present

## 2022-02-18 DIAGNOSIS — G35 Multiple sclerosis: Secondary | ICD-10-CM | POA: Diagnosis not present

## 2022-02-19 DIAGNOSIS — G35 Multiple sclerosis: Secondary | ICD-10-CM | POA: Diagnosis not present

## 2022-02-20 DIAGNOSIS — G35 Multiple sclerosis: Secondary | ICD-10-CM | POA: Diagnosis not present

## 2022-02-21 DIAGNOSIS — G35 Multiple sclerosis: Secondary | ICD-10-CM | POA: Diagnosis not present

## 2022-02-22 DIAGNOSIS — G35 Multiple sclerosis: Secondary | ICD-10-CM | POA: Diagnosis not present

## 2022-02-23 DIAGNOSIS — G35 Multiple sclerosis: Secondary | ICD-10-CM | POA: Diagnosis not present

## 2022-02-24 ENCOUNTER — Non-Acute Institutional Stay (HOSPITAL_COMMUNITY)
Admission: RE | Admit: 2022-02-24 | Discharge: 2022-02-24 | Disposition: A | Discharge status: Home / Self Care | Payer: Medicaid Other | Source: Ambulatory Visit | Attending: Internal Medicine | Admitting: Internal Medicine

## 2022-02-24 DIAGNOSIS — Z79899 Other long term (current) drug therapy: Secondary | ICD-10-CM | POA: Insufficient documentation

## 2022-02-24 DIAGNOSIS — G35 Multiple sclerosis: Secondary | ICD-10-CM | POA: Insufficient documentation

## 2022-02-24 MED ORDER — SODIUM CHLORIDE 0.9 % IV SOLN: INTRAVENOUS | Status: DC | PRN

## 2022-02-24 MED ORDER — SODIUM CHLORIDE 0.9 % IV SOLN
300.0000 mg | INTRAVENOUS | Status: DC
  Administered 2022-02-24: 300 mg via INTRAVENOUS
  Filled 2022-02-24: qty 15

## 2022-02-24 NOTE — Progress Notes (Signed)
PATIENT CARE CENTER NOTE     Diagnosis: Multiple sclerosis (HCC) (G35); Relapsing remitting multiple sclerosis (HCC) (G35); High risk medication use (E32.122)      Provider: Despina Arias, MD     Procedure: Donnamarie Poag IV     Note: Patient received Tysabri infusion (dose 12 of 12) via PIV. Tolerated infusion well with no adverse reaction. Vital signs stable. Discharge instructions given. Patient declined to stay for the 1 hour post infusion observation. Patient advised to schedule next appointment at front desk. Alert, oriented and ambulatory at discharge.

## 2022-02-25 DIAGNOSIS — G35 Multiple sclerosis: Secondary | ICD-10-CM | POA: Diagnosis not present

## 2022-02-26 DIAGNOSIS — G35 Multiple sclerosis: Secondary | ICD-10-CM | POA: Diagnosis not present

## 2022-02-27 DIAGNOSIS — G35 Multiple sclerosis: Secondary | ICD-10-CM | POA: Diagnosis not present

## 2022-02-28 ENCOUNTER — Telehealth: Payer: Self-pay | Admitting: Adult Health

## 2022-02-28 DIAGNOSIS — G35 Multiple sclerosis: Secondary | ICD-10-CM | POA: Diagnosis not present

## 2022-02-28 NOTE — Telephone Encounter (Signed)
Marchelle Folks from [facility] called stating that the pt is coming up for a reauthorization for his personal care service form to be signed and she is wanting to know if his provider her will be signing it. This form will exp on 11/17

## 2022-03-01 DIAGNOSIS — G35 Multiple sclerosis: Secondary | ICD-10-CM | POA: Diagnosis not present

## 2022-03-01 NOTE — Telephone Encounter (Signed)
Was completed/signed by Dr. Epimenio Foot 10-26-2020.

## 2022-03-01 NOTE — Telephone Encounter (Signed)
Yes I think so. We can look it over to make sure its appropriate

## 2022-03-01 NOTE — Telephone Encounter (Signed)
Called amanda and she will fax over to [place] relating to filling out the form, which is due in 05-05-2022.  Has appt with Dr. Epimenio Foot 06-22-2022.

## 2022-03-02 DIAGNOSIS — G35 Multiple sclerosis: Secondary | ICD-10-CM | POA: Diagnosis not present

## 2022-03-03 DIAGNOSIS — G35 Multiple sclerosis: Secondary | ICD-10-CM | POA: Diagnosis not present

## 2022-03-04 DIAGNOSIS — G35 Multiple sclerosis: Secondary | ICD-10-CM | POA: Diagnosis not present

## 2022-03-05 DIAGNOSIS — G35 Multiple sclerosis: Secondary | ICD-10-CM | POA: Diagnosis not present

## 2022-03-06 DIAGNOSIS — G35 Multiple sclerosis: Secondary | ICD-10-CM | POA: Diagnosis not present

## 2022-03-07 DIAGNOSIS — G35 Multiple sclerosis: Secondary | ICD-10-CM | POA: Diagnosis not present

## 2022-03-08 DIAGNOSIS — G35 Multiple sclerosis: Secondary | ICD-10-CM | POA: Diagnosis not present

## 2022-03-09 ENCOUNTER — Other Ambulatory Visit: Payer: Self-pay | Admitting: *Deleted

## 2022-03-09 ENCOUNTER — Other Ambulatory Visit (HOSPITAL_COMMUNITY): Payer: Self-pay | Admitting: *Deleted

## 2022-03-09 DIAGNOSIS — G35 Multiple sclerosis: Secondary | ICD-10-CM

## 2022-03-09 MED ORDER — SODIUM CHLORIDE 0.9 % IV SOLN: 300.0000 mg | INTRAVENOUS | Status: DC

## 2022-03-10 DIAGNOSIS — G35 Multiple sclerosis: Secondary | ICD-10-CM | POA: Diagnosis not present

## 2022-03-11 DIAGNOSIS — G35 Multiple sclerosis: Secondary | ICD-10-CM | POA: Diagnosis not present

## 2022-03-12 DIAGNOSIS — G35 Multiple sclerosis: Secondary | ICD-10-CM | POA: Diagnosis not present

## 2022-03-13 DIAGNOSIS — G35 Multiple sclerosis: Secondary | ICD-10-CM | POA: Diagnosis not present

## 2022-03-14 DIAGNOSIS — G35 Multiple sclerosis: Secondary | ICD-10-CM | POA: Diagnosis not present

## 2022-03-14 NOTE — Telephone Encounter (Addendum)
Form completed and signed. (Request for independent assessment for personal care services.  Fax confirmation received [phone].  [phone]).  Copy to med records).

## 2022-03-15 DIAGNOSIS — G35 Multiple sclerosis: Secondary | ICD-10-CM | POA: Diagnosis not present

## 2022-03-16 DIAGNOSIS — G35 Multiple sclerosis: Secondary | ICD-10-CM | POA: Diagnosis not present

## 2022-03-17 DIAGNOSIS — G35 Multiple sclerosis: Secondary | ICD-10-CM | POA: Diagnosis not present

## 2022-03-18 DIAGNOSIS — G35 Multiple sclerosis: Secondary | ICD-10-CM | POA: Diagnosis not present

## 2022-03-19 DIAGNOSIS — G35 Multiple sclerosis: Secondary | ICD-10-CM | POA: Diagnosis not present

## 2022-03-20 DIAGNOSIS — G35 Multiple sclerosis: Secondary | ICD-10-CM | POA: Diagnosis not present

## 2022-03-21 DIAGNOSIS — G35 Multiple sclerosis: Secondary | ICD-10-CM | POA: Diagnosis not present

## 2022-03-22 DIAGNOSIS — G35 Multiple sclerosis: Secondary | ICD-10-CM | POA: Diagnosis not present

## 2022-03-23 DIAGNOSIS — G35 Multiple sclerosis: Secondary | ICD-10-CM | POA: Diagnosis not present

## 2022-03-24 ENCOUNTER — Non-Acute Institutional Stay (HOSPITAL_COMMUNITY)
Admission: RE | Admit: 2022-03-24 | Discharge: 2022-03-24 | Disposition: A | Discharge status: Home / Self Care | Payer: Medicaid Other | Source: Ambulatory Visit | Attending: Internal Medicine | Admitting: Internal Medicine

## 2022-03-24 DIAGNOSIS — G35 Multiple sclerosis: Secondary | ICD-10-CM | POA: Diagnosis not present

## 2022-03-24 MED ORDER — SODIUM CHLORIDE 0.9 % IV SOLN
300.0000 mg | INTRAVENOUS | Status: DC
  Administered 2022-03-24: 300 mg via INTRAVENOUS
  Filled 2022-03-24: qty 15

## 2022-03-24 MED ORDER — SODIUM CHLORIDE 0.9 % IV SOLN: INTRAVENOUS | Status: DC | PRN

## 2022-03-24 NOTE — Progress Notes (Signed)
   PATIENT CARE CENTER NOTE    Diagnosis:MS (multiple sclerosis) ([place]) [G35]  Provider: Arlice Colt, MD  Procedure: Fritzi Mandes IV  Note: Patient received Tysabri infusion (dose #1 of 5) via PIV. Tolerated infusion well with no adverse reaction. Vital signs stable. Discharge instructions given. Patient declined to stay for the 1 hour post infusion observation. Next appointment scheduled. Alert, oriented and ambulatory at discharge.

## 2022-03-25 DIAGNOSIS — G35 Multiple sclerosis: Secondary | ICD-10-CM | POA: Diagnosis not present

## 2022-03-26 DIAGNOSIS — G35 Multiple sclerosis: Secondary | ICD-10-CM | POA: Diagnosis not present

## 2022-03-27 DIAGNOSIS — G35 Multiple sclerosis: Secondary | ICD-10-CM | POA: Diagnosis not present

## 2022-03-28 DIAGNOSIS — G35 Multiple sclerosis: Secondary | ICD-10-CM | POA: Diagnosis not present

## 2022-03-29 DIAGNOSIS — G35 Multiple sclerosis: Secondary | ICD-10-CM | POA: Diagnosis not present

## 2022-03-30 DIAGNOSIS — G35 Multiple sclerosis: Secondary | ICD-10-CM | POA: Diagnosis not present

## 2022-03-31 DIAGNOSIS — G35 Multiple sclerosis: Secondary | ICD-10-CM | POA: Diagnosis not present

## 2022-04-01 DIAGNOSIS — G35 Multiple sclerosis: Secondary | ICD-10-CM | POA: Diagnosis not present

## 2022-04-02 DIAGNOSIS — G35 Multiple sclerosis: Secondary | ICD-10-CM | POA: Diagnosis not present

## 2022-04-03 DIAGNOSIS — G35 Multiple sclerosis: Secondary | ICD-10-CM | POA: Diagnosis not present

## 2022-04-04 DIAGNOSIS — G35 Multiple sclerosis: Secondary | ICD-10-CM | POA: Diagnosis not present

## 2022-04-05 DIAGNOSIS — G35 Multiple sclerosis: Secondary | ICD-10-CM | POA: Diagnosis not present

## 2022-04-06 DIAGNOSIS — G35 Multiple sclerosis: Secondary | ICD-10-CM | POA: Diagnosis not present

## 2022-04-07 DIAGNOSIS — G35 Multiple sclerosis: Secondary | ICD-10-CM | POA: Diagnosis not present

## 2022-04-08 DIAGNOSIS — G35 Multiple sclerosis: Secondary | ICD-10-CM | POA: Diagnosis not present

## 2022-04-09 DIAGNOSIS — G35 Multiple sclerosis: Secondary | ICD-10-CM | POA: Diagnosis not present

## 2022-04-17 ENCOUNTER — Telehealth: Payer: Self-pay | Admitting: Neurology

## 2022-04-17 DIAGNOSIS — G35 Multiple sclerosis: Secondary | ICD-10-CM | POA: Diagnosis not present

## 2022-04-17 NOTE — Telephone Encounter (Signed)
LVM and sent mychart msg informing pt of need to reschedule 06/22/22 appointment - MD out 

## 2022-04-18 DIAGNOSIS — G35 Multiple sclerosis: Secondary | ICD-10-CM | POA: Diagnosis not present

## 2022-04-19 DIAGNOSIS — G35 Multiple sclerosis: Secondary | ICD-10-CM | POA: Diagnosis not present

## 2022-04-20 DIAGNOSIS — G35 Multiple sclerosis: Secondary | ICD-10-CM | POA: Diagnosis not present

## 2022-04-21 ENCOUNTER — Non-Acute Institutional Stay (HOSPITAL_COMMUNITY)
Admission: RE | Admit: 2022-04-21 | Discharge: 2022-04-21 | Disposition: A | Discharge status: Home / Self Care | Payer: Medicaid Other | Source: Ambulatory Visit | Attending: Internal Medicine | Admitting: Internal Medicine

## 2022-04-21 DIAGNOSIS — G35 Multiple sclerosis: Secondary | ICD-10-CM | POA: Insufficient documentation

## 2022-04-21 MED ORDER — SODIUM CHLORIDE 0.9 % IV SOLN: INTRAVENOUS | Status: DC | PRN

## 2022-04-21 MED ORDER — SODIUM CHLORIDE 0.9 % IV SOLN
300.0000 mg | INTRAVENOUS | Status: DC
  Administered 2022-04-21: 300 mg via INTRAVENOUS
  Filled 2022-04-21: qty 15

## 2022-04-21 NOTE — Progress Notes (Signed)
PATIENT CARE CENTER NOTE  Diagnosis: MS (multiple sclerosis) ([place]) [G35]    Provider: Arlice Colt, MD  Procedure: Fritzi Mandes IV  Note: Patient received Tysabri infusion (dose #2 of 5) via PIV. Tolerated infusion well with no adverse reaction. Vital signs stable. Discharge instructions given. Patient declined to stay for the 1 hour post infusion observation. Patient advised to schedule next appointment at front desk. Alert, oriented and ambulatory at discharge.

## 2022-04-22 DIAGNOSIS — G35 Multiple sclerosis: Secondary | ICD-10-CM | POA: Diagnosis not present

## 2022-04-23 DIAGNOSIS — G35 Multiple sclerosis: Secondary | ICD-10-CM | POA: Diagnosis not present

## 2022-04-24 ENCOUNTER — Other Ambulatory Visit (HOSPITAL_COMMUNITY): Payer: Self-pay | Admitting: Physician Assistant

## 2022-04-24 DIAGNOSIS — F319 Bipolar disorder, unspecified: Secondary | ICD-10-CM

## 2022-04-24 DIAGNOSIS — G35 Multiple sclerosis: Secondary | ICD-10-CM | POA: Diagnosis not present

## 2022-04-24 NOTE — Telephone Encounter (Signed)
CALLED PT AND ADVISED THAT HE WILL NEED TO COME IN FOR AN APPT. HE HAS NOT BEEN SEEN SINCE 07/2021. HE WILL COME IN AS WALK IN TOMORROW.

## 2022-04-25 DIAGNOSIS — G35 Multiple sclerosis: Secondary | ICD-10-CM | POA: Diagnosis not present

## 2022-04-26 ENCOUNTER — Ambulatory Visit (HOSPITAL_COMMUNITY): Payer: Medicaid Other | Admitting: Student in an Organized Health Care Education/Training Program

## 2022-04-26 VITALS — BP 127/78 | HR 60 | Ht 71.0 in | Wt 184.0 lb

## 2022-04-26 DIAGNOSIS — G35 Multiple sclerosis: Secondary | ICD-10-CM

## 2022-04-26 DIAGNOSIS — F319 Bipolar disorder, unspecified: Secondary | ICD-10-CM

## 2022-04-26 DIAGNOSIS — F411 Generalized anxiety disorder: Secondary | ICD-10-CM

## 2022-04-26 MED ORDER — ESCITALOPRAM OXALATE 10 MG PO TABS: 10.0000 mg | ORAL_TABLET | Freq: Every day | ORAL | 1 refills | Status: DC

## 2022-04-26 MED ORDER — QUETIAPINE FUMARATE 200 MG PO TABS: 200.0000 mg | ORAL_TABLET | Freq: Every day | ORAL | 2 refills | Status: DC

## 2022-04-26 MED ORDER — GABAPENTIN 800 MG PO TABS: 800.0000 mg | ORAL_TABLET | Freq: Two times a day (BID) | ORAL | 1 refills | Status: DC

## 2022-04-26 MED ORDER — HYDROXYZINE HCL 50 MG PO TABS: 50.0000 mg | ORAL_TABLET | Freq: Three times a day (TID) | ORAL | 2 refills | Status: DC | PRN

## 2022-04-26 NOTE — Progress Notes (Signed)
BH MD/PA/NP OP Progress Note  04/27/2022 7:40 AM Bradley Brandt  MRN:  [id number]  Chief Complaint:  Chief Complaint  Patient presents with   Follow-up   Medication Refill   HPI: Bradley Brandt is a 41 year old male who presents for follow-up and medication management.  PPHx is significant for Bipolar Disorder, Insomnia, and GAD.  PMH is significant for MS.   He reports that he did well on his medications however he ran out and is in need of refills.  He reports no side effects from his medications.  When asked how long he has been out of his medications he reports about 1 month for his Seroquel and Lexapro.  He reports his main issue is pain from his MS and asked for a referral to a pain clinic.  Discussed sending a referral to a pain clinic would be placed.  Discussed the need to taper his Seroquel as he had been off of it for over a month.  Discussed with him that since he does not have an EKG he should not restart the Lexapro until he can get that.  Encouraged him to call his PCP to obtain this and he reports that he will.  He reports no SI, HI, or AVH.  He reports his sleep is poor.  He reports his appetite is fair.  He reports no other concerns at present.    Visit Diagnosis:    ICD-10-CM   1. Bipolar affective disorder, remission status unspecified (HCC)  F31.9 QUEtiapine (SEROQUEL) 200 MG tablet    DISCONTINUED: escitalopram (LEXAPRO) 10 MG tablet    2. Generalized anxiety disorder  F41.1 gabapentin (NEURONTIN) 800 MG tablet    DISCONTINUED: hydrOXYzine (ATARAX) 50 MG tablet    DISCONTINUED: escitalopram (LEXAPRO) 10 MG tablet    3. GAD (generalized anxiety disorder)  F41.1     4. MS (multiple sclerosis) ([place])  [facility] referral to [facility] Clinic      Past Psychiatric History: Bipolar Disorder, Insomnia, and GAD.  Past Medical History:  Past Medical History:  Diagnosis Date   Bipolar 1 disorder ([place])    Bipolar disorder, unspecified ([place]) 01/03/2013   MS (multiple  sclerosis) ([place]) dx 2006   relapsing-remitting right sided weakness   Neuromuscular disorder ([place])    MS    Past Surgical History:  Procedure Laterality Date   TUMOR REMOVAL  1983   abdomen benign    Family Psychiatric History: No known family history of psychiatric illness   Family History:  Family History  Adopted: Yes  Problem Relation Age of Onset   Lupus Mother    Ataxia Neg Hx    Chorea Neg Hx    Dementia Neg Hx    Mental retardation Neg Hx    Migraines Neg Hx    Multiple sclerosis Neg Hx    Neurofibromatosis Neg Hx    Neuropathy Neg Hx    Parkinsonism Neg Hx    Seizures Neg Hx    Stroke Neg Hx     Social History:  Social History   Socioeconomic History   Marital status: Single    Spouse name: Not on file   Number of children: Not on file   Years of education: Not on file   Highest education level: Not on file  Occupational History   Not on file  Tobacco Use   Smoking status: Every Day    Packs/day: 1.00    Types: Cigarettes   Smokeless tobacco: Never  Vaping Use  Vaping Use: Former  Substance and Sexual Activity   Alcohol use: Not Currently    Alcohol/week: 72.0 standard drinks of alcohol    Types: 10 Glasses of wine, 12 Cans of beer, 50 Shots of liquor per week   Drug use: Not Currently    Frequency: 7.0 times per week    Types: Marijuana    Comment: daily   Sexual activity: Yes    Partners: Female    Birth control/protection: None  Other Topics Concern   Not on file  Social History Narrative   Not on file   Social Determinants of Health   Financial Resource Strain: Not on file  Food Insecurity: Not on file  Transportation Needs: Not on file  Physical Activity: Not on file  Stress: Not on file  Social Connections: Not on file    Allergies:  Allergies  Allergen Reactions   Lithium Rash    Metabolic Disorder Labs: No results found for: "HGBA1C", "MPG" No results found for: "PROLACTIN" Lab Results  Component Value Date   CHOL  173 04/13/2021   TRIG 119 04/13/2021   HDL 37 (L) 04/13/2021   CHOLHDL 4.7 04/13/2021   LDLCALC 114 (H) 04/13/2021   LDLCALC 136 (H) 04/08/2020   No results found for: "TSH"  Therapeutic Level Labs: No results found for: "LITHIUM" No results found for: "VALPROATE" No results found for: "CBMZ"  Current Medications: Current Outpatient Medications  Medication Sig Dispense Refill   acetaminophen (TYLENOL) 500 MG tablet Take 500 mg by mouth 2 (two) times daily.     baclofen (LIORESAL) 20 MG tablet Take 1 tablet (20 mg total) by mouth 3 (three) times daily. 90 tablet 11   dalfampridine 10 MG TB12 Take 1 tablet by mouth every 12 hours. 60 tablet 11   DULoxetine (CYMBALTA) 30 MG capsule Take 30 mg by mouth every morning.     gabapentin (NEURONTIN) 800 MG tablet Take 1 tablet (800 mg total) by mouth 2 (two) times daily. 60 tablet 1   lidocaine (HM LIDOCAINE PATCH) 4 % Place 1 patch onto the skin daily. 15 patch 0   methylPREDNISolone (MEDROL DOSEPAK) 4 MG TBPK tablet 6 pills po x 1 day, then 5 pills po x 1 day, then 4 pills po x 1 day, then 3 pills po x 1 day, then 2 pills po x 1 day, then 1 pill po x 1 day 21 tablet 0   natalizumab (TYSABRI) 300 MG/15ML injection Inject 15 mLs (300 mg total) into the vein every 28 (twenty-eight) days. 15 mL 5   QUEtiapine (SEROQUEL) 200 MG tablet Take 1 tablet (200 mg total) by mouth at bedtime. Take a half tablet at night for 4 days then increase to a whole tablet each night. 90 tablet 2   tamsulosin (FLOMAX) 0.4 MG CAPS capsule Take 2 capsules (0.8 mg total) by mouth daily. 60 capsule 11   Vitamin D, Ergocalciferol, (DRISDOL) 1.25 MG (50000 UNIT) CAPS capsule TAKE 1 CAPSULE (50,000 UNITS TOTAL) BY MOUTH EVERY 7 (SEVEN) DAYS. 12 capsule 4   No current facility-administered medications for this visit.     Musculoskeletal: Strength & Muscle Tone: within normal limits Gait & Station: unsteady, using a walking stick Patient leans: Left  Psychiatric  Specialty Exam: Review of Systems  Respiratory:  Negative for cough and shortness of breath.   Cardiovascular:  Negative for chest pain.  Gastrointestinal:  Negative for abdominal pain, constipation, diarrhea, nausea and vomiting.  Neurological:  Positive for weakness (due to  MS). Negative for headaches.  Psychiatric/Behavioral:  Positive for dysphoric mood and sleep disturbance. Negative for agitation, hallucinations and suicidal ideas. The patient is nervous/anxious.     Blood pressure 127/78, pulse 60, height 5\' 11"  (1.803 m), weight 184 lb (83.5 kg), SpO2 100 %.Body mass index is 25.66 kg/m.  General Appearance: Casual  Eye Contact:  Fair  Speech:  Clear and Coherent and Slow  Volume:  Normal  Mood:   "ok"  Affect:  Appropriate and Congruent  Thought Process:  Coherent and Goal Directed  Orientation:  Full (Time, Place, and Person)  Thought Content: WDL and Logical   Suicidal Thoughts:  No  Homicidal Thoughts:  No  Memory:  Immediate;   Good Recent;   Fair  Judgement:  Good  Insight:  Good  Psychomotor Activity:  Normal  Concentration:  Concentration: Good and Attention Span: Good  Recall:  Good  Fund of Knowledge: Good  Language: Good  Akathisia:  Negative  Handed:  Right  AIMS (if indicated): not done  Assets:  Communication Skills Desire for Improvement Housing Resilience Social Support  ADL's:  Intact  Cognition: WNL  Sleep:  Poor   Screenings: GAD-7    Flowsheet Row Video Visit from 08/04/2021 in [facility] Video Visit from 06/14/2021 in [place] from 09/21/2020 in [facility] Office Visit from 08/11/2020 in [facility] Office Visit from 06/09/2020 in [facility]  Total GAD-7 Score 21 20 17 21 19       PHQ2-9    [place] Office Visit from 10/12/2021 in [place] Most  recent reading at 10/12/2021  8:29 AM Video Visit from 08/04/2021 in [facility] Most recent reading at 08/04/2021  1:55 PM Video Visit from 06/14/2021 in [facility] Most recent reading at 06/14/2021  1:15 PM ED from 04/13/2021 in [facility] Most recent reading at 04/13/2021 12:40 PM Office Visit from 04/13/2021 in [place] Most recent reading at 04/13/2021  8:17 AM  PHQ-2 Total Score 4 6 6 3 6   PHQ-9 Total Score 4 24 17 12 27       Flowsheet Row Video Visit from 08/04/2021 in [facility] Video Visit from 06/14/2021 in [facility] Office Visit from 08/11/2020 in [place]        Assessment and Plan:  Bradley Brandt is a 41 year old male who presents for follow-up and medication management.  PPHx is significant for Bipolar Disorder, Insomnia, and GAD.  PMH is significant for MS.  Oleh had his symptoms well controlled with his medications so we will restart his Seroquel.  Given his lack of an EKG we will not restart his Lexapro or Hydroxyzine at this time.  Once he is able to get an EKG from his PCP we will restart these.  He will return for follow up in approximately 3 months.   Bipolar Affective Disorder, remission status unspecified:  -Restart Seroquel 100 mg QHS for 4 days then increase to 200 mg QHS.  90 (200 mg) tablets with 2 refills.  -At this time will not restart Lexapro 10 mg daily for depression and anxiety.  No refills sent at this time.     GAD: -At this time will not restart Lexapro 10  mg daily for depression and anxiety.  No refills sent at this time. -At this time will not restart Hydroxyzine 50 mg TID PRN.  No refills sent at this time. -Restart Gabapentin 800 mg BID for anxiety and pain.  60 tablets with 1  refill.   Chronic Pain due to MS: -Restart Gabapentin 800 mg BID for anxiety and pain.  60 tablets with 1 refill. -Referral to Pain Management sent   Collaboration of Care: Collaboration of Care: Other Referral sent for Pain Management  Patient/Guardian was advised Release of Information must be obtained prior to any record release in order to collaborate their care with an outside provider. Patient/Guardian was advised if they have not already done so to contact the registration department to sign all necessary forms in order for [place] to release information regarding their care.   Consent: Patient/Guardian gives verbal consent for treatment and assignment of benefits for services provided during this visit. Patient/Guardian expressed understanding and agreed to proceed.    Briant Cedar, MD 04/27/2022, 7:40 AM

## 2022-04-27 DIAGNOSIS — G35 Multiple sclerosis: Secondary | ICD-10-CM | POA: Diagnosis not present

## 2022-04-28 DIAGNOSIS — G35 Multiple sclerosis: Secondary | ICD-10-CM | POA: Diagnosis not present

## 2022-04-29 DIAGNOSIS — G35 Multiple sclerosis: Secondary | ICD-10-CM | POA: Diagnosis not present

## 2022-04-30 DIAGNOSIS — G35 Multiple sclerosis: Secondary | ICD-10-CM | POA: Diagnosis not present

## 2022-05-01 DIAGNOSIS — G35 Multiple sclerosis: Secondary | ICD-10-CM | POA: Diagnosis not present

## 2022-05-02 DIAGNOSIS — G35 Multiple sclerosis: Secondary | ICD-10-CM | POA: Diagnosis not present

## 2022-05-03 DIAGNOSIS — G35 Multiple sclerosis: Secondary | ICD-10-CM | POA: Diagnosis not present

## 2022-05-04 DIAGNOSIS — G35 Multiple sclerosis: Secondary | ICD-10-CM | POA: Diagnosis not present

## 2022-05-05 DIAGNOSIS — G35 Multiple sclerosis: Secondary | ICD-10-CM | POA: Diagnosis not present

## 2022-05-06 DIAGNOSIS — G35 Multiple sclerosis: Secondary | ICD-10-CM | POA: Diagnosis not present

## 2022-05-07 DIAGNOSIS — G35 Multiple sclerosis: Secondary | ICD-10-CM | POA: Diagnosis not present

## 2022-05-08 DIAGNOSIS — G35 Multiple sclerosis: Secondary | ICD-10-CM | POA: Diagnosis not present

## 2022-05-09 DIAGNOSIS — G35 Multiple sclerosis: Secondary | ICD-10-CM | POA: Diagnosis not present

## 2022-05-10 DIAGNOSIS — G35 Multiple sclerosis: Secondary | ICD-10-CM | POA: Diagnosis not present

## 2022-05-12 DIAGNOSIS — G35 Multiple sclerosis: Secondary | ICD-10-CM | POA: Diagnosis not present

## 2022-05-13 DIAGNOSIS — G35 Multiple sclerosis: Secondary | ICD-10-CM | POA: Diagnosis not present

## 2022-05-14 DIAGNOSIS — G35 Multiple sclerosis: Secondary | ICD-10-CM | POA: Diagnosis not present

## 2022-05-15 DIAGNOSIS — G35 Multiple sclerosis: Secondary | ICD-10-CM | POA: Diagnosis not present

## 2022-05-16 DIAGNOSIS — G35 Multiple sclerosis: Secondary | ICD-10-CM | POA: Diagnosis not present

## 2022-05-17 ENCOUNTER — Encounter: Payer: Self-pay | Admitting: Neurology

## 2022-05-17 ENCOUNTER — Ambulatory Visit: Payer: Medicaid Other | Admitting: Neurology

## 2022-05-17 DIAGNOSIS — G35 Multiple sclerosis: Secondary | ICD-10-CM | POA: Diagnosis not present

## 2022-05-18 DIAGNOSIS — G35 Multiple sclerosis: Secondary | ICD-10-CM | POA: Diagnosis not present

## 2022-05-19 DIAGNOSIS — G35 Multiple sclerosis: Secondary | ICD-10-CM | POA: Diagnosis not present

## 2022-05-20 DIAGNOSIS — G35 Multiple sclerosis: Secondary | ICD-10-CM | POA: Diagnosis not present

## 2022-05-21 DIAGNOSIS — G35 Multiple sclerosis: Secondary | ICD-10-CM | POA: Diagnosis not present

## 2022-05-22 DIAGNOSIS — G35 Multiple sclerosis: Secondary | ICD-10-CM | POA: Diagnosis not present

## 2022-05-23 DIAGNOSIS — G35 Multiple sclerosis: Secondary | ICD-10-CM | POA: Diagnosis not present

## 2022-05-24 DIAGNOSIS — G35 Multiple sclerosis: Secondary | ICD-10-CM | POA: Diagnosis not present

## 2022-05-25 DIAGNOSIS — G35 Multiple sclerosis: Secondary | ICD-10-CM | POA: Diagnosis not present

## 2022-05-26 DIAGNOSIS — G35 Multiple sclerosis: Secondary | ICD-10-CM | POA: Diagnosis not present

## 2022-05-27 DIAGNOSIS — G35 Multiple sclerosis: Secondary | ICD-10-CM | POA: Diagnosis not present

## 2022-05-28 DIAGNOSIS — G35 Multiple sclerosis: Secondary | ICD-10-CM | POA: Diagnosis not present

## 2022-05-29 ENCOUNTER — Telehealth: Payer: Self-pay | Admitting: *Deleted

## 2022-05-29 DIAGNOSIS — G35 Multiple sclerosis: Secondary | ICD-10-CM | POA: Diagnosis not present

## 2022-05-29 NOTE — Telephone Encounter (Signed)
Kim/intrafusion got message from pt asking to schedule Tysabri infusion. He goes to [facility] for infusions.  I called pt and provided [facility] phone# [phone]. He will call and get this scheduled.    Reminded pt that next f/u 06/27/22 at 3:30pm with Dr. Epimenio Foot.

## 2022-05-30 DIAGNOSIS — G35 Multiple sclerosis: Secondary | ICD-10-CM | POA: Diagnosis not present

## 2022-05-31 DIAGNOSIS — G35 Multiple sclerosis: Secondary | ICD-10-CM | POA: Diagnosis not present

## 2022-06-01 DIAGNOSIS — G35 Multiple sclerosis: Secondary | ICD-10-CM | POA: Diagnosis not present

## 2022-06-02 DIAGNOSIS — G35 Multiple sclerosis: Secondary | ICD-10-CM | POA: Diagnosis not present

## 2022-06-03 DIAGNOSIS — G35 Multiple sclerosis: Secondary | ICD-10-CM | POA: Diagnosis not present

## 2022-06-04 DIAGNOSIS — G35 Multiple sclerosis: Secondary | ICD-10-CM | POA: Diagnosis not present

## 2022-06-05 DIAGNOSIS — G35 Multiple sclerosis: Secondary | ICD-10-CM | POA: Diagnosis not present

## 2022-06-06 DIAGNOSIS — G35 Multiple sclerosis: Secondary | ICD-10-CM | POA: Diagnosis not present

## 2022-06-07 DIAGNOSIS — G35 Multiple sclerosis: Secondary | ICD-10-CM | POA: Diagnosis not present

## 2022-06-08 DIAGNOSIS — G35 Multiple sclerosis: Secondary | ICD-10-CM | POA: Diagnosis not present

## 2022-06-09 DIAGNOSIS — G35 Multiple sclerosis: Secondary | ICD-10-CM | POA: Diagnosis not present

## 2022-06-10 DIAGNOSIS — G35 Multiple sclerosis: Secondary | ICD-10-CM | POA: Diagnosis not present

## 2022-06-11 DIAGNOSIS — G35 Multiple sclerosis: Secondary | ICD-10-CM | POA: Diagnosis not present

## 2022-06-12 DIAGNOSIS — G35 Multiple sclerosis: Secondary | ICD-10-CM | POA: Diagnosis not present

## 2022-06-13 DIAGNOSIS — G35 Multiple sclerosis: Secondary | ICD-10-CM | POA: Diagnosis not present

## 2022-06-14 DIAGNOSIS — G35 Multiple sclerosis: Secondary | ICD-10-CM | POA: Diagnosis not present

## 2022-06-15 DIAGNOSIS — G35 Multiple sclerosis: Secondary | ICD-10-CM | POA: Diagnosis not present

## 2022-06-16 DIAGNOSIS — G35 Multiple sclerosis: Secondary | ICD-10-CM | POA: Diagnosis not present

## 2022-06-17 DIAGNOSIS — G35 Multiple sclerosis: Secondary | ICD-10-CM | POA: Diagnosis not present

## 2022-06-18 DIAGNOSIS — G35 Multiple sclerosis: Secondary | ICD-10-CM | POA: Diagnosis not present

## 2022-06-19 DIAGNOSIS — G35 Multiple sclerosis: Secondary | ICD-10-CM | POA: Diagnosis not present

## 2022-06-20 DIAGNOSIS — G35 Multiple sclerosis: Secondary | ICD-10-CM | POA: Diagnosis not present

## 2022-06-21 DIAGNOSIS — G35 Multiple sclerosis: Secondary | ICD-10-CM | POA: Diagnosis not present

## 2022-06-22 ENCOUNTER — Ambulatory Visit: Payer: Medicaid Other | Admitting: Neurology

## 2022-06-22 DIAGNOSIS — G35 Multiple sclerosis: Secondary | ICD-10-CM | POA: Diagnosis not present

## 2022-06-23 DIAGNOSIS — G35 Multiple sclerosis: Secondary | ICD-10-CM | POA: Diagnosis not present

## 2022-06-24 DIAGNOSIS — G35 Multiple sclerosis: Secondary | ICD-10-CM | POA: Diagnosis not present

## 2022-06-25 DIAGNOSIS — G35 Multiple sclerosis: Secondary | ICD-10-CM | POA: Diagnosis not present

## 2022-06-26 DIAGNOSIS — G35 Multiple sclerosis: Secondary | ICD-10-CM | POA: Diagnosis not present

## 2022-06-27 ENCOUNTER — Encounter: Payer: Self-pay | Admitting: Neurology

## 2022-06-27 ENCOUNTER — Ambulatory Visit (INDEPENDENT_AMBULATORY_CARE_PROVIDER_SITE_OTHER): Payer: 59 | Admitting: Neurology

## 2022-06-27 VITALS — BP 146/112 | HR 71 | Ht 71.0 in | Wt 184.5 lb

## 2022-06-27 DIAGNOSIS — E559 Vitamin D deficiency, unspecified: Secondary | ICD-10-CM

## 2022-06-27 DIAGNOSIS — Z79899 Other long term (current) drug therapy: Secondary | ICD-10-CM | POA: Diagnosis not present

## 2022-06-27 DIAGNOSIS — F411 Generalized anxiety disorder: Secondary | ICD-10-CM

## 2022-06-27 DIAGNOSIS — G35 Multiple sclerosis: Secondary | ICD-10-CM

## 2022-06-27 MED ORDER — BACLOFEN 20 MG PO TABS: 20.0000 mg | ORAL_TABLET | Freq: Three times a day (TID) | ORAL | 11 refills | Status: DC

## 2022-06-27 MED ORDER — VITAMIN D (ERGOCALCIFEROL) 1.25 MG (50000 UNIT) PO CAPS: 50000.0000 [IU] | ORAL_CAPSULE | ORAL | 4 refills | Status: DC

## 2022-06-27 MED ORDER — GABAPENTIN 800 MG PO TABS: 800.0000 mg | ORAL_TABLET | Freq: Two times a day (BID) | ORAL | 11 refills | Status: DC

## 2022-06-27 NOTE — Progress Notes (Signed)
[name]  PATIENT: Bradley Brandt DOB: 21-Feb-1981  REFERRING DOCTOR OR PCP:  Aaron Edelman SOURCE: patient, records form Dr. George Hugh, MRI reports and images on PACS  _________________________________   HISTORICAL  CHIEF COMPLAINT:  Chief Complaint  Patient presents with   Follow-up    Room 11, MS, walking better, using cane to walk. Reports doing well so far.     HISTORY OF PRESENT ILLNESS:  Bradley Brandt is a 42 y.o.  man with relapsing remitting multiple sclerosis.     Update 1/9/202:   He is on Tysabri and gets infusions at [facility].   He tolerates it well.   Last JCV Ab has been negative.  We need to check another 1 today.  He feels he is stable for the most part.    Gait has done fairly well over the last 2 years.  He can walk 100 feet or more without it.   He has a left  foot drop and spastic gait.    This is better than last year.    He denies arm weakness or spasticity   No numbness or tingling   Bladder function is the same with some urgency as well as hesitancy (helped by tamsulosin)     He is on baclofen for spasticity and dalfampridine for the walking.     Vision is doing the same.   He feel she needs new glasses      He feels fatigued but this is stable.   He notes some depression, similar to earlier in the year.   He has more anxiety.   He is seeing psychiatry again.   He was diagnosed with bipolar disease   He is sleeping worse.[name]   He notes cognition is about the same.  Focus is sometimes poor.    He is asking about Suboxone.   He has a long h/o substance abuse and had been on it in the past.    He is currently not using but feels he would do better if on Subxone.        MS history: Bradley Brandt was diagnosed around 2001 when he presented with right-sided numbness and clumsiness and weakness.   Initially, he was on Avonex and then Rebif but had relapses on both of them.  He could not tolerate Tecfidera.   He was started on Tysabri about 1  year ago and his last infusion was at [facility] at 11/08/2015.  He was seen Dr. George Hugh [facility] and those records were reviewed. He did receive a course of Solu-Medrol March 2015 and another course of Solu-Medrol January 2016 when he presented with left leg weakness and numbness that was new    An MRI of the brain 08/28/2013 personally reviewed shows old extensive white matter changes in both hemispheres with multiple sclerosis. There were new areas of restricted diffusion without definite enhancement that could be consistent with ongoing demyelination when compared to the MRI from 05/13/2013.  Imaging: MRI of the brain 08/30/2019 showed multiple T2/FLAIR hyperintense foci in the hemispheres, pons, cerebellum and thalamus in a pattern and configuration consistent with chronic demyelinating plaque associated with multiple sclerosis.  None of the foci appear to be acute and they do not enhance.  Compared to the MRI dated 01/14/2019, there is no interval progression.  MRI of the cervical spine 01/06/2018 showed T2 hyperintense foci within the spinal cord posteriorly adjacent to C2, to the right adjacent to C3, to the  left adjacent to C4 and centrally and to the right adjacent to C4-C5 and C5.   None of these appear to be acute and they were all present on the 2015 MRI.[name]    MRI of the brain 01/06/2018 showed multiple T2/FLAIR hyperintense foci in the hemispheres and in the left middle cerebellar peduncle consistent with chronic demyelinating plaque associated with multiple sclerosis.  None of the foci appears to be acute.  None of them enhance.  However, when compared to the 2015 MRI focus in the cerebellum and a couple foci in the hemispheres were not clearly present on the earlier MRI.     REVIEW OF SYSTEMS: Constitutional: No fevers, chills, sweats, or change in appetite.   Notes fatigue Eyes: No visual changes, double vision, eye pain Ear, nose and throat: No hearing loss, ear  pain, nasal congestion, sore throat Cardiovascular: No chest pain, palpitations Respiratory:  No shortness of breath at rest or with exertion.   No wheezes GastrointestinaI: No nausea, vomiting, diarrhea, abdominal pain, fecal incontinence Genitourinary:  He has hesitancy and frequency.   No UTI's. Musculoskeletal:  No neck pain, back pain Integumentary: No rash, pruritus, skin lesions Neurological: as above Psychiatric: No depression at this time.  No anxiety Endocrine: No palpitations, diaphoresis, change in appetite, change in weigh or increased thirst Hematologic/Lymphatic:  No anemia, purpura, petechiae. Allergic/Immunologic: No itchy/runny eyes, nasal congestion, recent allergic reactions, rashes  ALLERGIES: Allergies  Allergen Reactions   Lithium Rash    HOME MEDICATIONS:  Current Outpatient Medications:    acetaminophen (TYLENOL) 500 MG tablet, Take 500 mg by mouth 2 (two) times daily., Disp: , Rfl:    dalfampridine 10 MG TB12, Take 1 tablet by mouth every 12 hours., Disp: 60 tablet, Rfl: 11   DULoxetine (CYMBALTA) 30 MG capsule, Take 30 mg by mouth every morning., Disp: , Rfl:    lidocaine (HM LIDOCAINE PATCH) 4 %, Place 1 patch onto the skin daily., Disp: 15 patch, Rfl: 0   natalizumab (TYSABRI) 300 MG/15ML injection, Inject 15 mLs (300 mg total) into the vein every 28 (twenty-eight) days., Disp: 15 mL, Rfl: 5   QUEtiapine (SEROQUEL) 200 MG tablet, Take 1 tablet (200 mg total) by mouth at bedtime. Take a half tablet at night for 4 days then increase to a whole tablet each night., Disp: 90 tablet, Rfl: 2   baclofen (LIORESAL) 20 MG tablet, Take 1 tablet (20 mg total) by mouth 3 (three) times daily., Disp: 90 tablet, Rfl: 11   gabapentin (NEURONTIN) 800 MG tablet, Take 1 tablet (800 mg total) by mouth 2 (two) times daily., Disp: 60 tablet, Rfl: 11   Vitamin D, Ergocalciferol, (DRISDOL) 1.25 MG (50000 UNIT) CAPS capsule, Take 1 capsule (50,000 Units total) by mouth every 7  (seven) days., Disp: 12 capsule, Rfl: 4  PAST MEDICAL HISTORY: Past Medical History:  Diagnosis Date   Bipolar 1 disorder (HCC)    Bipolar disorder, unspecified ([facility]) 01/03/2013   MS (multiple sclerosis) ([facility]) dx 2006   relapsing-remitting right sided weakness   Neuromuscular disorder ([facility])    MS    PAST SURGICAL HISTORY: Past Surgical History:  Procedure Laterality Date   TUMOR REMOVAL  1983   abdomen benign    FAMILY HISTORY: Family History  Adopted: Yes  Problem Relation Age of Onset   Lupus Mother    Ataxia Neg Hx    Chorea Neg Hx    Dementia Neg Hx    Mental retardation Neg Hx    Migraines  Neg Hx    Multiple sclerosis Neg Hx    Neurofibromatosis Neg Hx    Neuropathy Neg Hx    Parkinsonism Neg Hx    Seizures Neg Hx    Stroke Neg Hx     SOCIAL HISTORY:  Social History   Socioeconomic History   Marital status: Single    Spouse name: Not on file   Number of children: Not on file   Years of education: Not on file   Highest education level: Not on file  Occupational History   Not on file  Tobacco Use   Smoking status: Every Day    Packs/day: 1.00    Types: Cigarettes   Smokeless tobacco: Never  Vaping Use   Vaping Use: Former  Substance and Sexual Activity   Alcohol use: Not Currently    Alcohol/week: 72.0 standard drinks of alcohol    Types: 10 Glasses of wine, 12 Cans of beer, 50 Shots of liquor per week   Drug use: Not Currently    Frequency: 7.0 times per week    Types: Marijuana    Comment: daily   Sexual activity: Yes    Partners: Female    Birth control/protection: None  Other Topics Concern   Not on file  Social History Narrative   Not on file   Social Determinants of Health   Financial Resource Strain: Not on file  Food Insecurity: Not on file  Transportation Needs: Not on file  Physical Activity: Not on file  Stress: Not on file  Social Connections: Not on file  Intimate Partner Violence: Not on file     PHYSICAL  EXAM  Vitals:   06/27/22 1507  BP: (!) 146/112  Pulse: 71  Weight: 184 lb 8 oz (83.7 kg)  Height: 5\' 11"  (1.803 m)    Body mass index is 25.73 kg/m.   General: The patient is well-developed and well-nourished and in no acute distress.   Neurologic Exam   Mental status: The patient is alert and oriented x 3 at the time of the examination. The patient has apparent normal recent and remote memory, with an apparently normal attention span and concentration ability.   Speech is normal.   Cranial nerves: Extraocular movements are full.   2+ right APD.   Reduced visual acuity and reduced color vision OD     Facial strength is normal. Trapezius strength is strong.  No obvious hearing deficits are noted.   Motor:  Muscle bulk is normal.   Slowed rapid altering movements in left  hand.  There is increased muscle tone in the left arm and both legs  Strength is 5/5 in the right arm, 4+/5 in the left arm.  And 4- to 4/5 in the right leg and 4/5 in the left leg.      Sensory: He reports symmetric sensationin arms bu reduced left leg vibration sensation.  TOuch is symmetric.      Coordination: Cerebellar testing reveals mildly reduced finger-nose-finger and mildly ataxic heel-to-shin bilaterally, worse on left   Gait and station: Station is normal.   He has a spastic gait .  He has a left foot drop.  He can take steps without his cane but walks faster with it.    He cannot tandem walk.   Romberg is borderline   Reflexes: Deep tendon reflexes are increased with spread at the knees.  He has nonsustained clonus at the ankles       ____________________________________________  MS (multiple sclerosis) ([place]) -  Plan: Stratify JCV Antibody Test ([facility]), CBC with Differential/Platelet  High risk medication use - Plan: Stratify JCV Antibody Test ([facility]), CBC with Differential/Platelet  Vitamin D deficiency - Plan: VITAMIN D 25 Hydroxy (Vit-D Deficiency, Fractures), Vitamin D, Ergocalciferol, (DRISDOL)  1.25 MG (50000 UNIT) CAPS capsule  Generalized anxiety disorder - Plan: gabapentin (NEURONTIN) 800 MG tablet  1.   Continue Tysabri  .we will check the JCV antibody and blood count today.  Check vitamin D level..     2.   Continue gabapentin for dysesthesia and back pain. 3.  He has to be placed on Suboxone.  He had been on that in the past and he felt it had helped him stay off of street drugs.  I discussed that I have no experience with that medication.  I sent note to his psychiatrist to see if their office does this for substances of abuse or if they have a recommendation for referral. 4.   Stay active.   Exercise as tolerated 5.   Return in 6 months or sooner if new or worsening neurologic symptoms.    Micael Barb A. Felecia Shelling, MD, PhD [id number], [id number] PM Certified in Neurology, Clinical Neurophysiology, Sleep Medicine, Pain Medicine and Neuroimaging  [facility] [address], [place], [place] [address] [phone]

## 2022-06-28 DIAGNOSIS — G35 Multiple sclerosis: Secondary | ICD-10-CM | POA: Diagnosis not present

## 2022-06-28 LAB — VITAMIN D 25 HYDROXY (VIT D DEFICIENCY, FRACTURES): Vit D, 25-Hydroxy: 20.6 ng/mL — ABNORMAL LOW (ref 30.0–100.0)

## 2022-06-28 LAB — CBC WITH DIFFERENTIAL/PLATELET
Basophils Absolute: 0 10*3/uL (ref 0.0–0.2)
Basos: 0 %
EOS (ABSOLUTE): 0.1 10*3/uL (ref 0.0–0.4)
Eos: 2 %
Hematocrit: 50.4 % (ref 37.5–51.0)
Hemoglobin: 17.1 g/dL (ref 13.0–17.7)
Immature Grans (Abs): 0 10*3/uL (ref 0.0–0.1)
Immature Granulocytes: 0 %
Lymphocytes Absolute: 1.5 10*3/uL (ref 0.7–3.1)
Lymphs: 31 %
MCH: 31.5 pg (ref 26.6–33.0)
MCHC: 33.9 g/dL (ref 31.5–35.7)
MCV: 93 fL (ref 79–97)
Monocytes Absolute: 0.5 10*3/uL (ref 0.1–0.9)
Monocytes: 11 %
Neutrophils Absolute: 2.8 10*3/uL (ref 1.4–7.0)
Neutrophils: 56 %
Platelets: 183 10*3/uL (ref 150–450)
RBC: 5.43 x10E6/uL (ref 4.14–5.80)
RDW: 13.5 % (ref 11.6–15.4)
WBC: 5 10*3/uL (ref 3.4–10.8)

## 2022-06-29 ENCOUNTER — Other Ambulatory Visit: Payer: Self-pay | Admitting: *Deleted

## 2022-06-29 DIAGNOSIS — G35 Multiple sclerosis: Secondary | ICD-10-CM | POA: Diagnosis not present

## 2022-06-29 DIAGNOSIS — E559 Vitamin D deficiency, unspecified: Secondary | ICD-10-CM

## 2022-06-29 MED ORDER — VITAMIN D (ERGOCALCIFEROL) 1.25 MG (50000 UNIT) PO CAPS: 50000.0000 [IU] | ORAL_CAPSULE | ORAL | 3 refills | Status: DC

## 2022-06-30 DIAGNOSIS — G35 Multiple sclerosis: Secondary | ICD-10-CM | POA: Diagnosis not present

## 2022-07-01 DIAGNOSIS — G35 Multiple sclerosis: Secondary | ICD-10-CM | POA: Diagnosis not present

## 2022-07-02 DIAGNOSIS — G35 Multiple sclerosis: Secondary | ICD-10-CM | POA: Diagnosis not present

## 2022-07-03 ENCOUNTER — Telehealth: Payer: Self-pay | Admitting: *Deleted

## 2022-07-03 DIAGNOSIS — G35 Multiple sclerosis: Secondary | ICD-10-CM | POA: Diagnosis not present

## 2022-07-03 NOTE — Telephone Encounter (Signed)
LVM for pt to call office. Needs JCV ab still. Will need to come in for lab. This was missed accidentally last week.

## 2022-07-04 DIAGNOSIS — G35 Multiple sclerosis: Secondary | ICD-10-CM | POA: Diagnosis not present

## 2022-07-05 DIAGNOSIS — G35 Multiple sclerosis: Secondary | ICD-10-CM | POA: Diagnosis not present

## 2022-07-06 DIAGNOSIS — G35 Multiple sclerosis: Secondary | ICD-10-CM | POA: Diagnosis not present

## 2022-07-06 NOTE — Telephone Encounter (Addendum)
Tried calling pt at [phone]. LVM for pt to call.   Called [phone]. Received automated message that call cannot be completed.

## 2022-07-07 DIAGNOSIS — G35 Multiple sclerosis: Secondary | ICD-10-CM | POA: Diagnosis not present

## 2022-07-08 DIAGNOSIS — G35 Multiple sclerosis: Secondary | ICD-10-CM | POA: Diagnosis not present

## 2022-07-09 DIAGNOSIS — G35 Multiple sclerosis: Secondary | ICD-10-CM | POA: Diagnosis not present

## 2022-07-10 DIAGNOSIS — G35 Multiple sclerosis: Secondary | ICD-10-CM | POA: Diagnosis not present

## 2022-07-11 ENCOUNTER — Inpatient Hospital Stay (HOSPITAL_COMMUNITY)
Admission: EM | Admit: 2022-07-11 | Discharge: 2022-07-18 | DRG: 060 | Disposition: A | Discharge status: Home Health | Payer: 59 | Attending: Internal Medicine | Admitting: Internal Medicine

## 2022-07-11 ENCOUNTER — Emergency Department (HOSPITAL_COMMUNITY): Payer: 59

## 2022-07-11 ENCOUNTER — Encounter (HOSPITAL_COMMUNITY): Payer: Self-pay

## 2022-07-11 ENCOUNTER — Other Ambulatory Visit: Payer: Self-pay

## 2022-07-11 DIAGNOSIS — Z79899 Other long term (current) drug therapy: Secondary | ICD-10-CM

## 2022-07-11 DIAGNOSIS — E559 Vitamin D deficiency, unspecified: Secondary | ICD-10-CM | POA: Diagnosis present

## 2022-07-11 DIAGNOSIS — G35 Multiple sclerosis: Secondary | ICD-10-CM | POA: Diagnosis present

## 2022-07-11 DIAGNOSIS — R296 Repeated falls: Secondary | ICD-10-CM | POA: Diagnosis present

## 2022-07-11 DIAGNOSIS — Z832 Family history of diseases of the blood and blood-forming organs and certain disorders involving the immune mechanism: Secondary | ICD-10-CM

## 2022-07-11 DIAGNOSIS — D72828 Other elevated white blood cell count: Secondary | ICD-10-CM | POA: Diagnosis present

## 2022-07-11 DIAGNOSIS — F411 Generalized anxiety disorder: Secondary | ICD-10-CM | POA: Diagnosis present

## 2022-07-11 DIAGNOSIS — R3129 Other microscopic hematuria: Secondary | ICD-10-CM | POA: Diagnosis present

## 2022-07-11 DIAGNOSIS — R531 Weakness: Principal | ICD-10-CM

## 2022-07-11 DIAGNOSIS — F17210 Nicotine dependence, cigarettes, uncomplicated: Secondary | ICD-10-CM | POA: Diagnosis present

## 2022-07-11 DIAGNOSIS — G253 Myoclonus: Secondary | ICD-10-CM | POA: Diagnosis present

## 2022-07-11 DIAGNOSIS — F319 Bipolar disorder, unspecified: Secondary | ICD-10-CM | POA: Diagnosis present

## 2022-07-11 DIAGNOSIS — T380X5A Adverse effect of glucocorticoids and synthetic analogues, initial encounter: Secondary | ICD-10-CM | POA: Diagnosis present

## 2022-07-11 LAB — CBC WITH DIFFERENTIAL/PLATELET
Abs Immature Granulocytes: 0.02 10*3/uL (ref 0.00–0.07)
Basophils Absolute: 0 10*3/uL (ref 0.0–0.1)
Basophils Relative: 1 %
Eosinophils Absolute: 0.1 10*3/uL (ref 0.0–0.5)
Eosinophils Relative: 1 %
HCT: 45.3 % (ref 39.0–52.0)
Hemoglobin: 15.1 g/dL (ref 13.0–17.0)
Immature Granulocytes: 0 %
Lymphocytes Relative: 33 %
Lymphs Abs: 2.3 10*3/uL (ref 0.7–4.0)
MCH: 31.5 pg (ref 26.0–34.0)
MCHC: 33.3 g/dL (ref 30.0–36.0)
MCV: 94.6 fL (ref 80.0–100.0)
Monocytes Absolute: 0.5 10*3/uL (ref 0.1–1.0)
Monocytes Relative: 7 %
Neutro Abs: 4.1 10*3/uL (ref 1.7–7.7)
Neutrophils Relative %: 58 %
Platelets: 198 10*3/uL (ref 150–400)
RBC: 4.79 MIL/uL (ref 4.22–5.81)
RDW: 13.7 % (ref 11.5–15.5)
WBC: 7 10*3/uL (ref 4.0–10.5)
nRBC: 0 % (ref 0.0–0.2)

## 2022-07-11 LAB — COMPREHENSIVE METABOLIC PANEL
ALT: 26 U/L (ref 0–44)
AST: 39 U/L (ref 15–41)
Albumin: 4 g/dL (ref 3.5–5.0)
Alkaline Phosphatase: 72 U/L (ref 38–126)
Anion gap: 12 (ref 5–15)
BUN: 17 mg/dL (ref 6–20)
CO2: 21 mmol/L — ABNORMAL LOW (ref 22–32)
Calcium: 9 mg/dL (ref 8.9–10.3)
Chloride: 109 mmol/L (ref 98–111)
Creatinine, Ser: 1.24 mg/dL (ref 0.61–1.24)
GFR, Estimated: >60 mL/min (ref >60)
Glucose, Bld: 86 mg/dL (ref 70–99)
Potassium: 3.9 mmol/L (ref 3.5–5.1)
Sodium: 142 mmol/L (ref 135–145)
Total Bilirubin: 0.4 mg/dL (ref 0.3–1.2)
Total Protein: 6.8 g/dL (ref 6.5–8.1)

## 2022-07-11 LAB — URINALYSIS, ROUTINE W REFLEX MICROSCOPIC
Bilirubin Urine: NEGATIVE
Glucose, UA: NEGATIVE mg/dL
Ketones, ur: 20 mg/dL — AB
Leukocytes,Ua: NEGATIVE
Nitrite: NEGATIVE
Protein, ur: NEGATIVE mg/dL
Specific Gravity, Urine: 1.025 (ref 1.005–1.030)
pH: 5 (ref 5.0–8.0)

## 2022-07-11 LAB — ETHANOL: Alcohol, Ethyl (B): <10 mg/dL (ref <10)

## 2022-07-11 LAB — MAGNESIUM: Magnesium: 2 mg/dL (ref 1.7–2.4)

## 2022-07-11 MED ORDER — DROPERIDOL 2.5 MG/ML IJ SOLN
1.2500 mg | Freq: Once | INTRAMUSCULAR | Status: AC
  Administered 2022-07-11: 1.25 mg via INTRAVENOUS
  Filled 2022-07-11: qty 2

## 2022-07-11 MED ORDER — MIDAZOLAM HCL 2 MG/2ML IJ SOLN
2.0000 mg | Freq: Once | INTRAMUSCULAR | Status: AC
  Administered 2022-07-11: 2 mg via INTRAVENOUS
  Filled 2022-07-11: qty 2

## 2022-07-11 MED ORDER — LORAZEPAM 2 MG/ML IJ SOLN
1.0000 mg | Freq: Once | INTRAMUSCULAR | Status: AC
  Administered 2022-07-11: 1 mg via INTRAVENOUS
  Filled 2022-07-11: qty 1

## 2022-07-11 MED ORDER — SODIUM CHLORIDE 0.9 % IV BOLUS
1000.0000 mL | Freq: Once | INTRAVENOUS | Status: AC
  Administered 2022-07-11: 1000 mL via INTRAVENOUS

## 2022-07-11 MED ORDER — DIPHENHYDRAMINE HCL 50 MG/ML IJ SOLN
25.0000 mg | Freq: Once | INTRAMUSCULAR | Status: AC
  Administered 2022-07-11: 25 mg via INTRAVENOUS
  Filled 2022-07-11: qty 1

## 2022-07-11 MED ORDER — GADOBUTROL 1 MMOL/ML IV SOLN
8.0000 mL | Freq: Once | INTRAVENOUS | Status: AC | PRN
  Administered 2022-07-11: 8 mL via INTRAVENOUS

## 2022-07-11 NOTE — ED Provider Notes (Signed)
[facility] Provider Note   CSN: [id number] Arrival date & time: 07/11/22  1554     History  Chief Complaint  Patient presents with   Weakness   Fall    No thinners   Mobility problem    Bradley Brandt is a 42 y.o. male.  42 yo M with a chief complaints of frequent falls.  Occurring over the past few days.  He tells me he thinks it is an MS flare.  He has been admitted for something like this in the past.  He denies infectious symptoms denies cough congestion or fever denies nausea vomiting or diarrhea denies chest pain denies abdominal pain.  Has had some low back pain with this.  Feels like he has right sided weakness more than left.   Weakness Fall       Home Medications Prior to Admission medications   Medication Sig Start Date End Date Taking? Authorizing Provider  acetaminophen (TYLENOL) 500 MG tablet Take 500 mg by mouth 2 (two) times daily.    [provider]  baclofen (LIORESAL) 20 MG tablet Take 1 tablet (20 mg total) by mouth 3 (three) times daily. 06/27/22   Sater, Nanine Means, MD  dalfampridine 10 MG TB12 Take 1 tablet by mouth every 12 hours. 12/03/18   Sater, Nanine Means, MD  DULoxetine (CYMBALTA) 30 MG capsule Take 30 mg by mouth every morning. 04/26/21   [provider]  gabapentin (NEURONTIN) 800 MG tablet Take 1 tablet (800 mg total) by mouth 2 (two) times daily. 06/27/22   Sater, Nanine Means, MD  lidocaine (HM LIDOCAINE PATCH) 4 % Place 1 patch onto the skin daily. 10/12/21   Passmore, Jake Church I, NP  natalizumab (TYSABRI) 300 MG/15ML injection Inject 15 mLs (300 mg total) into the vein every 28 (twenty-eight) days. 09/05/16   Sater, Nanine Means, MD  QUEtiapine (SEROQUEL) 200 MG tablet Take 1 tablet (200 mg total) by mouth at bedtime. Take a half tablet at night for 4 days then increase to a whole tablet each night. 04/26/22   Briant Cedar, MD  Vitamin D, Ergocalciferol, (DRISDOL) 1.25 MG (50000 UNIT)  CAPS capsule Take 1 capsule (50,000 Units total) by mouth every 7 (seven) days. 06/29/22   Sater, Nanine Means, MD      Allergies    Lithium    Review of Systems   Review of Systems  Neurological:  Positive for weakness.    Physical Exam Updated Vital Signs BP (!) 125/58   Pulse (!) 44   Temp 98 F (36.7 C) (Oral)   Resp 16   SpO2 97%  Physical Exam Vitals and nursing note reviewed.  Constitutional:      Appearance: He is well-developed.  HENT:     Head: Normocephalic and atraumatic.  Eyes:     Pupils: Pupils are equal, round, and reactive to light.  Neck:     Vascular: No JVD.  Cardiovascular:     Rate and Rhythm: Normal rate and regular rhythm.     Heart sounds: No murmur heard.    No friction rub. No gallop.  Pulmonary:     Effort: No respiratory distress.     Breath sounds: No wheezing.  Abdominal:     General: There is no distension.     Tenderness: There is no abdominal tenderness. There is no guarding or rebound.  Musculoskeletal:        General: Normal range of motion.  Cervical back: Normal range of motion and neck supple.  Skin:    Coloration: Skin is not pale.     Findings: No rash.  Neurological:     Mental Status: He is alert and oriented to person, place, and time.     Comments: Approximately 4 beats of clonus of the right lower extremity.  Hyperreflexive in the right lower extremity compared to the left.  Weakness appreciated on bilateral lower extremities worse on the right than the left.  Both 4 and 5.  4-5 to bilateral upper extremities.  Psychiatric:        Behavior: Behavior normal.     ED Results / Procedures / Treatments   Labs (all labs ordered are listed, but only abnormal results are displayed) Labs Reviewed  COMPREHENSIVE METABOLIC PANEL - Abnormal; Notable for the following components:      Result Value   CO2 21 (*)    All other components within normal limits  URINALYSIS, ROUTINE W REFLEX MICROSCOPIC - Abnormal; Notable for the  following components:   APPearance HAZY (*)    Hgb urine dipstick SMALL (*)    Ketones, ur 20 (*)    Bacteria, UA RARE (*)    All other components within normal limits  CBC WITH DIFFERENTIAL/PLATELET  MAGNESIUM  ETHANOL    EKG EKG Interpretation  Date/Time:  Tuesday July 11 2022 22:30:46 EST Ventricular Rate:  43 PR Interval:  173 QRS Duration: 100 QT Interval:  491 QTC Calculation: 416 R Axis:   63 Text Interpretation: Sinus bradycardia Atrial premature complex Anteroseptal infarct, age indeterminate No significant change since last tracing Confirmed by Deno Etienne [phone]) on 07/11/2022 10:33:31 PM  Radiology DG Chest Port 1 View  Result Date: 07/11/2022 CLINICAL DATA:  Weakness EXAM: PORTABLE CHEST 1 VIEW COMPARISON:  01/02/2013 FINDINGS: The heart size and mediastinal contours are within normal limits. Both lungs are clear. The visualized skeletal structures are unremarkable. IMPRESSION: No active disease. Electronically Signed   By: Fidela Salisbury M.D.   On: 07/11/2022 23:41   MR CERVICAL SPINE WO CONTRAST  Result Date: 07/11/2022 CLINICAL DATA:  Initial evaluation for multiple sclerosis. EXAM: MRI CERVICAL SPINE WITHOUT CONTRAST TECHNIQUE: Multiplanar, multisequence MR imaging of the cervical spine was performed. No intravenous contrast was administered. COMPARISON:  Previous MRI from 06/04/2021. FINDINGS: Alignment: Examination moderately to severely degraded by motion artifact, limiting assessment. Straightening of the normal cervical lordosis.  No listhesis. Vertebrae: Vertebral body height maintained. Diffuse loss of normal bone marrow signal, nonspecific but can be seen with anemia, smoking, obesity, and infiltrative/myelofibrotic marrow processes. No focal marrow replacing lesion. No abnormal marrow edema. Cord: Evaluation of the cervical spinal cord limited by motion. Previously identified patchy cord signal abnormality extending from the cervicomedullary junction to  approximately C5-6, grossly similar as compared to previous exam. No obvious new lesions on this limited exam. No significant cord expansion or visible edema to suggest active demyelination. Underlying cord caliber remains within normal limits. Posterior Fossa, vertebral arteries, paraspinal tissues: Paraspinous soft tissues demonstrate no acute finding. Small sebaceous cyst noted within the subcutaneous fat of the left posterior neck. Normal flow voids seen within the vertebral arteries bilaterally. Disc levels: C2-C3: Mild uncovertebral spurring without significant disc bulge. No canal or foraminal stenosis. C3-C4: Disc bulge with bilateral uncovertebral spurring. No spinal stenosis. Moderate left C4 foraminal narrowing. Right neural foramen remains patent. C4-C5: Degenerative intervertebral disc space narrowing with diffuse disc osteophyte complex. No significant spinal stenosis. Mild left C5 foraminal narrowing. Right  neural foramina is patent. C5-C6: Degenerate intervertebral disc space narrowing with diffuse disc osteophyte complex. Flattening and partial effacement of the ventral thecal sac with resultant mild spinal stenosis. Mild right with moderate left C6 foraminal narrowing. Appearance is similar. C6-C7: Degenerative intervertebral disc space narrowing with diffuse disc osteophyte complex. No significant spinal stenosis. Mild to moderate left C7 foraminal narrowing. Right neural foramen veins patent. Appearance is similar. C7-T1:  Unremarkable. IMPRESSION: 1. Motion degraded exam. 2. Patchy cord signal abnormality extending from the cervicomedullary junction to approximately C5-6, consistent with history of multiple sclerosis. Overall, appearance is grossly similar to most recent MRI from 06/04/2021. No obvious new lesions or evidence for active demyelination. 3. Multilevel cervical spondylosis with resultant mild spinal stenosis at C5-6. Associated moderate left C4 and C6 foraminal stenosis, with mild  to moderate left C7 foraminal narrowing. Electronically Signed   By: Jeannine Boga M.D.   On: 07/11/2022 20:10   MR BRAIN WO CONTRAST  Result Date: 07/11/2022 CLINICAL DATA:  Initial evaluation for multiple sclerosis. EXAM: MRI HEAD WITHOUT CONTRAST TECHNIQUE: Multiplanar, multiecho pulse sequences of the brain and surrounding structures were obtained without intravenous contrast. COMPARISON:  Prior MRI from 06/04/2021. FINDINGS: Brain: Diffuse prominence of the CSF containing spaces, consistent with atrophy, advanced for age. Again seen are extensive patchy and confluent T2/FLAIR signal abnormality involving the periventricular, deep, and juxta cortical white matter both cerebral hemispheres. Patchy involvement of the descending white matter tracks, brainstem, and cerebellum. Overall, appearance is consistent with provided history of multiple sclerosis. These changes are progressed as compared to most recent MRI from 06/04/2021, with a few new and/or larger lesions (series 11, image 19 at the right frontal centrum semi ovale for example). Several of these lesions demonstrate prominent diffusion signal abnormality. While this could be related to T2 shine through, overall appearance is suspicious for possible active demyelination, although evaluation limited by lack of IV contrast. No evidence for acute or subacute infarct. Gray-white matter differentiation otherwise maintained. No other areas of chronic cortical infarction. No acute or chronic intracranial blood products. No mass lesion, midline shift or mass effect. No hydrocephalus or extra-axial fluid collection. Pituitary gland and suprasellar region within normal limits. Vascular: Major intracranial vascular flow voids are maintained. Skull and upper cervical spine: Cranial junctional limits. Bone marrow signal intensity normal. No scalp soft tissue abnormality. Sinuses/Orbits: Globes orbital soft tissues within normal limits. Paranasal sinuses are  largely clear. No mastoid effusion. Other: None. IMPRESSION: 1. Extensive T2/FLAIR signal abnormality involving the cerebral white matter as above, consistent with provided history of multiple sclerosis. Overall, these changes are progressed as compared to most recent MRI from 06/04/2021. Associated prominent diffusion signal abnormality about many of these lesions. While this could be related to T2 shine through, overall appearance is suspicious for possible active demyelination. Correlation with dedicated postcontrast imaging could be performed for confirmatory purposes as warranted. 2. Underlying advanced cerebral atrophy for age. Electronically Signed   By: Jeannine Boga M.D.   On: 07/11/2022 20:03    Procedures Procedures    Medications Ordered in ED Medications  sodium chloride 0.9 % bolus 1,000 mL (1,000 mLs Intravenous New Bag/Given 07/11/22 1948)  LORazepam (ATIVAN) injection 1 mg (1 mg Intravenous Given 07/11/22 1917)  midazolam (VERSED) injection 2 mg (2 mg Intravenous Given 07/11/22 2043)  droperidol (INAPSINE) 2.5 MG/ML injection 1.25 mg (1.25 mg Intravenous Given 07/11/22 2042)  diphenhydrAMINE (BENADRYL) injection 25 mg (25 mg Intravenous Given 07/11/22 2041)  gadobutrol (GADAVIST) 1 MMOL/ML injection 8  mL (8 mLs Intravenous Contrast Given 07/11/22 2141)    ED Course/ Medical Decision Making/ A&P                             Medical Decision Making Amount and/or Complexity of Data Reviewed Labs: ordered. Radiology: ordered.  Risk Prescription drug management.   42 yo M with a chief complaints of difficulty with ambulation.  Going on for the past 48 to 72 hours.  Has had frequent falls but fortunately has suffered no injury.  He is complaining of some mild low back pain post fall.  On my record review the patient is fairly debilitated from his MS.  He walks with a walker at baseline.  No reported clonus on his last exam but they did document some hyperreflexia.   Initial  plan to obtain MRI imaging to assess for acute MS flare.  Patient unfortunately was unable to lie still for the contrasted portion of the study.  I discussed the results with Dr. Wilford Corner, neurology he recommends retrying the contrasted studies with more sedation.  Sent back for MRI, discussed images with Dr. Wilford Corner, no contrast enhancing lesions per his read, no need for admission and steroids.  Recommends CXR to screen for pna.  If negative likely follow up with outpatient neuro.   Patient care was Dr. Bebe Shaggy, at that time the patient was still significantly sleepy post medications required for MRI.  Plan to with the patient and wake up and ambulate with a walker prior to discharge.  The patients results and plan were reviewed and discussed.   Any x-rays performed were independently reviewed by myself.   Differential diagnosis were considered with the presenting HPI.  Medications  sodium chloride 0.9 % bolus 1,000 mL (1,000 mLs Intravenous New Bag/Given 07/11/22 1948)  LORazepam (ATIVAN) injection 1 mg (1 mg Intravenous Given 07/11/22 1917)  midazolam (VERSED) injection 2 mg (2 mg Intravenous Given 07/11/22 2043)  droperidol (INAPSINE) 2.5 MG/ML injection 1.25 mg (1.25 mg Intravenous Given 07/11/22 2042)  diphenhydrAMINE (BENADRYL) injection 25 mg (25 mg Intravenous Given 07/11/22 2041)  gadobutrol (GADAVIST) 1 MMOL/ML injection 8 mL (8 mLs Intravenous Contrast Given 07/11/22 2141)    Vitals:   07/11/22 2221 07/11/22 2222 07/11/22 2225 07/11/22 2230  BP: 135/75   (!) 125/58  Pulse:  (!) 43 (!) 46 (!) 44  Resp:   14 16  Temp:      TempSrc:      SpO2: 100%  98% 97%    Final diagnoses:  Weakness            Final Clinical Impression(s) / ED Diagnoses Final diagnoses:  Weakness    Rx / DC Orders ED Discharge Orders     None         Melene Plan, DO 07/11/22 2344

## 2022-07-11 NOTE — ED Provider Notes (Signed)
I assumed care in signout to follow-up on chest x-ray and to reassess patient.  Chest x-ray is negative.  Patient still very somnolent from the medications given prior to MRI.  However he is arousable and resting comfortably.  He is having episodes of bradycardia while sleeping that would quickly recover. Will continue to monitor and reassess later in the evening   Ripley Fraise, MD 07/11/22 2358

## 2022-07-11 NOTE — ED Notes (Signed)
Pt back from MRI. Pt placed on full cardiac monitor. Pt noted to be bradycardic, MD Tyrone Nine notified. EKG obtained.

## 2022-07-11 NOTE — ED Notes (Signed)
Patient transported to MRI 

## 2022-07-11 NOTE — ED Triage Notes (Signed)
Arrives from home with weakness, difficultly ambulating for past 2 days, fall earlier in the day. No head strike, thinners, walks with walker, VSS HX of MS

## 2022-07-12 ENCOUNTER — Inpatient Hospital Stay (HOSPITAL_COMMUNITY): Payer: 59

## 2022-07-12 DIAGNOSIS — D72828 Other elevated white blood cell count: Secondary | ICD-10-CM | POA: Diagnosis present

## 2022-07-12 DIAGNOSIS — F319 Bipolar disorder, unspecified: Secondary | ICD-10-CM | POA: Diagnosis present

## 2022-07-12 DIAGNOSIS — R3129 Other microscopic hematuria: Secondary | ICD-10-CM | POA: Diagnosis present

## 2022-07-12 DIAGNOSIS — Z79899 Other long term (current) drug therapy: Secondary | ICD-10-CM | POA: Diagnosis not present

## 2022-07-12 DIAGNOSIS — R531 Weakness: Secondary | ICD-10-CM | POA: Diagnosis present

## 2022-07-12 DIAGNOSIS — R296 Repeated falls: Secondary | ICD-10-CM | POA: Diagnosis present

## 2022-07-12 DIAGNOSIS — G35 Multiple sclerosis: Secondary | ICD-10-CM

## 2022-07-12 DIAGNOSIS — F411 Generalized anxiety disorder: Secondary | ICD-10-CM | POA: Diagnosis present

## 2022-07-12 DIAGNOSIS — G253 Myoclonus: Secondary | ICD-10-CM | POA: Diagnosis present

## 2022-07-12 DIAGNOSIS — E559 Vitamin D deficiency, unspecified: Secondary | ICD-10-CM | POA: Diagnosis present

## 2022-07-12 DIAGNOSIS — Z832 Family history of diseases of the blood and blood-forming organs and certain disorders involving the immune mechanism: Secondary | ICD-10-CM | POA: Diagnosis not present

## 2022-07-12 DIAGNOSIS — F17210 Nicotine dependence, cigarettes, uncomplicated: Secondary | ICD-10-CM | POA: Diagnosis present

## 2022-07-12 DIAGNOSIS — T380X5A Adverse effect of glucocorticoids and synthetic analogues, initial encounter: Secondary | ICD-10-CM | POA: Diagnosis present

## 2022-07-12 LAB — HIV ANTIBODY (ROUTINE TESTING W REFLEX): HIV Screen 4th Generation wRfx: NONREACTIVE

## 2022-07-12 MED ORDER — SODIUM CHLORIDE 0.9 % IV SOLN
1000.0000 mg | INTRAVENOUS | Status: AC
  Administered 2022-07-13 – 2022-07-16 (×4): 1000 mg via INTRAVENOUS
  Filled 2022-07-12 (×4): qty 16

## 2022-07-12 MED ORDER — SODIUM CHLORIDE 0.9 % IV SOLN: 1000.0000 mg | INTRAVENOUS | Status: DC

## 2022-07-12 MED ORDER — SODIUM CHLORIDE 0.9 % IV SOLN
1000.0000 mg | Freq: Once | INTRAVENOUS | Status: AC
  Administered 2022-07-12: 1000 mg via INTRAVENOUS
  Filled 2022-07-12: qty 16

## 2022-07-12 MED ORDER — PANTOPRAZOLE SODIUM 40 MG PO TBEC: 40.0000 mg | DELAYED_RELEASE_TABLET | Freq: Every day | ORAL | Status: DC

## 2022-07-12 MED ORDER — QUETIAPINE FUMARATE 200 MG PO TABS
200.0000 mg | ORAL_TABLET | Freq: Every day | ORAL | Status: DC
  Administered 2022-07-12 – 2022-07-17 (×6): 200 mg via ORAL
  Filled 2022-07-12 (×6): qty 1

## 2022-07-12 MED ORDER — BACLOFEN 10 MG PO TABS
20.0000 mg | ORAL_TABLET | Freq: Three times a day (TID) | ORAL | Status: DC
  Administered 2022-07-12 – 2022-07-18 (×19): 20 mg via ORAL
  Filled 2022-07-12 (×19): qty 2

## 2022-07-12 MED ORDER — ENOXAPARIN SODIUM 40 MG/0.4ML IJ SOSY
40.0000 mg | PREFILLED_SYRINGE | INTRAMUSCULAR | Status: DC
  Administered 2022-07-12 – 2022-07-18 (×7): 40 mg via SUBCUTANEOUS
  Filled 2022-07-12 (×7): qty 0.4

## 2022-07-12 MED ORDER — GABAPENTIN 800 MG PO TABS
800.0000 mg | ORAL_TABLET | Freq: Two times a day (BID) | ORAL | Status: DC
  Filled 2022-07-12 (×2): qty 1

## 2022-07-12 MED ORDER — GABAPENTIN 400 MG PO CAPS
800.0000 mg | ORAL_CAPSULE | Freq: Two times a day (BID) | ORAL | Status: DC
  Administered 2022-07-12 – 2022-07-18 (×13): 800 mg via ORAL
  Filled 2022-07-12 (×13): qty 2

## 2022-07-12 NOTE — Progress Notes (Signed)
Inpatient Rehab Admissions Coordinator Note:   Per PT recommendations patient was screened for CIR candidacy by Michel Santee, PT. At this time, pt appears to be a potential candidate for CIR. I will place an order for rehab consult for full assessment, per our protocol.  Please contact me any with questions.Shann Medal, PT, DPT [phone] 07/12/22 9:54 AM

## 2022-07-12 NOTE — H&P (Addendum)
Date: 07/12/2022               Patient Name:  Bradley Brandt MRN: [id number]  DOB: 26-Aug-1980 Age / Sex: 42 y.o., male   PCP: Teena Dunk, NP         Medical Service: Internal Medicine Teaching Service         Attending Physician: Dr. Campbell Riches, MD      First Contact: Dr. Iona Coach, MD Pager [phone]    Second Contact: Dr. Farrel Gordon, DO Pager [phone]         After Hours (After 5p/  First Contact Pager: [phone]  weekends / holidays): Second Contact Pager: [phone]   SUBJECTIVE   Chief Complaint: Leg weakness and difficulty seeing  History of Present Illness: This is a 42 year old male with a past medical history of multiple sclerosis, bipolar 1 disorder who presents for a 3-day history of leg weakness and vision changes.  He notes that he was in his room, and gradually started having leg weakness, and was unable to walk.  He states he was unable to feel his legs.  Denies any falls or any head impact.  He also reports associated vision changes.  History is limited, given patient was medicated for MRI, and therefore is not able to stay awake on my exam.  Patient denied any other symptoms except vision changes and losing feeling in his legs.  Per chart review, patient has relapsing remitting multiple sclerosis and is on Dalfampridine and has had Tysabri infusions.  Patient also takes baclofen daily.  Patient's most recent visit to neurology was on January 9th, 2024 with Dr. Felecia Shelling in which patient is to continue Tysabri and plan was to check vitamin D level and JCV antibody as well as blood counts.  Patient's last infusion was on 04/21/2022.  Patient to continue to be on Gabapentin. Physician did mention patient could potentially benefit from being on Suboxone.  Seems as if patient's last flareup was in January 2016.  Per most recent psychiatry note, patient has a past medical history of GAD, insomnia, and bipolar 1 disorder.  Patient remains on Seroquel, patient did not  restart Lexapro.  Patient was not restarted back on hydroxyzine.  Patient was restarted back on gabapentin.  Referred to pain management.  MS History per neurology: Bradley Brandt was diagnosed around 2001 when he presented with right-sided numbness and clumsiness and weakness.   Initially, he was on Avonex and then Rebif but had relapses on both of them.  He could not tolerate Tecfidera.   He was started on Tysabri about 1 year ago and his last infusion was at [facility] at 11/08/2015.  He was seen Dr. George Hugh [facility] Brandt and those records were reviewed. He did receive a course of Solu-Medrol March 2015 and another course of Solu-Medrol January 2016 when he presented with left leg weakness and numbness that was new    An MRI of the brain 08/28/2013 personally reviewed shows old extensive white matter changes in both hemispheres with multiple sclerosis. There were new areas of restricted diffusion without definite enhancement that could be consistent with ongoing demyelination when compared to the MRI from 05/13/2013.   ED Course: Patient initially presented to the emergency room with vital signs showing temperature 98.1 F, pulse 59, BP 148/76, satting at 96% on room air.  Patient had concerns about numbness to his bilateral lower extremities.  Initial labs were for the most part unremarkable.  Patient did  have rare bacteria on his UA.  Imaging was consistent with active demyelination at the level of T7 as well as in the brain. Patient also had brain imaging that was can distant with active demyelination.  Neurology was consulted, who started high-dose steroids.  IMTS consulted for admission.  Past Medical History Relapsing remitting multiple sclerosis Bipolar 1 disorder GAD Insomnia  Meds:  Acetaminophen 500 mg twice daily Dalfampridine 10 mg every 12 hours Duloxetine 30 mg daily Lidocaine patch Tysabri infusions every 28 days at [place], last infusion 04/21/22 Seroquel 200 mg  at bedtime Baclofen 20 mg 3 times daily Gabapentin 800 mg twice daily Vitamin D 50,000 units weekly Will need to address once less sedated, as these are medications that were charted   Past Surgical History  Past Surgical History:  Procedure Laterality Date   TUMOR REMOVAL  1983   abdomen benign    Social:  Lives With: N/A Occupation: N/A Support: N/A Level of Function: N/A PCP: Orion Crook, NP  Substances: N/A  Family History: N/A  Allergies: Allergies as of 07/11/2022 - Review Complete 07/11/2022  Allergen Reaction Noted   Lithium Rash 01/02/2013    Review of Systems: A complete ROS was negative except as per HPI.   OBJECTIVE:   Physical Exam: Blood pressure (!) 125/58, pulse (!) 44, temperature 98 F (36.7 C), temperature source Oral, resp. rate 16, SpO2 97 %.  Constitutional: Resting in bed, no acute distress, very somnolent HENT: normocephalic atraumatic Cardiovascular: regular rate and rhythm, no m/r/g Pulmonary/Chest: normal work of breathing on room air, lungs clear to auscultation bilaterally Abdominal: soft, non-tender, non-distended MSK: normal bulk and tone Neurological: Very somnolent, delayed responses  Labs: CBC    Component Value Date/Time   WBC 7.0 07/11/2022 1705   RBC 4.79 07/11/2022 1705   HGB 15.1 07/11/2022 1705   HGB 17.1 06/27/2022 1554   HCT 45.3 07/11/2022 1705   HCT 50.4 06/27/2022 1554   PLT 198 07/11/2022 1705   PLT 183 06/27/2022 1554   MCV 94.6 07/11/2022 1705   MCV 93 06/27/2022 1554   MCH 31.5 07/11/2022 1705   MCHC 33.3 07/11/2022 1705   RDW 13.7 07/11/2022 1705   RDW 13.5 06/27/2022 1554   LYMPHSABS 2.3 07/11/2022 1705   LYMPHSABS 1.5 06/27/2022 1554   MONOABS 0.5 07/11/2022 1705   EOSABS 0.1 07/11/2022 1705   EOSABS 0.1 06/27/2022 1554   BASOSABS 0.0 07/11/2022 1705   BASOSABS 0.0 06/27/2022 1554     CMP     Component Value Date/Time   NA 142 07/11/2022 1705   NA 143 12/12/2021 1426   K 3.9 07/11/2022  1705   CL 109 07/11/2022 1705   CO2 21 (L) 07/11/2022 1705   GLUCOSE 86 07/11/2022 1705   BUN 17 07/11/2022 1705   BUN 24 12/12/2021 1426   CREATININE 1.24 07/11/2022 1705   CALCIUM 9.0 07/11/2022 1705   PROT 6.8 07/11/2022 1705   PROT 7.4 12/12/2021 1426   ALBUMIN 4.0 07/11/2022 1705   ALBUMIN 4.8 12/12/2021 1426   AST 39 07/11/2022 1705   ALT 26 07/11/2022 1705   ALKPHOS 72 07/11/2022 1705   BILITOT 0.4 07/11/2022 1705   BILITOT 0.3 12/12/2021 1426   GFRNONAA >60 07/11/2022 1705   GFRAA 76 04/08/2020 1044    Imaging:  Chest x-ray: No acute cardiopulmonary process  MRI brain without contrast: Consistent with multiple sclerosis, suspicious for possible active demyelination.  MRI brain with contrast: Findings consistent with multiple sclerosis, with several  new lesions demonstrating associated enhancement, compatible with active demyelination.  MRI cervical spine without contrast: There is patchy cord signal abnormality extending from the cervical medullary junction to approximately C5-6, consistent with history of multiple sclerosis.  No obvious evidence of active demyelination.  MRI thoracic spine with without contrast: Patchy cord signal abnormality at the level of T5 and T7-8 as above, consistent with demyelinating disease.  The lesion at T7 consistent with active demyelination.  EKG: personally reviewed my interpretation is sinus bradycardia with a rate of 48.  Normal axis.  No acute ST segment changes noted.  Not much different from previous ECG.  ASSESSMENT & PLAN:   Assessment & Plan by Problem: Principal Problem:   Multiple sclerosis exacerbation ([place])   TIMARION AGCAOILI is a 42 y.o. person living with a history of relapsing remitting MS, bipolar disorder, GAD, insomnia presenting with concerns of leg weakness and vision changes.  Patient admitted for further evaluation and management of MS flareup.  #Flareup of relapsing remitting MS Patient with 3-day history of  leg weakness, and acute vision changes.  Patient has been unable to walk and felt numbness in his legs.  Patient usually takes to Tysabri infusions as well as Dalfampridine daily for MS.  Patient's last infusion was in November per charting.  Patient is post to get infusions of Tysabri monthly.  Unclear on why patient has not been getting his infusions.  Patient has seen neurology recently, who was working him up for JCV and getting vitamin D levels.  Patient does not seem to have acute infection and other labs are stable for most part.  On my exam, patient is very somnolent given he was recently medicated for MRI.  Unable to perform full exam, but EDP stating patient had some myoclonus noted to right upper extremity.  MRI showing active demyelination in the brain as well as at the level of T7.  Neurology consulted starting high-dose steroids for MS flareup.  Criteria for MS flareup include focal neurological disturbance lasting more than 24 hours without an alternative explanation, preceding period of clinical stability lasting at least 30 days. From history and imaging, consistent with MS flareup, but on my exam patient was very somnolent, and was not able to do a full neurological exam to appreciate for any focal neurological deficits.  Need to evaluate for any alternative explanation as patient's mentation improves.  No recent infection appreciated on history.  Need to evaluate further to see why patient did not get his infusions for the past 2 months which could be the reason that the patient has a flare.  Before this flareup, patient's last flareup seem to be in 2016.  Please see HPI for MS history.  Patient is compliant with his medications, and goes to his neurologist regularly. -Neurology following -1 g Solu-Medrol daily for the next 5 days -Neurochecks frequent -Will need to evaluate for why patient has been missing infusion appointments -Hold home Dalfampridine 10 mg every 12 hours -Hold home  baclofen 20 mg 3 times daily -Hold home vitamin D weekly -Hold home gabapentin 800 mg daily. -Follow up JCV antibody testing   #Hemoglobinuria UA positive for hemoglobin, with 21-50 red blood cells per high-power field.  Was not able to get history from patient given mental state.  Would like to ask patient about dysuria, or any signs of infection that could be contributing.  Can ask patient if he noticed any blood in his urine. Per neuro note there was concern that patient  was having back pain, could correlate with nephrolithiasis. Will need more history form patient as he wakes up.  -Follow-up once patient becomes more awake  #Bipolar 1 disorder Patient has a past medical history of bipolar 1 disorder.  Patient currently on Seroquel 200 mg at bedtime.  No acute concerns at this time. -Given somnolence, holding home Seroquel  #GAD Patient has a past medical history of GAD.  Patient initially was not restarted on Lexapro or hydroxyzine.  Patient was restarted back on gabapentin 800 mg twice daily at last psychiatry appointment. -Hold home gabapentin given somnolence  #Substance use disorder Patient has a past medical history of substance use disorder.  Unclear what substances patient uses given patient has somnolence and I cannot confirm.  Per charting, patient uses "street drugs" and per neurologist, patient could benefit from Suboxone.  Will need to discuss this with patient once patient is more awake. -Monitor COWS score -Might need to establish for Suboxone treatment  #Vitamin D Deficiency  Patient with past medical history of vitamin D deficiency.  Patient's most recent vitamin D level 20.6 2 weeks ago.  Will need vitamin D supplements during hospitalization.  Patient is on home supplementation of 50,000 units weekly. -Given mental state, hold supplementation.  Diet: NPO VTE: Enoxaparin IVF: None,None Code: Full Will need to address once less sedated  Dispo: Admit patient to  inpatient service for continued IV steroid therapy for his suspected MS flair.   Signed: Modena Slater, DO Internal Medicine Resident PGY-1 Pager: [phone] 07/12/2022, 4:16 AM

## 2022-07-12 NOTE — ED Provider Notes (Signed)
MRI results reveal potential MS flare.  Discussed with neurology.  Will give Solu-Medrol 1 g and admit to the medical service   Ripley Fraise, MD 07/12/22 0005

## 2022-07-12 NOTE — TOC Initial Note (Signed)
Transition of Care [facility]) - Initial/Assessment Note    Patient Details  Name: Bradley Brandt MRN: [id number] Date of Birth: 1980-10-17  Transition of Care [facility]) CM/SW Contact:    Coralee Pesa, [place] Phone Number: 07/12/2022, 9:49 AM  Clinical Narrative:                  Transition of Care [facility]) Screening Note   Patient Details  Name: Bradley Brandt Date of Birth: 10/18/1980   Transition of Care [facility]) CM/SW Contact:    Coralee Pesa, [place] Phone Number: 07/12/2022, 9:50 AM    Transition of Care Department [facility]) has reviewed patient and no TOC needs have been identified at this time. We will continue to monitor patient advancement through interdisciplinary progression rounds. Pt noted to have CIR recommendations at this time. If new patient transition needs arise, please place a TOC consult.    Expected Discharge Plan: IP Rehab Facility Barriers to Discharge: Continued Medical Work up, Ship broker, Barriers Unresolved (comment) (CIR reccomendation)   Patient Goals and CMS Choice            Expected Discharge Plan and Services       Living arrangements for the past 2 months: Single Family Home                                      Prior Living Arrangements/Services Living arrangements for the past 2 months: Single Family Home Lives with:: Self Patient language and need for interpreter reviewed:: Yes        Need for Family Participation in Patient Care: Yes (Comment) Care giver support system in place?: Yes (comment)   Criminal Activity/Legal Involvement Pertinent to Current Situation/Hospitalization: No - Comment as needed  Activities of Daily Living Home Assistive Devices/Equipment: Walker (specify type), Cane (specify quad or straight) ADL Screening (condition at time of admission) Patient's cognitive ability adequate to safely complete daily activities?: Yes Is the patient deaf or have difficulty hearing?: No Does the patient have  difficulty seeing, even when wearing glasses/contacts?: No Does the patient have difficulty concentrating, remembering, or making decisions?: No Patient able to express need for assistance with ADLs?: Yes Does the patient have difficulty dressing or bathing?: No Independently performs ADLs?: Yes (appropriate for developmental age) Does the patient have difficulty walking or climbing stairs?: Yes Weakness of Legs: Both Weakness of Arms/Hands: None  Permission Sought/Granted                  Emotional Assessment       Orientation: : Oriented to Self, Oriented to Place, Oriented to  Time, Oriented to Situation Alcohol / Substance Use: Not Applicable Psych Involvement: No (comment)  Admission diagnosis:  Weakness [R53.1] Multiple sclerosis exacerbation ([place]) [G35] Patient Active Problem List   Diagnosis Date Noted   Multiple sclerosis exacerbation ([place]) 07/12/2022   Marijuana use 11/22/2020   History of marijuana use 11/22/2020   Chronic pain syndrome 11/21/2020   Pharmacologic therapy 11/21/2020   Disorder of skeletal system 11/21/2020   Problems influencing health status 11/21/2020   MS (multiple sclerosis) ([place])    Neuromuscular disorder ([place])    Bipolar disorder in full remission ([place]) 11/16/2020   Other insomnia 08/11/2020   Generalized anxiety disorder 08/11/2020   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[place]) 12/31/2017   Optic neuritis 08/02/2017   High risk medication use 08/02/2017   Vitamin D deficiency 09/19/2016  Hypercholesterolemia 09/19/2016   Numbness 12/16/2015   Spastic gait 12/16/2015   Urinary hesitancy 12/16/2015   Low back pain with sciatica 09/28/2014   Polysubstance dependence ([place]) 02/09/2014   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 [place] [address] Phone: [phone] Fax: [phone]  [id number] [place], [place] [address] [place] [address] Phone: [phone] Fax: [phone]     Social Determinants of Health (SDOH) Social History: [place]: No Food Insecurity (07/12/2022)  Housing: Low Risk  (07/12/2022)  Transportation Needs: No Transportation Needs (07/12/2022)  Utilities: Not At Risk (07/12/2022)  Depression (PHQ2-9): Low Risk  (10/12/2021)  Recent Concern: Depression (PHQ2-9) - Medium Risk (08/04/2021)  Tobacco Use: High Risk (07/11/2022)   SDOH Interventions:     Readmission Risk Interventions     No data to display

## 2022-07-12 NOTE — Progress Notes (Signed)
Neurology Progress Note  Brief HPI: Patient with a history of MS on Tysabri and bipolar disorder presents with multiple falls and weakness worse on his left than on his right.  Patient states that he normally is able to walk with a walker but has not been able to ambulate well for the last 2 weeks.  He has had multiple falls.  MRI brain performed last night demonstrated enhancing lesions in the brain and thoracic spine consistent with MS exacerbation.  Upon admission, he received multiple sedating medications in order to obtain MRI and was quite drowsy, impairing exam.  However, this morning he is alert and oriented and appears to be at his baseline.  Subjective: Patient is alert and reports that he is feeling well today, states he is hoping that steroids will improve his symptoms.  Exam: Vitals:   07/12/22 0354 07/12/22 0459  BP: (!) 92/48 (!) 112/53  Pulse: (!) 57 (!) 48  Resp: 18 20  Temp: 99 F (37.2 C) 97.9 F (36.6 C)  SpO2: 97% 97%   Gen: In bed, NAD Resp: non-labored breathing, no acute distress  Neuro: Mental Status: Alert and oriented to person place time and situation Cranial Nerves: Pupils equal round and reactive, extraocular movements intact, some facial numbness to right V1 and V2, face symmetrical, hearing intact to voice, phonation normal, shoulder shrug symmetrical, tongue midline Motor: 5 out of 5 strength to upper right upper extremity proximal and distal, 4+ out of 5 strength to left upper extremity proximal and distal, 4 out of 5 to bilateral lower extremities proximal and distal Sensory: Intact to light touch throughout Gait: Deferred  Pertinent Labs:    Latest Ref Rng & Units 07/11/2022    5:05 PM 06/27/2022    3:54 PM 12/12/2021    2:26 PM  CBC  WBC 4.0 - 10.5 K/uL 7.0  5.0  9.5   Hemoglobin 13.0 - 17.0 g/dL 15.1  17.1  15.7   Hematocrit 39.0 - 52.0 % 45.3  50.4  45.4   Platelets 150 - 400 K/uL 198  183  189        Latest Ref Rng & Units 07/11/2022     5:05 PM 12/12/2021    2:26 PM 04/13/2021    9:42 AM  BMP  Glucose 70 - 99 mg/dL 86  87  90   BUN 6 - 20 mg/dL 17  24  19    Creatinine 0.61 - 1.24 mg/dL 1.24  1.23  1.18   BUN/Creat Ratio 9 - 20  20  16    Sodium 135 - 145 mmol/L 142  143  141   Potassium 3.5 - 5.1 mmol/L 3.9  4.4  4.8   Chloride 98 - 111 mmol/L 109  105  101   CO2 22 - 32 mmol/L 21  23    Calcium 8.9 - 10.3 mg/dL 9.0  10.5  9.9      Imaging Reviewed:  MRI brain: Several new lesions consistent with active demyelination  MRI cervical and thoracic spine: Patchy cord signal abnormality extending from cervical medullary junction to C5-C6 consistent with history of MS. Cord signal abnormality at T5 and T7-8 consistent with demyelinating disease with active enhancement to lesion at T7  Assessment: 42 year old male with history of relapsing remitting MS and bipolar disorder presents with a few weeks history of impaired ambulation, multiple falls and weakness worse on the left side than right side.  MRI brain and cervical/thoracic spine reveal active enhancing lesions consistent with MS  exacerbation.  Impression: Exacerbation of relapsing remitting multiple sclerosis  Recommendations: 1) continue Solu-Medrol 1 g daily for 5 days 2) PT/OT 3) neurology will continue to follow  [place] , MSN, AGACNP-BC [facility] See Amion for schedule and pager information 07/12/2022 7:35 AM   Electronically signed: Dr. Kerney Elbe

## 2022-07-12 NOTE — ED Notes (Signed)
ED TO INPATIENT HANDOFF REPORT  ED Nurse Name and Phone #: Colletta [place] [phone]  S Name/Age/Gender Bradley Brandt 42 y.o. male Room/Bed: 003C/003C  Code Status   Code Status: Full Code  Home/SNF/Other Home Patient oriented to: self, place, time, and situation Is this baseline? Yes   Triage Complete: Triage complete  Chief Complaint Multiple sclerosis exacerbation ([place]) [G35]  Triage Note Arrives from home with weakness, difficultly ambulating for past 2 days, fall earlier in the day. No head strike, thinners, walks with walker, VSS HX of MS   Allergies Allergies  Allergen Reactions   Lithium Rash    Level of Care/Admitting Diagnosis ED Disposition     ED Disposition  Admit   Condition  --   [place]: [place] [[id number]]  Level of Care: Med-Surg [16]  May admit patient to [facility] or [facility] if equivalent level of care is available:: No  Covid Evaluation: Asymptomatic - no recent exposure (last 10 days) testing not required  Diagnosis: Multiple sclerosis exacerbation [facility]) [[id number]]  Admitting Physician: Campbell Riches [[id number]]  Attending Physician: Johnnye Sima, JEFFREY C [[id number]]  Certification:: I certify this patient will need inpatient services for at least 2 midnights  Estimated Length of Stay: 5          B Medical/Surgery History Past Medical History:  Diagnosis Date   Bipolar 1 disorder ([place])    Bipolar disorder, unspecified ([place]) 01/03/2013   MS (multiple sclerosis) ([place]) dx 2006   relapsing-remitting right sided weakness   Neuromuscular disorder ([place])    MS   Past Surgical History:  Procedure Laterality Date   TUMOR REMOVAL  1983   abdomen benign     A IV Location/Drains/Wounds Patient Lines/Drains/Airways Status     Active Line/Drains/Airways     Name Placement date Placement time Site Days   Peripheral IV 07/11/22 20 G 1" Right Antecubital 07/11/22  1710  Antecubital  1            Intake/Output  Last 24 hours No intake or output data in the 24 hours ending 07/12/22 0046  Labs/Imaging Results for orders placed or performed during the hospital encounter of 07/11/22 (from the past 48 hour(s))  Ethanol     Status: None   Collection Time: 07/11/22  4:51 PM  Result Value Ref Range   Alcohol, Ethyl (B) <10 <10 mg/dL    Comment: (NOTE) Lowest detectable limit for serum alcohol is 10 mg/dL.  For medical purposes only. Performed at [facility], [place] [address], [place] [address]   CBC with Differential     Status: None   Collection Time: 07/11/22  5:05 PM  Result Value Ref Range   WBC 7.0 4.0 - 10.5 K/uL   RBC 4.79 4.22 - 5.81 MIL/uL   Hemoglobin 15.1 13.0 - 17.0 g/dL   HCT 45.3 39.0 - 52.0 %   MCV 94.6 80.0 - 100.0 fL   MCH 31.5 26.0 - 34.0 pg   MCHC 33.3 30.0 - 36.0 g/dL   RDW 13.7 11.5 - 15.5 %   Platelets 198 150 - 400 K/uL   nRBC 0.0 0.0 - 0.2 %   Neutrophils Relative % 58 %   Neutro Abs 4.1 1.7 - 7.7 K/uL   Lymphocytes Relative 33 %   Lymphs Abs 2.3 0.7 - 4.0 K/uL   Monocytes Relative 7 %   Monocytes Absolute 0.5 0.1 - 1.0 K/uL   Eosinophils Relative 1 %  Eosinophils Absolute 0.1 0.0 - 0.5 K/uL   Basophils Relative 1 %   Basophils Absolute 0.0 0.0 - 0.1 K/uL   Immature Granulocytes 0 %   Abs Immature Granulocytes 0.02 0.00 - 0.07 K/uL    Comment: Performed at [facility], [address], [place], [place] [address]  Comprehensive metabolic panel     Status: Abnormal   Collection Time: 07/11/22  5:05 PM  Result Value Ref Range   Sodium 142 135 - 145 mmol/L   Potassium 3.9 3.5 - 5.1 mmol/L   Chloride 109 98 - 111 mmol/L   CO2 21 (L) 22 - 32 mmol/L   Glucose, Bld 86 70 - 99 mg/dL    Comment: Glucose reference range applies only to samples taken after fasting for at least 8 hours.   BUN 17 6 - 20 mg/dL   Creatinine, Ser 8.65 0.61 - 1.24 mg/dL   Calcium 9.0 8.9 - 78.4 mg/dL   Total Protein 6.8 6.5 - 8.1 g/dL   Albumin 4.0 3.5 - 5.0 g/dL    AST 39 15 - 41 U/L   ALT 26 0 - 44 U/L   Alkaline Phosphatase 72 38 - 126 U/L   Total Bilirubin 0.4 0.3 - 1.2 mg/dL   GFR, Estimated >69 >62 mL/min    Comment: (NOTE) Calculated using the CKD-EPI Creatinine Equation (2021)    Anion gap 12 5 - 15    Comment: Performed at [facility], [address] [place], [place] [address]  Magnesium     Status: None   Collection Time: 07/11/22  5:05 PM  Result Value Ref Range   Magnesium 2.0 1.7 - 2.4 mg/dL    Comment: Performed at [facility], [address], [place], [place] [address]  Urinalysis, Routine w reflex microscopic     Status: Abnormal   Collection Time: 07/11/22  5:53 PM  Result Value Ref Range   Color, Urine YELLOW YELLOW   APPearance HAZY (A) CLEAR   Specific Gravity, Urine 1.025 1.005 - 1.030   pH 5.0 5.0 - 8.0   Glucose, UA NEGATIVE NEGATIVE mg/dL   Hgb urine dipstick SMALL (A) NEGATIVE   Bilirubin Urine NEGATIVE NEGATIVE   Ketones, ur 20 (A) NEGATIVE mg/dL   Protein, ur NEGATIVE NEGATIVE mg/dL   Nitrite NEGATIVE NEGATIVE   Leukocytes,Ua NEGATIVE NEGATIVE   RBC / HPF 21-50 0 - 5 RBC/hpf   WBC, UA 6-10 0 - 5 WBC/hpf   Bacteria, UA RARE (A) NONE SEEN   Squamous Epithelial / HPF 0-5 0 - 5 /HPF   Mucus PRESENT    Amorphous Crystal PRESENT    Ca Oxalate Crys, UA PRESENT    Sperm, UA PRESENT     Comment: Performed at [facility], [address] [place], [place] [address]   MR THORACIC SPINE W WO CONTRAST  Result Date: 07/12/2022 CLINICAL DATA:  Initial evaluation for multiple sclerosis. EXAM: MRI THORACIC WITHOUT AND WITH CONTRAST TECHNIQUE: Multiplanar and multiecho pulse sequences of the thoracic spine were obtained without and with intravenous contrast. CONTRAST:  52mL GADAVIST GADOBUTROL 1 MMOL/ML IV SOLN COMPARISON:  None available. FINDINGS: Alignment: Physiologic with preservation of the normal thoracic kyphosis. No listhesis. Vertebrae: Vertebral body height maintained without acute or chronic  fracture. Diffuse loss of normal bone marrow signal, nonspecific but can be seen with anemia, smoking, obesity, and infiltrative/myelofibrotic marrow processes. No discrete or worrisome osseous lesions. No abnormal marrow edema or enhancement. Cord: On  sagittal STIR sequence, small focus of patchy signal abnormality seen involving the central cord at the level of T5 (series 39, image 9). Additional more prominent lesion seen involving the right hemi cord at the level of T7-8 (series 39, image 9). No other convincing lesions identified. Following contrast administration. The lesion at T7 demonstrates enhancement, consistent with active demyelination (series 52, image 9). Underlying cord caliber within normal limits. Paraspinal and other soft tissues: Unremarkable. Disc levels: No significant disc pathology seen within the thoracic spine. No disc bulge or focal disc herniation. No stenosis or impingement. IMPRESSION: 1. Patchy cord signal abnormality at the level of T5 and T7-8 as above, consistent with demyelinating disease/multiple sclerosis. The lesion at T7 demonstrates enhancement, consistent with active demyelination. 2. No significant disc pathology or stenosis within the thoracic spine. Electronically Signed   By: Rise Mu M.D.   On: 07/12/2022 00:26   MR BRAIN W CONTRAST  Result Date: 07/11/2022 CLINICAL DATA:  Initial evaluation for cranial nerve palsies, history of multiple sclerosis. EXAM: MRI HEAD WITH CONTRAST TECHNIQUE: Multiplanar, multiecho pulse sequences of the brain and surrounding structures were obtained with intravenous contrast. CONTRAST:  22mL GADAVIST GADOBUTROL 1 MMOL/ML IV SOLN COMPARISON:  Prior noncontrast brain MRI from earlier the same day. FINDINGS: Brain: Advanced cerebral atrophy with cerebral white matter changes related to multiple sclerosis again noted. Following contrast administration, several of these lesions demonstrate associated enhancement, compatible with  active demyelination. For reference purposes, the most prominent of these foci is seen at the left frontal centrum semi ovale and measures 9 mm (series 50, image 14). Additional prominent lesion noted at the right lateral midbrain (series 49, image 78). No other pathologic enhancement. Vascular: Normal intravascular enhancement seen throughout the major intracranial vasculature. Skull and upper cervical spine: No new finding. Sinuses/Orbits: No new finding. Other: None. IMPRESSION: Findings consistent with multiple sclerosis, with several new lesions demonstrating associated enhancement, compatible with active demyelination. Electronically Signed   By: Rise Mu M.D.   On: 07/11/2022 23:55   DG Chest Port 1 View  Result Date: 07/11/2022 CLINICAL DATA:  Weakness EXAM: PORTABLE CHEST 1 VIEW COMPARISON:  01/02/2013 FINDINGS: The heart size and mediastinal contours are within normal limits. Both lungs are clear. The visualized skeletal structures are unremarkable. IMPRESSION: No active disease. Electronically Signed   By: Helyn Numbers M.D.   On: 07/11/2022 23:41   MR CERVICAL SPINE WO CONTRAST  Result Date: 07/11/2022 CLINICAL DATA:  Initial evaluation for multiple sclerosis. EXAM: MRI CERVICAL SPINE WITHOUT CONTRAST TECHNIQUE: Multiplanar, multisequence MR imaging of the cervical spine was performed. No intravenous contrast was administered. COMPARISON:  Previous MRI from 06/04/2021. FINDINGS: Alignment: Examination moderately to severely degraded by motion artifact, limiting assessment. Straightening of the normal cervical lordosis.  No listhesis. Vertebrae: Vertebral body height maintained. Diffuse loss of normal bone marrow signal, nonspecific but can be seen with anemia, smoking, obesity, and infiltrative/myelofibrotic marrow processes. No focal marrow replacing lesion. No abnormal marrow edema. Cord: Evaluation of the cervical spinal cord limited by motion. Previously identified patchy cord  signal abnormality extending from the cervicomedullary junction to approximately C5-6, grossly similar as compared to previous exam. No obvious new lesions on this limited exam. No significant cord expansion or visible edema to suggest active demyelination. Underlying cord caliber remains within normal limits. Posterior Fossa, vertebral arteries, paraspinal tissues: Paraspinous soft tissues demonstrate no acute finding. Small sebaceous cyst noted within the subcutaneous fat of the left posterior neck. Normal flow voids seen within the  vertebral arteries bilaterally. Disc levels: C2-C3: Mild uncovertebral spurring without significant disc bulge. No canal or foraminal stenosis. C3-C4: Disc bulge with bilateral uncovertebral spurring. No spinal stenosis. Moderate left C4 foraminal narrowing. Right neural foramen remains patent. C4-C5: Degenerative intervertebral disc space narrowing with diffuse disc osteophyte complex. No significant spinal stenosis. Mild left C5 foraminal narrowing. Right neural foramina is patent. C5-C6: Degenerate intervertebral disc space narrowing with diffuse disc osteophyte complex. Flattening and partial effacement of the ventral thecal sac with resultant mild spinal stenosis. Mild right with moderate left C6 foraminal narrowing. Appearance is similar. C6-C7: Degenerative intervertebral disc space narrowing with diffuse disc osteophyte complex. No significant spinal stenosis. Mild to moderate left C7 foraminal narrowing. Right neural foramen veins patent. Appearance is similar. C7-T1:  Unremarkable. IMPRESSION: 1. Motion degraded exam. 2. Patchy cord signal abnormality extending from the cervicomedullary junction to approximately C5-6, consistent with history of multiple sclerosis. Overall, appearance is grossly similar to most recent MRI from 06/04/2021. No obvious new lesions or evidence for active demyelination. 3. Multilevel cervical spondylosis with resultant mild spinal stenosis at C5-6.  Associated moderate left C4 and C6 foraminal stenosis, with mild to moderate left C7 foraminal narrowing. Electronically Signed   By: Jeannine Boga M.D.   On: 07/11/2022 20:10   MR BRAIN WO CONTRAST  Result Date: 07/11/2022 CLINICAL DATA:  Initial evaluation for multiple sclerosis. EXAM: MRI HEAD WITHOUT CONTRAST TECHNIQUE: Multiplanar, multiecho pulse sequences of the brain and surrounding structures were obtained without intravenous contrast. COMPARISON:  Prior MRI from 06/04/2021. FINDINGS: Brain: Diffuse prominence of the CSF containing spaces, consistent with atrophy, advanced for age. Again seen are extensive patchy and confluent T2/FLAIR signal abnormality involving the periventricular, deep, and juxta cortical white matter both cerebral hemispheres. Patchy involvement of the descending white matter tracks, brainstem, and cerebellum. Overall, appearance is consistent with provided history of multiple sclerosis. These changes are progressed as compared to most recent MRI from 06/04/2021, with a few new and/or larger lesions (series 11, image 19 at the right frontal centrum semi ovale for example). Several of these lesions demonstrate prominent diffusion signal abnormality. While this could be related to T2 shine through, overall appearance is suspicious for possible active demyelination, although evaluation limited by lack of IV contrast. No evidence for acute or subacute infarct. Gray-white matter differentiation otherwise maintained. No other areas of chronic cortical infarction. No acute or chronic intracranial blood products. No mass lesion, midline shift or mass effect. No hydrocephalus or extra-axial fluid collection. Pituitary gland and suprasellar region within normal limits. Vascular: Major intracranial vascular flow voids are maintained. Skull and upper cervical spine: Cranial junctional limits. Bone marrow signal intensity normal. No scalp soft tissue abnormality. Sinuses/Orbits: Globes  orbital soft tissues within normal limits. Paranasal sinuses are largely clear. No mastoid effusion. Other: None. IMPRESSION: 1. Extensive T2/FLAIR signal abnormality involving the cerebral white matter as above, consistent with provided history of multiple sclerosis. Overall, these changes are progressed as compared to most recent MRI from 06/04/2021. Associated prominent diffusion signal abnormality about many of these lesions. While this could be related to T2 shine through, overall appearance is suspicious for possible active demyelination. Correlation with dedicated postcontrast imaging could be performed for confirmatory purposes as warranted. 2. Underlying advanced cerebral atrophy for age. Electronically Signed   By: Jeannine Boga M.D.   On: 07/11/2022 20:03    Pending Labs Unresulted Labs (From admission, onward)     Start     Ordered   07/12/22 0034  HIV  Antibody (routine testing w rflx)  (HIV Antibody (Routine testing w reflex) panel)  Once,   R        07/12/22 0035            Vitals/Pain Today's Vitals   07/11/22 2222 07/11/22 2225 07/11/22 2230 07/12/22 0030  BP:   (!) 125/58 128/83  Pulse: (!) 43 (!) 46 (!) 44 (!) 43  Resp:  14 16 15   Temp:      TempSrc:      SpO2:  98% 97% 98%  PainSc:        Isolation Precautions No active isolations  Medications Medications  methylPREDNISolone sodium succinate (SOLU-MEDROL) 1,000 mg in sodium chloride 0.9 % 50 mL IVPB (has no administration in time range)  enoxaparin (LOVENOX) injection 40 mg (has no administration in time range)  sodium chloride 0.9 % bolus 1,000 mL (0 mLs Intravenous Stopped 07/12/22 0035)  LORazepam (ATIVAN) injection 1 mg (1 mg Intravenous Given 07/11/22 1917)  midazolam (VERSED) injection 2 mg (2 mg Intravenous Given 07/11/22 2043)  droperidol (INAPSINE) 2.5 MG/ML injection 1.25 mg (1.25 mg Intravenous Given 07/11/22 2042)  diphenhydrAMINE (BENADRYL) injection 25 mg (25 mg Intravenous Given 07/11/22 2041)   gadobutrol (GADAVIST) 1 MMOL/ML injection 8 mL (8 mLs Intravenous Contrast Given 07/11/22 2141)    Mobility walks with person assist     Focused Assessments      If patient is a Neuro Trauma and patient is going to OR before floor call report to 4N Charge nurse: [phone] or [phone]   R Recommendations: See Admitting Provider Note  Report given to:   Additional Notes:

## 2022-07-12 NOTE — Evaluation (Addendum)
Physical Therapy Evaluation Patient Details Name: Bradley Brandt MRN: [id number] DOB: 12/22/1980 Today's Date: 07/12/2022  History of Present Illness  42 y.o. male [facility] with LE weakness and vision changes. MRI demonstrates active demyelination in the brain and at T7. PMH includes relapsing remitting MS, bipolar disorder, GAD, insomnia.  Clinical Impression  Pt presents to PT with deficits in functional mobility, strength, balance, gait, endurance, motor control and coordination. Pt with BLE weakness along with spasticity of extensor musculature. In sitting and standing the pt demonstrates a persistent posterior lean, requiring PT physical assistance to prevent falls at all times. Pt will benefit from aggressive mobilization in an effort to improve balance and reduce falls risk. PT recommends AIR consult at this time. PT recommends use of STEDY for transfers with nursing staff due to posterior lean.     Recommendations for follow up therapy are one component of a multi-disciplinary discharge planning process, led by the attending physician.  Recommendations may be updated based on patient status, additional functional criteria and insurance authorization.  Follow Up Recommendations Acute inpatient rehab (3hours/day)      Assistance Recommended at Discharge Frequent or constant Supervision/Assistance  Patient can return home with the following  Two people to help with walking and/or transfers;Two people to help with bathing/dressing/bathroom;Assistance with cooking/housework;Direct supervision/assist for medications management;Direct supervision/assist for financial management;Assist for transportation;Help with stairs or ramp for entrance    Equipment Recommendations Wheelchair (measurements PT)  Recommendations for Other Services  Rehab consult    Functional Status Assessment Patient has had a recent decline in their functional status and demonstrates the  ability to make significant improvements in function in a reasonable and predictable amount of time.     Precautions / Restrictions Precautions Precautions: Fall Precaution Comments: extensor tone Restrictions Weight Bearing Restrictions: No      Mobility  Bed Mobility Overal bed mobility: Needs Assistance Bed Mobility: Supine to Sit     Supine to sit: Mod assist          Transfers Overall transfer level: Needs assistance Equipment used: Rolling walker (2 wheels) Transfers: Sit to/from Stand, Bed to chair/wheelchair/BSC Sit to Stand: Mod assist   Step pivot transfers: Mod assist       General transfer comment: posterior lean, PT assists with weight shift at trunk, poor ability to flex hips to sit    Ambulation/Gait Ambulation/Gait assistance:  (deferred 2/2 high falls risk)                Stairs            Wheelchair Mobility    Modified Rankin (Stroke Patients Only)       Balance Overall balance assessment: Needs assistance Sitting-balance support: No upper extremity supported, Feet supported Sitting balance-Leahy Scale: Poor Sitting balance - Comments: modA Postural control: Posterior lean Standing balance support: Bilateral upper extremity supported, Reliant on assistive device for balance Standing balance-Leahy Scale: Poor Standing balance comment: modA, posterior lean                             Pertinent Vitals/Pain Pain Assessment Pain Assessment: Faces Faces Pain Scale: Hurts little more Pain Location: back Pain Descriptors / Indicators: Aching Pain Intervention(s): Monitored during session    Home Living Family/patient expects to be discharged to:: Private residence Living Arrangements: Alone Available Help at Discharge: Family;Available PRN/intermittently Type of Home: House Home Access: Ramped entrance  Home Layout: One level Home Equipment: Rollator (4 wheels);Cane - single point      Prior  Function Prior Level of Function : Needs assist             Mobility Comments: pt ambulates with SPC vs rollator ADLs Comments: pt's sister assistswith cooking and cleaning     Hand Dominance   Dominant Hand: Right    Extremity/Trunk Assessment   Upper Extremity Assessment Upper Extremity Assessment: Defer to OT evaluation    Lower Extremity Assessment Lower Extremity Assessment: RLE deficits/detail;LLE deficits/detail RLE Deficits / Details: grossly 4/5, PT notes extensor spasticity with attempts at standing LLE Deficits / Details: grossly 4/5, PT notes extensor spasticity with attempts at standing    Cervical / Trunk Assessment Cervical / Trunk Assessment: Other exceptions Cervical / Trunk Exceptions: posterior preference  Communication   Communication: Other (comment) (mild dysarthria)  Cognition Arousal/Alertness: Awake/alert Behavior During Therapy: WFL for tasks assessed/performed Overall Cognitive Status: Impaired/Different from baseline Area of Impairment: Awareness                           Awareness: Emergent            General Comments General comments (skin integrity, edema, etc.): VSS on RA    Exercises     Assessment/Plan    PT Assessment Patient needs continued PT services  PT Problem List Decreased strength;Decreased mobility;Decreased activity tolerance;Decreased balance;Decreased coordination;Decreased knowledge of use of DME;Decreased safety awareness       PT Treatment Interventions DME instruction;Gait training;Functional mobility training;Therapeutic activities;Therapeutic exercise;Balance training;Neuromuscular re-education;Cognitive remediation;Patient/family education;Wheelchair mobility training    PT Goals (Current goals can be found in the Care Plan section)  Acute Rehab PT Goals Patient Stated Goal: to return to ambulation PT Goal Formulation: With patient Time For Goal Achievement: 07/26/22 Potential to Achieve  Goals: Fair    Frequency Min 3X/week     Co-evaluation               AM-PAC PT "6 Clicks" Mobility  Outcome Measure Help needed turning from your back to your side while in a flat bed without using bedrails?: A Little Help needed moving from lying on your back to sitting on the side of a flat bed without using bedrails?: A Lot Help needed moving to and from a bed to a chair (including a wheelchair)?: A Lot Help needed standing up from a chair using your arms (e.g., wheelchair or bedside chair)?: A Lot Help needed to walk in hospital room?: Total Help needed climbing 3-5 steps with a railing? : Total 6 Click Score: 11    End of Session   Activity Tolerance: Patient tolerated treatment well Patient left: in chair;with call bell/phone within reach;with chair alarm set Nurse Communication: Mobility status PT Visit Diagnosis: Other abnormalities of gait and mobility (R26.89);Muscle weakness (generalized) (M62.81);Other symptoms and signs involving the nervous system (R29.898)    Time: 5009-3818 PT Time Calculation (min) (ACUTE ONLY): 27 min   Charges:   PT Evaluation $PT Eval Low Complexity: [place], PT, DPT Acute Rehabilitation Office [phone]   Zenaida Niece 07/12/2022, 9:13 AM

## 2022-07-12 NOTE — Progress Notes (Addendum)
Subjective:  Is hungry. Says before he came to hospital problem was that he couldn't move specifically his legs. Can move them now. Felt like they were weak. Normally has trouble with movement. Sometimes can move, sometimes can move really well. On a normal day he doesn't have trouble with his legs, can walk to bus stop, etc. Hasn't been able to get OOB since being here. Also having trouble with arms. No trouble swallowing, shortness of breath. Was also having LBP. More towards center of back. Goes up his spine. Doesn't know what it could have been from. Had a fall and hit his back at home . Falls backwards often due to balance issues. Uses walker at home, uses it on front. Would rather fall back than forward. No urinary sx. Has missed last 2 infusions and says it's because he doesn't have phone or way to keep track of infusions/my chart/etc. No home phone, has cell phone but doesn't know where it is.   Objective:  Vital signs in last 24 hours: Vitals:   07/12/22 0217 07/12/22 0351 07/12/22 0354 07/12/22 0459  BP:  (!) 100/50 (!) 92/48 (!) 112/53  Pulse:  (!) 51 (!) 57 (!) 48  Resp:  18 18 20   Temp:  98 F (36.7 C) 99 F (37.2 C) 97.9 F (36.6 C)  TempSrc:    Oral  SpO2:  99% 97% 97%  Weight: 76 kg     Height: 5\' 11"  (1.803 m)      Constitutional: Resting in bed, no acute distress, very somnolent HENT: normocephalic atraumatic, L posterior cervical/occipital 1 cm firm mobile mass Cardiovascular: regular rate and rhythm, no m/r/g Pulmonary/Chest: normal work of breathing on room air, lungs clear to auscultation bilaterally Abdominal: soft, non-tender, non-distended MSK: normal bulk and tone Neurological: A&O, attentive, CN 2-12 grossly intact, finger to nose intact, rapid alternating movements intact, heel to shin intact, 5/5 bilateral UE and LE strength.  Assessment/Plan:  Principal Problem:   Multiple sclerosis exacerbation ([place])  Bradley Brandt is a 42 y.o. person living with  a history of relapsing remitting MS, bipolar disorder, GAD, insomnia presenting with concerns of leg weakness and vision changes.  Patient admitted for further evaluation and management of MS flareup.   #Flareup of relapsing remitting MS Patient with 3-day history of leg weakness, and acute vision changes.  Patient has been unable to walk and felt numbness in his legs.MRI showing active demyelination in the brain as well as at the level of T7.  Neurology consulted starting high-dose steroids for MS flareup.  Patient usually takes to Tysabri infusions as well as Dalfampridine daily for MS.  Patient's last infusion was in November per charting.  Patient is supposed to get infusions of Tysabri monthly but appears to have missed recent appointments due to issues with his phone and access to [place]. Working with pharmacy to potentially get injection inpatient.Patient has seen neurology recently, who was working him up for JCV and getting vitamin D levels.  PT recommending CIR. -Neurology following -1 g Solu-Medrol daily for 5 days -protonix 40mg  daily with steroid -hold home Dalfampridine 10 mg every 12 hours(not on formulary) -Hold home vitamin D weekly -start home baclofen 20 mg 3 times daily -start home gabapentin 800 mg bid -JCV antibody testing   L posterior cervical/occipital 1 cm firm mobile mass  Patient denies any URI symptoms. No evidence of scalp or surrounding area rash. May be a lymph node. Feels a bit firm for lipoma. -F/u ultrasound  PyuriaHemoglobinuria UA positive for hemoglobin, with 21-50 red blood cells per high-power field.No dysuria, no frequency, no flank pain. Will need to follow up in the outpatient setting. Given no spasticity or other classic urinary sxs will not treat for UTI.   #Bipolar 1 disorder Patient has a past medical history of bipolar 1 disorder.  Patient currently on Seroquel 200 mg at bedtime.  No acute concerns at this time. -Continue Seroquel 200mg  qhs    #GAD Patient has a past medical history of GAD.  Patient initially was not restarted on Lexapro or hydroxyzine.  Patient was restarted back on gabapentin 800 mg twice daily at last psychiatry appointment. -home gabapentin    #Substance use disorder Patient has a past medical history of substance use disorder.  Unclear what substances patient uses given patient has somnolence and I cannot confirm.  Per charting, patient uses "street drugs" and per neurologist, patient could benefit from Suboxone.  Patient denies use at this time, UDS not obtained on admission. -Monitor COWS score   Vitamin D Deficiency  Patient with past medical history of vitamin D deficiency.  Patient's most recent vitamin D level 20.6 2 weeks ago.  Will need vitamin D supplements during hospitalization. -Hold home supplementation of 50,000 units weekly.  Dispo: Anticipated discharge in approximately 2 day(s).   Iona Coach, MD 07/12/2022, 11:03 AM Pager: Iona Coach, MD Internal Medicine Resident, PGY-1 [facility]  Pager: [phone] After 5pm on weekdays and 1pm on weekends: On Call pager [phone]
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
## 2022-07-12 NOTE — ED Provider Notes (Signed)
D/w internal med resident - pt to be admitted to their service with neuro consulting    Ripley Fraise, MD 07/12/22 [phone]
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
## 2022-07-12 NOTE — Progress Notes (Signed)
Physical Therapy Treatment  Patient Details Name: Bradley Brandt MRN: [id number] DOB: 1981-03-03 Today's Date: 07/12/2022   History of Present Illness Pt is a 42 y/o male who presents to [facility] hospital on 07/11/2022 with LE weakness and vision changes. MRI demonstrates active demyelination in the brain and at T7. PMH includes relapsing remitting MS, bipolar disorder, GAD, insomnia.    PT Comments    Pt was received attempting to exit bed without assist to use the bathroom.  Bed was actively alarming. PT assisted pt to sit EOB to use the urinal. Noted posterior lean in sitting which required pt to lay in supine to void and then sit back up to manage putting the top on the urinal. Pt demonstrated good initiation of movement to reposition in bed and therapist assisted for optimal positioning. Continue to recommend AIR level rehab to maximize functional independence and safety prior to return home with family support.    Recommendations for follow up therapy are one component of a multi-disciplinary discharge planning process, led by the attending physician.  Recommendations may be updated based on patient status, additional functional criteria and insurance authorization.  Follow Up Recommendations  Acute inpatient rehab (3hours/day)     Assistance Recommended at Discharge Frequent or constant Supervision/Assistance  Patient can return home with the following Two people to help with walking and/or transfers;Two people to help with bathing/dressing/bathroom;Assistance with cooking/housework;Direct supervision/assist for medications management;Direct supervision/assist for financial management;Assist for transportation;Help with stairs or ramp for entrance   Equipment Recommendations  Wheelchair (measurements PT)    Recommendations for Other Services Rehab consult     Precautions / Restrictions Precautions Precautions: Fall Precaution Comments: Posterior lean/extensor  tone Restrictions Weight Bearing Restrictions: No     Mobility  Bed Mobility Overal bed mobility: Needs Assistance Bed Mobility: Supine to Sit, Sit to Supine     Supine to sit: Min guard Sit to supine: Min guard   General bed mobility comments: Pt demonstrated the ability to perform trunk flexion to sitting position (with railing use), and return to supine without assist. Pt with uncontrolled trunk extension to lay back due to posterior lean, but able to recover and sit back up. Therapist assisted in repositioning in bed.    Transfers                        Ambulation/Gait                   Stairs             Wheelchair Mobility    Modified Rankin (Stroke Patients Only)       Balance Overall balance assessment: Needs assistance Sitting-balance support: No upper extremity supported, Feet supported Sitting balance-Leahy Scale: Poor Sitting balance - Comments: With dynamic sitting activity, pt with posterior lean and difficulty maintaining forward trunk flexion. Postural control: Posterior lean                                  Cognition Arousal/Alertness: Awake/alert Behavior During Therapy: WFL for tasks assessed/performed Overall Cognitive Status: Impaired/Different from baseline Area of Impairment: Awareness, Safety/judgement                         Safety/Judgement: Decreased awareness of safety, Decreased awareness of deficits Awareness: Emergent   General Comments: When PT entered, bed alarming and pt had scooted to foot  of bed to try and get up to use the bathroom. Railing up and pt trying to scoot around it.        Exercises      General Comments        Pertinent Vitals/Pain Pain Assessment Pain Assessment: Faces Faces Pain Scale: No hurt    Home Living Family/patient expects to be discharged to:: Private residence Living Arrangements: Alone Available Help at Discharge: Family;Available  PRN/intermittently (Sister, girlfriend) Type of Home: House Home Access: Ramped entrance       Home Layout: One level Home Equipment: Rollator (4 wheels);Cane - single point;Grab bars - tub/shower;Shower seat Additional Comments: Pt reports that he did not need to use his shower chair at baseline when bathing.    Prior Function            PT Goals (current goals can now be found in the care plan section) Acute Rehab PT Goals Patient Stated Goal: to return to ambulation PT Goal Formulation: With patient Time For Goal Achievement: 07/26/22 Potential to Achieve Goals: Fair Progress towards PT goals: Progressing toward goals    Frequency    Min 3X/week      PT Plan Current plan remains appropriate    Co-evaluation              AM-PAC PT "6 Clicks" Mobility   Outcome Measure  Help needed turning from your back to your side while in a flat bed without using bedrails?: A Little Help needed moving from lying on your back to sitting on the side of a flat bed without using bedrails?: A Little Help needed moving to and from a bed to a chair (including a wheelchair)?: A Lot Help needed standing up from a chair using your arms (e.g., wheelchair or bedside chair)?: A Lot Help needed to walk in hospital room?: Total Help needed climbing 3-5 steps with a railing? : Total 6 Click Score: 12    End of Session Equipment Utilized During Treatment: Gait belt Activity Tolerance: Patient tolerated treatment well Patient left: in chair;with call bell/phone within reach;with chair alarm set Nurse Communication: Mobility status PT Visit Diagnosis: Other abnormalities of gait and mobility (R26.89);Muscle weakness (generalized) (M62.81);Other symptoms and signs involving the nervous system (R29.898)     Time: 1209-1220 PT Time Calculation (min) (ACUTE ONLY): 11 min  Charges:  $Therapeutic Activity: 8-22 mins                     Rolinda Roan, PT, DPT Acute Rehabilitation  Services Secure Chat Preferred Office: [phone]    Thelma Comp 07/12/2022, 2:34 PM

## 2022-07-12 NOTE — Consult Note (Addendum)
Neurology Consultation  Reason for Consult: MS exacerbation Referring Physician: Dr. Adela Lank  CC: Increasing falls  History is obtained from: Chart  HPI: Bradley Brandt is a 42 y.o. 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 Olive Bass at [facility] neurology, coming in with multiple falls and generalized weakness that has been happening over the past few weeks.  He saw his neurologist 2 weeks ago when he was feeling at his baseline but started having more frequent falls and started getting more weakness on the left in comparison to the right over the past few days which has not gone away.  Also complains of some back pain with this. When I saw the patient, he had been given multiple doses of sedatives to be able to get the MRI and hence my exam was very limited. That said, his MR imaging does show enhancing lesions in the brain and I have recommended admission for IV steroids.  ROS: Unable to reliably ascertain given his poor mentation  Past Medical History:  Diagnosis Date   Bipolar 1 disorder (HCC)    Bipolar disorder, unspecified (HCC) 01/03/2013   MS (multiple sclerosis) (HCC) dx 2006   relapsing-remitting right sided weakness   Neuromuscular disorder (HCC)    MS     Family History  Adopted: Yes  Problem Relation Age of Onset   Lupus Mother    Ataxia Neg Hx    Chorea Neg Hx    Dementia Neg Hx    Mental retardation Neg Hx    Migraines Neg Hx    Multiple sclerosis Neg Hx    Neurofibromatosis Neg Hx    Neuropathy Neg Hx    Parkinsonism Neg Hx    Seizures Neg Hx    Stroke Neg Hx      Social History:   reports that he has been smoking cigarettes. He has been smoking an average of 1 pack per day. He has never used smokeless tobacco. He reports that he does not currently use alcohol after a past usage of about 72.0 standard drinks of alcohol per week. He reports that he does not currently use drugs after having used the following drugs:  Marijuana. Frequency: 7.00 times per week.  Medications  Current Facility-Administered Medications:    methylPREDNISolone sodium succinate (SOLU-MEDROL) 1,000 mg in sodium chloride 0.9 % 50 mL IVPB, 1,000 mg, Intravenous, Once, Zadie Rhine, MD  Current Outpatient Medications:    acetaminophen (TYLENOL) 500 MG tablet, Take 500 mg by mouth 2 (two) times daily., Disp: , Rfl:    baclofen (LIORESAL) 20 MG tablet, Take 1 tablet (20 mg total) by mouth 3 (three) times daily., Disp: 90 tablet, Rfl: 11   dalfampridine 10 MG TB12, Take 1 tablet by mouth every 12 hours., Disp: 60 tablet, Rfl: 11   DULoxetine (CYMBALTA) 30 MG capsule, Take 30 mg by mouth every morning., Disp: , Rfl:    gabapentin (NEURONTIN) 800 MG tablet, Take 1 tablet (800 mg total) by mouth 2 (two) times daily., Disp: 60 tablet, Rfl: 11   lidocaine (HM LIDOCAINE PATCH) 4 %, Place 1 patch onto the skin daily., Disp: 15 patch, Rfl: 0   natalizumab (TYSABRI) 300 MG/15ML injection, Inject 15 mLs (300 mg total) into the vein every 28 (twenty-eight) days., Disp: 15 mL, Rfl: 5   QUEtiapine (SEROQUEL) 200 MG tablet, Take 1 tablet (200 mg total) by mouth at bedtime. Take a half tablet at night for 4 days then increase to a whole  tablet each night., Disp: 90 tablet, Rfl: 2   Vitamin D, Ergocalciferol, (DRISDOL) 1.25 MG (50000 UNIT) CAPS capsule, Take 1 capsule (50,000 Units total) by mouth every 7 (seven) days., Disp: 13 capsule, Rfl: 3  Exam: Current vital signs: BP (!) 125/58   Pulse (!) 44   Temp 98 F (36.7 C) (Oral)   Resp 16   SpO2 97%  Vital signs in last 24 hours: Temp:  [98 F (36.7 C)-98.2 F (36.8 C)] 98 F (36.7 C) (01/23 1952) Pulse Rate:  [43-65] 44 (01/23 2230) Resp:  [14-18] 16 (01/23 2230) BP: (108-148)/(58-95) 125/58 (01/23 2230) SpO2:  [96 %-100 %] 97 % (01/23 2230) General: Very stuporous, opens eyes to voice but poor attention concentration HEENT: Normocephalic atraumatic Lungs: Clear Cardiovascular:  Regular rhythm Abdomen nondistended nontender Extremities warm well-perfused Neurological exam Had received multiple medications due to claustrophobia so that he could complete his MRI. Extremely stuporous Opens eyes to voice but has extremely poor attention concentration Speech is mildly dysarthric Cranial nerves: He has an APD on the right, extraocular movements difficult to assess but his gaze is midline, does not blink to threat from either side.  Face appears symmetric. Motor examination, within constraints of his mentation with left-sided weakness with left upper and lower extremity possibly 4 out of 5 compared to 4+/5 on the right. Sensation: Intact Coordination difficult to assess given his mentation DTRs: Hyperreflexic all over with clonus at bilateral ankles.   Labs I have reviewed labs in epic and the results pertinent to this consultation are:  CBC    Component Value Date/Time   WBC 7.0 07/11/2022 1705   RBC 4.79 07/11/2022 1705   HGB 15.1 07/11/2022 1705   HGB 17.1 06/27/2022 1554   HCT 45.3 07/11/2022 1705   HCT 50.4 06/27/2022 1554   PLT 198 07/11/2022 1705   PLT 183 06/27/2022 1554   MCV 94.6 07/11/2022 1705   MCV 93 06/27/2022 1554   MCH 31.5 07/11/2022 1705   MCHC 33.3 07/11/2022 1705   RDW 13.7 07/11/2022 1705   RDW 13.5 06/27/2022 1554   LYMPHSABS 2.3 07/11/2022 1705   LYMPHSABS 1.5 06/27/2022 1554   MONOABS 0.5 07/11/2022 1705   EOSABS 0.1 07/11/2022 1705   EOSABS 0.1 06/27/2022 1554   BASOSABS 0.0 07/11/2022 1705   BASOSABS 0.0 06/27/2022 1554    CMP     Component Value Date/Time   NA 142 07/11/2022 1705   NA 143 12/12/2021 1426   K 3.9 07/11/2022 1705   CL 109 07/11/2022 1705   CO2 21 (L) 07/11/2022 1705   GLUCOSE 86 07/11/2022 1705   BUN 17 07/11/2022 1705   BUN 24 12/12/2021 1426   CREATININE 1.24 07/11/2022 1705   CALCIUM 9.0 07/11/2022 1705   PROT 6.8 07/11/2022 1705   PROT 7.4 12/12/2021 1426   ALBUMIN 4.0 07/11/2022 1705    ALBUMIN 4.8 12/12/2021 1426   AST 39 07/11/2022 1705   ALT 26 07/11/2022 1705   ALKPHOS 72 07/11/2022 1705   BILITOT 0.4 07/11/2022 1705   BILITOT 0.3 12/12/2021 1426   GFRNONAA >60 07/11/2022 1705   GFRAA 76 04/08/2020 1044    Lipid Panel     Component Value Date/Time   CHOL 173 04/13/2021 0942   TRIG 119 04/13/2021 0942   HDL 37 (L) 04/13/2021 0942   CHOLHDL 4.7 04/13/2021 0942   LDLCALC 114 (H) 04/13/2021 0942  Urinalysis negative for acute process.    Imaging I have reviewed the images obtained: MR  brain with and without contrast shows extensive T2/FLAIR signal abnormalities in the cerebral white matter consistent with history of MS.  Overall lesion load has increased from December 2022 with prominent diffusion signal abnormalities about many of those lesions, while this could be T2 shine through, postcontrast imaging was recommended and upon completion revealed multiple areas of abnormal enhancement, most prominently left frontal semiovale and right lateral midbrain.  Findings concerning for active demyelination. MRI of the C-spine was done without contrast because initially he could not tolerate the MRI-patchy cord signal abnormality from the cervical medullary junction to C5-6 consistent with history of MS.  Overall unchanged from December 2022.  Multilevel DJD. MRI of the thoracic spine-with and without contrast with patchy cord signal abnormality at T5 and T7-8 consistent with MS.  There is abnormal enhancement at T7 consistent with active demyelination.  Chest x-ray with no evidence of acute process.  Assessment:  42 year old with past medical history of relapsing remitting MS on Tysabri presenting with several days to couple weeks worth of increasing falls and increasing left-sided weakness with brain imaging revealing multiple enhancing lesion as well as thoracic imaging also revealing a new enhancing lesion in the T-spine consistent with active demyelination. He would  benefit from 5 days of high-dose IV steroids for this MS flareup.  Impression: Flareup of relapsing remitting MS  Recommendations: Admit to hospitalist Frequent neurochecks Telemetry No evidence of active UTI or pneumonia that could cause fluid exacerbation.  Given findings of active demyelination on brain and T-spine MRI, I would recommend Solu-Medrol 1 g IV daily for 5 days. Neurology will continue to peripherally follow with you He needs to follow-up with Dr. Richardean Chimera 2 to 4 weeks after discharge at [facility] neurology.  -- Amie Portland, MD Neurologist [facility] Pager: [phone]

## 2022-07-13 ENCOUNTER — Other Ambulatory Visit (HOSPITAL_COMMUNITY): Payer: Self-pay

## 2022-07-13 MED ORDER — HYDROXYZINE HCL 10 MG PO TABS
10.0000 mg | ORAL_TABLET | Freq: Three times a day (TID) | ORAL | Status: DC | PRN
  Administered 2022-07-13 – 2022-07-18 (×8): 10 mg via ORAL
  Filled 2022-07-13 (×9): qty 1

## 2022-07-13 NOTE — Plan of Care (Signed)
Patient has had 2 incontinence Bms today.  Patient is able to stand with assistance and holding walker.  He cannot walk.  He is able to shuffle his left foot.  Pt is pleasant.  He gets somewhat anxious when gf is not around.  NADN. Sreece, RN

## 2022-07-13 NOTE — TOC CM/SW Note (Addendum)
  Transition of Care [facility]) Screening Note   Patient Details  Name: Bradley Brandt Date of Birth: 1980/11/19   Message from pharmacy : He will need an appointment for his Tysabri infusion at [facility] infusion for the day after his discharge and a reminder card   NCM included CIR on message. If patient discharges home directly from 6N , please notify NCM to arrange appointment. NCM will continue to follow    Transition of Care Department [facility]) has reviewed patient and no TOC needs have been identified at this time. We will continue to monitor patient advancement through interdisciplinary progression rounds. If new patient transition needs arise, please place a TOC consult.

## 2022-07-13 NOTE — Progress Notes (Signed)
Subjective:   He has not gotten up and out of bed. He had a bowel movement in the bed this morning. Nurse came and helped clean him up. He feels that he can move his legs but can't move them like he normally does. No new upper extremity weakness. He has no breathing or swallowing difficulties. His cellphone is broken. He can lift his right leg up. Objective:  Vital signs in last 24 hours: Vitals:   07/12/22 1454 07/12/22 1945 07/13/22 0410 07/13/22 0828  BP: (!) 141/73 128/67 121/69 123/68  Pulse: 70 (!) 56 (!) 44 (!) 48  Resp: 17 16  17   Temp: 98 F (36.7 C) 98.7 F (37.1 C) 97.7 F (36.5 C)   TempSrc: Oral Oral Oral   SpO2: 100% 98% 99% 97%  Weight:      Height:       Constitutional: Resting in bed, no acute distress, very somnolent HENT: normocephalic atraumatic, L posterior cervical/occipital 1 cm firm mobile mass Cardiovascular: regular rate and rhythm, no m/r/g Pulmonary/Chest: normal work of breathing on room air, lungs clear to auscultation bilaterally Abdominal: soft, non-tender, non-distended MSK: normal bulk and tone Neurological: A&O, attentive, CN 2-12 grossly intact, 5/5 bilateral UE and LLE strength, 3/5RLE strength at the hip and scissoring gait.  Assessment/Plan:  Principal Problem:   Multiple sclerosis exacerbation ([place])  Bradley Brandt is a 42 y.o. person living with a history of relapsing remitting MS, bipolar disorder, GAD, insomnia presenting with concerns of leg weakness and vision changes.  Patient admitted for further evaluation and management of MS flareup.   #Flareup of relapsing remitting MS Patient continues to endorse R leg weakness. He says he has not gotten out of bed yet and did not work with PT yesterday, although he did work with them. On exam today he had 3/5 strength in the RLE compared to 5/5 yesterday. I would not think his strength would decrease after high dose solumedrol. Per chart review it appears he has many inconsistencies in what  he is telling providers/healthcare team members and it is difficult to determine clinically whether he is improving or not. Will continue to monitor until a decision on dispo to CIR is made. -Neurology following -1 g Solu-Medrol daily for 5 days -hold home Dalfampridine 10 mg every 12 hours(not on formulary) -Hold home vitamin D weekly -continue home baclofen 20 mg 3 times daily -continue home gabapentin 800 mg bid -JCV antibody pending -He will need an appointment for his Tysabri infusion at [facility] infusion for the day after his discharge and a reminder card    L posterior cervical/occipital 1 cm firm mobile mass  Patient denies any URI symptoms. No evidence of scalp or surrounding area rash. May be a lymph node. Feels a bit firm for lipoma. Per chart review appears stable since 2019 on multiple MRIs, although there was never a comment made on the mass. Still pending ultrasound read. -F/u ultrasound  #Bipolar 1 disorder Patient has a past medical history of bipolar 1 disorder.  Patient currently on Seroquel 200 mg at bedtime.  No acute concerns at this time. -Continue Seroquel 200mg  qhs   #GAD Patient has a past medical history of GAD.  Patient initially was not restarted on Lexapro or hydroxyzine.  Patient was restarted back on gabapentin 800 mg twice daily at last psychiatry appointment. -home gabapentin    #Substance use disorder Patient has a past medical history of substance use disorder.  Unclear what substances patient uses given  patient has somnolence and I cannot confirm.  Per charting, patient uses "street drugs" and per neurologist, patient could benefit from Suboxone.  Patient denies use at this time, UDS not obtained on admission. -Monitor COWS score   Vitamin D Deficiency  Patient with past medical history of vitamin D deficiency.  Patient's most recent vitamin D level 20.6 2 weeks ago.  Will need vitamin D supplements during hospitalization. -Hold home supplementation of  50,000 units weekly.  Dispo: Anticipated discharge in approximately <2 day(s).   Iona Coach, MD 07/13/2022, 2:13 PM Pager: Iona Coach, MD Internal Medicine Resident, PGY-1 [facility]  Pager: [phone] After 5pm on weekdays and 1pm on weekends: On Call pager [phone]
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
## 2022-07-14 DIAGNOSIS — G35 Multiple sclerosis: Secondary | ICD-10-CM | POA: Diagnosis not present
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
## 2022-07-14 NOTE — Progress Notes (Signed)
Physical Therapy Treatment Patient Details Name: Bradley Brandt MRN: [id number] DOB: 01/02/1981 Today's Date: 07/14/2022   History of Present Illness Pt is a 42 y/o male who presents to [facility] hospital on 07/11/2022 with LE weakness and vision changes. MRI demonstrates active demyelination in the brain and at T7. PMH includes relapsing remitting MS, bipolar disorder, GAD, insomnia.    PT Comments    Pt is slowly progressing towards goals. Pt continues with poor kinesthetic awareness of the trunk with heavy posterior lean during standing/sitting and with any functional dynamic movements. Pt requires 2 person assist for dynamic functional activities out of bed at this time. Due to pt current functional status, PLOF, home set up, co-morbidities and available assistance at home recommending skilled physical therapy services in AIR setting on discharge from acute care hospital setting to decrease risk for falls, skin break down, injury, immobility and re-hospitalization. No signs/symptoms of distress throughout session.     Recommendations for follow up therapy are one component of a multi-disciplinary discharge planning process, led by the attending physician.  Recommendations may be updated based on patient status, additional functional criteria and insurance authorization.  Follow Up Recommendations  Acute inpatient rehab (3hours/day)     Assistance Recommended at Discharge Frequent or constant Supervision/Assistance  Patient can return home with the following Two people to help with walking and/or transfers;Assistance with cooking/housework;Assist for transportation;Help with stairs or ramp for entrance   Equipment Recommendations  Wheelchair (measurements PT)    Recommendations for Other Services Rehab consult     Precautions / Restrictions Precautions Precautions: Fall Precaution Comments: Posterior lean/extensor tone Restrictions Weight Bearing Restrictions: No     Mobility  Bed  Mobility Overal bed mobility: Needs Assistance Bed Mobility: Supine to Sit, Sit to Supine     Supine to sit: Min guard Sit to supine: Min guard   General bed mobility comments: Pt requires guarding and verbal cues for sequencing to get sitting EOB and to get from sitting >Supine. Patient Response: Anxious, Cooperative  Transfers Overall transfer level: Needs assistance Equipment used: Rolling walker (2 wheels) Transfers: Sit to/from Stand Sit to Stand: Mod assist           General transfer comment: Mod A with heavy multi modal cueing for transitioning from sitting to standing, Once in standing pt requires Mod A +2 for standing due to heavy posterior lean    Ambulation/Gait               General Gait Details: Pt was able to take some side steps at EOB. Pt was not bending knees and frequently would step forward when asked to step to the L while not progressing trunk only LE increasing posterior lean.       Balance Overall balance assessment: Needs assistance Sitting-balance support: Bilateral upper extremity supported, Single extremity supported, No upper extremity supported, Feet supported Sitting balance-Leahy Scale: Poor Sitting balance - Comments: Pt requires intermittent Heavy support at turnk due to posterior lean. Postural control: Posterior lean Standing balance support: Reliant on assistive device for balance, During functional activity, Single extremity supported Standing balance-Leahy Scale: Zero Standing balance comment: Heavy posterior lean requiring plus 2 assistance and multi modal cueing to improve for first stand. Improved during second stand but deteriorated with any dynamic movement.                            Cognition Arousal/Alertness: Awake/alert Behavior During Therapy: WFL for tasks assessed/performed  Overall Cognitive Status: Impaired/Different from baseline Area of Impairment: Awareness, Safety/judgement                          Safety/Judgement: Decreased awareness of safety, Decreased awareness of deficits Awareness: Emergent   General Comments: Pt seems more oriented than previously but seems confused as to why he is unable to move as he was previously frequently asking throughout session what was going on.               Pertinent Vitals/Pain Pain Assessment Pain Assessment: 0-10 Pain Score: 8  Pain Location: bil legs Pain Descriptors / Indicators: Aching, Sore, Constant Pain Intervention(s): Limited activity within patient's tolerance, Monitored during session     PT Goals (current goals can now be found in the care plan section) Acute Rehab PT Goals Patient Stated Goal: to return to ambulation PT Goal Formulation: With patient Time For Goal Achievement: 07/26/22 Potential to Achieve Goals: Fair Progress towards PT goals: Progressing toward goals    Frequency    Min 3X/week      PT Plan Current plan remains appropriate       AM-PAC PT "6 Clicks" Mobility   Outcome Measure  Help needed turning from your back to your side while in a flat bed without using bedrails?: A Little Help needed moving from lying on your back to sitting on the side of a flat bed without using bedrails?: A Little Help needed moving to and from a bed to a chair (including a wheelchair)?: A Lot Help needed standing up from a chair using your arms (e.g., wheelchair or bedside chair)?: A Lot Help needed to walk in hospital room?: Total Help needed climbing 3-5 steps with a railing? : Total 6 Click Score: 12    End of Session Equipment Utilized During Treatment: Gait belt Activity Tolerance: Patient tolerated treatment well Patient left: in bed;with bed alarm set;with call bell/phone within reach Nurse Communication: Mobility status PT Visit Diagnosis: Other abnormalities of gait and mobility (R26.89);Muscle weakness (generalized) (M62.81);Other symptoms and signs involving the nervous system (R29.898)      Time: 5916-3846 PT Time Calculation (min) (ACUTE ONLY): 23 min  Charges:  $Therapeutic Activity: 23-37 mins                    Tomma Rakers, DPT, CLT  Acute Rehabilitation Services Office: [phone] (Secure chat preferred)    Ander Purpura 07/14/2022, 3:46 PM

## 2022-07-14 NOTE — Progress Notes (Signed)
   Subjective:   Says he wants to move and get up and out of bed. Still needs help to get out of bed. Still having trouble with his leg but it's a little better objectively. Otherwise no trouble breathing, eating/swallowing, no new weakness. Objective:  Vital signs in last 24 hours: Vitals:   07/13/22 1733 07/13/22 1952 07/14/22 0508 07/14/22 0839  BP: 126/65 131/61 113/62 113/63  Pulse:  (!) 53 (!) 50 71  Resp: 16  17 16   Temp: 98.4 F (36.9 C) 98 F (36.7 C) 98 F (36.7 C) 97.9 F (36.6 C)  TempSrc: Oral Oral Oral Oral  SpO2: 98% 98% 98% 100%  Weight:      Height:      Physical exam grossly unchanged since prior Constitutional: Resting in bed, no acute distress, very somnolent HENT: normocephalic atraumatic, L posterior cervical/occipital 1 cm firm mobile mass Cardiovascular: regular rate and rhythm, no m/r/g Pulmonary/Chest: normal work of breathing on room air, lungs clear to auscultation bilaterally Abdominal: soft, non-tender, non-distended MSK: normal bulk and tone Neurological: A&O, attentive, CN 2-12 grossly intact, 5/5 bilateral UE and LLE strength, 3/5RLE strength at the hip and scissoring gait.  Assessment/Plan:  Principal Problem:   Multiple sclerosis exacerbation ([place])  Bradley Brandt is a 42 y.o. person living with a history of relapsing remitting MS, bipolar disorder, GAD, insomnia presenting with concerns of leg weakness and vision changes.  Patient admitted for further  management of MS flareup and pending rehab discharge plan.   #Flareup of relapsing remitting MS Patient continues to endorse R leg weakness. Not able to get out of bed by himself. Still having R hip weakness that is difficult to assess in the bed but otherwise all joints of the bilateral LEs are 5/5 strength. Still pending CIR admission decision, otherwise medically stable for discharge and will need ongoing rehab/therapy outside of the hospital. -Neurology following -1 g Solu-Medrol daily  for 5 days -hold home Dalfampridine 10 mg every 12 hours(not on formulary) -Hold home vitamin D weekly -continue home baclofen 20 mg 3 times daily -continue home gabapentin 800 mg bid -JCV antibody pending -He will need an appointment for his Tysabri infusion at [facility] infusion for the day after his discharge and a reminder card    L posterior cervical/occipital 1 cm firm mobile mass  Ultrasound read as sebaceous cyst or similar subdermal lesion, no follow up needed.  Bipolar 1 disorder -Continue Seroquel 200mg  qhs   GAD Patient has a past medical history of GAD.  Patient initially was not restarted on Lexapro or hydroxyzine.  Patient was restarted back on gabapentin 800 mg twice daily at last psychiatry appointment. -home gabapentin   Vitamin D Deficiency  -Hold home supplementation of 50,000 units weekly.  Dispo: Anticipated discharge in approximately <2 day(s).   Iona Coach, MD 07/14/2022, 3:17 PM Pager: Iona Coach, MD Internal Medicine Resident, PGY-1 [facility]  Pager: [phone] After 5pm on weekdays and 1pm on weekends: On Call pager [phone]

## 2022-07-14 NOTE — Progress Notes (Signed)
Inpatient Rehab Admissions Coordinator:   Left message for pt's parents to discuss CIR recommendations.  Will continue to attempts.  Need to verify available caregiver supports before pursuing CIR.    Shann Medal, PT, DPT Admissions Coordinator [phone] 07/14/22  2:08 PM

## 2022-07-15 DIAGNOSIS — G35 Multiple sclerosis: Secondary | ICD-10-CM | POA: Diagnosis not present

## 2022-07-15 NOTE — Progress Notes (Signed)
Physical Therapy Treatment Patient Details Name: Bradley Brandt MRN: [id number] DOB: 1980/11/08 Today's Date: 07/15/2022   History of Present Illness Pt is a 42 y/o male who presents to [facility] hospital on 07/11/2022 with LE weakness and vision changes. MRI demonstrates active demyelination in the brain and at T7. PMH includes relapsing remitting MS, bipolar disorder, GAD, insomnia.    PT Comments    Pt received in supine, agreeable to therapy session and eager to progress transfers and standing tolerance. Pt with good effort but impulsive with decreased insight into deficits and safety. Pt with many posterior LOB while sitting/standing and needed dense safety cues as pt frequently letting go of support and poor trunk control needs external assist to regain midline posture. Pt needing mostly modA for transfers today and performed sitting/standing exercises with good tolerance, he reports no fatigue to very minimal fatigue (1/10 modified RPE) at end of session. Pt up in chair with alarm on for safety given cognitive deficits. Pt continues to benefit from PT services to progress toward functional mobility goals.    Recommendations for follow up therapy are one component of a multi-disciplinary discharge planning process, led by the attending physician.  Recommendations may be updated based on patient status, additional functional criteria and insurance authorization.  Follow Up Recommendations  Acute inpatient rehab (3hours/day)     Assistance Recommended at Discharge Frequent or constant Supervision/Assistance  Patient can return home with the following Two people to help with walking and/or transfers;Assistance with cooking/housework;Assist for transportation;Help with stairs or ramp for entrance   Equipment Recommendations  Wheelchair (measurements PT)    Recommendations for Other Services Rehab consult     Precautions / Restrictions Precautions Precautions: Fall Precaution Comments:  Posterior lean/extensor tone Restrictions Weight Bearing Restrictions: No     Mobility  Bed Mobility Overal bed mobility: Needs Assistance Bed Mobility: Supine to Sit     Supine to sit: Min assist, HOB elevated     General bed mobility comments: Pt requires guarding and verbal cues for sequencing to get sitting EOB, pt with multiple posterior LOB and needs consistent min guard to minA for trunk support even when at foot flat. Pt impulsive to let go of rails and will fall back if not supported/cued to hold on to bed.    Transfers Overall transfer level: Needs assistance Equipment used: Rolling walker (2 wheels) Transfers: Sit to/from Stand Sit to Stand: Mod assist, Min assist           General transfer comment: Mod A with heavy multi modal cueing for transitioning from sitting to standing, From [place] he is able to stand with minA at times but impulsive to let go of Stedy railing so needs intermittent modA to remain standing and when weight shifting. Transfer via Lift Equipment: Stedy  Ambulation/Gait Ambulation/Gait assistance: Mod assist           Pre-gait activities: standing hip flexion in Stedy x10 reps x2 sets (posterior lean so defer RW until +2 support available)     Stairs             Wheelchair Mobility    Modified Rankin (Stroke Patients Only)       Balance Overall balance assessment: Needs assistance Sitting-balance support: Bilateral upper extremity supported, Single extremity supported, No upper extremity supported, Feet supported Sitting balance-Leahy Scale: Poor Sitting balance - Comments: Pt requires frequent support at trunk due to posterior lean, pt impulsive to let go of bed rails. Postural control: Posterior lean Standing balance  support: Reliant on assistive device for balance, During functional activity, Single extremity supported, Bilateral upper extremity supported Standing balance-Leahy Scale: Poor Standing balance comment: +1  min/modA static standing in Stedy lift, but with dynamic tasks or when pt only using single UE support, needs modA. Dense safety cues needed.                            Cognition Arousal/Alertness: Awake/alert Behavior During Therapy: Impulsive Overall Cognitive Status: Impaired/Different from baseline Area of Impairment: Awareness, Safety/judgement                         Safety/Judgement: Decreased awareness of safety, Decreased awareness of deficits Awareness: Emergent   General Comments: Pt with some slow processing and impulsive to let go of Stedy/bed rails causing frequent posterior LOB; mod safety/sequencing cues needed. Pt reports he is not close with his family members and that his girlfriend usually assists him, but has not been visting him today.        Exercises Other Exercises Other Exercises: seated BLE AROM: LAQ, hip flexion x10 reps ea Other Exercises: standing BLE AROM: hip flexion/weight shifting x10 reps ea x2 sets in [place] Other Exercises: STS x 5 reps reciprocal from chair<>Stedy with BUE support    General Comments General comments (skin integrity, edema, etc.): VSS per chart review, no acute s/sx distress      Pertinent Vitals/Pain Pain Assessment Pain Assessment: Faces Faces Pain Scale: Hurts a little bit Pain Location: BLE Pain Descriptors / Indicators: Sore Pain Intervention(s): Monitored during session, Repositioned     PT Goals (current goals can now be found in the care plan section) Acute Rehab PT Goals Patient Stated Goal: to return to ambulation PT Goal Formulation: With patient Time For Goal Achievement: 07/26/22 Progress towards PT goals: Progressing toward goals    Frequency    Min 3X/week      PT Plan Current plan remains appropriate       AM-PAC PT "6 Clicks" Mobility   Outcome Measure  Help needed turning from your back to your side while in a flat bed without using bedrails?: A Little Help needed  moving from lying on your back to sitting on the side of a flat bed without using bedrails?: A Lot Help needed moving to and from a bed to a chair (including a wheelchair)?: A Lot Help needed standing up from a chair using your arms (e.g., wheelchair or bedside chair)?: A Lot Help needed to walk in hospital room?: Total Help needed climbing 3-5 steps with a railing? : Total 6 Click Score: 11    End of Session Equipment Utilized During Treatment: Gait belt Activity Tolerance: Patient tolerated treatment well Patient left: in chair;with call bell/phone within reach;with chair alarm set Nurse Communication: Mobility status;Need for lift equipment;Precautions (pt impulsive, also condom cath was not attached when PTA arrived to the room) PT Visit Diagnosis: Other abnormalities of gait and mobility (R26.89);Muscle weakness (generalized) (M62.81);Other symptoms and signs involving the nervous system (R29.898)     Time: 3244-0102 PT Time Calculation (min) (ACUTE ONLY): 19 min  Charges:  $Therapeutic Exercise: 8-22 mins                     Orlando Devereux P., PTA Acute Rehabilitation Services Secure Chat Preferred 9a-5:30pm Office: [place] 07/15/2022, 5:49 PM

## 2022-07-15 NOTE — Progress Notes (Signed)
   Subjective:   Feels like he's doing better today. Legs are stronger, can move the L leg better. Vision changes--"messed up blurry"--is new. Getting better with steroids along with his legs. Didn't get out bed yesterday. Says that he would like glasses, denies having any at home. Objective:  Vital signs in last 24 hours: Vitals:   07/14/22 1550 07/14/22 2023 07/15/22 0456 07/15/22 0815  BP: (!) 144/73 133/72 121/62 125/64  Pulse: 78 70 66 (!) 47  Resp: 16 18 16 17   Temp: 98 F (36.7 C) 97.8 F (36.6 C) 98.3 F (36.8 C) 97.6 F (36.4 C)  TempSrc: Oral Oral Oral Oral  SpO2: 99% 98% 97% 100%  Weight:      Height:      Physical exam grossly unchanged since prior Constitutional: Resting in bed, no acute distress, very somnolent HENT: normocephalic atraumatic, L posterior cervical/occipital 1 cm firm mobile mass Cardiovascular: regular rate and rhythm, no m/r/g Pulmonary/Chest: normal work of breathing on room air, lungs clear to auscultation bilaterally Abdominal: soft, non-tender, non-distended MSK: normal bulk and tone Neurological: A&O, attentive, CN 2-12 grossly intact, 5/5 bilateral UE and LLE strength, 4/5RLE strength at the hip improving   Assessment/Plan:  Principal Problem:   Multiple sclerosis exacerbation ([facility])  Bradley Brandt is a 42 y.o. person living with a history of relapsing remitting MS, bipolar disorder, GAD, insomnia presenting with concerns of leg weakness and vision changes.  Patient admitted for further  management of MS flareup and pending rehab discharge plan.   #Flareup of relapsing remitting MS Still not up and out of bed by himself. PT evaluated yesterday and required 2 person assist for dynamic oob functional activities. LE strength improving now 4/5 at the RLE. Still pending CIR admission decision, there is concern he will not have appropriate support after discharge. CIR attempting to reach patients sister. TOC contacted in the mean time to start SNF  search process.Otherwise medically stable for discharge and will need ongoing rehab/therapy outside of the hospital. -Neurology following -1 g Solu-Medrol daily for (4/5 days) -hold home Dalfampridine 10 mg every 12 hours(not on formulary) -Hold home vitamin D weekly -continue home baclofen 20 mg 3 times daily -continue home gabapentin 800 mg bid -JCV antibody pending -He will need an appointment for his Tysabri infusion at [facility] infusion for the day after his discharge and a reminder card     Bipolar 1 disorder -Continue Seroquel 200mg  qhs   GAD -home gabapentin 800 mg BID  Vitamin D Deficiency  -Hold home supplementation of 50,000 units weekly.  Dispo: Anticipated discharge in approximately <2 day(s).   Iona Coach, MD 07/15/2022, 12:56 PM Pager: Iona Coach, MD Internal Medicine Resident, PGY-1 [facility]  Pager: [phone] After 5pm on weekdays and 1pm on weekends: On Call pager [phone]

## 2022-07-16 DIAGNOSIS — G35 Multiple sclerosis: Secondary | ICD-10-CM | POA: Diagnosis not present

## 2022-07-16 LAB — BASIC METABOLIC PANEL
Anion gap: 10 (ref 5–15)
BUN: 21 mg/dL — ABNORMAL HIGH (ref 6–20)
CO2: 24 mmol/L (ref 22–32)
Calcium: 9.3 mg/dL (ref 8.9–10.3)
Chloride: 104 mmol/L (ref 98–111)
Creatinine, Ser: 0.96 mg/dL (ref 0.61–1.24)
GFR, Estimated: >60 mL/min (ref >60)
Glucose, Bld: 125 mg/dL — ABNORMAL HIGH (ref 70–99)
Potassium: 4.1 mmol/L (ref 3.5–5.1)
Sodium: 138 mmol/L (ref 135–145)

## 2022-07-16 LAB — CBC
HCT: 39.5 % (ref 39.0–52.0)
Hemoglobin: 13.9 g/dL (ref 13.0–17.0)
MCH: 32.6 pg (ref 26.0–34.0)
MCHC: 35.2 g/dL (ref 30.0–36.0)
MCV: 92.5 fL (ref 80.0–100.0)
Platelets: 195 10*3/uL (ref 150–400)
RBC: 4.27 MIL/uL (ref 4.22–5.81)
RDW: 13.8 % (ref 11.5–15.5)
WBC: 12.3 10*3/uL — ABNORMAL HIGH (ref 4.0–10.5)
nRBC: 0 % (ref 0.0–0.2)

## 2022-07-16 NOTE — Discharge Summary (Signed)
Name: Bradley Brandt MRN: [id number] DOB: 10/08/1980 42 y.o. PCP: Orion Crook I, NP  Date of Admission: 07/11/2022  3:54 PM Date of Discharge:  07/16/2022 Attending Physician: Dr.  Ninetta Lights  DISCHARGE DIAGNOSIS:  Primary Problem: Multiple sclerosis exacerbation [facility])   Hospital Problems: Principal Problem:   Multiple sclerosis exacerbation (HCC)    DISCHARGE MEDICATIONS:   Allergies as of 07/16/2022       Reactions   Lithium Rash        Medication List     TAKE these medications    acetaminophen 500 MG tablet Commonly known as: TYLENOL Take 500 mg by mouth every 8 (eight) hours as needed for moderate pain.   baclofen 20 MG tablet Commonly known as: LIORESAL Take 1 tablet (20 mg total) by mouth 3 (three) times daily.   DULoxetine 30 MG capsule Commonly known as: CYMBALTA Take 30 mg by mouth daily.   gabapentin 800 MG tablet Commonly known as: Neurontin Take 1 tablet (800 mg total) by mouth 2 (two) times daily.   lidocaine 4 % Commonly known as: HM Lidocaine Patch Place 1 patch onto the skin daily.   natalizumab 300 MG/15ML injection Commonly known as: TYSABRI Inject 15 mLs (300 mg total) into the vein every 28 (twenty-eight) days.   QUEtiapine 200 MG tablet Commonly known as: SEROQUEL Take 1 tablet (200 mg total) by mouth at bedtime. Take a half tablet at night for 4 days then increase to a whole tablet each night. What changed: additional instructions   Vitamin D (Ergocalciferol) 1.25 MG (50000 UNIT) Caps capsule Commonly known as: DRISDOL Take 1 capsule (50,000 Units total) by mouth every 7 (seven) days.               Durable Medical Equipment  (From admission, onward)           Start     Ordered   07/16/22 1755  DME standard manual wheelchair with seat cushion  Once       Comments: Patient suffers from multiple sclerosis which impairs their ability to perform daily activities like bathing and toileting in the home.  A walker  will not resolve issue with performing activities of daily living. A wheelchair will allow patient to safely perform daily activities. Patient can safely propel the wheelchair in the home or has a caregiver who can provide assistance. Length of need Lifetime. Accessories: elevating leg rests (ELRs), wheel locks, extensions and anti-tippers.   07/16/22 1759            DISPOSITION AND FOLLOW-UP:  BradleyDwon K Brandt was discharged from [facility] in [place] condition. At the hospital follow up visit please address:  Follow-up Recommendations: Consults: None Labs: CBC Studies: None Medications: No medication changes at time of discharge.  Follow-up Appointments:  Follow-up Information     [facility]. Schedule an appointment as soon as possible for a visit.   Specialty: Internal Medicine Contact information: [address] [place] [address]                HOSPITAL COURSE:  Patient Summary: #Relapsing-remitting MS with flare Patient presented with 3-day history of leg weakness and acute vision changes. MRI brain without contrast showed extensive T2/FLAIR signal abnormality involving the cerebral white matter consistent with provided history of multiple sclerosis, overall progressed as compared to most recent MRI from 06/04/2021; associated prominent diffusion signal abnormality about many of these lesions; underlying advanced cerebral  atrophy for age. MRI brain with contrast was consistent with multiple sclerosis, with several new lesions demonstrating associated enhancement, compatible with active demyelination. MRI cervical spine without contrast showed patchy cord signal abnormality extending from the cervicomedullary junction to approximately C5-6, with no obvious new lesions or evidence for active demyelination; multilevel cervical spondylosis with resultant mild spinal stenosis at C5-6 with associated moderate  left C4 and C6 foraminal stenosis, with mild to moderate left C7 foraminal narrowing. MRI thoracic spine with and without contrast showed patchy cord signal abnormality at the level of T5 and T7-8 consistent with demyelinating disease/multiple sclerosis and the lesion at T7 demonstrated enhancement, consistent with active demyelination; there was no significant disc pathology or stenosis within the thoracic spine. Neurology was consulted and started IV solumedrol 1000 mg daily for 5 day course. Infectious etiologies and other acute intracranial pathologies were ruled out as causes of his presenting symptoms. He had gradual improvement of vision and weakness with 5-day course of solumedrol. At time of discharge he remains below baseline but desires to go home with home health physical and occupational therapy rather than discharge to SNF. At time of discharge, JCV antibody is pending. I have placed an order for DME wheelchair as part of discharge orders.   #L neck mass Firm, mobile mass noted on L side of neck in posterior cervical/occipital lymph node area. Ultrasound was obtained and showed sebaceous cyst versus similar subdermal lesion with no follow-up recommended.  #Hemoglobinuria #Pyuria UA was positive for hemoglobin with 21-50 RBC. He denied symptoms of dysuria, increased urinary frequency, back pain. He was not treated for UTI given absence of symptoms.   #Bipolar 1 disorder Home Seroquel 200 mg daily at bedtime was continued.    #GAD Home gabapentin 800 mg BID   #Substance use disorder Patient denied illicit substance use.    #Vitamin D Deficiency  Home vitamin D supplement was held during admission.   DISCHARGE INSTRUCTIONS:   Discharge Instructions     Discharge instructions   Complete by: As directed    Mr. Carreon,  It was a pleasure to care for you during your stay at [facility] were admitted to the hospital due to a flare of your multiple sclerosis. Like we talked  about, by going home you will not be able to go directly to a rehab facility. You have expressed wishes to have home health physical therapy at this time.   It is very important that you call [facility] at [phone] tomorrow morning to schedule your next infusion of Tysabri. You will be at risk for further flares if you do not resume your recommended treatment.  My best, Dr. Marlou Sa   Increase activity slowly   Complete by: As directed        SUBJECTIVE:  Patient is eager to discharge home today. He has been cleared by neurology. Patient has capacity and is able to explain the risks of going home (falls, getting hurt, decreased safety) rather than remaining admitted to the hospital until he is able to go to SNF. He understands that by leaving the hospital he will not be able to go to SNF. He is requesting home health PT. He lives in a single-story home that has a wheelchair-accessible ramp. He understands that he is responsible for contacting [facility] first thing tomorrow to schedule his next infusion of Tysabri and that not adhering to recommended medical therapies significantly increases his risk of rehospitalization.   Discharge Vitals:  BP (!) 136/95 (BP Location: Right Arm)   Pulse (!) 56   Temp 98.5 F (36.9 C) (Oral)   Resp 17   Ht 5\' 11"  (1.803 m)   Wt 76 kg   SpO2 98%   BMI 23.37 kg/m   OBJECTIVE:  Constitutional:Appears stated age, in no acute distress. Cardio:Regular rate and rhythm.  Pulm:Normal work of breathing on room air. [id number] for extremity edema. Skin:Warm and dry. Neuro:Alert and oriented x3. LLE strength 5/5, RLE strength 4/5. Subjective blurred vision. Psych:Pleasant mood and affect.   Pertinent Labs, Studies, and Procedures:     Latest Ref Rng & Units 07/16/2022    2:19 AM 07/11/2022    5:05 PM 06/27/2022    3:54 PM  CBC  WBC 4.0 - 10.5 K/uL 12.3  7.0  5.0   Hemoglobin 13.0 - 17.0 g/dL 13.9  15.1   17.1   Hematocrit 39.0 - 52.0 % 39.5  45.3  50.4   Platelets 150 - 400 K/uL 195  198  183        Latest Ref Rng & Units 07/16/2022    2:19 AM 07/11/2022    5:05 PM 12/12/2021    2:26 PM  CMP  Glucose 70 - 99 mg/dL 125  86  87   BUN 6 - 20 mg/dL 21  17  24    Creatinine 0.61 - 1.24 mg/dL 0.96  1.24  1.23   Sodium 135 - 145 mmol/L 138  142  143   Potassium 3.5 - 5.1 mmol/L 4.1  3.9  4.4   Chloride 98 - 111 mmol/L 104  109  105   CO2 22 - 32 mmol/L 24  21  23    Calcium 8.9 - 10.3 mg/dL 9.3  9.0  10.5   Total Protein 6.5 - 8.1 g/dL  6.8  7.4   Total Bilirubin 0.3 - 1.2 mg/dL  0.4  0.3   Alkaline Phos 38 - 126 U/L  72  100   AST 15 - 41 U/L  39  21   ALT 0 - 44 U/L  26  22     US SOFT TISSUE HEAD & NECK (NON-THYROID)  Result Date: 07/14/2022 CLINICAL DATA:  42 year old male with subcutaneous palpable abnormality of the left neck. EXAM: ULTRASOUND OF HEAD/NECK SOFT TISSUES TECHNIQUE: Ultrasound examination of the head and neck soft tissues was performed in the area of clinical concern. COMPARISON:  Recent brain MRI, cervical spine MRI 07/11/2022. Cervical MRI 06/04/2021. FINDINGS: An oval subdermal cyst is visible on the recent cervical spine MRI (series 8, image 13 of that exam) with no suspicious features. Furthermore, this was present on a 2022 MRI, proximally 12 mm at that time. Grayscale and color Doppler images today demonstrate the same lesion which is about 15 x 9 x 16 mm. No hypervascularity. Surrounding fibromuscular architecture appears unremarkable. IMPRESSION: 1.6 cm sebaceous cyst or similar subdermal lesion corresponding to the palpable abnormality, has been present since at least 2022. Electronically Signed   By: Genevie Ann M.D.   On: 07/14/2022 09:01   MR THORACIC SPINE W WO CONTRAST  Result Date: 07/12/2022 CLINICAL DATA:  Initial evaluation for multiple sclerosis. EXAM: MRI THORACIC WITHOUT AND WITH CONTRAST TECHNIQUE: Multiplanar and multiecho pulse sequences of the thoracic  spine were obtained without and with intravenous contrast. CONTRAST:  67mL GADAVIST GADOBUTROL 1 MMOL/ML IV SOLN COMPARISON:  None available. FINDINGS: Alignment: Physiologic with preservation of the normal thoracic kyphosis. No listhesis. Vertebrae: Vertebral body height maintained without  acute or chronic fracture. Diffuse loss of normal bone marrow signal, nonspecific but can be seen with anemia, smoking, obesity, and infiltrative/myelofibrotic marrow processes. No discrete or worrisome osseous lesions. No abnormal marrow edema or enhancement. Cord: On sagittal STIR sequence, small focus of patchy signal abnormality seen involving the central cord at the level of T5 (series 39, image 9). Additional more prominent lesion seen involving the right hemi cord at the level of T7-8 (series 39, image 9). No other convincing lesions identified. Following contrast administration. The lesion at T7 demonstrates enhancement, consistent with active demyelination (series 52, image 9). Underlying cord caliber within normal limits. Paraspinal and other soft tissues: Unremarkable. Disc levels: No significant disc pathology seen within the thoracic spine. No disc bulge or focal disc herniation. No stenosis or impingement. IMPRESSION: 1. Patchy cord signal abnormality at the level of T5 and T7-8 as above, consistent with demyelinating disease/multiple sclerosis. The lesion at T7 demonstrates enhancement, consistent with active demyelination. 2. No significant disc pathology or stenosis within the thoracic spine. Electronically Signed   By: Jeannine Boga M.D.   On: 07/12/2022 00:26   MR BRAIN W CONTRAST  Result Date: 07/11/2022 CLINICAL DATA:  Initial evaluation for cranial nerve palsies, history of multiple sclerosis. EXAM: MRI HEAD WITH CONTRAST TECHNIQUE: Multiplanar, multiecho pulse sequences of the brain and surrounding structures were obtained with intravenous contrast. CONTRAST:  42mL GADAVIST GADOBUTROL 1 MMOL/ML IV  SOLN COMPARISON:  Prior noncontrast brain MRI from earlier the same day. FINDINGS: Brain: Advanced cerebral atrophy with cerebral white matter changes related to multiple sclerosis again noted. Following contrast administration, several of these lesions demonstrate associated enhancement, compatible with active demyelination. For reference purposes, the most prominent of these foci is seen at the left frontal centrum semi ovale and measures 9 mm (series 50, image 14). Additional prominent lesion noted at the right lateral midbrain (series 49, image 78). No other pathologic enhancement. Vascular: Normal intravascular enhancement seen throughout the major intracranial vasculature. Skull and upper cervical spine: No new finding. Sinuses/Orbits: No new finding. Other: None. IMPRESSION: Findings consistent with multiple sclerosis, with several new lesions demonstrating associated enhancement, compatible with active demyelination. Electronically Signed   By: Jeannine Boga M.D.   On: 07/11/2022 23:55   DG Chest Brandt 1 View  Result Date: 07/11/2022 CLINICAL DATA:  Weakness EXAM: PORTABLE CHEST 1 VIEW COMPARISON:  01/02/2013 FINDINGS: The heart size and mediastinal contours are within normal limits. Both lungs are clear. The visualized skeletal structures are unremarkable. IMPRESSION: No active disease. Electronically Signed   By: Fidela Salisbury M.D.   On: 07/11/2022 23:41   MR CERVICAL SPINE WO CONTRAST  Result Date: 07/11/2022 CLINICAL DATA:  Initial evaluation for multiple sclerosis. EXAM: MRI CERVICAL SPINE WITHOUT CONTRAST TECHNIQUE: Multiplanar, multisequence MR imaging of the cervical spine was performed. No intravenous contrast was administered. COMPARISON:  Previous MRI from 06/04/2021. FINDINGS: Alignment: Examination moderately to severely degraded by motion artifact, limiting assessment. Straightening of the normal cervical lordosis.  No listhesis. Vertebrae: Vertebral body height maintained.  Diffuse loss of normal bone marrow signal, nonspecific but can be seen with anemia, smoking, obesity, and infiltrative/myelofibrotic marrow processes. No focal marrow replacing lesion. No abnormal marrow edema. Cord: Evaluation of the cervical spinal cord limited by motion. Previously identified patchy cord signal abnormality extending from the cervicomedullary junction to approximately C5-6, grossly similar as compared to previous exam. No obvious new lesions on this limited exam. No significant cord expansion or visible edema to suggest active demyelination. Underlying  cord caliber remains within normal limits. Posterior Fossa, vertebral arteries, paraspinal tissues: Paraspinous soft tissues demonstrate no acute finding. Small sebaceous cyst noted within the subcutaneous fat of the left posterior neck. Normal flow voids seen within the vertebral arteries bilaterally. Disc levels: C2-C3: Mild uncovertebral spurring without significant disc bulge. No canal or foraminal stenosis. C3-C4: Disc bulge with bilateral uncovertebral spurring. No spinal stenosis. Moderate left C4 foraminal narrowing. Right neural foramen remains patent. C4-C5: Degenerative intervertebral disc space narrowing with diffuse disc osteophyte complex. No significant spinal stenosis. Mild left C5 foraminal narrowing. Right neural foramina is patent. C5-C6: Degenerate intervertebral disc space narrowing with diffuse disc osteophyte complex. Flattening and partial effacement of the ventral thecal sac with resultant mild spinal stenosis. Mild right with moderate left C6 foraminal narrowing. Appearance is similar. C6-C7: Degenerative intervertebral disc space narrowing with diffuse disc osteophyte complex. No significant spinal stenosis. Mild to moderate left C7 foraminal narrowing. Right neural foramen veins patent. Appearance is similar. C7-T1:  Unremarkable. IMPRESSION: 1. Motion degraded exam. 2. Patchy cord signal abnormality extending from the  cervicomedullary junction to approximately C5-6, consistent with history of multiple sclerosis. Overall, appearance is grossly similar to most recent MRI from 06/04/2021. No obvious new lesions or evidence for active demyelination. 3. Multilevel cervical spondylosis with resultant mild spinal stenosis at C5-6. Associated moderate left C4 and C6 foraminal stenosis, with mild to moderate left C7 foraminal narrowing. Electronically Signed   By: Jeannine Boga M.D.   On: 07/11/2022 20:10   MR BRAIN WO CONTRAST  Result Date: 07/11/2022 CLINICAL DATA:  Initial evaluation for multiple sclerosis. EXAM: MRI HEAD WITHOUT CONTRAST TECHNIQUE: Multiplanar, multiecho pulse sequences of the brain and surrounding structures were obtained without intravenous contrast. COMPARISON:  Prior MRI from 06/04/2021. FINDINGS: Brain: Diffuse prominence of the CSF containing spaces, consistent with atrophy, advanced for age. Again seen are extensive patchy and confluent T2/FLAIR signal abnormality involving the periventricular, deep, and juxta cortical white matter both cerebral hemispheres. Patchy involvement of the descending white matter tracks, brainstem, and cerebellum. Overall, appearance is consistent with provided history of multiple sclerosis. These changes are progressed as compared to most recent MRI from 06/04/2021, with a few new and/or larger lesions (series 11, image 19 at the right frontal centrum semi ovale for example). Several of these lesions demonstrate prominent diffusion signal abnormality. While this could be related to T2 shine through, overall appearance is suspicious for possible active demyelination, although evaluation limited by lack of IV contrast. No evidence for acute or subacute infarct. Gray-white matter differentiation otherwise maintained. No other areas of chronic cortical infarction. No acute or chronic intracranial blood products. No mass lesion, midline shift or mass effect. No hydrocephalus  or extra-axial fluid collection. Pituitary gland and suprasellar region within normal limits. Vascular: Major intracranial vascular flow voids are maintained. Skull and upper cervical spine: Cranial junctional limits. Bone marrow signal intensity normal. No scalp soft tissue abnormality. Sinuses/Orbits: Globes orbital soft tissues within normal limits. Paranasal sinuses are largely clear. No mastoid effusion. Other: None. IMPRESSION: 1. Extensive T2/FLAIR signal abnormality involving the cerebral white matter as above, consistent with provided history of multiple sclerosis. Overall, these changes are progressed as compared to most recent MRI from 06/04/2021. Associated prominent diffusion signal abnormality about many of these lesions. While this could be related to T2 shine through, overall appearance is suspicious for possible active demyelination. Correlation with dedicated postcontrast imaging could be performed for confirmatory purposes as warranted. 2. Underlying advanced cerebral atrophy for age. Electronically Signed  By: Jeannine Boga M.D.   On: 07/11/2022 20:03     Signed: Farrel Gordon, D.O.  Internal Medicine Resident, PGY-2 [facility]  Pager: [phone]

## 2022-07-16 NOTE — Progress Notes (Signed)
Patient waiting for DME.  Discharge order in.

## 2022-07-16 NOTE — Progress Notes (Addendum)
   HD#4 SUBJECTIVE:  Patient Summary: Bradley Brandt is a 42 y.o. with a pertinent PMH of relapsing remitting MS, bipolar disorder, GAD, insomnia presenting with concerns of leg weakness and vision changes and admitted for MS flare.   Overnight Events: None  Interim History: Patient has no acute complaints. He reports ongoing blurred vision with slight improvement compared to days prior and improvement of his LE weakness. He was happy to inform me he worked with PT yesterday. He needs assistance reading the food menu to order breakfast due to blurred vision.  OBJECTIVE:  Vital Signs: Vitals:   07/15/22 1605 07/15/22 1922 07/16/22 0618 07/16/22 0752  BP: 133/61 136/64 114/61 118/60  Pulse: 64 (!) 53 61 (!) 50  Resp: 18 18 20 17   Temp:  98 F (36.7 C) 98.2 F (36.8 C) 98.2 F (36.8 C)  TempSrc:  Oral Oral Oral  SpO2: 96% 99% 96% 98%  Weight:      Height:       Supplemental O2: Room Air SpO2: 98 %  Filed Weights   07/12/22 0217  Weight: 76 kg     Intake/Output Summary (Last 24 hours) at 07/16/2022 1051 Last data filed at 07/16/2022 0500 Gross per 24 hour  Intake 940 ml  Output 1500 ml  Net -560 ml   Net IO Since Admission: 730.84 mL [07/16/22 1051]  Physical Exam: Constitutional:Appears stated age, in no acute distress. Cardio:Regular rate and rhythm.  Pulm:Normal work of breathing on room air. DTO:[id number] for extremity edema. Skin:Warm and dry. Neuro:Alert and oriented x3. LLE strength 5/5, RLE strength 4/5. Subjective blurred vision. Psych:Pleasant mood and affect.  Patient Lines/Drains/Airways Status     Active Line/Drains/Airways     Name Placement date Placement time Site Days   Peripheral IV 07/15/22 20 G 1" Anterior;Right Forearm 07/15/22  0757  Forearm  1             ASSESSMENT/PLAN:  Assessment: Principal Problem:   Multiple sclerosis exacerbation ([place])   Plan: Flareup of relapsing remitting MS Subjective daily improvements of LE  weakness and blurred vision though RLE strength remains 4/5. He does have mild leukocytosis to 12.3 which is not unexpected given course of high-dose steroids. Today is final day of IV solumedrol. JCV antibody test pending. He is medically stable for discharge pending final decision regarding CIR vs SNF vs home.  -Neurology following, appreciate their assistance -IV solumedrol 1000 mg, day 5/5 -Continue to hold home dalfampridine, vitamin D -Continue home baclofen 20 mg TID -Continue home gabapentin 800 mg bid -He will need an appointment for his Tysabri infusion at [facility] for day after discharge and a reminder card    Bipolar 1 disorder -Continue Seroquel 200mg  daily at bedtime.   GAD -Continue home gabapentin 800 mg BID.   Vitamin D Deficiency  -Continue to hold home supplementation of 50,000 units weekly.  Best Practice: Diet: Regular diet IVF: None VTE: enoxaparin (LOVENOX) injection 40 mg Start: 07/12/22 0800 Code: Full AB: None Therapy Recs: CIR DISPO: Anticipated discharge pending  decision regarding CIR vs SNF vs home .  Signature: Farrel Gordon, D.O.  Internal Medicine Resident, PGY-2 [facility]  Pager: [phone]   Please contact the on call pager after 5 pm and on weekends at [phone].

## 2022-07-16 NOTE — Progress Notes (Signed)
Patient became agitated around 15:00, today.  He is oriented to person, place, and time, but disoriented to situation.  He is very weak and was unable to move himself when he slide sideways in the bed.  He removed his external male catheter.  He said that the neurologist told him he could go home today.  He is unable to perform his ADLs without assistance.  This nurse stayed in the room with him to decrease his agitation. The patient is on CIWA protocol, but no medications to address CIWA have been ordered, at this time. Provider was notified and said that she would come and speak with the patient.

## 2022-07-16 NOTE — Plan of Care (Signed)

## 2022-07-16 NOTE — Progress Notes (Signed)
Subjective: Today is day 5/5 of IV Solumedrol. The patient states that his motor function has improved this admission and that he feels ready for discharge.   Objective: Current vital signs: BP 118/60 (BP Location: Right Arm)   Pulse (!) 50   Temp 98.2 F (36.8 C) (Oral)   Resp 17   Ht 5\' 11"  (1.803 m)   Wt 76 kg   SpO2 98%   BMI 23.37 kg/m  Vital signs in last 24 hours: Temp:  [98 F (36.7 C)-98.2 F (36.8 C)] 98.2 F (36.8 C) (01/28 0752) Pulse Rate:  [50-64] 50 (01/28 0752) Resp:  [17-20] 17 (01/28 0752) BP: (114-136)/(60-64) 118/60 (01/28 0752) SpO2:  [96 %-99 %] 98 % (01/28 0752)  Intake/Output from previous day: 01/27 0701 - 01/28 0700 In: 1620 [P.O.:1620] Out: 1500 [Urine:1500] Intake/Output this shift: No intake/output data recorded. Nutritional status:  Diet Order             Diet regular Room service appropriate? Yes; Fluid consistency: Thin  Diet effective now                  HEENT: [place]/AT Resp: Non-labored breathing, no acute distress   Neuro: Mental Status: Awake, alert and oriented Cranial Nerves: Fixates and tracks normally. EOMI. Some facial numbness to right V1 and V2, face symmetrical, hearing intact to voice, phonation normal, shoulder shrug symmetrical. Motor: 5 out of 5 strength to upper right upper extremity proximal and distal, 4+ out of 5 strength to left upper extremity proximal and distal, 5/5 RLE, 4+/5 LLE.  Sensory: Intact to touch throughout Cerebellar: Mild ataxia with FNF bilaterally Gait: Deferred  Lab Results: Results for orders placed or performed during the hospital encounter of 07/11/22 (from the past 48 hour(s))  CBC     Status: Abnormal   Collection Time: 07/16/22  2:19 AM  Result Value Ref Range   WBC 12.3 (H) 4.0 - 10.5 K/uL   RBC 4.27 4.22 - 5.81 MIL/uL   Hemoglobin 13.9 13.0 - 17.0 g/dL   HCT 39.5 39.0 - 52.0 %   MCV 92.5 80.0 - 100.0 fL   MCH 32.6 26.0 - 34.0 pg   MCHC 35.2 30.0 - 36.0 g/dL   RDW 13.8 11.5 -  15.5 %   Platelets 195 150 - 400 K/uL   nRBC 0.0 0.0 - 0.2 %    Comment: Performed at [facility], [place] [address], [place], [place] [address]  Basic metabolic panel     Status: Abnormal   Collection Time: 07/16/22  2:19 AM  Result Value Ref Range   Sodium 138 135 - 145 mmol/L   Potassium 4.1 3.5 - 5.1 mmol/L   Chloride 104 98 - 111 mmol/L   CO2 24 22 - 32 mmol/L   Glucose, Bld 125 (H) 70 - 99 mg/dL    Comment: Glucose reference range applies only to samples taken after fasting for at least 8 hours.   BUN 21 (H) 6 - 20 mg/dL   Creatinine, Ser 0.96 0.61 - 1.24 mg/dL   Calcium 9.3 8.9 - 10.3 mg/dL   GFR, Estimated >60 >60 mL/min    Comment: (NOTE) Calculated using the CKD-EPI Creatinine Equation (2021)    Anion gap 10 5 - 15    Comment: Performed at [facility] [address] [place], [place] [address]    No results found for this or any previous visit (from the past 240 hour(s)).  Lipid Panel No results for  input(s): "CHOL", "TRIG", "HDL", "CHOLHDL", "VLDL", "LDLCALC" in the last 72 hours.  Studies/Results: No results found.  Medications: Scheduled:  baclofen  20 mg Oral TID   enoxaparin (LOVENOX) injection  40 mg Subcutaneous Q24H   gabapentin  800 mg Oral BID   QUEtiapine  200 mg Oral QHS   Continuous:  methylPREDNISolone (SOLU-MEDROL) injection 1,000 mg (07/16/22 1029)     Assessment: 42 year old male with history of relapsing remitting MS and bipolar disorder presents with a few weeks history of impaired ambulation, multiple falls and weakness worse on the left side than right side.  MRI brain and cervical/thoracic spine reveal active enhancing lesions consistent with MS exacerbation. - Exam today shows findings consistent with chronic demyelinating disease.  - MRI brain w/wo, 07/11/22: Showed several new lesions consistent with active demyelination  - MRI cervical and thoracic spine: Patchy cord signal abnormality extending from cervical medullary  junction to C5-C6 consistent with history of MS. Cord signal abnormality at T5 and T7-8 consistent with demyelinating disease with active enhancement to lesion at T7 - The patient states that his ambulation has improved with steroid treatment. He feels ready for discharge.    Impression: Exacerbation of relapsing remitting multiple sclerosis   Recommendations: 1) Today is day 5/5 of IV Solumedrol 2) Can be discharged home from a Neurology standpoint 3) Will need outpatient follow up with Dr. Felecia Shelling of [place]   LOS: 4 days   @Electronically  signed: Dr. Kerney Elbe 07/16/2022  10:57 AM

## 2022-07-16 NOTE — Progress Notes (Signed)
Assisted patient to call his sister, which there was no answer.  Bradley Brandt, "girlfriend" said that she is unable to drive at this time.  Will continue to assist the pt.  Notified Dr. Marlou Sa.

## 2022-07-16 NOTE — Progress Notes (Signed)
Attempted to contact social work for DME/discharge.  Left a message with Josie, the LSW on call for the ED.  Called all the numbers that the operator had listed for after hours LSW, but no answer. Discussed with Agricultural consultant.

## 2022-07-17 DIAGNOSIS — G35 Multiple sclerosis: Secondary | ICD-10-CM | POA: Diagnosis not present

## 2022-07-17 LAB — CBC
HCT: 40.4 % (ref 39.0–52.0)
Hemoglobin: 13.6 g/dL (ref 13.0–17.0)
MCH: 31.6 pg (ref 26.0–34.0)
MCHC: 33.7 g/dL (ref 30.0–36.0)
MCV: 94 fL (ref 80.0–100.0)
Platelets: 201 10*3/uL (ref 150–400)
RBC: 4.3 MIL/uL (ref 4.22–5.81)
RDW: 14 % (ref 11.5–15.5)
WBC: 14.4 10*3/uL — ABNORMAL HIGH (ref 4.0–10.5)
nRBC: 0.1 % (ref 0.0–0.2)

## 2022-07-17 LAB — BASIC METABOLIC PANEL
Anion gap: 10 (ref 5–15)
BUN: 20 mg/dL (ref 6–20)
CO2: 23 mmol/L (ref 22–32)
Calcium: 9 mg/dL (ref 8.9–10.3)
Chloride: 106 mmol/L (ref 98–111)
Creatinine, Ser: 1 mg/dL (ref 0.61–1.24)
GFR, Estimated: >60 mL/min (ref >60)
Glucose, Bld: 132 mg/dL — ABNORMAL HIGH (ref 70–99)
Potassium: 4.3 mmol/L (ref 3.5–5.1)
Sodium: 139 mmol/L (ref 135–145)

## 2022-07-17 NOTE — Progress Notes (Cosign Needed)
Patient needs tysabri infusions monthly for relapsing remitting MS. Can get at SNF if possible, if not will not be able to get before discharge as only given at [facility]. Can get after SNF if not able to give there.

## 2022-07-17 NOTE — Progress Notes (Addendum)
   HD#5 SUBJECTIVE:  Patient Summary: Bradley Brandt is a 42 y.o. with a pertinent PMH of relapsing remitting MS, bipolar disorder, GAD, insomnia presenting with concerns of leg weakness and vision changes and admitted for MS flare.   Overnight Events: None  Interim History: Patient has no acute complaints. Broke his phone after we talked last night. Says family won't come pick him up. He wants to leave badly, really wants to go ahead and leave for snf. Wants to walk again. Is hungry. Thinks strength is improving and ready to go get rehab.  OBJECTIVE:  Vital Signs: Vitals:   07/16/22 1601 07/16/22 1938 07/17/22 0517 07/17/22 0824  BP: (!) 136/95 122/78 119/67 102/62  Pulse: (!) 56 (!) 55 71 (!) 50  Resp: 17 18 18 18   Temp: 98.5 F (36.9 C) 98.2 F (36.8 C) 97.8 F (36.6 C) (!) 97.5 F (36.4 C)  TempSrc: Oral Oral Oral Oral  SpO2: 98% 100% 99% 96%  Weight:      Height:       Supplemental O2: Room Air SpO2: 96 %  Filed Weights   07/12/22 0217  Weight: 76 kg     Intake/Output Summary (Last 24 hours) at 07/17/2022 1228 Last data filed at 07/17/2022 0516 Gross per 24 hour  Intake 840 ml  Output 1700 ml  Net -860 ml    Net IO Since Admission: -9.16 mL [07/17/22 1228]  Physical Exam:Grossly unchanged from prior days. Constitutional:Appears stated age, in no acute distress. Cardio:Regular rate and rhythm.  Pulm:Normal work of breathing on room air. YSA:YTKZSWFU for extremity edema. Skin:Warm and dry. Neuro:Alert and oriented x3. LLE strength 5/5, RLE strength 5/5 Psych:Pleasant mood and affect.  Patient Lines/Drains/Airways Status     Active Line/Drains/Airways     Name Placement date Placement time Site Days   Peripheral IV 07/15/22 20 G 1" Anterior;Right Forearm 07/15/22  0757  Forearm  1             ASSESSMENT/PLAN:  Assessment: Principal Problem:   Multiple sclerosis exacerbation ([place])   Plan: Flareup of relapsing remitting MS Subjective and  objective improvement of LE strength, now 5/5 BLE s/p completion of 5 day course solumedrol 1/28. CIR signed off as patient does not have adequate support system for this. SNF authorization started. Medically clear for discharge, pending SNF placement. Will follow up with outpatient neurology. -Continue to hold home dalfampridine, vitamin D -Continue home baclofen 20 mg TID -Continue home gabapentin 800 mg bid -He will need an appointment for his Tysabri infusion at [facility] for day after discharge and a reminder card   Leukocytosis WBC elevated to 14.4. No evidence of infection, suspect this is demargination from solumedrol 1g x 5 days. Will continue to monitor. -daily cbc   Bipolar 1 disorder -Continue Seroquel 200mg  daily at bedtime.   GAD -Continue home gabapentin 800 mg BID.   Vitamin D Deficiency  -Continue to hold home supplementation of 50,000 units weekly.  Best Practice: Diet: Regular diet IVF: None VTE: enoxaparin (LOVENOX) injection 40 mg Start: 07/12/22 0800 Code: Full AB: None Therapy Recs: CIR DISPO: Anticipated discharge pending  decision regarding CIR vs SNF vs home .  Signature: Iona Coach, MD Internal Medicine Resident, PGY-1 [facility]  Pager: [phone]    Please contact the on call pager after 5 pm and on weekends at [phone].

## 2022-07-17 NOTE — TOC Progression Note (Signed)
Transition of Care [facility]) - Progression Note    Patient Details  Name: Bradley Brandt MRN: [id number] Date of Birth: [birth year]  Transition of Care [facility]) CM/SW [place], [place] Phone Number: 07/17/2022, 11:25 AM  Clinical Narrative:     CSW notified by CM that pt is agreeable to SNF. CSW spoke with pt's insurance and confirmed pt does have benefits. Pt has an $ 80 copay a day, and has a 30 therapy visit max. CSW faxed pt out for options, pt is limited to what is in network. Will provide bed offers with Medicare ratings when available. TOC will continue to follow for DC needs.   Expected Discharge Plan: [place] Barriers to Discharge: Continued Medical Work up, Ship broker, Barriers Unresolved (comment) (CIR reccomendation)  Expected Discharge Plan and Services       Living arrangements for the past 2 months: Single Family Home Expected Discharge Date: 07/16/22                                     Social Determinants of Health (SDOH) Interventions SDOH Screenings   Food Insecurity: No Food Insecurity (07/12/2022)  Housing: Low Risk  (07/12/2022)  Transportation Needs: No Transportation Needs (07/12/2022)  Utilities: Not At Risk (07/12/2022)  Depression (PHQ2-9): Low Risk  (10/12/2021)  Recent Concern: Depression (PHQ2-9) - Medium Risk (08/04/2021)  Tobacco Use: High Risk (07/11/2022)    Readmission Risk Interventions     No data to display

## 2022-07-17 NOTE — Progress Notes (Signed)
Inpatient Rehab Admissions Coordinator:   Unable to reach sister.  Parents continue to report no caregiver support at home and pt is agreeable to SNF for short term rehab.  Will sign off for CIR at this time.   Shann Medal, PT, DPT Admissions Coordinator [phone] 07/17/22  11:18 AM

## 2022-07-17 NOTE — Progress Notes (Signed)
Hydroxyzine (ATARAX) given for anxiety

## 2022-07-17 NOTE — TOC Initial Note (Addendum)
Transition of Care (TOC) - Initial/Assessment Note   Orders received for HHPT and wheel chair.  Spoke to patient at bedside , confirmed face sheet information.   Patient states he is from home alone and has no assistance.   Per last PT note patient is 2 person assist .   Patient consented for NCM to call his sister Bradley Brandt [phone] . NCM called and left voicemail.   NCM secure chatted PTA who is signed in to see today and attending team    Patient would be agreeable to SNF . Explained to patient [facility] team will see if his insurance has SNF benefit , if so if insurance would approve for DX and age   NCM spoke to patient's mother on speaker phone at patient's bedside. She confirms he does not have assistance at home and states "he cannot go home". Mother Bradley Brandt [phone] voices understanding that patient has to agree to SNF and insurance would need to approve.   Patient agreeable to SNF at present . TOC Team will start SNF process  Patient Details  Name: Bradley Brandt MRN: [id number] Date of Birth: [birth year]  Transition of Care [facility]) CM/SW Contact:    Marilu Favre, RN Phone Number: 07/17/2022, 8:34 AM  Clinical Narrative:                   Expected Discharge Plan: [place] Barriers to Discharge: Continued Medical Work up, Ship broker, Barriers Unresolved (comment) (CIR reccomendation)   Patient Goals and CMS Choice Patient states their goals for this hospitalization and ongoing recovery are:: to go home CMS Medicare.gov Compare Post Acute Care list provided to:: Patient Choice offered to / list presented to : Patient      Expected Discharge Plan and Services       Living arrangements for the past 2 months: Single Family Home Expected Discharge Date: 07/16/22                                    Prior Living Arrangements/Services Living arrangements for the past 2 months: Single Family Home Lives with::  Self Patient language and need for interpreter reviewed:: Yes Do you feel safe going back to the place where you live?: Yes      Need for Family Participation in Patient Care: Yes (Comment) Care giver support system in place?: Yes (comment)   Criminal Activity/Legal Involvement Pertinent to Current Situation/Hospitalization: No - Comment as needed  Activities of Daily Living Home Assistive Devices/Equipment: Walker (specify type), Cane (specify quad or straight) ADL Screening (condition at time of admission) Patient's cognitive ability adequate to safely complete daily activities?: Yes Is the patient deaf or have difficulty hearing?: No Does the patient have difficulty seeing, even when wearing glasses/contacts?: No Does the patient have difficulty concentrating, remembering, or making decisions?: No Patient able to express need for assistance with ADLs?: Yes Does the patient have difficulty dressing or bathing?: No Independently performs ADLs?: Yes (appropriate for developmental age) Does the patient have difficulty walking or climbing stairs?: Yes Weakness of Legs: Both Weakness of Arms/Hands: None  Permission Sought/Granted   Permission granted to share information with : Yes, Verbal Permission Granted  Share Information with NAME: sister Bradley Brandt [phone]           Emotional Assessment Appearance:: Appears stated age Attitude/Demeanor/Rapport: Engaged Affect (typically observed): Accepting Orientation: :  Oriented to Self, Oriented to Place, Oriented to  Time, Oriented to Situation Alcohol / Substance Use: Not Applicable Psych Involvement: No (comment)  Admission diagnosis:  Weakness [R53.1] Multiple sclerosis exacerbation (HCC) [G35] Patient Active Problem List   Diagnosis Date Noted   Multiple sclerosis exacerbation ([place]) 07/12/2022   Marijuana use 11/22/2020   History of marijuana use 11/22/2020   Chronic pain syndrome 11/21/2020   Pharmacologic therapy  11/21/2020   Disorder of skeletal system 11/21/2020   Problems influencing health status 11/21/2020   MS (multiple sclerosis) ([place])    Neuromuscular disorder (HCC)    Bipolar disorder in full remission ([place]) 11/16/2020   Other insomnia 08/11/2020   Generalized anxiety disorder 08/11/2020   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[place]) 12/31/2017   Optic neuritis 08/02/2017   High risk medication use 08/02/2017   Vitamin D deficiency 09/19/2016   Hypercholesterolemia 09/19/2016   Numbness 12/16/2015   Spastic gait 12/16/2015   Urinary hesitancy 12/16/2015   Low back pain with sciatica 09/28/2014   Polysubstance dependence ([place]) 02/09/2014   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 [place] [address] Phone: [phone] Fax: [phone]  [id number] - [place], [place] [address] [place] [address] Phone: [phone] Fax: [phone]     Social Determinants of Health (SDOH) Social History: SDOH Screenings   Food Insecurity: No Food Insecurity (07/12/2022)  Housing: Low Risk  (07/12/2022)  Transportation Needs: No Transportation Needs (07/12/2022)  Utilities: Not At Risk (07/12/2022)  Depression (PHQ2-9): Low Risk  (10/12/2021)  Recent Concern: Depression (PHQ2-9) - Medium Risk (08/04/2021)  Tobacco Use: High Risk (07/11/2022)   SDOH  Interventions:     Readmission Risk Interventions     No data to display

## 2022-07-17 NOTE — Plan of Care (Signed)
  Problem: Health Behavior/Discharge Planning: Goal: Ability to manage health-related needs will improve Outcome: Progressing   Problem: Clinical Measurements: Goal: Will remain free from infection Outcome: Progressing   Problem: Clinical Measurements: Goal: Respiratory complications will improve Outcome: Progressing   Problem: Coping: Goal: Level of anxiety will decrease Outcome: Progressing   Problem: Pain Managment: Goal: General experience of comfort will improve Outcome: Progressing

## 2022-07-17 NOTE — Progress Notes (Addendum)
Occupational Therapy Treatment Patient Details Name: Bradley Brandt MRN: [id number] DOB: 09-06-1980 Today's Date: 07/17/2022   History of present illness Pt is a 42 y/o male who presents to [facility] hospital on 07/11/2022 with LE weakness and vision changes. MRI demonstrates active demyelination in the brain and at T7. PMH includes relapsing remitting MS, bipolar disorder, GAD, insomnia.   OT comments  Pt still needing overall max assist for dynamic sitting balance and standing balance secondary to increased posterior and left lean.  Decreased awareness also noted as well as some impulsivity as he would attempt to stand prior to therapist being beside of him when working on LB bathing tasks.  Feel he will benefit from further OT to help increased ADL independence.  Recommend SNF for follow-up therapy as pt would not be safe to return home alone at his current level of care.    Recommendations for follow up therapy are one component of a multi-disciplinary discharge planning process, led by the attending physician.  Recommendations may be updated based on patient status, additional functional criteria and insurance authorization.    Follow Up Recommendations  Skilled nursing-short term rehab (<3 hours/day)     Assistance Recommended at Discharge Frequent or constant Supervision/Assistance  Patient can return home with the following  A lot of help with walking and/or transfers;Help with stairs or ramp for entrance;Assistance with cooking/housework;Assist for transportation;A lot of help with bathing/dressing/bathroom   Equipment Recommendations  Other (comment) (TBD next venue of care)       Precautions / Restrictions Precautions Precautions: Fall Precaution Comments: severe left lean and posterior lean Restrictions Weight Bearing Restrictions: No       Mobility Bed Mobility                    Transfers Overall transfer level: Needs assistance Equipment used: Rolling walker (2  wheels) Transfers: Sit to/from Stand Sit to Stand: Max assist           General transfer comment: Pt with severe posterior and left lean in sitting and in standing.  Decreased ability to actively correct without therapist assist.     Balance Overall balance assessment: Needs assistance Sitting-balance support: Bilateral upper extremity supported, Single extremity supported, No upper extremity supported, Feet supported Sitting balance-Leahy Scale: Poor Sitting balance - Comments: Posterior and left LOB Postural control: Posterior lean Standing balance support: Reliant on assistive device for balance, During functional activity, Single extremity supported, Bilateral upper extremity supported Standing balance-Leahy Scale: Zero Standing balance comment: Severe posterior and left LOB even when standing with use of the RW for support.                           ADL either performed or assessed with clinical judgement   ADL Overall ADL's : Needs assistance/impaired     Grooming: Wash/dry face;Wash/dry hands;Minimal assistance;Sitting Grooming Details (indicate cue type and reason): Edge of chair unsupported Upper Body Bathing: Sitting;Maximal assistance (edge of chair unsupported)   Lower Body Bathing: Sit to/from stand;Maximal assistance Lower Body Bathing Details (indicate cue type and reason): Mod assist for standing balance with use of the RW.  Pt unable to release walker to wash without significant left and posterior LOB.  Had him hold onto the walker and therapist assisted with washing back peri area. Upper Body Dressing : Minimal assistance;Sitting Upper Body Dressing Details (indicate cue type and reason): gown Lower Body Dressing: Sitting/lateral leans Lower Body Dressing Details (indicate  cue type and reason): mod assist for gripper socks             Functional mobility during ADLs: Maximal assistance General ADL Comments: Pt with increased LOB to the left and  posteriorly when sitting EOC for bathing tasks.  Max assist to maintain balance.  This was the same with standing.      Cognition Arousal/Alertness: Awake/alert Behavior During Therapy: Impulsive Overall Cognitive Status: Impaired/Different from baseline Area of Impairment: Orientation, Safety/judgement, Awareness, Problem solving                 Orientation Level: Situation, Time       Safety/Judgement: Decreased awareness of safety, Decreased awareness of deficits Awareness: Emergent Problem Solving: Requires verbal cues General Comments: Pt impulsively trying to stand before therapist was ready to assist.  Increased posterior and left lean with pt exhibiting decreased awareness at times and needing mod to max assist to correct.              General Comments no acute s/sx distress, VSS per chart review    Pertinent Vitals/ Pain       Pain Assessment Pain Assessment: No/denies pain Faces Pain Scale: No hurt         Frequency  Min 2X/week        Progress Toward Goals  OT Goals(current goals can now be found in the care plan section)  Progress towards OT goals: Progressing toward goals  Acute Rehab OT Goals Patient Stated Goal: Pt wants to go to rehab so he can get another wheelchair and get some strength back. OT Goal Formulation: With patient Time For Goal Achievement: 07/31/22 Potential to Achieve Goals: Good ADL Goals Pt Will Perform Grooming: with supervision;sitting (two tasks EOB unsupported) Pt Will Perform Lower Body Bathing: with mod assist;sit to/from stand Pt Will Perform Lower Body Dressing: with mod assist;sit to/from stand Pt Will Transfer to Toilet: with min assist;bedside commode;stand pivot transfer Pt/caregiver will Perform Home Exercise Program: Increased strength;With written HEP provided;With Supervision;Both right and left upper extremity Additional ADL Goal #1: Pt will maintain static sitting balance EOB with min guard assist for 3  mins with BUE support in preparation for selfcare tasks sit to stand.  Plan Discharge plan needs to be updated;Frequency remains appropriate       AM-PAC OT "6 Clicks" Daily Activity     Outcome Measure   Help from another person eating meals?: None Help from another person taking care of personal grooming?: A Little Help from another person toileting, which includes using toliet, bedpan, or urinal?: A Lot Help from another person bathing (including washing, rinsing, drying)?: A Lot Help from another person to put on and taking off regular upper body clothing?: A Little Help from another person to put on and taking off regular lower body clothing?: A Lot 6 Click Score: 16    End of Session Equipment Utilized During Treatment: Gait belt;Rolling walker (2 wheels)  OT Visit Diagnosis: Muscle weakness (generalized) (M62.81);Low vision, both eyes (H54.2);Repeated falls (R29.6);Other symptoms and signs involving cognitive function   Activity Tolerance Patient tolerated treatment well   Patient Left in chair;with call bell/phone within reach;with chair alarm set   Nurse Communication Mobility status        Time: 1234-1310 OT Time Calculation (min): 36 min  Charges: OT General Charges $OT Visit: 1 Visit OT Treatments $Self Care/Home Management : 23-37 mins  Amor Packard OTR/L 07/17/2022, 2:31 PM

## 2022-07-17 NOTE — Progress Notes (Signed)
Physical Therapy Treatment Patient Details Name: Bradley Brandt MRN: [id number] DOB: 01-12-1981 Today's Date: 07/17/2022   History of Present Illness Pt is a 42 y/o male who presents to [facility] hospital on 07/11/2022 with LE weakness and vision changes. MRI demonstrates active demyelination in the brain and at T7. PMH includes relapsing remitting MS, bipolar disorder, GAD, insomnia.    PT Comments    Pt received in supine, attempting to eat lunch with HOB lower than 20 degrees, with tray table over his head, pt agreeable to therapy session with emphasis on seated/standing balance, safe use of assistive device, fall risk prevention, and orientation to situation/need for further therapies. Pt needing up to +2 maxA for safety and physical assist for sit<>stand transfers and pivotal steps from bed>chair ~76ft with RW support. Pt with poor motor control and poor awareness of deficits, impulsive to attempt standing/transfers without assist, but would fall backward and to his L side when therapists lessened support on gait belt/pt hips and UE. Pt unable to manage RW on his own and frequently picks it up. Pt agreeable to SNF as per chart review AIR may not be available to him and would benefit from low to moderate intensity post-acute rehab. Chair alarm on for safety as pt in recliner at end of session and had difficulty recalling need to use call bell when PTA prompted him. Pt continues to benefit from PT services to progress toward functional mobility goals.   Recommendations for follow up therapy are one component of a multi-disciplinary discharge planning process, led by the attending physician.  Recommendations may be updated based on patient status, additional functional criteria and insurance authorization.  Follow Up Recommendations  Acute inpatient rehab (3hours/day) (consider lower intensity post-acute therapies if pt does not have enough family support for AIR.)     Assistance Recommended at Discharge  Frequent or constant Supervision/Assistance  Patient can return home with the following Two people to help with walking and/or transfers;Assistance with cooking/housework;Assist for transportation;Help with stairs or ramp for entrance;Direct supervision/assist for medications management;A lot of help with bathing/dressing/bathroom   Equipment Recommendations  Wheelchair (measurements PT)    Recommendations for Other Services Rehab consult     Precautions / Restrictions Precautions Precautions: Fall Precaution Comments: Posterior lean/extensor tone Restrictions Weight Bearing Restrictions: No     Mobility  Bed Mobility Overal bed mobility: Needs Assistance Bed Mobility: Supine to Sit     Supine to sit: Min guard, HOB elevated     General bed mobility comments: Pt requires guarding and verbal cues for sequencing to get sitting EOB, pt with posterior LOB and needs consistent min guard to minA for trunk support even when at foot flat, pt propping onto his R elbow at times when trunk fatigues and needs min guard for safety. Pt using rails/HOB elevated partially when sitting up, anticipate he would need more assist from flat bed without rails.    Transfers Overall transfer level: Needs assistance Equipment used: Rolling walker (2 wheels) Transfers: Sit to/from Stand Sit to Stand: Mod assist, Max assist, +2 physical assistance   Step pivot transfers: Max assist, +2 physical assistance       General transfer comment: Mod A to maxA +2 with heavy multi modal cueing for transitioning from sitting to standing, pt pulling up on RW and needs frequent reminders to push from the bed/chair arm rests and to reach back when sitting. heavy L and posterior lean with all standing tasks and pt tending to flex B knees/lean impulsively  without warning needing constant cues/assist. Poor RW management/coordination due to poor motor control of UE and BLE    Ambulation/Gait Ambulation/Gait assistance:  Max assist, +2 physical assistance Gait Distance (Feet): 6 Feet Assistive device: Rolling walker (2 wheels) Gait Pattern/deviations: Step-to pattern, Knee flexed in stance - right, Knee flexed in stance - left, Trunk flexed, Leaning posteriorly, Shuffle       General Gait Details: Pt with poor motor control and coordination of BUE and BLE, needs assist for RW management, dense postural and step sequencing cues. Pt would have fallen to his L side without constant +2 maxA.      Balance Overall balance assessment: Needs assistance Sitting-balance support: Bilateral upper extremity supported, Single extremity supported, No upper extremity supported, Feet supported Sitting balance-Leahy Scale: Poor Sitting balance - Comments: posterior LOB needs min guard to minA, slightly improved from previous session but still with poor trunk extensor control when dual tasking Postural control: Posterior lean Standing balance support: Reliant on assistive device for balance, During functional activity, Single extremity supported, Bilateral upper extremity supported Standing balance-Leahy Scale: Zero Standing balance comment: +2 maxA for dynamic standing tasks, +1-2 maxA for static standing at RW. Pt tending to lift RW up rather than pushing down through handles for support.                            Cognition Arousal/Alertness: Awake/alert Behavior During Therapy: Impulsive, Anxious Overall Cognitive Status: Impaired/Different from baseline Area of Impairment: Awareness, Safety/judgement, Memory                     Memory: Decreased recall of precautions, Decreased short-term memory   Safety/Judgement: Decreased awareness of safety, Decreased awareness of deficits Awareness: Emergent   General Comments: Pt with slow processing and impulsive to let go of assistive device, causing frequent posterior LOB; mod safety/sequencing cues needed. No family in the room to clarify pt prior  level of function, he reports he was pivoting to wheelchair unassisted prior, but pt with noted memory deficits so not sure this is correct.        Exercises Other Exercises Other Exercises: STS x2 reps reciprocal from chair with BUE support and +2 assist    General Comments General comments (skin integrity, edema, etc.): no acute s/sx distress, VSS per chart review      Pertinent Vitals/Pain Pain Assessment Pain Assessment: No/denies pain Faces Pain Scale: No hurt Pain Intervention(s): Monitored during session, Repositioned           PT Goals (current goals can now be found in the care plan section) Acute Rehab PT Goals Patient Stated Goal: to return to ambulation PT Goal Formulation: With patient Time For Goal Achievement: 07/26/22 Progress towards PT goals: Progressing toward goals (slow progress)    Frequency    Min 3X/week      PT Plan Current plan remains appropriate       AM-PAC PT "6 Clicks" Mobility   Outcome Measure  Help needed turning from your back to your side while in a flat bed without using bedrails?: A Little Help needed moving from lying on your back to sitting on the side of a flat bed without using bedrails?: A Lot ("a lot" without rails) Help needed moving to and from a bed to a chair (including a wheelchair)?: Total (+2 maxA for standing today) Help needed standing up from a chair using your arms (e.g., wheelchair or bedside chair)?: Total  Help needed to walk in hospital room?: Total Help needed climbing 3-5 steps with a railing? : Total 6 Click Score: 9    End of Session Equipment Utilized During Treatment: Gait belt Activity Tolerance: Patient tolerated treatment well;Patient limited by fatigue Patient left: in chair;with call bell/phone within reach;with chair alarm set Nurse Communication: Mobility status;Need for lift equipment;Precautions (pt impulsive, he was trying to eat with HOB <30*, poor safety, needs +2 for safety with Stedy  or lift OOB transfers today given deficits) PT Visit Diagnosis: Other abnormalities of gait and mobility (R26.89);Muscle weakness (generalized) (M62.81);Other symptoms and signs involving the nervous system (R29.898)     Time: 2536-6440 PT Time Calculation (min) (ACUTE ONLY): 18 min  Charges:  $Therapeutic Activity: 8-22 mins                     Caralyn Twining P., PTA Acute Rehabilitation Services Secure Chat Preferred 9a-5:30pm Office: [phone]    Dorathy Kinsman [facility] 07/17/2022, 1:21 PM

## 2022-07-17 NOTE — Progress Notes (Signed)
Hydroxyzine (ATARAX) given for anxiety pro

## 2022-07-18 DIAGNOSIS — G35 Multiple sclerosis: Secondary | ICD-10-CM | POA: Diagnosis not present

## 2022-07-18 LAB — CBC
HCT: 39.9 % (ref 39.0–52.0)
Hemoglobin: 13.9 g/dL (ref 13.0–17.0)
MCH: 32.6 pg (ref 26.0–34.0)
MCHC: 34.8 g/dL (ref 30.0–36.0)
MCV: 93.4 fL (ref 80.0–100.0)
Platelets: 184 10*3/uL (ref 150–400)
RBC: 4.27 MIL/uL (ref 4.22–5.81)
RDW: 14.3 % (ref 11.5–15.5)
WBC: 11.9 10*3/uL — ABNORMAL HIGH (ref 4.0–10.5)
nRBC: 0 % (ref 0.0–0.2)

## 2022-07-18 NOTE — TOC Progression Note (Addendum)
Transition of Care [facility]) - Progression Note    Patient Details  Name: Bradley Brandt MRN: [id number] Date of Birth: [birth year]  Transition of Care [facility]) CM/SW Contact  Jacalyn Lefevre Edson Snowball, RN Phone Number: 07/18/2022, 3:56 PM  Clinical Narrative:     Spoke to patient at bedside. Patient has no SNF bed offers. Patient wanting to go home with home health.   Amy with [facility] accepted referral for HHPT/OT. Patient aware he has a co pay of $80.00 per visit .   TOC team ordered wheel chair to be delivered to patient's hospital room today. Discharge today   Patient will need wheel Lucianne Lei transport home. Will arrange through [facility] [phone] . Waiver signed by patient.  Confirmed address in EPIC. Patient has key to get in and has a ramp. NCM called [facility] and provided all information and explained nurse will call them when patient ready for pick up.   Patient consented for NCM and SW to called his mother to update.   NCM and SW called Nunzio Cory on speaker phone and explained above. Nunzio Cory voiced understanding.   Nunzio Cory has spoken to patient's sister Sharyn Lull . Sharyn Lull is aware Amadeo is Physicist, medical. She will met him at home when he arrives. She is requesting nurse call her when transport is arranged at [phone] .   NCM secure chatted nurse with safe [facility] number and Michelle's cell home number   [facility] ran insurance they are out of network, therefore cannot provide wheelchair. Call Perry with [place] , he is in network and will provide wheelchair estimated time of delivery 5:30 to 6 pm tonight   Expected Discharge Plan: [place] Barriers to Discharge: Continued Medical Work up, Ship broker, Barriers Unresolved (comment) (CIR reccomendation)  Expected Discharge Plan and Services       Living arrangements for the past 2 months: Single Family Home Expected Discharge Date: 07/16/22                                      Social Determinants of Health (SDOH) Interventions SDOH Screenings   Food Insecurity: No Food Insecurity (07/12/2022)  Housing: Low Risk  (07/12/2022)  Transportation Needs: No Transportation Needs (07/12/2022)  Utilities: Not At Risk (07/12/2022)  Depression (PHQ2-9): Low Risk  (10/12/2021)  Recent Concern: Depression (PHQ2-9) - Medium Risk (08/04/2021)  Tobacco Use: High Risk (07/11/2022)    Readmission Risk Interventions     No data to display

## 2022-07-18 NOTE — Progress Notes (Signed)
   HD#6 SUBJECTIVE:  Patient Summary: Bradley Brandt is a 42 y.o. with a pertinent PMH of relapsing remitting MS, bipolar disorder, GAD, insomnia presenting with concerns of leg weakness and vision changes and admitted for MS flare.   Overnight Events: None  Interim History: Patient says his throat hurts and has had a cough and runny nose. Says he got sick from being in the hospital needs to leave. No trouble eating or breathing. LE strength improving. Wants to get to rehab asap.  OBJECTIVE:  Vital Signs: Vitals:   07/17/22 1654 07/17/22 2049 07/18/22 0500 07/18/22 0740  BP: 123/63 129/84 (!) 99/52 108/69  Pulse: (!) 56 69 65 (!) 55  Resp: 16  16 16   Temp: 98.8 F (37.1 C) 98.6 F (37 C) 98.4 F (36.9 C) 97.8 F (36.6 C)  TempSrc: Oral Oral Oral Oral  SpO2: 100% 97% 100% 99%  Weight:      Height:       Supplemental O2: Room Air SpO2: 99 %  Filed Weights   07/12/22 0217  Weight: 76 kg     Intake/Output Summary (Last 24 hours) at 07/18/2022 1327 Last data filed at 07/18/2022 1037 Gross per 24 hour  Intake 480 ml  Output 2650 ml  Net -2170 ml    Net IO Since Admission: -2,179.16 mL [07/18/22 1327]  Physical Exam:Grossly unchanged from prior days. Constitutional:Appears stated age, in no acute distress. Cardio:Regular rate and rhythm.  Pulm:Normal work of breathing on room air. GDJ:MEQASTMH for extremity edema. Skin:Warm and dry. Neuro:Alert and oriented x3. LLE strength 5/5, RLE strength 5/5 Psych:Pleasant mood and affect.  Patient Lines/Drains/Airways Status     Active Line/Drains/Airways     Name Placement date Placement time Site Days   Peripheral IV 07/15/22 20 G 1" Anterior;Right Forearm 07/15/22  0757  Forearm  1             ASSESSMENT/PLAN:  Assessment: Principal Problem:   Multiple sclerosis exacerbation ([place]) Bradley Brandt is a 42 y.o. with a pertinent PMH of relapsing remitting MS, bipolar disorder, GAD, insomnia who is now s/p MS flare  treatment and pending SNF placement to discharge.  Plan: Flareup of relapsing remitting MS S/p 5 day course solumedrol. Not medically requiring hospitalization, pending SNF placement for rehab from [facility]. Vision improved, LE strength 5/5 bilaterally and improved.Will follow up with outpatient neurology. -Continue to hold home dalfampridine, vitamin D -Continue home baclofen 20 mg TID -Continue home gabapentin 800 mg bid -He will need an appointment for his Tysabri infusion at [facility] for day after discharge and a reminder card   Leukocytosis WBC downtrending 11.9. No evidence of infection, suspect this is demargination from solumedrol 1g x 5 days. Will continue to monitor. -daily cbc   Bipolar 1 disorder -Continue Seroquel 200mg  daily at bedtime.   GAD -Continue home gabapentin 800 mg BID.   Vitamin D Deficiency  -Continue to hold home supplementation of 50,000 units weekly.  Best Practice: Diet: Regular diet IVF: None VTE: enoxaparin (LOVENOX) injection 40 mg Start: 07/12/22 0800 Code: Full AB: None Therapy Recs: CIR DISPO: Anticipated discharge pending  decision regarding CIR vs SNF vs home .  Signature: Iona Coach, MD Internal Medicine Resident, PGY-1 [facility]  Pager: [phone]    Please contact the on call pager after 5 pm and on weekends at [phone].

## 2022-07-18 NOTE — Discharge Summary (Cosign Needed)
Name: Bradley Brandt MRN: [id number] DOB: March 11, 1981 42 y.o. PCP: Bo Merino I, NP  Date of Admission: 07/11/2022  3:54 PM Date of Discharge:  07/18/2022 6:28PM Attending Physician: Dr. Philipp Ovens  DISCHARGE DIAGNOSIS:  Primary Problem: Multiple sclerosis exacerbation [facility])   Hospital Problems: Principal Problem:   Multiple sclerosis exacerbation ([place])    DISCHARGE MEDICATIONS:   Allergies as of 07/18/2022       Reactions   Lithium Rash        Medication List     TAKE these medications    acetaminophen 500 MG tablet Commonly known as: TYLENOL Take 500 mg by mouth every 8 (eight) hours as needed for moderate pain.   baclofen 20 MG tablet Commonly known as: LIORESAL Take 1 tablet (20 mg total) by mouth 3 (three) times daily.   DULoxetine 30 MG capsule Commonly known as: CYMBALTA Take 30 mg by mouth daily.   gabapentin 800 MG tablet Commonly known as: Neurontin Take 1 tablet (800 mg total) by mouth 2 (two) times daily.   lidocaine 4 % Commonly known as: HM Lidocaine Patch Place 1 patch onto the skin daily.   natalizumab 300 MG/15ML injection Commonly known as: TYSABRI Inject 15 mLs (300 mg total) into the vein every 28 (twenty-eight) days.   QUEtiapine 200 MG tablet Commonly known as: SEROQUEL Take 1 tablet (200 mg total) by mouth at bedtime. Take a half tablet at night for 4 days then increase to a whole tablet each night. What changed: additional instructions   Vitamin D (Ergocalciferol) 1.25 MG (50000 UNIT) Caps capsule Commonly known as: DRISDOL Take 1 capsule (50,000 Units total) by mouth every 7 (seven) days.               Durable Medical Equipment  (From admission, onward)           Start     Ordered   07/16/22 1755  DME standard manual wheelchair with seat cushion  Once       Comments: Patient suffers from multiple sclerosis which impairs their ability to perform daily activities like bathing and toileting in the home.  A  walker will not resolve issue with performing activities of daily living. A wheelchair will allow patient to safely perform daily activities. Patient can safely propel the wheelchair in the home or has a caregiver who can provide assistance. Length of need Lifetime. Accessories: elevating leg rests (ELRs), wheel locks, extensions and anti-tippers.   07/16/22 1759            DISPOSITION AND FOLLOW-UP:  Bradley Brandt was discharged from [facility] in [place] condition. At the hospital follow up visit please address:  Follow-up Recommendations: Consults: None Labs: CBC, UA Studies: None Medications: No medication changes at time of discharge.  Follow-up Appointments:  Follow-up [place] at [facility]. Schedule an appointment as soon as possible for a visit.   Specialty: Internal Medicine Contact information: [place] COURSE:  Patient Summary: #Relapsing-remitting MS with flare Patient presented with 3-day history of leg weakness and acute vision changes. MRI brain without contrast showed extensive T2/FLAIR signal abnormality involving the cerebral white matter consistent with provided history of multiple sclerosis, overall progressed as compared to most recent MRI from 06/04/2021; associated prominent diffusion signal abnormality about many of these lesions; underlying advanced  cerebral atrophy for age. MRI brain with contrast was consistent with multiple sclerosis, with several new lesions demonstrating associated enhancement, compatible with active demyelination. MRI cervical spine without contrast showed patchy cord signal abnormality extending from the cervicomedullary junction to approximately C5-6, with no obvious new lesions or evidence for active demyelination; multilevel cervical spondylosis with resultant mild spinal stenosis at C5-6 with associated  moderate left C4 and C6 foraminal stenosis, with mild to moderate left C7 foraminal narrowing. MRI thoracic spine with and without contrast showed patchy cord signal abnormality at the level of T5 and T7-8 consistent with demyelinating disease/multiple sclerosis and the lesion at T7 demonstrated enhancement, consistent with active demyelination; there was no significant disc pathology or stenosis within the thoracic spine. Neurology was consulted and started IV solumedrol 1000 mg daily for 5 day course. Infectious etiologies and other acute intracranial pathologies were ruled out as causes of his presenting symptoms. He had gradual improvement of vision and weakness with 5-day course of solumedrol. At time of discharge he remains below baseline .Not a candidate for CIR. Insurance did not approve SNF. Discharged home. #L neck mass Firm, mobile mass noted on L side of neck in posterior cervical/occipital lymph node area. Ultrasound was obtained and showed sebaceous cyst versus similar subdermal lesion with no follow-up recommended.  #Microscopic hematuria #Pyuria UA was positive for hemoglobin with 21-50 RBC. He denied symptoms of dysuria, increased urinary frequency, back pain. He was not treated for UTI given absence of symptoms. Will need repeat UA outpatient.   #Bipolar 1 disorder Home Seroquel 200 mg daily at bedtime was continued.    #GAD Home gabapentin 800 mg BID   #Substance use disorder Patient denied illicit substance use.    #Vitamin D Deficiency  Home vitamin D supplement was held during admission.   DISCHARGE INSTRUCTIONS:   Discharge Instructions     Discharge instructions   Complete by: As directed    Mr. Dirico,  It was a pleasure to care for you during your stay at [facility]. You were admitted to the hospital due to a flare of your multiple sclerosis. Like we talked about, by going home you will not be able to go directly to a rehab facility. You have expressed wishes  to have home health physical therapy at this time.   It is very important that you call [facility] at [phone] tomorrow morning to schedule your next infusion of Tysabri. You will be at risk for further flares if you do not resume your recommended treatment.  My best, Dr. August Saucer   Increase activity slowly   Complete by: As directed        SUBJECTIVE:   Patient says his throat hurts and has had a cough and runny nose. Says he got sick from being in the hospital needs to leave. No trouble eating or breathing. LE strength improving. Wants to get to rehab asap.  Discharge Vitals:   BP (!) 99/52 (BP Location: Right Arm)   Pulse 65   Temp 98.4 F (36.9 C) (Oral)   Resp 16   Ht 5\' 11"  (1.803 m)   Wt 76 kg   SpO2 100%   BMI 23.37 kg/m   OBJECTIVE:  Constitutional:Appears stated age, in no acute distress. Cardio:Regular rate and rhythm.  Pulm:Normal work of breathing on room air. for extremity edema. Skin:Warm and dry. Neuro:Alert and oriented x3. LLE strength 5/5, RLE strength 5/5 Psych:Pleasant mood and affect.  Pertinent Labs, Studies, and Procedures:  Latest Ref Rng & Units 07/18/2022    3:40 AM 07/17/2022    2:17 AM 07/16/2022    2:19 AM  CBC  WBC 4.0 - 10.5 K/uL 11.9  14.4  12.3   Hemoglobin 13.0 - 17.0 g/dL 13.9  13.6  13.9   Hematocrit 39.0 - 52.0 % 39.9  40.4  39.5   Platelets 150 - 400 K/uL 184  201  195        Latest Ref Rng & Units 07/17/2022    2:17 AM 07/16/2022    2:19 AM 07/11/2022    5:05 PM  CMP  Glucose 70 - 99 mg/dL 132  125  86   BUN 6 - 20 mg/dL 20  21  17    Creatinine 0.61 - 1.24 mg/dL 1.00  0.96  1.24   Sodium 135 - 145 mmol/L 139  138  142   Potassium 3.5 - 5.1 mmol/L 4.3  4.1  3.9   Chloride 98 - 111 mmol/L 106  104  109   CO2 22 - 32 mmol/L 23  24  21    Calcium 8.9 - 10.3 mg/dL 9.0  9.3  9.0   Total Protein 6.5 - 8.1 g/dL   6.8   Total Bilirubin 0.3 - 1.2 mg/dL   0.4   Alkaline Phos 38 - 126 U/L    72   AST 15 - 41 U/L   39   ALT 0 - 44 U/L   26     US SOFT TISSUE HEAD & NECK (NON-THYROID)  Result Date: 07/14/2022 CLINICAL DATA:  42 year old male with subcutaneous palpable abnormality of the left neck. EXAM: ULTRASOUND OF HEAD/NECK SOFT TISSUES TECHNIQUE: Ultrasound examination of the head and neck soft tissues was performed in the area of clinical concern. COMPARISON:  Recent brain MRI, cervical spine MRI 07/11/2022. Cervical MRI 06/04/2021. FINDINGS: An oval subdermal cyst is visible on the recent cervical spine MRI (series 8, image 13 of that exam) with no suspicious features. Furthermore, this was present on a 2022 MRI, proximally 12 mm at that time. Grayscale and color Doppler images today demonstrate the same lesion which is about 15 x 9 x 16 mm. No hypervascularity. Surrounding fibromuscular architecture appears unremarkable. IMPRESSION: 1.6 cm sebaceous cyst or similar subdermal lesion corresponding to the palpable abnormality, has been present since at least 2022. Electronically Signed   By: Genevie Ann M.D.   On: 07/14/2022 09:01   MR THORACIC SPINE W WO CONTRAST  Result Date: 07/12/2022 CLINICAL DATA:  Initial evaluation for multiple sclerosis. EXAM: MRI THORACIC WITHOUT AND WITH CONTRAST TECHNIQUE: Multiplanar and multiecho pulse sequences of the thoracic spine were obtained without and with intravenous contrast. CONTRAST:  68mL GADAVIST GADOBUTROL 1 MMOL/ML IV SOLN COMPARISON:  None available. FINDINGS: Alignment: Physiologic with preservation of the normal thoracic kyphosis. No listhesis. Vertebrae: Vertebral body height maintained without acute or chronic fracture. Diffuse loss of normal bone marrow signal, nonspecific but can be seen with anemia, smoking, obesity, and infiltrative/myelofibrotic marrow processes. No discrete or worrisome osseous lesions. No abnormal marrow edema or enhancement. Cord: On sagittal STIR sequence, small focus of patchy signal abnormality seen involving the  central cord at the level of T5 (series 39, image 9). Additional more prominent lesion seen involving the right hemi cord at the level of T7-8 (series 39, image 9). No other convincing lesions identified. Following contrast administration. The lesion at T7 demonstrates enhancement, consistent with active demyelination (series 52, image 9). Underlying cord caliber within normal limits.  Paraspinal and other soft tissues: Unremarkable. Disc levels: No significant disc pathology seen within the thoracic spine. No disc bulge or focal disc herniation. No stenosis or impingement. IMPRESSION: 1. Patchy cord signal abnormality at the level of T5 and T7-8 as above, consistent with demyelinating disease/multiple sclerosis. The lesion at T7 demonstrates enhancement, consistent with active demyelination. 2. No significant disc pathology or stenosis within the thoracic spine. Electronically Signed   By: Rise Mu M.D.   On: 07/12/2022 00:26   MR BRAIN W CONTRAST  Result Date: 07/11/2022 CLINICAL DATA:  Initial evaluation for cranial nerve palsies, history of multiple sclerosis. EXAM: MRI HEAD WITH CONTRAST TECHNIQUE: Multiplanar, multiecho pulse sequences of the brain and surrounding structures were obtained with intravenous contrast. CONTRAST:  47mL GADAVIST GADOBUTROL 1 MMOL/ML IV SOLN COMPARISON:  Prior noncontrast brain MRI from earlier the same day. FINDINGS: Brain: Advanced cerebral atrophy with cerebral white matter changes related to multiple sclerosis again noted. Following contrast administration, several of these lesions demonstrate associated enhancement, compatible with active demyelination. For reference purposes, the most prominent of these foci is seen at the left frontal centrum semi ovale and measures 9 mm (series 50, image 14). Additional prominent lesion noted at the right lateral midbrain (series 49, image 78). No other pathologic enhancement. Vascular: Normal intravascular enhancement seen  throughout the major intracranial vasculature. Skull and upper cervical spine: No new finding. Sinuses/Orbits: No new finding. Other: None. IMPRESSION: Findings consistent with multiple sclerosis, with several new lesions demonstrating associated enhancement, compatible with active demyelination. Electronically Signed   By: Rise Mu M.D.   On: 07/11/2022 23:55   DG Chest Port 1 View  Result Date: 07/11/2022 CLINICAL DATA:  Weakness EXAM: PORTABLE CHEST 1 VIEW COMPARISON:  01/02/2013 FINDINGS: The heart size and mediastinal contours are within normal limits. Both lungs are clear. The visualized skeletal structures are unremarkable. IMPRESSION: No active disease. Electronically Signed   By: Helyn Numbers M.D.   On: 07/11/2022 23:41   MR CERVICAL SPINE WO CONTRAST  Result Date: 07/11/2022 CLINICAL DATA:  Initial evaluation for multiple sclerosis. EXAM: MRI CERVICAL SPINE WITHOUT CONTRAST TECHNIQUE: Multiplanar, multisequence MR imaging of the cervical spine was performed. No intravenous contrast was administered. COMPARISON:  Previous MRI from 06/04/2021. FINDINGS: Alignment: Examination moderately to severely degraded by motion artifact, limiting assessment. Straightening of the normal cervical lordosis.  No listhesis. Vertebrae: Vertebral body height maintained. Diffuse loss of normal bone marrow signal, nonspecific but can be seen with anemia, smoking, obesity, and infiltrative/myelofibrotic marrow processes. No focal marrow replacing lesion. No abnormal marrow edema. Cord: Evaluation of the cervical spinal cord limited by motion. Previously identified patchy cord signal abnormality extending from the cervicomedullary junction to approximately C5-6, grossly similar as compared to previous exam. No obvious new lesions on this limited exam. No significant cord expansion or visible edema to suggest active demyelination. Underlying cord caliber remains within normal limits. Posterior Fossa,  vertebral arteries, paraspinal tissues: Paraspinous soft tissues demonstrate no acute finding. Small sebaceous cyst noted within the subcutaneous fat of the left posterior neck. Normal flow voids seen within the vertebral arteries bilaterally. Disc levels: C2-C3: Mild uncovertebral spurring without significant disc bulge. No canal or foraminal stenosis. C3-C4: Disc bulge with bilateral uncovertebral spurring. No spinal stenosis. Moderate left C4 foraminal narrowing. Right neural foramen remains patent. C4-C5: Degenerative intervertebral disc space narrowing with diffuse disc osteophyte complex. No significant spinal stenosis. Mild left C5 foraminal narrowing. Right neural foramina is patent. C5-C6: Degenerate intervertebral disc space narrowing with  diffuse disc osteophyte complex. Flattening and partial effacement of the ventral thecal sac with resultant mild spinal stenosis. Mild right with moderate left C6 foraminal narrowing. Appearance is similar. C6-C7: Degenerative intervertebral disc space narrowing with diffuse disc osteophyte complex. No significant spinal stenosis. Mild to moderate left C7 foraminal narrowing. Right neural foramen veins patent. Appearance is similar. C7-T1:  Unremarkable. IMPRESSION: 1. Motion degraded exam. 2. Patchy cord signal abnormality extending from the cervicomedullary junction to approximately C5-6, consistent with history of multiple sclerosis. Overall, appearance is grossly similar to most recent MRI from 06/04/2021. No obvious new lesions or evidence for active demyelination. 3. Multilevel cervical spondylosis with resultant mild spinal stenosis at C5-6. Associated moderate left C4 and C6 foraminal stenosis, with mild to moderate left C7 foraminal narrowing. Electronically Signed   By: Jeannine Boga M.D.   On: 07/11/2022 20:10   MR BRAIN WO CONTRAST  Result Date: 07/11/2022 CLINICAL DATA:  Initial evaluation for multiple sclerosis. EXAM: MRI HEAD WITHOUT CONTRAST  TECHNIQUE: Multiplanar, multiecho pulse sequences of the brain and surrounding structures were obtained without intravenous contrast. COMPARISON:  Prior MRI from 06/04/2021. FINDINGS: Brain: Diffuse prominence of the CSF containing spaces, consistent with atrophy, advanced for age. Again seen are extensive patchy and confluent T2/FLAIR signal abnormality involving the periventricular, deep, and juxta cortical white matter both cerebral hemispheres. Patchy involvement of the descending white matter tracks, brainstem, and cerebellum. Overall, appearance is consistent with provided history of multiple sclerosis. These changes are progressed as compared to most recent MRI from 06/04/2021, with a few new and/or larger lesions (series 11, image 19 at the right frontal centrum semi ovale for example). Several of these lesions demonstrate prominent diffusion signal abnormality. While this could be related to T2 shine through, overall appearance is suspicious for possible active demyelination, although evaluation limited by lack of IV contrast. No evidence for acute or subacute infarct. Gray-white matter differentiation otherwise maintained. No other areas of chronic cortical infarction. No acute or chronic intracranial blood products. No mass lesion, midline shift or mass effect. No hydrocephalus or extra-axial fluid collection. Pituitary gland and suprasellar region within normal limits. Vascular: Major intracranial vascular flow voids are maintained. Skull and upper cervical spine: Cranial junctional limits. Bone marrow signal intensity normal. No scalp soft tissue abnormality. Sinuses/Orbits: Globes orbital soft tissues within normal limits. Paranasal sinuses are largely clear. No mastoid effusion. Other: None. IMPRESSION: 1. Extensive T2/FLAIR signal abnormality involving the cerebral white matter as above, consistent with provided history of multiple sclerosis. Overall, these changes are progressed as compared to most  recent MRI from 06/04/2021. Associated prominent diffusion signal abnormality about many of these lesions. While this could be related to T2 shine through, overall appearance is suspicious for possible active demyelination. Correlation with dedicated postcontrast imaging could be performed for confirmatory purposes as warranted. 2. Underlying advanced cerebral atrophy for age. Electronically Signed   By: Jeannine Boga M.D.   On: 07/11/2022 20:03     Signed: Iona Coach, MD Internal Medicine Resident, PGY-1 [facility]  Pager: [phone]

## 2022-07-18 NOTE — Progress Notes (Signed)
Patients soiled bed and gown changed by RN and NT. Patient in bed. Call light in reach. No more needs at this time. Bed alarm on.

## 2022-07-18 NOTE — Progress Notes (Signed)
Patient discharged to home via wheelchair by Lucianne Lei with all belongings

## 2022-07-18 NOTE — TOC Progression Note (Signed)
Transition of Care [facility]) - Progression Note    Patient Details  Name: Bradley Brandt MRN: [id number] Date of Birth: [birth year]  Transition of Care [facility]) CM/SW [place], [place] Phone Number: 07/18/2022, 4:02 PM  Clinical Narrative:    CSW was advised by potential SNF that [employer] had provided the wrong information and pt has a far more substantial co pay. All SNF's have declined due to age, or insurance concerns. Pt updated and he states he just wants to go home as soon as possible. CM to arrange DME and care services. Mother updated to new plan. CSW to sign off at this time.    Expected Discharge Plan: [place] Barriers to Discharge: Continued Medical Work up, Ship broker, Barriers Unresolved (comment) (CIR reccomendation)  Expected Discharge Plan and Services       Living arrangements for the past 2 months: Single Family Home Expected Discharge Date: 07/16/22                                     Social Determinants of Health (SDOH) Interventions SDOH Screenings   Food Insecurity: No Food Insecurity (07/12/2022)  Housing: Low Risk  (07/12/2022)  Transportation Needs: No Transportation Needs (07/12/2022)  Utilities: Not At Risk (07/12/2022)  Depression (PHQ2-9): Low Risk  (10/12/2021)  Recent Concern: Depression (PHQ2-9) - Medium Risk (08/04/2021)  Tobacco Use: High Risk (07/11/2022)    Readmission Risk Interventions     No data to display

## 2022-07-19 DIAGNOSIS — G35 Multiple sclerosis: Secondary | ICD-10-CM | POA: Diagnosis not present

## 2022-07-20 DIAGNOSIS — G35 Multiple sclerosis: Secondary | ICD-10-CM | POA: Diagnosis not present

## 2022-07-22 ENCOUNTER — Other Ambulatory Visit: Payer: Self-pay | Admitting: Nurse Practitioner

## 2022-07-22 DIAGNOSIS — G8929 Other chronic pain: Secondary | ICD-10-CM

## 2022-07-22 DIAGNOSIS — F319 Bipolar disorder, unspecified: Secondary | ICD-10-CM

## 2022-07-22 DIAGNOSIS — G894 Chronic pain syndrome: Secondary | ICD-10-CM

## 2022-07-22 MED ORDER — DULOXETINE HCL 30 MG PO CPEP: 30.0000 mg | ORAL_CAPSULE | Freq: Every day | ORAL | 0 refills | Status: DC

## 2022-07-22 MED ORDER — QUETIAPINE FUMARATE 200 MG PO TABS: 200.0000 mg | ORAL_TABLET | Freq: Every day | ORAL | 2 refills | Status: DC

## 2022-07-22 MED ORDER — BACLOFEN 20 MG PO TABS: 20.0000 mg | ORAL_TABLET | Freq: Three times a day (TID) | ORAL | 0 refills | Status: DC

## 2022-07-24 DIAGNOSIS — G35 Multiple sclerosis: Secondary | ICD-10-CM | POA: Diagnosis not present

## 2022-07-26 ENCOUNTER — Encounter (HOSPITAL_COMMUNITY): Payer: 59

## 2022-07-26 ENCOUNTER — Non-Acute Institutional Stay (HOSPITAL_COMMUNITY)
Admission: RE | Admit: 2022-07-26 | Discharge: 2022-07-26 | Disposition: A | Discharge status: Home / Self Care | Payer: 59 | Source: Ambulatory Visit | Attending: Internal Medicine | Admitting: Internal Medicine

## 2022-07-26 DIAGNOSIS — G35 Multiple sclerosis: Secondary | ICD-10-CM | POA: Insufficient documentation

## 2022-07-26 MED ORDER — SODIUM CHLORIDE 0.9 % IV SOLN
300.0000 mg | Freq: Once | INTRAVENOUS | Status: AC
  Administered 2022-07-26: 300 mg via INTRAVENOUS
  Filled 2022-07-26: qty 15

## 2022-07-26 MED ORDER — SODIUM CHLORIDE 0.9 % IV SOLN: INTRAVENOUS | Status: DC | PRN

## 2022-07-26 NOTE — Progress Notes (Signed)
PATIENT CARE CENTER NOTE  Diagnosis: MS (multiple sclerosis) ([place]) [G35]     Provider: Kae Heller MD  Procedure: Tysabri IV  Note: Patient received Tysabri 300 mg infusion (dose #3 of 5) via PIV. On arrival, pt presented in wheelchair with his father.  Pt reports having new right sided body numbness that he said started around 3 days ago. Pt also says that he has had issues with his speech and balance. These are all new symptoms from when pt was seen at this clinic last in November. RN reached out to Terrence Dupont, Therapist, sports from [facility] to see what Dr. Felecia Shelling recommends and if it is ok to proceed with infusion today. Per ordering provider we should proceed with Tysabri infusion today and that they would have pt come in to their office this week. Terrence Dupont, RN asked to see if we could ask the pt if he would be able to have an appointment at their office tomorrow AM at 930 AM. Pt notified, and verbalized that he could go to the appointment and Terrence Dupont, RN made aware and scheduled him. IV Tysabri received and pt tolerated the infusion with no issues. Pt vital signs are stable. At discharge, RN made pts father aware of appointment tomorrow at [facility] and he verbalized understanding. AVS printed and given to patients father. Pts next Tysabri infusion scheduled for March 5th at 8 AM, patient and his father verbalized understanding. Pt is oriented, alert, and in wheelchair at discharge. Pt discharged home with father.

## 2022-07-27 ENCOUNTER — Encounter: Payer: Self-pay | Admitting: Neurology

## 2022-07-27 ENCOUNTER — Telehealth: Payer: Self-pay

## 2022-07-27 ENCOUNTER — Ambulatory Visit (INDEPENDENT_AMBULATORY_CARE_PROVIDER_SITE_OTHER): Payer: 59 | Admitting: Neurology

## 2022-07-27 VITALS — BP 110/76 | HR 52

## 2022-07-27 DIAGNOSIS — G35 Multiple sclerosis: Secondary | ICD-10-CM | POA: Diagnosis not present

## 2022-07-27 DIAGNOSIS — R261 Paralytic gait: Secondary | ICD-10-CM

## 2022-07-27 DIAGNOSIS — G8114 Spastic hemiplegia affecting left nondominant side: Secondary | ICD-10-CM

## 2022-07-27 DIAGNOSIS — G35D Multiple sclerosis, unspecified: Secondary | ICD-10-CM

## 2022-07-27 DIAGNOSIS — Z79899 Other long term (current) drug therapy: Secondary | ICD-10-CM

## 2022-07-27 DIAGNOSIS — E559 Vitamin D deficiency, unspecified: Secondary | ICD-10-CM

## 2022-07-27 DIAGNOSIS — R3911 Hesitancy of micturition: Secondary | ICD-10-CM

## 2022-07-27 DIAGNOSIS — F319 Bipolar disorder, unspecified: Secondary | ICD-10-CM | POA: Diagnosis not present

## 2022-07-27 DIAGNOSIS — Z7409 Other reduced mobility: Secondary | ICD-10-CM | POA: Diagnosis not present

## 2022-07-27 MED ORDER — PREDNISONE 50 MG PO TABS: ORAL_TABLET | ORAL | 0 refills | Status: DC

## 2022-07-27 NOTE — Progress Notes (Signed)
Faxed office note to [employer]/Emily Ketner including documentation for need of power wheel chair. Fax: [phone]. Received fax confirmation.

## 2022-07-27 NOTE — Telephone Encounter (Signed)
Placed [facility] in [facility] for Pick-up. Results pending.   

## 2022-07-27 NOTE — Progress Notes (Signed)
[name]  PATIENT: Bradley Brandt DOB: 1980-07-07  REFERRING DOCTOR OR PCP:  Aaron Edelman SOURCE: patient, records form Dr. George Hugh, MRI reports and images on PACS  _________________________________   HISTORICAL  CHIEF COMPLAINT:  Chief Complaint  Patient presents with   Room 10    Pt is here Alone. Pt has a Relapse with his MS. Pt is unable to walk.  Pt speech has decreased. Pt memory has gotten worse. Pt states that he is having burning and tingling in his legs.     HISTORY OF PRESENT ILLNESS:  Bradley Brandt is a 42 y.o.  man with relapsing remitting multiple sclerosis.     Update 07/27/2022   Activities of daily living: Due to his inability to ambulate, he is unable to do many of the activities of daily living.  Specifically, he has difficulty with getting from room to room to use the toilet or to do simple chores such as meal prep.  Mobility evaluation: Due to impairments from his multiple sclerosis, he has left greater than right leg greater than arm weakness with spasticity.  He also has reduced balance.  For these reasons, he is unable to walk.  He does not have the stability to use a cane or walker.  Due to weakness on the left, he cannot use a manual wheelchair.  Therefore, he needs a powered option.  Would have difficulty getting on and off a scooter and he does not have enough currently radius in his house to use 1.  Therefore, he needs an IT trainer wheelchair.  Because his left arm is weaker and clumsier than the right, he needs controls to be on the right.  MS: He had missed several infusions and presented to the ED 07/11/2022 with an apparent rebound with an MRI showing multiple enbancing lesions in the brain.   There were multiple old spinal cord lesions and an enhancing new focus at T7.   He received 5 days of IV Solumedrol and was discharged.    He got his Tysabri infusion 07/26/2022 (looks like he had previous infusions 04/21/2022 and  03/25/2022)   We discussed Ocrevus or Kesimpta (as might improve compliance).   He has done well on Tysabri and prefers to stay on it and we discussed the importance of compliance as the benefit will wear off after 2-3 months if doses are skipped     He is on Tysabri and gets infusions at [facility].   He tolerates it well.   Last JCV Ab has been negative and we discussed getting another JCV today  He is not currently able to walk and is in a wheelchair.     He was able to walk 100 feet without a cane and longer with one before the currentl relapse.   Left foot drop has worsened and he now has trouble raising the left leg.  Balance is very poor and he cannot stand without strong bilateral support and cannot use a walker.[name]    He denies arm weakness or spasticity but is noted to have weakness in the left arm when tested   no numbness or tingling   Bladder function is the same with some urgency as well as hesitancy (helped by tamsulosin)     He is on baclofen for spasticity and dalfampridine for the walking.     Vision is doing the same.     He feels fatigued but this is stable.   He notes some depression, similar to  earlier in the year.   He has more anxiety.   He is seeing psychiatry again.   He was diagnosed with bipolar disease   He is sleeping worse.[name]   He notes cognition is about the same.  Focus is sometimes poor.         MS history: Bradley Brandt was diagnosed around 2001 when he presented with right-sided numbness and clumsiness and weakness.   Initially, he was on Avonex and then Rebif but had relapses on both of them.  He could not tolerate Tecfidera.   He was started on Tysabri about 1 year ago and his last infusion was at [facility] at 11/08/2015.  He was seen Dr. Gaynelle Adu [facility] and those records were reviewed. He did receive a course of Solu-Medrol March 2015 and another course of Solu-Medrol January 2016 when he presented with left leg weakness and numbness that was new     An MRI of the brain 08/28/2013 personally reviewed shows old extensive white matter changes in both hemispheres with multiple sclerosis. There were new areas of restricted diffusion without definite enhancement that could be consistent with ongoing demyelination when compared to the MRI from 05/13/2013.  He missed 2 doses of Tysabri December 2023 in January 2023 and had a rebound with multiple enhancing lesions towards the end of January 2024.  He was treated with high-dose steroids and Tysabri was resumed.  Imaging: MRI of the brain 08/30/2019 showed multiple T2/FLAIR hyperintense foci in the hemispheres, pons, cerebellum and thalamus in a pattern and configuration consistent with chronic demyelinating plaque associated with multiple sclerosis.  None of the foci appear to be acute and they do not enhance.  Compared to the MRI dated 01/14/2019, there is no interval progression.  MRI of the cervical spine 01/06/2018 showed T2 hyperintense foci within the spinal cord posteriorly adjacent to C2, to the right adjacent to C3, to the left adjacent to C4 and centrally and to the right adjacent to C4-C5 and C5.   None of these appear to be acute and they were all present on the 2015 MRI.[name]    MRI of the brain 01/06/2018 showed multiple T2/FLAIR hyperintense foci in the hemispheres and in the left middle cerebellar peduncle consistent with chronic demyelinating plaque associated with multiple sclerosis.  None of the foci appears to be acute.  None of them enhance.  However, when compared to the 2015 MRI focus in the cerebellum and a couple foci in the hemispheres were not clearly present on the earlier MRI.     REVIEW OF SYSTEMS: Constitutional: No fevers, chills, sweats, or change in appetite.   Notes fatigue Eyes: No visual changes, double vision, eye pain Ear, nose and throat: No hearing loss, ear pain, nasal congestion, sore throat Cardiovascular: No chest pain, palpitations Respiratory:  No shortness of  breath at rest or with exertion.   No wheezes GastrointestinaI: No nausea, vomiting, diarrhea, abdominal pain, fecal incontinence Genitourinary:  He has hesitancy and frequency.   No UTI's. Musculoskeletal:  No neck pain, back pain Integumentary: No rash, pruritus, skin lesions Neurological: as above Psychiatric: No depression at this time.  No anxiety Endocrine: No palpitations, diaphoresis, change in appetite, change in weigh or increased thirst Hematologic/Lymphatic:  No anemia, purpura, petechiae. Allergic/Immunologic: No itchy/runny eyes, nasal congestion, recent allergic reactions, rashes  ALLERGIES: Allergies  Allergen Reactions   Lithium Rash    HOME MEDICATIONS:  Current Outpatient Medications:    acetaminophen (TYLENOL) 500 MG tablet, Take 500  mg by mouth every 8 (eight) hours as needed for moderate pain., Disp: , Rfl:    baclofen (LIORESAL) 20 MG tablet, Take 1 tablet (20 mg total) by mouth 3 (three) times daily., Disp: 90 tablet, Rfl: 0   DULoxetine (CYMBALTA) 30 MG capsule, Take 1 capsule (30 mg total) by mouth daily., Disp: 90 capsule, Rfl: 0   gabapentin (NEURONTIN) 800 MG tablet, Take 1 tablet (800 mg total) by mouth 2 (two) times daily., Disp: 60 tablet, Rfl: 11   lidocaine (HM LIDOCAINE PATCH) 4 %, Place 1 patch onto the skin daily., Disp: 15 patch, Rfl: 0   natalizumab (TYSABRI) 300 MG/15ML injection, Inject 15 mLs (300 mg total) into the vein every 28 (twenty-eight) days., Disp: 15 mL, Rfl: 5   predniSONE (DELTASONE) 50 MG tablet, Twenty (1000 mg) po qd x 3 days, Disp: 60 tablet, Rfl: 0   QUEtiapine (SEROQUEL) 200 MG tablet, Take 1 tablet (200 mg total) by mouth at bedtime. Take a half tablet at night for 4 days then increase to a whole tablet each night., Disp: 90 tablet, Rfl: 2   Vitamin D, Ergocalciferol, (DRISDOL) 1.25 MG (50000 UNIT) CAPS capsule, Take 1 capsule (50,000 Units total) by mouth every 7 (seven) days., Disp: 13 capsule, Rfl: 3  PAST MEDICAL  HISTORY: Past Medical History:  Diagnosis Date   Bipolar 1 disorder ([place])    Bipolar disorder, unspecified ([place]) 01/03/2013   MS (multiple sclerosis) ([place]) dx 2006   relapsing-remitting right sided weakness   Neuromuscular disorder (HCC)    MS    PAST SURGICAL HISTORY: Past Surgical History:  Procedure Laterality Date   TUMOR REMOVAL  1983   abdomen benign    FAMILY HISTORY: Family History  Adopted: Yes  Problem Relation Age of Onset   Lupus Mother    Ataxia Neg Hx    Chorea Neg Hx    Dementia Neg Hx    Mental retardation Neg Hx    Migraines Neg Hx    Multiple sclerosis Neg Hx    Neurofibromatosis Neg Hx    Neuropathy Neg Hx    Parkinsonism Neg Hx    Seizures Neg Hx    Stroke Neg Hx     SOCIAL HISTORY:  Social History   Socioeconomic History   Marital status: Single    Spouse name: Not on file   Number of children: Not on file   Years of education: Not on file   Highest education level: Not on file  Occupational History   Not on file  Tobacco Use   Smoking status: Every Day    Packs/day: 1.00    Types: Cigarettes   Smokeless tobacco: Never  Vaping Use   Vaping Use: Former  Substance and Sexual Activity   Alcohol use: Not Currently    Alcohol/week: 72.0 standard drinks of alcohol    Types: 10 Glasses of wine, 12 Cans of beer, 50 Shots of liquor per week   Drug use: Not Currently    Frequency: 7.0 times per week    Types: Marijuana    Comment: daily   Sexual activity: Yes    Partners: Female    Birth control/protection: None  Other Topics Concern   Not on file  Social History Narrative   Not on file   Social Determinants of Health   Financial Resource Strain: Not on file  Food Insecurity: No Food Insecurity (07/12/2022)   Hunger Vital Sign    Worried About Running Out of Food in the  Last Year: Never true    Ran Out of Food in the Last Year: Never true  Transportation Needs: No Transportation Needs (07/12/2022)   PRAPARE - Therapist, art (Medical): No    Lack of Transportation (Non-Medical): No  Physical Activity: Not on file  Stress: Not on file  Social Connections: Not on file  Intimate Partner Violence: Not At Risk (07/12/2022)   Humiliation, Afraid, Rape, and Kick questionnaire    Fear of Current or Ex-Partner: No    Emotionally Abused: No    Physically Abused: No    Sexually Abused: No     PHYSICAL EXAM  Vitals:   07/27/22 0924  BP: 110/76  Pulse: (!) 52    There is no height or weight on file to calculate BMI.   General: The patient is well-developed and well-nourished and in no acute distress.   Neurologic Exam   Mental status: The patient is alert and oriented x 3 at the time of the examination. The patient has apparent normal recent and remote memory, with an apparently normal attention span and concentration ability.   Speech is normal.   Cranial nerves: Extraocular movements are full.   2+ right APD.   Reduced visual acuity and reduced color vision OD     Facial strength is normal. Trapezius strength is strong.  No obvious hearing deficits are noted.   Motor:  Muscle bulk is normal.   Slowed rapid altering movements in left  hand.  There is increased muscle tone in the left arm and both legs  Strength is 5/5 in the right arm, for a left triceps and 4+/5 in the left arm.  And 4-/5 in the right leg and 4/5 in the left leg.      Sensory: He reports symmetric sensationin arms bu reduced left leg vibration sensation.  TOuch is symmetric.      Coordination: Cerebellar testing reveals mildly reduced finger-nose-finger and very poor left heel-to-shin   Gait and station: He is unable to stand without bilateral support.  He cannot walk.  Romberg is borderline   Reflexes: Deep tendon reflexes are increased with spread at the knees.  He has nonsustained clonus at the ankles       ____________________________________________  MS (multiple sclerosis) (HCC) - Plan: AMB REFERRAL TO PACE,  Stratify JCV Ab (w/ Index) w/ Rflx, CBC with Differential/Platelet, CBC with Differential/Platelet  Mobility impaired  High risk medication use - Plan: AMB REFERRAL TO PACE, Stratify JCV Ab (w/ Index) w/ Rflx, CBC with Differential/Platelet, CBC with Differential/Platelet  Spastic hemiplegia of left nondominant side due to noncerebrovascular etiology (HCC) - Plan: AMB REFERRAL TO PACE  Vitamin D deficiency  Urinary hesitancy  Spastic gait  Bipolar affective disorder, remission status unspecified (HCC)  1.   Due to impairments from MS including left hemiplegia and inability to walk, he needs an electric wheelchair.  Head control should be on the right.  He has the cognitive ability to operate this type of wheelchair and is motivated to do so. 2.   Continue gabapentin for dysesthesia and back pain. 3.   I will do another day of IV steroids followed by 1000 mg prednisone qd x 3 d -- likely has had an additioanl exacerbation, possibly after MRis 10 days ago.     Now back on Tysabri.  I discussed with him the importance of keeping to his scheduled dose.  If he has difficulty getting to the infusion he needs to  let us know so we can make sure that he does not last 3 months go by again. 4.   Stay active.   Exercise as tolerated 5.  He does not have consistent help.  We will see if he qualifies for pace which may simplify his care  return in 6 months or sooner if new or worsening neurologic symptoms.  45-minute office visit with the majority of the time spent face-to-face for history and physical, discussion/counseling and decision-making.  Additional time with record review and documentation.   This visit is part of a comprehensive longitudinal care medical relationship regarding the patients primary diagnosis of multiple sclerosis and related concerns.   Bradley Brandt A. Felecia Shelling, MD, PhD 06/25/5100, 5:85 PM Certified in Neurology, Clinical Neurophysiology, Sleep Medicine, Pain Medicine and  Neuroimaging  [facility] [address], [place], [place] [address] [phone]

## 2022-07-28 ENCOUNTER — Encounter (HOSPITAL_COMMUNITY): Payer: 59 | Admitting: Student in an Organized Health Care Education/Training Program

## 2022-07-28 ENCOUNTER — Other Ambulatory Visit: Payer: Self-pay | Admitting: Neurology

## 2022-07-28 ENCOUNTER — Telehealth: Payer: Self-pay | Admitting: Neurology

## 2022-07-28 LAB — CBC WITH DIFFERENTIAL/PLATELET
Basophils Absolute: 0 10*3/uL (ref 0.0–0.2)
Basos: 0 %
EOS (ABSOLUTE): 0.1 10*3/uL (ref 0.0–0.4)
Eos: 1 %
Hematocrit: 51.1 % — ABNORMAL HIGH (ref 37.5–51.0)
Hemoglobin: 16.7 g/dL (ref 13.0–17.7)
Immature Grans (Abs): 0 10*3/uL (ref 0.0–0.1)
Immature Granulocytes: 0 %
Lymphocytes Absolute: 2.8 10*3/uL (ref 0.7–3.1)
Lymphs: 37 %
MCH: 31 pg (ref 26.6–33.0)
MCHC: 32.7 g/dL (ref 31.5–35.7)
MCV: 95 fL (ref 79–97)
Monocytes Absolute: 0.6 10*3/uL (ref 0.1–0.9)
Monocytes: 8 %
Neutrophils Absolute: 4 10*3/uL (ref 1.4–7.0)
Neutrophils: 54 %
Platelets: 231 10*3/uL (ref 150–450)
RBC: 5.39 x10E6/uL (ref 4.14–5.80)
RDW: 13.4 % (ref 11.6–15.4)
WBC: 7.5 10*3/uL (ref 3.4–10.8)

## 2022-07-28 LAB — MISC LABCORP TEST (SEND OUT): Labcorp test code: 9985

## 2022-07-28 MED ORDER — PREDNISONE 50 MG PO TABS: ORAL_TABLET | ORAL | 0 refills | Status: DC

## 2022-07-28 NOTE — Progress Notes (Deleted)
Bradley Brandt/PA/NP OP Progress Note  07/28/2022 7:06 AM Bradley Brandt  MRN:  [id number]  Chief Complaint: No chief complaint on file.  HPI:  Bradley Brandt. [name] is a 42 year old male who presents for Follow Up and Medication Management.  PPHx is significant for Bipolar Disorder, Insomnia, and GAD.  PMH is significant for MS.   ***   Visit Diagnosis: No diagnosis found.  Past Psychiatric History: Bipolar Disorder, Insomnia, and GAD.  PMH is significant for MS.   Past Medical History:  Past Medical History:  Diagnosis Date   Bipolar 1 disorder ([place])    Bipolar disorder, unspecified ([place]) 01/03/2013   MS (multiple sclerosis) ([place]) dx 2006   relapsing-remitting right sided weakness   Neuromuscular disorder (HCC)    MS    Past Surgical History:  Procedure Laterality Date   TUMOR REMOVAL  1983   abdomen benign    Family Psychiatric History: ***  Family History:  Family History  Adopted: Yes  Problem Relation Age of Onset   Lupus Mother    Ataxia Neg Hx    Chorea Neg Hx    Dementia Neg Hx    Mental retardation Neg Hx    Migraines Neg Hx    Multiple sclerosis Neg Hx    Neurofibromatosis Neg Hx    Neuropathy Neg Hx    Parkinsonism Neg Hx    Seizures Neg Hx    Stroke Neg Hx     Social History:  Social History   Socioeconomic History   Marital status: Single    Spouse name: Not on file   Number of children: Not on file   Years of education: Not on file   Highest education level: Not on file  Occupational History   Not on file  Tobacco Use   Smoking status: Every Day    Packs/day: 1.00    Types: Cigarettes   Smokeless tobacco: Never  Vaping Use   Vaping Use: Former  Substance and Sexual Activity   Alcohol use: Not Currently    Alcohol/week: 72.0 standard drinks of alcohol    Types: 10 Glasses of wine, 12 Cans of beer, 50 Shots of liquor per week   Drug use: Not Currently    Frequency: 7.0 times per week    Types: Marijuana    Comment: daily   Sexual activity:  Yes    Partners: Female    Birth control/protection: None  Other Topics Concern   Not on file  Social History Narrative   Not on file   Social Determinants of Health   Financial Resource Strain: Not on file  Food Insecurity: No Food Insecurity (07/12/2022)   Hunger Vital Sign    Worried About Running Out of Food in the Last Year: Never true    Ran Out of Food in the Last Year: Never true  Transportation Needs: No Transportation Needs (07/12/2022)   PRAPARE - Hydrologist (Medical): No    Lack of Transportation (Non-Medical): No  Physical Activity: Not on file  Stress: Not on file  Social Connections: Not on file    Allergies:  Allergies  Allergen Reactions   Lithium Rash    Metabolic Disorder Labs: No results found for: "HGBA1C", "MPG" No results found for: "PROLACTIN" Lab Results  Component Value Date   CHOL 173 04/13/2021   TRIG 119 04/13/2021   HDL 37 (L) 04/13/2021   CHOLHDL 4.7 04/13/2021   LDLCALC 114 (H) 04/13/2021   LDLCALC 136 (H)  04/08/2020   No results found for: "TSH"  Therapeutic Level Labs: No results found for: "LITHIUM" No results found for: "VALPROATE" No results found for: "CBMZ"  Current Medications: Current Outpatient Medications  Medication Sig Dispense Refill   acetaminophen (TYLENOL) 500 MG tablet Take 500 mg by mouth every 8 (eight) hours as needed for moderate pain.     baclofen (LIORESAL) 20 MG tablet Take 1 tablet (20 mg total) by mouth 3 (three) times daily. 90 tablet 0   DULoxetine (CYMBALTA) 30 MG capsule Take 1 capsule (30 mg total) by mouth daily. 90 capsule 0   gabapentin (NEURONTIN) 800 MG tablet Take 1 tablet (800 mg total) by mouth 2 (two) times daily. 60 tablet 11   lidocaine (HM LIDOCAINE PATCH) 4 % Place 1 patch onto the skin daily. 15 patch 0   natalizumab (TYSABRI) 300 MG/15ML injection Inject 15 mLs (300 mg total) into the vein every 28 (twenty-eight) days. 15 mL 5   predniSONE (DELTASONE) 50  MG tablet Twenty (1000 mg) po qd x 3 days 60 tablet 0   QUEtiapine (SEROQUEL) 200 MG tablet Take 1 tablet (200 mg total) by mouth at bedtime. Take a half tablet at night for 4 days then increase to a whole tablet each night. 90 tablet 2   Vitamin D, Ergocalciferol, (DRISDOL) 1.25 MG (50000 UNIT) CAPS capsule Take 1 capsule (50,000 Units total) by mouth every 7 (seven) days. 13 capsule 3   No current facility-administered medications for this visit.     Musculoskeletal: Strength & Muscle Tone: {desc; muscle tone:32375} Gait & Station: {PE GAIT ED EF:6704556 Patient leans: {Patient Leans:21022755}  Psychiatric Specialty Exam: Review of Systems  There were no vitals taken for this visit.There is no height or weight on file to calculate BMI.  General Appearance: {Appearance:22683}  Eye Contact:  {BHH EYE CONTACT:22684}  Speech:  {Speech:22685}  Volume:  {Volume (PAA):22686}  Mood:  {BHH MOOD:22306}  Affect:  {Affect (PAA):22687}  Thought Process:  {Thought Process (PAA):22688}  Orientation:  {BHH ORIENTATION (PAA):22689}  Thought Content: {Thought Content:22690}   Suicidal Thoughts:  {ST/HT (PAA):22692}  Homicidal Thoughts:  {ST/HT (PAA):22692}  Memory:  {BHH MEMORY:22881}  Judgement:  {Judgement (PAA):22694}  Insight:  {Insight (PAA):22695}  Psychomotor Activity:  {Psychomotor (PAA):22696}  Concentration:  {Concentration:21399}  Recall:  {BHH GOOD/FAIR/POOR:22877}  Fund of Knowledge: {BHH GOOD/FAIR/POOR:22877}  Language: {BHH GOOD/FAIR/POOR:22877}  Akathisia:  {BHH YES OR NO:22294}  Handed:  Right  AIMS (if indicated): {Desc; done/not:10129}  Assets:  {Assets (PAA):22698}  ADL's:  {BHH XO:4411959  Cognition: {chl bhh cognition:304700322}  Sleep:  {BHH GOOD/FAIR/POOR:22877}   Screenings: GAD-7    Flowsheet Row Video Visit from 08/04/2021 in [facility] Video Visit from 06/14/2021 in [place] from  09/21/2020 in [facility] Office Visit from 08/11/2020 in [facility] Office Visit from 06/09/2020 in [facility]  Total GAD-7 Score 21 20 17 21 19      $ PHQ2-9    [place] Office Visit from 10/12/2021 in [place] Most recent reading at 10/12/2021  8:29 AM Video Visit from 08/04/2021 in [facility] Most recent reading at 08/04/2021  1:55 PM Video Visit from 06/14/2021 in [facility] Most recent reading at 06/14/2021  1:15 PM ED from 04/13/2021 in [facility] Most recent reading at 04/13/2021 12:40 PM Office Visit from 04/13/2021 in [place]  [facility] [place] Most recent reading at 04/13/2021  8:17 AM  PHQ-2 Total Score 4 6 6 3 6  $ PHQ-9 Total Score 4 24 17 12 27      $ Flowsheet Row ED to Hosp-Admission (Discharged) from 07/11/2022 in [facility] Video Visit from 08/04/2021 in [facility] Video Visit from 06/14/2021 in [place] No Risk Low Risk Low Risk        Assessment and Plan:  Dakari Irvan. Braden is a 42 year old male who presents for Follow Up and Medication Management.  PPHx is significant for Bipolar Disorder, Insomnia, and GAD.  PMH is significant for MS.   ***   Bipolar Affective Disorder, remission status unspecified:  -Restart Seroquel 100 mg QHS for 4 days then increase to 200 mg QHS.  90 (200 mg) tablets with 2 refills.  -At this time will not restart Lexapro 10 mg daily for depression and anxiety.  No refills sent at this time.     GAD: -At this time will not restart Lexapro 10 mg daily for depression and anxiety.  No refills sent at this time. -At this time will not restart Hydroxyzine 50 mg TID PRN.  No refills sent at this time. -Restart Gabapentin 800 mg  BID for anxiety and pain.  60 tablets with 1 refill.     Chronic Pain due to MS: -Restart Gabapentin 800 mg BID for anxiety and pain.  60 tablets with 1 refill. -Referral to Pain Management sent    Collaboration of Care: Collaboration of Care: {BH OP Collaboration of Care:21014065}  Patient/Guardian was advised Release of Information must be obtained prior to any record release in order to collaborate their care with an outside provider. Patient/Guardian was advised if they have not already done so to contact the registration department to sign all necessary forms in order for [place] to release information regarding their care.   Consent: Patient/Guardian gives verbal consent for treatment and assignment of benefits for services provided during this visit. Patient/Guardian expressed understanding and agreed to proceed.    Briant Cedar, Brandt 07/28/2022, 7:06 AM

## 2022-07-28 NOTE — Telephone Encounter (Signed)
Pt father is calling. Stated pt fell and medication predniSONE (DELTASONE) 50 MG tablet was on the table and it fell all over the floor and 21 pills was damage with urine. Pt father is asking if Dr. Felecia Shelling will replace these pills so he can finish this medication.

## 2022-07-30 ENCOUNTER — Encounter (HOSPITAL_COMMUNITY): Payer: Self-pay | Admitting: Emergency Medicine

## 2022-07-30 ENCOUNTER — Other Ambulatory Visit: Payer: Self-pay

## 2022-07-30 ENCOUNTER — Inpatient Hospital Stay (HOSPITAL_COMMUNITY)
Admission: EM | Admit: 2022-07-30 | Discharge: 2022-08-10 | DRG: 059 | Disposition: A | Discharge status: Skilled Nursing Facility | Payer: 59 | Attending: Internal Medicine | Admitting: Internal Medicine

## 2022-07-30 ENCOUNTER — Emergency Department (HOSPITAL_COMMUNITY): Payer: 59

## 2022-07-30 DIAGNOSIS — G35 Multiple sclerosis: Principal | ICD-10-CM

## 2022-07-30 DIAGNOSIS — Z888 Allergy status to other drugs, medicaments and biological substances status: Secondary | ICD-10-CM | POA: Diagnosis not present

## 2022-07-30 DIAGNOSIS — R338 Other retention of urine: Secondary | ICD-10-CM | POA: Diagnosis present

## 2022-07-30 DIAGNOSIS — F419 Anxiety disorder, unspecified: Secondary | ICD-10-CM | POA: Diagnosis not present

## 2022-07-30 DIAGNOSIS — S70212A Abrasion, left hip, initial encounter: Secondary | ICD-10-CM | POA: Diagnosis present

## 2022-07-30 DIAGNOSIS — R296 Repeated falls: Secondary | ICD-10-CM | POA: Diagnosis present

## 2022-07-30 DIAGNOSIS — D539 Nutritional anemia, unspecified: Secondary | ICD-10-CM | POA: Diagnosis present

## 2022-07-30 DIAGNOSIS — N200 Calculus of kidney: Secondary | ICD-10-CM | POA: Diagnosis present

## 2022-07-30 DIAGNOSIS — R4701 Aphasia: Secondary | ICD-10-CM | POA: Diagnosis present

## 2022-07-30 DIAGNOSIS — Z79899 Other long term (current) drug therapy: Secondary | ICD-10-CM

## 2022-07-30 DIAGNOSIS — R531 Weakness: Principal | ICD-10-CM

## 2022-07-30 DIAGNOSIS — Y92009 Unspecified place in unspecified non-institutional (private) residence as the place of occurrence of the external cause: Secondary | ICD-10-CM | POA: Diagnosis not present

## 2022-07-30 DIAGNOSIS — Z832 Family history of diseases of the blood and blood-forming organs and certain disorders involving the immune mechanism: Secondary | ICD-10-CM | POA: Diagnosis not present

## 2022-07-30 DIAGNOSIS — F17210 Nicotine dependence, cigarettes, uncomplicated: Secondary | ICD-10-CM | POA: Diagnosis present

## 2022-07-30 DIAGNOSIS — E46 Unspecified protein-calorie malnutrition: Secondary | ICD-10-CM | POA: Diagnosis not present

## 2022-07-30 DIAGNOSIS — N319 Neuromuscular dysfunction of bladder, unspecified: Secondary | ICD-10-CM | POA: Diagnosis present

## 2022-07-30 DIAGNOSIS — E860 Dehydration: Secondary | ICD-10-CM | POA: Diagnosis present

## 2022-07-30 DIAGNOSIS — W050XXA Fall from non-moving wheelchair, initial encounter: Secondary | ICD-10-CM | POA: Diagnosis present

## 2022-07-30 DIAGNOSIS — E441 Mild protein-calorie malnutrition: Secondary | ICD-10-CM | POA: Diagnosis present

## 2022-07-30 DIAGNOSIS — R7401 Elevation of levels of liver transaminase levels: Secondary | ICD-10-CM

## 2022-07-30 DIAGNOSIS — E559 Vitamin D deficiency, unspecified: Secondary | ICD-10-CM | POA: Diagnosis present

## 2022-07-30 DIAGNOSIS — G4700 Insomnia, unspecified: Secondary | ICD-10-CM | POA: Diagnosis present

## 2022-07-30 DIAGNOSIS — R079 Chest pain, unspecified: Secondary | ICD-10-CM | POA: Diagnosis present

## 2022-07-30 DIAGNOSIS — Z1152 Encounter for screening for COVID-19: Secondary | ICD-10-CM | POA: Diagnosis not present

## 2022-07-30 DIAGNOSIS — F411 Generalized anxiety disorder: Secondary | ICD-10-CM | POA: Diagnosis present

## 2022-07-30 DIAGNOSIS — D696 Thrombocytopenia, unspecified: Secondary | ICD-10-CM

## 2022-07-30 DIAGNOSIS — M6282 Rhabdomyolysis: Secondary | ICD-10-CM

## 2022-07-30 DIAGNOSIS — D649 Anemia, unspecified: Secondary | ICD-10-CM

## 2022-07-30 DIAGNOSIS — F319 Bipolar disorder, unspecified: Secondary | ICD-10-CM | POA: Diagnosis present

## 2022-07-30 DIAGNOSIS — R509 Fever, unspecified: Secondary | ICD-10-CM | POA: Diagnosis present

## 2022-07-30 LAB — CBC WITH DIFFERENTIAL/PLATELET
Abs Immature Granulocytes: 0.03 10*3/uL (ref 0.00–0.07)
Basophils Absolute: 0 10*3/uL (ref 0.0–0.1)
Basophils Relative: 0 %
Eosinophils Absolute: 0 10*3/uL (ref 0.0–0.5)
Eosinophils Relative: 0 %
HCT: 27.3 % — ABNORMAL LOW (ref 39.0–52.0)
Hemoglobin: 8.6 g/dL — ABNORMAL LOW (ref 13.0–17.0)
Immature Granulocytes: 0 %
Lymphocytes Relative: 21 %
Lymphs Abs: 1.4 10*3/uL (ref 0.7–4.0)
MCH: 32.7 pg (ref 26.0–34.0)
MCHC: 31.5 g/dL (ref 30.0–36.0)
MCV: 103.8 fL — ABNORMAL HIGH (ref 80.0–100.0)
Monocytes Absolute: 0.7 10*3/uL (ref 0.1–1.0)
Monocytes Relative: 9 %
Neutro Abs: 4.8 10*3/uL (ref 1.7–7.7)
Neutrophils Relative %: 70 %
Platelets: 108 10*3/uL — ABNORMAL LOW (ref 150–400)
RBC: 2.63 MIL/uL — ABNORMAL LOW (ref 4.22–5.81)
RDW: 14.2 % (ref 11.5–15.5)
WBC: 6.9 10*3/uL (ref 4.0–10.5)
nRBC: 0.6 % — ABNORMAL HIGH (ref 0.0–0.2)

## 2022-07-30 LAB — COMPREHENSIVE METABOLIC PANEL
ALT: 67 U/L — ABNORMAL HIGH (ref 0–44)
AST: 147 U/L — ABNORMAL HIGH (ref 15–41)
Albumin: 2.9 g/dL — ABNORMAL LOW (ref 3.5–5.0)
Alkaline Phosphatase: 55 U/L (ref 38–126)
Anion gap: 11 (ref 5–15)
BUN: 23 mg/dL — ABNORMAL HIGH (ref 6–20)
CO2: 19 mmol/L — ABNORMAL LOW (ref 22–32)
Calcium: 7.9 mg/dL — ABNORMAL LOW (ref 8.9–10.3)
Chloride: 113 mmol/L — ABNORMAL HIGH (ref 98–111)
Creatinine, Ser: 1.06 mg/dL (ref 0.61–1.24)
GFR, Estimated: >60 mL/min (ref >60)
Glucose, Bld: 88 mg/dL (ref 70–99)
Potassium: 3.8 mmol/L (ref 3.5–5.1)
Sodium: 143 mmol/L (ref 135–145)
Total Bilirubin: 1 mg/dL (ref 0.3–1.2)
Total Protein: 5.1 g/dL — ABNORMAL LOW (ref 6.5–8.1)

## 2022-07-30 LAB — URINALYSIS, ROUTINE W REFLEX MICROSCOPIC
Bacteria, UA: NONE SEEN
Bilirubin Urine: NEGATIVE
Glucose, UA: NEGATIVE mg/dL
Ketones, ur: NEGATIVE mg/dL
Nitrite: NEGATIVE
Protein, ur: NEGATIVE mg/dL
Specific Gravity, Urine: >1.046 — ABNORMAL HIGH (ref 1.005–1.030)
WBC, UA: >50 WBC/hpf (ref 0–5)
pH: 6 (ref 5.0–8.0)

## 2022-07-30 LAB — CK
Total CK: 8386 U/L — ABNORMAL HIGH (ref 49–397)
Total CK: 7729 U/L — ABNORMAL HIGH (ref 49–397)
Total CK: 6136 U/L — ABNORMAL HIGH (ref 49–397)
Total CK: 4864 U/L — ABNORMAL HIGH (ref 49–397)

## 2022-07-30 LAB — MAGNESIUM: Magnesium: 2.1 mg/dL (ref 1.7–2.4)

## 2022-07-30 LAB — PROTIME-INR
INR: 1.1 (ref 0.8–1.2)
Prothrombin Time: 13.9 seconds (ref 11.4–15.2)

## 2022-07-30 LAB — VITAMIN B12: Vitamin B-12: 692 pg/mL (ref 180–914)

## 2022-07-30 LAB — I-STAT VENOUS BLOOD GAS, ED
Acid-Base Excess: 1 mmol/L (ref 0.0–2.0)
Bicarbonate: 24.3 mmol/L (ref 20.0–28.0)
Calcium, Ion: 1.1 mmol/L — ABNORMAL LOW (ref 1.15–1.40)
HCT: 41 % (ref 39.0–52.0)
Hemoglobin: 13.9 g/dL (ref 13.0–17.0)
O2 Saturation: 97 %
Potassium: 3.8 mmol/L (ref 3.5–5.1)
Sodium: 141 mmol/L (ref 135–145)
TCO2: 25 mmol/L (ref 22–32)
pCO2, Ven: 34.7 mmHg — ABNORMAL LOW (ref 44–60)
pH, Ven: 7.453 — ABNORMAL HIGH (ref 7.25–7.43)
pO2, Ven: 84 mmHg — ABNORMAL HIGH (ref 32–45)

## 2022-07-30 LAB — DIFFERENTIAL
Abs Immature Granulocytes: 0.04 10*3/uL (ref 0.00–0.07)
Basophils Absolute: 0 10*3/uL (ref 0.0–0.1)
Basophils Relative: 0 %
Eosinophils Absolute: 0.1 10*3/uL (ref 0.0–0.5)
Eosinophils Relative: 1 %
Immature Granulocytes: 0 %
Lymphocytes Relative: 31 %
Lymphs Abs: 3 10*3/uL (ref 0.7–4.0)
Monocytes Absolute: 0.7 10*3/uL (ref 0.1–1.0)
Monocytes Relative: 7 %
Neutro Abs: 5.9 10*3/uL (ref 1.7–7.7)
Neutrophils Relative %: 61 %

## 2022-07-30 LAB — LACTATE DEHYDROGENASE: LDH: 395 U/L — ABNORMAL HIGH (ref 98–192)

## 2022-07-30 LAB — RETICULOCYTES
Immature Retic Fract: 4.9 % (ref 2.3–15.9)
Immature Retic Fract: 2.1 % — ABNORMAL LOW (ref 2.3–15.9)
RBC.: 4.51 MIL/uL (ref 4.22–5.81)
RBC.: 4.31 MIL/uL (ref 4.22–5.81)
Retic Count, Absolute: 46.9 10*3/uL (ref 19.0–186.0)
Retic Count, Absolute: 27.6 10*3/uL (ref 19.0–186.0)
Retic Ct Pct: 1 % (ref 0.4–3.1)
Retic Ct Pct: 0.6 % (ref 0.4–3.1)

## 2022-07-30 LAB — TSH: TSH: 1.197 u[IU]/mL (ref 0.350–4.500)

## 2022-07-30 LAB — TYPE AND SCREEN
ABO/RH(D): A POS
Antibody Screen: NEGATIVE

## 2022-07-30 LAB — FOLATE: Folate: 8.1 ng/mL (ref >5.9)

## 2022-07-30 LAB — PHOSPHORUS: Phosphorus: 2.9 mg/dL (ref 2.5–4.6)

## 2022-07-30 LAB — ETHANOL: Alcohol, Ethyl (B): <10 mg/dL (ref <10)

## 2022-07-30 LAB — POC OCCULT BLOOD, ED: Fecal Occult Bld: NEGATIVE

## 2022-07-30 LAB — ABO/RH: ABO/RH(D): A POS

## 2022-07-30 LAB — HEMOGLOBIN AND HEMATOCRIT, BLOOD
HCT: 40.1 % (ref 39.0–52.0)
Hemoglobin: 13.9 g/dL (ref 13.0–17.0)

## 2022-07-30 LAB — LACTIC ACID, PLASMA
Lactic Acid, Venous: 1.5 mmol/L (ref 0.5–1.9)
Lactic Acid, Venous: 1.1 mmol/L (ref 0.5–1.9)

## 2022-07-30 LAB — TROPONIN I (HIGH SENSITIVITY)
Troponin I (High Sensitivity): 17 ng/L (ref <18)
Troponin I (High Sensitivity): 20 ng/L — ABNORMAL HIGH (ref <18)

## 2022-07-30 LAB — URIC ACID: Uric Acid, Serum: 3.9 mg/dL (ref 3.7–8.6)

## 2022-07-30 MED ORDER — LORAZEPAM 1 MG PO TABS
1.0000 mg | ORAL_TABLET | Freq: Once | ORAL | Status: AC
  Administered 2022-07-30: 1 mg via ORAL
  Filled 2022-07-30: qty 1

## 2022-07-30 MED ORDER — NAPHAZOLINE-GLYCERIN 0.012-0.25 % OP SOLN: 1.0000 [drp] | Freq: Four times a day (QID) | OPHTHALMIC | Status: DC | PRN

## 2022-07-30 MED ORDER — SENNOSIDES-DOCUSATE SODIUM 8.6-50 MG PO TABS
1.0000 | ORAL_TABLET | Freq: Every evening | ORAL | Status: DC | PRN
  Administered 2022-07-31: 1 via ORAL
  Filled 2022-07-30: qty 1

## 2022-07-30 MED ORDER — DULOXETINE HCL 30 MG PO CPEP
30.0000 mg | ORAL_CAPSULE | Freq: Every day | ORAL | Status: DC
  Administered 2022-07-30 – 2022-08-10 (×12): 30 mg via ORAL
  Filled 2022-07-30 (×12): qty 1

## 2022-07-30 MED ORDER — ACETAMINOPHEN 650 MG RE SUPP: 650.0000 mg | Freq: Four times a day (QID) | RECTAL | Status: DC | PRN

## 2022-07-30 MED ORDER — GABAPENTIN 400 MG PO CAPS
800.0000 mg | ORAL_CAPSULE | Freq: Two times a day (BID) | ORAL | Status: AC
  Administered 2022-07-30 – 2022-08-04 (×12): 800 mg via ORAL
  Filled 2022-07-30 (×12): qty 2

## 2022-07-30 MED ORDER — SODIUM CHLORIDE 0.9 % IV BOLUS
1000.0000 mL | Freq: Once | INTRAVENOUS | Status: AC
  Administered 2022-07-30: 1000 mL via INTRAVENOUS

## 2022-07-30 MED ORDER — QUETIAPINE FUMARATE 100 MG PO TABS
200.0000 mg | ORAL_TABLET | Freq: Every day | ORAL | Status: DC
  Administered 2022-07-30 – 2022-08-09 (×11): 200 mg via ORAL
  Filled 2022-07-30 (×11): qty 2

## 2022-07-30 MED ORDER — HYDROMORPHONE HCL 1 MG/ML IJ SOLN
0.5000 mg | Freq: Once | INTRAMUSCULAR | Status: AC
  Administered 2022-07-30: 0.5 mg via INTRAVENOUS
  Filled 2022-07-30: qty 1

## 2022-07-30 MED ORDER — ACETAMINOPHEN 325 MG PO TABS: 650.0000 mg | ORAL_TABLET | Freq: Four times a day (QID) | ORAL | Status: DC | PRN

## 2022-07-30 MED ORDER — IOHEXOL 350 MG/ML SOLN
75.0000 mL | Freq: Once | INTRAVENOUS | Status: AC | PRN
  Administered 2022-07-30: 75 mL via INTRAVENOUS

## 2022-07-30 MED ORDER — BACLOFEN 10 MG PO TABS
20.0000 mg | ORAL_TABLET | Freq: Three times a day (TID) | ORAL | Status: DC
  Administered 2022-07-30 (×3): 20 mg via ORAL
  Filled 2022-07-30 (×4): qty 2

## 2022-07-30 MED ORDER — LACTATED RINGERS IV BOLUS: 1000.0000 mL | Freq: Once | INTRAVENOUS | Status: DC

## 2022-07-30 MED ORDER — ONDANSETRON HCL 4 MG PO TABS: 4.0000 mg | ORAL_TABLET | Freq: Four times a day (QID) | ORAL | Status: DC | PRN

## 2022-07-30 MED ORDER — LACTATED RINGERS IV BOLUS
1000.0000 mL | INTRAVENOUS | Status: AC
  Administered 2022-07-30 (×3): 1000 mL via INTRAVENOUS

## 2022-07-30 MED ORDER — ACETAMINOPHEN 500 MG PO TABS
1000.0000 mg | ORAL_TABLET | Freq: Four times a day (QID) | ORAL | Status: DC | PRN
  Administered 2022-07-30: 1000 mg via ORAL
  Filled 2022-07-30: qty 2

## 2022-07-30 MED ORDER — LACTATED RINGERS IV SOLN: INTRAVENOUS | Status: DC

## 2022-07-30 MED ORDER — ONDANSETRON HCL 4 MG/2ML IJ SOLN: 4.0000 mg | Freq: Four times a day (QID) | INTRAMUSCULAR | Status: DC | PRN

## 2022-07-30 NOTE — H&P (Signed)
Date: 07/30/2022               Patient Name:  Bradley Brandt MRN: [id number]  DOB: [birth year] Age / Sex: 42 y.o., male   PCP: Fenton Foy, NP         Medical Service: Internal Medicine Teaching Service         Attending Physician: Dr. Jimmye Norman, Elaina Pattee, MD    First Contact: Dr. Christene Slates  Pager: [phone]   Second Contact: Dr. Farrel Gordon  Pager: [phone]       After Hours (After 5p/  First Contact Pager: [phone]  weekends / holidays): Second Contact Pager: [phone]   Chief Complaint: generalized weakness   History of Present Illness:   Bradley Brandt is a 42 y/o male with past medical history of relapsing/remitting MS, Bipolar 1 disorder, GAD, and insomnia that presents with worsening generalized weakness.   Patient was found on the floor by his neighbor today. Patient reports worsening generalized weakness since his recent hospitalization resulting in several falls at home. He states that he last fell a few days ago while getting out of bed. States that he landed on his left side. Reports that his falls have been due to his weakness and denies lightheadedness or dizziness prior to falling. Denies LOC or hitting his head. Patient reports worsening bilateral blurry vision since being discharged. He also endorses generalized abdominal pain for 2 days, intermittent central non-radiating "stabbing" chest pain over the last few days, and intermittent right sided headache over the last 2 weeks.   Patient denies difficulty speaking, difficulty word-finding, fever, chills, ST, cough, n/v/d, dysuria, syncope, or LE swelling.   Of note, patient was discharged from [facility] on 1/30 after being hospitalized for MS flare. Patient was seen by his neurologist Dr. Felecia Shelling in clinic on 2/8 with plan for 1X day of IV steroids followed by 1000 mg prednisone daily for 3 days. Also discussed seeing if patient would qualify for PACE as he has no help at home. He receives monthly Natalizumab  injections.  ED Course:  - CMP significant for elevated BUN (23), low total protein (5.1), low albumin (2.9), elevated AST (147), elevated ALT (67) - CBC significant for macrocytic anemia w/ Hgb 8.6 and thrombocytopenia at 108 - CK elevated at 8,386 - Mg WNL - Pending UA - CT abd/pelvis negative for acute findings (notable for left nephrolithiasis) - 1 mg ativan administered - 1L NS bolus - Neurology consult  Review of Systems: A complete ROS was negative except as per HPI.   Meds:  -Acetaminophen 500 mg q8 PRN -Baclofen 20 mg TID -Duloxetine 30 mg daily -Gabapentin 800 mg BID -Lidocaine patch -Natalizumab 300 mg injection q28 days -Quetiapine 200 mg daily -Vitamin D 50000 unit q7 days  Allergies: Allergies as of 07/30/2022 - Review Complete 07/30/2022  Allergen Reaction Noted   Lithium Rash 01/02/2013   Past Medical History:  Diagnosis Date   Bipolar 1 disorder ([place])    Bipolar disorder, unspecified ([place]) 01/03/2013   MS (multiple sclerosis) ([place]) dx 2006   relapsing-remitting right sided weakness   Neuromuscular disorder ([place])    MS    Family History: History limited by somnolence.   Social History: Patient lives at home alone. Until recently, he was able to dress himself, bathe himself, and cook for himself. Has been using wheelchair since recent discharge on 1/30. History limited by somnolence.   Physical Exam: Blood pressure (!) 175/62, pulse (!) 55, temperature 98  F (36.7 C), temperature source Oral, resp. rate 11, SpO2 100 %. Constitutional: disheveled, appears uncomfortable, lying on his left side in bed, drowsy, speaking slowly and softly HENT: Normocephalic and atraumatic. Dry mucous membranes. Eyes: EOMI. PERRL. No nystagmus.  Neck: Normal range of motion.  Cardiovascular: Regular rate, regular rhythm. No murmurs, rubs, or gallops. Normal radial and PT pulses bilaterally. No LE edema.  Pulmonary: Normal respiratory effort. No wheezes, rales, rhonchi, or  crackles.   Abdominal: Soft. Non-distended. Normal bowel sounds. RUQ/RLQ tenderness. No rebound tenderness or guarding.  Musculoskeletal: No bony abnormalities noted. No chest tenderness.  Neurological: Alert and oriented to person, place, and time. Unable to perform finger-to-nose or heel-to-shin bilaterally likely secondary to generalized weakness. 1/5 grip strength of the LUE. 3/5 grip strength of the RUE. 1/5 strength of bilateral LE. Cranial nerves intact. Sensation intact.  Skin: warm and dry. Chronic dry skin changes of bilateral feet without ulcers. No ulcers of the lower back, buttocks, or hips. Decreased skin turgor.   EKG: none obtained  CXR: personally reviewed my interpretation is negative for acute cardiopulmonary disease.   Assessment & Plan by Problem: Principal Problem:   Rhabdomyolysis  Bradley Brandt is a 42 y/o male with past medical history of relapsing/remitting MS, Bipolar 1 disorder, GAD, and insomnia that presents with worsening generalized weakness and admitted for rhabdomyolysis.    #Rhabdomyolysis #Generalized weakness Patient has history of MS w/ multiple recent falls in the setting of worsening generalized weakness. Found on the ground by his neighbor on day of admission. CK 8,386 on admission. No signs of AKI on CMP. Ethanol level WNL. Lactic acid WNL. VBG demonstrates pH 7.45, pCO2 34.7, and bicarb of 24.3. Suspect elevated CK likely secondary to rhabdomyolysis in the setting of prolonged immobilization and recent falls. Suspect that his generalized weakness is multifactorial and that rhabdomyolysis may be contributing. Will reassess his strength as we treat this condition. Patient received 1L NS bolus in the ED. Will continue with IV fluids and monitor urine output.   - 1L LR bolus - Strict Is/Os - Trend CK - Trend CMP - Mg/phos level - Rapid urine drug screen - Uric acid level - TSH  #History of relapsing/remitting MS Patient has a history of relapsing  and remitting MS. Recently discharged from [facility] on 1/30 after being hospitalized for MS flare. MRI brain from this hospitalization consistent w/ MS demonstrating several new lesions w/ associated enhancement suggesting active demyelination. MRI of the thoracic spine demonstrated active demyelination at T5/T7-T8. Treated w/ IV solumedrol 1000 mg for 5 days. Was not a candidate for CIR and insurance did not approve SNF. He was discharged home w/ wheelchair. Patient was seen by his neurologist Dr. Felecia Shelling in clinic on 2/8 with plan for 1X day of IV steroids followed by 1000 mg prednisone daily for 3 days. Also discussed seeing if patient would qualify for PACE as he has no help at home. He receives monthly Natalizumab injections with last dose on 2/7. Also on Baclofen 20 mg TID and Gabapentin 800 mg BID at home. Patient presents now w/ worsening generalized weakness resulting in multiple falls, worsening bilateral blurry vision, and intermittent HA. Neurology was consulted by ED provider and recommended medical workup at this time given laboratory abnormalities. On exam, patient has 1/5 grip strength of the LUE, 3/5 grip strength of the RUE, and 1/5 strength of bilateral LE. Cranial nerves and sensation intact. Sensation intact. Unable to perform finger-to-nose or heel-to-shin bilaterally likely secondary to generalized weakness.  Suspect that his generalized weakness may be multifactorial in the setting of anemia and rhabdomyolysis. If symptoms fail to improve with treatment of these conditions, will reconsider MS flare as a potential cause of his weakness and reassess need for steroids/neuro consult.   - Monitor clinically - PT/OT eval - Continue Baclofen 20 mg TID and Gabapentin 800 mg BID  #Macrocytic anemia #Thrombocytopenia CBC on admission significant for macrocytic anemia w/ Hgb 8.6 and thrombocytopenia at 108. PT/INR and reticulocyte count WNL. Neurology note discussed rare case reports of ITP w/  Natalizumab use however unlikely given timing of most recent treatment.  - Folate level - B12 level - LDH - Haptoglobin  - Blood smear  #Abdominal pain Patient endorses generalized abdominal pain over the last 2 days. Reports that he has not eaten in 2 days. Denies diarrhea. CT abd/pelvis negative for acute findings but notable for left nephrolithiasis. On exam, patient has RUQ and RLQ tenderness without rebound tenderness or guarding. Suspect that his abdominal pain may be secondary to constipation or hunger. - Continue to monitor clinically - Tylenol PRN for pain  - Zofran PRN for nausea - Senna  #Chest pain Patient reports intermittent central non-radiating "stabbing" chest pain over the last few days. No reproducible chest tenderness on exam. CXR negative for acute abnormalities. No EKG or troponin ordered in the ED. - Tylenol PRN for pain - EKG - Troponin  #Bipolar 1 Disorder #GAD #Insomnia Patient has history of Bipolar 1 disorder. On duloxetine 30 mg daily and quetiapine 200 mg daily at home.  - Continue duloxetine 30 mg daily and quetiapine 200 mg daily   #Vitamin D Deficiency Has history of Vitamin D deficiency. On Vitamin D 50000 unit q7 days at home.  - Will hold vitamin D for now   Diet: Regular Bowel: Senna VTE: SCDs IVF: 1L LR bolus Code: Full   Prior to Admission Living Arrangement: home by himself Anticipated Discharge Location: TBD Barriers to Discharge: continued management  Dispo: Admit patient to Inpatient with expected length of stay greater than 2 midnights.  Signed: Starlyn Skeans, MD 07/30/2022, 5:34 AM  Pager: [phone] After 5pm on weekdays and 1pm on weekends: On Call pager: [phone]

## 2022-07-30 NOTE — ED Notes (Signed)
Pt given urinal - aware we need sample

## 2022-07-30 NOTE — Plan of Care (Signed)
Briefly discussed with Dr. Roxanne Mins.  This is a patient with relapsing remitting MS and bipolar disorder.  He has unfortunately had spotty adherence with Tysabri (doses on 10/7, 11/3 and 2/7 per outpatient neurology notes).    He was recently admitted with concern for rebound MS flare in the setting of missed Tysabri on 07/11/2022 with imaging demonstrating active demyelinating lesions and received a full 5 days of Solu-Medrol.  At completion of steroids he was noted to have neurological examination notable for slight decrease sensation in right V1 and V2 distribution in the face and 4+/5 left upper and lower extremity weakness, 5/5 on the right.    Subsequently on follow-up with on 2/8 outpatient neurology he was noted to have some worsening bilateral lower extremity weakness and found to be unable to walk (right upper extremity documented 5/5, left upper extremity 4+/5, right lower extremity 4 -/5, left lower extremity 4/5).  He was treated with 1 additional day of IV steroids and given a prescription for 3 days of 1000 mg daily oral steroids.  There was a patient message on 2/9 from his father stating that 21 pills of the 50 mg pills had spilled in urine; unclear if he completed the subsequent oral steroids  Dr. Colin Broach examination patient has profound worsening weakness, though the left is slightly worse than the right.  However initial laboratory values at this time are notable for hemoglobin drop from 16-8.8 and platelet dropped from 231-108 with CK elevation of 8386  Based on this brief chart review and examination as described to me by ED provider, as well as the limited laboratory data above, recommending medical workup at this time,  There are rare case reports of Tysabri associated immune thrombocytopenic purpura; this is a rare complication and is typically seen 2 to 3 weeks after treatment (he received his last dose on 2/7 so feel that this is unlikely to be the etiology); defer workup to primary  team/ED If medical workup is unrevealing or other neurological questions/concerns arise please do reach out to neurology for full consultation, please note these are only brief curbside recommendations

## 2022-07-30 NOTE — Progress Notes (Cosign Needed Addendum)
NAME:  Bradley QUARTERMAN, MRN:  [id number], DOB:  27-Apr-1981, LOS: 0 ADMISSION DATE:  07/30/2022  Subjective  Patient evaluated at bedside this PM. Patient reports that he overall feels week and has some soreness in his left leg. Remembers being dizzy and falling out of wheelchair, being down overnight before someone was able to help him up. Otherwise mentions he isn't doing well overall and is frustrated that his MS keeps progressing.   Objective   Blood pressure (!) 152/67, pulse (!) 54, temperature 98.3 F (36.8 C), temperature source Oral, resp. rate 15, SpO2 100 %.     Intake/Output Summary (Last 24 hours) at 07/30/2022 1652 Last data filed at 07/30/2022 1211 Gross per 24 hour  Intake 1082.25 ml  Output 700 ml  Net 382.25 ml   There were no vitals filed for this visit.  Physical Exam: General: Resting comfortably in bed, no acute distress CV: Regular rate, rhythm. No murmurs appreciated. Warm extremities.   Pulm: Normal work of breathing on room air. Clear to auscultation bilaterally.  Abdomen: Soft, non-tender, non-distended. Normoactive bowel sounds. MSK: Normal bulk, tone. No peripheral edema. Skin: Warm, dry. No rashes, petechiae, purpura appreciated. Neuro: Awake, alert, conversing appropriately. Cranial nerves grossly in tact. 1/5 strength of upper and lower left extremities. 3/5 strength of upper and lower right extremities.   Psych: Flat affect, normal mood.   Labs       Latest Ref Rng & Units 07/30/2022    6:18 AM 07/30/2022    1:45 AM 07/27/2022   10:57 AM  CBC  WBC 4.0 - 10.5 K/uL  6.9  7.5   Hemoglobin 13.0 - 17.0 g/dL 13.9  8.6  16.7   Hematocrit 39.0 - 52.0 % 41.0  27.3  51.1   Platelets 150 - 400 K/uL  108  231       Latest Ref Rng & Units 07/30/2022    6:18 AM 07/30/2022    1:45 AM 07/17/2022    2:17 AM  BMP  Glucose 70 - 99 mg/dL  88  132   BUN 6 - 20 mg/dL  23  20   Creatinine 0.61 - 1.24 mg/dL  1.06  1.00   Sodium 135 - 145 mmol/L 141  143  139    Potassium 3.5 - 5.1 mmol/L 3.8  3.8  4.3   Chloride 98 - 111 mmol/L  113  106   CO2 22 - 32 mmol/L  19  23   Calcium 8.9 - 10.3 mg/dL  7.9  9.0     Summary   Bradley Brandt is 42yo person with relapsing/remitting multiple sclerosis, bipolar 1 disorder, generalized anxiety disorder, and insomnia admitted 2/11 for rhabdomyolysis and generalized deconditioning likely related to progression of multiple sclerosis.   Assessment & Plan:  Principal Problem:   Rhabdomyolysis  #Rhabdomyolysis Patient did have fall from wheelchair and was down on the ground overnight until his neighbor was able to help him up. We have been hydrating him with maintenance fluids throughout the day, CK 8k>7k after a few boluses this morning. Pending repeat now.  - LR 150/h - CK level every 6 hours - Daily BMP  #History of relapsing/remitting multiple sclerosis #Declining functional status Suspect his generalized weakness prior to his fall yesterday likely is from progression of his MS. He reports he had been taking his prednisone at home as instructed, but he continued to be extremely weak. Neurology was initially consulted in the ED for this, however will plan to  treat rhabdomyolysis initially and if symptoms are not improved we will reach back out. - PT/OT - Continue home baclofen and gabapentin  #Acute normocytic anemia Hgb 16>8 from discharge to arrival overnight. Unclear etiology, he denies any symptoms of GI bleed and CT was unremarkable on admission. Also denies hematuria. No focal symptoms or head trauma concerning for head bleed. No respiratory symptoms. LDH mildly elevated, we have not been able to get other follow-up labs to determine for possible hemolysis. Anemia could be contributing to his overall weakness.  - Follow-up evening H/H - Follow-up blood smear, haptoglobin for possible hemolysis - Follow-up CBC, CMP, folate in AM - Hold chemical VTE prophylaxis  #Abdominal pain This afternoon, patient  did not report any abdominal pain or GI symptoms. On exam his abdomen is soft and non-tender this afternoon. Will continue with bowel regimen and zofran as needed for symptomatic treatment. - Daily Senna - Zofran as needed  #Bipolar 1 disorder #Generalized anxiety disorder - Continue home duloxetine and quetiapine  Best practice:  DIET: Regular  IVF: n/a DVT PPX: SCD BOWEL: senna CODE: FULL FAM COM: n/a  Sanjuan Dame, MD Internal Medicine Resident PGY-3 PAGER: [phone] 07/30/2022 4:52 PM  If after hours (below), please contact on-call pager: [phone] 5PM-7AM Monday-Friday 1PM-7AM Saturday-Sunday

## 2022-07-30 NOTE — ED Triage Notes (Signed)
Per EMS, pt lives alone and neighbor found him on the floor which apparently happens quite often.  Family reports his MS has been causing him to decline especially over the past month.  Two weeks ago pt was able to move left side now he is not and his speech has also declined.  They are unable to control his pain as well.    127/70 HR 56 100% RA 16RR CBG 117  Family says he lives by himself and is unable to care for himself.  They are trying to get a in home RN however that has been a slow process.

## 2022-07-30 NOTE — ED Notes (Signed)
Patient resting in bed with eyes closed, no s/s of distress, will continue to monitor.  

## 2022-07-30 NOTE — Evaluation (Signed)
Physical Therapy Evaluation Patient Details Name: Bradley Brandt MRN: [id number] DOB: 1981/02/15 Today's Date: 07/30/2022  History of Present Illness  Pt is a 42 y.o. male presents to [facility] hospital on 2/11 with worsening generalized weakness.  Admitted for rhabdomyolysis. Noted pt with hx of MS and multiple recent falls, recently dc'd for [facility] 1/30 for MS flare. PMH includes relapsing remitting MS, bipolar disorder, GAD, insomnia.  Clinical Impression  Pt dependent with all mobility due to severe weakness and pain in BLE's. Pt significantly weaker than when seen here 2-3 weeks ago. Little support at home and pt has been falling 2x/day since recent dc from hospital. Recommend SNF for further rehab at DC. If MS symptoms improve he should be able to make steady progress.        Recommendations for follow up therapy are one component of a multi-disciplinary discharge planning process, led by the attending physician.  Recommendations may be updated based on patient status, additional functional criteria and insurance authorization.  Follow Up Recommendations Skilled nursing-short term rehab (<3 hours/day) Can patient physically be transported by private vehicle: No    Assistance Recommended at Discharge Frequent or constant Supervision/Assistance  Patient can return home with the following  Two people to help with walking and/or transfers;Two people to help with bathing/dressing/bathroom;Assistance with cooking/housework;Assistance with feeding;Direct supervision/assist for medications management;Assist for transportation;Direct supervision/assist for financial management;Help with stairs or ramp for entrance    Equipment Recommendations Hospital bed;Other (comment) (hoyer lift)  Recommendations for Other Services       Functional Status Assessment Patient has had a recent decline in their functional status and demonstrates the ability to make significant improvements in function in a reasonable  and predictable amount of time.     Precautions / Restrictions Precautions Precautions: Fall Restrictions Weight Bearing Restrictions: No      Mobility  Bed Mobility Overal bed mobility: Needs Assistance Bed Mobility: Rolling Rolling: Max assist         General bed mobility comments: attempted EOB but with initation of LB movement, pt became vocal and in pain; unable to tolerate.  Rolling in bed with max assist, long sitting with mod assist +2.    Transfers                   General transfer comment: deferred    Ambulation/Gait                  Stairs            Wheelchair Mobility    Modified Rankin (Stroke Patients Only)       Balance Overall balance assessment: Needs assistance Sitting-balance support: Single extremity supported Sitting balance-Leahy Scale: Zero Sitting balance - Comments: long sitting with external support required                                     Pertinent Vitals/Pain Pain Assessment Pain Assessment: Faces Faces Pain Scale: Hurts worst Pain Location: B LEs with movement Pain Descriptors / Indicators: Pins and needles, Sharp, Moaning Pain Intervention(s): Limited activity within patient's tolerance, Monitored during session, Repositioned    Home Living Family/patient expects to be discharged to:: Private residence Living Arrangements: Alone Available Help at Discharge: Family;Available PRN/intermittently Type of Home: House Home Access: Ramped entrance       Home Layout: One level Home Equipment: Rollator (4 wheels);Cane - single point;Grab bars - tub/shower;Shower seat;Wheelchair -  manual      Prior Function Prior Level of Function : Needs assist             Mobility Comments: Pt reports falling at least 2x/day since dc'd home, trying to "make it work".  Very unclear how he is mobilitizing.  in Jan admission pt reports utilizing cane versus rollator. ADLs Comments: independent ADLs,  but difficulty managing.  prior admission pt reported Sister assists with cooking, cleaning, and transportation but when asked to confirm pt reports he has not been getting much help.     Hand Dominance   Dominant Hand: Right    Extremity/Trunk Assessment   Upper Extremity Assessment Upper Extremity Assessment: Defer to OT evaluation    Lower Extremity Assessment Lower Extremity Assessment: RLE deficits/detail;LLE deficits/detail RLE Deficits / Details: grossly 1/5 LLE Deficits / Details: grossly 1/5       Communication   Communication: Expressive difficulties (dysarthria)  Cognition Arousal/Alertness: Lethargic Behavior During Therapy: Flat affect Overall Cognitive Status: Impaired/Different from baseline Area of Impairment: Problem solving, Following commands, Memory, Attention, Safety/judgement, Awareness                   Current Attention Level: Focused Memory: Decreased recall of precautions, Decreased short-term memory Following Commands: Follows one step commands consistently, Follows one step commands inconsistently Safety/Judgement: Decreased awareness of safety, Decreased awareness of deficits Awareness: Emergent Problem Solving: Slow processing, Decreased initiation, Difficulty sequencing, Requires verbal cues, Requires tactile cues General Comments: pt lethargic from ativan.  He presents with decreased initation and slow processing.  Pt aware he needs more assist at home but just doesnt have help. Pt became easily frustrated with attempts to EOB due to pain.        General Comments      Exercises     Assessment/Plan    PT Assessment Patient needs continued PT services  PT Problem List Decreased strength;Decreased range of motion;Decreased activity tolerance;Decreased balance;Decreased mobility;Decreased cognition;Pain       PT Treatment Interventions DME instruction;Functional mobility training;Therapeutic activities;Therapeutic exercise;Balance  training;Neuromuscular re-education;Cognitive remediation;Patient/family education;Wheelchair mobility training    PT Goals (Current goals can be found in the Care Plan section)  Acute Rehab PT Goals Patient Stated Goal: not stated PT Goal Formulation: With patient Time For Goal Achievement: 08/13/22 Potential to Achieve Goals: Fair    Frequency Min 3X/week     Co-evaluation PT/OT/SLP Co-Evaluation/Treatment: Yes Reason for Co-Treatment: For patient/therapist safety;To address functional/ADL transfers PT goals addressed during session: Mobility/safety with mobility OT goals addressed during session: ADL's and self-care       AM-PAC PT "6 Clicks" Mobility  Outcome Measure Help needed turning from your back to your side while in a flat bed without using bedrails?: A Lot Help needed moving from lying on your back to sitting on the side of a flat bed without using bedrails?: Total Help needed moving to and from a bed to a chair (including a wheelchair)?: Total Help needed standing up from a chair using your arms (e.g., wheelchair or bedside chair)?: Total Help needed to walk in hospital room?: Total Help needed climbing 3-5 steps with a railing? : Total 6 Click Score: 7    End of Session   Activity Tolerance: Patient limited by pain Patient left: in bed;with call bell/phone within reach Nurse Communication: Mobility status PT Visit Diagnosis: Other abnormalities of gait and mobility (R26.89);Pain;Repeated falls (R29.6);Muscle weakness (generalized) (M62.81) Pain - Right/Left:  (bil) Pain - part of body: Leg    Time:  [id number] PT Time Calculation (min) (ACUTE ONLY): 16 min   Charges:   PT Evaluation $PT Eval Moderate Complexity: [place] 07/30/2022, 4:04 PM

## 2022-07-30 NOTE — ED Notes (Signed)
ED TO INPATIENT HANDOFF REPORT  ED Nurse Name and Phone #: Duanne Guess [phone]  S Name/Age/Gender Bradley Brandt 42 y.o. male Room/Bed: 040C/040C  Code Status   Code Status: Full Code  Home/SNF/Other Rehab Patient oriented to: self, place, time, and situation Is this baseline? Yes   Triage Complete: Triage complete  Chief Complaint Rhabdomyolysis [M62.82]  Triage Note Per EMS, pt lives alone and neighbor found him on the floor which apparently happens quite often.  Family reports his MS has been causing him to decline especially over the past month.  Two weeks ago pt was able to move left side now he is not and his speech has also declined.  They are unable to control his pain as well.    127/70 HR 56 100% RA 16RR CBG 117  Family says he lives by himself and is unable to care for himself.  They are trying to get a in home RN however that has been a slow process.    Allergies Allergies  Allergen Reactions   Lithium Rash    Level of Care/Admitting Diagnosis ED Disposition     ED Disposition  Admit   Condition  --   Comment  Hospital Area: [facility] [100100]  Level of Care: Telemetry Medical [104]  May admit patient to [facility] or [facility] if equivalent level of care is available:: No  Covid Evaluation: Asymptomatic - no recent exposure (last 10 days) testing not required  Diagnosis: Rhabdomyolysis [728.88.ICD-9-CM]  Admitting Physician: Angelica Pou [place]  Attending Physician: Angelica Pou [id number]  Certification:: I certify this patient will need inpatient services for at least 2 midnights  Estimated Length of Stay: 3          B Medical/Surgery History Past Medical History:  Diagnosis Date   Bipolar 1 disorder ([place])    Bipolar disorder, unspecified ([place]) 01/03/2013   MS (multiple sclerosis) ([place]) dx 2006   relapsing-remitting right sided weakness   Neuromuscular disorder ([place])    MS   Past Surgical  History:  Procedure Laterality Date   TUMOR REMOVAL  1983   abdomen benign     A IV Location/Drains/Wounds Patient Lines/Drains/Airways Status     Active Line/Drains/Airways     Name Placement date Placement time Site Days   Peripheral IV 07/30/22 20 G 1" Anterior;Proximal;Right Forearm 07/30/22  0125  Forearm  less than 1   External Urinary Catheter 07/30/22  0637  --  less than 1            Intake/Output Last 24 hours  Intake/Output Summary (Last 24 hours) at 07/30/2022 1434 Last data filed at 07/30/2022 1211 Gross per 24 hour  Intake 1082.25 ml  Output 700 ml  Net 382.25 ml    Labs/Imaging Results for orders placed or performed during the hospital encounter of 07/30/22 (from the past 48 hour(s))  Comprehensive metabolic panel     Status: Abnormal   Collection Time: 07/30/22  1:45 AM  Result Value Ref Range   Sodium 143 135 - 145 mmol/L   Potassium 3.8 3.5 - 5.1 mmol/L   Chloride 113 (H) 98 - 111 mmol/L   CO2 19 (L) 22 - 32 mmol/L   Glucose, Bld 88 70 - 99 mg/dL    Comment: Glucose reference range applies only to samples taken after fasting for at least 8 hours.   BUN 23 (H) 6 - 20 mg/dL   Creatinine, Ser 1.06 0.61 - 1.24 mg/dL   Calcium  7.9 (L) 8.9 - 10.3 mg/dL   Total Protein 5.1 (L) 6.5 - 8.1 g/dL   Albumin 2.9 (L) 3.5 - 5.0 g/dL   AST 147 (H) 15 - 41 U/L   ALT 67 (H) 0 - 44 U/L   Alkaline Phosphatase 55 38 - 126 U/L   Total Bilirubin 1.0 0.3 - 1.2 mg/dL   GFR, Estimated >60 >60 mL/min    Comment: (NOTE) Calculated using the CKD-EPI Creatinine Equation (2021)    Anion gap 11 5 - 15    Comment: Performed at [facility] [address] [place], [place] [address]  CBC with Differential     Status: Abnormal   Collection Time: 07/30/22  1:45 AM  Result Value Ref Range   WBC 6.9 4.0 - 10.5 K/uL   RBC 2.63 (L) 4.22 - 5.81 MIL/uL   Hemoglobin 8.6 (L) 13.0 - 17.0 g/dL   HCT 27.3 (L) 39.0 - 52.0 %   MCV 103.8 (H) 80.0 - 100.0 fL   MCH 32.7 26.0 -  34.0 pg   MCHC 31.5 30.0 - 36.0 g/dL   RDW 14.2 11.5 - 15.5 %   Platelets 108 (L) 150 - 400 K/uL   nRBC 0.6 (H) 0.0 - 0.2 %   Neutrophils Relative % 70 %   Neutro Abs 4.8 1.7 - 7.7 K/uL   Lymphocytes Relative 21 %   Lymphs Abs 1.4 0.7 - 4.0 K/uL   Monocytes Relative 9 %   Monocytes Absolute 0.7 0.1 - 1.0 K/uL   Eosinophils Relative 0 %   Eosinophils Absolute 0.0 0.0 - 0.5 K/uL   Basophils Relative 0 %   Basophils Absolute 0.0 0.0 - 0.1 K/uL   Immature Granulocytes 0 %   Abs Immature Granulocytes 0.03 0.00 - 0.07 K/uL    Comment: Performed at [facility], [address], [place], [place] [address]  CK     Status: Abnormal   Collection Time: 07/30/22  1:45 AM  Result Value Ref Range   Total CK 8,386 (H) 49 - 397 U/L    Comment: RESULT CONFIRMED BY MANUAL DILUTION Performed at [facility], [place] [address] [place], [place] [address]   Magnesium     Status: None   Collection Time: 07/30/22  1:45 AM  Result Value Ref Range   Magnesium 2.1 1.7 - 2.4 mg/dL    Comment: Performed at [facility] [address] [place], [place] [address]  Urinalysis, Routine w reflex microscopic -Urine, Clean Catch     Status: Abnormal   Collection Time: 07/30/22  1:46 AM  Result Value Ref Range   Color, Urine YELLOW YELLOW   APPearance HAZY (A) CLEAR   Specific Gravity, Urine >1.046 (H) 1.005 - 1.030   pH 6.0 5.0 - 8.0   Glucose, UA NEGATIVE NEGATIVE mg/dL   Hgb urine dipstick MODERATE (A) NEGATIVE   Bilirubin Urine NEGATIVE NEGATIVE   Ketones, ur NEGATIVE NEGATIVE mg/dL   Protein, ur NEGATIVE NEGATIVE mg/dL   Nitrite NEGATIVE NEGATIVE   Leukocytes,Ua MODERATE (A) NEGATIVE   RBC / HPF 6-10 0 - 5 RBC/hpf   WBC, UA >50 0 - 5 WBC/hpf   Bacteria, UA NONE SEEN NONE SEEN   Squamous Epithelial / HPF 0-5 0 - 5 /HPF   Mucus PRESENT     Comment: Performed at [facility], [place] [address], [place], [place] [address]  POC occult blood, ED Provider will collect  Status: None    Collection Time: 07/30/22  4:05 AM  Result Value Ref Range   Fecal Occult Bld NEGATIVE NEGATIVE  Type and screen     Status: None   Collection Time: 07/30/22  4:10 AM  Result Value Ref Range   ABO/RH(D) A POS    Antibody Screen NEG    Sample Expiration      08/02/2022,2359 Performed at [facility], [place] [address], [place], [place] [address]   ABO/Rh     Status: None   Collection Time: 07/30/22  4:15 AM  Result Value Ref Range   ABO/RH(D)      A POS Performed at [facility] [address] [place], [place] [address]   Reticulocytes     Status: None   Collection Time: 07/30/22  6:08 AM  Result Value Ref Range   Retic Ct Pct 1.0 0.4 - 3.1 %    Comment: ACCURACY OF RESULTS QUESTIONABLE. RECOMMEND RECOLLECT TO VERIFY.  SEE [id number]    RBC. 4.51 4.22 - 5.81 MIL/uL    Comment: ACCURACY OF RESULTS QUESTIONABLE. RECOMMEND RECOLLECT TO VERIFY.  SEE [id number]    Retic Count, Absolute 46.9 19.0 - 186.0 K/uL    Comment: ACCURACY OF RESULTS QUESTIONABLE. RECOMMEND RECOLLECT TO VERIFY.  SEE [id number]    Immature Retic Fract 4.9 2.3 - 15.9 %    Comment: ACCURACY OF RESULTS QUESTIONABLE. RECOMMEND RECOLLECT TO VERIFY.  SEE [id number] Performed at [facility], [place] [address], [place], [place] [address]   TSH     Status: None   Collection Time: 07/30/22  6:09 AM  Result Value Ref Range   TSH 1.197 0.350 - 4.500 uIU/mL    Comment: Performed by a 3rd Generation assay with a functional sensitivity of <=0.01 uIU/mL. Performed at [facility], [place] [address], [place], [place] [address]   Lactate dehydrogenase     Status: Abnormal   Collection Time: 07/30/22  6:09 AM  Result Value Ref Range   LDH 395 (H) 98 - 192 U/L    Comment: Performed at [facility], [place] [address] [place], [place] [address]  Ethanol     Status: None   Collection Time: 07/30/22  6:09 AM  Result Value Ref Range   Alcohol, Ethyl (B) <10 <10 mg/dL    Comment: (NOTE) Lowest detectable limit  for serum alcohol is 10 mg/dL.  For medical purposes only. Performed at [facility], [place] [address], [place], [place] [address]   Protime-INR     Status: None   Collection Time: 07/30/22  6:09 AM  Result Value Ref Range   Prothrombin Time 13.9 11.4 - 15.2 seconds   INR 1.1 0.8 - 1.2    Comment: (NOTE) INR goal varies based on device and disease states. Performed at [facility], [place] [address] [place],  [address]   Phosphorus     Status: None   Collection Time: 07/30/22  6:09 AM  Result Value Ref Range   Phosphorus 2.9 2.5 - 4.6 mg/dL    Comment: Performed at [facility] [address] [place], [place] [address]  Lactic acid, plasma     Status: None   Collection Time: 07/30/22  6:09 AM  Result Value Ref Range   Lactic Acid, Venous 1.5 0.5 - 1.9 mmol/L    Comment: Performed at [facility] [address], [place], [place] [address]  Troponin I (High Sensitivity)  Status: None   Collection Time: 07/30/22  6:09 AM  Result Value Ref Range   Troponin I (High Sensitivity) 17 <18 ng/L    Comment: (NOTE) Elevated high sensitivity troponin I (hsTnI) values and significant  changes across serial measurements may suggest ACS but many other  chronic and acute conditions are known to elevate hsTnI results.  Refer to the "Links" section for chest pain algorithms and additional  guidance. Performed at [facility], [place] [address], [place], [place] [address]   Uric acid     Status: None   Collection Time: 07/30/22  6:09 AM  Result Value Ref Range   Uric Acid, Serum 3.9 3.7 - 8.6 mg/dL    Comment: HEMOLYSIS AT THIS LEVEL MAY AFFECT RESULT Performed at [facility], [place] [address], [place], [place] [address]   Vitamin B12     Status: None   Collection Time: 07/30/22  6:09 AM  Result Value Ref Range   Vitamin B-12 692 180 - 914 pg/mL    Comment: (NOTE) This assay is not validated for testing neonatal or myeloproliferative syndrome  specimens for Vitamin B12 levels. Performed at [facility], [place] [address], [place], [place] [address]   I-Stat venous blood gas, ED     Status: Abnormal   Collection Time: 07/30/22  6:18 AM  Result Value Ref Range   pH, Ven 7.453 (H) 7.25 - 7.43   pCO2, Ven 34.7 (L) 44 - 60 mmHg   pO2, Ven 84 (H) 32 - 45 mmHg   Bicarbonate 24.3 20.0 - 28.0 mmol/L   TCO2 25 22 - 32 mmol/L   O2 Saturation 97 %   Acid-Base Excess 1.0 0.0 - 2.0 mmol/L   Sodium 141 135 - 145 mmol/L   Potassium 3.8 3.5 - 5.1 mmol/L   Calcium, Ion 1.10 (L) 1.15 - 1.40 mmol/L   HCT 41.0 39.0 - 52.0 %   Hemoglobin 13.9 13.0 - 17.0 g/dL   Sample type VENOUS   Troponin I (High Sensitivity)     Status: Abnormal   Collection Time: 07/30/22  7:42 AM  Result Value Ref Range   Troponin I (High Sensitivity) 20 (H) <18 ng/L    Comment: (NOTE) Elevated high sensitivity troponin I (hsTnI) values and significant  changes across serial measurements may suggest ACS but many other  chronic and acute conditions are known to elevate hsTnI results.  Refer to the "Links" section for chest pain algorithms and additional  guidance. Performed at [facility], [place] [address] [place], [place] [address]   CK     Status: Abnormal   Collection Time: 07/30/22  7:42 AM  Result Value Ref Range   Total CK 7,729 (H) 49 - 397 U/L    Comment: RESULT CONFIRMED BY MANUAL DILUTION Performed at [facility], [place] [address] [place],  [address]    CT ABDOMEN PELVIS W CONTRAST  Result Date: 07/30/2022 CLINICAL DATA:  Found on floor. Weakness. Retroperitoneal bleed suspected. EXAM: CT ABDOMEN AND PELVIS WITH CONTRAST TECHNIQUE: Multidetector CT imaging of the abdomen and pelvis was performed using the standard protocol following bolus administration of intravenous contrast. RADIATION DOSE REDUCTION: This exam was performed according to the departmental dose-optimization program which includes automated exposure control,  adjustment of the mA and/or kV according to patient size and/or use of iterative reconstruction technique. CONTRAST:  37m OMNIPAQUE IOHEXOL 350 MG/ML SOLN COMPARISON:  None Available. FINDINGS: Lower chest:  No contributory findings.  Hepatobiliary: No focal liver abnormality.No evidence of biliary obstruction or stone. Pancreas: Unremarkable. Spleen: Unremarkable. Adrenals/Urinary Tract: Negative adrenals. No hydronephrosis or ureteral stone. 7 mm stone at the lower pole left kidney. Unremarkable bladder. Stomach/Bowel:  No obstruction. No appendicitis. Vascular/Lymphatic: No acute vascular abnormality. No mass or adenopathy. Reproductive:No pathologic findings. Other: No ascites or pneumoperitoneum. Musculoskeletal: No acute abnormalities. IMPRESSION: 1. No acute finding.  Negative for intra-abdominal hemorrhage. 2. Left nephrolithiasis. Electronically Signed   By: Jorje Guild M.D.   On: 07/30/2022 05:32   DG Chest Port 1 View  Result Date: 07/30/2022 CLINICAL DATA:  weakness EXAM: PORTABLE CHEST 1 VIEW COMPARISON:  Chest x-ray 07/11/2022 FINDINGS: The heart and mediastinal contours are within normal limits. No focal consolidation. No pulmonary edema. No pleural effusion. No pneumothorax. No acute osseous abnormality. IMPRESSION: No active disease. Electronically Signed   By: Iven Finn M.D.   On: 07/30/2022 02:37    Pending Labs Unresulted Labs (From admission, onward)     Start     Ordered   07/31/22 0500  CBC  Tomorrow morning,   R        07/30/22 0558   07/31/22 0500  Comprehensive metabolic panel  Tomorrow morning,   R        07/30/22 0558   07/31/22 0500  Magnesium  Tomorrow morning,   R        07/30/22 0558   07/30/22 1400  CK  Every 6 hours,   R      07/30/22 1031   07/30/22 0735  CBC with Differential/Platelet  Once,   STAT        07/30/22 0735   07/30/22 0735  Reticulocytes  Once,   STAT        07/30/22 0735   07/30/22 0735  Technologist smear review  Once,   STAT         07/30/22 0735   07/30/22 0600  Folate  Once,   R        07/30/22 0559   07/30/22 0538  Blood gas, venous  Once,   R        07/30/22 0537   07/30/22 0524  Lactic acid, plasma  STAT Now then every 3 hours,   R      07/30/22 0523   07/30/22 0521  Haptoglobin  Once,   R        07/30/22 0521   07/30/22 0518  Rapid urine drug screen (hospital performed)  ONCE - STAT,   STAT        07/30/22 0521   07/30/22 0515  CBC with Differential/Platelet  Once,   R        07/30/22 0521            Vitals/Pain Today's Vitals   07/30/22 1042 07/30/22 1100 07/30/22 1130 07/30/22 1145  BP:  138/62 (!) 130/57 (!) 144/57  Pulse:  (!) 57 (!) 52 (!) 48  Resp:  13 12 12  $ Temp: 98.3 F (36.8 C)     TempSrc: Oral     SpO2:  100% 100% 100%  PainSc:        Isolation Precautions No active isolations  Medications Medications  acetaminophen (TYLENOL) tablet 650 mg (has no administration in time range)    Or  acetaminophen (TYLENOL) suppository 650 mg (has no administration in time range)  senna-docusate (Senokot-S) tablet 1 tablet (has no administration in time range)  ondansetron (ZOFRAN) tablet 4 mg (has no administration in time range)  Or  ondansetron (ZOFRAN) injection 4 mg (has no administration in time range)  baclofen (LIORESAL) tablet 20 mg (20 mg Oral Given 07/30/22 1234)  DULoxetine (CYMBALTA) DR capsule 30 mg (30 mg Oral Given 07/30/22 1234)  gabapentin (NEURONTIN) capsule 800 mg (800 mg Oral Given 07/30/22 1234)  QUEtiapine (SEROQUEL) tablet 200 mg (has no administration in time range)  lactated ringers infusion (has no administration in time range)  sodium chloride 0.9 % bolus 1,000 mL (0 mLs Intravenous Stopped 07/30/22 0314)  LORazepam (ATIVAN) tablet 1 mg (1 mg Oral Given 07/30/22 0218)  iohexol (OMNIPAQUE) 350 MG/ML injection 75 mL (75 mLs Intravenous Contrast Given 07/30/22 0521)  lactated ringers bolus 1,000 mL (0 mLs Intravenous Stopped 07/30/22 0715)    Mobility non-ambulatory      Focused Assessments Neuro Assessment Handoff:  Swallow screen pass? Yes  Cardiac Rhythm: Sinus bradycardia (HR 58)       Neuro Assessment:   Neuro Checks:      Has TPA been given? No If patient is a Neuro Trauma and patient is going to OR before floor call report to [facility] nurse: [phone] or [phone]   R Recommendations: See Admitting Provider Note  Report given to:   Additional Notes: MS, left arm painful and stiff & right arm some mobility

## 2022-07-30 NOTE — ED Provider Notes (Addendum)
[facility] Provider Note   CSN: [id number] Arrival date & time: 07/30/22  0101     History  Chief Complaint  Patient presents with   Pain    Bradley Brandt is a 42 y.o. male.  The history is provided by the patient.  He has history of multiple sclerosis and was found on the floor by a neighbor.  He states that he has gotten significantly weaker over the last week.  He had been admitted to the hospital for a flareup of his multiple sclerosis last month.  He is concerned that he is getting to the point where he cannot manage his condition at home.  He denies fever or chills and denies pain.   Home Medications Prior to Admission medications   Medication Sig Start Date End Date Taking? Authorizing Provider  acetaminophen (TYLENOL) 500 MG tablet Take 500 mg by mouth every 8 (eight) hours as needed for moderate pain.    [provider]  baclofen (LIORESAL) 20 MG tablet Take 1 tablet (20 mg total) by mouth 3 (three) times daily. 07/22/22   Renee Rival, FNP  DULoxetine (CYMBALTA) 30 MG capsule Take 1 capsule (30 mg total) by mouth daily. 07/22/22   Paseda, Dewaine Conger, FNP  gabapentin (NEURONTIN) 800 MG tablet Take 1 tablet (800 mg total) by mouth 2 (two) times daily. 06/27/22   Sater, Nanine Means, MD  lidocaine (HM LIDOCAINE PATCH) 4 % Place 1 patch onto the skin daily. 10/12/21   Passmore, Jake Church I, NP  natalizumab (TYSABRI) 300 MG/15ML injection Inject 15 mLs (300 mg total) into the vein every 28 (twenty-eight) days. 09/05/16   Sater, Nanine Means, MD  predniSONE (DELTASONE) 50 MG tablet Additional pills to replace pills that are damaged.  Take as directed. 07/28/22   Sater, Nanine Means, MD  QUEtiapine (SEROQUEL) 200 MG tablet Take 1 tablet (200 mg total) by mouth at bedtime. Take a half tablet at night for 4 days then increase to a whole tablet each night. 07/22/22   Paseda, Dewaine Conger, FNP  Vitamin D, Ergocalciferol, (DRISDOL) 1.25 MG (50000  UNIT) CAPS capsule Take 1 capsule (50,000 Units total) by mouth every 7 (seven) days. 06/29/22   Sater, Nanine Means, MD      Allergies    Lithium    Review of Systems   Review of Systems  All other systems reviewed and are negative.   Physical Exam Updated Vital Signs BP 124/78 (BP Location: Right Arm)   Pulse (!) 54   Temp 98.2 F (36.8 C) (Oral)   Resp 18   SpO2 100%  Physical Exam Vitals and nursing note reviewed.   42 year old male, appears generally weak, but is in no acute distress. Vital signs are significant for slightly slow heart rate. Oxygen saturation is 100%, which is normal. Head is normocephalic and atraumatic. PERRLA, EOMI. Oropharynx is clear. Neck is nontender and supple without adenopathy or JVD. Back is nontender and there is no CVA tenderness. Lungs are clear without rales, wheezes, or rhonchi. Chest is nontender. Heart has regular rate and rhythm without murmur. Abdomen is soft, flat, nontender. Extremities have no cyanosis or edema. Skin is warm and dry without rash. Neurologic: Awake but with a very soft voice with slight dysarthria noted, no gross cranial nerve deficits.  There is generalized weakness which is worse on the left than on the right.  ED Results / Procedures / Treatments   Labs (all labs  ordered are listed, but only abnormal results are displayed) Labs Reviewed  COMPREHENSIVE METABOLIC PANEL - Abnormal; Notable for the following components:      Result Value   Chloride 113 (*)    CO2 19 (*)    BUN 23 (*)    Calcium 7.9 (*)    Total Protein 5.1 (*)    Albumin 2.9 (*)    AST 147 (*)    ALT 67 (*)    All other components within normal limits  CBC WITH DIFFERENTIAL/PLATELET - Abnormal; Notable for the following components:   RBC 2.63 (*)    Hemoglobin 8.6 (*)    HCT 27.3 (*)    MCV 103.8 (*)    Platelets 108 (*)    nRBC 0.6 (*)    All other components within normal limits  CK - Abnormal; Notable for the following components:    Total CK 8,386 (*)    All other components within normal limits  MAGNESIUM  URINALYSIS, ROUTINE W REFLEX MICROSCOPIC  CBC WITH DIFFERENTIAL/PLATELET  TSH  LACTATE DEHYDROGENASE  RAPID URINE DRUG SCREEN, HOSP PERFORMED  ETHANOL  PROTIME-INR  PHOSPHORUS  TECHNOLOGIST SMEAR REVIEW  HAPTOGLOBIN  LACTIC ACID, PLASMA  LACTIC ACID, PLASMA  URIC ACID  BLOOD GAS, VENOUS  FOLATE  VITAMIN B12  RETICULOCYTES  POC OCCULT BLOOD, ED  TYPE AND SCREEN  ABO/RH  TROPONIN I (HIGH SENSITIVITY)    EKG ED ECG REPORT   Date: 07/30/2022  Rate: 57  Rhythm: sinus bradycardia  QRS Axis: normal  Intervals: normal  ST/T Wave abnormalities: normal  Conduction Disutrbances:none  Narrative Interpretation: Sinus bradycardia, otherwise normal ECG.  When compared with ECG of 07/11/2022, heart rate has increased, PACs are no longer present.  Old EKG Reviewed: changes noted  I have personally reviewed the EKG tracing and agree with the computerized printout as noted.  Radiology CT ABDOMEN PELVIS W CONTRAST  Result Date: 07/30/2022 CLINICAL DATA:  Found on floor. Weakness. Retroperitoneal bleed suspected. EXAM: CT ABDOMEN AND PELVIS WITH CONTRAST TECHNIQUE: Multidetector CT imaging of the abdomen and pelvis was performed using the standard protocol following bolus administration of intravenous contrast. RADIATION DOSE REDUCTION: This exam was performed according to the departmental dose-optimization program which includes automated exposure control, adjustment of the mA and/or kV according to patient size and/or use of iterative reconstruction technique. CONTRAST:  26m OMNIPAQUE IOHEXOL 350 MG/ML SOLN COMPARISON:  None Available. FINDINGS: Lower chest:  No contributory findings. Hepatobiliary: No focal liver abnormality.No evidence of biliary obstruction or stone. Pancreas: Unremarkable. Spleen: Unremarkable. Adrenals/Urinary Tract: Negative adrenals. No hydronephrosis or ureteral stone. 7 mm stone at the lower  pole left kidney. Unremarkable bladder. Stomach/Bowel:  No obstruction. No appendicitis. Vascular/Lymphatic: No acute vascular abnormality. No mass or adenopathy. Reproductive:No pathologic findings. Other: No ascites or pneumoperitoneum. Musculoskeletal: No acute abnormalities. IMPRESSION: 1. No acute finding.  Negative for intra-abdominal hemorrhage. 2. Left nephrolithiasis. Electronically Signed   By: JJorje GuildM.D.   On: 07/30/2022 05:32   DG Chest Port 1 View  Result Date: 07/30/2022 CLINICAL DATA:  weakness EXAM: PORTABLE CHEST 1 VIEW COMPARISON:  Chest x-ray 07/11/2022 FINDINGS: The heart and mediastinal contours are within normal limits. No focal consolidation. No pulmonary edema. No pleural effusion. No pneumothorax. No acute osseous abnormality. IMPRESSION: No active disease. Electronically Signed   By: MIven FinnM.D.   On: 07/30/2022 02:37    Procedures Procedures    Medications Ordered in ED Medications  acetaminophen (TYLENOL) tablet 650 mg (has no  administration in time range)    Or  acetaminophen (TYLENOL) suppository 650 mg (has no administration in time range)  senna-docusate (Senokot-S) tablet 1 tablet (has no administration in time range)  ondansetron (ZOFRAN) tablet 4 mg (has no administration in time range)    Or  ondansetron (ZOFRAN) injection 4 mg (has no administration in time range)  baclofen (LIORESAL) tablet 20 mg (has no administration in time range)  DULoxetine (CYMBALTA) DR capsule 30 mg (has no administration in time range)  gabapentin (NEURONTIN) tablet 800 mg (has no administration in time range)  QUEtiapine (SEROQUEL) tablet 200 mg (has no administration in time range)  sodium chloride 0.9 % bolus 1,000 mL (0 mLs Intravenous Stopped 07/30/22 0314)  LORazepam (ATIVAN) tablet 1 mg (1 mg Oral Given 07/30/22 0218)  iohexol (OMNIPAQUE) 350 MG/ML injection 75 mL (75 mLs Intravenous Contrast Given 07/30/22 0521)  lactated ringers bolus 1,000 mL (1,000 mLs  Intravenous New Bag/Given 07/30/22 [id number])    ED Course/ Medical Decision Making/ A&P                             Medical Decision Making Amount and/or Complexity of Data Reviewed Labs: ordered. Radiology: ordered.  Risk Prescription drug management. Decision regarding hospitalization.   Worsening generalized weakness, likely exacerbation of multiple sclerosis.  I have reviewed his past records, and he was admitted to the hospital from 07/11/2022 through 07/18/2022 for multiple sclerosis exacerbation treated with methylprednisolone, follow-up with his neurologist on 07/27/2022 at which time he was given an additional 3 days of steroids.  At this point, I am uncertain whether this is just continuing exacerbation of multiple sclerosis.  I have ordered laboratory workup of CBC, comprehensive metabolic panel, urinalysis to look for electrolyte abnormality or occult UTI.  I have requested consultation with neurology for advice going forward.  Chest x-ray shows no active disease.  Have independently viewed the image, and agree with radiologist interpretation.  I have reviewed and interpreted his laboratory test, and my interpretation is anemia which is new with dramatic drop in hemoglobin compared with 2/8, mild thrombocytopenia which is new but probably not clinically significant, elevated transaminases of uncertain clinical significance but likely related to rhabdomyolysis, markedly elevated CK consistent with rhabdomyolysis.  Patient now has 2 clear etiologies for weakness separate from multiple sclerosis exacerbation.  MRIs of brain and spinal column had been ordered but are canceled with other etiology now present.  Urinalysis is still pending.  I did a rectal exam and noted normal sphincter tone, brown stool which was Hemoccult negative.  I have ordered a CT of abdomen and pelvis to look for occult retroperitoneal bleeding.  I have discussed case with Dr. Coy Saunas of internal medicine teaching service  who agrees to admit the patient.  CT scan shows no evidence of retroperitoneal bleed.  I have independently viewed the images, and agree with the radiologist interpretation.  I have reviewed and interpreted his electrocardiogram and my interpretation is sinus bradycardia with morphology not changed from prior but heart rate increased from prior.  CRITICAL CARE Performed by: Delora Fuel Total critical care time: 40 minutes Critical care time was exclusive of separately billable procedures and treating other patients. Critical care was necessary to treat or prevent imminent or life-threatening deterioration. Critical care was time spent personally by me on the following activities: development of treatment plan with patient and/or surrogate as well as nursing, discussions with consultants, evaluation of patient's response to  treatment, examination of patient, obtaining history from patient or surrogate, ordering and performing treatments and interventions, ordering and review of laboratory studies, ordering and review of radiographic studies, pulse oximetry and re-evaluation of patient's condition.  Final Clinical Impression(s) / ED Diagnoses Final diagnoses:  Weakness  Normochromic normocytic anemia  Thrombocytopenia ([place])  Non-traumatic rhabdomyolysis  Elevated transaminase level    Rx / DC Orders ED Discharge Orders     None         Delora Fuel, MD [id number] [phone]    Delora Fuel, MD [id number] [phone]

## 2022-07-30 NOTE — Hospital Course (Addendum)
Bradley Brandt is 42yo male with relapsing/remitting multiple sclerosis, bipolar 1 disorder, generalized anxiety disorder, and insomnia admitted for rhabdomyolysis and generalized deconditioning likely secondary to progression of MS.     #History of relapsing/remitting multiple sclerosis #Declining functional status Presented w/ worsening generalized weakness. Patient reports compliance w/ prednisone at home. Neuro saw the patient overnight and was concerned for incompletely treated Tysabri rebound MS relapse. Ordered stat MRI brain (demonstrates multifocal progressed/active demyelinating disease compared to last month most notably in left brainstem/right semiovale and new paranasal sinus inflammation), MRI C-spine (mild progression since 2022 w/o active demyelination, stable cervical spine degeneration, C5-C6 spinal stenosis), and MRI T-spine (stable demyelination, right T7 hemicord lesion is more swollen, new 2 cm area of enhancement at left paraspinal muscles overlying T3/T4 spinous process suspected to be post-traumatic vs infectious vs inflammatory myositis). Neuro also discontinued tylenol to trend fever curve. Will reach out to neurology today for further recommendations.  - F/u neuro recommendations - PT/OT - Continue home gabapentin 800 mg BID - Increase baclofen to 20 mg q6H for muscle spasms - Tylenol 650 mg q6 PRN for lower back/right hip pain  Patient finished 5-day course of IVIG yesterday. Weakness and pain continue to improve***.  Patient did not require oxycodone yesterday.  - Continue baclofen to 20 mg every 6 hours - Continue gabapentin 400 3 times daily - Oxycodone 5 mg every 4 hours as needed for severe pain - Tylenol as needed - PT/OT - Incentive spirometer -F/u w/ neurology as outpatient (Dr. Felecia Shelling)  #Fever Was 2 days w/o fever but developed fever of 100.20F overnight. No tylenol was given. No leukocytosis. Not ill-appearing, normal lung exam, neck supple. CXR negative. RVP  negative. Covid/influenza/RSV negative. UA not concerning for UTI. Blood cultures negative at 1 day. Low suspicion for CNS infection or pneumonia at this time. No empiric abx at this time given lack of sepsis or source of infection. Neuro performing LP today to assess for signs of CNS infection.  - Continue to trend fever curve/vital signs closely - F/u CSF cell count, gram stain, protein/glucose, fungal culture, meningitis/encephalitis panel  Experienced intermittent fevers during his hospitalization. Extensive infectious workup negative. Remains afebrile over the last few days. Potentially medication related. - Continue to trend fever curve/vital signs closely - Continue tylenol PRN - F/u JC virus  #Rhabdomyolysis, improving #Malnutrition CK continued to trend down. Cr remains WNL. No signs of volume overload on exam. Still has decreased PO intake secondary to debility from MS but eating some today. Will hold off on IV fluids for now. May restart if he begins to show signs of dehydration.  - CMP to assess albumin   #Acute normocytic anemia Hgb 8.6 on admission. Improved to 14.5 today. CT abd/pelvis unremarkable. No signs of head trauma or hematuria. LDH elevated at 395. Folate WNL. Blood smear unremarkable. Haptoglobin WNL. Initially suspected anemia may be contributing to generalized weakness, however, he continues to be weak despite normalization of Hgb. More likely secondary to his MS given recent MRI findings.   - Trend CBC   #Abdominal pain CT abd/pelvis unremarkable. Did have 400+ mL on bladder scan overnight. Patient failed I&O several times w/ plan for coude catheter which has been placed. No abdominal pain or n/v/d today. On exam, abdomen is non-distended. Has 1.3L urine output today. Will forego bladder scan for now. Pain was potentially secondary to urinary retention.  - Continue coude catheter - Senna daily PRN - Zofran PRN  # Protein calorie malnutrition Secondary to  debilitation from MS. Nursing staff aiding with feeding.  -Appreciate RD recommendations -Ensure shakes -Multivitamin   #Left hip wound Suspect secondary to fall prior to admission.  -Appreciate wound care recommendations -Cleanse, pat dry, mupirocin w/ foam dressing per wound care -Roll patient q2 hours   #Bipolar 1 disorder #Generalized anxiety disorder - Continue home duloxetine 30 mg daily and quetiapine 200 mg daily  -------------------------------------------- 2/13: Hand muscles are not moving today. He can't turn it at all. He feels weak overall. Neurologist said he will put him on medications to help him move. He saw a neurologist today. He remembers having a fever last night. He feels that he is struggling with his speech more. He was unable to get his MS medication. He is swallowing fine. No cough or choking. He endorses pain, muscle spasms.  2/14: Patient feeling alright today. He is sleepy this morning. Oriented to location and situation. He denies cough or sore throat. No nausea or diarrhea. No trouble breathing. He endorses his back hurts. He denies fevers. He can't move his left arm.    2/15: Patient evaluated at bedside, has difficulty verbalizing answers. Answers Dior Palomarez when asked to say his name, is oriented to hospital but cannot specify which hospital. He is also aware of why is he is hospitalized. Patient shakes head when asked if he has been eating enough, is unable to say if he has been helped with his meals. Endorses difficulty speaking. Denies HA or neck pain. Reports feeling warm earlier this morning.  Only able to blink eyes on command. Unable to flex neck. Retracts in pain with light palpation of right leg. Lymphadenopathy appreciated on cervical nodules bilaterally (mobile), axillary nodules on left. Reports he has had the cervical nodule has been present since he has been at [facility].   2/16 Patient evaluated at bedside, appears tired. Nods and  shakes head to questions today. Shakes head when asked for any improvements compared to yesterday. Patient starting IVIG today. Minimally responsive to questions, less alert today. Murmurs "yes" when asked if pain has improved. Denies feeling sad or anxious. Answers "I don't know" when asked how feels about his current state of health. Requests mother, Nunzio Cory, be contacted today.   [place] feel a little bietter, looks better, does not remember last BM    2/19 Improved from yesterday. Improved motor strength of extremities. Able to answer questions, speech is more clear and coherent. Voiding trial today.  2/20 Continues to improve. Moves L hand more spontaneously today. Reports no pain today. Patient reports he has been able to eat independently

## 2022-07-30 NOTE — ED Notes (Signed)
Fed patient lunch, patient left in stable condition to yellow zone, with staff and his belongings.

## 2022-07-30 NOTE — Evaluation (Signed)
Occupational Therapy Evaluation Patient Details Name: Bradley Brandt MRN: [id number] DOB: [birth year] Today's Date: 07/30/2022   History of Present Illness Pt is a 42 y.o. male presents to [facility] hospital on 2/11 with worsening generalized weakness.  Admitted for rhabdomyolysis. Noted pt with hx of MS and multiple recent falls, recently dc'd for [facility] 1/30 for MS flare. PMH includes relapsing remitting MS, bipolar disorder, GAD, insomnia.   Clinical Impression   Patient admitted for above and presents with problem list below.  He is lethargic during session, but improves throughout session. He reports living alone and falling at least 2x/day, trying to manage as best he could.  Reports limited assist from family.  He currently requires mod assist +2 for long sitting in bed and unable to tolerate movement towards EOB due to pain, he requires mod-total assist +2 for ADLs.  He is limited by L UE weakness, BUE decreased coordination, impaired vision, decreased activity tolerance, and pain.  Cognitively, he presents with poor attention, problem solving and recall.  Based on performance today, believe he will best benefit from continued OT services acutely and after dc at SNF level.  Will follow.      Recommendations for follow up therapy are one component of a multi-disciplinary discharge planning process, led by the attending physician.  Recommendations may be updated based on patient status, additional functional criteria and insurance authorization.   Follow Up Recommendations  Skilled nursing-short term rehab (<3 hours/day)     Assistance Recommended at Discharge Frequent or constant Supervision/Assistance  Patient can return home with the following Two people to help with walking and/or transfers;Two people to help with bathing/dressing/bathroom;Assistance with cooking/housework;Direct supervision/assist for medications management;Assistance with feeding;Direct supervision/assist for financial  management;Assist for transportation;Help with stairs or ramp for entrance    Functional Status Assessment  Patient has had a recent decline in their functional status and demonstrates the ability to make significant improvements in function in a reasonable and predictable amount of time.  Equipment Recommendations  Other (comment) (defer)    Recommendations for Other Services Speech consult     Precautions / Restrictions Precautions Precautions: Fall Restrictions Weight Bearing Restrictions: No      Mobility Bed Mobility Overal bed mobility: Needs Assistance Bed Mobility: Rolling Rolling: Max assist         General bed mobility comments: attempted EOB but with initation of LB movement, pt became vocal and in pain; unable to tolerate.  Rolling in bed with max assist, long sitting with mod assist +2.    Transfers                   General transfer comment: deferred      Balance Overall balance assessment: Needs assistance Sitting-balance support: Single extremity supported Sitting balance-Leahy Scale: Zero Sitting balance - Comments: long sitting with external support required                                   ADL either performed or assessed with clinical judgement   ADL Overall ADL's : Needs assistance/impaired     Grooming: Wash/dry face;Moderate assistance;Bed level           Upper Body Dressing : Maximal assistance;Bed level   Lower Body Dressing: Total assistance;+2 for physical assistance;+2 for safety/equipment;Bed level     Toilet Transfer Details (indicate cue type and reason): deferred         Functional mobility during  ADLs: Total assistance;+2 for physical assistance;+2 for safety/equipment General ADL Comments: limited to rolling in bed, long sitting briefly with mod assist +2     Vision   Additional Comments: pt reports vision changes and needing glasses.  further assessment required.     Perception      Praxis      Pertinent Vitals/Pain Pain Assessment Pain Assessment: Faces Faces Pain Scale: Hurts worst Pain Location: B LEs with movement Pain Descriptors / Indicators: Pins and needles, Sharp Pain Intervention(s): Limited activity within patient's tolerance, Monitored during session, Repositioned     Hand Dominance Right   Extremity/Trunk Assessment Upper Extremity Assessment Upper Extremity Assessment: LUE deficits/detail;RUE deficits/detail RUE Deficits / Details: grossly 3+/5 MMT, but decreased coordination RUE Sensation: WNL RUE Coordination: decreased fine motor;decreased gross motor LUE Deficits / Details: grossly weak, no active shoulder movement noted or pt unable to hold UE up when positioned.  Increased tone noted in shoulder and elbow. Pt with 3-/5 grasp, 3-/5 elbow extension and 2/5 elbow flexion. LUE Sensation: decreased light touch LUE Coordination: decreased fine motor;decreased gross motor   Lower Extremity Assessment Lower Extremity Assessment: Defer to PT evaluation       Communication Communication Communication: Expressive difficulties (dysarthria)   Cognition Arousal/Alertness: Lethargic Behavior During Therapy: Flat affect Overall Cognitive Status: Impaired/Different from baseline Area of Impairment: Problem solving, Following commands, Memory, Attention, Safety/judgement, Awareness                   Current Attention Level: Focused Memory: Decreased recall of precautions, Decreased short-term memory Following Commands: Follows one step commands consistently, Follows one step commands inconsistently Safety/Judgement: Decreased awareness of safety, Decreased awareness of deficits Awareness: Emergent Problem Solving: Slow processing, Decreased initiation, Difficulty sequencing, Requires verbal cues, Requires tactile cues General Comments: pt lethargic from ativan.  He presents with decreased initation and slow processing.  Pt aware he needs  more assist at home but just doesnt have help. Pt became easily frustrated with attempts to EOB due to pain.     General Comments  VSS    Exercises     Shoulder Instructions      Home Living Family/patient expects to be discharged to:: Private residence Living Arrangements: Alone Available Help at Discharge: Family;Available PRN/intermittently Type of Home: House Home Access: Ramped entrance     Home Layout: One level     Bathroom Shower/Tub: Occupational psychologist: Handicapped height     Home Equipment: Rollator (4 wheels);Cane - single point;Grab bars - tub/shower;Shower seat;Wheelchair - manual          Prior Functioning/Environment Prior Level of Function : Needs assist             Mobility Comments: Pt reports falling at least 2x/day since dc'd home, trying to "make it work".  Very unclear how he is mobilitizing.  in Jan admission pt reports utilizing cane versus rollator. ADLs Comments: independent ADLs, but difficulty managing.  prior admission pt reported Sister assists with cooking, cleaning, and transportation but when asked to confirm pt reports he has not been getting much help.        OT Problem List: Decreased strength;Decreased cognition;Decreased safety awareness;Decreased activity tolerance;Impaired balance (sitting and/or standing);Pain;Impaired vision/perception;Decreased coordination;Decreased range of motion;Decreased knowledge of use of DME or AE;Decreased knowledge of precautions;Impaired sensation;Impaired tone;Impaired UE functional use      OT Treatment/Interventions: Self-care/ADL training;Therapeutic activities;Therapeutic exercise;Neuromuscular education;Energy conservation;Patient/family education;DME and/or AE instruction;Balance training;Manual therapy;Modalities;Visual/perceptual remediation/compensation;Cognitive remediation/compensation;Splinting    OT Goals(Current goals  can be found in the care plan section) Acute Rehab  OT Goals Patient Stated Goal: rehab OT Goal Formulation: With patient Time For Goal Achievement: 08/13/22 Potential to Achieve Goals: Good  OT Frequency: Min 3X/week    Co-evaluation PT/OT/SLP Co-Evaluation/Treatment: Yes Reason for Co-Treatment: For patient/therapist safety;To address functional/ADL transfers   OT goals addressed during session: ADL's and self-care      AM-PAC OT "6 Clicks" Daily Activity     Outcome Measure Help from another person eating meals?: A Lot Help from another person taking care of personal grooming?: A Lot Help from another person toileting, which includes using toliet, bedpan, or urinal?: Total Help from another person bathing (including washing, rinsing, drying)?: A Lot Help from another person to put on and taking off regular upper body clothing?: A Lot Help from another person to put on and taking off regular lower body clothing?: Total 6 Click Score: 10   End of Session Nurse Communication: Mobility status;Patient requests pain meds  Activity Tolerance: Patient limited by pain;Patient limited by lethargy Patient left: in bed;with call bell/phone within reach;with nursing/sitter in room  OT Visit Diagnosis: Other abnormalities of gait and mobility (R26.89);Muscle weakness (generalized) (M62.81);History of falling (Z91.81);Other symptoms and signs involving the nervous system (R29.898);Pain Pain - part of body:  (bil LEs)                Time: [id number] OT Time Calculation (min): 16 min Charges:  OT General Charges $OT Visit: 1 Visit OT Evaluation $OT Eval Moderate Complexity: 1 Mod  Jolaine Artist, OT Acute Rehabilitation Services Office [phone]   Delight Stare 07/30/2022, 2:06 PM

## 2022-07-31 ENCOUNTER — Inpatient Hospital Stay (HOSPITAL_COMMUNITY): Payer: 59

## 2022-07-31 DIAGNOSIS — G35 Multiple sclerosis: Secondary | ICD-10-CM

## 2022-07-31 LAB — CBC WITH DIFFERENTIAL/PLATELET
Abs Immature Granulocytes: 0.04 10*3/uL (ref 0.00–0.07)
Basophils Absolute: 0 10*3/uL (ref 0.0–0.1)
Basophils Relative: 0 %
Eosinophils Absolute: 0.1 10*3/uL (ref 0.0–0.5)
Eosinophils Relative: 2 %
HCT: 43 % (ref 39.0–52.0)
Hemoglobin: 14.5 g/dL (ref 13.0–17.0)
Immature Granulocytes: 0 %
Lymphocytes Relative: 45 %
Lymphs Abs: 4 10*3/uL (ref 0.7–4.0)
MCH: 32 pg (ref 26.0–34.0)
MCHC: 33.7 g/dL (ref 30.0–36.0)
MCV: 94.9 fL (ref 80.0–100.0)
Monocytes Absolute: 0.5 10*3/uL (ref 0.1–1.0)
Monocytes Relative: 6 %
Neutro Abs: 4.2 10*3/uL (ref 1.7–7.7)
Neutrophils Relative %: 47 %
Platelets: 146 10*3/uL — ABNORMAL LOW (ref 150–400)
RBC: 4.53 MIL/uL (ref 4.22–5.81)
RDW: 13.8 % (ref 11.5–15.5)
WBC: 8.9 10*3/uL (ref 4.0–10.5)
nRBC: 0.4 % — ABNORMAL HIGH (ref 0.0–0.2)

## 2022-07-31 LAB — COMPREHENSIVE METABOLIC PANEL
ALT: 62 U/L — ABNORMAL HIGH (ref 0–44)
AST: 96 U/L — ABNORMAL HIGH (ref 15–41)
Albumin: 2.4 g/dL — ABNORMAL LOW (ref 3.5–5.0)
Alkaline Phosphatase: 48 U/L (ref 38–126)
Anion gap: 6 (ref 5–15)
BUN: 17 mg/dL (ref 6–20)
CO2: 26 mmol/L (ref 22–32)
Calcium: 8.2 mg/dL — ABNORMAL LOW (ref 8.9–10.3)
Chloride: 105 mmol/L (ref 98–111)
Creatinine, Ser: 0.88 mg/dL (ref 0.61–1.24)
GFR, Estimated: >60 mL/min (ref >60)
Glucose, Bld: 87 mg/dL (ref 70–99)
Potassium: 4 mmol/L (ref 3.5–5.1)
Sodium: 137 mmol/L (ref 135–145)
Total Bilirubin: 0.4 mg/dL (ref 0.3–1.2)
Total Protein: 5 g/dL — ABNORMAL LOW (ref 6.5–8.1)

## 2022-07-31 LAB — MAGNESIUM: Magnesium: 2 mg/dL (ref 1.7–2.4)

## 2022-07-31 LAB — TECHNOLOGIST SMEAR REVIEW: Plt Morphology: NORMAL

## 2022-07-31 LAB — CK
Total CK: 3774 U/L — ABNORMAL HIGH (ref 49–397)
Total CK: 3430 U/L — ABNORMAL HIGH (ref 49–397)
Total CK: 2766 U/L — ABNORMAL HIGH (ref 49–397)

## 2022-07-31 LAB — HAPTOGLOBIN: Haptoglobin: 236 mg/dL (ref 23–355)

## 2022-07-31 MED ORDER — ORAL CARE MOUTH RINSE: 15.0000 mL | OROMUCOSAL | Status: DC | PRN

## 2022-07-31 MED ORDER — BACLOFEN 10 MG PO TABS
20.0000 mg | ORAL_TABLET | Freq: Four times a day (QID) | ORAL | Status: DC
  Administered 2022-07-31 – 2022-08-02 (×8): 20 mg via ORAL
  Filled 2022-07-31 (×7): qty 2

## 2022-07-31 MED ORDER — GADOBUTROL 1 MMOL/ML IV SOLN
8.0000 mL | Freq: Once | INTRAVENOUS | Status: AC | PRN
  Administered 2022-07-31: 8 mL via INTRAVENOUS

## 2022-07-31 MED ORDER — OXYCODONE HCL 5 MG PO TABS
5.0000 mg | ORAL_TABLET | ORAL | Status: DC | PRN
  Administered 2022-07-31 – 2022-08-07 (×17): 5 mg via ORAL
  Filled 2022-07-31 (×20): qty 1

## 2022-07-31 MED ORDER — ACETAMINOPHEN 325 MG PO TABS
650.0000 mg | ORAL_TABLET | Freq: Four times a day (QID) | ORAL | Status: DC | PRN
  Administered 2022-07-31 – 2022-08-03 (×3): 650 mg via ORAL
  Filled 2022-07-31 (×3): qty 2

## 2022-07-31 MED ORDER — VITAMIN D (ERGOCALCIFEROL) 1.25 MG (50000 UNIT) PO CAPS
50000.0000 [IU] | ORAL_CAPSULE | ORAL | Status: DC
  Administered 2022-07-31 – 2022-08-07 (×2): 50000 [IU] via ORAL
  Filled 2022-07-31 (×2): qty 1

## 2022-07-31 MED ORDER — HYDROMORPHONE HCL 2 MG PO TABS
1.0000 mg | ORAL_TABLET | Freq: Once | ORAL | Status: AC
  Administered 2022-07-31: 1 mg via ORAL
  Filled 2022-07-31: qty 1

## 2022-07-31 MED ORDER — ENOXAPARIN SODIUM 40 MG/0.4ML IJ SOSY
40.0000 mg | PREFILLED_SYRINGE | INTRAMUSCULAR | Status: DC
  Administered 2022-07-31 – 2022-08-06 (×6): 40 mg via SUBCUTANEOUS
  Filled 2022-07-31 (×6): qty 0.4

## 2022-07-31 MED ORDER — LIDOCAINE HCL URETHRAL/MUCOSAL 2 % EX GEL
1.0000 | Freq: Once | CUTANEOUS | Status: AC
  Administered 2022-07-31: 1 via URETHRAL
  Filled 2022-07-31: qty 6

## 2022-07-31 NOTE — Progress Notes (Signed)
   07/30/22 2026  Pain Assessment  Pain Scale 0-10  Pain Score 10  Pain Type Acute pain  Pain Location Back  Pain Descriptors / Indicators Aching  Pain Frequency Constant  Pain Onset On-going  Patients Stated Pain Goal 0  Pain Intervention(s) MD notified (Comment);Emotional support  Provider Notification  Provider Name/Title Internal Med/Dr. Posey Pronto  Date Provider Notified 07/30/22  Time Provider Notified 2026  Method of Notification Page  Notification Reason Requested by patient/family  Provider response See new orders  Date of Provider Response 07/30/22   Patient received Tylenol 1000 mg @ 1945 with no relief.  He is still c/o 10/10 pain to back.  Dr. Posey Pronto made aware.  Order received for 0.5 IV Dilaudid.  Given at 2058.  Will continue to monitor.  Earleen Reaper RN

## 2022-07-31 NOTE — Consult Note (Addendum)
Neurology Consultation Reason for Consult: Progressive weakness Requesting Physician: Dorian Pod  CC: Progressive weakness  History is obtained from: Chart review given patient's mental status  HPI: Bradley Brandt is a 42 y.o. male with a past medical history significant for bipolar disorder, relapsing remitting MS with poor adherence to Tysabri  Patient was recently admitted on 07/11/2022 with a rebound MS relapse in the setting of missed Tysabri doses (received doses 10/7, 11/3).  He was treated with 5 days of Solu-Medrol with improvement in his symptoms from 1/24 through 1/28.  At completion of steroids he was noted to have neurological examination notable for slight decrease sensation in right V1 and V2 distribution in the face and 4+/5 left upper and lower extremity weakness, 5/5 on the right.  He did additionally receive a dose of Tysabri on 2/7   Subsequently on follow-up with on 2/8 outpatient neurology he was noted to have some worsening bilateral lower extremity weakness and found to be unable to walk (right upper extremity documented 5/5, left upper extremity 4+/5, right lower extremity 4 -/5, left lower extremity 4/5). Option to switch to Ocrevus or Symptoms were discussed with the patient for improved adherence but he declined changing medications. He was treated with 1 additional day of IV steroids and given a prescription for 3 days of 1000 mg daily oral steroids.  There was a patient message on 2/9 from his father stating that 21 pills of the 50 mg pills had spilled in urine; unclear if he completed the subsequent oral steroids.    Initial workup was concerning for a significant drop in his hemoglobin from 16 to 8.6, found to be spurious on repeat hemoglobin check.  Additionally imaging was unrevealing for any acute process with negative chest x-ray and negative CT abdomen pelvis.  He did have rhabdomyolysis potentially consistent with history of having been down at home for couple  of days  Per nurse at bedside, patient was in 10/10 back pain and therefore was given Tylenol, one-time dose of Dilaudid 0.5 mg at 2058, as well as his nighttime medications of baclofen, gabapentin, Seroquel, with subsequent somnolence   ROS: Unable to obtain due to altered mental status.   Past Medical History:  Diagnosis Date   Bipolar 1 disorder ([place])    Bipolar disorder, unspecified ([place]) 01/03/2013   MS (multiple sclerosis) ([place]) dx 2006   relapsing-remitting right sided weakness   Neuromuscular disorder ([facility])    MS   Past Surgical History:  Procedure Laterality Date   TUMOR REMOVAL  1983   abdomen benign   Current Outpatient Medications  Medication Instructions   acetaminophen (TYLENOL) 500 mg, Oral, Every 8 hours PRN   baclofen (LIORESAL) 20 mg, Oral, 3 times daily   DULoxetine (CYMBALTA) 30 mg, Oral, Daily   gabapentin (NEURONTIN) 800 mg, Oral, 2 times daily   lidocaine (HM LIDOCAINE PATCH) 4 % 1 patch, Transdermal, Every 24 hours   natalizumab (TYSABRI) 300 mg, Intravenous, Every 28 days   predniSONE (DELTASONE) 50 MG tablet Additional pills to replace pills that are damaged.  Take as directed.   QUEtiapine (SEROQUEL) 200 mg, Oral, Daily at bedtime, Take a half tablet at night for 4 days then increase to a whole tablet each night.   Vitamin D (Ergocalciferol) (DRISDOL) 50,000 Units, Oral, Every 7 days     Family History  Adopted: Yes  Problem Relation Age of Onset   Lupus Mother    Ataxia Neg Hx    Chorea Neg Hx  Dementia Neg Hx    Mental retardation Neg Hx    Migraines Neg Hx    Multiple sclerosis Neg Hx    Neurofibromatosis Neg Hx    Neuropathy Neg Hx    Parkinsonism Neg Hx    Seizures Neg Hx    Stroke Neg Hx     Social History:  reports that he has been smoking cigarettes. He has been smoking an average of 1 pack per day. He has never used smokeless tobacco. He reports that he does not currently use alcohol after a past usage of about 72.0 standard  drinks of alcohol per week. He reports that he does not currently use drugs after having used the following drugs: Marijuana. Frequency: 7.00 times per week.   Exam: Current vital signs: BP 111/64 (BP Location: Left Arm)   Pulse 77   Temp 98.4 F (36.9 C) (Axillary)   Resp 16   Ht 5' 11"$  (1.803 m)   Wt 80.7 kg   SpO2 100%   BMI 24.81 kg/m  Vital signs in last 24 hours: Temp:  [98 F (36.7 C)-99.3 F (37.4 C)] 98.4 F (36.9 C) (02/12 0043) Pulse Rate:  [48-80] 77 (02/12 0043) Resp:  [11-20] 16 (02/12 0043) BP: (111-175)/(51-82) 111/64 (02/12 0043) SpO2:  [97 %-100 %] 100 % (02/12 0043) Weight:  [80.7 kg-81.6 kg] 80.7 kg (02/11 1757)   Physical Exam  Constitutional: Appears well-developed and well-nourished.  Warm to the touch, possibly diaphoretic Psych: Minimally interactive Eyes: No scleral injection HENT: No oropharyngeal obstruction.  MSK: no joint deformities.  Cardiovascular: Normal rate and regular rhythm. Perfusing extremities well Respiratory: Effort normal, non-labored breathing, intermittent cough GI: Soft.  No distension. There is no tenderness.   Neuro: Mental Status: Patient is somnolent, not following any commands, Cranial Nerves: II: Pupils are equal, round, and reactive to light.   III,IV, VI: Roving eye movements V: Facial sensation is symmetric to eyelash brush VII: Facial movement is symmetric on grimace.  VIII: Minimal response to voice Remain unable to obtain due to mental status Sensory/motor: Withdraws in the bilateral upper extremities, slightly more briskly on the right than the left.  Minimal movement in bilateral lower extremities Deep Tendon Reflexes: No clonus in the bilateral ankles Cerebellar/Gait:  Unable to assess secondary to patient's mental status, has been nonambulatory at home recently per report  I have reviewed labs in epic and the results pertinent to this consultation are:  Basic Metabolic Panel: Recent Labs  Lab  07/30/22 0145 07/30/22 0609 07/30/22 0618  NA 143  --  141  K 3.8  --  3.8  CL 113*  --   --   CO2 19*  --   --   GLUCOSE 88  --   --   BUN 23*  --   --   CREATININE 1.06  --   --   CALCIUM 7.9*  --   --   MG 2.1  --   --   PHOS  --  2.9  --     AST elevated at 147, ALT elevated at 67,  CBC: Recent Labs  Lab 07/27/22 1057 07/30/22 0145 07/30/22 0618 07/30/22 2104  WBC 7.5 6.9  --   --   NEUTROABS 4.0 4.8  --  5.9  HGB 16.7 8.6* 13.9 13.9  HCT 51.1* 27.3* 41.0 40.1  MCV 95 103.8*  --   --   PLT 231 108*  --   --     Coagulation Studies: Recent  Labs    07/30/22 0609  LABPROT 13.9  INR 1.1    CK has improved from 8386 to 4864  Lactate 1.5, 1.1  TSH 1.19  I have reviewed the images obtained:  CT chest abdomen pelvis negative for acute process, incidentally noted left kidney stone Chest x-ray negative for acute process  Impression: Given no clear medical etiology of progressive weakness, high concern for incompletely treated Tysabri rebound multiple sclerosis relapse which can be very challenging to control.  Holding off on stat head CT at this time as cranial nerves are intact and suspect his sedation is secondary to medication administration.  If imaging does reveal further demyelinating disease will need to carefully consider additional immunosuppressive treatment from here. He did recently receive natalizumab outpatient, and PLEX would remove this treatment but he has already had significant steroid exposure.  Recommendations: -MRI brain and cervical spine, thoracic spine with and without contrast, ordered stat.  Reached out to MRI technicians to kindly ask them to prioritize the study -Rectal temperature, broad infectious workup if temperature is elevated, will additionally hold off on antipyretic agents (Tylenol order canceled) to monitor fever curve.  5 mg oxycodone every 4 hours as needed pain for now -Neurology will follow along  Lesleigh Noe  MD-PhD [facility] [phone] Available 7 PM to 7 AM, outside of these hours please call Neurologist on call as listed on Amion.

## 2022-07-31 NOTE — Progress Notes (Cosign Needed)
Subjective:   Patient reports that he has decreased sensation and weakness of his right hand over the last few days. Reports some pain in his right hip and lower back after his fall at home. Also complaining of muscle cramping/spasms of his RLE. Denies abdominal pain, nausea, or vomiting. Denies other complaints at this time.   Objective:  Vital signs in last 24 hours: Vitals:   07/31/22 0043 07/31/22 0058 07/31/22 0432 07/31/22 0528  BP: 111/64  132/81 121/78  Pulse: 77  (!) 53 (!) 55  Resp: 16  16 18  $ Temp: 98.4 F (36.9 C) 97.6 F (36.4 C) 97.8 F (36.6 C) 97.8 F (36.6 C)  TempSrc: Axillary Rectal Oral Oral  SpO2: 100%  100% 98%  Weight:      Height:       Physical Exam: General: not in acute distress, appears uncomfortable CV: Regular rate, rhythm. No murmurs, rubs, or gallops. No LE swelling.  Pulm: Normal work of breathing on room air. No wheezing or crackles. Abdomen: Soft, non-tender, non-distended. Normoactive bowel sounds. MSK: increased muscle tone of upper and lower extremities, muscle spasms of the RLE.  Neuro: Awake, alert, conversing appropriately. Cranial nerves grossly intact. 1/5 grip strength of the LUE. 3/5 grip strength of the RUE. 1/5 strength of bilateral LE.  Skin: Warm, dry. No rash.   Assessment/Plan:  Principal Problem:   Rhabdomyolysis  Bradley Brandt is 42yo male with relapsing/remitting multiple sclerosis, bipolar 1 disorder, generalized anxiety disorder, and insomnia admitted for rhabdomyolysis and generalized deconditioning likely secondary to progression of MS.    #Rhabdomyolysis Multiple ground level falls at home prior to admission. Found down by his neighbor. Continuing with IV fluids. CK continues to trend down, 3,774 this morning.  - Continue LR 150/hr - Trend CK q6 hours - Trend BMP   #History of relapsing/remitting multiple sclerosis #Declining functional status Presented w/ worsening generalized weakness. Patient reports  compliance w/ prednisone at home. Neuro saw the patient overnight and was concerned for incompletely treated Tysabri rebound MS relapse. Ordered stat MRI brain (demonstrates multifocal progressed/active demyelinating disease compared to last month most notably in left brainstem/right semiovale and new paranasal sinus inflammation), MRI C-spine (mild progression since 2022 w/o active demyelination, stable cervical spine degeneration, C5-C6 spinal stenosis), and MRI T-spine (stable demyelination, right T7 hemicord lesion is more swollen, new 2 cm area of enhancement at left paraspinal muscles overlying T3/T4 spinous process suspected to be post-traumatic vs infectious vs inflammatory myositis). Neuro also discontinued tylenol to trend fever curve. Will reach out to neurology today for further recommendations.  - F/u neuro recommendations - PT/OT - Continue home gabapentin 800 mg BID - Increase baclofen to 20 mg q6H for muscle spasms - Tylenol 650 mg q6 PRN for lower back/right hip pain   #Acute normocytic anemia Hgb 8.6 on admission. Improved to 14.5 today. CT abd/pelvis unremarkable. No signs of head trauma or hematuria. LDH elevated at 395. Folate WNL. Initially suspected anemia may be contributing to generalized weakness, however, he continues to be weak despite normalization of Hgb. More likely secondary to his MS given recent MRI findings.   - Pending blood smear and haptoglobin for possible hemolysis - Trend CBC   #Abdominal pain CT abd/pelvis unremarkable. Did have 400+ mL on bladder scan overnight. Patient failed I&O several times w/ plan for coude catheter which has been placed. No abdominal pain or n/v/d today. On exam, abdomen is non-distended. Has 1.3L urine output today. Will forego bladder scan for now.  Pain was potentially secondary to urinary retention.  - Continue coude catheter - Senna daily PRN - Zofran PRN   #Bipolar 1 disorder #Generalized anxiety disorder - Continue home  duloxetine 30 mg daily and quetiapine 200 mg daily  Diet: regular Bowel: senna VTE: Lovenox IVF: LR 150 mL/hr Code: Full PT/OT recs: SNF   Prior to Admission Living Arrangement: at home alone Anticipated Discharge Location: TBD Barriers to Discharge: continued management Dispo: Anticipated discharge in approximately more than 2 day(s).   Starlyn Skeans, MD 07/31/2022, 6:07 AM Pager: [phone] After 5pm on weekdays and 1pm on weekends: On Call pager [phone]

## 2022-07-31 NOTE — Progress Notes (Signed)
       RE:   Bradley Brandt     Date of Birth:  1980-06-29    Date:   07/31/2022      To Whom It May Concern:  Please be advised that the above-named patient will require a short-term nursing home stay - anticipated 30 days or less for rehabilitation and strengthening.  The plan is for return home.

## 2022-07-31 NOTE — Progress Notes (Signed)
16 fr. Coude cath placed per orders. Patient tolerated with moderate discomfort. Lidocaine gel used. (+) urine return. Bradley Brandt A

## 2022-07-31 NOTE — TOC Initial Note (Addendum)
Transition of Care [facility]) - Initial/Assessment Note    Patient Details  Name: Bradley Brandt MRN: [id number] Date of Birth: [birth year]  Transition of Care [facility]) CM/SW Contact:    Bethann Berkshire, [place] Phone Number: 07/31/2022, 10:14 AM  Clinical Narrative:                  CSW met with pt to discuss PT rec for SNF. Pt lives alone in [place]. He is agreeable to SNF workup with ultimate goal of returning home after STR. CSW explained SNF w/up and insurance auth process. Fl2 completed and bed requests sent in hub.   PASRR pending  [id number]: PASRR documents uploaded to [facility] PASRR status: Pending  Expected Discharge Plan: Skilled Nursing Facility Barriers to Discharge: SNF Pending bed offer, Continued Medical Work up   Patient Goals and CMS Choice            Expected Discharge Plan and Services       Living arrangements for the past 2 months: Single Family Home                                      Prior Living Arrangements/Services Living arrangements for the past 2 months: Single Family Home Lives with:: Self Patient language and need for interpreter reviewed:: Yes        Need for Family Participation in Patient Care: Yes (Comment) Care giver support system in place?: Yes (comment)   Criminal Activity/Legal Involvement Pertinent to Current Situation/Hospitalization: No - Comment as needed  Activities of Daily Living      Permission Sought/Granted                  Emotional Assessment Appearance:: Appears stated age Attitude/Demeanor/Rapport: Lethargic Affect (typically observed): Accepting Orientation: : Oriented to Self, Oriented to Place, Oriented to  Time, Oriented to Situation Alcohol / Substance Use: Not Applicable Psych Involvement: No (comment)  Admission diagnosis:  Rhabdomyolysis [M62.82] Weakness [R53.1] Thrombocytopenia (HCC) [D69.6] Elevated transaminase level [R74.01] Normochromic normocytic anemia [D64.9] Non-traumatic  rhabdomyolysis [M62.82] Patient Active Problem List   Diagnosis Date Noted   Rhabdomyolysis 07/30/2022   Multiple sclerosis exacerbation ([place]) 07/12/2022   Marijuana use 11/22/2020   History of marijuana use 11/22/2020   Chronic pain syndrome 11/21/2020   Pharmacologic therapy 11/21/2020   Disorder of skeletal system 11/21/2020   Problems influencing health status 11/21/2020   MS (multiple sclerosis) ([place])    Neuromuscular disorder (HCC)    Bipolar disorder in full remission ([place]) 11/16/2020   Other insomnia 08/11/2020   Generalized anxiety disorder 08/11/2020   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[place]) 12/31/2017   Optic neuritis 08/02/2017   High risk medication use 08/02/2017   Vitamin D deficiency 09/19/2016   Hypercholesterolemia 09/19/2016   Numbness 12/16/2015   Spastic gait 12/16/2015   Urinary hesitancy 12/16/2015   Low back pain with sciatica 09/28/2014   Polysubstance dependence ([place]) 02/09/2014   Combined drug dependence excluding opioids ([place]) 02/09/2014   Relapsing remitting multiple sclerosis ([place]) 05/28/2013   Elevated BP 01/06/2013   Bipolar disorder, unspecified ([place]) 01/03/2013   Bipolar disorder ([place]) 01/03/2013   Right hemiparesis ([place]) 01/02/2013   Multiple sclerosis flare 01/02/2013   Hemiplegia ([place]) 01/02/2013   PCP:  Fenton Foy, NP Pharmacy:   [facility] [id number] - [place], [place]  [address] [place] [address] Phone: [phone] Fax: [phone]  [place], [place] [address] [place] [address] Phone: [phone] Fax: [phone]  [id number] [place] [address] [address]Phone: [phone]: [phone]    Social Determinants of Health (SDOH) Social History: STice No Food Insecurity (07/12/2022)   Housing: Low Risk  (07/12/2022)  Transportation Needs: No Transportation Needs (07/12/2022)  Utilities: Not At Risk (07/12/2022)  Depression (PHQ2-9): Low Risk  (10/12/2021)  Recent Concern: Depression (PHQ2-9) - Medium Risk (08/04/2021)  Tobacco Use: High Risk (07/30/2022)   SDOH Interventions:     Readmission Risk Interventions     No data to display

## 2022-07-31 NOTE — NC FL2 (Signed)
[place] LEVEL OF CARE FORM     IDENTIFICATION  Patient Name: Bradley Brandt Birthdate: 03/16/1981 Sex: male Admission Date (Current Location): 07/30/2022  [facility] and [place] Number:  Herbalist and Address:  [facility], [place] [address], [place], [place] [address]      Provider Number: [id number]  Attending Physician Name and Address:  Angelica Pou, MD  Relative Name and Phone Number:       Current Level of Care: Hospital Recommended Level of Care: [place] Prior Approval Number:    Date Approved/Denied:   PASRR Number: Pending  Discharge Plan: Home    Current Diagnoses: Patient Active Problem List   Diagnosis Date Noted   Rhabdomyolysis 07/30/2022   Multiple sclerosis exacerbation ([place]) 07/12/2022   Marijuana use 11/22/2020   History of marijuana use 11/22/2020   Chronic pain syndrome 11/21/2020   Pharmacologic therapy 11/21/2020   Disorder of skeletal system 11/21/2020   Problems influencing health status 11/21/2020   MS (multiple sclerosis) ([place])    Neuromuscular disorder ([place])    Bipolar disorder in full remission ([place]) 11/16/2020   Other insomnia 08/11/2020   Generalized anxiety disorder 08/11/2020   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[place]) 12/31/2017   Optic neuritis 08/02/2017   High risk medication use 08/02/2017   Vitamin D deficiency 09/19/2016   Hypercholesterolemia 09/19/2016   Numbness 12/16/2015   Spastic gait 12/16/2015   Urinary hesitancy 12/16/2015   Low back pain with sciatica 09/28/2014   Polysubstance dependence ([place]) 02/09/2014   Combined drug dependence excluding opioids ([place]) 02/09/2014   Relapsing remitting multiple sclerosis ([place]) 05/28/2013   Elevated BP 01/06/2013   Bipolar disorder, unspecified ([place]) 01/03/2013   Bipolar disorder ([place]) 01/03/2013   Right hemiparesis ([place]) 01/02/2013   Multiple sclerosis flare 01/02/2013   Hemiplegia ([place]) 01/02/2013     Orientation RESPIRATION BLADDER Height & Weight     Self, Time, Situation, Place  Normal Incontinent, Indwelling catheter Weight: 177 lb 14.6 oz (80.7 kg) Height:  5' 11"$  (180.3 cm)  BEHAVIORAL SYMPTOMS/MOOD NEUROLOGICAL BOWEL NUTRITION STATUS      Incontinent Diet (see d/c summary)  AMBULATORY STATUS COMMUNICATION OF NEEDS Skin   Extensive Assist Verbally Normal                       Personal Care Assistance Level of Assistance  Bathing, Feeding, Dressing Bathing Assistance: Limited assistance Feeding assistance: Independent Dressing Assistance: Limited assistance     Functional Limitations Info  Sight, Hearing, Speech Sight Info: Impaired Hearing Info: Adequate Speech Info: Adequate    SPECIAL CARE FACTORS FREQUENCY  PT (By licensed PT), OT (By licensed OT)     PT Frequency: 5x/week OT Frequency: 5x/week            Contractures Contractures Info: Not present    Additional Factors Info  Code Status, Allergies Code Status Info: Full code Allergies Info: Lithium           Current Medications (07/31/2022):  This is the current hospital active medication list Current Facility-Administered Medications  Medication Dose Route Frequency Provider Last Rate Last Admin   baclofen (LIORESAL) tablet 20 mg  20 mg Oral TID Lacinda Axon, MD   20 mg at 07/30/22 2100   DULoxetine (CYMBALTA) DR capsule 30 mg  30 mg Oral Daily Lacinda Axon, MD   30 mg at 07/30/22 1234   gabapentin (NEURONTIN) capsule 800 mg  800 mg  Oral BID Lacinda Axon, MD   800 mg at 07/30/22 2100   lactated ringers infusion   Intravenous Continuous Sanjuan Dame, MD   Paused at 07/31/22 0226   naphazoline-glycerin (CLEAR EYES REDNESS) ophth solution 1-2 drop  1-2 drop Both Eyes QID PRN Sanjuan Dame, MD       ondansetron [facility]) tablet 4 mg  4 mg Oral Q6H PRN Lacinda Axon, MD       Or   ondansetron [facility]) injection 4 mg  4 mg Intravenous Q6H PRN Lacinda Axon, MD       Oral care mouth rinse  15 mL Mouth Rinse PRN Angelica Pou, MD       oxyCODONE (Oxy IR/ROXICODONE) immediate release tablet 5 mg  5 mg Oral Q4H PRN Bhagat, Srishti L, MD   5 mg at 07/31/22 0458   QUEtiapine (SEROQUEL) tablet 200 mg  200 mg Oral QHS Lacinda Axon, MD   200 mg at 07/30/22 2059   senna-docusate (Senokot-S) tablet 1 tablet  1 tablet Oral QHS PRN Lacinda Axon, MD         Discharge Medications: Please see discharge summary for a list of discharge medications.  Relevant Imaging Results:  Relevant Lab Results:   Additional Information SSN [place] Johnson, [place]

## 2022-07-31 NOTE — Progress Notes (Signed)
During the night, the patient required a STAT MRI of Brain and Spine per order from Neurologist.  Verified with Dr. Posey Pronto that it was ok to send patient off telemetry and with LR @ 150 capped off while down in MRI.  Prior to going to MRI, it was noted that patient had no urine output.  Bladder scan showed greater than 345 cc.  Per Dr. Posey Pronto, ok to I & O cath patient after he comes back from MRI.  Once patient back from MRI, attempt made to I & O cath patient made by two RNs.  Each time, we were met with significant resistance and very painful to the patient.  Repeat bladder scan showed greater than 325 cc in the bladder.  Dr. Posey Pronto made aware.  Order received for 16 French Coude Catheter.  Coude Catheter successfully inserted by Rehab RN.    After this patient, is complaining of severe lower back pain.  He rated as 10/10.  He was given 5 mg PO oxycodone at 0458.  Pain came down to 7/10.  He then complained of pain again and rated as 10/10.  Dr. Posey Pronto made aware.  Order received for PO Dilaudid 1 mg.  Unable to give IV at this time due to PIV infiltrated and awaiting Ultrasound guided IV insertion by IV Team.  This was given at 0641.  Successful IV obtained and IVF restarted by day RN.  Will continue to monitor patient.  Earleen Reaper RN

## 2022-07-31 NOTE — Progress Notes (Signed)
Meeting resistance and patient is experiencing severe pain while inserting I&O catheter. Will hold for now. No bleeding upon retraction.

## 2022-08-01 ENCOUNTER — Inpatient Hospital Stay (HOSPITAL_COMMUNITY): Payer: 59

## 2022-08-01 DIAGNOSIS — R531 Weakness: Secondary | ICD-10-CM | POA: Diagnosis not present

## 2022-08-01 DIAGNOSIS — R338 Other retention of urine: Secondary | ICD-10-CM | POA: Diagnosis present

## 2022-08-01 DIAGNOSIS — R509 Fever, unspecified: Secondary | ICD-10-CM | POA: Diagnosis not present

## 2022-08-01 LAB — COMPREHENSIVE METABOLIC PANEL
ALT: 56 U/L — ABNORMAL HIGH (ref 0–44)
AST: 62 U/L — ABNORMAL HIGH (ref 15–41)
Albumin: 2.3 g/dL — ABNORMAL LOW (ref 3.5–5.0)
Alkaline Phosphatase: 51 U/L (ref 38–126)
Anion gap: 5 (ref 5–15)
BUN: 12 mg/dL (ref 6–20)
CO2: 27 mmol/L (ref 22–32)
Calcium: 8.3 mg/dL — ABNORMAL LOW (ref 8.9–10.3)
Chloride: 103 mmol/L (ref 98–111)
Creatinine, Ser: 0.93 mg/dL (ref 0.61–1.24)
GFR, Estimated: >60 mL/min (ref >60)
Glucose, Bld: 121 mg/dL — ABNORMAL HIGH (ref 70–99)
Potassium: 3.9 mmol/L (ref 3.5–5.1)
Sodium: 135 mmol/L (ref 135–145)
Total Bilirubin: 0.3 mg/dL (ref 0.3–1.2)
Total Protein: 4.9 g/dL — ABNORMAL LOW (ref 6.5–8.1)

## 2022-08-01 LAB — BASIC METABOLIC PANEL
Anion gap: 7 (ref 5–15)
BUN: 10 mg/dL (ref 6–20)
CO2: 27 mmol/L (ref 22–32)
Calcium: 8.3 mg/dL — ABNORMAL LOW (ref 8.9–10.3)
Chloride: 103 mmol/L (ref 98–111)
Creatinine, Ser: 0.88 mg/dL (ref 0.61–1.24)
GFR, Estimated: >60 mL/min (ref >60)
Glucose, Bld: 101 mg/dL — ABNORMAL HIGH (ref 70–99)
Potassium: 3.9 mmol/L (ref 3.5–5.1)
Sodium: 137 mmol/L (ref 135–145)

## 2022-08-01 LAB — URINALYSIS, COMPLETE (UACMP) WITH MICROSCOPIC
Bilirubin Urine: NEGATIVE
Glucose, UA: NEGATIVE mg/dL
Ketones, ur: NEGATIVE mg/dL
Leukocytes,Ua: NEGATIVE
Nitrite: NEGATIVE
Protein, ur: NEGATIVE mg/dL
Specific Gravity, Urine: 1.008 (ref 1.005–1.030)
pH: 8 (ref 5.0–8.0)

## 2022-08-01 LAB — CBC
HCT: 40.1 % (ref 39.0–52.0)
HCT: 40.5 % (ref 39.0–52.0)
Hemoglobin: 13.9 g/dL (ref 13.0–17.0)
Hemoglobin: 14 g/dL (ref 13.0–17.0)
MCH: 31.9 pg (ref 26.0–34.0)
MCH: 32 pg (ref 26.0–34.0)
MCHC: 34.3 g/dL (ref 30.0–36.0)
MCHC: 34.9 g/dL (ref 30.0–36.0)
MCV: 91.3 fL (ref 80.0–100.0)
MCV: 93.1 fL (ref 80.0–100.0)
Platelets: 148 10*3/uL — ABNORMAL LOW (ref 150–400)
Platelets: 149 10*3/uL — ABNORMAL LOW (ref 150–400)
RBC: 4.35 MIL/uL (ref 4.22–5.81)
RBC: 4.39 MIL/uL (ref 4.22–5.81)
RDW: 13.3 % (ref 11.5–15.5)
RDW: 13.4 % (ref 11.5–15.5)
WBC: 9.4 10*3/uL (ref 4.0–10.5)
WBC: 9.6 10*3/uL (ref 4.0–10.5)
nRBC: 0.4 % — ABNORMAL HIGH (ref 0.0–0.2)
nRBC: 0.5 % — ABNORMAL HIGH (ref 0.0–0.2)

## 2022-08-01 LAB — CK
Total CK: 2192 U/L — ABNORMAL HIGH (ref 49–397)
Total CK: 1926 U/L — ABNORMAL HIGH (ref 49–397)
Total CK: 1602 U/L — ABNORMAL HIGH (ref 49–397)
Total CK: 1482 U/L — ABNORMAL HIGH (ref 49–397)

## 2022-08-01 MED ORDER — CHLORHEXIDINE GLUCONATE CLOTH 2 % EX PADS
6.0000 | MEDICATED_PAD | Freq: Every day | CUTANEOUS | Status: DC
  Administered 2022-08-01 – 2022-08-09 (×5): 6 via TOPICAL

## 2022-08-01 MED ORDER — LORAZEPAM 2 MG/ML IJ SOLN: 0.5000 mg | Freq: Once | INTRAMUSCULAR | Status: DC

## 2022-08-01 NOTE — Progress Notes (Signed)
   07/31/22 2347  Assess: MEWS Score  Temp (!) 102.2 F (39 C)  BP (!) 105/52  MAP (mmHg) 67  Pulse Rate (!) 101  Resp 18  Level of Consciousness Alert  SpO2 96 %  O2 Device Room Air  Assess: MEWS Score  MEWS Temp 2  MEWS Systolic 0  MEWS Pulse 1  MEWS RR 0  MEWS LOC 0  MEWS Score 3  MEWS Score Color Yellow  Assess: if the MEWS score is Yellow or Red  Were vital signs taken at a resting state? Yes  Focused Assessment No change from prior assessment  Does the patient meet 2 or more of the SIRS criteria? Yes  Does the patient have a confirmed or suspected source of infection? No  MEWS guidelines implemented  Yes, yellow  Treat  MEWS Interventions Considered administering scheduled or prn medications/treatments as ordered  Pain Scale 0-10  Pain Score 0  MEWS Interventions Escalated (See documentation below);Administered prn meds/treatments  Take Vital Signs  Increase Vital Sign Frequency  Yellow: Q2hr x1, continue Q4hrs until patient remains green for 12hrs  Escalate  MEWS: Escalate Yellow: Discuss with charge nurse and consider notifying provider and/or RRT  Notify: Charge Nurse/RN  Name of Charge Nurse/RN Notified Emman, RN  Provider Notification  Provider Name/Title Dr. Markus Jarvis  Date Provider Notified 08/01/22  Time Provider Notified 0000  Method of Notification Page  Notification Reason Change in status  Provider response See new orders  Date of Provider Response 08/01/22  Time of Provider Response 0018  Assess: SIRS CRITERIA  SIRS Temperature  1  SIRS Pulse 1  SIRS Respirations  0  SIRS WBC 0  SIRS Score Sum  2

## 2022-08-01 NOTE — Progress Notes (Addendum)
Subjective:   Overnight, patient developed a fever of 102F.  He was given Tylenol.  Fever has resolved this morning.  Patient continues to have difficulty moving his right hand.  He reports that he still feels weak overall and is not improving.  He reports that he is having increased difficulty speaking today. He continues to have occasional muscle spasms. He is frustrated overall with not feeling well. Discussed plan to speak with neurology today regarding treatment of his MS.  Objective:  Vital signs in last 24 hours: Vitals:   07/31/22 2119 07/31/22 2347 08/01/22 0118 08/01/22 0437  BP: (!) 154/82 (!) 105/52 117/60 127/67  Pulse: 90 (!) 101 91 67  Resp: 18 18 18 14  $ Temp: (!) 101.7 F (38.7 C) (!) 102.2 F (39 C) 99.1 F (37.3 C) 99 F (37.2 C)  TempSrc: Oral Oral Oral Oral  SpO2: 99% 96% 92% 94%  Weight:      Height:       Physical Exam: General: appears uncomfortable, slow speech  CV: Regular rate, rhythm. No murmurs, rubs, or gallops. No LE swelling.  Pulm: Normal work of breathing on room air. No wheezing or crackles. Abdomen: Soft, non-tender, non-distended. Normoactive bowel sounds. MSK: increased muscle tone of upper and lower extremities worst in the LLE, LLE muscle spasm noted.  Neuro: Awake, alert. Cranial nerves intact. 1/5 grip strength of the LUE. 3/5 grip strength of the RUE. 1/5 strength of bilateral LE.  Skin: Warm, dry. No rash.  Assessment/Plan:  Principal Problem:   Rhabdomyolysis Active Problems:   Multiple sclerosis flare   Relapsing remitting multiple sclerosis (HCC)   Acute urinary retention   Fever  Bradley Brandt is 42yo male with relapsing/remitting multiple sclerosis, bipolar 1 disorder, generalized anxiety disorder, and insomnia admitted for rhabdomyolysis and generalized deconditioning likely secondary to progression of MS.       #History of relapsing/remitting multiple sclerosis #Declining functional status Presented w/ worsening  generalized weakness. Compliant w/ steroids and Tysabri as outpatient. Neuro concerned for incompletely treated Tysabri rebound MS relapse. MRI brain/c-spine concerning for MS flare. Continues to have limited strength of his right hand, generalized weakness, and worsening dysarthria. Spoke with neuro today and discussed worsening condition. Neuro would like to hold of on steroids for now given how recently he received steroids. Neuro states that plasma exchange would not be beneficial and potentially worsen his condition as it would decrease the amount of circulating Tysabri from his most recent injection. Neuro discussed possibility of IVIG, however, data is limited. Neuro would prefer to continue w/ infectious workup at this time and consider IVIG if workup is negative and he continues to decline.  - F/u neuro recommendations - Continue home gabapentin 800 mg BID - Baclofen 20 mg q6H for muscle spasms - Tylenol 650 mg q6 PRN for pain - PT/OT  #Fever Patient spiked fever of 102.2 overnight. Blood cultures and UA/culture were ordered.  No abx were started given hemodynamic stability. No crackles on lung exam. CXR negative for acute disease. Fever resolved this morning. No leukocytosis on CBC today. Will hold off on antibiotics at this time and reassess if patient fevers again or develops hemodynamic instability.  - Pending blood cultures/UA/culture - Trend CBC  #Rhabdomyolysis Suspect secondary to falls and being found down at home. CK trending downward, 1926 today. Creatinine WNL today. Continue w/ IV fluids.  - Continue LR 150/hr  -Renal fxn has been stable (no AKI associated with rhabdo).  Can space out BMPs -Frequent  CK no longer necessary.  Given downward trend, no need to check tomorrow.   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 Had coude catheter placed after elevated urine volume on bladder scan. Pain resolved afterwards. Suspect abdominal  pain was secondary to urinary retention. 3.6L urine output yesterday. - Continue coude catheter   #Bipolar 1 disorder #Generalized anxiety disorder - Continue home duloxetine 30 mg daily and quetiapine 200 mg daily    Diet: regular Bowel: senna VTE: Lovenox IVF: LR 150 mL/hr Code: Full PT/OT recs: SNF     Prior to Admission Living Arrangement: at home alone Anticipated Discharge Location: TBD Barriers to Discharge: continued management Dispo: Anticipated discharge in approximately more than 2 day(s).   Starlyn Skeans, MD 08/01/2022, 6:00 AM Pager: [phone] After 5pm on weekdays and 1pm on weekends: On Call pager [phone]

## 2022-08-01 NOTE — Progress Notes (Signed)
NEUROLOGY CONSULTATION PROGRESS NOTE   Date of service: August 01, 2022 Patient Name: Bradley Brandt MRN:  [id number] DOB:  1980-11-27  Brief HPI  Bradley Brandt is a 42 y.o. male with PMH significant for relapsing/remitting MS (Recent missed doses of Tysabri), Bipolar 1 disorder, GAD, and insomnia who presents with  worsening generalized weakness. Patient was found down at home, s/p fall.   He was recently admitted with concern for rebound MS flare in the setting of missed Tysabri on 07/11/2022 with imaging demonstrating active demyelinating lesions and received a full 5 days of Solu-Medrol. Follow-up with on 2/8 outpatient neurology he was noted to have some worsening bilateral lower extremity weakness and found to be unable to walk.  He was treated with 1 additional day of IV steroids and given a prescription for 3 days of 1000 mg daily oral steroids. A patient message on 2/9 from his father stated that 21 pills of the 50 mg pills had spilled in urine. Patient told me that he took 2 days of the steroids.   Patient was also c/o left arm and back pain, states that he fell onto his left side at home. Received dilaudid and oxycodone for pain, increased somnolence 2/12. Patient has history of insomnia, and did state that he has had trouble sleeping lately. Patient takes  duloxetine 30 mg daily and quetiapine 200 mg daily, reports good compliance with his meds. Patient denies recent sickness.    Interval Hx   Overnight, tmax increased to 102.2, BP down to 105/52 around midnight. These have both improved this am. On assessment this am, patient was being fed his breakfast. Still has minimal movement of his left arm/hand, is able to left rue up and hold, but generally very weak.  Patient states that he feels the same as he did yesterday and that this feels like one of his usual flare-ups. CK is trending down with IVF. Baclofen was increased yesterday by primary to 56m Q6h. Patient continues to take  Roxicodone 558mQ4hours for left-sided pain.   Vitals   Vitals:   07/31/22 2119 07/31/22 2347 08/01/22 0118 08/01/22 0437  BP: (!) 154/82 (!) 105/52 117/60 127/67  Pulse: 90 (!) 101 91 67  Resp: 18 18 18 14  $ Temp: (!) 101.7 F (38.7 C) (!) 102.2 F (39 C) 99.1 F (37.3 C) 99 F (37.2 C)  TempSrc: Oral Oral Oral Oral  SpO2: 99% 96% 92% 94%  Weight:      Height:         Body mass index is 24.81 kg/m.  Physical Exam   General: Ill-appearing, Laying comfortably in bed; in no acute distress.  HENT: Normal oropharynx and mucosa. Normal external appearance of ears and nose.  Neck: Supple, no pain or tenderness  CV: No JVD. No peripheral edema.  Pulmonary: Symmetric Chest rise. Normal respiratory effort.  Abdomen: Soft to touch, non-tender. Ext: No cyanosis, edema, or deformity  Skin: No rash. Normal palpation of skin.   Musculoskeletal: Normal digits and nails by inspection.   Neurologic Examination  Mental status/Cognition: Alert, oriented to self, place, month and year, good attention.  Speech/language: Slow speech, Fluent, comprehension intact, object naming intact, repetition intact.  Cranial nerves:   CN II L pupil slightly larger than right, both react, no VF deficits    CN III,IV,VI Slightly dysconjugate gaze   CN V normal sensation in V1, V2, and V3 segments bilaterally    CN VII Right facial weakness   CN VIII  normal hearing to speech   CN IX & X normal palatal elevation, no uvular deviation    CN XI head turn and shoulder shrug bilaterally weak   CN XII midline tongue protrusion    Motor:  Bulk and tone normal. No tremor seen.  RUE 4-/5, little effort against resistance, no drift, needs effort coaching to lift up LUE 2-3/5, able to flex wrist and slightly lift off bed, weakened grip RLE 3/5,  needs effort coaching to lift up LLE: 2/5 proximally, 3/5 distally.    Sensation: Intact and symmetric to light touch throughout  Coordination/Complex Motor:  -  Finger to Nose: slow but intact on right, uta on left  - Gait: deferred  Labs   Basic Metabolic Panel:  Lab Results  Component Value Date   NA 137 08/01/2022   K 3.9 08/01/2022   CO2 27 08/01/2022   GLUCOSE 101 (H) 08/01/2022   BUN 10 08/01/2022   CREATININE 0.88 08/01/2022   CALCIUM 8.3 (L) 08/01/2022   GFRNONAA >60 08/01/2022   GFRAA 76 04/08/2020   LDL:  Lab Results  Component Value Date   LDLCALC 114 (H) 04/13/2021   Urine Drug Screen:     Component Value Date/Time   LABOPIA NONE DETECTED 02/09/2014 1618   COCAINSCRNUR NONE DETECTED 02/09/2014 1618   LABBENZ NONE DETECTED 02/09/2014 1618   AMPHETMU NONE DETECTED 02/09/2014 1618   THCU POSITIVE (A) 02/09/2014 1618   LABBARB NONE DETECTED 02/09/2014 1618    Alcohol Level     Component Value Date/Time   ETH <10 07/30/2022 0609   No results found for: "PHENYTOIN", "ZONISAMIDE", "LAMOTRIGINE", "LEVETIRACETA" No results found for: "PHENYTOIN", "PHENOBARB", "VALPROATE", "CBMZ"  Imaging and Diagnostic studies  Results for orders placed during the hospital encounter of 07/11/22  MR BRAIN WO CONTRAST  Narrative CLINICAL DATA:  Initial evaluation for multiple sclerosis.  EXAM: MRI HEAD WITHOUT CONTRAST  TECHNIQUE: Multiplanar, multiecho pulse sequences of the brain and surrounding structures were obtained without intravenous contrast.  COMPARISON:  Prior MRI from 06/04/2021.  FINDINGS: Brain: Diffuse prominence of the CSF containing spaces, consistent with atrophy, advanced for age. Again seen are extensive patchy and confluent T2/FLAIR signal abnormality involving the periventricular, deep, and juxta cortical white matter both cerebral hemispheres. Patchy involvement of the descending white matter tracks, brainstem, and cerebellum. Overall, appearance is consistent with provided history of multiple sclerosis. These changes are progressed as compared to most recent MRI from 06/04/2021, with a few new  and/or larger lesions (series 11, image 19 at the right frontal centrum semi ovale for example). Several of these lesions demonstrate prominent diffusion signal abnormality. While this could be related to T2 shine through, overall appearance is suspicious for possible active demyelination, although evaluation limited by lack of IV contrast.  No evidence for acute or subacute infarct. Gray-white matter differentiation otherwise maintained. No other areas of chronic cortical infarction. No acute or chronic intracranial blood products.  No mass lesion, midline shift or mass effect. No hydrocephalus or extra-axial fluid collection. Pituitary gland and suprasellar region within normal limits.  Vascular: Major intracranial vascular flow voids are maintained.  Skull and upper cervical spine: Cranial junctional limits. Bone marrow signal intensity normal. No scalp soft tissue abnormality.  Sinuses/Orbits: Globes orbital soft tissues within normal limits. Paranasal sinuses are largely clear. No mastoid effusion.  Other: None.  IMPRESSION: 1. Extensive T2/FLAIR signal abnormality involving the cerebral white matter as above, consistent with provided history of multiple sclerosis. Overall, these changes are progressed as  compared to most recent MRI from 06/04/2021. Associated prominent diffusion signal abnormality about many of these lesions. While this could be related to T2 shine through, overall appearance is suspicious for possible active demyelination. Correlation with dedicated postcontrast imaging could be performed for confirmatory purposes as warranted. 2. Underlying advanced cerebral atrophy for age.   Electronically Signed By: Jeannine Boga M.D. On: 07/11/2022 20:03  __________________________   Thank you for the opportunity to take part in the care of this patient. If you have any further questions, please contact the neurology consultation  attending.  Signed,  Pt seen by Neuro NP/APP and later by MD. Note/plan to be edited by MD as needed.    Otelia Santee, DNP, AGACNP-BC [facility] Please use AMION for pager and EPIC for messaging    Impression   Bradley Brandt is a 42 y.o. male with PMH significant for relapsing/remitting MS (Recent missed doses of Tysabri), Bipolar 1 disorder, GAD, and insomnia.  He likely is suffering from tysabri rebound due to missing his doses. He continues to have symptoms but is subjectively stable over the past few days at least. The new lesions on MRI, though consistent with his symptoms over the past month, are not clearly meaning that he is still having progression since his last dose of steroids either(finished oral course this weekend).   Salvage therapy in MS flares after steroids is fairly limited. Plasmapheresis has the most extensive data, however this would remove his tysabri, which given that tysabri rebound due to missing doses is playing a large role, I would be very hesitant to do this. IVIG has very limited data but is sometimes used, and could be considered if he were to have continued to progress.   With fevers, I would be hesitant to pursue immune suppression,especially given that fevers will cause worsening of his symptoms irrespective of cerebral inflammation.   At this point, I would favor continued observation and therapy as well as infectious workup.    Recommendations   - hold off on further steroids.  - additional infectious work-up per primary team -continue PT/OT - will follow.   Roland Rack, MD [facility] [phone]  If 7pm- 7am, please page neurology on call as listed in [place].

## 2022-08-01 NOTE — Progress Notes (Signed)
Physical Therapy Treatment Patient Details Name: Bradley Brandt MRN: [id number] DOB: 1981-02-02 Today's Date: 08/01/2022   History of Present Illness Pt is a 42 y.o. male presents to [facility] hospital on 2/11 with worsening generalized weakness.  Admitted for rhabdomyolysis. Noted pt with hx of MS and multiple recent falls, recently dc'd for [facility] 1/30 for MS flare. PMH includes relapsing remitting MS, bipolar disorder, GAD, insomnia.    PT Comments    Continuing work on functional mobility and activity tolerance;  Showing noteworthy progress with today's session, telling therapists that even though he is in pain, he "want(s) to recover, and will try anything we ask" him to do; Session focused on upright sitting posture, strength, and balance; Bradley Brandt was much better able to tolerate sitting EOB, overall sitting up 12-15 minutes with Max assist/support majority of time sitting, but able to pull trunk forward for two brief bouts in approaching unsupported sitting;   Bradley Brandt seems very interested in getting a specialty mobility/wheelchair evaluation; We will first need to get a better idea of the type of manual wc he already has; Also, I recommend giving it some time before pursuing a specialty wc to see how much function Bradley Brandt recovers once this MS flare is resolved;   REcommend a PRAFO/resting foot splint with a walking sole to preserve ankle dorsiflexion range, and potentially help with foot placement and stability in transfers   Recommendations for follow up therapy are one component of a multi-disciplinary discharge planning process, led by the attending physician.  Recommendations may be updated based on patient status, additional functional criteria and insurance authorization.  Follow Up Recommendations  Skilled nursing-short term rehab (<3 hours/day) Can patient physically be transported by private vehicle: No   Assistance Recommended at Discharge Frequent or constant Supervision/Assistance  Patient  can return home with the following Two people to help with walking and/or transfers;Two people to help with bathing/dressing/bathroom;Assistance with cooking/housework;Assistance with feeding;Direct supervision/assist for medications management;Assist for transportation;Direct supervision/assist for financial management;Help with stairs or ramp for entrance   Equipment Recommendations  Hospital bed (mechanical lift; consider a specialty seating and mobiltiy evaluation)    Recommendations for Other Services       Precautions / Restrictions Precautions Precautions: Fall     Mobility  Bed Mobility Overal bed mobility: Needs Assistance Bed Mobility: Supine to Sit     Supine to sit: +2 for physical assistance, Total assist Sit to supine: +2 for physical assistance, Total assist   General bed mobility comments: Pt reports rolling is painful, so we opted to assist pt to EOB and back to bed via helicopter-stylerotation    Transfers Overall transfer level: Needs assistance Equipment used: 2 person hand held assist (and bed pad) Transfers: Bed to chair/wheelchair/BSC       Squat pivot transfers: +2 physical assistance, Max assist     General transfer comment: simulated squat pivot transfer at EOB, pivoting up towards Bradley Brandt; 2 person dependent transfer, bil knees blocked for stability; better ability to flex hips to weight shift than last admission    Ambulation/Gait                   Stairs             Wheelchair Mobility    Modified Rankin (Stroke Patients Only)       Balance     Sitting balance-Leahy Scale: Zero Sitting balance - Comments: Consistent Max assist required for posterior support sitting EOB; Able to actively initaite forward flexion/weight  shift of trunk with hold for apporx 10 seconds; worked on trunk mobility and stability seated EOB, as well as upright sitting with anterior pelvic tilt                                     Cognition Arousal/Alertness: Awake/alert Behavior During Therapy: WFL for tasks assessed/performed Overall Cognitive Status: No family/caregiver present to determine baseline cognitive functioning                                 General Comments: Participating well and following commands related to functional mobility with incr time        Exercises      General Comments General comments (skin integrity, edema, etc.): NAD on Room Air      Pertinent Vitals/Pain Pain Assessment Pain Assessment: Faces Faces Pain Scale: Hurts little more Pain Location: B LEs with movement, trunk with stability and mobility activities Pain Descriptors / Indicators: Grimacing, Guarding Pain Intervention(s): Monitored during session, Repositioned    Home Living                          Prior Function            PT Goals (current goals can now be found in the care plan section) Acute Rehab PT Goals Patient Stated Goal: Indicated he wants to work with PT, even if it's painful PT Goal Formulation: With patient Time For Goal Achievement: 08/13/22 Potential to Achieve Goals: Fair Progress towards PT goals: Progressing toward goals    Frequency    Min 3X/week      PT Plan Current plan remains appropriate    Co-evaluation              AM-PAC PT "6 Clicks" Mobility   Outcome Measure  Help needed turning from your back to your side while in a flat bed without using bedrails?: A Lot Help needed moving from lying on your back to sitting on the side of a flat bed without using bedrails?: Total Help needed moving to and from a bed to a chair (including a wheelchair)?: Total Help needed standing up from a chair using your arms (e.g., wheelchair or bedside chair)?: Total Help needed to walk in hospital room?: Total Help needed climbing 3-5 steps with a railing? : Total 6 Click Score: 7    End of Session Equipment Utilized During Treatment: Other (comment) (bed  pad) Activity Tolerance: Patient tolerated treatment well Patient left: in bed;with call bell/phone within reach (bed in semi-chair position) Nurse Communication: Mobility status PT Visit Diagnosis: Other abnormalities of gait and mobility (R26.89);Pain;Repeated falls (R29.6);Muscle weakness (generalized) (M62.81) Pain - Right/Left:  (bil) Pain - part of body: Leg     Time: [id number] PT Time Calculation (min) (ACUTE ONLY): 32 min  Charges:  $Therapeutic Activity: 23-37 mins                     Roney Marion, PT  Acute Rehabilitation Services Office [phone]    Colletta [place] 08/01/2022, 4:41 PM

## 2022-08-02 DIAGNOSIS — G35 Multiple sclerosis: Secondary | ICD-10-CM | POA: Diagnosis not present

## 2022-08-02 LAB — RESPIRATORY PANEL BY PCR

## 2022-08-02 LAB — BASIC METABOLIC PANEL
Anion gap: 7 (ref 5–15)
BUN: 10 mg/dL (ref 6–20)
CO2: 28 mmol/L (ref 22–32)
Calcium: 8.6 mg/dL — ABNORMAL LOW (ref 8.9–10.3)
Chloride: 104 mmol/L (ref 98–111)
Creatinine, Ser: 0.94 mg/dL (ref 0.61–1.24)
GFR, Estimated: >60 mL/min (ref >60)
Glucose, Bld: 113 mg/dL — ABNORMAL HIGH (ref 70–99)
Potassium: 3.9 mmol/L (ref 3.5–5.1)
Sodium: 139 mmol/L (ref 135–145)

## 2022-08-02 LAB — CBC
HCT: 38.7 % — ABNORMAL LOW (ref 39.0–52.0)
Hemoglobin: 13.5 g/dL (ref 13.0–17.0)
MCH: 32.5 pg (ref 26.0–34.0)
MCHC: 34.9 g/dL (ref 30.0–36.0)
MCV: 93 fL (ref 80.0–100.0)
Platelets: 159 10*3/uL (ref 150–400)
RBC: 4.16 MIL/uL — ABNORMAL LOW (ref 4.22–5.81)
RDW: 13.6 % (ref 11.5–15.5)
WBC: 10.3 10*3/uL (ref 4.0–10.5)
nRBC: 0.3 % — ABNORMAL HIGH (ref 0.0–0.2)

## 2022-08-02 MED ORDER — ENSURE ENLIVE PO LIQD
237.0000 mL | Freq: Three times a day (TID) | ORAL | Status: DC
  Administered 2022-08-02 – 2022-08-09 (×17): 237 mL via ORAL

## 2022-08-02 MED ORDER — BACLOFEN 10 MG PO TABS
20.0000 mg | ORAL_TABLET | Freq: Three times a day (TID) | ORAL | Status: DC
  Administered 2022-08-02 – 2022-08-03 (×3): 20 mg via ORAL
  Filled 2022-08-02 (×3): qty 2

## 2022-08-02 NOTE — Progress Notes (Signed)
Initial Nutrition Assessment  DOCUMENTATION CODES:   Not applicable  INTERVENTION:  - Add Ensure Enlive po TID, each supplement provides 350 kcal and 20 grams of protein.  - Add MVI q day.   NUTRITION DIAGNOSIS:  No nutrition diagnosis at this time.   MONITOR:   PO intake  REASON FOR ASSESSMENT:   Malnutrition Screening Tool    ASSESSMENT:   42 y.o. male admits related to generalized weakness. PMH includes: relapsing MS, Bipolar 1 disorder, GAD, and insomnia. Pt is currently receiving medical management related to rhabdomyolysis.  Meds reviewed: Vit D. Labs reviewed.   Pt had difficulty communicating with RD. However he nodded "yes" when asked if he was eating well. Per record, pt has eaten 75-100% of his meals since admission. He nodded head "no" when asked if he has any wt loss recently. No significant wt loss per record. Pt would like Ensure with meals. RD will add supplements and continue to monitor PO intakes.   NUTRITION - FOCUSED PHYSICAL EXAM:  WDL   Diet Order:   Diet Order             Diet regular Room service appropriate? Yes; Fluid consistency: Thin  Diet effective now                   EDUCATION NEEDS:   Not appropriate for education at this time  Skin:  Skin Assessment: Reviewed RN Assessment  Last BM:  07/29/22  Height:   Ht Readings from Last 1 Encounters:  07/30/22 5' 11"$  (1.803 m)    Weight:   Wt Readings from Last 1 Encounters:  07/30/22 80.7 kg    Ideal Body Weight:     BMI:  Body mass index is 24.81 kg/m.  Estimated Nutritional Needs:   Kcal:  2015-2420 kcals  Protein:  100-120 gm  Fluid:  >/= 2 L  Thalia Bloodgood, RD, LDN, CNSC.

## 2022-08-02 NOTE — Progress Notes (Signed)
Occupational Therapy Treatment Patient Details Name: Bradley Brandt MRN: [id number] DOB: 1981-06-01 Today's Date: 08/02/2022   History of present illness Pt is a 41 y.o. male presents to [facility] on [facility]/11 with worsening generalized weakness.  Admitted for rhabdomyolysis. Noted pt with hx of MS and multiple recent falls, recently dc'd for [facility] 1/30 for MS flare. MRI 2/12 with "multifocal progressed and active demyelinating disease since MRI last month, most notably in L brainstem and R centrum semiovale". PMH includes relapsing remitting MS, bipolar disorder, GAD, insomnia.   OT comments  Pt upright in bed, reports having finished breakfast (nursing assisting with feeding). He is fatigued but motivated to work with OT.  Patient educated on positioning of L UE due to decreased functional use, edema in hand; spoke to MD and ordered resting hand splint for UE (will assess fit and set schedule tomorrow).  Patient demonstrates difficulty using R UE to command, even after initiation of tasks; weakness distally pronounced with wrist flexion when activating shoulder/elbow for tasks leading to dropping items frequently. After exercises/ROM to R UE noted increased tone.  Overall, requires mod to max assist for grooming upright in bed with noted L lateral lean even when supported. Pt really needs further visual assessment as well.  Will follow acutely, highly recommend SNF.     Recommendations for follow up therapy are one component of a multi-disciplinary discharge planning process, led by the attending physician.  Recommendations may be updated based on patient status, additional functional criteria and insurance authorization.    Follow Up Recommendations  Skilled nursing-short term rehab (<3 hours/day)     Assistance Recommended at Discharge Frequent or constant Supervision/Assistance  Patient can return home with the following  Two people to help with walking and/or transfers;Two people to help with  bathing/dressing/bathroom;Assistance with cooking/housework;Direct supervision/assist for medications management;Assistance with feeding;Direct supervision/assist for financial management;Assist for transportation;Help with stairs or ramp for entrance   Equipment Recommendations  Other (comment) (defer)    Recommendations for Other Services      Precautions / Restrictions Precautions Precautions: Fall Restrictions Weight Bearing Restrictions: No       Mobility Bed Mobility               General bed mobility comments: repositioned on bed with total assist, chair position    Transfers                         Balance Overall balance assessment: Needs assistance     Sitting balance - Comments: supported upright in bed with L lateral lean                                   ADL either performed or assessed with clinical judgement   ADL Overall ADL's : Needs assistance/impaired     Grooming: Moderate assistance;Maximal assistance;Bed level Grooming Details (indicate cue type and reason): upright in bed with mod assist to wash face for proximal support, max assist for oral care (red foam handle on toothbrush)             Lower Body Dressing: Total assistance;+2 for physical assistance;+2 for safety/equipment;Bed level               Functional mobility during ADLs: Total assistance General ADL Comments: to reposition in bed    Extremity/Trunk Assessment Upper Extremity Assessment Upper Extremity Assessment: RUE deficits/detail;LUE deficits/detail RUE Deficits /  Details: remains 3+/5 but apraxic, pt able to functionally move UE but when asked to compelte exercises or use UE do a task (brush teeth, wash face) pt unable to coodinate.  Some wrist weakness noted when attempting to use arm functionally. RUE Coordination: decreased fine motor;decreased gross motor LUE Deficits / Details: grossly weak, able to grasp fingers today but otherwise  no AROM.  elbow ROM NT LUE Sensation: decreased light touch LUE Coordination: decreased fine motor;decreased gross motor            Vision       Perception     Praxis      Cognition Arousal/Alertness: Awake/alert Behavior During Therapy: WFL for tasks assessed/performed Overall Cognitive Status: No family/caregiver present to determine baseline cognitive functioning Area of Impairment: Problem solving, Following commands, Memory, Attention, Safety/judgement, Awareness                   Current Attention Level: Focused, Sustained Memory: Decreased recall of precautions, Decreased short-term memory Following Commands: Follows one step commands consistently, Follows one step commands with increased time, Follows multi-step commands inconsistently Safety/Judgement: Decreased awareness of safety, Decreased awareness of deficits Awareness: Emergent Problem Solving: Slow processing, Decreased initiation, Difficulty sequencing, Requires verbal cues General Comments: pt alert and engaging initally but fatigues easily.  He presents with slow processing, poor problem solving and difficulty sequencing tasks.        Exercises Exercises: Other exercises Other Exercises Other Exercises: BUE PROM to AA/AROM as tolerated x 5 reps    Shoulder Instructions       General Comments      Pertinent Vitals/ Pain       Pain Assessment Pain Assessment: Faces Faces Pain Scale: Hurts even more Pain Location: BLEs, trunk, L UE Pain Descriptors / Indicators: Grimacing, Guarding Pain Intervention(s): Limited activity within patient's tolerance, Monitored during session, Repositioned  Home Living                                          Prior Functioning/Environment              Frequency  Min 3X/week        Progress Toward Goals  OT Goals(current goals can now be found in the care plan section)  Progress towards OT goals: Progressing toward  goals  Acute Rehab OT Goals Patient Stated Goal: get stronger OT Goal Formulation: With patient Time For Goal Achievement: 08/13/22 Potential to Achieve Goals: Good  Plan Discharge plan remains appropriate;Frequency remains appropriate    Co-evaluation                 AM-PAC OT "6 Clicks" Daily Activity     Outcome Measure   Help from another person eating meals?: A Lot Help from another person taking care of personal grooming?: A Lot Help from another person toileting, which includes using toliet, bedpan, or urinal?: Total Help from another person bathing (including washing, rinsing, drying)?: A Lot Help from another person to put on and taking off regular upper body clothing?: A Lot Help from another person to put on and taking off regular lower body clothing?: Total 6 Click Score: 10    End of Session    OT Visit Diagnosis: Other abnormalities of gait and mobility (R26.89);Muscle weakness (generalized) (M62.81);History of falling (Z91.81);Other symptoms and signs involving the nervous system (R29.898);Pain   Activity Tolerance Patient limited  by fatigue   Patient Left in bed;with call bell/phone within reach;with bed alarm set   Nurse Communication Mobility status;Precautions        Time: [id number] OT Time Calculation (min): 37 min  Charges: OT General Charges $OT Visit: 1 Visit OT Treatments $Self Care/Home Management : 8-22 mins $Neuromuscular Re-education: 8-22 mins  Jolaine Artist, [place] [phone]   Delight Stare 08/02/2022, 1:10 PM

## 2022-08-02 NOTE — Progress Notes (Signed)
Orthopedic Tech Progress Note Patient Details:  Bradley Brandt 1980/07/02 [id number]  Called in  order to [facility] for a RESTING HAND SPLINT   Patient ID: Bradley Brandt, male   DOB: [birth year], 42 y.o.   MRN: [id number]  Bradley Brandt 08/02/2022, 1:17 PM

## 2022-08-02 NOTE — Progress Notes (Addendum)
Subjective: No significant changes  Exam: Vitals:   08/02/22 1140 08/02/22 1625  BP: 138/70 131/71  Pulse: 69 69  Resp: 17 16  Temp: 98.4 F (36.9 C) 98.7 F (37.1 C)  SpO2: 91% 95%   Gen: In bed, NAD Resp: non-labored breathing, no acute distress Abd: soft, nt  Neuro: MS: Awake, alert, he has significant difficulty with expressive aphasia, but with deliberate effort he is able to get his point across. CN: He has a slightly disconjugate gaze with a right hypometria, right facial weakness. Motor: He has 4/5 strength of the right upper extremity, able to bend his knee slightly in his right lower extremity, but gives poor effort today.  Left lower extremity he is unable to lift, but has 3-4/5 plantar/dorsiflexion.  Left upper extremity with 3/5 elbow extension, 2/5 internal rotation 2/5 grip.  Unable to lift against gravity. Sensory: He reports diminished sensation on the left   Pertinent Labs: CBC with no leukocytosis  Impression: 42 year old male with a history of MS and severe relapse in the setting of Tysabri rebound.  His exam this seems essentially stable and I would not favor any further salvage therapy at this time given that plasma exchange would remove his Tysabri.  He has been afebrile now for 2 days, and has no signs of CNS infection.  I do not think we would have reason to expect him to continue to have benefit from steroids and especially given his recent fevers, I am worried we would be potentially putting him at more risk of infection.  Recommendations: 1) continue PT/OT 2) likely will need prolonged rehab.  Roland Rack, MD [facility] [phone]  If 7pm- 7am, please page neurology on call as listed in [place].

## 2022-08-02 NOTE — Progress Notes (Cosign Needed Addendum)
                 Interval history No new acute concerns this morning. Denies cough, sore throat, shortness of breath, nausea, abdominal pain, diarrhea. Reports back pain. Ate a portion of his breakfast this morning.  On reassessment in the afternoon, the patient appears much improved. He is very much awake, having just finished a lunch of spaghetti. He expresses frustration with his inability to speak fluently. He is accompanied by two friends who seem to be lifting his spirits.  Physical exam Blood pressure 138/86, pulse (!) 107, temperature 99.7 F (37.6 C), temperature source Oral, resp. rate 18, height 5\' 11"  (1.803 m), weight 80.7 kg, SpO2 97 %.  Deconditioned-appearing, semi-reclined in bed Oral mucous membranes moist No appreciable thyromegaly, neck is supple Regular tachycardia, bilateral radial pulses 2+, no JVD appreciated on exam Breathing is regular and unlabored, anterior lung fields clear to auscultation Abdomen is non-tender and non-distended Skin is warm and moist No CVA tenderness, no lower extremity swelling Alert and oriented to person, place, and reason for hospitalization, decreased fluency, dysarthria  Assessment and plan Hospital day 3  Bradley Brandt is a 42 y.o. with history of MS admitted after a fall complicated by rhabdomyolysis with elevated liver enzymes.  Principal Problem:   Rhabdomyolysis Active Problems:   Multiple sclerosis flare   Relapsing remitting multiple sclerosis (HCC)   Acute urinary retention   Fever  Multiple sclerosis Remains grossly weak and with mild expressive aphasia. Other complications include urinary retention and back muscle spasms. Showed progress during session with PT yesterday, they recommend SNF level rehabilitation.  OT recommends a resting hand splint for left upper extremity.  Appreciate neurology assistance with this complex case. - Continue baclofen 20 mg every 8 hours - Continue gabapentin 800 mg twice daily -  Oxycodone 5 mg every 4 hours as needed for severe pain - Maintain coud catheter - PT/OT  Fever, resolved Tmax of 99.7 F last 24 hours.  Also with some tachycardia on exam this morning, resolved by afternoon.  Neck is supple.  No leukocytosis.  No appreciable localizing signs or symptoms.  Reviewed med list, no fever causing medications on MAR. Will test for respiratory viruses today.  Also considered PE, but this is less likely without dyspnea or hypoxia. No role for empiric antibiotics at this time. Low index of suspicion for CNS infection, patient is not encephalopathic or ill-appearing on exam. Continue to follow fever curve and vital signs closely. - Follow-up viral swab  Rhabdomyolysis, improving Complicated by elevated liver enzymes.  No renal injury.  CK trending down nicely.  No signs of volume overload with fluid administration.  Can decrease rate of fluids although will keep some maintenance IV fluids running because of decreased oral intake due to debility from MS. - LR IV infusion at 125 mL/h  Bipolar 1 disorder Generalized anxiety disorder Continue home medications. - Duloxetine 30 mg daily - Quetiapine 200 mg nightly  Diet: Regular IVF: LR at 125 mL/h VTE: Enoxaparin Code: Full PT/OT recommendations: SNF level rehab, resting hand splint TOC recommendations: SNF bed search ongoing  Discharge plan: Anticipate discharge to SNF pending continued medical workup  Bradley Ludlum MD 08/02/2022, 6:29 AM  Pager: [phone] After 5pm on weekdays and 1pm on weekends: [phone]

## 2022-08-02 NOTE — TOC Progression Note (Signed)
Transition of Care [facility]) - Initial/Assessment Note    Patient Details  Name: ARRIAN KNOCH MRN: [id number] Date of Birth: Oct 08, 1980  Transition of [facility]) CM/SW Contact:    Milinda Antis, LCSWA Phone Number: 08/02/2022, 3:06 PM  Clinical Narrative:                 No bed offers at this time.  TOC following.  Expected Discharge Plan: Skilled Nursing Facility Barriers to Discharge: SNF Pending bed offer, Continued Medical Work up   Patient Goals and CMS Choice            Expected Discharge Plan and Services       Living arrangements for the past 2 months: Single Family Home                                      Prior Living Arrangements/Services Living arrangements for the past 2 months: Single Family Home Lives with:: Self Patient language and need for interpreter reviewed:: Yes        Need for Family Participation in Patient Care: Yes (Comment) Care giver support system in place?: Yes (comment)   Criminal Activity/Legal Involvement Pertinent to Current Situation/Hospitalization: No - Comment as needed  Activities of Daily Living Home Assistive Devices/Equipment: Cane (specify quad or straight), Walker (specify type) (straight cane and front wheel walker) ADL Screening (condition at time of admission) Patient's cognitive ability adequate to safely complete daily activities?: Yes Is the patient deaf or have difficulty hearing?: No Does the patient have difficulty seeing, even when wearing glasses/contacts?: No Does the patient have difficulty concentrating, remembering, or making decisions?: No Patient able to express need for assistance with ADLs?: Yes Does the patient have difficulty dressing or bathing?: No Independently performs ADLs?: Yes (appropriate for developmental age) Does the patient have difficulty walking or climbing stairs?: Yes Weakness of Legs: Both Weakness of Arms/Hands: Both  Permission Sought/Granted                   Emotional Assessment Appearance:: Appears stated age Attitude/Demeanor/Rapport: Lethargic Affect (typically observed): Accepting Orientation: : Oriented to Self, Oriented to Place, Oriented to  Time, Oriented to Situation Alcohol / Substance Use: Not Applicable Psych Involvement: No (comment)  Admission diagnosis:  Rhabdomyolysis [M62.82] Weakness [R53.1] Thrombocytopenia (HCC) [D69.6] Elevated transaminase level [R74.01] Normochromic normocytic anemia [D64.9] Non-traumatic rhabdomyolysis [M62.82] Patient Active Problem List   Diagnosis Date Noted   Acute urinary retention 08/01/2022   Fever 08/01/2022   Rhabdomyolysis 07/30/2022   Multiple sclerosis exacerbation ([place]) 07/12/2022   Marijuana use 11/22/2020   History of marijuana use 11/22/2020   Chronic pain syndrome 11/21/2020   Pharmacologic therapy 11/21/2020   Disorder of skeletal system 11/21/2020   Problems influencing health status 11/21/2020   MS (multiple sclerosis) ([place])    Neuromuscular disorder (HCC)    Bipolar disorder in full remission ([place]) 11/16/2020   Other insomnia 08/11/2020   Generalized anxiety disorder 08/11/2020   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[place]) 12/31/2017   Optic neuritis 08/02/2017   High risk medication use 08/02/2017   Vitamin D deficiency 09/19/2016   Hypercholesterolemia 09/19/2016   Numbness 12/16/2015   Spastic gait 12/16/2015   Urinary hesitancy 12/16/2015   Low back pain with sciatica 09/28/2014   Polysubstance dependence ([place]) 02/09/2014   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 [id number] - [place], [place] [address] Phone: [phone] Fax: [phone]  [facility] [place] [place] [address] [place] [address] Phone: [phone] Fax: [phone]  [id number] [place] [address]Phone: [phone]: [phone]    Social Determinants of Health (SDOH) Social History: [place] Food Insecurity Present (07/31/2022)  Housing: Low Risk  (07/31/2022)  Transportation Needs: No Transportation Needs (07/31/2022)  Utilities: Not At Risk (07/31/2022)  Depression (PHQ2-9): Low Risk  (10/12/2021)  Recent Concern: Depression (PHQ2-9) - Medium Risk (08/04/2021)  Tobacco Use: High Risk (07/30/2022)   SDOH Interventions:     Readmission Risk Interventions     No data to display

## 2022-08-03 DIAGNOSIS — G35 Multiple sclerosis: Secondary | ICD-10-CM | POA: Diagnosis not present

## 2022-08-03 LAB — PROTEIN AND GLUCOSE, CSF
Glucose, CSF: 76 mg/dL — ABNORMAL HIGH (ref 40–70)
Total  Protein, CSF: 44 mg/dL (ref 15–45)

## 2022-08-03 LAB — CBC
HCT: 39.2 % (ref 39.0–52.0)
Hemoglobin: 13.5 g/dL (ref 13.0–17.0)
MCH: 31.8 pg (ref 26.0–34.0)
MCHC: 34.4 g/dL (ref 30.0–36.0)
MCV: 92.2 fL (ref 80.0–100.0)
Platelets: 185 10*3/uL (ref 150–400)
RBC: 4.25 MIL/uL (ref 4.22–5.81)
RDW: 13.5 % (ref 11.5–15.5)
WBC: 10 10*3/uL (ref 4.0–10.5)
nRBC: 0.4 % — ABNORMAL HIGH (ref 0.0–0.2)

## 2022-08-03 LAB — MENINGITIS/ENCEPHALITIS PANEL (CSF)

## 2022-08-03 LAB — CSF CELL COUNT WITH DIFFERENTIAL
RBC Count, CSF: 3 /mm3 — ABNORMAL HIGH
RBC Count, CSF: 4 /mm3 — ABNORMAL HIGH
Tube #: 1
Tube #: 4
WBC, CSF: 8 /mm3 — ABNORMAL HIGH (ref 0–5)
WBC, CSF: 8 /mm3 — ABNORMAL HIGH (ref 0–5)

## 2022-08-03 LAB — RESP PANEL BY RT-PCR (RSV, FLU A&B, COVID)  RVPGX2
Influenza A by PCR: NEGATIVE
Influenza B by PCR: NEGATIVE
Resp Syncytial Virus by PCR: NEGATIVE
SARS Coronavirus 2 by RT PCR: NEGATIVE

## 2022-08-03 LAB — GLUCOSE, CAPILLARY: Glucose-Capillary: 126 mg/dL — ABNORMAL HIGH (ref 70–99)

## 2022-08-03 MED ORDER — ACETAMINOPHEN 325 MG PO TABS
650.0000 mg | ORAL_TABLET | Freq: Four times a day (QID) | ORAL | Status: DC
  Administered 2022-08-03 – 2022-08-04 (×3): 650 mg via ORAL
  Filled 2022-08-03 (×3): qty 2

## 2022-08-03 MED ORDER — BACLOFEN 10 MG PO TABS
20.0000 mg | ORAL_TABLET | Freq: Four times a day (QID) | ORAL | Status: DC
  Administered 2022-08-03 – 2022-08-10 (×23): 20 mg via ORAL
  Filled 2022-08-03 (×24): qty 2

## 2022-08-03 NOTE — Progress Notes (Addendum)
Subjective:   Patient evaluated at bedside. Appears tired today.  Primarily shaking his head yes or no in response to questioning.  Responding to commands appropriately.  Was able to tell me his name, that he was in the hospital, that is being treated for an MS flare.  He endorses difficulty speaking today.  He remembers feeling warm overnight.  Denies headache or neck pain.  Continues to have some muscle cramps and pain in his lower back. Experience pain when attempting to the range of motion testing of the right lower extremity.  And asked about the lump on his left posterior neck, he reports that this has been present prior to this hospitalization.  His significant other brought him chocolates for Valentine's Day yesterday.  Objective:  Vital signs in last 24 hours: Vitals:   08/02/22 1140 08/02/22 1625 08/02/22 2022 08/03/22 0527  BP: 138/70 131/71 (!) 149/75 (!) 143/76  Pulse: 69 69 72 83  Resp: 17 16 19 20  $ Temp: 98.4 F (36.9 C) 98.7 F (37.1 C) 98.2 F (36.8 C) (!) 100.4 F (38 C)  TempSrc:  Oral Oral Oral  SpO2: 91% 95% 100% 95%  Weight:      Height:       Physical Exam: General: appears uncomfortable, speaking in short sentences, slow to respond HENT: single, mobile swelling at the left posterior cervical chain suggestive of lymphadenopathy, no other enlarged lymph nodes of the head or neck CV: Regular rate, rhythm. No murmurs, rubs, or gallops. No LE swelling.  Pulm: Normal work of breathing on room air. No wheezing or crackles. Abdomen: Soft, non-tender, non-distended. Normoactive bowel sounds. MSK: increased muscle tone of upper and lower extremities worst in bilateral LE, no muscle spasm noted today, pain w/ ROM of the RLE Lymph: no axillary lymphadenopathy.  Neuro: Alert and oriented to person, place, but not time. Cranial nerves intact. 1/5 grip strength of the LUE. 3/5 grip strength of the RUE. 1/5 strength of bilateral LE.  Skin: Warm, dry. No  rash.  Assessment/Plan:  Principal Problem:   Rhabdomyolysis Active Problems:   Multiple sclerosis flare   Relapsing remitting multiple sclerosis (HCC)   Acute urinary retention   Fever   Bradley Brandt is 42yo male with relapsing/remitting multiple sclerosis, bipolar 1 disorder, generalized anxiety disorder, and insomnia admitted for rhabdomyolysis and generalized deconditioning likely secondary to progression of MS.   #Multiple sclerosis Continues to have diffuse weakness and mild expressive aphasia. Also w/ urinary retention and back spasms. Symptoms today continue to progress. Still having pain today. Plan for SNF and LUE resting hand splint.   - F/u neurology recommendations - Increase baclofen to 20 mg every 6 hours - Continue gabapentin 800 mg twice daily - Oxycodone 5 mg every 4 hours as needed for severe pain - Scheduled tylenol to q6 hours today - Maintain coud catheter - PT/OT   #Fever Was 2 days w/o fever but developed fever of 100.29F overnight. No tylenol was given. No leukocytosis. Not ill-appearing, normal lung exam, neck supple. CXR negative. RVP negative. Covid/influenza/RSV negative. UA not concerning for UTI. Blood cultures negative at 1 day. Low suspicion for CNS infection or pneumonia at this time. No empiric abx at this time given lack of sepsis or source of infection. Neuro performing LP today to assess for signs of CNS infection.  - Continue to trend fever curve/vital signs closely - F/u CSF cell count, gram stain, protein/glucose, fungal culture, meningitis/encephalitis panel   #Rhabdomyolysis, improving #Malnutrition CK continued to  trend down. Cr remains WNL. No signs of volume overload on exam. Still has decreased PO intake secondary to debility from MS but eating some today. Will hold off on IV fluids for now. May restart if he begins to show signs of dehydration.  - CMP to assess albumin   #Bipolar 1 disorder #Generalized anxiety disorder Continue  home medications. - Duloxetine 30 mg daily - Quetiapine 200 mg nightly   Diet: regular Bowel: senna VTE: Lovenox IVF: none Code: Full PT/OT recs: SNF, LUE resting hand splint     Prior to Admission Living Arrangement: at home alone Anticipated Discharge Location: TBD Barriers to Discharge: continued management Dispo: Anticipated discharge in approximately more than 2 day(s).    Starlyn Skeans, MD 08/03/2022, 5:51 AM Pager: [phone] After 5pm on weekdays and 1pm on weekends: On Call pager [phone]

## 2022-08-03 NOTE — Progress Notes (Signed)
Subjective: Reports worsening speech, feeling gradually weaker  Disoriented  Primary team concerned about oral intake, patient reports tolerating diet   Exam: Current vital signs: BP (!) 154/73 (BP Location: Right Arm)   Pulse 78   Temp 98.3 F (36.8 C) (Oral)   Resp 16   Ht 5' 11"$  (1.803 m)   Wt 80.7 kg   SpO2 91%   BMI 24.81 kg/m  Vital signs in last 24 hours: Temp:  [98.2 F (36.8 C)-100.4 F (38 C)] 98.3 F (36.8 C) (02/15 0853) Pulse Rate:  [69-83] 78 (02/15 0853) Resp:  [16-20] 16 (02/15 0853) BP: (131-154)/(71-76) 154/73 (02/15 0853) SpO2:  [91 %-100 %] 91 % (02/15 0853)   Gen: In bed, NAD HEENT: Purluent mucus in right nare Resp: non-labored breathing, no acute distress Abd: soft, nt  Neuro: MS: Awake, alert, he has significant difficulty with expressive aphasia, but with deliberate effort he is able to get his point across. Perseverates on prior answers at times, cannot state year or location.  CN: He has a slightly disconjugate gaze with a right hypometria, right facial weakness. Head is kept in right head tilt. Reports double vision, hearing intact to tuning fork (symmetric). Shoulder shrug weak on the left. Tongue midline. PERRL but sluggish.  Motor: He has 4/5 strength of the right upper extremity (4- deltoid, 4++ eblow flexion and extension, 4- finger extension, 4 finger flexion), able to bend his knee slightly in his right lower extremity, plantar flexion 4/5, dorisflexion 4-/5, 1/5 hip flexion.   Left lower extremity he is unable to lift, but has 4/5 plantar/dorsiflexion.  Left upper extremity with 3/5 elbow extension, 2/5 elbow flexion, 2/5 grip (flexion stronger than extension), 1/5 deltoid.  Unable to lift against gravity. Sensory: He reports symmetric sensation throughout today  Reflexes: 3+ and symmetric biceps brachioradials, + Hoffman's bilaterally. 2+ patellar brisker on the left than the right   Pertinent Labs: CBC with no leukocytosis  Impression:  42 year old male with a history of MS and severe relapse in the setting of Tysabri rebound. Today his effort is good but does seem weaker than prior. Febrile again without clear source, appreciate primary team workup. Noting paranasal inflammation on MRI brain will also add on expectorated sputum culture. Worsening may still be due to Tysabri rebound which rarely be progressive and life threatening, but will rule out infectious CNS process with CSF studies today. Attempted to reach family for consent given patient's disorientation but were unable to do so despite repeated attempts. Will perform procedure on an emergent basis given progression of symptoms  Recommendations: - LP with protein, glucose, cell counts tubes 1 & 4, bacterial and fungal smear / culture, Meningitis/enecephalitis biofire PCR panel  - Sputum culture - Neurology will continue to follow   [place] [phone] If 7pm- 7am, please page neurology on call as listed in [place].

## 2022-08-03 NOTE — Progress Notes (Signed)
Physical Therapy Treatment Patient Details Name: BARTLETT MUHLENKAMP MRN: [id number] DOB: May 06, 1981 Today's Date: 08/03/2022   History of Present Illness Pt is a 42 y.o. male presents to [facility] hospital on 2/11 with worsening generalized weakness.  Admitted for rhabdomyolysis. Noted pt with hx of MS and multiple recent falls, recently dc'd for [facility] 1/30 for MS flare. MRI 2/12 with "multifocal progressed and active demyelinating disease since MRI last month, most notably in L brainstem and R centrum semiovale". PMH includes relapsing remitting MS, bipolar disorder, GAD, insomnia.    PT Comments    Focused on bed mobility, increasing independence with repositioning and utilized maximove for transfer out of bed today. Able to hold bed rail with LUE and pull lightly to assist minimally with rolling in bed. Less back pain reported after repositioning. Pt nauseous after transfer but quickly improved, no notable drop in BP but had been sitting up a bit before we were able to check; he was diaphoretic briefly, the nausea quickly resolved with LEs elevated and repositioned in recliner. Patient will continue to benefit from skilled physical therapy services to further improve independence with functional mobility.    Recommendations for follow up therapy are one component of a multi-disciplinary discharge planning process, led by the attending physician.  Recommendations may be updated based on patient status, additional functional criteria and insurance authorization.  Follow Up Recommendations  Skilled nursing-short term rehab (<3 hours/day) Can patient physically be transported by private vehicle: No   Assistance Recommended at Discharge Frequent or constant Supervision/Assistance  Patient can return home with the following Two people to help with walking and/or transfers;Two people to help with bathing/dressing/bathroom;Assistance with cooking/housework;Assistance with feeding;Direct supervision/assist for  medications management;Assist for transportation;Direct supervision/assist for financial management;Help with stairs or ramp for entrance   Equipment Recommendations  Hospital bed (Pending progress - specialty seating and power w/c.)    Recommendations for Other Services       Precautions / Restrictions Precautions Precautions: Fall Restrictions Weight Bearing Restrictions: No     Mobility  Bed Mobility Overal bed mobility: Needs Assistance Bed Mobility: Rolling Rolling: Total assist, +2 for safety/equipment         General bed mobility comments: Pt able to facilitate reach with LUE but lifts arm minimally. Hand over hand assist to reach for rail. Good grip once placed on rail to pull through while +2 assist with roll. Pt fearful of falling when rolling. Pt able to faciliate very light LE movement when assisting to flex and extend LEs to improve roll efficiency.    Transfers Overall transfer level: Needs assistance   Transfers: Bed to chair/wheelchair/BSC             General transfer comment: used maximove to transfer to recliner with total assist +2 Transfer via Lift Equipment: Maximove  Ambulation/Gait                   Stairs             Wheelchair Mobility    Modified Rankin (Stroke Patients Only)       Balance Overall balance assessment: Needs assistance     Sitting balance - Comments: supported in recliner with L lean preference. Worked on repositioning as able. Minimal movements. Cues to lead with head to facilitate. Postural control: Left lateral lean  Cognition Arousal/Alertness: Awake/alert Behavior During Therapy: Flat affect Overall Cognitive Status: No family/caregiver present to determine baseline cognitive functioning Area of Impairment: Problem solving, Following commands, Memory, Attention, Safety/judgement, Awareness                   Current Attention Level:  Sustained, Focused Memory: Decreased short-term memory, Decreased recall of precautions Following Commands: Follows one step commands consistently, Follows one step commands with increased time, Follows multi-step commands inconsistently Safety/Judgement: Decreased awareness of safety, Decreased awareness of deficits Awareness: Emergent Problem Solving: Slow processing, Decreased initiation, Difficulty sequencing, Requires verbal cues, Requires tactile cues General Comments: pt with flat affect today.  Slow processing and requires increased time to engage/respond to thearpists. Difficulty attending to tasks.        Exercises Other Exercises Other Exercises: AA/PROM for ankle ROM.    General Comments General comments (skin integrity, edema, etc.): Repositioned in chair for comfort, UEs supported, Heels floating      Pertinent Vitals/Pain Pain Assessment Faces Pain Scale: Hurts whole lot Pain Location: back, LEs Pain Descriptors / Indicators: Grimacing, Guarding, Burning Pain Intervention(s): Monitored during session, Limited activity within patient's tolerance, Repositioned    Home Living                          Prior Function            PT Goals (current goals can now be found in the care plan section) Acute Rehab PT Goals PT Goal Formulation: With patient Time For Goal Achievement: 08/13/22 Potential to Achieve Goals: Fair Progress towards PT goals: Progressing toward goals    Frequency    Min 3X/week      PT Plan Current plan remains appropriate    Co-evaluation PT/OT/SLP Co-Evaluation/Treatment: Yes Reason for Co-Treatment: Complexity of the patient's impairments (multi-system involvement);To address functional/ADL transfers;For patient/therapist safety PT goals addressed during session: Mobility/safety with mobility;Strengthening/ROM OT goals addressed during session: ADL's and self-care      AM-PAC PT "6 Clicks" Mobility   Outcome Measure   Help needed turning from your back to your side while in a flat bed without using bedrails?: A Lot Help needed moving from lying on your back to sitting on the side of a flat bed without using bedrails?: Total Help needed moving to and from a bed to a chair (including a wheelchair)?: Total Help needed standing up from a chair using your arms (e.g., wheelchair or bedside chair)?: Total Help needed to walk in hospital room?: Total Help needed climbing 3-5 steps with a railing? : Total 6 Click Score: 7    End of Session Equipment Utilized During Treatment:  Harrel Lemon lift) Activity Tolerance: Patient tolerated treatment well Patient left: in chair;with call bell/phone within reach;with chair alarm set Nurse Communication:  (NT lift pad in place) PT Visit Diagnosis: Other abnormalities of gait and mobility (R26.89);Pain;Repeated falls (R29.6);Muscle weakness (generalized) (M62.81) Pain - part of body:  (back)     Time: [id number] PT Time Calculation (min) (ACUTE ONLY): 35 min  Charges:  $Therapeutic Activity: 8-22 mins                     Candie Mile, PT, DPT Physical Therapist Acute Rehabilitation Services [place]    Ellouise Newer 08/03/2022, 12:25 PM

## 2022-08-03 NOTE — Progress Notes (Signed)
Occupational Therapy Treatment Patient Details Name: Bradley Brandt MRN: [id number] DOB: 03/05/1981 Today's Date: 08/03/2022   History of present illness Pt is a 42 y.o. male presents to [facility] hospital on 2/11 with worsening generalized weakness.  Admitted for rhabdomyolysis. Noted pt with hx of MS and multiple recent falls, recently dc'd for [facility] 1/30 for MS flare. MRI 2/12 with "multifocal progressed and active demyelinating disease since MRI last month, most notably in L brainstem and R centrum semiovale". PMH includes relapsing remitting MS, bipolar disorder, GAD, insomnia.   OT comments  Pt supine in bed, fatigued but agreeable to OT/PT session.  Utilized maximove to transfer to recliner, once upright in chair pt washed face with mod assist for proximal support with increased time due to dropping wash cloth several times. Resting hand splint delivered, educated pt and RN on schedule and left in room.  Visual assessment attempted, but functionally noted R gaze preference and undershooting, pt reports diplopia with turning head to L and R.  Trial of partial occulusion glasses with patient reporting that they help!  Educated pt and RN on glasses, educated to use as tolerated but remove with any discomfort or when resting.  Will follow acutely.    Recommendations for follow up therapy are one component of a multi-disciplinary discharge planning process, led by the attending physician.  Recommendations may be updated based on patient status, additional functional criteria and insurance authorization.    Follow Up Recommendations  Skilled nursing-short term rehab (<3 hours/day)     Assistance Recommended at Discharge Frequent or constant Supervision/Assistance  Patient can return home with the following  Two people to help with walking and/or transfers;Two people to help with bathing/dressing/bathroom;Assistance with cooking/housework;Direct supervision/assist for medications management;Assistance with  feeding;Direct supervision/assist for financial management;Assist for transportation;Help with stairs or ramp for entrance   Equipment Recommendations  Other (comment) (defer)    Recommendations for Other Services Speech consult    Precautions / Restrictions Precautions Precautions: Fall Restrictions Weight Bearing Restrictions: No       Mobility Bed Mobility Overal bed mobility: Needs Assistance Bed Mobility: Rolling Rolling: Total assist, +2 for safety/equipment         General bed mobility comments: rolling for placement of lift pad, total assist +2    Transfers Overall transfer level: Needs assistance   Transfers: Bed to chair/wheelchair/BSC             General transfer comment: used maximove to transfer to recliner with total assist +2 Transfer via Lift Equipment: Maximove   Balance Overall balance assessment: Needs assistance     Sitting balance - Comments: supported in recliner with L lean preference                                   ADL either performed or assessed with clinical judgement   ADL Overall ADL's : Needs assistance/impaired     Grooming: Moderate assistance;Wash/dry face Grooming Details (indicate cue type and reason): sitting in recliner, requires support under elbow to reach forehead.  Drops wash cloth several times             Lower Body Dressing: Total assistance;+2 for physical assistance;+2 for safety/equipment;Bed level   Toilet Transfer: Total assistance;+2 for physical assistance;+2 for safety/equipment Toilet Transfer Details (indicate cue type and reason): using maximove         Functional mobility during ADLs: Total assistance;+2 for physical assistance;+2 for safety/equipment  Extremity/Trunk Assessment              Vision   Vision Assessment?: Yes Eye Alignment: Impaired (comment) Ocular Range of Motion: Impaired-to be further tested in functional context Alignment/Gaze Preference:  Head tilt;Gaze left;Chin down Tracking/Visual Pursuits: Impaired - to be further tested in functional context Diplopia Assessment: Only with right gaze;Only with left gaze Depth Perception: Undershoots Additional Comments: patient reports TV looks normal, but when looking L and R he sees 2 fingers (when shown 1).  Mild dysconjugate gaze, L gaze preference and max cueing required for pt to scan to R side.  Much more in depth visual assessment needed but pt reports parital occlusion glasses are helping.   Perception     Praxis      Cognition Arousal/Alertness: Awake/alert Behavior During Therapy: Flat affect Overall Cognitive Status: No family/caregiver present to determine baseline cognitive functioning Area of Impairment: Problem solving, Following commands, Memory, Attention, Safety/judgement, Awareness                   Current Attention Level: Sustained, Focused Memory: Decreased short-term memory, Decreased recall of precautions Following Commands: Follows one step commands consistently, Follows one step commands with increased time, Follows multi-step commands inconsistently Safety/Judgement: Decreased awareness of safety, Decreased awareness of deficits Awareness: Emergent Problem Solving: Slow processing, Decreased initiation, Difficulty sequencing, Requires verbal cues, Requires tactile cues General Comments: pt with flat affect today.  Slow processing and requires increased time to engage/respond to thearpists. Difficulty attending to tasks.        Exercises      Shoulder Instructions       General Comments resting hand splint delivered in room.  Educated Therapist, sports and pt on wearing schedule to encourage functional use of UE during the day.    Pertinent Vitals/ Pain       Pain Assessment Pain Assessment: Faces Faces Pain Scale: Hurts whole lot Pain Location: back, LEs Pain Descriptors / Indicators: Grimacing, Guarding, Burning Pain Intervention(s): Limited activity  within patient's tolerance, Monitored during session, Repositioned  Home Living                                          Prior Functioning/Environment              Frequency  Min 3X/week        Progress Toward Goals  OT Goals(current goals can now be found in the care plan section)  Progress towards OT goals: Progressing toward goals  Acute Rehab OT Goals Patient Stated Goal: get better OT Goal Formulation: With patient Time For Goal Achievement: 08/13/22 Potential to Achieve Goals: Good  Plan Discharge plan remains appropriate;Frequency remains appropriate    Co-evaluation    PT/OT/SLP Co-Evaluation/Treatment: Yes Reason for Co-Treatment: Complexity of the patient's impairments (multi-system involvement)   OT goals addressed during session: ADL's and self-care      AM-PAC OT "6 Clicks" Daily Activity     Outcome Measure   Help from another person eating meals?: A Lot Help from another person taking care of personal grooming?: A Lot Help from another person toileting, which includes using toliet, bedpan, or urinal?: Total Help from another person bathing (including washing, rinsing, drying)?: A Lot Help from another person to put on and taking off regular upper body clothing?: A Lot Help from another person to put on and taking off regular lower body clothing?:  Total 6 Click Score: 10    End of Session    OT Visit Diagnosis: Other abnormalities of gait and mobility (R26.89);Muscle weakness (generalized) (M62.81);History of falling (Z91.81);Other symptoms and signs involving the nervous system (R29.898);Pain Pain - part of body:  (back, legs)   Activity Tolerance Patient tolerated treatment well   Patient Left in chair;with call bell/phone within reach;with chair alarm set   Nurse Communication Mobility status;Precautions;Other (comment) (vision and glasses, resting hand splint to L UE ans chedule)        Time: [id number] OT Time  Calculation (min): 42 min  Charges: OT General Charges $OT Visit: 1 Visit OT Treatments $Self Care/Home Management : 8-22 mins $Therapeutic Activity: 8-22 mins  Jolaine Artist, OT Acute Rehabilitation Services Office [phone]   Delight Stare 08/03/2022, 11:59 AM

## 2022-08-03 NOTE — Progress Notes (Signed)
Orthopedic Tech Progress Note Patient Details:  Bradley Brandt 05/30/1981 [id number] Called in order to [place] [facility] for L resting hand splint Patient ID: Bradley Brandt, male   DOB: 04/20/1981, 42 y.o.   MRN: [id number]  [name] 08/03/2022, 9:59 AM

## 2022-08-03 NOTE — Procedures (Signed)
Indication:  Risks of the procedure were dicussed with the patient including post-LP headache, bleeding, infection, weakness/numbness of legs(radiculopathy), death.  The patient assented, could not reach family for consent (given AMS); due to importance of procedure two-physician consent obtained with Dr. Leonel Ramsay and myself  The patient was prepped and draped, and using sterile technique a 20 gauge quinke spinal needle was inserted in the L2/L3 without CSF return and then separate successful attempt at the L2/L1 space. The opening pressure was 17 with knees flexed. Approximately 20 cc of CSF were obtained and sent for analysis. Patient tolerated the procedure well and there were no immediate complications   Beulah Gandy, NP, assisted with the procedure and I was present and supervising for the entirety of the procedure.   Lesleigh Noe MD-PhD [facility] [phone]

## 2022-08-04 DIAGNOSIS — R509 Fever, unspecified: Secondary | ICD-10-CM

## 2022-08-04 DIAGNOSIS — E46 Unspecified protein-calorie malnutrition: Secondary | ICD-10-CM

## 2022-08-04 DIAGNOSIS — G35 Multiple sclerosis: Secondary | ICD-10-CM | POA: Diagnosis not present

## 2022-08-04 DIAGNOSIS — M6282 Rhabdomyolysis: Secondary | ICD-10-CM | POA: Diagnosis not present

## 2022-08-04 LAB — COMPREHENSIVE METABOLIC PANEL
ALT: 43 U/L (ref 0–44)
AST: 33 U/L (ref 15–41)
Albumin: 2.2 g/dL — ABNORMAL LOW (ref 3.5–5.0)
Alkaline Phosphatase: 47 U/L (ref 38–126)
Anion gap: 9 (ref 5–15)
BUN: 16 mg/dL (ref 6–20)
CO2: 28 mmol/L (ref 22–32)
Calcium: 8.7 mg/dL — ABNORMAL LOW (ref 8.9–10.3)
Chloride: 100 mmol/L (ref 98–111)
Creatinine, Ser: 0.85 mg/dL (ref 0.61–1.24)
GFR, Estimated: >60 mL/min (ref >60)
Glucose, Bld: 122 mg/dL — ABNORMAL HIGH (ref 70–99)
Potassium: 4.2 mmol/L (ref 3.5–5.1)
Sodium: 137 mmol/L (ref 135–145)
Total Bilirubin: 0.1 mg/dL — ABNORMAL LOW (ref 0.3–1.2)
Total Protein: 5.6 g/dL — ABNORMAL LOW (ref 6.5–8.1)

## 2022-08-04 MED ORDER — IMMUNE GLOBULIN (HUMAN) 10 GM/100ML IV SOLN
400.0000 mg/kg | INTRAVENOUS | Status: AC
  Administered 2022-08-04 – 2022-08-08 (×5): 30 g via INTRAVENOUS
  Filled 2022-08-04 (×7): qty 300

## 2022-08-04 MED ORDER — LACTATED RINGERS IV SOLN: INTRAVENOUS | Status: AC

## 2022-08-04 MED ORDER — ACETAMINOPHEN 325 MG PO TABS
650.0000 mg | ORAL_TABLET | Freq: Four times a day (QID) | ORAL | Status: DC | PRN
  Administered 2022-08-04 – 2022-08-07 (×2): 650 mg via ORAL
  Filled 2022-08-04 (×3): qty 2

## 2022-08-04 MED ORDER — SENNOSIDES-DOCUSATE SODIUM 8.6-50 MG PO TABS
2.0000 | ORAL_TABLET | Freq: Two times a day (BID) | ORAL | Status: DC
  Administered 2022-08-04 – 2022-08-10 (×7): 2 via ORAL
  Filled 2022-08-04 (×10): qty 2

## 2022-08-04 MED ORDER — DIPHENHYDRAMINE HCL 50 MG/ML IJ SOLN
50.0000 mg | INTRAMUSCULAR | Status: AC
  Administered 2022-08-04: 50 mg via INTRAVENOUS
  Filled 2022-08-04: qty 1

## 2022-08-04 MED ORDER — GABAPENTIN 400 MG PO CAPS
400.0000 mg | ORAL_CAPSULE | Freq: Three times a day (TID) | ORAL | Status: DC
  Administered 2022-08-05 – 2022-08-10 (×17): 400 mg via ORAL
  Filled 2022-08-04 (×17): qty 1

## 2022-08-04 NOTE — Progress Notes (Signed)
Increased IVIG rate, pt began shivering, BP was 151/102, HR 105. Notified neurologist, provider ordered benadryl and wanted to restart IVIG at 2m/hr and to keep it at that rate until medication was complete.

## 2022-08-04 NOTE — Progress Notes (Addendum)
Subjective:   Patient's weakness and aphasia remain unchanged today.  Patient shakes his head no when asked if he is sad, depressed, or anxious.  He appears frustrated with his condition.  He states that his pain is improved from yesterday.  Discussed that neurology will be starting IVIG today.  Discussed plan to restart IV fluids given that he appears dehydrated.  Patient would like [place] to update his mother on his condition today.  Objective:  Vital signs in last 24 hours: Vitals:   08/03/22 0853 08/03/22 1607 08/03/22 2042 08/04/22 0521  BP: (!) 154/73 133/66 130/86 126/77  Pulse: 78 74 97 (!) 116  Resp: 16 19 17 19  $ Temp: 98.3 F (36.8 C) 97.7 F (36.5 C) 98.5 F (36.9 C) 99.5 F (37.5 C)  TempSrc: Oral Oral Oral Oral  SpO2: 91% 100% 95% 95%  Weight:   82.4 kg   Height:       Physical Exam: General: appears uncomfortable, speaking in short sentences, slow to respond HENT: single, mobile swelling at the left posterior cervical region consistent w/ epidermal inclusion cyst, no lymphadenopathy, dry mucous membranes CV: Regular rate, rhythm. No murmurs, rubs, or gallops. No LE swelling.  Pulm: Normal work of breathing on room air. No wheezing or crackles. Abdomen: Soft, non-tender, non-distended. Normoactive bowel sounds. MSK: increased muscle tone of upper and lower extremities worst in bilateral LE, pain w/ ROM of the RLE Neuro: Alert and oriented to person, place, but not time. Cranial nerves intact. 1/5 grip strength of the LUE. 3/5 grip strength of the RUE. 1/5 strength of bilateral LE.  Skin: Warm, dry. No rash.  Assessment/Plan:  Principal Problem:   Multiple sclerosis flare Active Problems:   Relapsing remitting multiple sclerosis (HCC)   Rhabdomyolysis   Acute urinary retention   Fever  Bradley Brandt is 42yo male with relapsing/remitting multiple sclerosis, bipolar 1 disorder, generalized anxiety disorder, and insomnia admitted for rhabdomyolysis and generalized  deconditioning likely secondary to progression of MS.    #Multiple sclerosis Diffuse weakness, expressive aphasia, and muscle spasms still present. Treating urinary retention w/ coude catheter. Symptoms today remain unchanged. Pain today is somewhat improved. Neurology planning to start IVIG today. Attempted to call mother to update x3 today without answer. Voicemail was left. Will attempt to call again tomorrow.   - F/u neurology recommendations - Continue baclofen to 20 mg every 6 hours - Continue gabapentin 800 mg twice daily today (will space out to 400 mg TID tomorrow) - Oxycodone 5 mg every 4 hours as needed for severe pain - Switch tylenol to q6 hours PRN to monitor fever curve - Maintain coud catheter - PT/OT   #Fever Spiked fever x2 thus far. No fever overnight however was on scheduled tylenol. No leukocytosis. Negative infectious workup thus far. No empiric antibiotic therapy at this time given no source of infection or hemodynamic instability. Neuro performed LP yesterday. Opening pressure was WNL. CSF cell count w/ a few rare monocytes/neutrophils, RBC, and WBC. CSF glucose elevated, protein normal. Meningitis/encephalitis panel negative. No organisms on CSF culture. Potentially drug associated fever which can be caused w/ gabapentin, oxycodone, Quetiapine, and abrupt stopping of baclofen. Tylenol was switched to PRN today to better assess for fever. - Continue to trend fever curve/vital signs closely - F/u CSF fungal culture, JC virus   #Rhabdomyolysis, improving #Malnutrition CK trended down following IV fluids. Cr still WNL. Decreased PO intake secondary to debility from MS. Albumin low on CMP today suggesting malnutrition. Patient is  still weak and likely not eating as much as he should. Appears dehydrated.  - Restart IV LR at 75 mL/hr   #Bipolar 1 disorder #Generalized anxiety disorder Continue home medications. - Duloxetine 30 mg daily - Quetiapine 200 mg nightly      Diet: regular Bowel: senna VTE: Lovenox IVF: IV LR 75 mL/hr Code: Full PT/OT recs: SNF, LUE resting hand splint     Prior to Admission Living Arrangement: at home alone Anticipated Discharge Location: TBD Barriers to Discharge: continued management Dispo: Anticipated discharge in approximately more than 2 day(s).  Starlyn Skeans, MD 08/04/2022, 5:31 AM Pager: [phone] After 5pm on weekdays and 1pm on weekends: On Call pager [phone]

## 2022-08-04 NOTE — Progress Notes (Signed)
Physical Therapy Treatment Patient Details Name: Bradley Brandt MRN: [id number] DOB: July 14, 1980 Today's Date: 08/04/2022   History of Present Illness Pt is a 42 y.o. male presents to [facility] hospital on 2/11 with worsening generalized weakness.  Admitted for rhabdomyolysis. Noted pt with hx of MS and multiple recent falls, recently dc'd for [facility] 1/30 for MS flare. MRI 2/12 with "multifocal progressed and active demyelinating disease since MRI last month, most notably in L brainstem and R centrum semiovale". PMH includes relapsing remitting MS, bipolar disorder, GAD, insomnia.    PT Comments    Participated with PT/OT co-tx addressing bed mobility independence, requiring max assist +2 to roll towards right side, reaching and sustaining hold on rails for positioning of lift pad. Pt greatly limited by back pain with bed mobility, reports aggressive spasms, especially turning towards left despite maintaining a near neutral alignment. Performed seated balance and active assist trunk flexion, using RUE to pull self forward and reposition in chair with assist. Patient will continue to benefit from skilled physical therapy services to further improve independence with functional mobility.    Recommendations for follow up therapy are one component of a multi-disciplinary discharge planning process, led by the attending physician.  Recommendations may be updated based on patient status, additional functional criteria and insurance authorization.  Follow Up Recommendations  Skilled nursing-short term rehab (<3 hours/day) Can patient physically be transported by private vehicle: No   Assistance Recommended at Discharge Frequent or constant Supervision/Assistance  Patient can return home with the following Two people to help with walking and/or transfers;Two people to help with bathing/dressing/bathroom;Assistance with cooking/housework;Assistance with feeding;Direct supervision/assist for medications  management;Assist for transportation;Direct supervision/assist for financial management;Help with stairs or ramp for entrance   Equipment Recommendations  Hospital bed (Pending progress - specialty seating and power w/c.)    Recommendations for Other Services Rehab consult     Precautions / Restrictions Precautions Precautions: Fall Precaution Comments: severe left lean and posterior lean Restrictions Weight Bearing Restrictions: No     Mobility  Bed Mobility Overal bed mobility: Needs Assistance Bed Mobility: Rolling Rolling: +2 for safety/equipment, Max assist         General bed mobility comments: Pt facilitating by reachign with RUE, and holding with LUE to roll onto Rt side today +2 max assist. Required assist to flex knee. Cues to facilitate, painful back spasms reported (unable to turn to Left because of this.) Repositioned while placing maxi move pad    Transfers Overall transfer level: Needs assistance                 General transfer comment: used maximove to transfer to recliner with total assist +2 Transfer via Lift Equipment: Maximove  Ambulation/Gait                   Stairs             Wheelchair Mobility    Modified Rankin (Stroke Patients Only)       Balance Overall balance assessment: Needs assistance Sitting-balance support: Single extremity supported Sitting balance-Leahy Scale: Zero Sitting balance - Comments: Supported in recliner, leaning heavily towards right today. Focused on forward trunk flexion. Able to hold briefly unsupported, needs assist to faciliate forward flexion, hand over hand assist to grasp arm rest. Postural control: Right lateral lean  Cognition Arousal/Alertness: Awake/alert Behavior During Therapy: Flat affect Overall Cognitive Status: No family/caregiver present to determine baseline cognitive functioning Area of Impairment: Problem solving,  Following commands, Memory, Attention, Safety/judgement, Awareness                   Current Attention Level: Sustained Memory: Decreased short-term memory, Decreased recall of precautions Following Commands: Follows one step commands consistently, Follows one step commands with increased time, Follows multi-step commands inconsistently Safety/Judgement: Decreased awareness of safety, Decreased awareness of deficits Awareness: Emergent Problem Solving: Slow processing, Decreased initiation, Difficulty sequencing, Requires verbal cues, Requires tactile cues General Comments: More aleart and conversant towards end of session. occlusion glasses very helpful. Some delay with cues due to visual deficits as well, reaching for arm rest on chair having trouble fixating on single target without cues or initial assist.        Exercises      General Comments General comments (skin integrity, edema, etc.): Neck supported with half rolled towel (horse-shoe shape) to stabilize Rt lateral flexion and facilitate active left flexion and rotation of c-spine      Pertinent Vitals/Pain Pain Assessment Pain Assessment: Faces Faces Pain Scale: Hurts whole lot Pain Location: Back with movement. Pain Descriptors / Indicators: Grimacing, Guarding, Burning Pain Intervention(s): Monitored during session, Repositioned, Limited activity within patient's tolerance    Home Living                          Prior Function            PT Goals (current goals can now be found in the care plan section) Acute Rehab PT Goals Patient Stated Goal: Indicated he wants to work with PT, even if it's painful PT Goal Formulation: With patient Time For Goal Achievement: 08/13/22 Potential to Achieve Goals: Fair Progress towards PT goals: Progressing toward goals    Frequency    Min 3X/week      PT Plan Current plan remains appropriate    Co-evaluation PT/OT/SLP Co-Evaluation/Treatment:  Yes Reason for Co-Treatment: Complexity of the patient's impairments (multi-system involvement);To address functional/ADL transfers;For patient/therapist safety PT goals addressed during session: Mobility/safety with mobility;Strengthening/ROM        AM-PAC PT "6 Clicks" Mobility   Outcome Measure  Help needed turning from your back to your side while in a flat bed without using bedrails?: A Lot Help needed moving from lying on your back to sitting on the side of a flat bed without using bedrails?: Total Help needed moving to and from a bed to a chair (including a wheelchair)?: Total Help needed standing up from a chair using your arms (e.g., wheelchair or bedside chair)?: Total Help needed to walk in hospital room?: Total Help needed climbing 3-5 steps with a railing? : Total 6 Click Score: 7    End of Session Equipment Utilized During Treatment:  Harrel Lemon lift) Activity Tolerance: Patient tolerated treatment well Patient left: in chair;with call bell/phone within reach;with chair alarm set;with nursing/sitter in room Nurse Communication: Mobility status;Need for lift equipment PT Visit Diagnosis: Other abnormalities of gait and mobility (R26.89);Pain;Repeated falls (R29.6);Muscle weakness (generalized) (M62.81) Pain - Right/Left:  (bil) Pain - part of body:  (back)     Time: [id number] PT Time Calculation (min) (ACUTE ONLY): 38 min  Charges:  $Therapeutic Activity: 8-22 mins                     Candie Mile, PT, DPT Physical Therapist  [facility]    Ellouise Newer 08/04/2022, 1:59 PM

## 2022-08-04 NOTE — Progress Notes (Addendum)
Patient developed shivering with stable vitals during the infusion of IVIG.  This is not an uncommon side effect and is usually rate dependent so I advised Benadryl and to slow the rate.  I will premedicate with Benadryl for IVIG starting tomorrow.  Roland Rack, MD [facility] [phone]  If 7pm- 7am, please page neurology on call as listed in [place].

## 2022-08-04 NOTE — Progress Notes (Signed)
Subjective: Significantly worse  Exam: Vitals:   08/04/22 0521 08/04/22 0856  BP: 126/77 122/76  Pulse: (!) 116 91  Resp: 19 18  Temp: 99.5 F (37.5 C) 98.9 F (37.2 C)  SpO2: 95% 96%   Gen: In bed, NAD Resp: non-labored breathing, no acute distress Abd: soft, nt  Neuro: MS: Awake, alert, he has significant difficulty with expressive aphasia, few words today CN: He has a slightly disconjugate gaze with a right hypometria, right facial weakness. 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 His exam has continued to worsen despite tysabri last week and multiple rounds of steroids. At thsi point, he needs some form of salvage therapy. This is complicated by two factors, one is the fact that this is tysabri rebound, and the second is his fevers. IVIG has shown benefit in small trials, and has the benefit of not removing tysabri. Also if the infection is viral, there can even be some benefit. He does not have a hypothalamic lesion I would expect to cause central fevers, but could consider this as a possibility as well.   I would not expect PML to present in the setting of tysabri withdrawal, but I have sent CSF JC PCR as well.   Recommendations: 1) IVIG 2g/kg divided over 5 days, starting today.  2) Will continue to follow.   Roland Rack, MD [facility] [phone]  If 7pm- 7am, please page neurology on call as listed in [place].

## 2022-08-04 NOTE — Progress Notes (Signed)
Occupational Therapy Treatment Patient Details Name: Bradley Brandt MRN: [id number] DOB: [birth year] Today's Date: 08/04/2022   History of present illness Pt is a 42 y.o. male presents to [facility] hospital on 2/11 with worsening generalized weakness.  Admitted for rhabdomyolysis. Noted pt with hx of MS and multiple recent falls, recently dc'd for [facility] 1/30 for MS flare. MRI 2/12 with "multifocal progressed and active demyelinating disease since MRI last month, most notably in L brainstem and R centrum semiovale". PMH includes relapsing remitting MS, bipolar disorder, GAD, insomnia.   OT comments  Pt smiling at times and understanding humor. Focus of session on gentle PROM and AROM of neck, trunk control seated in chair. Pt rolled with +2 assist for placement of lift pad, pt with c/o significant back pain, pt assisting with use of B UEs on bed rail. Lifted with maximove and positioned in midline in chair, use of blanket to support head. Drank with set up with cup with straw, completed oral care with min assist in chair with built up toothbrush and R hand.   Recommendations for follow up therapy are one component of a multi-disciplinary discharge planning process, led by the attending physician.  Recommendations may be updated based on patient status, additional functional criteria and insurance authorization.    Follow Up Recommendations  Skilled nursing-short term rehab (<3 hours/day)     Assistance Recommended at Discharge Frequent or constant Supervision/Assistance  Patient can return home with the following  Two people to help with walking and/or transfers;Two people to help with bathing/dressing/bathroom;Assistance with cooking/housework;Direct supervision/assist for medications management;Assistance with feeding;Direct supervision/assist for financial management;Assist for transportation;Help with stairs or ramp for entrance   Equipment Recommendations  Other (comment) (defer to next venue)     Recommendations for Other Services      Precautions / Restrictions Precautions Precautions: Fall Precaution Comments: severe left lean and posterior lean Restrictions Weight Bearing Restrictions: No       Mobility Bed Mobility Overal bed mobility: Needs Assistance Bed Mobility: Rolling Rolling: +2 for safety/equipment, Max assist         General bed mobility comments: Pt facilitating by reachign with RUE, and holding with LUE to roll onto Rt side today +2 max assist. Required assist to flex knee. Cues to facilitate, painful back spasms reported (unable to turn to Left because of this.) Repositioned while placing maxi move pad    Transfers Overall transfer level: Needs assistance   Transfers: Bed to chair/wheelchair/BSC             General transfer comment: used maximove to transfer to recliner with total assist +2 Transfer via Lift Equipment: Maximove   Balance Overall balance assessment: Needs assistance Sitting-balance support: Single extremity supported Sitting balance-Leahy Scale: Zero Sitting balance - Comments: Supported in recliner, leaning heavily towards right today. Focused on forward trunk flexion. Able to hold briefly unsupported, needs assist to faciliate forward flexion, hand over hand assist to grasp arm rest.                                   ADL either performed or assessed with clinical judgement   ADL Overall ADL's : Needs assistance/impaired Eating/Feeding: Set up;Sitting Eating/Feeding Details (indicate cue type and reason): brings cup with lid to mouth to drink in sitting Grooming: Oral care;Sitting;Minimal assistance Grooming Details (indicate cue type and reason): brushed teeth with R hand and set up of toothbrush and holding cup/emesis  basin                                    Extremity/Trunk Assessment              Vision   Additional Comments: pt reports vision occlusion glasses are helpful, educated  in importance of working on head control/neck strength for vision   Perception     Praxis      Cognition Arousal/Alertness: Awake/alert Behavior During Therapy: WFL for tasks assessed/performed (smiling at times) Overall Cognitive Status: No family/caregiver present to determine baseline cognitive functioning Area of Impairment: Problem solving, Following commands, Memory, Attention, Safety/judgement, Awareness                   Current Attention Level: Sustained Memory: Decreased short-term memory, Decreased recall of precautions Following Commands: Follows one step commands consistently, Follows one step commands with increased time, Follows multi-step commands inconsistently Safety/Judgement: Decreased awareness of safety, Decreased awareness of deficits Awareness: Emergent Problem Solving: Slow processing, Decreased initiation, Difficulty sequencing, Requires verbal cues, Requires tactile cues General Comments: needing multimodal cues at times to follow commands with motor output        Exercises      Shoulder Instructions       General Comments gentle stretching and ROM to neck, pt able to move head into midline after stretching and once seated in chair    Pertinent Vitals/ Pain       Pain Assessment Pain Assessment: Faces Faces Pain Scale: Hurts whole lot Pain Location: Back with movement. Pain Descriptors / Indicators: Grimacing, Guarding, Burning Pain Intervention(s): Monitored during session, Repositioned, Premedicated before session  Home Living                                          Prior Functioning/Environment              Frequency  Min 3X/week        Progress Toward Goals  OT Goals(current goals can now be found in the care plan section)  Progress towards OT goals: Progressing toward goals  Acute Rehab OT Goals OT Goal Formulation: With patient Time For Goal Achievement: 08/13/22 Potential to Achieve Goals: Good   Plan Discharge plan remains appropriate;Frequency remains appropriate    Co-evaluation    PT/OT/SLP Co-Evaluation/Treatment: Yes Reason for Co-Treatment: Complexity of the patient's impairments (multi-system involvement);To address functional/ADL transfers;For patient/therapist safety PT goals addressed during session: Mobility/safety with mobility;Strengthening/ROM OT goals addressed during session: ADL's and self-care;Strengthening/ROM      AM-PAC OT "6 Clicks" Daily Activity     Outcome Measure   Help from another person eating meals?: A Little Help from another person taking care of personal grooming?: A Little Help from another person toileting, which includes using toliet, bedpan, or urinal?: Total Help from another person bathing (including washing, rinsing, drying)?: A Lot Help from another person to put on and taking off regular upper body clothing?: A Lot Help from another person to put on and taking off regular lower body clothing?: Total 6 Click Score: 12    End of Session    OT Visit Diagnosis: Muscle weakness (generalized) (M62.81);History of falling (Z91.81);Other symptoms and signs involving the nervous system (R29.898);Pain   Activity Tolerance Patient tolerated treatment well   Patient Left in chair;with call bell/phone  within reach;with chair alarm set   Nurse Communication Mobility status;Need for lift equipment        Time: [id number] OT Time Calculation (min): 42 min  Charges: OT General Charges $OT Visit: 1 Visit OT Treatments $Self Care/Home Management : 8-22 mins $Therapeutic Activity: 8-22 mins  Cleta Alberts, OTR/L Acute Rehabilitation Services Office: [phone]   Malka So 08/04/2022, 2:36 PM

## 2022-08-04 NOTE — Progress Notes (Signed)
   08/04/22 0521  Assess: MEWS Score  Temp 99.5 F (37.5 C)  BP 126/77  MAP (mmHg) 116  Pulse Rate (!) 116  Resp 19  Level of Consciousness Alert  SpO2 95 %  O2 Device Room Air  Assess: MEWS Score  MEWS Temp 0  MEWS Systolic 0  MEWS Pulse 2  MEWS RR 0  MEWS LOC 0  MEWS Score 2  MEWS Score Color Yellow  Assess: if the MEWS score is Yellow or Red  Were vital signs taken at a resting state? Yes  Focused Assessment Change from prior assessment (see assessment flowsheet)  Does the patient meet 2 or more of the SIRS criteria? No  Does the patient have a confirmed or suspected source of infection? No  MEWS guidelines implemented  Yes, yellow  Treat  MEWS Interventions Considered administering scheduled or prn medications/treatments as ordered  MEWS Interventions Escalated (See documentation below);Administered scheduled meds/treatments (Tylenol)  Take Vital Signs  Increase Vital Sign Frequency  Yellow: Q2hr x1, continue Q4hrs until patient remains green for 12hrs  Escalate  MEWS: Escalate Yellow: Discuss with charge nurse and consider notifying provider and/or RRT  Notify: Charge Nurse/RN  Name of Charge Nurse/RN Notified Charito, RN  Provider Notification  Provider Name/Title N/A  Notify: Rapid Response  Name of Rapid Response RN Notified N/A  Assess: SIRS CRITERIA  SIRS Temperature  0  SIRS Pulse 1  SIRS Respirations  0  SIRS WBC 0  SIRS Score Sum  1

## 2022-08-05 DIAGNOSIS — G35 Multiple sclerosis: Secondary | ICD-10-CM | POA: Diagnosis not present

## 2022-08-05 DIAGNOSIS — R509 Fever, unspecified: Secondary | ICD-10-CM | POA: Diagnosis not present

## 2022-08-05 LAB — GLUCOSE, CAPILLARY: Glucose-Capillary: 109 mg/dL — ABNORMAL HIGH (ref 70–99)

## 2022-08-05 MED ORDER — DIPHENHYDRAMINE HCL 50 MG/ML IJ SOLN
12.5000 mg | INTRAMUSCULAR | Status: AC
  Administered 2022-08-05 – 2022-08-08 (×4): 12.5 mg via INTRAVENOUS
  Filled 2022-08-05 (×4): qty 1

## 2022-08-05 NOTE — Significant Event (Signed)
Rapid Response Event Note   Reason for Call :  Decreased LOC  Pt received oxy 55m and baclofen at 2012, and 2039mseroquel and 80075meurotin at 2202.  Per RN, pt was alert and oriented at midnight. Initial Focused Assessment:  Pt lying in bed with eyes closed. He will open his eyes to deep sternal rub but will not move extremities or speak. Pupils 2, equal, and sluggish. Lungs clear t/o. Skin cool/clammy/diaphoretic.  T-99.4, HR-115, BP-108/64, RR-21, SpO2-96% on RA.   Qtip placed in pt's nose and pt able to reach up and grab it with R arm. After this, pt more awake, able to speak, and smile. Interventions:  CBG-109 Plan of Care:  Pt more awake now. He is protecting his airway. Continue to monitor pt. Call RRT if further assistance needed.   Event Summary:   MD Notified: Drs AtwRaymondo Bandd ZheMarkus Jarvis bedside Call TimYumad Time:0300  HopDillard EssexN

## 2022-08-05 NOTE — Progress Notes (Signed)
At approximately 0210, this RN went to patient's room to give scheduled baclofen and prn oxycodone.  Patient was last seen awake and interactive while doing VS at 0033.  At [place], patient is unresponsive except for slight fluttering of eye lids with deep sternal rub or pinching.  VS obtained - 108/64, HR-115, Resp-21, and O2-99% on RA.  Rectal Temp showed 99.4.  Upon turning patient, he did not move or moan or scream with pain.  Earlier in the night, he would scream with any turning to clean him.  Rapid Response, Internal Medicine Team, and Neurology at bedside.  Patient was given Seroquel and Gabapentin at 2202.  When Qtip placed in patient's nose, he reached up to grab it with his right arm.  He then spoke with staff and smiled at staff.  He did drift back off to sleep.  Will continue to monitor patient.  Earleen Reaper RN

## 2022-08-05 NOTE — Progress Notes (Signed)
Subjective: Very sleepy this AM  Exam: Vitals:   08/05/22 0245 08/05/22 0504  BP:  120/74  Pulse:  91  Resp:  17  Temp: 99.4 F (37.4 C) 98.6 F (37 C)  SpO2:  96%   Gen: In bed, NAD Resp: non-labored breathing, no acute distress Abd: soft, nt  Neuro: MS: Somnolent but easily arousable, he has significant difficulty with expressive aphasia, few words today, but he does reliably follow commands,  CN: He has a slightly disconjugate gaze with a right hypometria, right facial weakness. Motor:  Does not give good effort today, continues to have better strength in RUE than other ext, will repeat strength testing tomorrow when more awake.    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 His exam has continued to worsen despite tysabri last week and multiple rounds of steroids. At thsi point, he needs some form of salvage therapy. This is complicated by two factors, one is the fact that this is tysabri rebound, and the second is his fevers. IVIG has shown benefit in small trials, and has the benefit of not removing tysabri. Also if the infection is viral, there can even be some benefit. He does not have a hypothalamic lesion I would expect to cause central fevers, but could consider this as a possibility as well.   I would not expect PML to present in the setting of tysabri withdrawal, but I have sent CSF JC PCR as well.   His shivering is not uncommon with IVIG and is usually rate dependant. Given sedation last night, will premedicate with much lower dose of benadryl today.   Recommendations: 1) IVIG 2g/kg divided over 5 days, started 2/16 today is day 2/5 2) benadryl 12.5 mg prior to IVIG 3) Will follow.   Roland Rack, MD [facility] [phone]  If 7pm- 7am, please page neurology on call as listed in [place].

## 2022-08-05 NOTE — Progress Notes (Addendum)
Subjective:  Subjectively feels improved.  He is right arm feels stronger to him.  He does report that he feels like he could be more.  Still having some muscle spasms.  No abdominal pain.  Objective:  Vital signs in last 24 hours: Vitals:   08/05/22 1302 08/05/22 1320 08/05/22 1338 08/05/22 1359  BP: 123/73 133/80 131/80 131/71  Pulse: (!) 110 (!) 105 (!) 105 (!) 110  Resp: 18 19 18 18  $ Temp: 98.6 F (37 C) 98.8 F (37.1 C) 98.7 F (37.1 C) 98.6 F (37 C)  TempSrc: Oral Oral Oral Oral  SpO2: 98% 98% 97% 98%  Weight:      Height:       Physical Exam: General: appears comfortable, speaking in short sentences, slow to respond HENT: single, mobile swelling at the left posterior cervical region consistent w/ epidermal inclusion cyst, no lymphadenopathy, dry mucous membranes CV: Regular rate, rhythm. No murmurs, rubs, or gallops. No LE swelling.  Pulm: Normal work of breathing on room air. No wheezing or crackles. Abdomen: Soft, non-tender, non-distended. Normoactive bowel sounds. MSK: increased muscle tone of upper and lower extremities worst in bilateral LE, pain w/ ROM of the RLE Neuro: Alert and oriented, Cranial nerves intact.  Speech is more fluent today 5/5 grip strength of the RUE. Skin: Warm, dry. No rash.  Assessment/Plan:  Principal Problem:   Multiple sclerosis flare Active Problems:   Relapsing remitting multiple sclerosis (HCC)   Rhabdomyolysis   Acute urinary retention   Fever  Bradley Brandt is 42yo male with relapsing/remitting multiple sclerosis, bipolar 1 disorder, generalized anxiety disorder, and insomnia admitted for rhabdomyolysis and generalized deconditioning likely secondary to progression of MS.    #Multiple sclerosis Diffuse weakness, expressive aphasia, and muscle spasms still present. Treating urinary retention w/ coude catheter.  Patient received IVIG with neurology yesterday with some improvement in symptoms today.  He was overly sedated after  administration of Benadryl for premedication.  He will receive an additional infusion of IVIG today and lower dose of Benadryl.  Still having pain and muscle spasms so we will continue baclofen, gabapentin, oxycodone and current regimen otherwise. Has had similar MS flares with return to somewhat normal baseline level of functioning, previously lived on his own.  He will undoubtedly need SNF on discharge.  We are hoping that he is able to return to a level of independence with IV Ig, but will consider palliative consult if he does not improve as we hope.  -IVIG per neurology with Benadryl for premedication - Continue baclofen to 20 mg every 6 hours - Continue gabapentin 800 mg twice daily today (will space out to 400 mg TID tomorrow) - Oxycodone 5 mg every 4 hours as needed for severe pain - Switch tylenol to q6 hours PRN to monitor fever curve - Maintain coud catheter - PT/OT   #Fever Patient has continued to have low-grade temperature with most recent being 100.9 at 745 last evening.  Workup thus far has been extensive and unrevealing.  Potentially drug associated fever which can be caused w/ gabapentin, oxycodone, Quetiapine, and abrupt stopping of baclofen.  Tylenol is as needed - Continue to trend fever curve/vital signs closely - F/u JC virus    # Protein calorie malnutrition Patient's MS is significant barrier to adequate nutrition.  He is not able to feed himself and is currently relying on staff to assist.  No difficulty swallowing and no contraindication to enteral intake at this time.  Will continue with oral  nutrition.  Supplementing with Ensure shakes and IV fluids to prevent dehydration.  Per staff patient had large bowel movement last evening. - IV LR at 75 mL/hr -Ensure  #Rhabdomyolysis Resolved   #Bipolar 1 disorder #Generalized anxiety disorder Continue home medications. - Duloxetine 30 mg daily - Quetiapine 200 mg nightly     Diet: regular Bowel: senna VTE:  Lovenox IVF: IV LR 75 mL/hr Code: Full PT/OT recs: SNF, LUE resting hand splint     Prior to Admission Living Arrangement: at home alone Anticipated Discharge Location: TBD Barriers to Discharge: continued management Dispo: Anticipated discharge in approximately more than 2 day(s).  Delene Ruffini, MD 08/05/2022, 2:24 PM Pager: [phone] After 5pm on weekdays and 1pm on weekends: On Call pager [phone]

## 2022-08-05 NOTE — Progress Notes (Signed)
Mobility Specialist Progress Note   08/05/22 1000  Mobility  Activity Turned to left side;Moved into chair position in bed (UE and neck stretching/ROM)  Level of Assistance Dependent, patient does less than 25%  Range of Motion/Exercises Active;Passive  Activity Response Tolerated fair   Patient received in supine, NT requesting assistance for repositioning. Required dependent level to total assist for repositioning. Patient complained of back and neck pain and requested to lay flat. Presented with spasticity associate with MS, noted patient with increased muscle tone and stiffness favoring the right side of his neck. Performed various stretches and massages to help alleviate pain and stiffness. Tolerated well without incident but was limited by pain and fatigue. Was left in supine with HOB at 0 degrees per patient request, all needs met and call bell in reach.   [place] Johnryan Sao, BS EXP Mobility Specialist Please contact via SecureChat or Rehab office at [phone]

## 2022-08-05 NOTE — Progress Notes (Signed)
   08/04/22 2003  Provider Notification  Provider Name/Title Dr. Markus Jarvis  Date Provider Notified 08/04/22  Time Provider Notified 2003  Method of Notification Page  Notification Reason Change in status (Temp)  Provider response See new orders  Date of Provider Response 08/04/22  Time of Provider Response 2004   At completion of IVIG infusion, VS were checked.  Patient is A & O x 3.  He is complaining of severe back/neck pain - 10/10.  Temperature is 100.9, 133/75, HR - 114, Resp Rate - 19, 95% RA.  He is diaphoretic.  Dr. Markus Jarvis made aware.  Tylenol, oxycodone, and baclofen given.  VS rechecked at 2034.  Temperature is down to 99.8.  HR is 121.  Patient is in [place] but has been for both items.  Will continue to monitor patient.  Earleen Reaper RN

## 2022-08-06 DIAGNOSIS — R509 Fever, unspecified: Secondary | ICD-10-CM | POA: Diagnosis not present

## 2022-08-06 DIAGNOSIS — G35 Multiple sclerosis: Secondary | ICD-10-CM | POA: Diagnosis not present

## 2022-08-06 LAB — COMPREHENSIVE METABOLIC PANEL
ALT: 46 U/L — ABNORMAL HIGH (ref 0–44)
AST: 37 U/L (ref 15–41)
Albumin: 2 g/dL — ABNORMAL LOW (ref 3.5–5.0)
Alkaline Phosphatase: 47 U/L (ref 38–126)
Anion gap: 9 (ref 5–15)
BUN: 20 mg/dL (ref 6–20)
CO2: 26 mmol/L (ref 22–32)
Calcium: 8.6 mg/dL — ABNORMAL LOW (ref 8.9–10.3)
Chloride: 101 mmol/L (ref 98–111)
Creatinine, Ser: 0.88 mg/dL (ref 0.61–1.24)
GFR, Estimated: >60 mL/min (ref >60)
Glucose, Bld: 109 mg/dL — ABNORMAL HIGH (ref 70–99)
Potassium: 4.4 mmol/L (ref 3.5–5.1)
Sodium: 136 mmol/L (ref 135–145)
Total Bilirubin: 0.2 mg/dL — ABNORMAL LOW (ref 0.3–1.2)
Total Protein: 6.5 g/dL (ref 6.5–8.1)

## 2022-08-06 LAB — CULTURE, BLOOD (ROUTINE X 2)
Culture: NO GROWTH
Culture: NO GROWTH
Special Requests: ADEQUATE
Special Requests: ADEQUATE

## 2022-08-06 MED ORDER — KETOROLAC TROMETHAMINE 10 MG PO TABS
10.0000 mg | ORAL_TABLET | Freq: Once | ORAL | Status: AC
  Administered 2022-08-06: 10 mg via ORAL
  Filled 2022-08-06: qty 1

## 2022-08-06 NOTE — Progress Notes (Addendum)
Subjective:  Patient reports that he continues to improve each day.  Strength largely returned in right arm but he is still struggling with moving the left arm.  He is able to move his legs little bit better.  Muscle spasms and pain remain controlled.  He is eating full meals with assistance.  Objective:  Vital signs in last 24 hours: Vitals:   08/06/22 1040 08/06/22 1059 08/06/22 1119 08/06/22 1135  BP: 127/79 130/77 126/77 137/80  Pulse: 100 (!) 110 (!) 107 (!) 102  Resp: 18 20 18 18  $ Temp: 98.8 F (37.1 C) 98.6 F (37 C) 98.2 F (36.8 C) 98.3 F (36.8 C)  TempSrc: Oral Oral Oral Oral  SpO2: 96% 97% 97% 96%  Weight:      Height:       Physical Exam: General: appears comfortable, speaking in short sentences, slow to respond HENT: single, mobile swelling at the left posterior cervical region consistent w/ epidermal inclusion cyst, no lymphadenopathy, dry mucous membranes CV: Regular rate, rhythm. No murmurs, rubs, or gallops. No LE swelling.  Pulm: Normal work of breathing on room air. No wheezing or crackles. Abdomen: Soft, non-tender, non-distended. Normoactive bowel sounds. MSK: increased muscle tone of upper and lower extremities worst in bilateral LE, pain w/ ROM of the RLE Neuro: Alert and oriented, Cranial nerves intact.  Speech is more fluent today 5/5 grip strength of the RUE. Skin: Warm, dry. No rash.  Skin left hip sloughing with healthy pink tissue underneath from injury sustained prior to admission  Assessment/Plan:  Principal Problem:   Multiple sclerosis flare Active Problems:   Relapsing remitting multiple sclerosis (HCC)   Rhabdomyolysis   Acute urinary retention   Fever  Bradley Brandt is 42yo male with relapsing/remitting multiple sclerosis, bipolar 1 disorder, generalized anxiety disorder, and insomnia admitted for rhabdomyolysis and generalized deconditioning likely secondary to progression of MS.    #Multiple sclerosis Improving with IVIG.  Pain is  currently manageable for him.  Does not seem to have had any oversedation with Benadryl last night.  Still has coud cath in place but will reassess daily if patient is ready for voiding trial.  Patient was down for several hours prior to admission with injury noted.  Superficial skin has become soft with healthy pink tissue underneath.  Wound care has been consulted for this.  Will continue rolling patient is appropriate.  With patient's ongoing symptoms, will need to ensure that he does not develop pneumonia.  Will get him an incentive spirometer. Has had similar MS flares with return to somewhat normal baseline level of functioning, previously lived on his own.  He will undoubtedly need SNF on discharge.  We are hoping that he is able to return to a level of independence with IV Ig, but will consider palliative consult if he does not improve as we hope.  -IVIG per neurology with Benadryl for premedication - Continue baclofen to 20 mg every 6 hours - Continue gabapentin 400 3 times daily - Oxycodone 5 mg every 4 hours as needed for severe pain - Tylenol as needed - Maintain coud catheter, daily assessments for removal - PT/OT - Wound care consult - I-S   #Fever Patient has continued to have low-grade temperature with most recent being 100.9 2 days ago.  Workup thus far has been extensive and unrevealing.  Potentially drug associated fever which can be caused w/ gabapentin, oxycodone, Quetiapine, and abrupt stopping of baclofen.  Tylenol is as needed - Continue to trend fever  curve/vital signs closely - F/u JC virus    # Protein calorie malnutrition Patient's MS is significant barrier to adequate nutrition.  He is not able to feed himself and is currently relying on staff to assist.  No difficulty swallowing and no contraindication to enteral intake at this time.  Will continue with oral nutrition.  Supplementing with Ensure shakes and IV fluids to prevent dehydration.  His albumin does continue  to drift down, so we will consult RD for further assistance with maintaining protein calorie intake. - IV LR at 75 mL/hr -Ensure  Left hip wound Present from pressure related to fall prior to admission and noted on admission.  Skin has begun to slough.  Wound care has been consulted.  No other wounds noted at this time.  Patient will need to be rolled as appropriate.  #Rhabdomyolysis Resolved   #Bipolar 1 disorder #Generalized anxiety disorder Continue home medications. - Duloxetine 30 mg daily - Quetiapine 200 mg nightly     Diet: regular Bowel: senna VTE: Lovenox IVF: IV LR 75 mL/hr Code: Full PT/OT recs: SNF, LUE resting hand splint     Prior to Admission Living Arrangement: at home alone Anticipated Discharge Location: TBD Barriers to Discharge: continued management Dispo: Anticipated discharge in approximately more than 2 day(s).  Bradley Ruffini, MD 08/06/2022, 1:37 PM Pager: [phone] After 5pm on weekdays and 1pm on weekends: On Call pager [phone]

## 2022-08-06 NOTE — Progress Notes (Addendum)
Subjective: He feels that he can speak a little easier today. Also no longer has diplopia  Exam: Vitals:   08/06/22 1119 08/06/22 1135  BP: 126/77 137/80  Pulse: (!) 107 (!) 102  Resp: 18 18  Temp: 98.2 F (36.8 C) 98.3 F (36.8 C)  SpO2: 97% 96%   Gen: In bed, NAD Resp: non-labored breathing, no acute distress Abd: soft, nt  Neuro: MS: Somnolent but easily arousable, he is able to name simple objects, few words, but they are appropriate, Gives year as 2020, gets month correct CN: He has improvement in his dysconjugate gaze, right facial weakenss Motor:      Right  Left Shoulder Abduction  4/5  1/5 Elbow Flexion   4+/5  1/5 Elbow Extension  4/5  1/5 Forearm pronation  4/5  1/5 Wrist Flexion   4/5  0/5 Wrist Extension  4-/5  0/5 Grip    4/5  3/5 Interossei   4-/5  0/5  Hip Flexion   3/5  1/5 Thigh abduction  1/5  1/5 Thigh adduction  5/5  2/5 Knee Flexion   2/5  1/5 Knee Extension  3/5  1/5 Ankle Dorsiflexion  4/5  0/5 Ankle Plantarflexion  4-/5  1/5  Sensory: symmetric to LT       Pertinent Labs: CMP - unremarkable  Impression: 42 year old male with a history of MS and severe relapse in the setting of Tysabri rebound. His exam has continued to worsen despite tysabri last week and multiple rounds of steroids. At thsi point, he needs some form of salvage therapy. This is complicated by two factors, one is the fact that this is tysabri rebound, and the second is his fevers. IVIG has shown benefit in small trials, and has the benefit of not removing tysabri. Also if the infection is viral, there can even be some benefit. He does not have a hypothalamic lesion I would expect to cause central fevers, but could consider this as a possibility as well.   I would not expect PML to present in the setting of tysabri withdrawal, but I have sent CSF JC PCR as well.   Recommendations: 1) IVIG 2g/kg divided over 5 days, started 2/16 today is day 3/5 2) benadryl 12.5 mg prior to  IVIG 3) Will follow.   Roland Rack, MD [facility] [phone]  If 7pm- 7am, please page neurology on call as listed in [place].

## 2022-08-07 DIAGNOSIS — G35 Multiple sclerosis: Secondary | ICD-10-CM | POA: Diagnosis not present

## 2022-08-07 LAB — CSF CULTURE W GRAM STAIN: Culture: NO GROWTH

## 2022-08-07 LAB — CBC WITH DIFFERENTIAL/PLATELET
Abs Immature Granulocytes: 0 10*3/uL (ref 0.00–0.07)
Basophils Absolute: 0.1 10*3/uL (ref 0.0–0.1)
Basophils Relative: 1 %
Eosinophils Absolute: 0.2 10*3/uL (ref 0.0–0.5)
Eosinophils Relative: 2 %
HCT: 36.6 % — ABNORMAL LOW (ref 39.0–52.0)
Hemoglobin: 12.4 g/dL — ABNORMAL LOW (ref 13.0–17.0)
Lymphocytes Relative: 31 %
Lymphs Abs: 2.4 10*3/uL (ref 0.7–4.0)
MCH: 31.6 pg (ref 26.0–34.0)
MCHC: 33.9 g/dL (ref 30.0–36.0)
MCV: 93.4 fL (ref 80.0–100.0)
Monocytes Absolute: 0.6 10*3/uL (ref 0.1–1.0)
Monocytes Relative: 8 %
Neutro Abs: 4.5 10*3/uL (ref 1.7–7.7)
Neutrophils Relative %: 58 %
Platelets: 280 10*3/uL (ref 150–400)
RBC: 3.92 MIL/uL — ABNORMAL LOW (ref 4.22–5.81)
RDW: 13.5 % (ref 11.5–15.5)
WBC: 7.8 10*3/uL (ref 4.0–10.5)
nRBC: 1.5 % — ABNORMAL HIGH (ref 0.0–0.2)
nRBC: 3 /100 WBC — ABNORMAL HIGH

## 2022-08-07 LAB — TECHNOLOGIST SMEAR REVIEW

## 2022-08-07 LAB — COMPREHENSIVE METABOLIC PANEL
ALT: 48 U/L — ABNORMAL HIGH (ref 0–44)
AST: 36 U/L (ref 15–41)
Albumin: 2 g/dL — ABNORMAL LOW (ref 3.5–5.0)
Alkaline Phosphatase: 46 U/L (ref 38–126)
Anion gap: 7 (ref 5–15)
BUN: 31 mg/dL — ABNORMAL HIGH (ref 6–20)
CO2: 28 mmol/L (ref 22–32)
Calcium: 8.8 mg/dL — ABNORMAL LOW (ref 8.9–10.3)
Chloride: 103 mmol/L (ref 98–111)
Creatinine, Ser: 0.93 mg/dL (ref 0.61–1.24)
GFR, Estimated: >60 mL/min (ref >60)
Glucose, Bld: 101 mg/dL — ABNORMAL HIGH (ref 70–99)
Potassium: 4.2 mmol/L (ref 3.5–5.1)
Sodium: 138 mmol/L (ref 135–145)
Total Bilirubin: 0.5 mg/dL (ref 0.3–1.2)
Total Protein: 6.4 g/dL — ABNORMAL LOW (ref 6.5–8.1)

## 2022-08-07 LAB — URINALYSIS, COMPLETE (UACMP) WITH MICROSCOPIC: RBC / HPF: >50 RBC/hpf (ref 0–5)

## 2022-08-07 LAB — LACTATE DEHYDROGENASE: LDH: 143 U/L (ref 98–192)

## 2022-08-07 MED ORDER — ADULT MULTIVITAMIN W/MINERALS CH
1.0000 | ORAL_TABLET | Freq: Every day | ORAL | Status: DC
  Administered 2022-08-07 – 2022-08-10 (×4): 1 via ORAL
  Filled 2022-08-07 (×4): qty 1

## 2022-08-07 MED ORDER — ZINC SULFATE 220 (50 ZN) MG PO CAPS
220.0000 mg | ORAL_CAPSULE | Freq: Every day | ORAL | Status: DC
  Administered 2022-08-07 – 2022-08-10 (×4): 220 mg via ORAL
  Filled 2022-08-07 (×4): qty 1

## 2022-08-07 MED ORDER — LACTATED RINGERS IV SOLN: INTRAVENOUS | Status: DC

## 2022-08-07 MED ORDER — VITAMIN C 500 MG PO TABS
500.0000 mg | ORAL_TABLET | Freq: Every day | ORAL | Status: DC
  Administered 2022-08-07 – 2022-08-10 (×4): 500 mg via ORAL
  Filled 2022-08-07 (×4): qty 1

## 2022-08-07 MED ORDER — MUPIROCIN 2 % EX OINT
TOPICAL_OINTMENT | Freq: Two times a day (BID) | CUTANEOUS | Status: DC
  Filled 2022-08-07 (×3): qty 22

## 2022-08-07 MED FILL — Immune Globulin (Human) IV Soln 20 GM/200ML: INTRAVENOUS | Qty: 200 | Status: AC

## 2022-08-07 MED FILL — Immune Globulin (Human) IV Soln 10 GM/100ML: INTRAVENOUS | Qty: 100 | Status: AC

## 2022-08-07 NOTE — Progress Notes (Signed)
Physical Therapy Treatment Patient Details Name: Bradley Brandt MRN: [id number] DOB: February 11, 1981 Today's Date: 08/07/2022   History of Present Illness Pt is a 42 y.o. male presents to [facility] hospital on 2/11 with worsening generalized weakness.  Admitted for rhabdomyolysis. Noted pt with hx of MS and multiple recent falls, recently dc'd for [facility] 1/30 for MS flare. MRI 2/12 with "multifocal progressed and active demyelinating disease since MRI last month, most notably in L brainstem and R centrum semiovale". Recieving IVIG.  PMH includes relapsing remitting MS, bipolar disorder, GAD, insomnia.    PT Comments    Pt progressing towards physical therapy goals. Focus of session was core stability training sitting EOB, cervical stretching, and positioning in the recliner. Geomat ordered to decrease risk for pressure injuries while in the recliner but not yet delivered to room during session. Pt demonstrating increased active movement of bilateral UE's and LE's. Continues to require +2 assist for all aspects of mobility at this time. Will continue to follow and progress as able per POC.    Recommendations for follow up therapy are one component of a multi-disciplinary discharge planning process, led by the attending physician.  Recommendations may be updated based on patient status, additional functional criteria and insurance authorization.  Follow Up Recommendations  Skilled nursing-short term rehab (<3 hours/day) Can patient physically be transported by private vehicle: No   Assistance Recommended at Discharge Frequent or constant Supervision/Assistance  Patient can return home with the following Two people to help with walking and/or transfers;Two people to help with bathing/dressing/bathroom;Assistance with cooking/housework;Assistance with feeding;Direct supervision/assist for medications management;Assist for transportation;Direct supervision/assist for financial management;Help with stairs or ramp for  entrance   Equipment Recommendations  Hospital bed (Pending progress - specialty seating and power w/c.)    Recommendations for Other Services Rehab consult     Precautions / Restrictions Precautions Precautions: Fall Precaution Comments: severe left lean and posterior lean Restrictions Weight Bearing Restrictions: No     Mobility  Bed Mobility Overal bed mobility: Needs Assistance Bed Mobility: Rolling, Sidelying to Sit Rolling: Max assist, +2 for physical assistance, +2 for safety/equipment Sidelying to sit: Max assist, +2 for physical assistance, +2 for safety/equipment       General bed mobility comments: pt able to initate LB flexion and reaching for rail with R UE today to assist. Bed pad utilized for full roll and transition to EOB.    Transfers Overall transfer level: Needs assistance   Transfers: Bed to chair/wheelchair/BSC             General transfer comment: maximove pad placed at EOB with pt laterally leaning and propping on elbows to offweight contralateral hip, and pt lifted to recliner Transfer via Lift Equipment: Maximove  Ambulation/Gait                   Stairs             Wheelchair Mobility    Modified Rankin (Stroke Patients Only)       Balance Overall balance assessment: Needs assistance Sitting-balance support: Single extremity supported, Bilateral upper extremity supported, No upper extremity supported Sitting balance-Leahy Scale: Poor Sitting balance - Comments: total assist but with UE placement on bed decreased to max assist to EOB statically.  Heavy lateral lean.  Worked on upright posture of trunk, as well has head/neck. Lateral leans to elbows for placement of maximove pad. Postural control: Right lateral lean  Cognition Arousal/Alertness: Awake/alert Behavior During Therapy: WFL for tasks assessed/performed Overall Cognitive Status: No family/caregiver present to  determine baseline cognitive functioning Area of Impairment: Problem solving, Following commands, Memory, Attention, Safety/judgement, Awareness, Orientation                 Orientation Level: Disoriented to, Time Current Attention Level: Sustained Memory: Decreased short-term memory, Decreased recall of precautions Following Commands: Follows one step commands consistently, Follows one step commands with increased time Safety/Judgement: Decreased awareness of deficits, Decreased awareness of safety Awareness: Emergent Problem Solving: Slow processing, Decreased initiation, Difficulty sequencing, Requires verbal cues, Requires tactile cues General Comments: mulitmodal cueing at times for motor output, incontinent of bowel upon entry with no awareness.  reports 2020.        Exercises Other Exercises Other Exercises: gentle stretching to neck, focusing on R upper trap, R levator scapulae, R SCM.    General Comments General comments (skin integrity, edema, etc.): gentle stretching to neck, stiffness on R side; gentle stretch to L hand and encouraged use of resting hand  splint (RN notified)      Pertinent Vitals/Pain Pain Assessment Pain Assessment: Faces Faces Pain Scale: Hurts whole lot Pain Location: L UE Pain Descriptors / Indicators: Grimacing, Guarding, Burning, Moaning Pain Intervention(s): Limited activity within patient's tolerance, Monitored during session, Repositioned    Home Living Family/patient expects to be discharged to:: Private residence Living Arrangements: Alone Available Help at Discharge: Family;Available PRN/intermittently Type of Home: House Home Access: Ramped entrance       Home Layout: One level Home Equipment: Rollator (4 wheels);Cane - single point;Grab bars - tub/shower;Shower seat;Wheelchair - manual Additional Comments: Pt reports that he did not need to use his shower chair at baseline when bathing.    Prior Function            PT  Goals (current goals can now be found in the care plan section) Acute Rehab PT Goals PT Goal Formulation: With patient Time For Goal Achievement: 08/13/22 Potential to Achieve Goals: Fair Progress towards PT goals: Progressing toward goals    Frequency    Min 3X/week      PT Plan Current plan remains appropriate    Co-evaluation PT/OT/SLP Co-Evaluation/Treatment: Yes Reason for Co-Treatment: Complexity of the patient's impairments (multi-system involvement);To address functional/ADL transfers;For patient/therapist safety PT goals addressed during session: Mobility/safety with mobility;Strengthening/ROM OT goals addressed during session: ADL's and self-care;Strengthening/ROM      AM-PAC PT "6 Clicks" Mobility   Outcome Measure  Help needed turning from your back to your side while in a flat bed without using bedrails?: A Lot Help needed moving from lying on your back to sitting on the side of a flat bed without using bedrails?: Total Help needed moving to and from a bed to a chair (including a wheelchair)?: Total Help needed standing up from a chair using your arms (e.g., wheelchair or bedside chair)?: Total Help needed to walk in hospital room?: Total Help needed climbing 3-5 steps with a railing? : Total 6 Click Score: 7    End of Session   Activity Tolerance: Patient tolerated treatment well Patient left: in chair;with call bell/phone within reach;with chair alarm set;with nursing/sitter in room Nurse Communication: Mobility status;Need for lift equipment PT Visit Diagnosis: Other abnormalities of gait and mobility (R26.89);Pain;Repeated falls (R29.6);Muscle weakness (generalized) (M62.81) Pain - Right/Left:  (bil) Pain - part of body: Arm (back)     Time: [id number] PT Time Calculation (min) (ACUTE ONLY): 46 min  Charges:  $  Therapeutic Activity: 8-22 mins                     Rolinda Roan, PT, DPT Acute Rehabilitation Services Secure Chat Preferred Office:  [phone]    Thelma Comp 08/07/2022, 4:07 PM

## 2022-08-07 NOTE — Consult Note (Signed)
[facility] Nurse Consult Note: Reason for Consult:patient with fall at home after MS flare and being "down" for a prolonged period of time.   Wound type:pressure/trauma Pressure Injury POA: Yes Measurement: 8 cm x 4.3 cm x 0.2 cm sloughing epithelium reveals bright pink wound bed.  Wound [id number] pink, moist Drainage (amount, consistency, odor) minimal serosanguinous no odor Periwound:intact Dressing procedure/placement/frequency:cleanse left hip wound with NS and pat dry. Apply mupirocin to wound bed.  Cover with foam dressing.  Turn and reposition every two hours.   Will not follow at this time.  Please re-consult if needed.  Estrellita Ludwig MSN, RN, FNP-BC CWON Wound, Ostomy, Continence Nurse [facility] [phone] Pager [phone]

## 2022-08-07 NOTE — Progress Notes (Signed)
Nutrition Follow-up  DOCUMENTATION CODES:   Not applicable  INTERVENTION:  - Continue Ensure Enlive po TID, each supplement provides 350 kcal and 20 grams of protein.  - Add MVI q day.   - Add Vit C q day.   - Add Zinc q day.   NUTRITION DIAGNOSIS:   Inadequate oral intake related to inability to eat, lethargy/confusion as evidenced by meal completion < 50%.  GOAL:   Patient will meet greater than or equal to 90% of their needs  MONITOR:   PO intake, Supplement acceptance  REASON FOR ASSESSMENT:   Malnutrition Screening Tool    ASSESSMENT:   42 y.o. male admits related to generalized weakness. PMH includes: relapsing MS, Bipolar 1 disorder, GAD, and insomnia. Pt is currently receiving medical management related to rhabdomyolysis.  Meds reviewed: Senokot, Vit D. Labs reviewed.   Pt is currently oriented x2 and very slow with responses. He reports that he has been eating well and drinking his Ensure shakes. Spoke with RN who reports that the pt ate 50% of his breakfast this am. RD will add vitamins. Meal intakes have varied since admission. Wts stable per record. RD will continue to monitor PO intakes.   Diet Order:   Diet Order             Diet regular Room service appropriate? Yes; Fluid consistency: Thin  Diet effective now                   EDUCATION NEEDS:   Not appropriate for education at this time  Skin:  Skin Assessment: Skin Integrity Issues: Skin Integrity Issues:: Other (Comment) Other: Pressure injury to left thigh (no staging noted)  Last BM:  08/06/22  Height:   Ht Readings from Last 1 Encounters:  07/30/22 5' 11"$  (1.803 m)    Weight:   Wt Readings from Last 1 Encounters:  08/03/22 82.4 kg    Ideal Body Weight:     BMI:  Body mass index is 25.34 kg/m.  Estimated Nutritional Needs:   Kcal:  2015-2420 kcals  Protein:  100-120 gm  Fluid:  >/= 2 L  Thalia Bloodgood, RD, LDN, CNSC.

## 2022-08-07 NOTE — Progress Notes (Signed)
Patient currently has Coude Catheter in place.  It was noted that patient's urine is dark cloudy cranberry red in color and has been throughout the shift.  Patient does not have any complaints of urinary discomfort.  The chronic pain in his neck and back has been rated as 3/10 tonight and is significantly improved over previous nights.  Dr. Markus Jarvis made aware.  Order received for UA.  This was collected and sent to Lab.  Will continue to monitor patient.  Earleen Reaper RN

## 2022-08-07 NOTE — Progress Notes (Cosign Needed)
Subjective:   Patient is feeling much improved today.  Reports having increased ability to move his right hand.  He feels less weak overall.  He feels as though his speech has improved.  He feels happy today and is smiling due to his improvement. Patient reports having some pain in his penis that he thinks may be associated with irritation from his catheter.  Discussed plan to remove his catheter today and see if he can urinate without it.  Discussed plan to continue with IVIG until tomorrow.  Objective:  Vital signs in last 24 hours: Vitals:   08/06/22 1135 08/06/22 1348 08/06/22 2118 08/07/22 0511  BP: 137/80 (!) 140/78 131/82 111/73  Pulse: (!) 102 (!) 110 (!) 102 83  Resp: 18 20 17 18  $ Temp: 98.3 F (36.8 C) 98.9 F (37.2 C) 98.2 F (36.8 C) 98.4 F (36.9 C)  TempSrc: Oral Oral    SpO2: 96% 95% 96% 97%  Weight:      Height:       Physical Exam: General: appears comfortable, speaking in full sentences, smiling HENT: single, mobile swelling at the left posterior cervical region consistent w/ epidermal inclusion cyst, no lymphadenopathy, dry mucous membranes CV: Regular rate, rhythm. No murmurs, rubs, or gallops. No LE swelling.  Pulm: Normal work of breathing on room air. No wheezing or crackles. Abdomen: Soft, non-tender, non-distended. Normoactive bowel sounds. MSK: increased tone of bilateral UE and LE Neuro: Alert and oriented, Cranial nerves intact. Unable to hold his head upright, tends to flex toward R.  5/5 grip strength of the RUE. 3/5 grip strength of the LUE. 4/5 strength of bilateral LE.  Skin: Warm, dry. No rash.  Skin left hip sloughing with healthy pink tissue underneath.  Assessment/Plan:  Principal Problem:   Multiple sclerosis flare Active Problems:   Relapsing remitting multiple sclerosis (HCC)   Rhabdomyolysis   Acute urinary retention   Fever  Bradley Brandt is 42yo male with relapsing/remitting multiple sclerosis, bipolar 1 disorder, generalized  anxiety disorder, and insomnia admitted for rhabdomyolysis and generalized deconditioning likely secondary to progression of MS.    #Multiple sclerosis Currently on IVIG w/ benadryl pre-treatment. Weakness today is much improved. Pain today has also improved. Coude catheter still in place for urinary retention. Will need SNF at discharge. Will consider palliative consult if no improvement with IVIG treatment.   -IVIG per neurology with Benadryl for premedication - Continue baclofen to 20 mg every 6 hours - Continue gabapentin 400 3 times daily - Oxycodone 5 mg every 4 hours as needed for severe pain - Tylenol as needed - PT/OT - Wound care consult (done) - Incentive spirometer   #Fever Intermittent throughout his hospitalization. Infectious workup negative thus far. No fever overnight. Potentially drug associated.  - Continue to trend fever curve/vital signs closely - Continue tylenol PRN - F/u JC virus  #Hematuria "Cranberry colored" urine noted by nursing overnight. UA demonstrates red/turbid urine w/ rare bacteria and color interference w/ rest of the categories. LDH and blood smear WNL. CBC demonstrates mild normocytic anemia. Low suspicion for hemolytic anemia at this time. May be related to irritation from coude catheter. Will pull catheter and perform voiding trial today.  -IV fluids -Trend CBC   # Protein calorie malnutrition Secondary to debilitation from MS. Relying on staff for feeding. No difficulty swallowing.  -RD recommendations -IV LR at 75 mL/hr -Ensure shakes   #Left hip wound Had fall prior to admission. Left hip wound remains stable today. -Roll patient   #  Rhabdomyolysis - resolved   #Bipolar 1 disorder #Generalized anxiety disorder Continue home medications. - Duloxetine 30 mg daily - Quetiapine 200 mg nightly     Diet: regular Bowel: senna VTE: Lovenox IVF: IV LR 75 mL/hr Code: Full PT/OT recs: SNF, LUE resting hand splint     Prior to  Admission Living Arrangement: at home alone Anticipated Discharge Location: TBD Barriers to Discharge: continued management Dispo: Anticipated discharge in approximately more than 2 day(s).  Starlyn Skeans, MD 08/07/2022, 6:09 AM Pager: [phone] After 5pm on weekdays and 1pm on weekends: On Call pager [phone]

## 2022-08-07 NOTE — Progress Notes (Signed)
Occupational Therapy Treatment Patient Details Name: Bradley Brandt MRN: [id number] DOB: [birth year] Today's Date: 08/07/2022   History of present illness Pt is a 42 y.o. male presents to [facility] hospital on 2/11 with worsening generalized weakness.  Admitted for rhabdomyolysis. Noted pt with hx of MS and multiple recent falls, recently dc'd for [facility] 1/30 for MS flare. MRI 2/12 with "multifocal progressed and active demyelinating disease since MRI last month, most notably in L brainstem and R centrum semiovale". Recieving IVIG.  PMH includes relapsing remitting MS, bipolar disorder, GAD, insomnia.   OT comments  Patient progressing well towards OT goals.  Soiled with BM in bed, unaware; rolling with max assist +2 to complete hygiene and linen change.  Patient demonstrating significantly more functional use of R UE and BLEs to assist with bed mobility but continues to require max assist +2.  Upright at EOB, relies on external support to maintain static balance at max to total assist.  Worked on self feeding and grooming with setup assist using R UE, lateral leans at EOB and head/neck control.  Lifted to recliner using maximove for OOB.  Will follow acutely.    Recommendations for follow up therapy are one component of a multi-disciplinary discharge planning process, led by the attending physician.  Recommendations may be updated based on patient status, additional functional criteria and insurance authorization.    Follow Up Recommendations  Skilled nursing-short term rehab (<3 hours/day)     Assistance Recommended at Discharge Frequent or constant Supervision/Assistance  Patient can return home with the following  Two people to help with walking and/or transfers;Two people to help with bathing/dressing/bathroom;Assistance with cooking/housework;Direct supervision/assist for medications management;Assistance with feeding;Direct supervision/assist for financial management;Assist for transportation;Help  with stairs or ramp for entrance   Equipment Recommendations  Other (comment) (defer to next venue)    Recommendations for Other Services Speech consult    Precautions / Restrictions Precautions Precautions: Fall Precaution Comments: severe left lean and posterior lean Restrictions Weight Bearing Restrictions: No       Mobility Bed Mobility Overal bed mobility: Needs Assistance Bed Mobility: Rolling, Sidelying to Sit Rolling: Max assist, +2 for physical assistance, +2 for safety/equipment Sidelying to sit: Max assist, +2 for physical assistance, +2 for safety/equipment       General bed mobility comments: pt able to initate LB flexion and reaching for rail with R UE today to assist.    Transfers Overall transfer level: Needs assistance   Transfers: Bed to chair/wheelchair/BSC             General transfer comment: maximove pad placed at EOB and lifted to recliner Transfer via Lift Equipment: Maximove   Balance Overall balance assessment: Needs assistance Sitting-balance support: Single extremity supported, Bilateral upper extremity supported, No upper extremity supported Sitting balance-Leahy Scale: Poor Sitting balance - Comments: total assist but with UE placement on bed decreased to max assist to EOB statically.  Heavy lateral lean.  Worked on upright posture of trunk, as well has head/neck. Lateral leans to elbows for placement of maximove pad. Postural control: Right lateral lean                                 ADL either performed or assessed with clinical judgement   ADL Overall ADL's : Needs assistance/impaired Eating/Feeding: Set up;Sitting Eating/Feeding Details (indicate cue type and reason): brings cup to mouth using straw without assist Grooming: Wash/dry face;Set up;Bed level Grooming  Details (indicate cue type and reason): washing face with setup, assist to wash R side of neck                 Toilet Transfer: Total  assistance;+2 for physical assistance;+2 for safety/equipment Toilet Transfer Details (indicate cue type and reason): using maximove Toileting- Clothing Manipulation and Hygiene: Total assistance;+2 for physical assistance;+2 for safety/equipment;Bed level Toileting - Clothing Manipulation Details (indicate cue type and reason): incontinent of bowel     Functional mobility during ADLs: Total assistance;Maximal assistance;+2 for physical assistance;+2 for safety/equipment      Extremity/Trunk Assessment              Vision   Additional Comments: reports improving vision, worked on head/neck control and wearing partial occlusion glasses   Perception     Praxis      Cognition Arousal/Alertness: Awake/alert Behavior During Therapy: WFL for tasks assessed/performed Overall Cognitive Status: No family/caregiver present to determine baseline cognitive functioning Area of Impairment: Problem solving, Following commands, Memory, Attention, Safety/judgement, Awareness, Orientation                 Orientation Level: Disoriented to, Time Current Attention Level: Sustained Memory: Decreased short-term memory, Decreased recall of precautions Following Commands: Follows one step commands consistently, Follows one step commands with increased time Safety/Judgement: Decreased awareness of deficits, Decreased awareness of safety Awareness: Emergent Problem Solving: Slow processing, Decreased initiation, Difficulty sequencing, Requires verbal cues, Requires tactile cues General Comments: mulitmodal cueing at times for motor output, incontinent of bowel upon entry with no awareness.  reports 2020.        Exercises      Shoulder Instructions       General Comments gentle stretching to neck, stiffness on R side; gentle stretch to L hand and encouraged use of resting hand  splint (RN notified)    Pertinent Vitals/ Pain       Pain Assessment Pain Assessment: Faces Faces Pain  Scale: Hurts whole lot Pain Location: L UE Pain Descriptors / Indicators: Grimacing, Guarding, Burning Pain Intervention(s): Monitored during session, Repositioned, Patient requesting pain meds-RN notified, Ice applied, Limited activity within patient's tolerance  Home Living                                          Prior Functioning/Environment              Frequency  Min 3X/week        Progress Toward Goals  OT Goals(current goals can now be found in the care plan section)  Progress towards OT goals: Progressing toward goals  Acute Rehab OT Goals Patient Stated Goal: get better OT Goal Formulation: With patient Time For Goal Achievement: 08/13/22 Potential to Achieve Goals: Good  Plan Discharge plan remains appropriate;Frequency remains appropriate    Co-evaluation    PT/OT/SLP Co-Evaluation/Treatment: Yes Reason for Co-Treatment: Complexity of the patient's impairments (multi-system involvement);To address functional/ADL transfers;For patient/therapist safety   OT goals addressed during session: ADL's and self-care;Strengthening/ROM      AM-PAC OT "6 Clicks" Daily Activity     Outcome Measure   Help from another person eating meals?: A Little Help from another person taking care of personal grooming?: A Little Help from another person toileting, which includes using toliet, bedpan, or urinal?: Total Help from another person bathing (including washing, rinsing, drying)?: A Lot Help from another person to put on  and taking off regular upper body clothing?: A Lot Help from another person to put on and taking off regular lower body clothing?: Total 6 Click Score: 12    End of Session    OT Visit Diagnosis: Muscle weakness (generalized) (M62.81);History of falling (Z91.81);Other symptoms and signs involving the nervous system (R29.898);Pain Pain - part of body:  (L UE)   Activity Tolerance Patient tolerated treatment well   Patient Left in  chair;with call bell/phone within reach;with chair alarm set;with family/visitor present   Nurse Communication Mobility status;Need for lift equipment;Patient requests pain meds;Other (comment) (splint)        Time: [id number] OT Time Calculation (min): 49 min  Charges: OT General Charges $OT Visit: 1 Visit OT Treatments $Self Care/Home Management : 23-37 mins  [place] Office [phone]   Delight Stare 08/07/2022, 1:58 PM

## 2022-08-07 NOTE — Progress Notes (Addendum)
NEUROLOGY CONSULTATION PROGRESS NOTE   Date of service: August 07, 2022 Patient Name: Bradley Brandt MRN:  [id number] DOB:  Aug 15, 1980  Brief HPI  Bradley Brandt is a 42 y.o. male with PMH significant for  has a past medical history of Bipolar 1 disorder ([place]), Bipolar disorder, unspecified ([place]) (01/03/2013), MS (multiple sclerosis) ([place]) (dx 2006), and Neuromuscular disorder ([employer]). who presents with progressive weakness.   Interval Hx   Patient was seen this morning. He reports feeling better regarding his vision was less blurry. He feels his strength is improving as well. He denies having muscle spasms today and most recently had them yesterday.  Vitals   Vitals:   08/07/22 0511 08/07/22 0933 08/07/22 1120 08/07/22 1200  BP: 111/73 116/71 124/82 128/84  Pulse: 83 97  100  Resp: 18 17 19 19  $ Temp: 98.4 F (36.9 C)  98.7 F (37.1 C) 98.7 F (37.1 C)  TempSrc:      SpO2: 97% 98% 100%   Weight:      Height:         Body mass index is 25.34 kg/m.  Physical Exam   General: Laying comfortably in bed; in no acute distress.  HENT: Normal oropharynx and mucosa. Normal external appearance of ears and nose.  Neck: Supple, no pain or tenderness  CV: No JVD. No peripheral edema.  Pulmonary: Symmetric Chest rise. Normal respiratory effort.  Abdomen: Soft to touch, non-tender.  Ext: No cyanosis, edema, or deformity  Skin: No rash. Normal palpation of skin.   Musculoskeletal: Normal digits and nails by inspection. No clubbing.   Neurologic Examination  Mental status/Cognition: Alert, oriented to self, place, month and year, fair attention.  Speech/language: some dysarthria, comprehension intact, object naming intact, repetition intact, fair concentration Cranial nerves:  EOMI with saccadic pursuits.  Right head tilt.  Continues to have vertical diplopia not exacerbated in any particular direction of gaze.  No nystagmus.  Face symmetric, tongue midline, hearing intact to  speech. Motor: Muscle bulk: normal, tone normal tremor none  4/5 RUE and 2/5 LUE  3/5 RLE  and 3/5 LLE hip flexion bilaterally   Sensation: symmetric  Coordination/Complex Motor:  - Finger to Nose normal on right hand - Heel to shin unable to due to pain/weakness in the legs  Labs    Basic Metabolic Panel: Recent Labs  Lab 08/01/22 0652 08/02/22 1058 08/04/22 0209 08/06/22 0254 08/07/22 0337  NA 137 139 137 136 138  K 3.9 3.9 4.2 4.4 4.2  CL 103 104 100 101 103  CO2 27 28 28 26 28  $ GLUCOSE 101* 113* 122* 109* 101*  BUN 10 10 16 20 $ 31*  CREATININE 0.88 0.94 0.85 0.88 0.93  CALCIUM 8.3* 8.6* 8.7* 8.6* 8.8*    CBC: Recent Labs  Lab 08/01/22 0012 08/01/22 0652 08/02/22 1058 08/03/22 0708 08/07/22 0828  WBC 9.4 9.6 10.3 10.0 7.8  NEUTROABS  --   --   --   --  4.5  HGB 13.9 14.0 13.5 13.5 12.4*  HCT 40.5 40.1 38.7* 39.2 36.6*  MCV 93.1 91.3 93.0 92.2 93.4  PLT 148* 149* 159 185 280    Coagulation Studies: No results for input(s): "LABPROT", "INR" in the last 72 hours.      Imaging and Diagnostic studies  Results for orders placed during the hospital encounter of 07/11/22  MR BRAIN WO CONTRAST  Narrative CLINICAL DATA:  Initial evaluation for multiple sclerosis.  EXAM: MRI HEAD WITHOUT CONTRAST  TECHNIQUE: Multiplanar,  multiecho pulse sequences of the brain and surrounding structures were obtained without intravenous contrast.  COMPARISON:  Prior MRI from 06/04/2021.  FINDINGS: Brain: Diffuse prominence of the CSF containing spaces, consistent with atrophy, advanced for age. Again seen are extensive patchy and confluent T2/FLAIR signal abnormality involving the periventricular, deep, and juxta cortical white matter both cerebral hemispheres. Patchy involvement of the descending white matter tracks, brainstem, and cerebellum. Overall, appearance is consistent with provided history of multiple sclerosis. These changes are progressed as compared  to most recent MRI from 06/04/2021, with a few new and/or larger lesions (series 11, image 19 at the right frontal centrum semi ovale for example). Several of these lesions demonstrate prominent diffusion signal abnormality. While this could be related to T2 shine through, overall appearance is suspicious for possible active demyelination, although evaluation limited by lack of IV contrast.  No evidence for acute or subacute infarct. Gray-white matter differentiation otherwise maintained. No other areas of chronic cortical infarction. No acute or chronic intracranial blood products.  No mass lesion, midline shift or mass effect. No hydrocephalus or extra-axial fluid collection. Pituitary gland and suprasellar region within normal limits.  Vascular: Major intracranial vascular flow voids are maintained.  Skull and upper cervical spine: Cranial junctional limits. Bone marrow signal intensity normal. No scalp soft tissue abnormality.  Sinuses/Orbits: Globes orbital soft tissues within normal limits. Paranasal sinuses are largely clear. No mastoid effusion.  Other: None.  IMPRESSION: 1. Extensive T2/FLAIR signal abnormality involving the cerebral white matter as above, consistent with provided history of multiple sclerosis. Overall, these changes are progressed as compared to most recent MRI from 06/04/2021. Associated prominent diffusion signal abnormality about many of these lesions. While this could be related to T2 shine through, overall appearance is suspicious for possible active demyelination. Correlation with dedicated postcontrast imaging could be performed for confirmatory purposes as warranted. 2. Underlying advanced cerebral atrophy for age.   Electronically Signed By: Jeannine Boga M.D. On: 07/11/2022 20:03   Impression   Bradley Brandt is a 42 y.o. male with PMH significant for MS and severe relapse in the setting of Tysabri rebound. His neurologic  examination is notable for left arm weakness, improving from prior.  Recommendations  -IVIG 2g/kg divided over 5 days, today is day 4/5 -Benadryl 12.5 mg prior to IVIG -Will follow ______________________________________________________________________   Thank you for the opportunity to take part in the care of this patient. If you have any further questions, please contact the neurology consultation attending.  Signed,  Coralyn Pear, MD PGY-1, Psychiatry Resident   Attending Neurologist's note:  I personally saw this patient, gathering history, performing a full neurologic examination, reviewing relevant labs,  and formulated the assessment and plan, adding the note above for completeness and clarity to accurately reflect my thoughts

## 2022-08-08 DIAGNOSIS — G35 Multiple sclerosis: Secondary | ICD-10-CM | POA: Diagnosis not present

## 2022-08-08 LAB — URINALYSIS, COMPLETE (UACMP) WITH MICROSCOPIC: RBC / HPF: >50 RBC/hpf (ref 0–5)

## 2022-08-08 LAB — COMPREHENSIVE METABOLIC PANEL
ALT: 44 U/L (ref 0–44)
AST: 30 U/L (ref 15–41)
Albumin: 2 g/dL — ABNORMAL LOW (ref 3.5–5.0)
Alkaline Phosphatase: 44 U/L (ref 38–126)
Anion gap: 7 (ref 5–15)
BUN: 27 mg/dL — ABNORMAL HIGH (ref 6–20)
CO2: 27 mmol/L (ref 22–32)
Calcium: 8.6 mg/dL — ABNORMAL LOW (ref 8.9–10.3)
Chloride: 101 mmol/L (ref 98–111)
Creatinine, Ser: 0.85 mg/dL (ref 0.61–1.24)
GFR, Estimated: >60 mL/min (ref >60)
Glucose, Bld: 95 mg/dL (ref 70–99)
Potassium: 4 mmol/L (ref 3.5–5.1)
Sodium: 135 mmol/L (ref 135–145)
Total Bilirubin: 0.2 mg/dL — ABNORMAL LOW (ref 0.3–1.2)
Total Protein: 6.7 g/dL (ref 6.5–8.1)

## 2022-08-08 LAB — CBC
HCT: 35 % — ABNORMAL LOW (ref 39.0–52.0)
Hemoglobin: 12.1 g/dL — ABNORMAL LOW (ref 13.0–17.0)
MCH: 32.5 pg (ref 26.0–34.0)
MCHC: 34.6 g/dL (ref 30.0–36.0)
MCV: 94.1 fL (ref 80.0–100.0)
Platelets: 290 10*3/uL (ref 150–400)
RBC: 3.72 MIL/uL — ABNORMAL LOW (ref 4.22–5.81)
RDW: 13.2 % (ref 11.5–15.5)
WBC: 9.2 10*3/uL (ref 4.0–10.5)
nRBC: 1.3 % — ABNORMAL HIGH (ref 0.0–0.2)

## 2022-08-08 NOTE — Progress Notes (Signed)
NEUROLOGY CONSULTATION PROGRESS NOTE   Date of service: August 08, 2022 Patient Name: Bradley Brandt MRN:  [id number] DOB:  Nov 15, 1980  Brief HPI  Bradley Brandt is a 42 y.o. male with PMH significant for  has a past medical history of Bipolar 1 disorder ([place]), Bipolar disorder, unspecified ([place]) (01/03/2013), MS (multiple sclerosis) ([place]) (dx 2006), and Neuromuscular disorder ([place]). who presents with progressive weakness.   Interval Hx  Reports he feels like he is continuing to improve, no acute complaints today  Vitals   Current vital signs: BP 124/76   Pulse 70   Temp 98.4 F (36.9 C) (Oral)   Resp 18   Ht 5' 11"$  (1.803 m)   Wt 82.4 kg   SpO2 100%   BMI 25.34 kg/m  Vital signs in last 24 hours: Temp:  [97.7 F (36.5 C)-98.4 F (36.9 C)] 98.4 F (36.9 C) (02/20 1129) Pulse Rate:  [70-79] 70 (02/20 1213) Resp:  [18] 18 (02/20 0529) BP: (121-136)/(70-86) 124/76 (02/20 1213) SpO2:  [95 %-100 %] 100 % (02/20 1213)   Body mass index is 25.34 kg/m.  Physical Exam   General: Laying comfortably in bed; in no acute distress.  HENT: Normal oropharynx and mucosa. Normal external appearance of ears and nose.  Neck: Supple, no pain or tenderness  CV: No JVD. No peripheral edema.  Pulmonary: Symmetric Chest rise. Normal respiratory effort.  Abdomen: Soft to touch, non-tender.  Ext: No cyanosis, edema, or deformity  Skin: No rash. Normal palpation of skin.   Musculoskeletal: Normal digits and nails by inspection. No clubbing.   Neurologic Examination   Physical Exam  Constitutional: Appears well-developed and well-nourished.  Psych: Affect appropriate to situation Eyes: No scleral injection HENT: No OP obstrucion MSK: no joint deformities.  Cardiovascular: Normal rate and regular rhythm.  Respiratory: Effort normal, non-labored breathing GI: Soft.  No distension. There is no tenderness.  Skin: WDI  Neuro: Mental Status: Patient is awake, alert, oriented to  person, place, month, year, and situation. Continues to have some intermittent stuttering and clear word finding difficulty but overall markedly improved aphasia from initial examination  Cranial Nerves: Notable head tilts to the right remains.  He reports slight worsening of his double vision with straightening his head.  Today is able to report that his diplopia is improving but most obvious on right gaze.  Intermittent difficulty ABducting right eye, without nystagmus of the other eye, intact EOM of the left eye.  Slightly saccadic pursuits but improved from yesterday.  Reports symmetric facial sensation to temperature and light touch.  Slight right facial droop.  Hearing intact to voice.  Uvula elevates symmetrically, shoulder shrug is slightly reduced on the left side, tongue is midline Motor:  Right   Left  Arm abduction  4  2  Elbow flexion  4+  2-3  Elbow extension  4  2-3  Wrist flexion  4+  2-3  Wrist extension  4  2  Finger extension  4  2  Finger flexion  4  2-3  Hip flexion  2  2  Knee extension  4+  4+  Knee flexion  4-  4  Foot dorsiflexion  1-2  4-   Foot plantarflexion  4+  4+  Deep Tendon Reflexes: 3+ and symmetric in the biceps, brachioradialis, positive Hoffmann's bilaterally, 3+ and symmetric patellae, 4 beats of clonus at the bilateral Achilles.  Sensation: symmetric throughout, denies any patchy loss of sensation    Labs  Basic Metabolic Panel: Recent Labs  Lab 08/02/22 1058 08/04/22 0209 08/06/22 0254 08/07/22 0337 08/08/22 0012  NA 139 137 136 138 135  K 3.9 4.2 4.4 4.2 4.0  CL 104 100 101 103 101  CO2 28 28 26 28 27  $ GLUCOSE 113* 122* 109* 101* 95  BUN 10 16 20 $ 31* 27*  CREATININE 0.94 0.85 0.88 0.93 0.85  CALCIUM 8.6* 8.7* 8.6* 8.8* 8.6*     CBC: Recent Labs  Lab 08/02/22 1058 08/03/22 0708 08/07/22 0828 08/08/22 0012  WBC 10.3 10.0 7.8 9.2  NEUTROABS  --   --  4.5  --   HGB 13.5 13.5 12.4* 12.1*  HCT 38.7* 39.2 36.6* 35.0*  MCV  93.0 92.2 93.4 94.1  PLT 159 185 280 290     Coagulation Studies: No results for input(s): "LABPROT", "INR" in the last 72 hours.      Imaging and Diagnostic studies  Results for orders placed during the hospital encounter of 07/11/22  MR BRAIN WO CONTRAST  Narrative CLINICAL DATA:  Initial evaluation for multiple sclerosis.  EXAM: MRI HEAD WITHOUT CONTRAST  TECHNIQUE: Multiplanar, multiecho pulse sequences of the brain and surrounding structures were obtained without intravenous contrast.  COMPARISON:  Prior MRI from 06/04/2021.  FINDINGS: Brain: Diffuse prominence of the CSF containing spaces, consistent with atrophy, advanced for age. Again seen are extensive patchy and confluent T2/FLAIR signal abnormality involving the periventricular, deep, and juxta cortical white matter both cerebral hemispheres. Patchy involvement of the descending white matter tracks, brainstem, and cerebellum. Overall, appearance is consistent with provided history of multiple sclerosis. These changes are progressed as compared to most recent MRI from 06/04/2021, with a few new and/or larger lesions (series 11, image 19 at the right frontal centrum semi ovale for example). Several of these lesions demonstrate prominent diffusion signal abnormality. While this could be related to T2 shine through, overall appearance is suspicious for possible active demyelination, although evaluation limited by lack of IV contrast.  No evidence for acute or subacute infarct. Gray-white matter differentiation otherwise maintained. No other areas of chronic cortical infarction. No acute or chronic intracranial blood products.  No mass lesion, midline shift or mass effect. No hydrocephalus or extra-axial fluid collection. Pituitary gland and suprasellar region within normal limits.  Vascular: Major intracranial vascular flow voids are maintained.  Skull and upper cervical spine: Cranial junctional limits.  Bone marrow signal intensity normal. No scalp soft tissue abnormality.  Sinuses/Orbits: Globes orbital soft tissues within normal limits. Paranasal sinuses are largely clear. No mastoid effusion.  Other: None.  IMPRESSION: 1. Extensive T2/FLAIR signal abnormality involving the cerebral white matter as above, consistent with provided history of multiple sclerosis. Overall, these changes are progressed as compared to most recent MRI from 06/04/2021. Associated prominent diffusion signal abnormality about many of these lesions. While this could be related to T2 shine through, overall appearance is suspicious for possible active demyelination. Correlation with dedicated postcontrast imaging could be performed for confirmatory purposes as warranted. 2. Underlying advanced cerebral atrophy for age.   Electronically Signed By: Jeannine Boga M.D. On: 07/11/2022 20:03   Impression   Bradley Brandt is a 42 y.o. male with PMH significant for MS and severe relapse in the setting of Tysabri rebound. His neurologic examination is notable for left arm weakness, improving from prior.  Recommendations  -IVIG 2g/kg divided over 5 days, today is day 5/5 -Benadryl 12.5 mg prior to IVIG -Neurology will be available as needed going forward, patient should  follow-up closely with outpatient neurologist Dr. Felecia Shelling on discharge ______________________________________________________________________  Attending Neurologist's note:  I personally saw this patient, gathering history, performing a full neurologic examination, reviewing relevant labs,  and formulated the assessment and plan, adding the note above for completeness and clarity to accurately reflect my thoughts

## 2022-08-08 NOTE — Plan of Care (Signed)
  Problem: Clinical Measurements: Goal: Respiratory complications will improve Outcome: Progressing Goal: Cardiovascular complication will be avoided Outcome: Progressing   Problem: Elimination: Goal: Will not experience complications related to bowel motility Outcome: Progressing Goal: Will not experience complications related to urinary retention Outcome: Progressing

## 2022-08-08 NOTE — Progress Notes (Signed)
Subjective:   Continues to feel improved today.  Weakness continues to improve.  Speech continues to improve.  Patient is eating more.  He is happy with his progress and is looking forward to going home soon.  Discussed that today is his last day of IVIG treatment.  Discussed that he will need SNF to regain his strength prior to going home.  Objective:  Vital signs in last 24 hours: Vitals:   08/07/22 1732 08/07/22 2041 08/07/22 2115 08/08/22 0529  BP: 121/78 129/70 129/70 136/86  Pulse: 76 73 73 76  Resp:  18  18  Temp:  97.7 F (36.5 C)  98.1 F (36.7 C)  TempSrc:  Oral  Oral  SpO2: 97% 100%  99%  Weight:      Height:       Physical Exam: General: appears comfortable, speaking in full sentences, smiling HENT: single, mobile swelling at the left posterior cervical region consistent w/ epidermal inclusion cyst, no lymphadenopathy, dry mucous membranes CV: Regular rate, rhythm. No murmurs, rubs, or gallops. No LE swelling.  Pulm: Normal work of breathing on room air. No wheezing or crackles. Abdomen: Soft, non-tender, non-distended. Normoactive bowel sounds. MSK: increased tone of bilateral UE and LE Neuro: Alert and oriented, Cranial nerves intact. 5/5 grip strength of the RUE. 3/5 grip strength of the LUE. 4/5 strength of bilateral LE.  Skin: Warm, dry. No rash.  Skin left hip sloughing with healthy pink tissue underneath.  Assessment/Plan:  Principal Problem:   Multiple sclerosis flare Active Problems:   Relapsing remitting multiple sclerosis (HCC)   Rhabdomyolysis   Acute urinary retention   Fever   Bradley Brandt is 42yo male with relapsing/remitting multiple sclerosis, bipolar 1 disorder, generalized anxiety disorder, and insomnia admitted for rhabdomyolysis and generalized deconditioning likely secondary to progression of MS.    #Multiple sclerosis Last day of IVIG w/ benadryl pre-treatment. Weakness today continues to improve. Pain is well controlled at this  time. Coude catheter was removed yesterday due to hematuria and concern for irritation. Patient able to void 700 mL of urine yesterday. Will bladder scan today to ensure he is not retaining.   -IVIG per neurology with Benadryl for premedication (day 5/5) - Continue baclofen to 20 mg every 6 hours - Continue gabapentin 400 3 times daily - Oxycodone 5 mg every 4 hours as needed for severe pain - Tylenol as needed - PT/OT - Incentive spirometer   #Fever Had intermittent fevers during his hospitalization. Extensive infectious workup negative. Suspect may be medication related. No fevers overnight.   - Continue to trend fever curve/vital signs closely - Continue tylenol PRN - F/u JC virus   #Hematuria Red urine was noted x2 nights ago. Initial UA w/ significant color interference. LDH and blood smear were normal, low suspicion for hemolytic anemia at this time. Hgb remains stable. Suspected secondary to irritation from coude catheter which was removed yesterday. Patient urinating appropriately. Urine has become lighter in appearance. Repeating UA today.  -F/u UA -Trend CBC -Will hold lovenox at this time given concern for hematuria, SCDs   # Protein calorie malnutrition 2/2 debilitation from MS. Nursing staff helping with feeding. No difficulty swallowing. Multivitamin per RD.  -Appreciate RD recommendations -Ensure shakes -CMP   #Left hip wound Fall prior to admission. Cleanse, pat dry, mupirocin w/ foam dressing per wound care.  -Appreciate wound care recommendations -Roll patient q2 hours   #Rhabdomyolysis - resolved   #Bipolar 1 disorder #Generalized anxiety disorder Continue home medications. -  Duloxetine 30 mg daily - Quetiapine 200 mg nightly   #Dispo Reached out to TOC today. Pending SNF placement. -Appreciate TOC help   Diet: regular Bowel: senna VTE: SCDs IVF: none Code: Full PT/OT recs: SNF, LUE resting hand splint, hospital bed     Prior to Admission Living  Arrangement: at home alone Anticipated Discharge Location: TBD Barriers to Discharge: continued management Dispo: Anticipated discharge in approximately more than 2 day(s).  Starlyn Skeans, MD 08/08/2022, 6:41 AM Pager: [phone] After 5pm on weekdays and 1pm on weekends: On Call pager [phone]

## 2022-08-08 NOTE — TOC Progression Note (Signed)
Transition of Care [facility]) - Initial/Assessment Note    Patient Details  Name: Bradley Brandt MRN: [id number] Date of Birth: January 17, 1981  [facility]) CM/SW Contact:    Milinda Antis, [place] Phone Number: 08/08/2022, 2:10 PM  Clinical Narrative:                 LCSW met with patient at bedside and presented bed offers. The patient choose [facility].  LCSW confirmed with facility and facility will begin insurance auth.    Patient's PASRR number is [id number] E active through 08/30/2022.  TOC following.  Expected Discharge Plan: Skilled Nursing Facility Barriers to Discharge: SNF Pending bed offer, Continued Medical Work up   Patient Goals and CMS Choice            Expected Discharge Plan and Services       Living arrangements for the past 2 months: Single Family Home                                      Prior Living Arrangements/Services Living arrangements for the past 2 months: Single Family Home Lives with:: Self Patient language and need for interpreter reviewed:: Yes        Need for Family Participation in Patient Care: Yes (Comment) Care giver support system in place?: Yes (comment)   Criminal Activity/Legal Involvement Pertinent to Current Situation/Hospitalization: No - Comment as needed  Activities of Daily Living Home Assistive Devices/Equipment: Cane (specify quad or straight), Walker (specify type) (straight cane and front wheel walker) ADL Screening (condition at time of admission) Patient's cognitive ability adequate to safely complete daily activities?: Yes Is the patient deaf or have difficulty hearing?: No Does the patient have difficulty seeing, even when wearing glasses/contacts?: No Does the patient have difficulty concentrating, remembering, or making decisions?: No Patient able to express need for assistance with ADLs?: Yes Does the patient have difficulty dressing or bathing?: No Independently performs ADLs?: Yes  (appropriate for developmental age) Does the patient have difficulty walking or climbing stairs?: Yes Weakness of Legs: Both Weakness of Arms/Hands: Both  Permission Sought/Granted                  Emotional Assessment Appearance:: Appears stated age Attitude/Demeanor/Rapport: Lethargic Affect (typically observed): Accepting Orientation: : Oriented to Self, Oriented to Place, Oriented to  Time, Oriented to Situation Alcohol / Substance Use: Not Applicable Psych Involvement: No (comment)  Admission diagnosis:  Rhabdomyolysis [M62.82] Weakness [R53.1] Thrombocytopenia (HCC) [D69.6] Elevated transaminase level [R74.01] Normochromic normocytic anemia [D64.9] Non-traumatic rhabdomyolysis [M62.82] Patient Active Problem List   Diagnosis Date Noted   Acute urinary retention 08/01/2022   Fever 08/01/2022   Rhabdomyolysis 07/30/2022   Multiple sclerosis exacerbation ([place]) 07/12/2022   Marijuana use 11/22/2020   History of marijuana use 11/22/2020   Chronic pain syndrome 11/21/2020   Pharmacologic therapy 11/21/2020   Disorder of skeletal system 11/21/2020   Problems influencing health status 11/21/2020   MS (multiple sclerosis) ([place])    Neuromuscular disorder (HCC)    Bipolar disorder in full remission ([place]) 11/16/2020   Other insomnia 08/11/2020   Generalized anxiety disorder 08/11/2020   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[place]) 12/31/2017   Optic neuritis 08/02/2017   High risk medication use 08/02/2017   Vitamin D deficiency 09/19/2016   Hypercholesterolemia 09/19/2016   Numbness 12/16/2015   Spastic gait 12/16/2015   Urinary hesitancy 12/16/2015   Low back  pain with sciatica 09/28/2014   Polysubstance dependence ([place]) 02/09/2014   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 [place] [address] Phone: [phone] Fax: [phone]  [id number] [address] [address]Phone: [phone]: [phone]    Social Determinants of Health (SDOH) Social History: [place] Food Insecurity Present (07/31/2022)  Housing: Low Risk  (07/31/2022)  Transportation Needs: No Transportation Needs (07/31/2022)  Utilities: Not At Risk (07/31/2022)  Depression (PHQ2-9): Low Risk  (10/12/2021)  Recent Concern: Depression (PHQ2-9) - Medium Risk (08/04/2021)  Tobacco Use: High Risk (07/30/2022)   SDOH Interventions:     Readmission Risk Interventions     No data to display

## 2022-08-09 LAB — CBC
HCT: 35.5 % — ABNORMAL LOW (ref 39.0–52.0)
Hemoglobin: 12 g/dL — ABNORMAL LOW (ref 13.0–17.0)
MCH: 31.6 pg (ref 26.0–34.0)
MCHC: 33.8 g/dL (ref 30.0–36.0)
MCV: 93.4 fL (ref 80.0–100.0)
Platelets: 294 10*3/uL (ref 150–400)
RBC: 3.8 MIL/uL — ABNORMAL LOW (ref 4.22–5.81)
RDW: 12.8 % (ref 11.5–15.5)
WBC: 9.3 10*3/uL (ref 4.0–10.5)
nRBC: 1.3 % — ABNORMAL HIGH (ref 0.0–0.2)

## 2022-08-09 LAB — CULTURE, BLOOD (ROUTINE X 2)
Culture: NO GROWTH
Culture: NO GROWTH
Special Requests: ADEQUATE
Special Requests: ADEQUATE

## 2022-08-09 LAB — JC VIRUS, PCR CSF: JC Virus PCR, CSF: NEGATIVE

## 2022-08-09 MED ORDER — ACETAMINOPHEN 500 MG PO TABS
1000.0000 mg | ORAL_TABLET | Freq: Four times a day (QID) | ORAL | Status: DC | PRN
  Administered 2022-08-09 – 2022-08-10 (×2): 1000 mg via ORAL
  Filled 2022-08-09 (×2): qty 2

## 2022-08-09 MED ORDER — PHENAZOPYRIDINE HCL 200 MG PO TABS
200.0000 mg | ORAL_TABLET | Freq: Three times a day (TID) | ORAL | Status: DC
  Administered 2022-08-09 – 2022-08-10 (×5): 200 mg via ORAL
  Filled 2022-08-09 (×6): qty 1

## 2022-08-09 NOTE — Progress Notes (Signed)
Physical Therapy Treatment Patient Details Name: Bradley Brandt MRN: [id number] DOB: [birth year] Today's Date: 08/09/2022   History of Present Illness Pt is a 42 y.o. male presents to [facility] hospital on 2/11 with worsening generalized weakness.  Admitted for rhabdomyolysis. Noted pt with hx of MS and multiple recent falls, recently dc'd for [facility] 1/30 for MS flare. MRI 2/12 with "multifocal progressed and active demyelinating disease since MRI last month, most notably in L brainstem and R centrum semiovale". Recieving IVIG.  PMH includes relapsing remitting MS, bipolar disorder, GAD, insomnia.    PT Comments    Pt with continued progress towards acute goals. Session focused on sitting balance/core stability and strengthening and sitting posture. Pt continues to demonstrate increased movement of bil UE and Les and continued improvement in bed mobility requiring mod A x2 to complete this date. Pt continues to require max A x2 and constant hands on assist to maintain static sitting balance EOB secondary to poor core strength and posterior lean. Pt lifted to recliner with maximove at end of session for OOB. Pt continues to benefit from skilled PT services to progress toward functional mobility goals.    Recommendations for follow up therapy are one component of a multi-disciplinary discharge planning process, led by the attending physician.  Recommendations may be updated based on patient status, additional functional criteria and insurance authorization.  Follow Up Recommendations  Skilled nursing-short term rehab (<3 hours/day) Can patient physically be transported by private vehicle: No   Assistance Recommended at Discharge Frequent or constant Supervision/Assistance  Patient can return home with the following Two people to help with walking and/or transfers;Two people to help with bathing/dressing/bathroom;Assistance with cooking/housework;Assistance with feeding;Direct supervision/assist for  medications management;Assist for transportation;Direct supervision/assist for financial management;Help with stairs or ramp for entrance   Equipment Recommendations  Hospital bed (Pending progress - specialty seating and power w/c.)    Recommendations for Other Services Rehab consult     Precautions / Restrictions Precautions Precautions: Fall Precaution Comments: severe left lean and posterior lean Restrictions Weight Bearing Restrictions: No     Mobility  Bed Mobility Overal bed mobility: Needs Assistance Bed Mobility: Rolling, Sidelying to Sit Rolling: Mod assist, +2 for physical assistance, +2 for safety/equipment Sidelying to sit: Mod assist, +2 for physical assistance, +2 for safety/equipment       General bed mobility comments: pt able to initate LEs to EOB and reaching for rail with R UE to assist. use of bed pad to scoot hips out to EOB    Transfers Overall transfer level: Needs assistance   Transfers: Bed to chair/wheelchair/BSC             General transfer comment: maximove pad placed at EOB with pt leaning anteior and laterally and pt lifted to chair Transfer via Lift Equipment: Maximove  Ambulation/Gait                   Stairs             Wheelchair Mobility    Modified Rankin (Stroke Patients Only)       Balance Overall balance assessment: Needs assistance Sitting-balance support: Single extremity supported, Bilateral upper extremity supported, No upper extremity supported Sitting balance-Leahy Scale: Poor Sitting balance - Comments: total assist but with UE placement on bed decreased to max assist to EOB statically.  Heavy R lateral and poaterior lean.  Worked on upright posture of trunk, as well has head/neck. VERy brief period of pt able to maintain static sitting  with CGA towards Postural control: Right lateral lean                                  Cognition Arousal/Alertness: Awake/alert Behavior During  Therapy: WFL for tasks assessed/performed Overall Cognitive Status: No family/caregiver present to determine baseline cognitive functioning Area of Impairment: Problem solving, Following commands, Memory, Attention, Safety/judgement, Awareness, Orientation                   Current Attention Level: Sustained Memory: Decreased short-term memory, Decreased recall of precautions Following Commands: Follows one step commands consistently, Follows one step commands with increased time Safety/Judgement: Decreased awareness of deficits, Decreased awareness of safety Awareness: Emergent Problem Solving: Slow processing, Decreased initiation, Difficulty sequencing, Requires verbal cues, Requires tactile cues General Comments: mulitmodal cueing at times for motor output        Exercises Other Exercises Other Exercises: gentle stretching to neck, focusing on R upper trap, R levator scapulae, R SCM.    General Comments        Pertinent Vitals/Pain Pain Assessment Pain Assessment: Faces Faces Pain Scale: Hurts whole lot Pain Location: L shoulder Pain Descriptors / Indicators: Grimacing, Guarding, Burning, Moaning Pain Intervention(s): Monitored during session, Limited activity within patient's tolerance, Repositioned    Home Living                          Prior Function            PT Goals (current goals can now be found in the care plan section) Acute Rehab PT Goals PT Goal Formulation: With patient Time For Goal Achievement: 08/13/22 Progress towards PT goals: Progressing toward goals    Frequency    Min 3X/week      PT Plan      Co-evaluation PT/OT/SLP Co-Evaluation/Treatment: Yes Reason for Co-Treatment: Complexity of the patient's impairments (multi-system involvement);To address functional/ADL transfers;For patient/therapist safety PT goals addressed during session: Mobility/safety with mobility;Strengthening/ROM        AM-PAC PT "6 Clicks"  Mobility   Outcome Measure  Help needed turning from your back to your side while in a flat bed without using bedrails?: A Lot Help needed moving from lying on your back to sitting on the side of a flat bed without using bedrails?: Total Help needed moving to and from a bed to a chair (including a wheelchair)?: Total Help needed standing up from a chair using your arms (e.g., wheelchair or bedside chair)?: Total Help needed to walk in hospital room?: Total Help needed climbing 3-5 steps with a railing? : Total 6 Click Score: 7    End of Session Equipment Utilized During Treatment:  (maxi-move pad and lift) Activity Tolerance: Patient tolerated treatment well;Patient limited by pain Patient left: in chair;with call bell/phone within reach;with chair alarm set Nurse Communication: Mobility status PT Visit Diagnosis: Other abnormalities of gait and mobility (R26.89);Pain;Repeated falls (R29.6);Muscle weakness (generalized) (M62.81) Pain - Right/Left:  (bil) Pain - part of body: Arm (back)     Time: [id number] PT Time Calculation (min) (ACUTE ONLY): 30 min  Charges:  $Therapeutic Activity: 8-22 mins                    Nguyen Todorov R. PTA Acute Rehabilitation Services Office: [place] 08/09/2022, 10:55 AM

## 2022-08-09 NOTE — Progress Notes (Signed)
Mobility Specialist Progress Note   08/09/22 1132  Mobility  Activity Transferred from chair to bed  Level of Assistance +2 (takes two people) (safety)  Assistive Device MaxiMove  Activity Response Tolerated well  Mobility Referral Yes  $Mobility charge 1 Mobility   Pt requesting assistance to get from chair to bed d/t increase in pain in LE's. Pt maximoved w/o fault but vocally expressing pain in LE's when rolling side to side in bed. Pain subsiding quickly, pt left w/ needs met, call bell in reach and bed alarm on.  Holland Falling Mobility Specialist Please contact via SecureChat or  Rehab office at [phone]

## 2022-08-09 NOTE — Progress Notes (Addendum)
Subjective:   Patient interviewed while in his chair.  Patient is feeling well overall today.  Reports that his weakness continues to be improved.  Speech is a little bit more difficult today.  He is eating well and having regular bowel movements.  He has pain in his penis that he contributes to his catheter.  Discussed that his hematuria is potentially secondary to an undiagnosed urethral stricture (given how difficult it was to place indwelling catheters).  Discussed plan to continue to monitor.  Denies pain elsewhere. Discussed that SNF placement is pending insurance authorization.  He is looking forward to transferring to [facility].  Objective:  Vital signs in last 24 hours: Vitals:   08/08/22 1158 08/08/22 1213 08/08/22 2119 08/09/22 0421  BP: 130/80 124/76 127/77 120/70  Pulse: 71 70 69 (!) 56  Resp:   18 18  Temp:   98.5 F (36.9 C) 98 F (36.7 C)  TempSrc:   Oral Oral  SpO2: 97% 100% 98% 100%  Weight:      Height:       Physical Exam: General: appears comfortable, speaking slowly in short sentences HENT: single, mobile swelling at the left posterior cervical region consistent w/ epidermal inclusion cyst, dry mucous membranes CV: Regular rate, rhythm. No murmurs, rubs, or gallops. No LE swelling.  Pulm: Normal work of breathing on room air. No wheezing or crackles. Abdomen: Soft, non-tender, non-distended. Normoactive bowel sounds. MSK: increased control of Ue's.  Able to eat using the right hand for utensils.  Holding his head up better today (he was hoyered to the chair) Neuro: Alert and oriented, Cranial nerves intact. 5/5 grip strength of the RUE. 3/5 grip strength of the LUE. 4/5 strength of bilateral LE.  Skin: Warm, dry. No rash. Skin of left hip with healthy pink tissue underneath sloughing.  Assessment/Plan:  Principal Problem:   Multiple sclerosis flare Active Problems:   Relapsing remitting multiple sclerosis (HCC)   Rhabdomyolysis   Acute urinary  retention   Fever  Bradley Brandt is 42yo male with relapsing/remitting multiple sclerosis, bipolar 1 disorder, generalized anxiety disorder, and insomnia admitted for rhabdomyolysis and generalized deconditioning likely secondary to progression of MS.    #Multiple sclerosis Patient finished 5-day course of IVIG yesterday. Weakness and pain continue to be improved. Patient did not require oxycodone yesterday, will discontinue.  Attempted to get in contact with the patient's neurology office with Dr. Felecia Shelling.  The earliest available appointment is in 11/2022.  Will reach out to Dr. Felecia Shelling to see if he can be seen in clinic sooner. - Continue baclofen to 20 mg every 6 hours - Continue gabapentin 400 3 times daily - Discontinue Oxycodone  - Tylenol as needed - PT/OT - Incentive spirometer - F/u w/ neurology as outpatient (Dr. Felecia Shelling)   #Hematuria Red urine noted by nursing staff over the last 2 days. Repeat UA yesterday unchanged w/ red, turbid urine and > 50 RBCs. Workup for hemolytic anemia negative. Hgb remains stable. Likely secondary to irritation from coude catheter that was removed 2 days ago. It is possible that patient had urethral stricture that was irritated by catheter. 2.3L UOP yesterday. Will continue to reassess urine.  -Holding lovenox given concern for hematuria; use SCDs -Phenazopyridine 200 mg TID for pain   # Protein calorie malnutrition Secondary to debilitation from MS. Nursing staff aiding with feeding when needed.  -Appreciate RD recommendations -Ensure shakes -Multivitamin   #Left hip abrasion from fall prior to admission -Appreciate wound care recommendations -  Cleanse, pat dry, mupirocin w/ foam dressing per wound care -Reposition patient q2 hours  #Fever - resolved Experienced intermittent fevers during his hospitalization. Extensive infectious workup negative. Remains afebrile over the last few days. Potentially medication related. - Continue to trend fever  curve/vital signs closely - Continue tylenol PRN - F/u JC virus   #Rhabdomyolysis - resolved   #Bipolar 1 disorder #Generalized anxiety disorder Continue home medications. - Duloxetine 30 mg daily - Quetiapine 200 mg nightly   #Dispo Patient chose [facility]. Pending insurance authorization. -Appreciate TOC help   Diet: regular Bowel: senna VTE: SCDs IVF: none Code: Full PT/OT recs: SNF, LUE resting hand splint, hospital bed     Prior to Admission Living Arrangement: at home alone Anticipated Discharge Location: TBD Barriers to Discharge: continued management Dispo: Anticipated discharge in approximately more than 2 day(s).  Starlyn Skeans, MD 08/09/2022, 6:28 AM Pager: [phone] After 5pm on weekdays and 1pm on weekends: On Call pager [phone]

## 2022-08-09 NOTE — Progress Notes (Signed)
Occupational Therapy Treatment Patient Details Name: Bradley Brandt MRN: [id number] DOB: 01-12-1981 Today's Date: 08/09/2022   History of present illness Pt is a 42 y.o. male presents to [facility] hospital on 2/11 with worsening generalized weakness.  Admitted for rhabdomyolysis. Noted pt with hx of MS and multiple recent falls, recently dc'd for [facility] 1/30 for MS flare. MRI 2/12 with "multifocal progressed and active demyelinating disease since MRI last month, most notably in L brainstem and R centrum semiovale". Recieving IVIG.  PMH includes relapsing remitting MS, bipolar disorder, GAD, insomnia.   OT comments  Patient supine in bed, voices fatigue but eager to participate in OT/PT session.  Patient completing bed mobility with decreased assist today, mod assist +2 but once upright limited by sitting balance unsupported.  Requires max-total assist to maintain upright position at EOB during grooming and UE tasks.  He complains of increased pain in L shoulder, subluxation noted with pain improved with approximating humerus; also noted increased tone and stiffness with scapular movement and provided passive scapular/humeral rhythm range to decrease pain. Educated on importance of supporting L UE and will look into taping of L shoulder.  Noted greatly improved functional use of R UE and ROM of L UE at elbow and hand.  Updated dc plan to [facility], as I believe he truly needs intensive rehab to optimize independence. Will follow acutely.    Recommendations for follow up therapy are one component of a multi-disciplinary discharge planning process, led by the attending physician.  Recommendations may be updated based on patient status, additional functional criteria and insurance authorization.    Follow Up Recommendations  Acute inpatient rehab (3hours/day) ([facility]?)     Assistance Recommended at Discharge Frequent or constant Supervision/Assistance  Patient can return home with the following  Two  people to help with walking and/or transfers;Assistance with cooking/housework;Direct supervision/assist for medications management;Assistance with feeding;Direct supervision/assist for financial management;Assist for transportation;Help with stairs or ramp for entrance;A lot of help with bathing/dressing/bathroom   Equipment Recommendations  Other (comment) (defer)    Recommendations for Other Services      Precautions / Restrictions Precautions Precautions: Fall Precaution Comments: severe left lean and posterior lean Restrictions Weight Bearing Restrictions: No       Mobility Bed Mobility Overal bed mobility: Needs Assistance Bed Mobility: Rolling, Sidelying to Sit Rolling: Mod assist, +2 for physical assistance, +2 for safety/equipment Sidelying to sit: Mod assist, +2 for physical assistance, +2 for safety/equipment       General bed mobility comments: pt able to initate LEs to EOB and reaching for rail with R UE to assist. use of bed pad to scoot hips out to EOB    Transfers Overall transfer level: Needs assistance   Transfers: Bed to chair/wheelchair/BSC             General transfer comment: maximove pad placed at EOB with pt leaning anteior and laterally and pt lifted to chair Transfer via Lift Equipment: Maximove   Balance Overall balance assessment: Needs assistance Sitting-balance support: Single extremity supported, Bilateral upper extremity supported, No upper extremity supported Sitting balance-Leahy Scale: Poor Sitting balance - Comments: total assist but with UE placement on bed decreased to max assist to EOB statically.  Heavy R lateral and poaterior lean.  Worked on upright posture of trunk, as well has head/neck. VERy brief period of pt able to maintain static sitting with CGA towards Postural control: Right lateral lean  ADL either performed or assessed with clinical judgement   ADL Overall ADL's : Needs  assistance/impaired     Grooming: Set up Grooming Details (indicate cue type and reason): pt able to bring washcloth to face with R hand but requires trunk support when not supported             Lower Body Dressing: Total assistance;+2 for physical assistance;+2 for safety/equipment;Bed level   Toilet Transfer: Total assistance;+2 for physical assistance;+2 for safety/equipment Toilet Transfer Details (indicate cue type and reason): using maximove         Functional mobility during ADLs: Moderate assistance;Maximal assistance;+2 for safety/equipment;+2 for physical assistance General ADL Comments: worked on sitting balance, ADLs/UE ROM at EOB before maximove to recliner    Extremity/Trunk Assessment Upper Extremity Assessment RUE Deficits / Details: pt using R UE functionally to engage in ADLs with increased time.  grossly 3+/5 but improved coordination. LUE Deficits / Details: pt significantly limited by shoudler pain today. Subluxation noted, pain improved with approximation of humerus. Increased tone/tightness of scapula. Pt demonstrating improved strength of elbow and hand. LUE: Unable to fully assess due to pain;Shoulder pain with ROM;Subluxation noted;Shoulder pain at rest LUE Sensation: decreased light touch LUE Coordination: decreased fine motor;decreased gross motor            Vision   Additional Comments: scanning L and R with no diplopia today.  Will continue to assess.   Perception     Praxis      Cognition Arousal/Alertness: Awake/alert Behavior During Therapy: WFL for tasks assessed/performed Overall Cognitive Status: No family/caregiver present to determine baseline cognitive functioning Area of Impairment: Problem solving, Following commands, Memory, Attention, Safety/judgement, Awareness                   Current Attention Level: Sustained Memory: Decreased short-term memory, Decreased recall of precautions Following Commands: Follows one  step commands consistently, Follows one step commands with increased time Safety/Judgement: Decreased awareness of deficits, Decreased awareness of safety Awareness: Emergent Problem Solving: Slow processing, Decreased initiation, Difficulty sequencing, Requires verbal cues, Requires tactile cues General Comments: pt internally distracted by pain in L shoulder, he is able to follow simple commands with increased time (multimodal cueing at times for motor output)        Exercises Exercises: Other exercises Other Exercises Other Exercises: gentle stretching to neck, focusing on R upper trap, R levator scapulae, R SCM. Other Exercises: scapular humeral rhythm PROM to L UE    Shoulder Instructions       General Comments educated on importance of L UE shoulder suppport in all positions    Pertinent Vitals/ Pain       Pain Assessment Pain Assessment: Faces Faces Pain Scale: Hurts whole lot Pain Location: L shoulder Pain Descriptors / Indicators: Grimacing, Guarding, Burning, Moaning Pain Intervention(s): Limited activity within patient's tolerance, Monitored during session, Repositioned  Home Living                                          Prior Functioning/Environment              Frequency  Min 3X/week        Progress Toward Goals  OT Goals(current goals can now be found in the care plan section)  Progress towards OT goals: Progressing toward goals  Acute Rehab OT Goals Patient Stated Goal: less pain OT Goal  Formulation: With patient Time For Goal Achievement: 08/13/22 Potential to Achieve Goals: Good  Plan Discharge plan needs to be updated;Frequency remains appropriate    Co-evaluation    PT/OT/SLP Co-Evaluation/Treatment: Yes Reason for Co-Treatment: Complexity of the patient's impairments (multi-system involvement);To address functional/ADL transfers;For patient/therapist safety PT goals addressed during session: Mobility/safety with  mobility;Strengthening/ROM OT goals addressed during session: ADL's and self-care;Strengthening/ROM      AM-PAC OT "6 Clicks" Daily Activity     Outcome Measure   Help from another person eating meals?: A Little Help from another person taking care of personal grooming?: A Little Help from another person toileting, which includes using toliet, bedpan, or urinal?: Total Help from another person bathing (including washing, rinsing, drying)?: A Lot Help from another person to put on and taking off regular upper body clothing?: A Lot Help from another person to put on and taking off regular lower body clothing?: Total 6 Click Score: 12    End of Session    OT Visit Diagnosis: Muscle weakness (generalized) (M62.81);History of falling (Z91.81);Other symptoms and signs involving the nervous system (R29.898);Pain Pain - Right/Left: Left Pain - part of body: Shoulder   Activity Tolerance Patient tolerated treatment well   Patient Left in chair;with call bell/phone within reach;with chair alarm set   Nurse Communication Mobility status        Time: [id number] OT Time Calculation (min): 32 min  Charges: OT General Charges $OT Visit: 1 Visit OT Treatments $Self Care/Home Management : 8-22 mins  Jolaine Artist, [place] Office [phone]   Delight Stare 08/09/2022, 12:04 PM

## 2022-08-10 MED ORDER — PHENAZOPYRIDINE HCL 200 MG PO TABS: 200.0000 mg | ORAL_TABLET | Freq: Three times a day (TID) | ORAL | 0 refills | Status: DC

## 2022-08-10 MED ORDER — GABAPENTIN 400 MG PO CAPS: 400.0000 mg | ORAL_CAPSULE | Freq: Three times a day (TID) | ORAL | 0 refills | Status: DC

## 2022-08-10 MED ORDER — ENSURE ENLIVE PO LIQD: 237.0000 mL | Freq: Three times a day (TID) | ORAL | 12 refills | Status: DC

## 2022-08-10 NOTE — TOC Transition Note (Signed)
Transition of Care [facility]) - CM/SW Discharge Note   Patient Details  Name: Bradley Brandt MRN: [id number] Date of Birth: 1980-11-19  Transition of Care [facility]) CM/SW Contact:  Milinda Antis, [place] Phone Number: 08/10/2022, 1:28 PM   Clinical Narrative:    Patient will DC to:  [facility] Anticipated DC date: 08/10/2022 Family notified: Patient alert and oriented Transport by: Corey Harold   Per MD patient ready for DC to SNF. RN to call report prior to discharge [phone] room 159A). RN, patient, and facility notified of DC. Discharge Summary sent to facility. DC packet on chart. Ambulance transport will be requested for patient.   CSW will sign off for now as social work intervention is no longer needed. Please consult [place] again if new needs arise.    Final next level of care: [place] Barriers to Discharge: Barriers Resolved   Patient Goals and CMS Choice      Discharge Placement                Patient chooses bed at:  [facility] [address]) Patient to be transferred to facility by: [place] Name of family member notified: [place], Mollinedo (Mother) [phone] Patient and family notified of of transfer: 08/10/22  Discharge Plan and Services Additional resources added to the After Visit Summary for                                       Social Determinants of Health (SDOH) Interventions SDOH Screenings   Food Insecurity: Food Insecurity Present (07/31/2022)  Housing: Low Risk  (07/31/2022)  Transportation Needs: No Transportation Needs (07/31/2022)  Utilities: Not At Risk (07/31/2022)  Depression (PHQ2-9): Low Risk  (10/12/2021)  Recent Concern: Depression (PHQ2-9) - Medium Risk (08/04/2021)  Tobacco Use: High Risk (07/30/2022)     Readmission Risk Interventions     No data to display

## 2022-08-10 NOTE — Progress Notes (Signed)
Physical Therapy Treatment Patient Details Name: Bradley Brandt MRN: [id number] DOB: [birth year] Today's Date: 08/10/2022   History of Present Illness Pt is a 42 y.o. male presents to [facility] hospital on 2/11 with worsening generalized weakness.  Admitted for rhabdomyolysis. Noted pt with hx of MS and multiple recent falls, recently dc'd from [facility] 1/30 for MS flare. MRI 2/12 with "multifocal progressed and active demyelinating disease since MRI last month, most notably in L brainstem and R centrum semiovale". Recieving IVIG.  PMH includes relapsing remitting MS, bipolar disorder, GAD, insomnia.    PT Comments    Pt progressing towards physical therapy goals. Was able to demonstrate improved cervical AROM, sitting balance, and active movement of the UE's. Pt with improved active anterior lean and engagement of core muscles. Pt anticipates d/c to SNF today. Recommend rehab services at [facility] facilitate evaluation for power chair and specialty cushion. Will continue to follow and progress as able per POC.    Recommendations for follow up therapy are one component of a multi-disciplinary discharge planning process, led by the attending physician.  Recommendations may be updated based on patient status, additional functional criteria and insurance authorization.  Follow Up Recommendations  Skilled nursing-short term rehab (<3 hours/day) Can patient physically be transported by private vehicle: No   Assistance Recommended at Discharge Frequent or constant Supervision/Assistance  Patient can return home with the following Two people to help with walking and/or transfers;Two people to help with bathing/dressing/bathroom;Assistance with cooking/housework;Assistance with feeding;Direct supervision/assist for medications management;Assist for transportation;Direct supervision/assist for financial management;Help with stairs or ramp for entrance   Equipment Recommendations  Hospital bed (Specialty seating and power  w/c.)    Recommendations for Other Services Rehab consult     Precautions / Restrictions Precautions Precautions: Fall Restrictions Weight Bearing Restrictions: No     Mobility  Bed Mobility Overal bed mobility: Needs Assistance Bed Mobility: Rolling, Sidelying to Sit Rolling: Mod assist, +2 for physical assistance, +2 for safety/equipment Sidelying to sit: Mod assist, +2 for physical assistance, +2 for safety/equipment   Sit to supine: Max assist, +2 for physical assistance   General bed mobility comments: pt able to initate LEs to EOB and reaching for rail with R UE to assist. Increased assist for trunk elevation to full sitting position. Use of bed pad to scoot hips out to EOB.    Transfers                        Ambulation/Gait                   Stairs             Wheelchair Mobility    Modified Rankin (Stroke Patients Only)       Balance Overall balance assessment: Needs assistance Sitting-balance support: Single extremity supported, Bilateral upper extremity supported, No upper extremity supported Sitting balance-Leahy Scale: Poor Sitting balance - Comments: total assist but with UE placement on bed decreased to mod assist statically.  Heavy R lateral and posterior lean at times. Postural control: Right lateral lean                                  Cognition Arousal/Alertness: Awake/alert Behavior During Therapy: WFL for tasks assessed/performed Overall Cognitive Status: No family/caregiver present to determine baseline cognitive functioning Area of Impairment: Problem solving, Following commands, Memory, Attention, Safety/judgement, Awareness  Orientation Level: Disoriented to, Time Current Attention Level: Sustained Memory: Decreased short-term memory, Decreased recall of precautions Following Commands: Follows one step commands consistently, Follows one step commands with increased  time Safety/Judgement: Decreased awareness of deficits, Decreased awareness of safety Awareness: Emergent Problem Solving: Slow processing, Decreased initiation, Difficulty sequencing, Requires verbal cues, Requires tactile cues General Comments: Distracted by pain but able to be redirected        Exercises Other Exercises Other Exercises: gentle stretching to neck, focusing on R upper trap, R SCM. Other Exercises: Dynamic reaching activity with RUE. Mod-max assist to recover trunk to midline when reaching L with RUE. Other Exercises: Trunk activation for anterior lean with B UE's sliding down LE's.    General Comments        Pertinent Vitals/Pain Pain Assessment Pain Assessment: Faces Faces Pain Scale: Hurts whole lot Pain Location: L shoulder Pain Descriptors / Indicators: Grimacing, Guarding, Burning, Moaning    Home Living                          Prior Function            PT Goals (current goals can now be found in the care plan section) Acute Rehab PT Goals Patient Stated Goal: Decreased pain in L shoulder and R hip PT Goal Formulation: With patient Time For Goal Achievement: 08/13/22 Potential to Achieve Goals: Fair Progress towards PT goals: Progressing toward goals    Frequency    Min 3X/week      PT Plan Current plan remains appropriate    Co-evaluation PT/OT/SLP Co-Evaluation/Treatment: Yes Reason for Co-Treatment: Complexity of the patient's impairments (multi-system involvement);To address functional/ADL transfers;For patient/therapist safety PT goals addressed during session: Mobility/safety with mobility;Strengthening/ROM;Balance        AM-PAC PT "6 Clicks" Mobility   Outcome Measure  Help needed turning from your back to your side while in a flat bed without using bedrails?: A Lot Help needed moving from lying on your back to sitting on the side of a flat bed without using bedrails?: Total Help needed moving to and from a bed to  a chair (including a wheelchair)?: Total Help needed standing up from a chair using your arms (e.g., wheelchair or bedside chair)?: Total Help needed to walk in hospital room?: Total Help needed climbing 3-5 steps with a railing? : Total 6 Click Score: 7    End of Session   Activity Tolerance: Patient tolerated treatment well;Patient limited by pain Patient left: in chair;with call bell/phone within reach;with chair alarm set Nurse Communication: Mobility status PT Visit Diagnosis: Other abnormalities of gait and mobility (R26.89);Pain;Repeated falls (R29.6);Muscle weakness (generalized) (M62.81) Pain - Right/Left: Left Pain - part of body: Arm     Time: 1101-1141 PT Time Calculation (min) (ACUTE ONLY): 40 min  Charges:  $Therapeutic Activity: 23-37 mins                     Rolinda Roan, PT, DPT Acute Rehabilitation Services Secure Chat Preferred Office: [phone]    Thelma Comp 08/10/2022, 1:11 PM

## 2022-08-10 NOTE — Progress Notes (Signed)
Patient discharged to [facility] via [place]. Belongings sent with patient.

## 2022-08-10 NOTE — Discharge Summary (Addendum)
Name: Bradley Brandt MRN: [id number] DOB: September 26, 1980 42 y.o. PCP: Tresa Garter, MD  Date of Admission: 07/30/2022  1:01 AM Date of Discharge: 08/10/2022 Attending Physician: Angelica Pou, MD  Discharge Diagnosis: 1. Principal Problem:   Multiple sclerosis flare Active Problems:   Relapsing remitting multiple sclerosis (HCC)   Hematuria   Protein Calorie Malnutrition   Left hip wound, abrasion due to fall   Bipolar Disorder Type 1   Generalized Anxiety Disorder  Discharge Medications: Allergies as of 08/10/2022       Reactions   Lithium Rash        Medication List     STOP taking these medications    gabapentin 800 MG tablet Commonly known as: Neurontin Replaced by: gabapentin 400 MG capsule   predniSONE 50 MG tablet Commonly known as: DELTASONE       TAKE these medications    acetaminophen 500 MG tablet Commonly known as: TYLENOL Take 500 mg by mouth every 8 (eight) hours as needed for moderate pain.   baclofen 20 MG tablet Commonly known as: LIORESAL Take 1 tablet (20 mg total) by mouth 3 (three) times daily.   DULoxetine 30 MG capsule Commonly known as: CYMBALTA Take 1 capsule (30 mg total) by mouth daily.   feeding supplement Liqd Take 237 mLs by mouth 3 (three) times daily between meals.   gabapentin 400 MG capsule Commonly known as: NEURONTIN Take 1 capsule (400 mg total) by mouth 3 (three) times daily. Replaces: gabapentin 800 MG tablet   lidocaine 4 % Commonly known as: HM Lidocaine Patch Place 1 patch onto the skin daily.   natalizumab 300 MG/15ML injection Commonly known as: TYSABRI Inject 15 mLs (300 mg total) into the vein every 28 (twenty-eight) days.   phenazopyridine 200 MG tablet Commonly known as: PYRIDIUM Take 1 tablet (200 mg total) by mouth 3 (three) times daily with meals.   QUEtiapine 200 MG tablet Commonly known as: SEROQUEL Take 1 tablet (200 mg total) by mouth at bedtime. Take a half tablet at  night for 4 days then increase to a whole tablet each night.   Vitamin D (Ergocalciferol) 1.25 MG (50000 UNIT) Caps capsule Commonly known as: DRISDOL Take 1 capsule (50,000 Units total) by mouth every 7 (seven) days.               Discharge Care Instructions  (From admission, onward)           Start     Ordered   08/10/22 0000  Discharge wound care:       Comments: Cleanse left hip wound with NS and pat dry. Apply mupirocin to wound bed.  Cover with foam dressing.  Turn and reposition every two hours.   08/10/22 1223            Disposition and follow-up:   [name] was discharged from [facility] in Good condition.  At the hospital follow up visit please address:  1.    A. Multiple Sclerosis Flare  - Treated w/ 5-day course of IVIG w/ significant improvement, to continue Tysabri infusions as outpatient, will f/u with neurologist Dr. Felecia Shelling as outpatient   B. Hematuria  - Coude catheter was placed for acute urinary retention that resolved, noted to have red urine and RBCs on UA, suspect urethral stricture may have been irritated with catheter, started on Phenazopyridine for pain, recommend outpatient urology follow up  2.  Labs / imaging needed at time of  follow-up: Urinalysis   3.  Pending labs/ test needing follow-up: none  Follow-up Appointments: - Please follow up with your primary care provider Tresa Garter, MD at [facility] at 08/22/2022 on 8:00 AM  Phone: [phone] Address: [address] [place], [place], [place] [address]   - Please follow up with your neurologist Dr. Felecia Shelling at [facility]             - You are currently scheduled for a follow up appointment on 01/09/2023 at 3:00 PM              - I am contacting Dr. Felecia Shelling to see if you can be seen sooner (potentially end of 08/2022 or early 09/2022) Phone: [phone] Address:  [address] [place],  [place], [place] [address]  Hospital Course by problem list:  Bradley Brandt is 42yo male with relapsing/remitting multiple sclerosis, bipolar 1 disorder, generalized anxiety disorder, and insomnia admitted for rhabdomyolysis and generalized deconditioning suspected to be secondary to progression of MS.     #History of relapsing/remitting multiple sclerosis #Declining functional status Presented w/ worsening generalized weakness, dysarthria, and recurrent falls.  Also presented with low back pain suspected to be secondary to his recent falls. Patient reported compliance w/ recent course of prednisone at home. Neuro was consulted and were concerned for incompletely treated Tysabri rebound MS relapse. Ordered stat MRI brain (demonstrating multifocal progressed/active demyelinating disease compared to last month most notably in left brainstem/right semiovale and new paranasal sinus inflammation), MRI C-spine (demonstrating mild progression since 2022 w/o active demyelination, stable cervical spine degeneration, C5-C6 spinal stenosis), and MRI T-spine (demonstrating stable demyelination, right T7 hemicord lesion is more swollen, new 2 cm area of enhancement at left paraspinal muscles overlying T3/T4 spinous process suspected to be post-traumatic vs infectious vs inflammatory myositis).  Neuro considered plasma exchange however concluded that this would decrease the amount of circulating Tysabri and worsen his condition.  Neuro elected to start 5-day course of IVIG.  Patient completed the 5-day course of IVIG with significant improvement of his weakness and dysarthria.  Still has some dysarthria on exam that we suspect is baseline. Home medications prior to admission included gabapentin 800 mg twice daily and baclofen 20 mg 3 times daily.  To help with improving pain throughout the day, gabapentin was switched to 400 mg 3 times daily.  Patient was discharged on gabapentin 400 mg 3 times daily.  Continue his home  baclofen throughout his hospitalization and at discharge.  Also treated lower back and right hip pain with Tylenol.  She also patient's neurologist Dr. Felecia Shelling plan for follow-up after discharge.  He is currently scheduled to see Dr. Felecia Shelling 12/2022.  Currently speaking with Dr. Felecia Shelling about helping the patient get an appointment near the end of 08/2022 or early 09/2022.  Patient also receives Tysabri infusions every 28 days with the last infusion occurring on 07/26/2022. Patient will continue to get Tysabri infusions as an outpatient.  Discharged with hospital bed for home.  #Hematuria Noted to have red urine during his hospitalization. UA x2 w/ RBCs.  Workup for hemolytic anemia was negative.  He required coud catheter for acute urinary retention that has since resolved.  Suspect coude catheter may have interfered with a potential urethral stricture given resistance felt by nursing staff during placement.  Hemoglobin remained stable.  Started patient on Phenazopyridine 200 mg TID for complaint of pain in his penis which has since resolved.  Phenazopyridine was continued at discharge. This medication can turn urine orange and can mask symptoms of UTI.  We recommend that the patient have repeat UA as an outpatient and follow-up with urology for potential urethral stricture.  #Fever - Resolved Developed intermittent fever for 2-3 days during his hospitalization.  Extensive infectious workup was negative including CXR, RVP, Covid/influenza/RSV, UA, CSF cell count, gram stain, protein/glucose, fungal culture, meningitis/encephalitis panel, JC virus.  Suspect that his fever may have been medication related.  Fever resolved several days prior to the day of discharge without any antibiotic therapy.  No leukocytosis.  Fever remains resolved upon day of discharge.  #Rhabdomyolysis - Resolved #Protein calorie malnutrition Initially presented with elevated CK.  Suspected secondary to rhabdomyolysis in the setting of recent  fall.  Patient was treated with IV fluids.  CK trended downwards appropriately.  IV fluids were discontinued.  Kidney function remained normal.  Suspect his protein calorie malnutrition is secondary to debilitation from MS.  Initially required help with eating from nursing staff.  Towards his discharge he was able to eat more independently.  Registered dietitian was consulted and recommended Ensure shakes and multivitamin.  We recommend that the patient continue with Ensure shakes and multivitamin after discharge.   #Acute normocytic anemia - resolved Initially presented with normocytic anemia.  CT abdomen/pelvis was unremarkable.  No signs of head trauma.  Blood smear was unremarkable.  Hemoglobin remained stable throughout his hospitalization.    #Abdominal pain due to acute urinary retention- resolved Patient initially presented with lower abdominal pain.  CT abdomen/pelvis was unremarkable.  Found to be retaining urine on bladder scan.  Coud catheter was placed for few days and then removed.  His abdominal pain resolved after placement of this catheter.  Patient was able to urinate normally afterwards.  Suspect that his abdominal pain was secondary to urinary retention.  Given his history of MS, the patient is at increased risk of urinary retention.  He currently does not need a catheter however this could potentially change in the future.  Please reassess his needs for urinary catheter as an outpatient.  #Left hip wound, abrasion due to fall Presented with left hip wound on admission.  Suspected to be secondary to his fall at home. On exam, has had healthy pink tissue underneath sloughing. No signs of infection. Was seen by wound care with recommendations of cleanse, pat dry, mupirocin w/ foam, and roll patient q2 hours.   #Low Back Pain Presented with lower back pain.  Suspected secondary to his fall at home. MRI T-spine (stable demyelination, right T7 hemicord lesion is more swollen, new 2 cm  area of enhancement at left paraspinal muscles overlying T3/T4 spinous process suspected to be post-traumatic vs infectious vs inflammatory myositis). No signs of sacral ulcer. Suspect that the patient has soreness of his lower back secondary to his fall.  Initially treated with oxycodone however the patient did not require this medication for several days.  Would not like to start this patient on oxycodone as an outpatient if not necessary.  I suspect that his lower back pain will improve with scheduled Tylenol and increase strength training during rehabilitation at SNF.    #Bipolar 1 disorder #Generalized anxiety disorder Continued home duloxetine 30 mg daily and quetiapine 200 mg daily throughout his hospitalization and at discharge.     Discharge Exam:   BP 130/81 (BP Location: Right Arm)   Pulse 77   Temp 98.5 F (36.9 C)   Resp 17  Ht '5\' 11"'$  (1.803 m)   Wt 82.4 kg   SpO2 99%   BMI 25.34 kg/m  General: appears comfortable, speaking slowly in short but complete sentences, some word finding difficulty HENT: single, mobile swelling at the left posterior cervical region consistent w/ epidermal inclusion cyst, no lymphadenopathy CV: Regular rate, rhythm. No murmurs, rubs, or gallops. No LE swelling.  Pulm: Normal work of breathing on room air. No wheezing or crackles. Abdomen: Soft, non-tender, non-distended. Normoactive bowel sounds. MSK: increased tone of bilateral UE and LE Neuro: Alert and oriented, Cranial nerves intact. 5/5 grip strength of the RUE. 3/5 grip strength of the LUE. 4/5 strength of bilateral LE.  Skin: Warm, dry. No rash. Skin of left hip with healthy pink tissue underneath sloughing.  Pertinent Labs, Studies, and Procedures:     Latest Ref Rng & Units 08/09/2022    3:37 AM 08/08/2022   12:12 AM 08/07/2022    8:28 AM  CBC  WBC 4.0 - 10.5 K/uL 9.3  9.2  7.8   Hemoglobin 13.0 - 17.0 g/dL 12.0  12.1  12.4   Hematocrit 39.0 - 52.0 % 35.5  35.0  36.6   Platelets 150  - 400 K/uL 294  290  280        Latest Ref Rng & Units 08/08/2022   12:12 AM 08/07/2022    3:37 AM 08/06/2022    2:54 AM  BMP  Glucose 70 - 99 mg/dL 95  101  109   BUN 6 - 20 mg/dL '27  31  20   '$ Creatinine 0.61 - 1.24 mg/dL 0.85  0.93  0.88   Sodium 135 - 145 mmol/L 135  138  136   Potassium 3.5 - 5.1 mmol/L 4.0  4.2  4.4   Chloride 98 - 111 mmol/L 101  103  101   CO2 22 - 32 mmol/L '27  28  26   '$ Calcium 8.9 - 10.3 mg/dL 8.6  8.8  8.6     CT ABDOMEN PELVIS W CONTRAST  IMPRESSION: 1. No acute finding.  Negative for intra-abdominal hemorrhage. 2. Left nephrolithiasis.   On: 07/30/2022 05:32  MR BRAIN W WO CONTRAST  IMPRESSION: 1. Multifocal progressed and active demyelinating disease since the MRI last month, most notably in the left brainstem and the right centrum semiovale. 2. New paranasal sinus inflammation.   On: 07/31/2022 04:14  MR CERVICAL SPINE W WO CONTRAST  IMPRESSION: 1. Mild progression since 2022, but no active demyelination identified in the cervical spinal cord. 2. Underlying advanced cervical spine degeneration appears stable, with borderline to mild spinal stenosis at C5-C6.   On: 07/31/2022 04:20  MR THORACIC SPINE W WO CONTRAST  IMPRESSION: 1. Stable MRI appearance of thoracic spinal cord demyelination. No cord enhancement to strongly suggest active demyelination, but a right hemicord T7 lesion appears mildly swollen as before. No new cord lesion identified. 2. New 2 cm area of nonspecific signal abnormality and enhancement in the medial left paraspinal muscles overlying the T3 and T4 spinous processes. This could be posttraumatic, infectious or inflammatory myositis. No associated paraspinal fluid collection 3. Thoracic discs and vertebrae remain normal.   On: 07/31/2022 04:29   Discharge Instructions: Discharge Instructions     Call MD for:  extreme fatigue   Complete by: As directed    Call MD for:  persistant dizziness or light-headedness    Complete by: As directed    Call MD for:  redness, tenderness, or signs of infection (pain, swelling, redness,  odor or green/yellow discharge around incision site)   Complete by: As directed    Call MD for:  severe uncontrolled pain   Complete by: As directed    Call MD for:  temperature >100.4   Complete by: As directed    Diet - low sodium heart healthy   Complete by: As directed    Discharge wound care:   Complete by: As directed    Cleanse left hip wound with NS and pat dry. Apply mupirocin to wound bed.  Cover with foam dressing.  Turn and reposition every two hours.   Increase activity slowly   Complete by: As directed       You were hospitalized for a flare of Multiple Sclerosis.   Hospital Course: We treated you with IV medications to help improve you weakness and difficulty speaking. You will go to a skilled nursing facility to get stronger prior to going home. Please follow up with your neurologist regarding your multiple sclerosis. You will continue receiving Tysabri infusions as scheduled. Please follow up with your primary care doctor regarding this hospitalization. It would be best if your primary care doctor would refer you to see urology given the blood in your urine.      Follow-up: - Please follow up with your primary care provider Tresa Garter, MD at [facility] at 08/22/2022 on 8:00 AM  Phone: [phone] Address: [address] [place], [place], [place] [address]   - Please follow up with your neurologist Dr. Felecia Shelling at [facility]             - You are currently scheduled for a follow up appointment on 01/09/2023 at 3:00 PM              - I am contacting Dr. Felecia Shelling to see if you can be seen sooner (potentially end of 08/2022 or early 09/2022) Phone: [phone] Address:  [address] [place], [place], [place] [address]  Signed: Starlyn Skeans, MD 08/10/2022, 12:23 PM   Pager: [phone]

## 2022-08-10 NOTE — Progress Notes (Addendum)
Occupational Therapy Treatment Patient Details Name: Bradley Brandt MRN: [id number] DOB: Mar 04, 1981 Today's Date: 08/10/2022   History of present illness Pt is a 42 y.o. male [facility] to [facility] hospital on 2/11 with worsening generalized weakness.  Admitted for rhabdomyolysis. Noted pt with hx of MS and multiple recent falls, recently dc'd from [facility] 1/30 for MS flare. MRI 2/12 with "multifocal progressed and active demyelinating disease since MRI last month, most notably in L brainstem and R centrum semiovale". Recieving IVIG.  PMH includes relapsing remitting MS, bipolar disorder, GAD, insomnia.   OT comments  Pt supine in bed, incontinent of bowel and requires +2 to complete hygiene.  Continues to demonstrate improved functional use of extremities, but limited by pain.  Deferred taping to L shoulder due to inability to monitor as plan dc to SNF today; continued education on supporting L UE in all positions, using resting hand splint at night.  Requires mod-max assist +2 for bed mobility and worked on sitting balance at EOB today with at best mod assist dynamically.   Will follow acutely.    Recommendations for follow up therapy are one component of a multi-disciplinary discharge planning process, led by the attending physician.  Recommendations may be updated based on patient status, additional functional criteria and insurance authorization.    Follow Up Recommendations  Acute inpatient rehab (3hours/day)     Assistance Recommended at Discharge Frequent or constant Supervision/Assistance  Patient can return home with the following  Two people to help with walking and/or transfers;Assistance with cooking/housework;Direct supervision/assist for medications management;Assistance with feeding;Direct supervision/assist for financial management;Assist for transportation;Help with stairs or ramp for entrance;A lot of help with bathing/dressing/bathroom   Equipment Recommendations  Other (comment)  (defer)    Recommendations for Other Services Speech consult    Precautions / Restrictions Precautions Precautions: Fall Restrictions Weight Bearing Restrictions: No       Mobility Bed Mobility Overal bed mobility: Needs Assistance Bed Mobility: Rolling, Sidelying to Sit Rolling: Mod assist, +2 for physical assistance, +2 for safety/equipment Sidelying to sit: Mod assist, +2 for physical assistance, +2 for safety/equipment   Sit to supine: Max assist, +2 for physical assistance   General bed mobility comments: pt able to initate LEs to EOB and reaching for rail with R UE to assist. Increased assist for trunk elevation to full sitting position. Use of bed pad to scoot hips out to EOB.    Transfers                         Balance Overall balance assessment: Needs assistance Sitting-balance support: Single extremity supported, Bilateral upper extremity supported, No upper extremity supported Sitting balance-Leahy Scale: Poor Sitting balance - Comments: total assist but with UE placement on bed decreased to mod assist statically.  Heavy R lateral and posterior lean at times. Postural control: Right lateral lean                                 ADL either performed or assessed with clinical judgement   ADL Overall ADL's : Needs assistance/impaired                     Lower Body Dressing: Total assistance;+2 for physical assistance;+2 for safety/equipment;Bed level       Toileting- Clothing Manipulation and Hygiene: Total assistance;+2 for physical assistance;+2 for safety/equipment;Bed level       Functional mobility  during ADLs: Moderate assistance;Maximal assistance;+2 for safety/equipment;+2 for physical assistance General ADL Comments: worked on sitting balance EOB    Extremity/Trunk Assessment Upper Extremity Assessment LUE Deficits / Details: less pain today, but subluxation still noted.  reinforced supporting UE with pillow to avoid  UE 'hanging'.            Vision       Perception     Praxis      Cognition Arousal/Alertness: Awake/alert Behavior During Therapy: WFL for tasks assessed/performed Overall Cognitive Status: No family/caregiver present to determine baseline cognitive functioning Area of Impairment: Problem solving, Following commands, Memory, Attention, Safety/judgement, Awareness                 Orientation Level: Disoriented to, Time Current Attention Level: Sustained Memory: Decreased short-term memory, Decreased recall of precautions Following Commands: Follows one step commands consistently, Follows one step commands with increased time Safety/Judgement: Decreased awareness of deficits, Decreased awareness of safety Awareness: Emergent Problem Solving: Slow processing, Decreased initiation, Difficulty sequencing, Requires verbal cues, Requires tactile cues General Comments: Distracted by pain but able to be redirected.  Pt with poor awareness, reports aware of having BM but not notifying anyone        Exercises Exercises: Other exercises Other Exercises Other Exercises: gentle stretching to neck, focusing on R upper trap, R SCM. Other Exercises: Dynamic reaching activity with RUE. Mod-max assist to recover trunk to midline when reaching L with RUE. Other Exercises: Trunk activation for anterior lean with B UE's sliding down LE's.    Shoulder Instructions       General Comments      Pertinent Vitals/ Pain       Pain Assessment Pain Assessment: Faces Faces Pain Scale: Hurts whole lot Pain Location: L shoulder Pain Descriptors / Indicators: Grimacing, Guarding, Burning, Moaning Pain Intervention(s): Limited activity within patient's tolerance, Monitored during session, Repositioned  Home Living                                          Prior Functioning/Environment              Frequency  Min 3X/week        Progress Toward Goals  OT  Goals(current goals can now be found in the care plan section)  Progress towards OT goals: Progressing toward goals  Acute Rehab OT Goals Patient Stated Goal: less pain OT Goal Formulation: With patient Time For Goal Achievement: 08/13/22 Potential to Achieve Goals: Good  Plan Discharge plan remains appropriate;Frequency remains appropriate    Co-evaluation    PT/OT/SLP Co-Evaluation/Treatment: Yes Reason for Co-Treatment: Complexity of the patient's impairments (multi-system involvement);To address functional/ADL transfers;For patient/therapist safety PT goals addressed during session: Mobility/safety with mobility;Strengthening/ROM;Balance        AM-PAC OT "6 Clicks" Daily Activity     Outcome Measure   Help from another person eating meals?: A Little Help from another person taking care of personal grooming?: A Little Help from another person toileting, which includes using toliet, bedpan, or urinal?: Total Help from another person bathing (including washing, rinsing, drying)?: A Lot Help from another person to put on and taking off regular upper body clothing?: A Lot Help from another person to put on and taking off regular lower body clothing?: Total 6 Click Score: 12    End of Session    OT Visit Diagnosis: Muscle weakness (generalized) (M62.81);History  of falling (Z91.81);Other symptoms and signs involving the nervous system (R29.898);Pain Pain - Right/Left: Left Pain - part of body: Shoulder   Activity Tolerance Patient tolerated treatment well   Patient Left in bed;with call bell/phone within reach;with bed alarm set   Nurse Communication Mobility status        Time: [id number] OT Time Calculation (min): 40 min  Charges: OT General Charges $OT Visit: 1 Visit OT Treatments $Self Care/Home Management : 8-22 mins  Jolaine Artist, OT Acute Rehabilitation Services Office [phone]   Delight Stare 08/10/2022, 1:42 PM

## 2022-08-10 NOTE — Progress Notes (Incomplete)
Subjective: ***  ON: requested oxy for BLE pain, gave tylenol 1000 along w/ PM gaba and seroquel   VS: WNL (82F, 82, 18, 113/72, 100% on RA)  Labs: *none ordered today  Up:  Down:  Normal: I: 1624 O: 1900 T: -276 mL  Objective:  Vital signs in last 24 hours: Vitals:   08/09/22 0421 08/09/22 1606 08/09/22 1955 08/10/22 0451  BP: 120/70 124/76 117/78 113/72  Pulse: (!) 56 (!) 101 92 82  Resp: 18 18 18 18  $ Temp: 98 F (36.7 C) 99.2 F (37.3 C) 99.2 F (37.3 C) 98 F (36.7 C)  TempSrc: Oral Oral Oral Oral  SpO2: 100% 96% 94% 100%  Weight:      Height:       *** Physical Exam: General: appears comfortable, speaking slowly in short sentences HENT: single, mobile swelling at the left posterior cervical region consistent w/ epidermal inclusion cyst, dry mucous membranes CV: Regular rate, rhythm. No murmurs, rubs, or gallops. No LE swelling.  Pulm: Normal work of breathing on room air. No wheezing or crackles. Abdomen: Soft, non-tender, non-distended. Normoactive bowel sounds. MSK: increased tone of bilateral UE and LE Neuro: Alert and oriented, Cranial nerves intact. 5/5 grip strength of the RUE. 3/5 grip strength of the LUE. 4/5 strength of bilateral LE.  Skin: Warm, dry. No rash. Skin of left hip with healthy pink tissue underneath sloughing.  Assessment/Plan:  Principal Problem:   Multiple sclerosis flare Active Problems:   Relapsing remitting multiple sclerosis (HCC)   Rhabdomyolysis   Acute urinary retention   Fever   *** Bradley Brandt is 42yo male with relapsing/remitting multiple sclerosis, bipolar 1 disorder, generalized anxiety disorder, and insomnia admitted for rhabdomyolysis and generalized deconditioning likely secondary to progression of MS.    #Multiple sclerosis Completed 5-day course of IVIG. Weakness and pain today***. Patient was not requiring oxycodone for several days so this medication was discontinued. Overnight reported having pain and was  given tylenol, night-time dose of gabapentin, and seroquel. Dr. Felecia Shelling in the process of helping the patient get an appointment near end of 08/2022 or early 09/2022.  - Continue baclofen to 20 mg every 6 hours - Continue gabapentin 400 3 times daily - Tylenol as needed - PT/OT - Incentive spirometer - F/u w/ neurology as outpatient (Dr. Felecia Shelling)   #Hematuria Noted to have red urine. UA x2 w/ RBCs. No concern for hemolytic anemia at this time. Suspect coude catheter may have interfered with a urethral stricture given resistance felt by nursing staff during placement. 1.9L UOP yesterday. Hgb remains stable. Will continue to reassess urine.  -Phenazopyridine 200 mg TID for pain -Holding lovenox given concern for hematuria, SCDs   # Protein calorie malnutrition 2/2 debilitation from MS. Nursing staff intermittently aiding with feeding. Patient has been eating more independently as of yesterday.  -Appreciate RD recommendations -Ensure shakes -Multivitamin    #Left hip wound Likely secondary to fall prior to admission.  -Appreciate wound care recommendations -Cleanse, pat dry, mupirocin w/ foam dressing per wound care -Roll patient q2 hours   #Fever - resolved Had intermittent fevers throughout his hospitalization. Extensive infectious workup negative. Suspect medication related. Remains afebrile.  - Continue to trend fever curve/vital signs closely - Continue tylenol PRN   #Rhabdomyolysis - resolved   #Bipolar 1 disorder #Generalized anxiety disorder Continue home medications. - Duloxetine 30 mg daily - Quetiapine 200 mg nightly   #Dispo Patient chose [facility]. Pending insurance authorization. -Appreciate TOC help  #Lower Back  Pain CT, no oxy,    Diet: regular Bowel: senna VTE: SCDs IVF: none Code: Full PT/OT recs: SNF, LUE resting hand splint, hospital bed     Prior to Admission Living Arrangement: at home alone Anticipated Discharge Location: TBD Barriers to  Discharge: continued management Dispo: Anticipated discharge in approximately more than 2 day(s).  Starlyn Skeans, MD 08/10/2022, 6:29 AM Pager: [phone] After 5pm on weekdays and 1pm on weekends: On Call pager [phone]

## 2022-08-10 NOTE — Discharge Instructions (Addendum)
You were hospitalized for a flare of Multiple Sclerosis.  Hospital Course: We treated you with IV medications to help improve you weakness and difficulty speaking. You will go to a skilled nursing facility to get stronger prior to going home. Please follow up with your neurologist regarding your multiple sclerosis. You will continue receiving Tysabri infusions as scheduled. Please follow up with your primary care doctor regarding this hospitalization. It would be best if your primary care doctor would refer you to see urology given the blood in your urine.    Follow-up: - Please follow up with your primary care provider Tresa Garter, MD at [facility] at 08/22/2022 on 8:00 AM  Phone: [phone] Address: [place] [place], [place], [place] [address]  - Please follow up with your neurologist Dr. Felecia Shelling at [facility]  - You are currently scheduled for a follow up appointment on 01/09/2023 at 3:00 PM   - I am contacting Dr. Felecia Shelling to see if you can be seen sooner (potentially end of 08/2022 or early 09/2022) Phone: [phone] Address:  [address], [place], [place], [place] [address]

## 2022-08-10 NOTE — TOC Progression Note (Addendum)
Transition of Care [facility]) - Initial/Assessment Note    Patient Details  Name: Bradley Brandt MRN: [id number] Date of Birth: [birth year]  [facility]) CM/SW Contact:    Milinda Antis, LCSWA Phone Number: 08/10/2022, 8:54 AM  Clinical Narrative:                 LCSW contacted central intake for [facility], Altha Harm, and was informed that insurance Josem Kaufmann is still pending.    08:58-  LCSW informed that insurance Josem Kaufmann has been approved.  MD notified.   TOC following.  Expected Discharge Plan: Skilled Nursing Facility Barriers to Discharge: SNF Pending bed offer, Continued Medical Work up   Patient Goals and CMS Choice            Expected Discharge Plan and Services       Living arrangements for the past 2 months: Single Family Home                                      Prior Living Arrangements/Services Living arrangements for the past 2 months: Single Family Home Lives with:: Self Patient language and need for interpreter reviewed:: Yes        Need for Family Participation in Patient Care: Yes (Comment) Care giver support system in place?: Yes (comment)   Criminal Activity/Legal Involvement Pertinent to Current Situation/Hospitalization: No - Comment as needed  Activities of Daily Living Home Assistive Devices/Equipment: Cane (specify quad or straight), Walker (specify type) (straight cane and front wheel walker) ADL Screening (condition at time of admission) Patient's cognitive ability adequate to safely complete daily activities?: Yes Is the patient deaf or have difficulty hearing?: No Does the patient have difficulty seeing, even when wearing glasses/contacts?: No Does the patient have difficulty concentrating, remembering, or making decisions?: No Patient able to express need for assistance with ADLs?: Yes Does the patient have difficulty dressing or bathing?: No Independently performs ADLs?: Yes (appropriate for developmental age) Does  the patient have difficulty walking or climbing stairs?: Yes Weakness of Legs: Both Weakness of Arms/Hands: Both  Permission Sought/Granted                  Emotional Assessment Appearance:: Appears stated age Attitude/Demeanor/Rapport: Lethargic Affect (typically observed): Accepting Orientation: : Oriented to Self, Oriented to Place, Oriented to  Time, Oriented to Situation Alcohol / Substance Use: Not Applicable Psych Involvement: No (comment)  Admission diagnosis:  Rhabdomyolysis [M62.82] Weakness [R53.1] Thrombocytopenia (HCC) [D69.6] Elevated transaminase level [R74.01] Normochromic normocytic anemia [D64.9] Non-traumatic rhabdomyolysis [M62.82] Patient Active Problem List   Diagnosis Date Noted   Acute urinary retention 08/01/2022   Fever 08/01/2022   Rhabdomyolysis 07/30/2022   Multiple sclerosis exacerbation ([place]) 07/12/2022   Marijuana use 11/22/2020   History of marijuana use 11/22/2020   Chronic pain syndrome 11/21/2020   Pharmacologic therapy 11/21/2020   Disorder of skeletal system 11/21/2020   Problems influencing health status 11/21/2020   MS (multiple sclerosis) ([place])    Neuromuscular disorder (HCC)    Bipolar disorder in full remission ([place]) 11/16/2020   Other insomnia 08/11/2020   Generalized anxiety disorder 08/11/2020   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[place]) 12/31/2017   Optic neuritis 08/02/2017   High risk medication use 08/02/2017   Vitamin D deficiency 09/19/2016   Hypercholesterolemia 09/19/2016   Numbness 12/16/2015   Spastic gait 12/16/2015   Urinary hesitancy 12/16/2015   Low back pain with sciatica  09/28/2014   Polysubstance dependence ([place]) 02/09/2014   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 [place] [address] Phone: [phone] Fax: [phone]  [id number] [address] [address] [address] [address]Phone: [phone]: [phone]    Social Determinants of Health (SDOH) Social History: [place] Food Insecurity Present (07/31/2022)  Housing: Low Risk  (07/31/2022)  Transportation Needs: No Transportation Needs (07/31/2022)  Utilities: Not At Risk (07/31/2022)  Depression (PHQ2-9): Low Risk  (10/12/2021)  Recent Concern: Depression (PHQ2-9) - Medium Risk (08/04/2021)  Tobacco Use: High Risk (07/30/2022)   SDOH Interventions:     Readmission Risk Interventions     No data to display

## 2022-08-10 NOTE — Progress Notes (Signed)
[facility] to give report spoke to April.

## 2022-08-11 ENCOUNTER — Telehealth: Payer: Self-pay | Admitting: Neurology

## 2022-08-11 NOTE — Telephone Encounter (Signed)
07/27/2022    JCV stratify antibody is 0.25 negative

## 2022-08-18 ENCOUNTER — Other Ambulatory Visit: Payer: Self-pay | Admitting: Nurse Practitioner

## 2022-08-18 DIAGNOSIS — G894 Chronic pain syndrome: Secondary | ICD-10-CM

## 2022-08-18 DIAGNOSIS — F319 Bipolar disorder, unspecified: Secondary | ICD-10-CM | POA: Diagnosis not present

## 2022-08-18 DIAGNOSIS — G35 Multiple sclerosis: Secondary | ICD-10-CM | POA: Diagnosis not present

## 2022-08-18 DIAGNOSIS — E46 Unspecified protein-calorie malnutrition: Secondary | ICD-10-CM | POA: Diagnosis not present

## 2022-08-18 DIAGNOSIS — F419 Anxiety disorder, unspecified: Secondary | ICD-10-CM | POA: Diagnosis not present

## 2022-08-18 MED ORDER — BACLOFEN 20 MG PO TABS: 20.0000 mg | ORAL_TABLET | Freq: Three times a day (TID) | ORAL | 0 refills | Status: DC

## 2022-08-18 NOTE — Telephone Encounter (Signed)
Please advise KH 

## 2022-08-22 ENCOUNTER — Non-Acute Institutional Stay (HOSPITAL_COMMUNITY)
Admission: RE | Admit: 2022-08-22 | Discharge: 2022-08-22 | Disposition: A | Discharge status: Home / Self Care | Payer: 59 | Source: Ambulatory Visit | Attending: Internal Medicine | Admitting: Internal Medicine

## 2022-08-22 DIAGNOSIS — G35 Multiple sclerosis: Secondary | ICD-10-CM | POA: Insufficient documentation

## 2022-08-22 MED ORDER — SODIUM CHLORIDE 0.9 % IV SOLN
300.0000 mg | INTRAVENOUS | Status: DC
  Administered 2022-08-22: 300 mg via INTRAVENOUS
  Filled 2022-08-22: qty 15

## 2022-08-22 MED ORDER — SODIUM CHLORIDE 0.9 % IV SOLN: INTRAVENOUS | Status: DC | PRN

## 2022-08-22 NOTE — Progress Notes (Signed)
PATIENT CARE CENTER NOTE     Diagnosis:MS (multiple sclerosis) ([place]) [G35]   Provider: Arlice Colt, MD   Procedure: Tysabri infusion   Note: Patient received Tysabri infusion (dose # 4 of 5) via PIV. Patient is now in a wheelchair and is getting rehab at a SNF facility. Patient's caregiver from SNF is with patient. Notified Dr. Garth Bigness office via secure chat and verified if patient can continue to get Tysabri infusion. Patient can proceed with Tysabri infusions per Anderson Malta, RMA. Patient tolerated infusion well with no adverse reaction. Vital signs stable. Discharge instructions given to patient's caregiver.  Patient declined to stay for the 1 hour post infusion observation. Next appointment scheduled. Patient alert, oriented and transfers in wheelchair at discharge.

## 2022-08-24 LAB — CULTURE, FUNGUS WITHOUT SMEAR

## 2022-08-24 NOTE — Progress Notes (Signed)
Clarification of malnutrition diagnosis. Diagnosis is mild protein calorie malnutrition, acute, due to functional impairment related to MS flare.

## 2022-08-28 NOTE — Telephone Encounter (Signed)
Called pt and unable to leave a message. In regard pt prescription request.

## 2022-08-30 ENCOUNTER — Other Ambulatory Visit (HOSPITAL_COMMUNITY): Payer: Self-pay | Admitting: Urology

## 2022-08-30 DIAGNOSIS — R338 Other retention of urine: Secondary | ICD-10-CM

## 2022-08-30 DIAGNOSIS — F419 Anxiety disorder, unspecified: Secondary | ICD-10-CM | POA: Diagnosis not present

## 2022-08-30 DIAGNOSIS — F319 Bipolar disorder, unspecified: Secondary | ICD-10-CM | POA: Diagnosis not present

## 2022-08-30 DIAGNOSIS — G35 Multiple sclerosis: Secondary | ICD-10-CM | POA: Diagnosis not present

## 2022-08-30 DIAGNOSIS — E46 Unspecified protein-calorie malnutrition: Secondary | ICD-10-CM | POA: Diagnosis not present

## 2022-09-01 ENCOUNTER — Other Ambulatory Visit: Payer: Self-pay | Admitting: Nurse Practitioner

## 2022-09-01 DIAGNOSIS — G894 Chronic pain syndrome: Secondary | ICD-10-CM

## 2022-09-01 NOTE — Telephone Encounter (Signed)
Please advise Kh 

## 2022-09-06 ENCOUNTER — Other Ambulatory Visit: Payer: Self-pay | Admitting: Physician Assistant

## 2022-09-06 ENCOUNTER — Other Ambulatory Visit: Payer: Self-pay | Admitting: Student

## 2022-09-06 DIAGNOSIS — Z01818 Encounter for other preprocedural examination: Secondary | ICD-10-CM

## 2022-09-07 ENCOUNTER — Other Ambulatory Visit: Payer: Self-pay

## 2022-09-07 ENCOUNTER — Other Ambulatory Visit (HOSPITAL_COMMUNITY): Payer: Self-pay | Admitting: Internal Medicine

## 2022-09-07 ENCOUNTER — Ambulatory Visit (HOSPITAL_COMMUNITY)
Admission: RE | Admit: 2022-09-07 | Discharge: 2022-09-07 | Disposition: A | Discharge status: Home / Self Care | Payer: 59 | Source: Ambulatory Visit | Attending: Urology | Admitting: Urology

## 2022-09-07 ENCOUNTER — Telehealth: Payer: Self-pay | Admitting: *Deleted

## 2022-09-07 ENCOUNTER — Other Ambulatory Visit (HOSPITAL_COMMUNITY): Payer: Self-pay | Admitting: Urology

## 2022-09-07 ENCOUNTER — Encounter (HOSPITAL_COMMUNITY): Payer: Self-pay

## 2022-09-07 ENCOUNTER — Ambulatory Visit (HOSPITAL_COMMUNITY)
Admission: RE | Admit: 2022-09-07 | Discharge: 2022-09-07 | Disposition: A | Discharge status: Home / Self Care | Payer: 59 | Source: Ambulatory Visit | Attending: Internal Medicine | Admitting: Internal Medicine

## 2022-09-07 DIAGNOSIS — R339 Retention of urine, unspecified: Secondary | ICD-10-CM

## 2022-09-07 DIAGNOSIS — G35 Multiple sclerosis: Secondary | ICD-10-CM | POA: Diagnosis not present

## 2022-09-07 DIAGNOSIS — R338 Other retention of urine: Secondary | ICD-10-CM

## 2022-09-07 DIAGNOSIS — Z01818 Encounter for other preprocedural examination: Secondary | ICD-10-CM

## 2022-09-07 DIAGNOSIS — F319 Bipolar disorder, unspecified: Secondary | ICD-10-CM | POA: Insufficient documentation

## 2022-09-07 HISTORY — PX: IR FLUORO GUIDED NEEDLE PLC ASPIRATION/INJECTION LOC: IMG2395

## 2022-09-07 LAB — CBC
HCT: 37.4 % — ABNORMAL LOW (ref 39.0–52.0)
Hemoglobin: 11.8 g/dL — ABNORMAL LOW (ref 13.0–17.0)
MCH: 30.6 pg (ref 26.0–34.0)
MCHC: 31.6 g/dL (ref 30.0–36.0)
MCV: 97.1 fL (ref 80.0–100.0)
Platelets: 264 10*3/uL (ref 150–400)
RBC: 3.85 MIL/uL — ABNORMAL LOW (ref 4.22–5.81)
RDW: 13.6 % (ref 11.5–15.5)
WBC: 9.4 10*3/uL (ref 4.0–10.5)
nRBC: 1.5 % — ABNORMAL HIGH (ref 0.0–0.2)

## 2022-09-07 LAB — PROTIME-INR
INR: 1 (ref 0.8–1.2)
Prothrombin Time: 13.6 seconds (ref 11.4–15.2)

## 2022-09-07 MED ORDER — FENTANYL CITRATE (PF) 100 MCG/2ML IJ SOLN
INTRAMUSCULAR | Status: AC | PRN
  Administered 2022-09-07 (×2): 50 ug via INTRAVENOUS

## 2022-09-07 MED ORDER — MIDAZOLAM HCL 2 MG/2ML IJ SOLN
INTRAMUSCULAR | Status: AC
  Filled 2022-09-07: qty 2

## 2022-09-07 MED ORDER — MIDAZOLAM HCL 2 MG/2ML IJ SOLN
INTRAMUSCULAR | Status: AC | PRN
  Administered 2022-09-07 (×2): 1 mg via INTRAVENOUS

## 2022-09-07 MED ORDER — FENTANYL CITRATE (PF) 100 MCG/2ML IJ SOLN
INTRAMUSCULAR | Status: AC
  Filled 2022-09-07: qty 2

## 2022-09-07 MED ORDER — LIDOCAINE HCL 1 % IJ SOLN
INTRAMUSCULAR | Status: AC
  Administered 2022-09-07: 10 mL
  Filled 2022-09-07: qty 20

## 2022-09-07 MED ORDER — SODIUM CHLORIDE 0.9 % IV SOLN: INTRAVENOUS | Status: DC

## 2022-09-07 MED ORDER — IOHEXOL 300 MG/ML  SOLN
50.0000 mL | Freq: Once | INTRAMUSCULAR | Status: AC | PRN
  Administered 2022-09-07: 10 mL

## 2022-09-07 NOTE — Procedures (Signed)
Interventional Radiology Procedure Note  Procedure: [place] AND FLUORO 12 FR SP TUBE     Complications: None  Estimated Blood Loss:  MIN  Findings: 12 FR SP TUBE    M. Daryll Brod, MD

## 2022-09-07 NOTE — Sedation Documentation (Signed)
Patients foley catheter removed per MD Shick.  Patient tolerated.  Patient had only roughly 1cc in the balloon and the catheter removed with ease.

## 2022-09-07 NOTE — H&P (Signed)
Chief Complaint: Patient was seen in consultation today for urinary retention  Referring Physician(s): Bell,Eugene D III  Supervising Physician: Daryll Brod  Patient Status: [facility] - Out-pt  History of Present Illness: Bradley Brandt is a 42 y.o. male with PMH significant for bipolar 1 disorder and multiple sclerosis being seen today for urinary retention. The patient is followed by Dr Gloriann Loan from [facility] who has referred the patient to [facility] for image-guided suprapubic catheter placement.   Past Medical History:  Diagnosis Date   Bipolar 1 disorder ([place])    Bipolar disorder, unspecified ([place]) 01/03/2013   MS (multiple sclerosis) ([place]) dx 2006   relapsing-remitting right sided weakness   Neuromuscular disorder ([place])    MS    Past Surgical History:  Procedure Laterality Date   TUMOR REMOVAL  1983   abdomen benign    Allergies: Lithium  Medications: Prior to Admission medications   Medication Sig Start Date End Date Taking? Authorizing Provider  acetaminophen (TYLENOL) 500 MG tablet Take 500 mg by mouth every 8 (eight) hours as needed for moderate pain.   Yes [provider]  baclofen (LIORESAL) 20 MG tablet Take 1 tablet (20 mg total) by mouth 3 (three) times daily. 08/18/22  Yes Paseda, Dewaine Conger, FNP  DULoxetine (CYMBALTA) 30 MG capsule Take 1 capsule (30 mg total) by mouth daily. 07/22/22  Yes Paseda, Dewaine Conger, FNP  gabapentin (NEURONTIN) 400 MG capsule Take 1 capsule (400 mg total) by mouth 3 (three) times daily. 08/10/22  Yes Mapp, Tavien, MD  lidocaine (HM LIDOCAINE PATCH) 4 % Place 1 patch onto the skin daily. 10/12/21  Yes Passmore, Jake Church I, NP  natalizumab (TYSABRI) 300 MG/15ML injection Inject 15 mLs (300 mg total) into the vein every 28 (twenty-eight) days. 09/05/16  Yes Sater, Nanine Means, MD  phenazopyridine (PYRIDIUM) 200 MG tablet Take 1 tablet (200 mg total) by mouth 3 (three) times daily with meals. 08/10/22  Yes Mapp, Tavien,  MD  QUEtiapine (SEROQUEL) 200 MG tablet Take 1 tablet (200 mg total) by mouth at bedtime. Take a half tablet at night for 4 days then increase to a whole tablet each night. 07/22/22  Yes Paseda, Dewaine Conger, FNP  Vitamin D, Ergocalciferol, (DRISDOL) 1.25 MG (50000 UNIT) CAPS capsule Take 1 capsule (50,000 Units total) by mouth every 7 (seven) days. 06/29/22  Yes Sater, Nanine Means, MD  feeding supplement (ENSURE ENLIVE / ENSURE PLUS) LIQD Take 237 mLs by mouth 3 (three) times daily between meals. 08/10/22   Mapp, Claudia Desanctis, MD     Family History  Adopted: Yes  Problem Relation Age of Onset   Lupus Mother    Ataxia Neg Hx    Chorea Neg Hx    Dementia Neg Hx    Mental retardation Neg Hx    Migraines Neg Hx    Multiple sclerosis Neg Hx    Neurofibromatosis Neg Hx    Neuropathy Neg Hx    Parkinsonism Neg Hx    Seizures Neg Hx    Stroke Neg Hx     Social History   Socioeconomic History   Marital status: Single    Spouse name: Not on file   Number of children: Not on file   Years of education: Not on file   Highest education level: Not on file  Occupational History   Not on file  Tobacco Use   Smoking status: Every Day    Packs/day: 1    Types: Cigarettes   Smokeless tobacco:  Never  Vaping Use   Vaping Use: Former  Substance and Sexual Activity   Alcohol use: Not Currently    Alcohol/week: 72.0 standard drinks of alcohol    Types: 10 Glasses of wine, 12 Cans of beer, 50 Shots of liquor per week   Drug use: Not Currently    Frequency: 7.0 times per week    Types: Marijuana    Comment: daily   Sexual activity: Yes    Partners: Female    Birth control/protection: None  Other Topics Concern   Not on file  Social History Narrative   Not on file   Social Determinants of Health   Financial Resource Strain: Not on file  Food Insecurity: Food Insecurity Present (07/31/2022)   Hunger Vital Sign    Worried About Running Out of Food in the Last Year: Often true    Ran Out of Food in  the Last Year: Often true  Transportation Needs: No Transportation Needs (07/31/2022)   PRAPARE - Hydrologist (Medical): No    Lack of Transportation (Non-Medical): No  Physical Activity: Not on file  Stress: Not on file  Social Connections: Not on file    Code Status: Full Code  Review of Systems: A 12 point ROS discussed and pertinent positives are indicated in the HPI above.  All other systems are negative.  Review of Systems  Constitutional:  Negative for chills and fever.  Respiratory:  Negative for chest tightness and shortness of breath.   Cardiovascular:  Negative for chest pain and leg swelling.  Gastrointestinal:  Negative for abdominal pain, diarrhea, nausea and vomiting.  Neurological:  Negative for dizziness and headaches.  Psychiatric/Behavioral:  Negative for confusion.     Vital Signs: BP 124/78 (BP Location: Right Arm)   Pulse 68   Temp (!) 97.1 F (36.2 C)   Resp 16   Ht 5\' 11"  (1.803 m)   Wt 216 lb (98 kg)   SpO2 98%   BMI 30.13 kg/m     Physical Exam Vitals reviewed.  Constitutional:      General: He is not in acute distress.    Appearance: He is not ill-appearing.  HENT:     Mouth/Throat:     Mouth: Mucous membranes are moist.  Cardiovascular:     Rate and Rhythm: Normal rate and regular rhythm.     Pulses: Normal pulses.     Heart sounds: Normal heart sounds.  Pulmonary:     Effort: Pulmonary effort is normal.     Breath sounds: Normal breath sounds.  Abdominal:     General: Bowel sounds are normal.     Palpations: Abdomen is soft.     Tenderness: There is no abdominal tenderness.  Musculoskeletal:     Right lower leg: No edema.     Left lower leg: No edema.  Skin:    General: Skin is warm and dry.  Neurological:     Mental Status: He is alert and oriented to person, place, and time.  Psychiatric:        Mood and Affect: Mood normal.        Behavior: Behavior normal.        Thought Content: Thought  content normal.        Judgment: Judgment normal.     Imaging: No results found.  Labs:  CBC: Recent Labs    08/03/22 0708 08/07/22 0828 08/08/22 0012 08/09/22 0337  WBC 10.0 7.8 9.2 9.3  HGB  13.5 12.4* 12.1* 12.0*  HCT 39.2 36.6* 35.0* 35.5*  PLT 185 280 290 294    COAGS: Recent Labs    07/30/22 0609  INR 1.1    BMP: Recent Labs    08/04/22 0209 08/06/22 0254 08/07/22 0337 08/08/22 0012  NA 137 136 138 135  K 4.2 4.4 4.2 4.0  CL 100 101 103 101  CO2 28 26 28 27   GLUCOSE 122* 109* 101* 95  BUN 16 20 31* 27*  CALCIUM 8.7* 8.6* 8.8* 8.6*  CREATININE 0.85 0.88 0.93 0.85  GFRNONAA >60 >60 >60 >60    LIVER FUNCTION TESTS: Recent Labs    08/04/22 0209 08/06/22 0254 08/07/22 0337 08/08/22 0012  BILITOT 0.1* 0.2* 0.5 0.2*  AST 33 37 36 30  ALT 43 46* 48* 44  ALKPHOS 47 47 46 44  PROT 5.6* 6.5 6.4* 6.7  ALBUMIN 2.2* 2.0* 2.0* 2.0*    TUMOR MARKERS: No results for input(s): "AFPTM", "CEA", "CA199", "CHROMGRNA" in the last 8760 hours.  Assessment and Plan:  Jamill Prochnow is a 42 yo male with bipolar disorder and multiple sclerosis being seen today in relation to urinary retention. Patient has been followed by Dr Gloriann Loan from [facility] who has referred patient to IR for image-guided suprapubic catheter placement. Case is scheduled to proceed on 09/07/22.  Risks and benefits of image-guided suprapubic catheter placement discussed with the patient including bleeding, infection, damage to adjacent structures, bowel perforation/fistula connection, and sepsis.  All of the patient's questions were answered, patient is agreeable to proceed. Consent signed and in chart.   Thank you for this interesting consult.  I greatly enjoyed meeting DERYN ABRAMOWICZ and look forward to participating in their care.  A copy of this report was sent to the requesting provider on this date.  Electronically Signed: Lura Em, PA-C 09/07/2022, 8:43 AM   I spent a  total of 30 Minutes   in face to face in clinical consultation, greater than 50% of which was counseling/coordinating care for urinary retention.
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
## 2022-09-14 ENCOUNTER — Emergency Department (HOSPITAL_COMMUNITY)
Admission: EM | Admit: 2022-09-14 | Discharge: 2022-09-14 | Disposition: A | Discharge status: Skilled Nursing Facility | Payer: 59 | Attending: Emergency Medicine | Admitting: Emergency Medicine

## 2022-09-14 ENCOUNTER — Other Ambulatory Visit: Payer: Self-pay

## 2022-09-14 ENCOUNTER — Emergency Department (HOSPITAL_COMMUNITY): Payer: 59

## 2022-09-14 DIAGNOSIS — R1030 Lower abdominal pain, unspecified: Secondary | ICD-10-CM | POA: Diagnosis not present

## 2022-09-14 DIAGNOSIS — K769 Liver disease, unspecified: Secondary | ICD-10-CM

## 2022-09-14 DIAGNOSIS — M7071 Other bursitis of hip, right hip: Secondary | ICD-10-CM

## 2022-09-14 DIAGNOSIS — R102 Pelvic and perineal pain: Secondary | ICD-10-CM

## 2022-09-14 DIAGNOSIS — N3001 Acute cystitis with hematuria: Secondary | ICD-10-CM

## 2022-09-14 LAB — URINALYSIS, ROUTINE W REFLEX MICROSCOPIC
Bilirubin Urine: NEGATIVE
Glucose, UA: NEGATIVE mg/dL
Ketones, ur: NEGATIVE mg/dL
Nitrite: POSITIVE — AB
Protein, ur: 30 mg/dL — AB
RBC / HPF: >50 RBC/hpf (ref 0–5)
Specific Gravity, Urine: 1.009 (ref 1.005–1.030)
WBC, UA: >50 WBC/hpf (ref 0–5)
pH: 8 (ref 5.0–8.0)

## 2022-09-14 LAB — CBC WITH DIFFERENTIAL/PLATELET
Abs Immature Granulocytes: 0.08 10*3/uL — ABNORMAL HIGH (ref 0.00–0.07)
Basophils Absolute: 0.1 10*3/uL (ref 0.0–0.1)
Basophils Relative: 1 %
Eosinophils Absolute: 0.5 10*3/uL (ref 0.0–0.5)
Eosinophils Relative: 5 %
HCT: 37.4 % — ABNORMAL LOW (ref 39.0–52.0)
Hemoglobin: 11.7 g/dL — ABNORMAL LOW (ref 13.0–17.0)
Immature Granulocytes: 1 %
Lymphocytes Relative: 40 %
Lymphs Abs: 3.6 10*3/uL (ref 0.7–4.0)
MCH: 30.1 pg (ref 26.0–34.0)
MCHC: 31.3 g/dL (ref 30.0–36.0)
MCV: 96.1 fL (ref 80.0–100.0)
Monocytes Absolute: 0.7 10*3/uL (ref 0.1–1.0)
Monocytes Relative: 8 %
Neutro Abs: 4 10*3/uL (ref 1.7–7.7)
Neutrophils Relative %: 45 %
Platelets: 269 10*3/uL (ref 150–400)
RBC: 3.89 MIL/uL — ABNORMAL LOW (ref 4.22–5.81)
RDW: 13.7 % (ref 11.5–15.5)
WBC: 8.9 10*3/uL (ref 4.0–10.5)
nRBC: 0.7 % — ABNORMAL HIGH (ref 0.0–0.2)

## 2022-09-14 LAB — BASIC METABOLIC PANEL
Anion gap: 8 (ref 5–15)
BUN: 11 mg/dL (ref 6–20)
CO2: 30 mmol/L (ref 22–32)
Calcium: 9.4 mg/dL (ref 8.9–10.3)
Chloride: 100 mmol/L (ref 98–111)
Creatinine, Ser: 0.81 mg/dL (ref 0.61–1.24)
GFR, Estimated: >60 mL/min (ref >60)
Glucose, Bld: 92 mg/dL (ref 70–99)
Potassium: 4.3 mmol/L (ref 3.5–5.1)
Sodium: 138 mmol/L (ref 135–145)

## 2022-09-14 MED ORDER — PHENAZOPYRIDINE HCL 100 MG PO TABS
100.0000 mg | ORAL_TABLET | Freq: Once | ORAL | Status: AC
  Administered 2022-09-14: 100 mg via ORAL
  Filled 2022-09-14: qty 1

## 2022-09-14 MED ORDER — OXYCODONE HCL 5 MG PO TABS
5.0000 mg | ORAL_TABLET | Freq: Once | ORAL | Status: AC
  Administered 2022-09-14: 5 mg via ORAL
  Filled 2022-09-14: qty 1

## 2022-09-14 MED ORDER — GABAPENTIN 400 MG PO CAPS
400.0000 mg | ORAL_CAPSULE | Freq: Once | ORAL | Status: AC
  Administered 2022-09-14: 400 mg via ORAL
  Filled 2022-09-14: qty 1

## 2022-09-14 MED ORDER — IOHEXOL 300 MG/ML  SOLN
100.0000 mL | Freq: Once | INTRAMUSCULAR | Status: AC | PRN
  Administered 2022-09-14: 100 mL via INTRAVENOUS

## 2022-09-14 MED ORDER — BACLOFEN 10 MG PO TABS
20.0000 mg | ORAL_TABLET | Freq: Once | ORAL | Status: AC
  Administered 2022-09-14: 20 mg via ORAL
  Filled 2022-09-14: qty 2

## 2022-09-14 MED ORDER — CEFPODOXIME PROXETIL 100 MG PO TABS: 100.0000 mg | ORAL_TABLET | Freq: Two times a day (BID) | ORAL | 0 refills | Status: AC

## 2022-09-14 MED ORDER — OXYBUTYNIN CHLORIDE 5 MG PO TABS
5.0000 mg | ORAL_TABLET | Freq: Once | ORAL | Status: AC
  Administered 2022-09-14: 5 mg via ORAL
  Filled 2022-09-14: qty 1

## 2022-09-14 MED ORDER — PHENAZOPYRIDINE HCL 200 MG PO TABS: 200.0000 mg | ORAL_TABLET | Freq: Three times a day (TID) | ORAL | 0 refills | Status: DC

## 2022-09-14 MED ORDER — SODIUM CHLORIDE 0.9 % IV SOLN
1.0000 g | Freq: Once | INTRAVENOUS | Status: AC
  Administered 2022-09-14: 1 g via INTRAVENOUS
  Filled 2022-09-14: qty 10

## 2022-09-14 MED ORDER — MORPHINE SULFATE (PF) 4 MG/ML IV SOLN
4.0000 mg | Freq: Once | INTRAVENOUS | Status: AC
  Administered 2022-09-14: 4 mg via INTRAVENOUS
  Filled 2022-09-14: qty 1

## 2022-09-14 NOTE — ED Notes (Signed)
RN called [facility] to have patient transported back to [facility]

## 2022-09-14 NOTE — ED Notes (Signed)
RN called [facility] and gave report to April, RN

## 2022-09-14 NOTE — Discharge Instructions (Signed)
You have been seen in the Emergency Department (ED) today and your workup today suggests that you have a a urinary tract infection (UTI).  You have been prescribed an antibiotic called cefpodoxime. Take this medication as prescribed and do not miss any doses. You may use over-the-counter pain medication (Tylenol or Motrin) as needed, but no more than recommended on the label instructions.  Drink PLENTY of fluids.  Additionally, you will need follow-up with your primary care provider in the outpatient setting for findings of a liver lesion on your CT scan which will need outpatient maintenance and follow-up.  Call your regular doctor to schedule the next available appointment (ideally within one week) to follow up on today's ED visit. Return immediately to the ED if your pain worsens, you develop back pain or you have decreased urine production, develop persistent fever >100.4, persistent vomiting such that you can't hold down your antibiotics, or other symptoms that concern you.

## 2022-09-14 NOTE — ED Provider Notes (Signed)
[place] EMERGENCY DEPARTMENT AT [facility] Provider Note   CSN: [id number] Arrival date & time: 09/14/22  [id number]     History  Chief Complaint  Patient presents with   SP Catheter    Leakage     Bradley Brandt is a 42 y.o. male. With pmh bipolar 1 disorder and multiple sclerosis s/p IR placed suprapubic catheter on 09/07/22 presenting with suprapubic pain and discomfort.  Patient complaining of pain in his abdomen around his suprapubic catheter site since placement on the 21st.  He denied pain prior to this.  He was also complaining of pain from his penis however on exam was denying any pain from his penis.  He denies any drainage from his catheter site where drainage from his penis.  Denies any sexual intercourse or recent sexual partners or injuries.  He has had no fevers, no chills, no vomiting.  He has chronic pain in his right leg.  He was given oxycodone at his facility this morning with some improvement.  HPI     Home Medications Prior to Admission medications   Medication Sig Start Date End Date Taking? Authorizing Provider  acetaminophen (TYLENOL) 500 MG tablet Take 500 mg by mouth every 8 (eight) hours as needed for moderate pain.    [provider]  baclofen (LIORESAL) 20 MG tablet Take 1 tablet (20 mg total) by mouth 3 (three) times daily. 08/18/22   Renee Rival, FNP  DULoxetine (CYMBALTA) 30 MG capsule Take 1 capsule (30 mg total) by mouth daily. 07/22/22   Renee Rival, FNP  feeding supplement (ENSURE ENLIVE / ENSURE PLUS) LIQD Take 237 mLs by mouth 3 (three) times daily between meals. 08/10/22   Mapp, Claudia Desanctis, MD  gabapentin (NEURONTIN) 400 MG capsule Take 1 capsule (400 mg total) by mouth 3 (three) times daily. 08/10/22   Mapp, Claudia Desanctis, MD  lidocaine (HM LIDOCAINE PATCH) 4 % Place 1 patch onto the skin daily. 10/12/21   Passmore, Jake Church I, NP  natalizumab (TYSABRI) 300 MG/15ML injection Inject 15 mLs (300 mg total) into the vein every 28  (twenty-eight) days. 09/05/16   Sater, Nanine Means, MD  phenazopyridine (PYRIDIUM) 200 MG tablet Take 1 tablet (200 mg total) by mouth 3 (three) times daily with meals. 08/10/22   Mapp, Claudia Desanctis, MD  QUEtiapine (SEROQUEL) 200 MG tablet Take 1 tablet (200 mg total) by mouth at bedtime. Take a half tablet at night for 4 days then increase to a whole tablet each night. 07/22/22   Paseda, Dewaine Conger, FNP  Vitamin D, Ergocalciferol, (DRISDOL) 1.25 MG (50000 UNIT) CAPS capsule Take 1 capsule (50,000 Units total) by mouth every 7 (seven) days. 06/29/22   Sater, Nanine Means, MD      Allergies    Lithium    Review of Systems   Review of Systems  Physical Exam Updated Vital Signs BP (!) 108/96 (BP Location: Left Arm)   Pulse 97   Temp 97.7 F (36.5 C) (Oral)   Resp 18   Ht 5\' 11"  (1.803 m)   Wt 98 kg   SpO2 99%   BMI 30.13 kg/m  Physical Exam Constitutional: Alert and oriented.  Chronically ill-appearing but nontoxic Eyes: Conjunctivae are normal. ENT      Head: Normocephalic and atraumatic. Cardiovascular: S1, S2, equal palpable radial and PT pulses warm well-perfused Respiratory: Normal respiratory effort.  O2 sat 99 on RA Gastrointestinal: Soft and nontender.  No CVA tenderness.  Suprapubic catheter in place with minimal surrounding  erythema, no streaking, no warmth, no purulent discharge.  Yellow clear urine draining into catheter bag. Musculoskeletal:  No pitting edema of lower extremities, no erythema, warmth or traumatic deformity to bilateral lower extremities Neurologic: Normal speech and language.  Able to lift bilateral lower extremities against gravity.  Able to lift bilateral upper extremities against gravity.  Sensation grossly intact. Skin: Skin is warm, dry  Psychiatric: Mood and affect are normal. Speech and behavior are normal. ED Results / Procedures / Treatments   Labs (all labs ordered are listed, but only abnormal results are displayed) Labs Reviewed  URINALYSIS, ROUTINE W  REFLEX MICROSCOPIC    EKG None  Radiology No results found.  Procedures Procedures  Remain on constant cardiac monitoring sinus rhythm with normal rates.  Medications Ordered in ED Medications - No data to display  ED Course/ Medical Decision Making/ A&P                             Medical Decision Making  Bradley Brandt is a 42 y.o. male. With pmh bipolar 1 disorder and multiple sclerosis s/p IR placed suprapubic catheter on 09/07/22 presenting with suprapubic pain and discomfort.  On exam, patient suprapubic catheter is in place with no surrounding erythema, warmth or purulent discharge.  No findings concerning for superimposed cellulitis.  It is actively draining and bedside bladder scan showed 68 cc postvoid residual, appears to be working correctly there is no evidence of retention.  Obtained CTAP with contrast to further rule out complications of recent suprapubic catheter placement including hematoma, perforation, fistulization among multiple other possibilities.  UA obtained from bag so could be a dirty sample however does have moderate hemoglobin, protein, nitrite, large leukocyte Estrace and greater than 50 RBCs and WBCs with few bacteria and no squames.  With associated suprapubic pain and discomfort and recent instrumentation will treat as UTI and send for culture.  Given IV dose of Rocephin here.  Normal white blood cell count 8.9 and normal hemodynamics, no concern for sepsis.  Steady anemia hemoglobin 11.7.  Creatinine 0.81 within normal limits and no acute electrolyte abnormalities.  CTAP with contrast obtained which I personally reviewed no evidence of hydronephrosis on my personal review.  Read by radiology as decompressed bladder with properly placed catheter.  Intrarenal stones, no hydronephrosis or obstructing stones.  Incidental findings of liver lesion and hip bursitis.  Did discuss these findings with patient.  He is hemodynamically stable.  Safe to be  discharged with follow-up outpatient, discharged on Pyridium and cefpodoxime.  Return precautions discussed.  Discharged in good condition.   Amount and/or Complexity of Data Reviewed Labs: ordered. Radiology: ordered.  Risk Prescription drug management.    Final Clinical Impression(s) / ED Diagnoses Final diagnoses:  None    Rx / DC Orders ED Discharge Orders     None         Elgie Congo, MD 09/14/22 1309

## 2022-09-14 NOTE — ED Triage Notes (Signed)
Patient brought in by EMS from [facility]) with c/o SP catheter leaking. Patient has SP catheter placed 09/07/22. He c/o stabbing pain to lower ABD area and dull pain in R leg 10/10. Facility gave patient Oxy @0915  prior to transport.

## 2022-09-16 LAB — URINE CULTURE: Culture: >=100000 — AB

## 2022-09-17 NOTE — Progress Notes (Addendum)
ED Antimicrobial Stewardship Positive Culture Follow Up   Bradley Brandt is an 42 y.o. male who presented to [facility] on 09/14/2022 with a chief complaint of  Chief Complaint  Patient presents with   SP Catheter    Leakage     Recent Results (from the past 720 hour(s))  Urine Culture (for pregnant, neutropenic or urologic patients or patients with an indwelling urinary catheter)     Status: Abnormal   Collection Time: 09/14/22  9:44 AM   Specimen: Urine, Catheterized  Result Value Ref Range Status   Specimen Description   Final    URINE, CATHETERIZED Performed at [facility], [place] [address], [place], [place] [address]    Special Requests   Final    NONE Performed at [facility], [place] [address] [place], [place] [address]    Culture >=100,000 COLONIES/mL ENTEROCOCCUS FAECALIS (A)  Final   Report Status 09/16/2022 FINAL  Final   Organism ID, Bacteria ENTEROCOCCUS FAECALIS (A)  Final      Susceptibility   Enterococcus faecalis - MIC*    AMPICILLIN <=2 SENSITIVE Sensitive     NITROFURANTOIN <=16 SENSITIVE Sensitive     VANCOMYCIN 1 SENSITIVE Sensitive     * >=100,000 COLONIES/mL ENTEROCOCCUS FAECALIS    [x]  Treated with cefpodoxime, organism resistant to prescribed antimicrobial []  Patient discharged originally without antimicrobial agent and treatment is now indicated  New antibiotic prescription: Ampicillin 500mg  q6h x 7 days.   STOP cefpodoxime  ED Provider: Dr Arville Go PharmD, BCPS [facility] main pharmacy [phone] 09/17/2022 12:54 PM

## 2022-09-18 ENCOUNTER — Telehealth (HOSPITAL_BASED_OUTPATIENT_CLINIC_OR_DEPARTMENT_OTHER): Payer: Self-pay

## 2022-09-18 DIAGNOSIS — E46 Unspecified protein-calorie malnutrition: Secondary | ICD-10-CM | POA: Diagnosis not present

## 2022-09-18 DIAGNOSIS — F319 Bipolar disorder, unspecified: Secondary | ICD-10-CM | POA: Diagnosis not present

## 2022-09-18 DIAGNOSIS — G35 Multiple sclerosis: Secondary | ICD-10-CM | POA: Diagnosis not present

## 2022-09-18 DIAGNOSIS — F419 Anxiety disorder, unspecified: Secondary | ICD-10-CM | POA: Diagnosis not present

## 2022-09-18 NOTE — Telephone Encounter (Signed)
Post ED Visit - Positive Culture Follow-up: Successful Patient Follow-Up  Culture assessed and recommendations reviewed by:  [x]  Gretta Arab, Pharm.D. []  Heide Guile, Pharm.D., BCPS AQ-ID []  Parks Neptune, Pharm.D., BCPS []  Alycia Rossetti, Pharm.D., BCPS []  [place], Pharm.D., BCPS, AAHIVP []  Legrand Como, Pharm.D., BCPS, AAHIVP []  Salome Arnt, PharmD, BCPS []  Johnnette Gourd, PharmD, BCPS []  Hughes Better, PharmD, BCPS []  Leeroy Cha, PharmD  Positive urine culture  []  Patient discharged without antimicrobial prescription and treatment is now indicated [x]  Organism is resistant to prescribed ED discharge antimicrobial []  Patient with positive blood cultures  Changes discussed with ED provider: Regan Lemming, MD New antibiotic prescription Ampicillin 500 mg po QID x 7 days Faxed to [facility] at Attention to April Bryant at [place], date 09/18/2022, time 11:00 am   Glennon Hamilton 09/18/2022, 11:08 AM

## 2022-09-19 ENCOUNTER — Non-Acute Institutional Stay (HOSPITAL_COMMUNITY)
Admission: RE | Admit: 2022-09-19 | Discharge: 2022-09-19 | Disposition: A | Discharge status: Home / Self Care | Payer: 59 | Source: Ambulatory Visit | Attending: Internal Medicine | Admitting: Internal Medicine

## 2022-09-19 DIAGNOSIS — G35 Multiple sclerosis: Secondary | ICD-10-CM | POA: Insufficient documentation

## 2022-09-19 MED ORDER — SODIUM CHLORIDE 0.9 % IV SOLN: INTRAVENOUS | Status: DC | PRN

## 2022-09-19 MED ORDER — SODIUM CHLORIDE 0.9 % IV SOLN
300.0000 mg | INTRAVENOUS | Status: DC
  Administered 2022-09-19: 300 mg via INTRAVENOUS
  Filled 2022-09-19: qty 15

## 2022-09-19 NOTE — Progress Notes (Signed)
PATIENT CARE CENTER NOTE     Diagnosis:MS (multiple sclerosis) ([place]) [G35]   Provider: Arlice Colt, MD   Procedure: Tysabri infusion   Note: Patient received Tysabri infusion (dose # 5 of 5) via PIV. Patient is now in a wheelchair and is getting rehab at a SNF facility.  Patient tolerated infusion well with no adverse reaction. Vital signs stable. Discharge instructions given to patient.  Patient declined to stay for the 1 hour post infusion observation. Notified patient's transportation from SNF. Next appointment scheduled for 10/17/22. Patient alert, oriented and transfers in wheelchair at discharge.

## 2022-09-21 ENCOUNTER — Other Ambulatory Visit: Payer: Self-pay | Admitting: *Deleted

## 2022-09-21 DIAGNOSIS — G35 Multiple sclerosis: Secondary | ICD-10-CM

## 2022-09-25 NOTE — Telephone Encounter (Signed)
Faxed updated form below to [employer]. Received fax confirmation.

## 2022-09-27 ENCOUNTER — Telehealth: Payer: Self-pay | Admitting: *Deleted

## 2022-09-27 NOTE — Telephone Encounter (Addendum)
Received email from April Keene/[place] Revenue Integrity/Denial Coordinator (phone: [phone], fax: [phone]) that pt Tysabri on 08/22/22 was denied stating no auth on file via [employer]. I replied advising we had not received a request to complete PA and did not have his current [employer] on file. She confirmed they are buy/bill facility only and provided his updated insurance card below to complete PA:     Pt receives infusion at: [facility]) [address], [place], [place] [address] Phone: [phone] Fax: [phone] J code for infusion: [id number] Touch ID site#: [id number] NPI: [id number] Dx code: [place] provider services at [phone]. Spoke w/ Candise Bowens. She transferred me to [facility] department. Spoke w/ Ron A. States specialty pharmacy team has to handle request. They are closed and will open 9am EST. Phone: [phone]. I will call back this department once they open.  0930: Called [phone]. Spoke w/ Sharlynn Oliphant. States PA can be completed via website: [employer] or by fax. Found form at: [email or web address]. Fax: [phone]. Form printed, working on [place].  Pt has been on Tysabri since around 2016. Has tried/failed: Avonex, Rebif.

## 2022-09-28 NOTE — Telephone Encounter (Signed)
Faxed urgent PA to [employer] at [phone]. Received fax confirmation, waiting on determination.

## 2022-10-04 DIAGNOSIS — M25571 Pain in right ankle and joints of right foot: Secondary | ICD-10-CM | POA: Diagnosis not present

## 2022-10-04 DIAGNOSIS — R112 Nausea with vomiting, unspecified: Secondary | ICD-10-CM | POA: Diagnosis not present

## 2022-10-04 DIAGNOSIS — M25561 Pain in right knee: Secondary | ICD-10-CM | POA: Diagnosis not present

## 2022-10-04 NOTE — Telephone Encounter (Addendum)
Received notification from [employer] that provider/vendor both out of network now with new insurance. I reached out to [employer] to let her know and they verified they can send complimentary dose of Tysabri for pt upcoming infusion on 10/17/22. They will send dose on 10/10/22 for delivery on 10/11/22. They will send to [facility] pharmacy at address: [address] [place] [id number].   I emailed update to April Keene/[place] to let her know what was going on as well.   LVM for pt at [phone] to call office.

## 2022-10-10 ENCOUNTER — Emergency Department (HOSPITAL_COMMUNITY): Payer: 59

## 2022-10-10 ENCOUNTER — Emergency Department (HOSPITAL_COMMUNITY)
Admission: EM | Admit: 2022-10-10 | Discharge: 2022-10-10 | Disposition: A | Discharge status: Home / Self Care | Payer: 59 | Attending: Emergency Medicine | Admitting: Emergency Medicine

## 2022-10-10 ENCOUNTER — Other Ambulatory Visit: Payer: Self-pay

## 2022-10-10 ENCOUNTER — Encounter (HOSPITAL_COMMUNITY): Payer: Self-pay

## 2022-10-10 DIAGNOSIS — G35 Multiple sclerosis: Secondary | ICD-10-CM | POA: Insufficient documentation

## 2022-10-10 DIAGNOSIS — M25571 Pain in right ankle and joints of right foot: Secondary | ICD-10-CM | POA: Diagnosis not present

## 2022-10-10 DIAGNOSIS — Y92003 Bedroom of unspecified non-institutional (private) residence as the place of occurrence of the external cause: Secondary | ICD-10-CM | POA: Insufficient documentation

## 2022-10-10 DIAGNOSIS — W06XXXA Fall from bed, initial encounter: Secondary | ICD-10-CM | POA: Diagnosis not present

## 2022-10-10 DIAGNOSIS — M25551 Pain in right hip: Secondary | ICD-10-CM | POA: Insufficient documentation

## 2022-10-10 DIAGNOSIS — W19XXXA Unspecified fall, initial encounter: Secondary | ICD-10-CM

## 2022-10-10 NOTE — Discharge Instructions (Signed)
You were seen today after a fall.  Your x-rays do not show any sign of fracture.  Follow-up with your primary doctor.

## 2022-10-10 NOTE — ED Triage Notes (Signed)
Per patient report he fell out of bed and laid on floor for two until facility staff found him. Patient told EMS he hit his head when he fell. Patient denied any changes in vision or LOC. Patient c/o right hip pain. Patient has hx of MS

## 2022-10-10 NOTE — ED Provider Notes (Signed)
[place] EMERGENCY DEPARTMENT AT [facility] Provider Note   CSN: [id number] Arrival date & time: 10/10/22  0038     History  Chief Complaint  Patient presents with   Bradley Brandt is a 42 y.o. male.  HPI     This is a 42 year old male with history of MS who presents following a fall.  Reports he fell out of bed onto his right hip.  He states he was on the floor for several hours before he was found by facility staff.  Reports hitting his head but without loss of consciousness.  Reporting right hip pain and right ankle pain.  Denies chest pain, shortness of breath.  Not on any blood thinners.  Home Medications Prior to Admission medications   Medication Sig Start Date End Date Taking? Authorizing Provider  acetaminophen (TYLENOL) 500 MG tablet Take 500 mg by mouth every 8 (eight) hours as needed for moderate pain.    [provider]  baclofen (LIORESAL) 20 MG tablet Take 1 tablet (20 mg total) by mouth 3 (three) times daily. 08/18/22   Donell Beers, FNP  DULoxetine (CYMBALTA) 30 MG capsule Take 1 capsule (30 mg total) by mouth daily. 07/22/22   Donell Beers, FNP  feeding supplement (ENSURE ENLIVE / ENSURE PLUS) LIQD Take 237 mLs by mouth 3 (three) times daily between meals. 08/10/22   Mapp, Gaylyn Cheers, MD  gabapentin (NEURONTIN) 400 MG capsule Take 1 capsule (400 mg total) by mouth 3 (three) times daily. 08/10/22   Mapp, Gaylyn Cheers, MD  lidocaine (HM LIDOCAINE PATCH) 4 % Place 1 patch onto the skin daily. 10/12/21   Passmore, Enid Derry I, NP  natalizumab (TYSABRI) 300 MG/15ML injection Inject 15 mLs (300 mg total) into the vein every 28 (twenty-eight) days. 09/05/16   Sater, Pearletha Furl, MD  phenazopyridine (PYRIDIUM) 200 MG tablet Take 1 tablet (200 mg total) by mouth 3 (three) times daily. 09/14/22   Mardene Sayer, MD  QUEtiapine (SEROQUEL) 200 MG tablet Take 1 tablet (200 mg total) by mouth at bedtime. Take a half tablet at night for 4 days then  increase to a whole tablet each night. 07/22/22   Paseda, Palermo Kay, FNP  Vitamin D, Ergocalciferol, (DRISDOL) 1.25 MG (50000 UNIT) CAPS capsule Take 1 capsule (50,000 Units total) by mouth every 7 (seven) days. 06/29/22   Sater, Pearletha Furl, MD      Allergies    Lithium    Review of Systems   Review of Systems  Musculoskeletal:        Hip pain, ankle pain  All other systems reviewed and are negative.   Physical Exam Updated Vital Signs BP (!) 118/92 (BP Location: Right Arm)   Pulse (!) 59   Temp 98.5 F (36.9 C) (Oral)   Resp 16   Ht 1.803 m ( )   Wt 83.9 kg   SpO2 96%   BMI 25.80 kg/m  Physical Exam Vitals and nursing note reviewed.  Constitutional:      Appearance: He is well-developed.     Comments: Chronically ill-appearing, no acute distress  HENT:     Head: Normocephalic and atraumatic.     Mouth/Throat:     Mouth: Mucous membranes are moist.  Eyes:     Pupils: Pupils are equal, round, and reactive to light.  Cardiovascular:     Rate and Rhythm: Normal rate and regular rhythm.     Heart sounds: Normal heart sounds. No murmur heard.  Pulmonary:     Effort: Pulmonary effort is normal. No respiratory distress.     Breath sounds: Normal breath sounds. No wheezing.  Abdominal:     Palpations: Abdomen is soft.     Tenderness: There is no abdominal tenderness.  Musculoskeletal:     Cervical back: Neck supple.     Comments: Tenderness palpation right hip with rotation noted, patient laying on left hip, full evaluation difficult as patient appears generally contractured and has difficulty extending his extremities at baseline  Lymphadenopathy:     Cervical: No cervical adenopathy.  Skin:    General: Skin is warm and dry.  Neurological:     Mental Status: He is alert and oriented to person, place, and time.  Psychiatric:        Mood and Affect: Mood normal.     ED Results / Procedures / Treatments   Labs (all labs ordered are listed, but only abnormal  results are displayed) Labs Reviewed - No data to display  EKG None  Radiology CT Head Wo Contrast  Result Date: 10/10/2022 CLINICAL DATA:  Status post fall. EXAM: CT HEAD WITHOUT CONTRAST TECHNIQUE: Contiguous axial images were obtained from the base of the skull through the vertex without intravenous contrast. RADIATION DOSE REDUCTION: This exam was performed according to the departmental dose-optimization program which includes automated exposure control, adjustment of the mA and/or kV according to patient size and/or use of iterative reconstruction technique. COMPARISON:  May 08, 2010 FINDINGS: Brain: There is mild cerebral atrophy with widening of the extra-axial spaces and ventricular dilatation. There are areas of decreased attenuation within the white matter tracts of the supratentorial brain, consistent with microvascular disease changes. Vascular: No hyperdense vessel or unexpected calcification. Skull: Normal. Negative for fracture or focal lesion. Sinuses/Orbits: There is marked severity right maxillary sinus and right ethmoid sinus mucosal thickening. Other: None. IMPRESSION: 1. No acute intracranial abnormality. 2. 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 3. Right maxillary sinus and right ethmoid sinus disease. Electronically Signed   By: Aram Candela M.D.   On: 10/10/2022 02:22   DG Ankle Complete Right  Result Date: 10/10/2022 CLINICAL DATA:  Fall EXAM: RIGHT ANKLE - COMPLETE 3+ VIEW COMPARISON:  None Available. FINDINGS: There is no evidence of fracture, dislocation, or joint effusion. There is no evidence of arthropathy or other focal bone abnormality. Soft tissues are unremarkable. IMPRESSION: Negative. Electronically Signed   By: Charlett Nose M.D.   On: 10/10/2022 01:49   DG Hip Unilat W or Wo Pelvis 2-3 Views Right  Result Date: 10/10/2022 CLINICAL DATA:  Fall EXAM: DG HIP (WITH OR WITHOUT PELVIS) 2-3V RIGHT COMPARISON:  None  Available. FINDINGS: There is no evidence of hip fracture or dislocation. There is no evidence of arthropathy or other focal bone abnormality. Pelvic drainage catheter in place in the midline. IMPRESSION: No acute bony abnormality. Electronically Signed   By: Charlett Nose M.D.   On: 10/10/2022 01:49    Procedures Procedures    Medications Ordered in ED Medications - No data to display  ED Course/ Medical Decision Making/ A&P                             Medical Decision Making Amount and/or Complexity of Data Reviewed Radiology: ordered.   This patient presents to the ED for concern of right hip pain, this involves an extensive number of treatment options, and is a complaint that  carries with it a high risk of complications and morbidity.  I considered the following differential and admission for this acute, potentially life threatening condition.  The differential diagnosis includes hip fracture, dislocation, contusion, other injury such as head injury  MDM:    This is a 42 year old male with a history of MS who fell out of bed.  Complaining mostly of right hip pain.  Exam is limited because of lower extremity contractures and baseline neurologic exam.  He does appear to have tenderness in the right hip.  X-rays of the pelvis independently reviewed and show no signs of fracture.  Right ankle films negative as well as well as CT head.  Patient is nonambulatory and uses a wheelchair at baseline.  (Labs, imaging, consults)  Labs: I Ordered, and personally interpreted labs.  The pertinent results include: None  Imaging Studies ordered: I ordered imaging studies including right hip, right ankle, CT head I independently visualized and interpreted imaging. I agree with the radiologist interpretation  Additional history obtained from EMS.  External records from outside source obtained and reviewed including prior evaluations  Cardiac Monitoring: The patient was not maintained on a  cardiac monitor.  If on the cardiac monitor, I personally viewed and interpreted the cardiac monitored which showed an underlying rhythm of: N/A  Reevaluation: After the interventions noted above, I reevaluated the patient and found that they have :stayed the same  Social Determinants of Health:  MS, disability  Disposition: Discharge  Co morbidities that complicate the patient evaluation  Past Medical History:  Diagnosis Date   Bipolar 1 disorder    Bipolar disorder, unspecified 01/03/2013   MS (multiple sclerosis) dx 2006   relapsing-remitting right sided weakness   Neuromuscular disorder    MS     Medicines No orders of the defined types were placed in this encounter.   I have reviewed the patients home medicines and have made adjustments as needed  Problem List / ED Course: Problem List Items Addressed This Visit   None Visit Diagnoses     Fall, initial encounter    -  Primary   Right hip pain                       Final Clinical Impression(s) / ED Diagnoses Final diagnoses:  Fall, initial encounter  Right hip pain    Rx / DC Orders ED Discharge Orders     None         Gary Gabrielsen, Mayer Masker, MD 10/10/22 [id number]

## 2022-10-10 NOTE — ED Notes (Signed)
[facility] contacted for transfer back to facility

## 2022-10-10 NOTE — ED Notes (Signed)
Unable to reach facility via phone

## 2022-10-11 DIAGNOSIS — R112 Nausea with vomiting, unspecified: Secondary | ICD-10-CM | POA: Diagnosis not present

## 2022-10-11 DIAGNOSIS — M25561 Pain in right knee: Secondary | ICD-10-CM | POA: Diagnosis not present

## 2022-10-11 DIAGNOSIS — M25571 Pain in right ankle and joints of right foot: Secondary | ICD-10-CM | POA: Diagnosis not present

## 2022-10-17 ENCOUNTER — Non-Acute Institutional Stay (HOSPITAL_COMMUNITY)
Admission: RE | Admit: 2022-10-17 | Discharge: 2022-10-17 | Disposition: A | Discharge status: Home / Self Care | Payer: 59 | Source: Ambulatory Visit | Attending: Internal Medicine | Admitting: Internal Medicine

## 2022-10-17 DIAGNOSIS — G35D Multiple sclerosis, unspecified: Secondary | ICD-10-CM

## 2022-10-17 DIAGNOSIS — G35 Multiple sclerosis: Secondary | ICD-10-CM | POA: Diagnosis not present

## 2022-10-17 MED ORDER — SODIUM CHLORIDE 0.9 % IV SOLN: INTRAVENOUS | Status: DC | PRN

## 2022-10-17 MED ORDER — SODIUM CHLORIDE 0.9 % IV SOLN
300.0000 mg | INTRAVENOUS | Status: DC
  Administered 2022-10-17: 300 mg via INTRAVENOUS
  Filled 2022-10-17: qty 15

## 2022-10-17 NOTE — Telephone Encounter (Addendum)
[facility] messaged this am: "Bradley, Brandt received his Tysabri, after his infusion we noticed his arm was puffy around the insertion site, no complaints of pain, we didn't notice it at the halfway VS, so it may have infiltrated towards the end. We removed the IV, and gave him a heat pack. Anything else we should do? He did not want to stay so he left, the swelling had reduced and he said it felt fine. We can call him if theres anything else you advise"   I made Dr. Epimenio Foot aware of above. No further action needed per Dr. Epimenio Foot at this time.   They will try and call pt back to let him know he will need to find alternate infusion site moving forward and neurologist since we have been unable to reach him. They will have him call our office if any further questions.He will need to call his new insurance.  Contact info they have for pt:  mobile is [phone] and his home number is [phone].

## 2022-10-17 NOTE — Telephone Encounter (Signed)
Update from [facility]: "Ok I got through to him and his caregiver and passed on the message. He stays in a facility at the moment. They will have the social worker help him call the insurance and look for a new neuro provider and infusion site. "

## 2022-10-17 NOTE — Progress Notes (Signed)
PATIENT CARE CENTER NOTE:   Diagnosis: Multiple Sclerosis ( HCC) [G35]      Provider: Despina Arias, MD     Procedure: Donnamarie Poag 300mg  infusion     Note: Patient received Tysabri infusion ( dose #1 of 1, complimentary dose ) via PIV. No premedications required per orders. Tolerated infusion well with no adverse reaction. Vital signs stable. After infusion completed, swelling noted around IV insertion site, pt reports no pain. IV removed and heat pack placed over area, Emma RN nurse to Dr. Epimenio Foot notified, no further intervention advised, provider made aware.  Discharge instructions given. Patient declined to stay for the 1 hour post-infusion observation. Swelling had subsided at time of discharge, pt states he has no pain around the area. Alert, oriented and taken in personal wheelchair by caregiver at discharge.  After pt discharged, received notification from [facility] that pt will no longer receive Tysabri infusions at the [facility] or be followed by Dr Epimenio Foot due to both centers being out of network with pt's new insurance. Pt notified via phone call with caregiver present that pt will need to call insurance and establish with a new Neuro MD and infusion center so as not to delay further treatments, verbalized understanding states they will have the SW of [facility] where the pt resides to assist with finding new providers.

## 2022-10-18 DIAGNOSIS — F419 Anxiety disorder, unspecified: Secondary | ICD-10-CM | POA: Diagnosis not present

## 2022-10-18 DIAGNOSIS — E46 Unspecified protein-calorie malnutrition: Secondary | ICD-10-CM | POA: Diagnosis not present

## 2022-10-18 DIAGNOSIS — F319 Bipolar disorder, unspecified: Secondary | ICD-10-CM | POA: Diagnosis not present

## 2022-10-18 DIAGNOSIS — G35 Multiple sclerosis: Secondary | ICD-10-CM | POA: Diagnosis not present

## 2022-10-26 ENCOUNTER — Telehealth: Payer: Self-pay | Admitting: Neurology

## 2022-10-26 NOTE — Telephone Encounter (Signed)
Called pt's father and he stated that pt is not doing well at all. Pt's father states that every time he would go see pt he would be slumped over in the wheelchair. Pt's father stated that pt was supposed to get PT at the facility he was staying at and he wasn't getting any PT. Pt's father states that pt can't stand nor walk. Pt's father states that pt had a fall in April that he had to go to the hospital for. Pt was recently discharged from the facility he was staying at and is back home with his parents. Pt's father states that he wants pt to have at Home PT. Told pt's father Dr. Epimenio Foot will be notified about this matter and we will be back in contact with him.

## 2022-10-26 NOTE — Telephone Encounter (Signed)
Pt's father is asking for a call to discuss the needed referral for pt's Home Healthcare needs

## 2022-10-30 ENCOUNTER — Other Ambulatory Visit: Payer: Self-pay

## 2022-10-30 ENCOUNTER — Inpatient Hospital Stay (HOSPITAL_COMMUNITY)
Admission: EM | Admit: 2022-10-30 | Discharge: 2022-11-04 | DRG: 699 | Disposition: A | Discharge status: Skilled Nursing Facility | Payer: 59 | Attending: Internal Medicine | Admitting: Internal Medicine

## 2022-10-30 ENCOUNTER — Encounter (HOSPITAL_COMMUNITY): Payer: Self-pay

## 2022-10-30 DIAGNOSIS — N390 Urinary tract infection, site not specified: Secondary | ICD-10-CM | POA: Diagnosis not present

## 2022-10-30 DIAGNOSIS — T83090A Other mechanical complication of cystostomy catheter, initial encounter: Secondary | ICD-10-CM | POA: Diagnosis not present

## 2022-10-30 DIAGNOSIS — G8191 Hemiplegia, unspecified affecting right dominant side: Secondary | ICD-10-CM | POA: Diagnosis present

## 2022-10-30 DIAGNOSIS — Z87440 Personal history of urinary (tract) infections: Secondary | ICD-10-CM

## 2022-10-30 DIAGNOSIS — T83511A Infection and inflammatory reaction due to indwelling urethral catheter, initial encounter: Secondary | ICD-10-CM

## 2022-10-30 DIAGNOSIS — T83011A Breakdown (mechanical) of indwelling urethral catheter, initial encounter: Secondary | ICD-10-CM | POA: Diagnosis not present

## 2022-10-30 DIAGNOSIS — M549 Dorsalgia, unspecified: Secondary | ICD-10-CM | POA: Diagnosis present

## 2022-10-30 DIAGNOSIS — D649 Anemia, unspecified: Secondary | ICD-10-CM | POA: Diagnosis present

## 2022-10-30 DIAGNOSIS — G35 Multiple sclerosis: Secondary | ICD-10-CM | POA: Diagnosis not present

## 2022-10-30 DIAGNOSIS — Z79899 Other long term (current) drug therapy: Secondary | ICD-10-CM

## 2022-10-30 DIAGNOSIS — T83518A Infection and inflammatory reaction due to other urinary catheter, initial encounter: Secondary | ICD-10-CM | POA: Diagnosis present

## 2022-10-30 DIAGNOSIS — Z888 Allergy status to other drugs, medicaments and biological substances status: Secondary | ICD-10-CM

## 2022-10-30 DIAGNOSIS — F17210 Nicotine dependence, cigarettes, uncomplicated: Secondary | ICD-10-CM | POA: Diagnosis present

## 2022-10-30 DIAGNOSIS — Y738 Miscellaneous gastroenterology and urology devices associated with adverse incidents, not elsewhere classified: Secondary | ICD-10-CM | POA: Diagnosis present

## 2022-10-30 DIAGNOSIS — Z7401 Bed confinement status: Secondary | ICD-10-CM

## 2022-10-30 DIAGNOSIS — Z6825 Body mass index (BMI) 25.0-25.9, adult: Secondary | ICD-10-CM

## 2022-10-30 DIAGNOSIS — F319 Bipolar disorder, unspecified: Secondary | ICD-10-CM | POA: Insufficient documentation

## 2022-10-30 LAB — CREATININE, SERUM
Creatinine, Ser: 0.89 mg/dL (ref 0.61–1.24)
GFR, Estimated: >60 mL/min (ref >60)

## 2022-10-30 LAB — BASIC METABOLIC PANEL
Anion gap: 8 (ref 5–15)
BUN: 8 mg/dL (ref 6–20)
CO2: 28 mmol/L (ref 22–32)
Calcium: 9.7 mg/dL (ref 8.9–10.3)
Chloride: 104 mmol/L (ref 98–111)
Creatinine, Ser: 0.81 mg/dL (ref 0.61–1.24)
GFR, Estimated: >60 mL/min (ref >60)
Glucose, Bld: 98 mg/dL (ref 70–99)
Potassium: 3.9 mmol/L (ref 3.5–5.1)
Sodium: 140 mmol/L (ref 135–145)

## 2022-10-30 LAB — CBC WITH DIFFERENTIAL/PLATELET
Abs Immature Granulocytes: 0.06 10*3/uL (ref 0.00–0.07)
Basophils Absolute: 0 10*3/uL (ref 0.0–0.1)
Basophils Relative: 1 %
Eosinophils Absolute: 0.3 10*3/uL (ref 0.0–0.5)
Eosinophils Relative: 3 %
HCT: 41.2 % (ref 39.0–52.0)
Hemoglobin: 13.3 g/dL (ref 13.0–17.0)
Immature Granulocytes: 1 %
Lymphocytes Relative: 39 %
Lymphs Abs: 3.4 10*3/uL (ref 0.7–4.0)
MCH: 29.4 pg (ref 26.0–34.0)
MCHC: 32.3 g/dL (ref 30.0–36.0)
MCV: 90.9 fL (ref 80.0–100.0)
Monocytes Absolute: 0.6 10*3/uL (ref 0.1–1.0)
Monocytes Relative: 7 %
Neutro Abs: 4.4 10*3/uL (ref 1.7–7.7)
Neutrophils Relative %: 49 %
Platelets: 371 10*3/uL (ref 150–400)
RBC: 4.53 MIL/uL (ref 4.22–5.81)
RDW: 14.2 % (ref 11.5–15.5)
WBC: 8.7 10*3/uL (ref 4.0–10.5)
nRBC: 1.1 % — ABNORMAL HIGH (ref 0.0–0.2)

## 2022-10-30 LAB — URINALYSIS, ROUTINE W REFLEX MICROSCOPIC
Bilirubin Urine: NEGATIVE
Glucose, UA: NEGATIVE mg/dL
Ketones, ur: NEGATIVE mg/dL
Nitrite: POSITIVE — AB
Protein, ur: 100 mg/dL — AB
RBC / HPF: >50 RBC/hpf (ref 0–5)
Specific Gravity, Urine: 1.01 (ref 1.005–1.030)
WBC, UA: >50 WBC/hpf (ref 0–5)
pH: 8 (ref 5.0–8.0)

## 2022-10-30 LAB — GLUCOSE, CAPILLARY: Glucose-Capillary: 129 mg/dL — ABNORMAL HIGH (ref 70–99)

## 2022-10-30 LAB — CBC
HCT: 38.3 % — ABNORMAL LOW (ref 39.0–52.0)
Hemoglobin: 12.2 g/dL — ABNORMAL LOW (ref 13.0–17.0)
MCH: 28.8 pg (ref 26.0–34.0)
MCHC: 31.9 g/dL (ref 30.0–36.0)
MCV: 90.5 fL (ref 80.0–100.0)
Platelets: 336 10*3/uL (ref 150–400)
RBC: 4.23 MIL/uL (ref 4.22–5.81)
RDW: 14.2 % (ref 11.5–15.5)
WBC: 10 10*3/uL (ref 4.0–10.5)
nRBC: 1 % — ABNORMAL HIGH (ref 0.0–0.2)

## 2022-10-30 MED ORDER — VITAMIN D (ERGOCALCIFEROL) 1.25 MG (50000 UNIT) PO CAPS
50000.0000 [IU] | ORAL_CAPSULE | ORAL | Status: DC
  Administered 2022-11-01: 50000 [IU] via ORAL
  Filled 2022-10-30: qty 1

## 2022-10-30 MED ORDER — QUETIAPINE FUMARATE 100 MG PO TABS
200.0000 mg | ORAL_TABLET | Freq: Every day | ORAL | Status: DC
  Administered 2022-10-30 – 2022-11-03 (×5): 200 mg via ORAL
  Filled 2022-10-30 (×5): qty 2

## 2022-10-30 MED ORDER — ACETAMINOPHEN 325 MG PO TABS
650.0000 mg | ORAL_TABLET | Freq: Four times a day (QID) | ORAL | Status: DC | PRN
  Filled 2022-10-30: qty 2

## 2022-10-30 MED ORDER — ENSURE ENLIVE PO LIQD
237.0000 mL | Freq: Three times a day (TID) | ORAL | Status: DC
  Administered 2022-10-30 – 2022-11-04 (×9): 237 mL via ORAL

## 2022-10-30 MED ORDER — TRAZODONE HCL 50 MG PO TABS: 25.0000 mg | ORAL_TABLET | Freq: Every evening | ORAL | Status: DC | PRN

## 2022-10-30 MED ORDER — ENOXAPARIN SODIUM 40 MG/0.4ML IJ SOSY
40.0000 mg | PREFILLED_SYRINGE | INTRAMUSCULAR | Status: DC
  Administered 2022-10-30: 40 mg via SUBCUTANEOUS
  Filled 2022-10-30: qty 0.4

## 2022-10-30 MED ORDER — PHENAZOPYRIDINE HCL 100 MG PO TABS
200.0000 mg | ORAL_TABLET | Freq: Three times a day (TID) | ORAL | Status: AC
  Administered 2022-10-30 – 2022-11-03 (×13): 200 mg via ORAL
  Filled 2022-10-30 (×13): qty 2

## 2022-10-30 MED ORDER — DULOXETINE HCL 30 MG PO CPEP
30.0000 mg | ORAL_CAPSULE | Freq: Every day | ORAL | Status: DC
  Administered 2022-10-30 – 2022-11-04 (×6): 30 mg via ORAL
  Filled 2022-10-30 (×6): qty 1

## 2022-10-30 MED ORDER — ONDANSETRON HCL 4 MG/2ML IJ SOLN: 4.0000 mg | Freq: Four times a day (QID) | INTRAMUSCULAR | Status: DC | PRN

## 2022-10-30 MED ORDER — SODIUM CHLORIDE 0.9 % IV SOLN
1.0000 g | Freq: Once | INTRAVENOUS | Status: AC
  Administered 2022-10-30: 1 g via INTRAVENOUS
  Filled 2022-10-30: qty 10

## 2022-10-30 MED ORDER — GABAPENTIN 300 MG PO CAPS
400.0000 mg | ORAL_CAPSULE | Freq: Three times a day (TID) | ORAL | Status: DC
  Administered 2022-10-30 – 2022-11-04 (×14): 400 mg via ORAL
  Filled 2022-10-30 (×14): qty 1

## 2022-10-30 MED ORDER — LIDOCAINE 5 % EX PTCH
1.0000 | MEDICATED_PATCH | CUTANEOUS | Status: DC
  Administered 2022-10-30 – 2022-11-03 (×5): 1 via TRANSDERMAL
  Filled 2022-10-30 (×5): qty 1

## 2022-10-30 MED ORDER — BACLOFEN 10 MG PO TABS
20.0000 mg | ORAL_TABLET | Freq: Three times a day (TID) | ORAL | Status: DC
  Administered 2022-10-30 – 2022-11-04 (×14): 20 mg via ORAL
  Filled 2022-10-30 (×14): qty 2

## 2022-10-30 MED ORDER — ONDANSETRON HCL 4 MG PO TABS: 4.0000 mg | ORAL_TABLET | Freq: Four times a day (QID) | ORAL | Status: DC | PRN

## 2022-10-30 MED ORDER — NITROFURANTOIN MONOHYD MACRO 100 MG PO CAPS
100.0000 mg | ORAL_CAPSULE | Freq: Once | ORAL | Status: DC
  Filled 2022-10-30: qty 1

## 2022-10-30 MED ORDER — MAGNESIUM HYDROXIDE 400 MG/5ML PO SUSP: 30.0000 mL | Freq: Every day | ORAL | Status: DC | PRN

## 2022-10-30 MED ORDER — ACETAMINOPHEN 650 MG RE SUPP: 650.0000 mg | Freq: Four times a day (QID) | RECTAL | Status: DC | PRN

## 2022-10-30 NOTE — Assessment & Plan Note (Signed)
-   We will continue his Seroquel.

## 2022-10-30 NOTE — H&P (Addendum)
[place]   PATIENT NAME: Bradley Brandt    MR#:  [id number]  DATE OF BIRTH:  August 16, 1980  DATE OF ADMISSION:  10/30/2022  PRIMARY CARE PHYSICIAN: Quentin Angst, MD   Patient is coming from: Home  REQUESTING/REFERRING PHYSICIAN: Charlynne Pander, MD   CHIEF COMPLAINT:   Chief Complaint  Patient presents with   Needs Foley Changed   Abdominal Pain    HISTORY OF PRESENT ILLNESS:  Bradley Brandt is a 42 y.o. African-American male with medical history significant for bipolar 1 disorder, relapsing-remitting right-sided weakness with multiple sclerosis, who presented to the emergency room with acute onset of suprapubic catheter malfunction.  He noted just that there was leaking around it and therefore he has been urinating through his penis instead of the catheter.  He admitted to dysuria, urinary frequency and urgency.  It was recently put on 3/21 by IR and has not been exchanged.  He has a history of recurrent UTIs.  He lives with his sister.  No nausea or vomiting but he has been having lower abdominal discomfort.  No chest pain or palpitations.  No dyspnea or cough or wheezing.  No fever but he has been having mild chills.  ED Course: When he came to the ER, vital signs were within normal except for respiratory rate of 22..Urine was positive for UTI and CBC showed no significant abnormalities. EKG as reviewed by me : EKG showed sinus rhythm with a rate of 80  with early repolarization   The patient was given 1 g of IV Rocephin.  Contact was made with Dr. Loreta Ave with IR.  Plan will be to exchange his suprapubic catheter in a.m.  PAST MEDICAL HISTORY:   Past Medical History:  Diagnosis Date   Bipolar 1 disorder (HCC)    Bipolar disorder, unspecified (HCC) 01/03/2013   MS (multiple sclerosis) (HCC) dx 2006   relapsing-remitting right sided weakness   Neuromuscular disorder (HCC)    MS    PAST SURGICAL HISTORY:   Past Surgical History:  Procedure Laterality  Date   IR FLUORO GUIDED NEEDLE PLC ASPIRATION/INJECTION LOC  09/07/2022   TUMOR REMOVAL  1983   abdomen benign    SOCIAL HISTORY:   Social History   Tobacco Use   Smoking status: Every Day    Packs/day: 1    Types: Cigarettes   Smokeless tobacco: Never  Substance Use Topics   Alcohol use: Not Currently    Alcohol/week: 72.0 standard drinks of alcohol    Types: 10 Glasses of wine, 12 Cans of beer, 50 Shots of liquor per week    FAMILY HISTORY:   Family History  Adopted: Yes  Problem Relation Age of Onset   Lupus Mother    Ataxia Neg Hx    Chorea Neg Hx    Dementia Neg Hx    Mental retardation Neg Hx    Migraines Neg Hx    Multiple sclerosis Neg Hx    Neurofibromatosis Neg Hx    Neuropathy Neg Hx    Parkinsonism Neg Hx    Seizures Neg Hx    Stroke Neg Hx     DRUG ALLERGIES:   Allergies  Allergen Reactions   Lithium Rash    REVIEW OF SYSTEMS:   ROS As per history of present illness. All pertinent systems were reviewed above. Constitutional, HEENT, cardiovascular, respiratory, GI, GU, musculoskeletal, neuro, psychiatric, endocrine, integumentary and hematologic systems were reviewed and are otherwise negative/unremarkable except for positive  findings mentioned above in the HPI.   MEDICATIONS AT HOME:   Prior to Admission medications   Medication Sig Start Date End Date Taking? Authorizing Provider  acetaminophen (TYLENOL) 500 MG tablet Take 500 mg by mouth every 8 (eight) hours as needed for moderate pain.   Yes [provider]  baclofen (LIORESAL) 20 MG tablet Take 1 tablet (20 mg total) by mouth 3 (three) times daily. 08/18/22  Yes Paseda, Marzella Kay, FNP  DULoxetine (CYMBALTA) 30 MG capsule Take 1 capsule (30 mg total) by mouth daily. 07/22/22  Yes Paseda, Mankowski Kay, FNP  feeding supplement (ENSURE ENLIVE / ENSURE PLUS) LIQD Take 237 mLs by mouth 3 (three) times daily between meals. 08/10/22  Yes Mapp, Tavien, MD  gabapentin (NEURONTIN) 400 MG capsule  Take 1 capsule (400 mg total) by mouth 3 (three) times daily. 08/10/22  Yes Mapp, Tavien, MD  lidocaine (HM LIDOCAINE PATCH) 4 % Place 1 patch onto the skin daily. 10/12/21  Yes Passmore, Enid Derry I, NP  natalizumab (TYSABRI) 300 MG/15ML injection Inject 15 mLs (300 mg total) into the vein every 28 (twenty-eight) days. 09/05/16  Yes Sater, Pearletha Furl, MD  phenazopyridine (PYRIDIUM) 200 MG tablet Take 1 tablet (200 mg total) by mouth 3 (three) times daily. 09/14/22  Yes Mardene Sayer, MD  QUEtiapine (SEROQUEL) 200 MG tablet Take 1 tablet (200 mg total) by mouth at bedtime. Take a half tablet at night for 4 days then increase to a whole tablet each night. 07/22/22  Yes Paseda, Santilli Kay, FNP  Vitamin D, Ergocalciferol, (DRISDOL) 1.25 MG (50000 UNIT) CAPS capsule Take 1 capsule (50,000 Units total) by mouth every 7 (seven) days. 06/29/22  Yes Sater, Pearletha Furl, MD      VITAL SIGNS:  Blood pressure 139/76, pulse 87, temperature 99 F (37.2 C), temperature source Oral, resp. rate 18, height 5\' 11"  (1.803 m), weight 84.4 kg, SpO2 98 %.  PHYSICAL EXAMINATION:  Physical Exam  GENERAL:  42 y.o.-year-old African-American male patient lying in the bed with no acute distress.  EYES: Pupils equal, round, reactive to light and accommodation. No scleral icterus. Extraocular muscles intact.  HEENT: Head atraumatic, normocephalic. Oropharynx and nasopharynx clear.  NECK:  Supple, no jugular venous distention. No thyroid enlargement, no tenderness.  LUNGS: Normal breath sounds bilaterally, no wheezing, rales,rhonchi or crepitation. No use of accessory muscles of respiration.  CARDIOVASCULAR: Regular rate and rhythm, S1, S2 normal. No murmurs, rubs, or gallops.  ABDOMEN: Soft, nondistended, with suprapubic tenderness without rebound tenderness guarding or rigidity.  Bowel sounds present. No organomegaly or mass.  Suprapubic catheter in place. EXTREMITIES: No pedal edema, cyanosis, or clubbing.  NEUROLOGIC: Cranial  nerves II through XII are intact. Muscle strength 5/5 in all extremities. Sensation intact. Gait not checked.  PSYCHIATRIC: The patient is alert and oriented x 3.  Normal affect and good eye contact. SKIN: No obvious rash, lesion, or ulcer.   LABORATORY PANEL:   CBC Recent Labs  Lab 10/30/22 2012  WBC 10.0  HGB 12.2*  HCT 38.3*  PLT 336   ------------------------------------------------------------------------------------------------------------------  Chemistries  Recent Labs  Lab 10/30/22 1645 10/30/22 2012  NA 140  --   K 3.9  --   CL 104  --   CO2 28  --   GLUCOSE 98  --   BUN 8  --   CREATININE 0.81 0.89  CALCIUM 9.7  --    ------------------------------------------------------------------------------------------------------------------  Cardiac Enzymes No results for input(s): "TROPONINI" in the last 168 hours. ------------------------------------------------------------------------------------------------------------------  RADIOLOGY:  No results found.    IMPRESSION AND PLAN:  Assessment and Plan: * UTI (urinary tract infection) - This is associated with suprapubic catheter malfunction. - The patient will be admitted to a an observation medical bed. - We will continue antibiotic therapy with IV Rocephin. - We will place him on hydration with IV normal saline. - Will keep him n.p.o. after midnight. - IR consult will be obtained. - Dr. Loreta Ave was notified by the ED physician.  Relapsing remitting multiple sclerosis (HCC) - We will continue his baclofen and Neurontin.  Bipolar 1 disorder (HCC) - We will continue his Seroquel.    DVT prophylaxis: Lovenox.  Advanced Care Planning:  Code Status: full code.  Family Communication:  The plan of care was discussed in details with the patient (and family). I answered all questions. The patient agreed to proceed with the above mentioned plan. Further management will depend upon hospital course. Disposition  Plan: Back to previous home environment Consults called: IR consult. All the records are reviewed and case discussed with ED provider.  Status is: Observation  I certify that at the time of admission, it is my clinical judgment that the patient will require hospital care extending less than 2 midnights.                            Dispo: The patient is from: Home              Anticipated d/c is to: Home              Patient currently is not medically stable to d/c.              Difficult to place patient: No  Hannah Beat M.D on 10/30/2022 at 10:44 PM  [facility]   From 7 PM-7 AM, contact night-coverage [email or web address]  CC: Primary care physician; Quentin Angst, MD

## 2022-10-30 NOTE — ED Notes (Addendum)
ED TO INPATIENT HANDOFF REPORT  ED Nurse Name and Phone #: Purcell Mouton [phone]  S Name/Age/Gender Bradley Brandt 42 y.o. male Room/Bed: 007C/007C  Code Status   Code Status: Full Code  Home/SNF/Other Home Patient oriented to: self, place, time, and situation Is this baseline? Yes   Triage Complete: Triage complete  Chief Complaint UTI (urinary tract infection) [N39.0]  Triage Note Patient here for foley change due to it leaking. Unsure when it was changed last   Allergies Allergies  Allergen Reactions   Lithium Rash    Level of Care/Admitting Diagnosis ED Disposition     ED Disposition  Admit   Condition  --   Comment  Hospital Area: [facility] [[id number]]  Level of Care: Med-Surg [16]  May place patient in observation at [facility] or [facility] if equivalent level of care is available:: No  Covid Evaluation: Asymptomatic - no recent exposure (last 10 days) testing not required  Diagnosis: UTI (urinary tract infection) [454098]  Admitting Physician: Hannah Beat [[id number]]  Attending Physician: Hannah Beat [[id number]]          B Medical/Surgery History Past Medical History:  Diagnosis Date   Bipolar 1 disorder (HCC)    Bipolar disorder, unspecified (HCC) 01/03/2013   MS (multiple sclerosis) (HCC) dx 2006   relapsing-remitting right sided weakness   Neuromuscular disorder (HCC)    MS   Past Surgical History:  Procedure Laterality Date   IR FLUORO GUIDED NEEDLE PLC ASPIRATION/INJECTION LOC  09/07/2022   TUMOR REMOVAL  1983   abdomen benign     A IV Location/Drains/Wounds Patient Lines/Drains/Airways Status     Active Line/Drains/Airways     Name Placement date Placement time Site Days   Peripheral IV 10/30/22 20 G Right Antecubital 10/30/22  1913  Antecubital  less than 1   Suprapubic Catheter Non-latex 12 Fr. 09/07/22  1022  Non-latex  53   Pressure Injury 08/06/22 Thigh Anterior;Left 08/06/22  0949  -- 85             Intake/Output Last 24 hours  Intake/Output Summary (Last 24 hours) at 10/30/2022 2016 Last data filed at 10/30/2022 2005 Gross per 24 hour  Intake 100.64 ml  Output --  Net 100.64 ml    Labs/Imaging Results for orders placed or performed during the hospital encounter of 10/30/22 (from the past 48 hour(s))  Urinalysis, Routine w reflex microscopic -Urine, Catheterized     Status: Abnormal   Collection Time: 10/30/22  3:47 PM  Result Value Ref Range   Color, Urine AMBER (A) YELLOW    Comment: BIOCHEMICALS MAY BE AFFECTED BY COLOR   APPearance TURBID (A) CLEAR   Specific Gravity, Urine 1.010 1.005 - 1.030   pH 8.0 5.0 - 8.0   Glucose, UA NEGATIVE NEGATIVE mg/dL   Hgb urine dipstick MODERATE (A) NEGATIVE   Bilirubin Urine NEGATIVE NEGATIVE   Ketones, ur NEGATIVE NEGATIVE mg/dL   Protein, ur 086 (A) NEGATIVE mg/dL   Nitrite POSITIVE (A) NEGATIVE   Leukocytes,Ua LARGE (A) NEGATIVE   RBC / HPF >50 0 - 5 RBC/hpf   WBC, UA >50 0 - 5 WBC/hpf   Bacteria, UA MANY (A) NONE SEEN   Squamous Epithelial / HPF 0-5 0 - 5 /HPF   Mucus PRESENT     Comment: Performed at [facility], [address], [place], [place] [address]  CBC with Differential     Status: Abnormal   Collection Time: 10/30/22  4:45 PM  Result Value Ref Range   WBC 8.7 4.0 - 10.5 K/uL   RBC 4.53 4.22 - 5.81 MIL/uL   Hemoglobin 13.3 13.0 - 17.0 g/dL   HCT 82.9 56.2 - 13.0 %   MCV 90.9 80.0 - 100.0 fL   MCH 29.4 26.0 - 34.0 pg   MCHC 32.3 30.0 - 36.0 g/dL   RDW 86.5 78.4 - 69.6 %   Platelets 371 150 - 400 K/uL   nRBC 1.1 (H) 0.0 - 0.2 %   Neutrophils Relative % 49 %   Neutro Abs 4.4 1.7 - 7.7 K/uL   Lymphocytes Relative 39 %   Lymphs Abs 3.4 0.7 - 4.0 K/uL   Monocytes Relative 7 %   Monocytes Absolute 0.6 0.1 - 1.0 K/uL   Eosinophils Relative 3 %   Eosinophils Absolute 0.3 0.0 - 0.5 K/uL   Basophils Relative 1 %   Basophils Absolute 0.0 0.0 - 0.1 K/uL   Immature Granulocytes 1 %   Abs Immature  Granulocytes 0.06 0.00 - 0.07 K/uL    Comment: Performed at [facility], [address] [place], [place] [address]  Basic metabolic panel     Status: None   Collection Time: 10/30/22  4:45 PM  Result Value Ref Range   Sodium 140 135 - 145 mmol/L   Potassium 3.9 3.5 - 5.1 mmol/L   Chloride 104 98 - 111 mmol/L   CO2 28 22 - 32 mmol/L   Glucose, Bld 98 70 - 99 mg/dL    Comment: Glucose reference range applies only to samples taken after fasting for at least 8 hours.   BUN 8 6 - 20 mg/dL   Creatinine, Ser 4.13 0.61 - 1.24 mg/dL   Calcium 9.7 8.9 - 24.4 mg/dL   GFR, Estimated >01 >02 mL/min    Comment: (NOTE) Calculated using the CKD-EPI Creatinine Equation (2021)    Anion gap 8 5 - 15    Comment: Performed at [facility], [address], [place], [place] [address]   No results found.  Pending Labs Unresulted Labs (From admission, onward)     Start     Ordered   11/06/22 0500  Creatinine, serum  (enoxaparin (LOVENOX)    CrCl >/= 30 ml/min)  Weekly,   R     Comments: while on enoxaparin therapy    10/30/22 1946   10/31/22 0500  Basic metabolic panel  Tomorrow morning,   R        10/30/22 1946   10/31/22 0500  CBC  Tomorrow morning,   R        10/30/22 1946   10/30/22 1944  CBC  (enoxaparin (LOVENOX)    CrCl >/= 30 ml/min)  Once,   R       Comments: Baseline for enoxaparin therapy IF NOT ALREADY DRAWN.  Notify MD if PLT < 100 K.    10/30/22 1946   10/30/22 1944  Creatinine, serum  (enoxaparin (LOVENOX)    CrCl >/= 30 ml/min)  Once,   R       Comments: Baseline for enoxaparin therapy IF NOT ALREADY DRAWN.    10/30/22 1946            Vitals/Pain Today's Vitals   10/30/22 1633 10/30/22 1634  BP: 129/86   Pulse: 86   Resp: 16   Temp: 98.8 F (37.1 C)   TempSrc: Oral   SpO2: 97%   Weight:  84.4 kg  Height:  5\' 11"  (1.803 m)  PainSc: 10-Worst pain ever     Isolation Precautions No active isolations  Medications Medications  enoxaparin (LOVENOX)  injection 40 mg (40 mg Subcutaneous Given 10/30/22 2006)  acetaminophen (TYLENOL) tablet 650 mg (has no administration in time range)    Or  acetaminophen (TYLENOL) suppository 650 mg (has no administration in time range)  traZODone (DESYREL) tablet 25 mg (has no administration in time range)  magnesium hydroxide (MILK OF MAGNESIA) suspension 30 mL (has no administration in time range)  ondansetron (ZOFRAN) tablet 4 mg (has no administration in time range)    Or  ondansetron (ZOFRAN) injection 4 mg (has no administration in time range)  cefTRIAXone (ROCEPHIN) 1 g in sodium chloride 0.9 % 100 mL IVPB (0 g Intravenous Stopped 10/30/22 2005)    Mobility walks     Focused Assessments Cardiac Assessment Handoff:    Lab Results  Component Value Date   CKTOTAL 1,482 (H) 08/01/2022   No results found for: "DDIMER" Does the Patient currently have chest pain? No   , Neuro Assessment Handoff:  Swallow screen pass? Yes          Neuro Assessment:   Neuro Checks:      Has TPA been given? No If patient is a Neuro Trauma and patient is going to OR before floor call report to 4N Charge nurse: [phone] or [phone]  , Pulmonary Assessment Handoff:  Lung sounds:   O2 Device: Room Air      R Recommendations: See Admitting Provider Note  Report given to:   Additional Notes: Pt came in due to his suprapubic catheter leaking. Pt has received rocephin so far and has a 20 G in the R AC.

## 2022-10-30 NOTE — ED Notes (Addendum)
Patient's sister to be contacted prior to discharge at mobile [phone]. Sitting in car to pick up patient at discharge.

## 2022-10-30 NOTE — ED Triage Notes (Signed)
Patient here for foley change due to it leaking. Unsure when it was changed last

## 2022-10-30 NOTE — Assessment & Plan Note (Signed)
-   We will continue his baclofen and Neurontin.

## 2022-10-30 NOTE — Assessment & Plan Note (Addendum)
-   This is associated with suprapubic catheter malfunction. - The patient will be admitted to a an observation medical bed. - We will continue antibiotic therapy with IV Rocephin. - We will place him on hydration with IV normal saline. - Will keep him n.p.o. after midnight. - IR consult will be obtained. - Dr. Loreta Ave was notified by the ED physician.

## 2022-10-30 NOTE — Telephone Encounter (Signed)
Called pt's father to get the number to pt's insurance so that we can put the order in for pt to get PT at home.

## 2022-10-30 NOTE — ED Provider Notes (Signed)
[facility] AT [facility] Provider Note   CSN: [id number] Arrival date & time: 10/30/22  1547     History  Chief Complaint  Patient presents with   Needs Foley Changed   Abdominal Pain    AERO STANISLAW is a 42 y.o. male history of multiple sclerosis, bedbound, suprapubic catheter, here presenting with issue with suprapubic catheter.  Patient lives at home with his sister.  Per sister, he has been having issues with the catheter.  He has been leaking around it and he has been urinating through his penis instead of going through the catheter.  Catheter was put on March 21 by interventional radiology.  Patient never had it exchanged.  Patient has multiple urinary tract infections since then.  The history is provided by the patient.       Home Medications Prior to Admission medications   Medication Sig Start Date End Date Taking? Authorizing Provider  acetaminophen (TYLENOL) 500 MG tablet Take 500 mg by mouth every 8 (eight) hours as needed for moderate pain.    [provider]  baclofen (LIORESAL) 20 MG tablet Take 1 tablet (20 mg total) by mouth 3 (three) times daily. 08/18/22   Donell Beers, FNP  DULoxetine (CYMBALTA) 30 MG capsule Take 1 capsule (30 mg total) by mouth daily. 07/22/22   Donell Beers, FNP  feeding supplement (ENSURE ENLIVE / ENSURE PLUS) LIQD Take 237 mLs by mouth 3 (three) times daily between meals. 08/10/22   Mapp, Gaylyn Cheers, MD  gabapentin (NEURONTIN) 400 MG capsule Take 1 capsule (400 mg total) by mouth 3 (three) times daily. 08/10/22   Mapp, Gaylyn Cheers, MD  lidocaine (HM LIDOCAINE PATCH) 4 % Place 1 patch onto the skin daily. 10/12/21   Passmore, Enid Derry I, NP  natalizumab (TYSABRI) 300 MG/15ML injection Inject 15 mLs (300 mg total) into the vein every 28 (twenty-eight) days. 09/05/16   Sater, Pearletha Furl, MD  phenazopyridine (PYRIDIUM) 200 MG tablet Take 1 tablet (200 mg total) by mouth 3 (three) times daily. 09/14/22    Mardene Sayer, MD  QUEtiapine (SEROQUEL) 200 MG tablet Take 1 tablet (200 mg total) by mouth at bedtime. Take a half tablet at night for 4 days then increase to a whole tablet each night. 07/22/22   Paseda, Buczynski Kay, FNP  Vitamin D, Ergocalciferol, (DRISDOL) 1.25 MG (50000 UNIT) CAPS capsule Take 1 capsule (50,000 Units total) by mouth every 7 (seven) days. 06/29/22   Sater, Pearletha Furl, MD      Allergies    Lithium    Review of Systems   Review of Systems  Gastrointestinal:  Positive for abdominal pain.  All other systems reviewed and are negative.   Physical Exam Updated Vital Signs BP 129/86 (BP Location: Right Arm)   Pulse 86   Temp 98.8 F (37.1 C) (Oral)   Resp 16   Ht 5\' 11"  (1.803 m)   Wt 84.4 kg   SpO2 97%   BMI 25.94 kg/m  Physical Exam Vitals and nursing note reviewed.  Constitutional:      Comments: Chronically ill and bedbound  HENT:     Head: Normocephalic.     Mouth/Throat:     Pharynx: Oropharynx is clear.  Eyes:     Extraocular Movements: Extraocular movements intact.     Pupils: Pupils are equal, round, and reactive to light.  Cardiovascular:     Rate and Rhythm: Normal rate and regular rhythm.     Heart sounds:  Normal heart sounds.  Pulmonary:     Effort: Pulmonary effort is normal.     Breath sounds: Normal breath sounds.  Abdominal:     General: Abdomen is flat.     Comments: Suprapubic catheter that is encrusted.  He has a 12 French catheter in place  Skin:    General: Skin is warm.     Capillary Refill: Capillary refill takes less than 2 seconds.  Neurological:     General: No focal deficit present.  Psychiatric:        Mood and Affect: Mood normal.        Behavior: Behavior normal.     ED Results / Procedures / Treatments   Labs (all labs ordered are listed, but only abnormal results are displayed) Labs Reviewed  CBC WITH DIFFERENTIAL/PLATELET - Abnormal; Notable for the following components:      Result Value   nRBC 1.1 (*)     All other components within normal limits  URINALYSIS, ROUTINE W REFLEX MICROSCOPIC - Abnormal; Notable for the following components:   Color, Urine AMBER (*)    APPearance TURBID (*)    Hgb urine dipstick MODERATE (*)    Protein, ur 100 (*)    Nitrite POSITIVE (*)    Leukocytes,Ua LARGE (*)    Bacteria, UA MANY (*)    All other components within normal limits  BASIC METABOLIC PANEL    EKG None  Radiology No results found.  Procedures Procedures    Medications Ordered in ED Medications  cefTRIAXone (ROCEPHIN) 1 g in sodium chloride 0.9 % 100 mL IVPB (has no administration in time range)    ED Course/ Medical Decision Making/ A&P                             Medical Decision Making ANDREW BAYUK is a 42 y.o. male here presenting with issue with suprapubic catheter.  Is leaking and also gets recurrent infections.  It was placed by interventional radiology on March 21.  I discussed case with Dr. Loreta Ave from interventional radiology.  He states that patient will need a catheter exchange and may need a larger size catheter.  He recommend admission for antibiotics and IR will perform catheter exchange tomorrow.  I also discussed the option with family regarding taking him home and coming later to get a catheter exchange but they state that he is bedbound from multiple sclerosis and they are unable to get him transportation.    Problems Addressed: Urinary tract infection associated with catheterization of urinary tract, unspecified indwelling urinary catheter type, initial encounter [facility]): acute illness or injury  Amount and/or Complexity of Data Reviewed Labs: ordered. Decision-making details documented in ED Course.    Final Clinical Impression(s) / ED Diagnoses Final diagnoses:  None    Rx / DC Orders ED Discharge Orders     None         Charlynne Pander, MD 10/30/22 1943

## 2022-10-31 ENCOUNTER — Observation Stay (HOSPITAL_COMMUNITY): Payer: 59

## 2022-10-31 DIAGNOSIS — G35 Multiple sclerosis: Secondary | ICD-10-CM | POA: Diagnosis not present

## 2022-10-31 DIAGNOSIS — T83510A Infection and inflammatory reaction due to cystostomy catheter, initial encounter: Secondary | ICD-10-CM

## 2022-10-31 DIAGNOSIS — F319 Bipolar disorder, unspecified: Secondary | ICD-10-CM | POA: Diagnosis not present

## 2022-10-31 DIAGNOSIS — E663 Overweight: Secondary | ICD-10-CM | POA: Diagnosis not present

## 2022-10-31 HISTORY — PX: IR CATHETER TUBE CHANGE: IMG717

## 2022-10-31 LAB — BASIC METABOLIC PANEL
Anion gap: 11 (ref 5–15)
BUN: 13 mg/dL (ref 6–20)
CO2: 24 mmol/L (ref 22–32)
Calcium: 9 mg/dL (ref 8.9–10.3)
Chloride: 105 mmol/L (ref 98–111)
Creatinine, Ser: 0.8 mg/dL (ref 0.61–1.24)
GFR, Estimated: >60 mL/min (ref >60)
Glucose, Bld: 114 mg/dL — ABNORMAL HIGH (ref 70–99)
Potassium: 3.5 mmol/L (ref 3.5–5.1)
Sodium: 140 mmol/L (ref 135–145)

## 2022-10-31 LAB — CBC
HCT: 37.1 % — ABNORMAL LOW (ref 39.0–52.0)
Hemoglobin: 12.1 g/dL — ABNORMAL LOW (ref 13.0–17.0)
MCH: 29.4 pg (ref 26.0–34.0)
MCHC: 32.6 g/dL (ref 30.0–36.0)
MCV: 90 fL (ref 80.0–100.0)
Platelets: 312 10*3/uL (ref 150–400)
RBC: 4.12 MIL/uL — ABNORMAL LOW (ref 4.22–5.81)
RDW: 14.3 % (ref 11.5–15.5)
WBC: 8.8 10*3/uL (ref 4.0–10.5)
nRBC: 1.1 % — ABNORMAL HIGH (ref 0.0–0.2)

## 2022-10-31 LAB — PROCALCITONIN: Procalcitonin: <0.1 ng/mL

## 2022-10-31 MED ORDER — FENTANYL CITRATE (PF) 100 MCG/2ML IJ SOLN
INTRAMUSCULAR | Status: AC | PRN
  Administered 2022-10-31 (×3): 25 ug via INTRAVENOUS

## 2022-10-31 MED ORDER — IOHEXOL 300 MG/ML  SOLN
50.0000 mL | Freq: Once | INTRAMUSCULAR | Status: AC | PRN
  Administered 2022-10-31: 15 mL

## 2022-10-31 MED ORDER — MIDAZOLAM HCL 2 MG/2ML IJ SOLN
INTRAMUSCULAR | Status: AC | PRN
  Administered 2022-10-31: .5 mg via INTRAVENOUS
  Administered 2022-10-31: 1 mg via INTRAVENOUS
  Administered 2022-10-31: .5 mg via INTRAVENOUS

## 2022-10-31 MED ORDER — FENTANYL CITRATE (PF) 100 MCG/2ML IJ SOLN
INTRAMUSCULAR | Status: AC
  Filled 2022-10-31: qty 2

## 2022-10-31 MED ORDER — MIDAZOLAM HCL 2 MG/2ML IJ SOLN
INTRAMUSCULAR | Status: AC
  Filled 2022-10-31: qty 2

## 2022-10-31 MED ORDER — ENOXAPARIN SODIUM 40 MG/0.4ML IJ SOSY
40.0000 mg | PREFILLED_SYRINGE | INTRAMUSCULAR | Status: DC
  Administered 2022-11-01 – 2022-11-03 (×3): 40 mg via SUBCUTANEOUS
  Filled 2022-10-31 (×3): qty 0.4

## 2022-10-31 MED ORDER — LIDOCAINE HCL 1 % IJ SOLN
INTRAMUSCULAR | Status: AC
  Filled 2022-10-31: qty 20

## 2022-10-31 MED ORDER — CIPROFLOXACIN HCL 500 MG PO TABS: 500.0000 mg | ORAL_TABLET | Freq: Two times a day (BID) | ORAL | 0 refills | Status: DC

## 2022-10-31 NOTE — Hospital Course (Signed)
Patient is a 42 year old African-American male with past medical history of bipolar disorder and relapsing-remitting multiple sclerosis causing right-sided weakness as well as requiring suprapubic catheter.  Patient underwent a suprapubic catheter placement 2 months ago, but since then, has not been exchanged.  He presented to the emergency room on 5/13 with catheter malfunction, noting urine was leaking around it and passing through his penis instead of the catheter.  He also noted some dysuria and mild lower abdominal discomfort.  Labs otherwise unremarkable and vital signs stable.  Patient started on IV Rocephin and interventional radiology consulted for suprapubic catheter exchange.

## 2022-10-31 NOTE — Procedures (Signed)
Vascular and Interventional Radiology Procedure Note  Patient: BRALON CIMA DOB: Mar 22, 1981 Medical Record Number: [id number] Note Date/Time: 10/31/22 10:08 AM   Performing Physician: Roanna Banning, MD Assistant(s): None  Diagnosis: Catheter malfunction. and Leaking catheter. and upsize to Council catheter  Procedure: SUPRAPUBIC CYSTOSTOMY TUBE UPSIZE and EXCHANGE  Anesthesia: Conscious Sedation Complications: None Estimated Blood Loss: Minimal  Findings:  Successful exchange and upsize to a 79F Council suprapubic cystostomy tube under fluoroscopy.  Plan: Pt to follow up with Urology for routine SP catheter exchanges.   See detailed procedure note with images in PACS. The patient tolerated the procedure well without incident or complication and was returned to Floor Bed in stable condition.    Roanna Banning, MD [facility]   Pager. [phone] Clinic. [phone]

## 2022-10-31 NOTE — Evaluation (Signed)
Physical Therapy Evaluation Patient Details Name: Bradley Brandt MRN: [id number] DOB: 02-Mar-1981 Today's Date: 10/31/2022  History of Present Illness  42 y.o. male presents to [facility] hospital with suprapubic catheter malfunction, found to have UTI. Pt underwent catheter exchange on 5/14. PMH includes MS, bipolar disorder.  Clinical Impression  Pt presents to PT with deficits in functional mobility, gait, balance, ROM, strength, power, endurance, cognition, communication. Many of the pt's deficits appear to be chronic and related to his MS. Pt has been unable to walk without assistance since prior to January 2024 admission, and was recently working on standing and walking with 2 person assist at [employer] prior to discharge home. Currently the pt has a R knee contracture along with a strong posterior lean which place him at a high risk for falls. Pt is unable to sufficiently care for himself at this time and lacks consistent caregiver support to maintain safety in the home. PT recommends short term inpatient PT services in an effort to improve stability and reduce falls risk.       Recommendations for follow up therapy are one component of a multi-disciplinary discharge planning process, led by the attending physician.  Recommendations may be updated based on patient status, additional functional criteria and insurance authorization.  Follow Up Recommendations Can patient physically be transported by private vehicle: No     Assistance Recommended at Discharge Frequent or constant Supervision/Assistance  Patient can return home with the following  Two people to help with walking and/or transfers;A lot of help with bathing/dressing/bathroom;Assistance with cooking/housework;Help with stairs or ramp for entrance;Assist for transportation    Equipment Recommendations  (defer to post-acute setting)  Recommendations for Other Services       Functional Status Assessment Patient has had a recent  decline in their functional status and demonstrates the ability to make significant improvements in function in a reasonable and predictable amount of time.     Precautions / Restrictions Precautions Precautions: Fall Precaution Comments: R knee flexion contracture ~10 degrees Restrictions Weight Bearing Restrictions: No Other Position/Activity Restrictions: R knee flexion contracture      Mobility  Bed Mobility Overal bed mobility: Needs Assistance Bed Mobility: Supine to Sit, Sit to Supine     Supine to sit: Min assist Sit to supine: Min assist        Transfers Overall transfer level: Needs assistance Equipment used: Rolling walker (2 wheels), 1 person hand held assist Transfers: Sit to/from Stand Sit to Stand: Mod assist, Max assist           General transfer comment: strong posterior lean, unable to extend hips due to R knee flexion contracture    Ambulation/Gait                  Stairs            Wheelchair Mobility    Modified Rankin (Stroke Patients Only)       Balance Overall balance assessment: Needs assistance Sitting-balance support: Single extremity supported, Feet supported Sitting balance-Leahy Scale: Poor     Standing balance support: Bilateral upper extremity supported, Reliant on assistive device for balance Standing balance-Leahy Scale: Poor Standing balance comment: modA, posterior lean                             Pertinent Vitals/Pain Pain Assessment Pain Assessment: Faces Faces Pain Scale: Hurts little more Pain Location: R knee Pain Descriptors / Indicators: Sore Pain Intervention(s):  Monitored during session    Home Living Family/patient expects to be discharged to:: Private residence Living Arrangements: Alone Available Help at Discharge: Family;Available PRN/intermittently (sister next door, significant other) Type of Home: House Home Access: Ramped entrance       Home Layout: One level Home  Equipment: Agricultural consultant (2 wheels);Cane - single point;Wheelchair - manual;BSC/3in1;Hospital bed;Other (comment) (hoyer lift)      Prior Function Prior Level of Function : Needs assist             Mobility Comments: pt recently discharged from [place] [facility], significant other reports this was for behavioral reasons. Pt was working on standing in mechanical assist standing frame and ambulatign fro very short distances with 2 person assistance ADLs Comments: SO reports assist with bathing and dressing now.  Sister supports with meals, iADL and community mobility.     Hand Dominance   Dominant Hand: Right    Extremity/Trunk Assessment   Upper Extremity Assessment Upper Extremity Assessment: Defer to OT evaluation RUE Deficits / Details: Able to move through full AROM. RUE Sensation: WNL RUE Coordination: decreased fine motor;decreased gross motor LUE Deficits / Details: Able to move through full AROM. LUE Sensation: WNL LUE Coordination: decreased fine motor;decreased gross motor    Lower Extremity Assessment Lower Extremity Assessment: Generalized weakness;RLE deficits/detail RLE Deficits / Details: pt with R knee flexion contracture ~10 degrees from full extension    Cervical / Trunk Assessment Cervical / Trunk Assessment: Kyphotic  Communication   Communication: Expressive difficulties  Cognition Arousal/Alertness: Awake/alert Behavior During Therapy: WFL for tasks assessed/performed Overall Cognitive Status: Impaired/Different from baseline Area of Impairment: Memory, Awareness, Safety/judgement, Problem solving                     Memory: Decreased short-term memory, Decreased recall of precautions   Safety/Judgement: Decreased awareness of safety, Decreased awareness of deficits Awareness: Emergent Problem Solving: Slow processing, Decreased initiation, Difficulty sequencing, Requires verbal cues, Requires tactile cues          General Comments  General comments (skin integrity, edema, etc.): VSS on RA    Exercises     Assessment/Plan    PT Assessment Patient needs continued PT services  PT Problem List Decreased strength;Decreased range of motion;Decreased activity tolerance;Decreased balance;Decreased mobility;Decreased coordination;Decreased cognition;Decreased knowledge of use of DME;Decreased safety awareness;Decreased knowledge of precautions;Pain       PT Treatment Interventions DME instruction;Gait training;Functional mobility training;Therapeutic activities;Therapeutic exercise;Balance training;Neuromuscular re-education;Cognitive remediation;Patient/family education;Wheelchair mobility training    PT Goals (Current goals can be found in the Care Plan section)  Acute Rehab PT Goals Patient Stated Goal: to go to rehab, continue working on standing and balance PT Goal Formulation: With patient/family Time For Goal Achievement: 11/14/22 Potential to Achieve Goals: Fair    Frequency Min 2X/week     Co-evaluation               AM-PAC PT "6 Clicks" Mobility  Outcome Measure Help needed turning from your back to your side while in a flat bed without using bedrails?: A Little Help needed moving from lying on your back to sitting on the side of a flat bed without using bedrails?: A Little Help needed moving to and from a bed to a chair (including a wheelchair)?: A Lot Help needed standing up from a chair using your arms (e.g., wheelchair or bedside chair)?: A Lot Help needed to walk in hospital room?: Total Help needed climbing 3-5 steps with a railing? : Total 6  Click Score: 12    End of Session Equipment Utilized During Treatment: Gait belt Activity Tolerance: Patient tolerated treatment well Patient left: in bed;with call bell/phone within reach;with bed alarm set;with family/visitor present Nurse Communication: Mobility status;Need for lift equipment PT Visit Diagnosis: Other abnormalities of gait and  mobility (R26.89);Muscle weakness (generalized) (M62.81);Other symptoms and signs involving the nervous system (R29.898)    Time: 1610-9604 PT Time Calculation (min) (ACUTE ONLY): 22 min   Charges:   PT Evaluation $PT Eval Moderate Complexity: 1 Mod          Arlyss Gandy, PT, DPT Acute Rehabilitation Office [phone]   Arlyss Gandy 10/31/2022, 5:05 PM

## 2022-10-31 NOTE — Progress Notes (Signed)
[facility] Progress Note  Patient: Bradley Brandt    IHK:[id number]  DOA: 10/30/2022    Date of Service: the patient was seen and examined on 10/31/2022  Brief hospital course: Patient is a 42 year old African-American male with past medical history of bipolar disorder and relapsing-remitting multiple sclerosis causing right-sided weakness as well as requiring suprapubic catheter.  Patient underwent a suprapubic catheter placement 2 months ago, but since then, has not been exchanged.  He presented to the emergency room on 5/13 with catheter malfunction, noting urine was leaking around it and passing through his penis instead of the catheter.  He also noted some dysuria and mild lower abdominal discomfort.  Labs otherwise unremarkable and vital signs stable.  Patient started on IV Rocephin and interventional radiology consulted for suprapubic catheter exchange.   Assessment and Plan: * UTI (urinary tract infection) This was secondary to a suprapubic catheter malfunction.  Started on IV Rocephin with cultures pending.  Suprapubic catheter malfunction: Patient has not had catheter changed since placement 2 months ago.  Seen by IR and exchanged.  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  Require suprapubic catheter.  Bipolar 1 disorder (HCC) - We will continue his Seroquel.  Overweight: Patient meets criteria for BMI greater than 25  Weakness: PT and OT to see.  Patient initially noted to be soiled with several days of feces.  There is concerns about whether he can properly care for himself at home.  May need skilled nursing again short-term, which is what family is hoping for.   Body mass index is 25.94 kg/m.      Consultants: Interventional radiology  Procedures: Status post catheter exchange 5/14  Antimicrobials: IV Rocephin 5/13-present  Code Status: Full code   Subjective: Patient feels  weak  Objective: Vital signs were reviewed and unremarkable. Vitals:   10/31/22 0910 10/31/22 0915  BP: (!) 139/95   Pulse: 95 89  Resp: (!) 23 13  Temp:    SpO2: 97% 96%    Intake/Output Summary (Last 24 hours) at 10/31/2022 1549 Last data filed at 10/31/2022 0800 Gross per 24 hour  Intake 100.64 ml  Output 0 ml  Net 100.64 ml   Filed Weights   10/30/22 1634  Weight: 84.4 kg   Body mass index is 25.94 kg/m.  Exam:  General: Fatigued, alert and oriented x 2-3 HEENT: Normocephalic and atraumatic, mucous membranes are moist Cardiovascular: Regular rate and rhythm, S1-S2 Respiratory: Clear to auscultation bilaterally Abdomen: Soft, nontender, nondistended, positive bowel sounds Musculoskeletal: No clubbing or cyanosis, trace pitting edema Psychiatry: Appropriate although underlying bipolar disorder. Neurology: Weakness of his lower extremity secondary to MS  Data Reviewed: Hemoglobin of 12.1.  Disposition:  Status is: Observation    Anticipated discharge date: 5/15  Family Communication: Will call family DVT Prophylaxis: enoxaparin (LOVENOX) injection 40 mg Start: 11/01/22 2000    Author: Hollice Espy ,MD 10/31/2022 3:49 PM  To reach On-call, see care teams to locate the attending and reach out via [email or web address]. Between 7PM-7AM, please contact night-coverage If you still have difficulty reaching the attending provider, please page the [facility] (Director on Call) for [facility] on amion for assistance.

## 2022-10-31 NOTE — Consult Note (Signed)
Chief Complaint: Patient was seen in consultation today for supra pubic catheter exchange/upsize Chief Complaint  Patient presents with   Needs Foley Changed   Abdominal Pain   at the request of Dr Andrez Grime  Supervising Physician: Gilmer Mor  Patient Status: [facility] - In-pt  History of Present Illness: Bradley Brandt is a 42 y.o. male    FULL Code Status per father  Pt with MS; Bipolar Urinary retention Supra pubic catheter placed in IR 09/07/22--- 12 Fr Now leaking and malfunction x week Presented to ED for evaluation Has not had IR exchange since placement  Request for exchange/upsize Dr Loreta Ave aware and agreeable to procedure  Past Medical History:  Diagnosis Date   Bipolar 1 disorder (HCC)    Bipolar disorder, unspecified (HCC) 01/03/2013   MS (multiple sclerosis) (HCC) dx 2006   relapsing-remitting right sided weakness   Neuromuscular disorder (HCC)    MS    Past Surgical History:  Procedure Laterality Date   IR FLUORO GUIDED NEEDLE PLC ASPIRATION/INJECTION LOC  09/07/2022   TUMOR REMOVAL  1983   abdomen benign    Allergies: Lithium  Medications: Prior to Admission medications   Medication Sig Start Date End Date Taking? Authorizing Provider  acetaminophen (TYLENOL) 500 MG tablet Take 500 mg by mouth every 8 (eight) hours as needed for moderate pain.   Yes [provider]  baclofen (LIORESAL) 20 MG tablet Take 1 tablet (20 mg total) by mouth 3 (three) times daily. 08/18/22  Yes Paseda, Dowdell Kay, FNP  DULoxetine (CYMBALTA) 30 MG capsule Take 1 capsule (30 mg total) by mouth daily. 07/22/22  Yes Paseda, Limb Kay, FNP  feeding supplement (ENSURE ENLIVE / ENSURE PLUS) LIQD Take 237 mLs by mouth 3 (three) times daily between meals. 08/10/22  Yes Mapp, Tavien, MD  gabapentin (NEURONTIN) 400 MG capsule Take 1 capsule (400 mg total) by mouth 3 (three) times daily. 08/10/22  Yes Mapp, Tavien, MD  lidocaine (HM LIDOCAINE PATCH) 4 % Place 1 patch onto the  skin daily. 10/12/21  Yes Passmore, Enid Derry I, NP  natalizumab (TYSABRI) 300 MG/15ML injection Inject 15 mLs (300 mg total) into the vein every 28 (twenty-eight) days. 09/05/16  Yes Sater, Pearletha Furl, MD  oxybutynin (DITROPAN-XL) 10 MG 24 hr tablet Take 10 mg by mouth daily. 10/09/22  Yes [provider]  phenazopyridine (PYRIDIUM) 200 MG tablet Take 1 tablet (200 mg total) by mouth 3 (three) times daily. 09/14/22  Yes Mardene Sayer, MD  QUEtiapine (SEROQUEL) 200 MG tablet Take 1 tablet (200 mg total) by mouth at bedtime. Take a half tablet at night for 4 days then increase to a whole tablet each night. 07/22/22  Yes Paseda, Kampe Kay, FNP  Vitamin D, Ergocalciferol, (DRISDOL) 1.25 MG (50000 UNIT) CAPS capsule Take 1 capsule (50,000 Units total) by mouth every 7 (seven) days. 06/29/22  Yes Sater, Pearletha Furl, MD     Family History  Adopted: Yes  Problem Relation Age of Onset   Lupus Mother    Ataxia Neg Hx    Chorea Neg Hx    Dementia Neg Hx    Mental retardation Neg Hx    Migraines Neg Hx    Multiple sclerosis Neg Hx    Neurofibromatosis Neg Hx    Neuropathy Neg Hx    Parkinsonism Neg Hx    Seizures Neg Hx    Stroke Neg Hx     Social History   Socioeconomic History   Marital status: Single  Spouse name: Not on file   Number of children: Not on file   Years of education: Not on file   Highest education level: Not on file  Occupational History   Not on file  Tobacco Use   Smoking status: Every Day    Packs/day: 1    Types: Cigarettes   Smokeless tobacco: Never  Vaping Use   Vaping Use: Former  Substance and Sexual Activity   Alcohol use: Not Currently    Alcohol/week: 72.0 standard drinks of alcohol    Types: 10 Glasses of wine, 12 Cans of beer, 50 Shots of liquor per week   Drug use: Not Currently    Frequency: 7.0 times per week    Types: Marijuana    Comment: daily   Sexual activity: Yes    Partners: Female    Birth control/protection: None  Other Topics  Concern   Not on file  Social History Narrative   Not on file   Social Determinants of Health   Financial Resource Strain: Not on file  Food Insecurity: Patient Declined (10/30/2022)   Hunger Vital Sign    Worried About Running Out of Food in the Last Year: Patient declined    Ran Out of Food in the Last Year: Patient declined  Transportation Needs: No Transportation Needs (10/30/2022)   PRAPARE - Administrator, Civil Service (Medical): No    Lack of Transportation (Non-Medical): No  Physical Activity: Not on file  Stress: Not on file  Social Connections: Not on file    Review of Systems: A 12 point ROS discussed and pertinent positives are indicated in the HPI above.  All other systems are negative.   Vital Signs: BP 139/76 (BP Location: Right Arm)   Pulse 87   Temp 99 F (37.2 C) (Oral)   Resp 18   Ht 5\' 11"  (1.803 m)   Wt 186 lb (84.4 kg)   SpO2 98%   BMI 25.94 kg/m     Physical Exam Vitals reviewed.  Constitutional:      Comments: Groggy--- hard to keep awake to even talk to Did say name and dob  Cardiovascular:     Rate and Rhythm: Normal rate and regular rhythm.  Pulmonary:     Breath sounds: Normal breath sounds.  Abdominal:     Tenderness: There is no abdominal tenderness.  Skin:    General: Skin is warm.  Neurological:     Comments: Spoke to Father Windy Fast via phone He consents to procedure     Imaging: CT Head Wo Contrast  Result Date: 10/10/2022 CLINICAL DATA:  Status post fall. EXAM: CT HEAD WITHOUT CONTRAST TECHNIQUE: Contiguous axial images were obtained from the base of the skull through the vertex without intravenous contrast. RADIATION DOSE REDUCTION: This exam was performed according to the departmental dose-optimization program which includes automated exposure control, adjustment of the mA and/or kV according to patient size and/or use of iterative reconstruction technique. COMPARISON:  May 08, 2010 FINDINGS: Brain: There  is mild cerebral atrophy with widening of the extra-axial spaces and ventricular dilatation. There are areas of decreased attenuation within the white matter tracts of the supratentorial brain, consistent with microvascular disease changes. Vascular: No hyperdense vessel or unexpected calcification. Skull: Normal. Negative for fracture or focal lesion. Sinuses/Orbits: There is marked severity right maxillary sinus and right ethmoid sinus mucosal thickening. Other: None. IMPRESSION: 1. No acute intracranial abnormality. 2. 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 3. Right  maxillary sinus and right ethmoid sinus disease. Electronically Signed   By: Aram Candela M.D.   On: 10/10/2022 02:22   DG Ankle Complete Right  Result Date: 10/10/2022 CLINICAL DATA:  Fall EXAM: RIGHT ANKLE - COMPLETE 3+ VIEW COMPARISON:  None Available. FINDINGS: There is no evidence of fracture, dislocation, or joint effusion. There is no evidence of arthropathy or other focal bone abnormality. Soft tissues are unremarkable. IMPRESSION: Negative. Electronically Signed   By: Charlett Nose M.D.   On: 10/10/2022 01:49   DG Hip Unilat W or Wo Pelvis 2-3 Views Right  Result Date: 10/10/2022 CLINICAL DATA:  Fall EXAM: DG HIP (WITH OR WITHOUT PELVIS) 2-3V RIGHT COMPARISON:  None Available. FINDINGS: There is no evidence of hip fracture or dislocation. There is no evidence of arthropathy or other focal bone abnormality. Pelvic drainage catheter in place in the midline. IMPRESSION: No acute bony abnormality. Electronically Signed   By: Charlett Nose M.D.   On: 10/10/2022 01:49    Labs:  CBC: Recent Labs    09/14/22 1027 10/30/22 1645 10/30/22 2012 10/31/22 0320  WBC 8.9 8.7 10.0 8.8  HGB 11.7* 13.3 12.2* 12.1*  HCT 37.4* 41.2 38.3* 37.1*  PLT 269 371 336 312    COAGS: Recent Labs    07/30/22 0609 09/07/22 0844  INR 1.1 1.0    BMP: Recent Labs    08/08/22 0012 09/14/22 1027  10/30/22 1645 10/30/22 2012 10/31/22 0320  NA 135 138 140  --  140  K 4.0 4.3 3.9  --  3.5  CL 101 100 104  --  105  CO2 27 30 28   --  24  GLUCOSE 95 92 98  --  114*  BUN 27* 11 8  --  13  CALCIUM 8.6* 9.4 9.7  --  9.0  CREATININE 0.85 0.81 0.81 0.89 0.80  GFRNONAA >60 >60 >60 >60 >60    LIVER FUNCTION TESTS: Recent Labs    08/04/22 0209 08/06/22 0254 08/07/22 0337 08/08/22 0012  BILITOT 0.1* 0.2* 0.5 0.2*  AST 33 37 36 30  ALT 43 46* 48* 44  ALKPHOS 47 47 46 44  PROT 5.6* 6.5 6.4* 6.7  ALBUMIN 2.2* 2.0* 2.0* 2.0*    TUMOR MARKERS: No results for input(s): "AFPTM", "CEA", "CA199", "CHROMGRNA" in the last 8760 hours.  Assessment and Plan:  Scheduled for supra pubic catheter evaluation/exchange/upsize Pts father is aware of procedure benefits and risks including but not limited to Infection; bleeding/ damage to surrounding structures Agreeable to proceed Consent signed and in chart  Thank you for this interesting consult.  I greatly enjoyed meeting Bradley Brandt and look forward to participating in their care.  A copy of this report was sent to the requesting provider on this date.  Electronically Signed: Robet Leu, PA-C 10/31/2022, 8:10 AM   I spent a total of 20 Minutes    in face to face in clinical consultation, greater than 50% of which was counseling/coordinating care for Supra pubic catheter exchange;upsize

## 2022-10-31 NOTE — Evaluation (Signed)
Occupational Therapy Evaluation Patient Details Name: Bradley Brandt MRN: [id number] DOB: 05-28-1981 Today's Date: 10/31/2022   History of Present Illness 42 y.o. African-American male adm 5/13 with medical history significant for bipolar 1 disorder, relapsing-remitting right-sided weakness with multiple sclerosis, who presented to the emergency room with acute onset of suprapubic catheter malfunction.   Clinical Impression   Patient admitted for the diagnosis above.  PTA he lives at home alone with occasional support form his SO and sister.  SO reports declining mobility and ADL status at home.  Currently he is needing up to Mod A for simple transfers and ADL from a sit to stand level.  OT is indicated in the acute setting to address deficits, and the Patient will benefit from continued inpatient follow up therapy, <3 hours/day.  Patient does not have the needed 24 hour assist to return directly home.          Recommendations for follow up therapy are one component of a multi-disciplinary discharge planning process, led by the attending physician.  Recommendations may be updated based on patient status, additional functional criteria and insurance authorization.   Assistance Recommended at Discharge Frequent or constant Supervision/Assistance  Patient can return home with the following Assist for transportation;Assistance with cooking/housework;A lot of help with bathing/dressing/bathroom;A lot of help with walking and/or transfers    Functional Status Assessment  Patient has had a recent decline in their functional status and demonstrates the ability to make significant improvements in function in a reasonable and predictable amount of time.  Equipment Recommendations  None recommended by OT    Recommendations for Other Services       Precautions / Restrictions Precautions Precautions: Fall Precaution Comments: suprapubic catheter Restrictions Weight Bearing Restrictions: No Other  Position/Activity Restrictions: R knee flexion contracture      Mobility Bed Mobility Overal bed mobility: Needs Assistance Bed Mobility: Supine to Sit, Sit to Supine     Supine to sit: Min assist Sit to supine: Min assist        Transfers Overall transfer level: Needs assistance Equipment used: Rolling walker (2 wheels) Transfers: Sit to/from Stand, Bed to chair/wheelchair/BSC Sit to Stand: Mod assist Stand pivot transfers: Mod assist                Balance Overall balance assessment: Needs assistance Sitting-balance support: Feet supported Sitting balance-Leahy Scale: Fair     Standing balance support: Reliant on assistive device for balance Standing balance-Leahy Scale: Poor                             ADL either performed or assessed with clinical judgement   ADL Overall ADL's : Needs assistance/impaired Eating/Feeding: Set up;Sitting   Grooming: Wash/dry hands;Wash/dry face;Sitting;Min guard   Upper Body Bathing: Minimal assistance;Sitting   Lower Body Bathing: Moderate assistance;Sit to/from stand   Upper Body Dressing : Minimal assistance;Sitting   Lower Body Dressing: Moderate assistance;Sit to/from stand Lower Body Dressing Details (indicate cue type and reason): increased time Toilet Transfer: Moderate assistance;Stand-pivot;BSC/3in1;Rolling walker (2 wheels) Toilet Transfer Details (indicate cue type and reason): able to take small lateral steps with R knee supported at RW level.                 Vision Patient Visual Report: No change from baseline       Perception     Praxis      Pertinent Vitals/Pain Pain Assessment Pain Assessment: No/denies pain  Hand Dominance Right   Extremity/Trunk Assessment Upper Extremity Assessment Upper Extremity Assessment: RUE deficits/detail;LUE deficits/detail RUE Deficits / Details: Able to move through full AROM. RUE Sensation: WNL RUE Coordination: decreased fine  motor;decreased gross motor LUE Deficits / Details: Able to move through full AROM. LUE Sensation: WNL LUE Coordination: decreased fine motor;decreased gross motor   Lower Extremity Assessment Lower Extremity Assessment: Defer to PT evaluation   Cervical / Trunk Assessment Cervical / Trunk Assessment: Kyphotic   Communication Communication Communication: Expressive difficulties   Cognition Arousal/Alertness: Awake/alert Behavior During Therapy: WFL for tasks assessed/performed Overall Cognitive Status: Within Functional Limits for tasks assessed                                 General Comments: Patient is oriented to hospital, current situation and is following commands.     General Comments   VSS on RA    Exercises     Shoulder Instructions      Home Living Family/patient expects to be discharged to:: Private residence Living Arrangements: Alone Available Help at Discharge: Family;Available PRN/intermittently Type of Home: House Home Access: Ramped entrance     Home Layout: One level     Bathroom Shower/Tub: Producer, television/film/video: Handicapped height Bathroom Accessibility: Yes How Accessible: Accessible via walker Home Equipment: Rollator (4 wheels);Cane - single point;Grab bars - tub/shower;Shower seat;Wheelchair - manual          Prior Functioning/Environment Prior Level of Function : Needs assist             Mobility Comments: SO reports increasing assist for stand pivot transfers.  Spending bulk of time in wheelchair. ADLs Comments: SO reports assist with bathing and dressing now.  Sister supports with meals, iADL and community mobility.        OT Problem List: Decreased strength;Decreased range of motion;Impaired balance (sitting and/or standing);Decreased coordination;Impaired tone      OT Treatment/Interventions: Self-care/ADL training;Therapeutic activities;DME and/or AE instruction;Patient/family education;Balance  training    OT Goals(Current goals can be found in the care plan section) Acute Rehab OT Goals Patient Stated Goal: Take steps OT Goal Formulation: With patient Time For Goal Achievement: 11/14/22 Potential to Achieve Goals: Good ADL Goals Pt Will Perform Grooming: with set-up;sitting Pt Will Perform Upper Body Dressing: with set-up;sitting Pt Will Perform Lower Body Dressing: with min assist;sit to/from stand Pt Will Transfer to Toilet: with min assist;stand pivot transfer;bedside commode  OT Frequency: Min 2X/week    Co-evaluation              AM-PAC OT "6 Clicks" Daily Activity     Outcome Measure Help from another person eating meals?: A Little Help from another person taking care of personal grooming?: A Little Help from another person toileting, which includes using toliet, bedpan, or urinal?: A Lot Help from another person bathing (including washing, rinsing, drying)?: A Lot Help from another person to put on and taking off regular upper body clothing?: A Little Help from another person to put on and taking off regular lower body clothing?: A Lot 6 Click Score: 15   End of Session Equipment Utilized During Treatment: Gait belt;Rolling walker (2 wheels) Nurse Communication: Mobility status  Activity Tolerance: Patient tolerated treatment well Patient left: in bed;with call bell/phone within reach;with family/visitor present  OT Visit Diagnosis: Unsteadiness on feet (R26.81);Muscle weakness (generalized) (M62.81);History of falling (Z91.81)  Time: 8119-1478 OT Time Calculation (min): 22 min Charges:  OT General Charges $OT Visit: 1 Visit OT Evaluation $OT Eval Moderate Complexity: 1 Mod  10/31/2022  RP, OTR/L  Acute Rehabilitation Services  Office:  [phone]   Suzanna Obey 10/31/2022, 4:24 PM

## 2022-11-01 DIAGNOSIS — G35 Multiple sclerosis: Secondary | ICD-10-CM | POA: Diagnosis not present

## 2022-11-01 DIAGNOSIS — F319 Bipolar disorder, unspecified: Secondary | ICD-10-CM | POA: Diagnosis not present

## 2022-11-01 DIAGNOSIS — T83510A Infection and inflammatory reaction due to cystostomy catheter, initial encounter: Secondary | ICD-10-CM | POA: Diagnosis not present

## 2022-11-01 DIAGNOSIS — N390 Urinary tract infection, site not specified: Secondary | ICD-10-CM | POA: Diagnosis not present

## 2022-11-01 MED ORDER — SODIUM CHLORIDE 0.9 % IV SOLN
1.0000 g | INTRAVENOUS | Status: DC
  Administered 2022-11-02 – 2022-11-03 (×3): 1 g via INTRAVENOUS
  Filled 2022-11-01 (×3): qty 10

## 2022-11-01 MED ORDER — SODIUM CHLORIDE 0.9 % IV SOLN
1.0000 g | INTRAVENOUS | Status: AC
  Administered 2022-11-01: 1 g via INTRAVENOUS
  Filled 2022-11-01: qty 10

## 2022-11-01 NOTE — TOC Progression Note (Signed)
Transition of Care [facility]) - Initial/Assessment Note    Patient Details  Name: Bradley Brandt MRN: [id number] Date of Birth: 10/12/1980  Transition of Care [facility]) CM/SW Contact:    Ralene Bathe, LCSWA Phone Number: 11/01/2022, 3:08 PM  Clinical Narrative:                 RE: Bradley Brandt Date of Birth: 01/29/1981 Date: 11/01/2022  Please be advised that the above-named patient will require a short-term nursing home stay - anticipated 30 days or less for rehabilitation and strengthening.  The plan is for return home.  Expected Discharge Plan: Skilled Nursing Facility Barriers to Discharge: Continued Medical Work up, English as a second language teacher, SNF Pending bed offer   Patient Goals and CMS Choice Patient states their goals for this hospitalization and ongoing recovery are:: To go to a SNF for rehab CMS Medicare.gov Compare Post Acute Care list provided to:: Patient Choice offered to / list presented to : Patient      Expected Discharge Plan and Services In-house Referral: Clinical Social Work     Living arrangements for the past 2 months: Single Family Home Expected Discharge Date: 10/31/22                                    Prior Living Arrangements/Services Living arrangements for the past 2 months: Single Family Home Lives with:: Self Patient language and need for interpreter reviewed:: Yes Do you feel safe going back to the place where you live?: Yes      Need for Family Participation in Patient Care: Yes (Comment) Care giver support system in place?: No (comment)   Criminal Activity/Legal Involvement Pertinent to Current Situation/Hospitalization: No - Comment as needed  Activities of Daily Living Home Assistive Devices/Equipment: Walker (specify type) ADL Screening (condition at time of admission) Patient's cognitive ability adequate to safely complete daily activities?: Yes Is the patient deaf or have difficulty hearing?: No Does the patient have  difficulty seeing, even when wearing glasses/contacts?: No Does the patient have difficulty concentrating, remembering, or making decisions?: No Patient able to express need for assistance with ADLs?: Yes Does the patient have difficulty dressing or bathing?: Yes Independently performs ADLs?: No Does the patient have difficulty walking or climbing stairs?: Yes Weakness of Legs: Both Weakness of Arms/Hands: Both  Permission Sought/Granted   Permission granted to share information with : Yes, Verbal Permission Granted     Permission granted to share info w AGENCY: SNFs        Emotional Assessment Appearance:: Appears older than stated age Attitude/Demeanor/Rapport: Engaged Affect (typically observed): Accepting, Pleasant Orientation: : Oriented to Situation, Oriented to  Time, Oriented to Place, Oriented to Self Alcohol / Substance Use: Not Applicable Psych Involvement: No (comment)  Admission diagnosis:  UTI (urinary tract infection) [N39.0] Urinary tract infection associated with catheterization of urinary tract, unspecified indwelling urinary catheter type, initial encounter (HCC) [W09.811B, N39.0] Patient Active Problem List   Diagnosis Date Noted   UTI (urinary tract infection) 10/30/2022   Bipolar 1 disorder (HCC) 10/30/2022   Acute urinary retention 08/01/2022   Fever 08/01/2022   Rhabdomyolysis 07/30/2022   Multiple sclerosis exacerbation (HCC) 07/12/2022   Marijuana use 11/22/2020   History of marijuana use 11/22/2020   Chronic pain syndrome 11/21/2020   Pharmacologic therapy 11/21/2020   Disorder of skeletal system 11/21/2020   Problems influencing health status 11/21/2020   MS (multiple sclerosis) (HCC)  Neuromuscular disorder (HCC)    Bipolar disorder in full remission (HCC) 11/16/2020   Other insomnia 08/11/2020   Generalized anxiety disorder 08/11/2020   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HCC) 12/31/2017   Optic neuritis 08/02/2017   High risk medication use  08/02/2017   Vitamin D deficiency 09/19/2016   Hypercholesterolemia 09/19/2016   Numbness 12/16/2015   Spastic gait 12/16/2015   Urinary hesitancy 12/16/2015   Low back pain with sciatica 09/28/2014   Polysubstance dependence (HCC) 02/09/2014   Combined drug dependence excluding opioids (HCC) 02/09/2014   Relapsing remitting multiple sclerosis (HCC) 05/28/2013   Elevated BP 01/06/2013   Bipolar disorder, unspecified (HCC) 01/03/2013   Bipolar disorder (HCC) 01/03/2013   Right hemiparesis (HCC) 01/02/2013   Multiple sclerosis flare 01/02/2013   Hemiplegia (HCC) 01/02/2013   PCP:  Quentin Angst, MD Pharmacy:   [facility] [phone] - [place], [place] - [address] AT [facility] OF [address] [place] [address] Phone: [phone] Fax: [phone]  [facility] - [place], [place] - [address] [place] [address] Phone: [phone] Fax: [phone]  [facility] [address] [place] - [address] [place] [address] Phone: [phone] Fax: [phone]     Social Determinants of Health (SDOH) Social History: SDOH Screenings   Food Insecurity: Patient Declined (10/30/2022)  Housing: Low Risk  (10/30/2022)  Transportation Needs: No Transportation Needs (10/30/2022)  Utilities: Not At Risk (10/30/2022)  Depression (PHQ2-9): Low Risk  (10/12/2021)  Recent Concern: Depression (PHQ2-9) - Medium Risk (08/04/2021)  Tobacco Use: High Risk (10/31/2022)   SDOH Interventions:     Readmission Risk Interventions     No data to display

## 2022-11-01 NOTE — TOC Initial Note (Signed)
Transition of Care [facility]) - Initial/Assessment Note    Patient Details  Name: Bradley Brandt MRN: [id number] Date of Birth: September 28, 1980  Transition of Care [facility]) CM/SW Contact:    Ralene Bathe, LCSWA Phone Number: 11/01/2022, 2:52 PM  Clinical Narrative:                 CSW received consult for possible SNF placement at time of discharge. CSW spoke with patient.  Patient expressed understanding of PT recommendation and is agreeable to SNF placement at time of discharge. Patient does not report a preference, but does not want to return to the facility where he was previously. CSW discussed insurance authorization process and provided Medicare SNF ratings list. CSW will send out referrals for review and provide bed offers as available.   Skilled Nursing Rehab Facilities-   [email or web address]   Ratings out of 5 stars (5 the highest)   Name Address  Phone # Quality Care Staffing Health Inspection Overall  [facility] [address] [place] [phone] 2 1 5 4   [facility] [address], [place] [phone] 4 1 3 2   [facility] [address], [facility] [phone] 2 1 1 1   [facility] [address] [place] [phone] 4 1 3 2   [facility]  [address], [place] [phone] 3 2 5 5   [facility] [address] [facility] [phone] 2 1 2 1   [facility] [address] [place] [phone] 4 1 2 1   [facility] [address], [place] [phone] 2 4 3 3   [address] ([facility]) [address] [place] [phone] 3 2 2 2   [facility] [address] [place], [place] [phone] 1 2 1 1   [facility] [address], [place] [phone] 3 1 1 1   [facility] [address] [place] [phone] 4 3 4 4   [facility] [address] [place] [phone] 3 4 3 3           [facility] [address], [place] [phone]      [facility], [place] [address], [place] [phone] 1 1 2 1   [facility] [address] [employer] [phone] 2 2 4 4   [facility] [place] [address], [place] [phone] 2 1 4 3   [facility] [address], [place] [phone] 3 3 3 3           [address] (no [facility]) [address], [place] [phone] 4 4 5 5   [facility] (No Humana) [address] Lavera Guise [phone] 2 2 4 4   [facility] [address], [place] [phone] 2 1 2 1   [facility] (No UHC) [address], [place] [phone] 5 5 5 5   [facility] [address] [facility] [phone] 2 3 5 5   [facility] [address], [place] [phone] 2 1 1 1   [facility] [address] [place] [phone] 5 2 5 5   [facility]  [address] [place] [phone] 2 2 2 2   [facility] [address] [place] [phone] 4 2 1 1   [facility] [address] [place], [place] [phone] 2 2 3 3           [facility] [address] [place] [phone] 1 1 1 1   [facility] [address] Evlyn Clines  [phone] 2 3 3 3   [facility] (No Humana) [address] [place] [phone]  2 1 3 2   [facility]) [address], [place]an [phone] 1 1 1 1   [facility]  [address] [place]an [phone] 3 2 5 5   [facility] [address], [place] [phone] 2 1 1 1           [facility] [address] [place], [place] [phone] 4 4 5 5   [facility])  [address], [place] [phone] 2 1 2 1   [facility] [address], [place] [phone]  1 4 3   [facility] [address], [place] [phone] 3 5 4 5   [address] [place], [place] [phone] 3 2 2 2   [facility]) [address] [place] [phone] 2 1 3 2      Expected Discharge Plan: Skilled Nursing Facility Barriers to Discharge: Continued Medical Work up, English as a second language teacher, SNF Pending bed offer   Patient Goals and CMS Choice Patient states their goals for this hospitalization and ongoing recovery are:: To go to a SNF for rehab CMS Medicare.gov Compare Post Acute Care list provided to:: Patient Choice offered to / list presented to : Patient      Expected Discharge Plan and Services In-house Referral: Clinical Social Work     Living arrangements for the past 2 months: Single Family Home Expected Discharge Date: 10/31/22                                    Prior Living Arrangements/Services Living arrangements for the past 2 months: Single Family Home Lives with:: Self Patient language and need for interpreter reviewed:: Yes Do you feel safe going back to the place where you live?: Yes      Need for Family Participation in Patient Care: Yes (Comment) Care giver support system in place?: No (comment)   Criminal Activity/Legal Involvement Pertinent to Current Situation/Hospitalization: No - Comment as needed  Activities of Daily Living Home Assistive Devices/Equipment: Walker (specify type) ADL Screening (condition at time of admission) Patient's cognitive ability adequate to safely complete daily activities?: Yes Is the patient deaf or have difficulty hearing?: No Does the patient have difficulty seeing, even when wearing glasses/contacts?: No Does the patient have difficulty concentrating, remembering, or making decisions?: No Patient able to express need for assistance with ADLs?: Yes Does the patient have difficulty dressing or bathing?: Yes Independently performs ADLs?: No Does the patient have difficulty walking or climbing stairs?: Yes Weakness of Legs: Both Weakness of Arms/Hands: Both  Permission Sought/Granted   Permission granted to share information with : Yes, Verbal Permission Granted      Permission granted to share info w AGENCY: SNFs        Emotional Assessment Appearance:: Appears older than stated age Attitude/Demeanor/Rapport: Engaged Affect (typically observed): Accepting, Pleasant Orientation: : Oriented to Situation, Oriented to  Time, Oriented to Place, Oriented to Self Alcohol / Substance Use: Not Applicable Psych Involvement: No (comment)  Admission diagnosis:  UTI (urinary tract infection) [N39.0] Urinary tract infection associated with catheterization of urinary tract, unspecified indwelling urinary catheter type, initial encounter (HCC) [A35.573U, N39.0] Patient Active Problem List   Diagnosis Date Noted   UTI (urinary tract infection) 10/30/2022   Bipolar 1 disorder (HCC) 10/30/2022   Acute urinary retention 08/01/2022   Fever 08/01/2022   Rhabdomyolysis 07/30/2022   Multiple sclerosis exacerbation (HCC) 07/12/2022   Marijuana use 11/22/2020  History of marijuana use 11/22/2020   Chronic pain syndrome 11/21/2020   Pharmacologic therapy 11/21/2020   Disorder of skeletal system 11/21/2020   Problems influencing health status 11/21/2020   MS (multiple sclerosis) (HCC)    Neuromuscular disorder (HCC)    Bipolar disorder in full remission (HCC) 11/16/2020   Other insomnia 08/11/2020   Generalized anxiety disorder 08/11/2020   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HCC) 12/31/2017   Optic neuritis 08/02/2017   High risk medication use 08/02/2017   Vitamin D deficiency 09/19/2016   Hypercholesterolemia 09/19/2016   Numbness 12/16/2015   Spastic gait 12/16/2015   Urinary hesitancy 12/16/2015   Low back pain with sciatica 09/28/2014   Polysubstance dependence (HCC) 02/09/2014   Combined drug dependence excluding opioids (HCC) 02/09/2014   Relapsing remitting multiple sclerosis (HCC) 05/28/2013   Elevated BP 01/06/2013   Bipolar disorder, unspecified (HCC) 01/03/2013   Bipolar disorder (HCC) 01/03/2013   Right hemiparesis (HCC) 01/02/2013   Multiple  sclerosis flare 01/02/2013   Hemiplegia (HCC) 01/02/2013   PCP:  Quentin Angst, MD Pharmacy:   [facility] [phone] - [place], [place] - [address] AT [facility] [address] [place] [address] Phone: [phone] Fax: [phone]  [facility] [place] - [address] [place] [address] Phone: [phone] Fax: [phone]  [id number] [place], [place] - [address] [place] [place] [address] Phone: [phone] Fax: [phone]     Social Determinants of Health (SDOH) Social History: SDOH Screenings   Food Insecurity: Patient Declined (10/30/2022)  Housing: Low Risk  (10/30/2022)  Transportation Needs: No Transportation Needs (10/30/2022)  Utilities: Not At Risk (10/30/2022)  Depression (PHQ2-9): Low Risk  (10/12/2021)  Recent Concern: Depression (PHQ2-9) - Medium Risk (08/04/2021)  Tobacco Use: High Risk (10/31/2022)   SDOH Interventions:     Readmission Risk Interventions     No data to display

## 2022-11-01 NOTE — Progress Notes (Signed)
PROGRESS NOTE    Bradley Brandt  ZOX:[id number] DOB: 1981/06/05 DOA: 10/30/2022 PCP: Quentin Angst, MD   Brief Narrative:  42 y.o. African-American male with medical history significant for bipolar 1 disorder, relapsing-remitting multiple sclerosis causing right-sided weakness as well as requiring suprapubic catheter (suprapubic catheter placed 2 months ago) presented with catheter malfunction and noted that urine was leaking around it and passing through his penis instead of catheter.  On presentation, he was found to have UTI and was started on IV Rocephin.  He underwent suprapubic catheter upsizing and exchange by IR on 10/31/2022.  Assessment & Plan:   UTI: Present on admission and associated with suprapubic catheter -Continue Rocephin.  Urine culture pending.  Suprapubic catheter malfunction -underwent suprapubic catheter upsizing and exchange by IR on 10/31/2022.  Physical deconditioning -PT recommending SNF placement.  Consult TOC  Relapsing-remitting multiple sclerosis with bladder dysfunction and right-sided weakness -Continue baclofen and Neurontin.  Outpatient follow-up with neurology  Bipolar 1 disorder -Continue Seroquel, duloxetine  Normocytic anemia -Questionable cause.  Hemoglobin stable.  Monitor intermittently.  DVT prophylaxis: Lovenox Code Status: Full Family Communication: None at bedside Disposition Plan: Status is: Observation The patient will require care spanning > 2 midnights and should be moved to inpatient because: Need for IV antibiotics.  Need for SNF placement    Consultants: None  Procedures: None  Antimicrobials: Rocephin from 10/30/2022 onwards   Subjective: Patient seen and examined at bedside.  Sleepy, wakes up very slightly, poor historian.  No fever, seizures, vomiting reported  Objective: Vitals:   10/31/22 0915 10/31/22 1749 10/31/22 2112 11/01/22 0540  BP:  (!) 129/93 134/84 115/84  Pulse: 89 66 79 91  Resp: 13 18 18     Temp:  98.4 F (36.9 C) 98.6 F (37 C) 99.3 F (37.4 C)  TempSrc:   Oral Oral  SpO2: 96% 97% 95% 96%  Weight:      Height:        Intake/Output Summary (Last 24 hours) at 11/01/2022 0934 Last data filed at 11/01/2022 0600 Gross per 24 hour  Intake 300 ml  Output 1050 ml  Net -750 ml   Filed Weights   10/30/22 1634  Weight: 84.4 kg    Examination:  General exam: Appears calm and comfortable.  No distress. On room air. Respiratory system: Bilateral decreased breath sounds at bases, no wheezing Cardiovascular system: S1 & S2 heard, Rate controlled Gastrointestinal system: Abdomen is nondistended, soft and nontender. Normal bowel sounds heard. Extremities: No cyanosis, clubbing; mild lower extremity edema present Central nervous system: Sleepy, wakes up slightly, extremely slow to respond.  Poor historian.  Lower extremity weakness present Skin: No rashes, lesions or ulcers Psychiatry: Extremely flat affect.  Not agitated.  Does not participate in conversation much.   Data Reviewed: I have personally reviewed following labs and imaging studies  CBC: Recent Labs  Lab 10/30/22 1645 10/30/22 2012 10/31/22 0320  WBC 8.7 10.0 8.8  NEUTROABS 4.4  --   --   HGB 13.3 12.2* 12.1*  HCT 41.2 38.3* 37.1*  MCV 90.9 90.5 90.0  PLT 371 336 312   Basic Metabolic Panel: Recent Labs  Lab 10/30/22 1645 10/30/22 2012 10/31/22 0320  NA 140  --  140  K 3.9  --  3.5  CL 104  --  105  CO2 28  --  24  GLUCOSE 98  --  114*  BUN 8  --  13  CREATININE 0.81 0.89 0.80  CALCIUM 9.7  --  9.0   GFR: Estimated Creatinine Clearance: 125.5 mL/min (by C-G formula based on SCr of 0.8 mg/dL). Liver Function Tests: No results for input(s): "AST", "ALT", "ALKPHOS", "BILITOT", "PROT", "ALBUMIN" in the last 168 hours. No results for input(s): "LIPASE", "AMYLASE" in the last 168 hours. No results for input(s): "AMMONIA" in the last 168 hours. Coagulation Profile: No results for input(s):  "INR", "PROTIME" in the last 168 hours. Cardiac Enzymes: No results for input(s): "CKTOTAL", "CKMB", "CKMBINDEX", "TROPONINI" in the last 168 hours. BNP (last 3 results) No results for input(s): "PROBNP" in the last 8760 hours. HbA1C: No results for input(s): "HGBA1C" in the last 72 hours. CBG: Recent Labs  Lab 10/30/22 2117  GLUCAP 129*   Lipid Profile: No results for input(s): "CHOL", "HDL", "LDLCALC", "TRIG", "CHOLHDL", "LDLDIRECT" in the last 72 hours. Thyroid Function Tests: No results for input(s): "TSH", "T4TOTAL", "FREET4", "T3FREE", "THYROIDAB" in the last 72 hours. Anemia Panel: No results for input(s): "VITAMINB12", "FOLATE", "FERRITIN", "TIBC", "IRON", "RETICCTPCT" in the last 72 hours. Sepsis Labs: Recent Labs  Lab 10/30/22 1645  PROCALCITON <0.10    No results found for this or any previous visit (from the past 240 hour(s)).       Radiology Studies: IR Catheter Tube Change  Result Date: 10/31/2022 INDICATION: Catheter malfunction, leaking. EXAM: SUPRAPUBIC CATHETER EXCHANGE and UPSIZE COMPARISON:  CT AP, 09/14/2022. MEDICATIONS: None ANESTHESIA/SEDATION: Moderate (conscious) sedation was employed during this procedure. A total of Versed 2 mg and Fentanyl 75 mcg was administered intravenously. Moderate Sedation Time: 12 minutes. The patient's level of consciousness and vital signs were monitored continuously by radiology nursing throughout the procedure under my direct supervision. CONTRAST:  15 mL Omnipaque 300 FLUOROSCOPY TIME:  Fluoroscopic dose; 2 mGy COMPLICATIONS: None immediate. PROCEDURE: The procedure, risks, benefits, and alternatives were explained to the patient and/or patient's representative . Questions regarding the procedure were encouraged and answered. The patient understands and consents to the procedure. A timeout was performed prior to the initiation of the procedure. The external portion of the existing suprapubic catheter as well as the  surrounding skin were prepped and draped in usual sterile fashion A preprocedural spot fluoroscopic image was obtained of the lower pelvis and existing 12 Fr drainage catheter with end coiled and locked overlying the expected location of the urinary bladder. Contrast injection confirmed occlusion of the pre-existing suprapubic catheter. The external portion of the existing suprapubic catheter was cut and attempted cannulation with a short Amplatz wire. Secondary to occlusion of the catheter was removed and using a 5 Fr Kumpe catheter access into the bladder was performed, then a short 035 inch Amplatz wire was coiled within the urinary bladder. Under intermittent fluoroscopic guidance, the tract was dilated and a new 16 Fr Council tip Foley catheter was placed. Appropriate positioning was confirmed with the injection of a small amount of contrast as well as the efflux of urine. The catheter was connected to a gravity bag. A dressing was placed. The patient tolerated the procedure well without immediate postprocedural complication. FINDINGS: Preprocedural imaging demonstrates unchanged positioning but occluded pre-existing 14 Fr pigtail drainage catheter with end coiled and locked over the expected location of the urinary bladder. After fluoroscopic guided exchange, a new 16 Fr Council tip Foley catheter is appropriately positioned within the urinary bladder. IMPRESSION: 1. Occluded pre-existing pigtail suprapubic drainage catheter. 2. Successful replacement and upsize to a 16 Fr Council tip suprapubic cystostomy tube. RECOMMENDATIONS: Pt to follow up with Urology for routine SP catheter exchanges. Roanna Banning, MD  [facility]ascular [facility] Interventional Radiology Specialists [facility] Radiology Electronically Signed   By: Roanna Banning M.D.   On: 10/31/2022 10:17        Scheduled Meds:  baclofen  20 mg Oral TID   DULoxetine  30 mg Oral Daily   enoxaparin (LOVENOX) injection  40 mg Subcutaneous Q24H   feeding supplement   237 mL Oral TID BM   gabapentin  400 mg Oral TID   lidocaine  1 patch Transdermal Q24H   phenazopyridine  200 mg Oral TID   QUEtiapine  200 mg Oral QHS   Vitamin D (Ergocalciferol)  50,000 Units Oral Q7 days   Continuous Infusions:  [START ON 11/02/2022] cefTRIAXone (ROCEPHIN)  IV            Glade Lloyd, MD [facility] 11/01/2022, 9:34 AM

## 2022-11-01 NOTE — NC FL2 (Signed)
[place] MEDICAID FL2 LEVEL OF CARE FORM     IDENTIFICATION  Patient Name: Bradley Brandt Birthdate: [birth year] Sex: male Admission Date (Current Location): 10/30/2022  [place] and [place] Number:  [name] [id number] L Facility and Address:  [facility], [address] [place], [place] [address]      Provider Number: [id number]  Attending Physician Name and Address:  Glade Lloyd, MD  Relative Name and Phone Number:  Artemio, Greenlee (Mother) [phone]    Current Level of Care: Hospital Recommended Level of Care: Skilled Nursing Facility Prior Approval Number:    Date Approved/Denied:   PASRR Number: pending  Discharge Plan: SNF    Current Diagnoses: Patient Active Problem List   Diagnosis Date Noted   UTI (urinary tract infection) 10/30/2022   Bipolar 1 disorder (HCC) 10/30/2022   Acute urinary retention 08/01/2022   Fever 08/01/2022   Rhabdomyolysis 07/30/2022   Multiple sclerosis exacerbation (HCC) 07/12/2022   Marijuana use 11/22/2020   History of marijuana use 11/22/2020   Chronic pain syndrome 11/21/2020   Pharmacologic therapy 11/21/2020   Disorder of skeletal system 11/21/2020   Problems influencing health status 11/21/2020   MS (multiple sclerosis) (HCC)    Neuromuscular disorder (HCC)    Bipolar disorder in full remission (HCC) 11/16/2020   Other insomnia 08/11/2020   Generalized anxiety disorder 08/11/2020   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HCC) 12/31/2017   Optic neuritis 08/02/2017   High risk medication use 08/02/2017   Vitamin D deficiency 09/19/2016   Hypercholesterolemia 09/19/2016   Numbness 12/16/2015   Spastic gait 12/16/2015   Urinary hesitancy 12/16/2015   Low back pain with sciatica 09/28/2014   Polysubstance dependence (HCC) 02/09/2014   Combined drug dependence excluding opioids (HCC) 02/09/2014   Relapsing remitting multiple sclerosis (HCC) 05/28/2013   Elevated BP 01/06/2013   Bipolar disorder, unspecified  (HCC) 01/03/2013   Bipolar disorder (HCC) 01/03/2013   Right hemiparesis (HCC) 01/02/2013   Multiple sclerosis flare 01/02/2013   Hemiplegia (HCC) 01/02/2013    Orientation RESPIRATION BLADDER Height & Weight     Place, Situation, Time, Self  Normal Incontinent, Indwelling catheter Weight: 186 lb (84.4 kg) Height:  5\' 11"  (180.3 cm)  BEHAVIORAL SYMPTOMS/MOOD NEUROLOGICAL BOWEL NUTRITION STATUS      Incontinent Diet (see d/c summary)  AMBULATORY STATUS COMMUNICATION OF NEEDS Skin   Total Care Verbally Normal                       Personal Care Assistance Level of Assistance  Bathing, Feeding, Dressing Bathing Assistance: Maximum assistance Feeding assistance: Independent Dressing Assistance: Maximum assistance     Functional Limitations Info  Sight, Hearing, Speech Sight Info: Adequate Hearing Info: Adequate Speech Info: Adequate    SPECIAL CARE FACTORS FREQUENCY  PT (By licensed PT), OT (By licensed OT)     PT Frequency: 5x/ week OT Frequency: 5x/ week            Contractures Contractures Info: Not present    Additional Factors Info  Allergies, Code Status, Psychotropic Code Status Info: Full Allergies Info: Lithium Psychotropic Info: DULoxetine (CYMBALTA) DR capsule 30 mg; QUEtiapine (SEROQUEL) tablet 200 mg         Current Medications (11/01/2022):  This is the current hospital active medication list Current Facility-Administered Medications  Medication Dose Route Frequency Provider Last Rate Last Admin   acetaminophen (TYLENOL) tablet 650 mg  650 mg Oral Q6H PRN Mansy, Vernetta Honey, MD  Or   acetaminophen (TYLENOL) suppository 650 mg  650 mg Rectal Q6H PRN Mansy, Jan A, MD       baclofen (LIORESAL) tablet 20 mg  20 mg Oral TID Mansy, Jan A, MD   20 mg at 11/01/22 1033   [START ON 11/02/2022] cefTRIAXone (ROCEPHIN) 1 g in sodium chloride 0.9 % 100 mL IVPB  1 g Intravenous Q24H Julian Reil, Jared M, DO       DULoxetine (CYMBALTA) DR capsule 30 mg  30 mg Oral  Daily Mansy, Jan A, MD   30 mg at 11/01/22 1033   enoxaparin (LOVENOX) injection 40 mg  40 mg Subcutaneous Q24H Ralene Muskrat, PA-C       feeding supplement (ENSURE ENLIVE / ENSURE PLUS) liquid 237 mL  237 mL Oral TID BM Mansy, Jan A, MD   237 mL at 10/31/22 2123   gabapentin (NEURONTIN) capsule 400 mg  400 mg Oral TID Mansy, Jan A, MD   400 mg at 11/01/22 1033   lidocaine (LIDODERM) 5 % 1 patch  1 patch Transdermal Q24H Mansy, Jan A, MD   1 patch at 10/31/22 2123   magnesium hydroxide (MILK OF MAGNESIA) suspension 30 mL  30 mL Oral Daily PRN Mansy, Jan A, MD       ondansetron [facility]) tablet 4 mg  4 mg Oral Q6H PRN Mansy, Jan A, MD       Or   ondansetron [facility]) injection 4 mg  4 mg Intravenous Q6H PRN Mansy, Jan A, MD       phenazopyridine (PYRIDIUM) tablet 200 mg  200 mg Oral TID Mansy, Jan A, MD   200 mg at 11/01/22 1034   QUEtiapine (SEROQUEL) tablet 200 mg  200 mg Oral QHS Mansy, Jan A, MD   200 mg at 10/31/22 2123   traZODone (DESYREL) tablet 25 mg  25 mg Oral QHS PRN Mansy, Jan A, MD       Vitamin D (Ergocalciferol) (DRISDOL) 1.25 MG (50000 UNIT) capsule 50,000 Units  50,000 Units Oral Q7 days Mansy, Jan A, MD   50,000 Units at 11/01/22 1033     Discharge Medications: Please see discharge summary for a list of discharge medications.  Relevant Imaging Results:  Relevant Lab Results:   Additional Information SSN [id number] Bradley Brandt, LCSWA

## 2022-11-02 ENCOUNTER — Observation Stay (HOSPITAL_COMMUNITY): Payer: 59

## 2022-11-02 DIAGNOSIS — T83510A Infection and inflammatory reaction due to cystostomy catheter, initial encounter: Secondary | ICD-10-CM | POA: Diagnosis not present

## 2022-11-02 DIAGNOSIS — N390 Urinary tract infection, site not specified: Secondary | ICD-10-CM | POA: Diagnosis not present

## 2022-11-02 DIAGNOSIS — F319 Bipolar disorder, unspecified: Secondary | ICD-10-CM | POA: Diagnosis not present

## 2022-11-02 DIAGNOSIS — G35 Multiple sclerosis: Secondary | ICD-10-CM | POA: Diagnosis not present

## 2022-11-02 HISTORY — PX: IR CATHETER TUBE CHANGE: IMG717

## 2022-11-02 MED ORDER — FENTANYL CITRATE (PF) 100 MCG/2ML IJ SOLN
INTRAMUSCULAR | Status: AC | PRN
  Administered 2022-11-02 (×2): 50 ug via INTRAVENOUS

## 2022-11-02 MED ORDER — IOHEXOL 300 MG/ML  SOLN
50.0000 mL | Freq: Once | INTRAMUSCULAR | Status: AC | PRN
  Administered 2022-11-02: 15 mL

## 2022-11-02 MED ORDER — FENTANYL CITRATE (PF) 100 MCG/2ML IJ SOLN
INTRAMUSCULAR | Status: AC
  Filled 2022-11-02: qty 2

## 2022-11-02 MED ORDER — LIDOCAINE HCL 1 % IJ SOLN
INTRAMUSCULAR | Status: AC
  Filled 2022-11-02: qty 20

## 2022-11-02 MED ORDER — LIDOCAINE VISCOUS HCL 2 % MT SOLN
OROMUCOSAL | Status: AC
  Filled 2022-11-02: qty 15

## 2022-11-02 NOTE — Procedures (Signed)
Vascular and Interventional Radiology Procedure Note  Patient: Bradley Brandt DOB: 30-Jan-1981 Medical Record Number: [id number] Note Date/Time: 11/02/22 9:05 AM   Performing Physician: Roanna Banning, MD Assistant(s): None   Diagnosis: Catheter malfunction. and Leaking catheter. and upsize of Council catheter   Procedure: SUPRAPUBIC CYSTOSTOMY TUBE UPSIZE and EXCHANGE   Anesthesia: Single agent sedation Complications: None Estimated Blood Loss: None   Findings:  Successful exchange and upsize to a 67F Council suprapubic cystostomy tube under fluoroscopy.   Plan: Pt to follow up with Urology for routine SP catheter exchanges.   See detailed procedure note with images in PACS. The patient tolerated the procedure well without incident or complication and was returned to Floor Bed in stable condition.    Roanna Banning, MD [facility] Interventional Radiology Specialists [facility] Radiology   Pager. [phone] Clinic. [phone]

## 2022-11-02 NOTE — Sedation Documentation (Signed)
States pain during procedure is gone, patient tolerated, in no acute distress

## 2022-11-02 NOTE — Progress Notes (Signed)
Occupational Therapy Treatment Patient Details Name: Bradley Brandt MRN: [id number] DOB: 06/13/1981 Today's Date: 11/02/2022   History of present illness 42 y.o. male presents to [facility] hospital with suprapubic catheter malfunction, found to have UTI. Pt underwent catheter exchange on 5/14. PMH includes MS, bipolar disorder.   OT comments  Pt supine in bed upon OT arrival. Pt agreeable to participation in skilled OT session. During session, OT educated pt in techniques for increased safety and independence with bed mobility, grooming tasks, and UB bathing. Pt demonstrated ability to complete bed mobility with Min to Mod assist and UB ADLs with Set up to Min guard assist with cues for safety, sequencing, and technique. Pt participated well in skilled OT session but was limited by pain. OT notified RN of pt increased pain with sitting EOB. Pt will benefit from continued acute skilled OT services to increase general strength, activity tolerance, balance during functional tasks, and safety and independence with ADLs, bed mobility, and functional transfer/mobility. Discharge plan remains appropriate.    Recommendations for follow up therapy are one component of a multi-disciplinary discharge planning process, led by the attending physician.  Recommendations may be updated based on patient status, additional functional criteria and insurance authorization.    Assistance Recommended at Discharge    Patient can return home with the following  Assist for transportation;Assistance with cooking/housework;A lot of help with bathing/dressing/bathroom;A lot of help with walking and/or transfers   Equipment Recommendations  None recommended by OT    Recommendations for Other Services      Precautions / Restrictions Precautions Precautions: Fall Precaution Comments: R knee flexion contracture ~10 degrees Restrictions Weight Bearing Restrictions: No Other Position/Activity Restrictions: R knee flexion  contracture       Mobility Bed Mobility Overal bed mobility: Needs Assistance Bed Mobility: Supine to Sit, Sit to Supine, Rolling Rolling: Min guard (with cues for hand placement and technique)   Supine to sit: Min assist, HOB elevated Sit to supine: Min assist, Mod assist (Min assist first transfer, Mod assist second transfers secondary to increased pain.)   General bed mobility comments: Pt required Mod assist to scoot up in bed. Functional level with bed mobility limited by pain this day.    Transfers                         Balance Overall balance assessment: Needs assistance Sitting-balance support: Single extremity supported, Bilateral upper extremity supported, Feet supported, No upper extremity supported Sitting balance-Leahy Scale: Poor                                     ADL either performed or assessed with clinical judgement   ADL Overall ADL's : Needs assistance/impaired     Grooming: Wash/dry hands;Wash/dry face;Oral care;Applying deodorant;Min guard;Sitting;Cueing for sequencing;Bed level Grooming Details (indicate cue type and reason): Pt demonstrated ability to complete grooming tasks with Set up but required Min guard assist to maintain sitting balance secondary to pain in sitting. Grooming tasks initiated with pt sitting EOB and completed in bed level secondary to pt pain in sitting.         Upper Body Dressing : Min guard;Sitting;Cueing for sequencing                     General ADL Comments: Pt performed UB dressing and initiated grooming tasks sitting EOB. However, grooming tasks were  completed from bed level secondary to pt increased pain in sitting. Pt tolerated two periods of sitting EOB for approximately 3 minutes first period and <2 minutes second period with attempts to readjust for pt comfort between attempts. Pt pain significantly decreased when positioned supine in bed with HOB elevated.    Extremity/Trunk  Assessment              Vision       Perception     Praxis      Cognition Arousal/Alertness: Awake/alert Behavior During Therapy: WFL for tasks assessed/performed Overall Cognitive Status: Impaired/Different from baseline Area of Impairment: Memory, Awareness, Safety/judgement, Problem solving, Attention                   Current Attention Level: Sustained Memory: Decreased short-term memory, Decreased recall of precautions   Safety/Judgement: Decreased awareness of safety, Decreased awareness of deficits Awareness: Emergent Problem Solving: Slow processing, Decreased initiation, Difficulty sequencing, Requires verbal cues, Requires tactile cues          Exercises      Shoulder Instructions       General Comments Pt participated well in skilled OT session but was limited by pain.    Pertinent Vitals/ Pain       Pain Assessment Pain Assessment: Faces Faces Pain Scale: Hurts whole lot Pain Location: Penis (pain when sitting EOB; significant decrease in pain when supine in bed with HOB elevated) Pain Descriptors / Indicators: Shooting, Sharp, Stabbing, Grimacing, Guarding Pain Intervention(s): Limited activity within patient's tolerance, Monitored during session, Repositioned, Other (comment) (Notified RN)  Home Living                                          Prior Functioning/Environment              Frequency  Min 2X/week        Progress Toward Goals  OT Goals(current goals can now be found in the care plan section)  Progress towards OT goals: Progressing toward goals  Acute Rehab OT Goals Patient Stated Goal: To decrease pain level  Plan Discharge plan remains appropriate    Co-evaluation                 AM-PAC OT "6 Clicks" Daily Activity     Outcome Measure   Help from another person eating meals?: A Little Help from another person taking care of personal grooming?: A Little Help from another person  toileting, which includes using toliet, bedpan, or urinal?: A Lot Help from another person bathing (including washing, rinsing, drying)?: A Lot Help from another person to put on and taking off regular upper body clothing?: A Little Help from another person to put on and taking off regular lower body clothing?: A Lot 6 Click Score: 15    End of Session    OT Visit Diagnosis: Unsteadiness on feet (R26.81);Muscle weakness (generalized) (M62.81);History of falling (Z91.81)   Activity Tolerance Patient limited by pain   Patient Left in bed;with call bell/phone within reach;with bed alarm set   Nurse Communication Mobility status;Other (comment) (Pain)        Time: 4098-1191 OT Time Calculation (min): 25 min  Charges: OT General Charges $OT Visit: 1 Visit OT Treatments $Self Care/Home Management : 23-37 mins  Tihanna Goodson "Orson Eva., OTR/L, MA Acute Rehab [phone]   Lendon Colonel 11/02/2022, 5:04 PM

## 2022-11-02 NOTE — Progress Notes (Signed)
PROGRESS NOTE    Bradley Brandt  BJY:[id number] DOB: 1980/11/14 DOA: 10/30/2022 PCP: Quentin Angst, MD   Brief Narrative:  42 y.o. African-American male with medical history significant for bipolar 1 disorder, relapsing-remitting multiple sclerosis causing right-sided weakness as well as requiring suprapubic catheter (suprapubic catheter placed 2 months ago) presented with catheter malfunction and noted that urine was leaking around it and passing through his penis instead of catheter.  On presentation, he was found to have UTI and was started on IV Rocephin.  He underwent suprapubic catheter upsizing and exchange by IR on 10/31/2022.  Assessment & Plan:   UTI: Present on admission and associated with suprapubic catheter -Continue Rocephin.  Urine culture pending.  Suprapubic catheter malfunction -underwent suprapubic catheter upsizing and exchange by IR on 10/31/2022.  Apparently, he started leaking again on 11/01/2022.  IR has been consulted again.  Follow recommendations.  Physical deconditioning -PT recommending SNF placement.  TOC following.  Relapsing-remitting multiple sclerosis with bladder dysfunction and right-sided weakness -Continue baclofen and Neurontin.  Outpatient follow-up with neurology  Bipolar 1 disorder -Continue Seroquel, duloxetine  Normocytic anemia -Questionable cause.  Hemoglobin stable.  Monitor intermittently.  DVT prophylaxis: Lovenox Code Status: Full Family Communication: None at bedside Disposition Plan: Status is: Observation The patient will require care spanning > 2 midnights and should be moved to inpatient because: Need for IV antibiotics.  Need for SNF placement    Consultants: None  Procedures: None  Antimicrobials: Rocephin from 10/30/2022 onwards   Subjective: Patient seen and examined at bedside.  No seizures, vomiting or fever or agitation reported.   Objective: Vitals:   11/01/22 0956 11/01/22 1746 11/01/22 2044 11/02/22  0524  BP: (!) 108/59 113/64 104/73 109/69  Pulse: 62 69 76 77  Resp: 17 17 18 18   Temp: 98.2 F (36.8 C) 98.3 F (36.8 C) 97.7 F (36.5 C) 98.1 F (36.7 C)  TempSrc:   Oral Oral  SpO2: 98% 99% 96% 95%  Weight:      Height:        Intake/Output Summary (Last 24 hours) at 11/02/2022 0813 Last data filed at 11/02/2022 0300 Gross per 24 hour  Intake 340 ml  Output 450 ml  Net -110 ml    Filed Weights   10/30/22 1634  Weight: 84.4 kg    Examination:  General: Currently on room air.  No acute distress.  Looks chronically ill and ashen.  Slow to respond.  Poor historian.  Flat affect. respiratory: Decreased breath sounds at bases bilaterally with no wheezing CVS: Currently rate controlled; S1-S2 heard  abdominal: Soft, nontender, slightly distended, no organomegaly; bowel sounds are heard.  Suprapubic catheter present. extremities: Trace lower extremity edema; no clubbing.     Data Reviewed: I have personally reviewed following labs and imaging studies  CBC: Recent Labs  Lab 10/30/22 1645 10/30/22 2012 10/31/22 0320  WBC 8.7 10.0 8.8  NEUTROABS 4.4  --   --   HGB 13.3 12.2* 12.1*  HCT 41.2 38.3* 37.1*  MCV 90.9 90.5 90.0  PLT 371 336 312    Basic Metabolic Panel: Recent Labs  Lab 10/30/22 1645 10/30/22 2012 10/31/22 0320  NA 140  --  140  K 3.9  --  3.5  CL 104  --  105  CO2 28  --  24  GLUCOSE 98  --  114*  BUN 8  --  13  CREATININE 0.81 0.89 0.80  CALCIUM 9.7  --  9.0    GFR:  Estimated Creatinine Clearance: 125.5 mL/min (by C-G formula based on SCr of 0.8 mg/dL). Liver Function Tests: No results for input(s): "AST", "ALT", "ALKPHOS", "BILITOT", "PROT", "ALBUMIN" in the last 168 hours. No results for input(s): "LIPASE", "AMYLASE" in the last 168 hours. No results for input(s): "AMMONIA" in the last 168 hours. Coagulation Profile: No results for input(s): "INR", "PROTIME" in the last 168 hours. Cardiac Enzymes: No results for input(s): "CKTOTAL",  "CKMB", "CKMBINDEX", "TROPONINI" in the last 168 hours. BNP (last 3 results) No results for input(s): "PROBNP" in the last 8760 hours. HbA1C: No results for input(s): "HGBA1C" in the last 72 hours. CBG: Recent Labs  Lab 10/30/22 2117  GLUCAP 129*    Lipid Profile: No results for input(s): "CHOL", "HDL", "LDLCALC", "TRIG", "CHOLHDL", "LDLDIRECT" in the last 72 hours. Thyroid Function Tests: No results for input(s): "TSH", "T4TOTAL", "FREET4", "T3FREE", "THYROIDAB" in the last 72 hours. Anemia Panel: No results for input(s): "VITAMINB12", "FOLATE", "FERRITIN", "TIBC", "IRON", "RETICCTPCT" in the last 72 hours. Sepsis Labs: Recent Labs  Lab 10/30/22 1645  PROCALCITON <0.10     No results found for this or any previous visit (from the past 240 hour(s)).       Radiology Studies: IR Catheter Tube Change  Result Date: 10/31/2022 INDICATION: Catheter malfunction, leaking. EXAM: SUPRAPUBIC CATHETER EXCHANGE and UPSIZE COMPARISON:  CT AP, 09/14/2022. MEDICATIONS: None ANESTHESIA/SEDATION: Moderate (conscious) sedation was employed during this procedure. A total of Versed 2 mg and Fentanyl 75 mcg was administered intravenously. Moderate Sedation Time: 12 minutes. The patient's level of consciousness and vital signs were monitored continuously by radiology nursing throughout the procedure under my direct supervision. CONTRAST:  15 mL Omnipaque 300 FLUOROSCOPY TIME:  Fluoroscopic dose; 2 mGy COMPLICATIONS: None immediate. PROCEDURE: The procedure, risks, benefits, and alternatives were explained to the patient and/or patient's representative . Questions regarding the procedure were encouraged and answered. The patient understands and consents to the procedure. A timeout was performed prior to the initiation of the procedure. The external portion of the existing suprapubic catheter as well as the surrounding skin were prepped and draped in usual sterile fashion A preprocedural spot fluoroscopic  image was obtained of the lower pelvis and existing 12 Fr drainage catheter with end coiled and locked overlying the expected location of the urinary bladder. Contrast injection confirmed occlusion of the pre-existing suprapubic catheter. The external portion of the existing suprapubic catheter was cut and attempted cannulation with a short Amplatz wire. Secondary to occlusion of the catheter was removed and using a 5 Fr Kumpe catheter access into the bladder was performed, then a short 035 inch Amplatz wire was coiled within the urinary bladder. Under intermittent fluoroscopic guidance, the tract was dilated and a new 16 Fr Council tip Foley catheter was placed. Appropriate positioning was confirmed with the injection of a small amount of contrast as well as the efflux of urine. The catheter was connected to a gravity bag. A dressing was placed. The patient tolerated the procedure well without immediate postprocedural complication. FINDINGS: Preprocedural imaging demonstrates unchanged positioning but occluded pre-existing 14 Fr pigtail drainage catheter with end coiled and locked over the expected location of the urinary bladder. After fluoroscopic guided exchange, a new 16 Fr Council tip Foley catheter is appropriately positioned within the urinary bladder. IMPRESSION: 1. Occluded pre-existing pigtail suprapubic drainage catheter. 2. Successful replacement and upsize to a 16 Fr Council tip suprapubic cystostomy tube. RECOMMENDATIONS: Pt to follow up with Urology for routine SP catheter exchanges. Roanna Banning, MD [facility] Electronically Signed   By: Roanna Banning M.D.   On: 10/31/2022 10:17        Scheduled Meds:  baclofen  20 mg Oral TID   DULoxetine  30 mg Oral Daily   enoxaparin (LOVENOX) injection  40 mg Subcutaneous Q24H   feeding supplement  237 mL Oral TID BM   gabapentin  400 mg Oral TID   lidocaine  1 patch Transdermal Q24H    phenazopyridine  200 mg Oral TID   QUEtiapine  200 mg Oral QHS   Vitamin D (Ergocalciferol)  50,000 Units Oral Q7 days   Continuous Infusions:  cefTRIAXone (ROCEPHIN)  IV 1 g (11/02/22 0000)          Glade Lloyd, MD [facility] 11/02/2022, 8:13 AM

## 2022-11-02 NOTE — TOC Progression Note (Addendum)
Transition of Care [facility]) - Initial/Assessment Note    Patient Details  Name: Bradley Brandt MRN: [id number] Date of Birth: Jul 11, 1980  [facility] CM/SW Contact:    Ralene Bathe, LCSWA Phone Number: 11/02/2022, 9:07 AM  Clinical Narrative:                 No SNF bed offers at this time.   Addendum 10:20- LCSW contacted Christine with [facility]  The facility can accept the patient.  LCSW presented bed offer to patient.  The patient accepted the bed offer at [facility].  Facility notified and PASRR documents uploaded.  Addendum 11:28- PASRR number received. [id number]  TOC will continue to follow.   Expected Discharge Plan: Skilled Nursing Facility Barriers to Discharge: Continued Medical Work up, English as a second language teacher, SNF Pending bed offer   Patient Goals and CMS Choice Patient states their goals for this hospitalization and ongoing recovery are:: To go to a SNF for rehab CMS Medicare.gov Compare Post Acute Care list provided to:: Patient Choice offered to / list presented to : Patient      Expected Discharge Plan and Services In-house Referral: Clinical Social Work     Living arrangements for the past 2 months: Single Family Home Expected Discharge Date: 10/31/22                                    Prior Living Arrangements/Services Living arrangements for the past 2 months: Single Family Home Lives with:: Self Patient language and need for interpreter reviewed:: Yes Do you feel safe going back to the place where you live?: Yes      Need for Family Participation in Patient Care: Yes (Comment) Care giver support system in place?: No (comment)   Criminal Activity/Legal Involvement Pertinent to Current Situation/Hospitalization: No - Comment as needed  Activities of Daily Living Home Assistive Devices/Equipment: Walker (specify type) ADL Screening (condition at time of admission) Patient's cognitive ability  adequate to safely complete daily activities?: Yes Is the patient deaf or have difficulty hearing?: No Does the patient have difficulty seeing, even when wearing glasses/contacts?: No Does the patient have difficulty concentrating, remembering, or making decisions?: No Patient able to express need for assistance with ADLs?: Yes Does the patient have difficulty dressing or bathing?: Yes Independently performs ADLs?: No Does the patient have difficulty walking or climbing stairs?: Yes Weakness of Legs: Both Weakness of Arms/Hands: Both  Permission Sought/Granted   Permission granted to share information with : Yes, Verbal Permission Granted     Permission granted to share info w AGENCY: SNFs        Emotional Assessment Appearance:: Appears older than stated age Attitude/Demeanor/Rapport: Engaged Affect (typically observed): Accepting, Pleasant Orientation: : Oriented to Situation, Oriented to  Time, Oriented to Place, Oriented to Self Alcohol / Substance Use: Not Applicable Psych Involvement: No (comment)  Admission diagnosis:  UTI (urinary tract infection) [N39.0] Urinary tract infection associated with catheterization of urinary tract, unspecified indwelling urinary catheter type, initial encounter (HCC) [W29.562Z, N39.0] Patient Active Problem List   Diagnosis Date Noted   UTI (urinary tract infection) 10/30/2022   Bipolar 1 disorder (HCC) 10/30/2022   Acute urinary retention 08/01/2022   Fever 08/01/2022   Rhabdomyolysis 07/30/2022   Multiple sclerosis exacerbation (HCC) 07/12/2022   Marijuana use 11/22/2020   History of marijuana use 11/22/2020   Chronic pain syndrome 11/21/2020   Pharmacologic  therapy 11/21/2020   Disorder of skeletal system 11/21/2020   Problems influencing health status 11/21/2020   MS (multiple sclerosis) (HCC)    Neuromuscular disorder (HCC)    Bipolar disorder in full remission (HCC) 11/16/2020   Other insomnia 08/11/2020   Generalized anxiety  disorder 08/11/2020   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HCC) 12/31/2017   Optic neuritis 08/02/2017   High risk medication use 08/02/2017   Vitamin D deficiency 09/19/2016   Hypercholesterolemia 09/19/2016   Numbness 12/16/2015   Spastic gait 12/16/2015   Urinary hesitancy 12/16/2015   Low back pain with sciatica 09/28/2014   Polysubstance dependence (HCC) 02/09/2014   Combined drug dependence excluding opioids (HCC) 02/09/2014   Relapsing remitting multiple sclerosis (HCC) 05/28/2013   Elevated BP 01/06/2013   Bipolar disorder, unspecified (HCC) 01/03/2013   Bipolar disorder (HCC) 01/03/2013   Right hemiparesis (HCC) 01/02/2013   Multiple sclerosis flare 01/02/2013   Hemiplegia (HCC) 01/02/2013   PCP:  Quentin Angst, MD Pharmacy:   [facility] [phone] - [place], [place] - [address] AT [facility] OF [address] [place] [address] Phone: [phone] Fax: [phone]  [facility] [place] - [address] [place] [address] Phone: [phone] Fax: [phone]  [id number] [place], [place] - [address] [place] [address] Phone: [phone] Fax: [phone]     Social Determinants of Health (SDOH) Social History: SDOH Screenings   Food Insecurity: Patient Declined (10/30/2022)  Housing: Low Risk  (10/30/2022)  Transportation Needs: No Transportation Needs (10/30/2022)  Utilities: Not At Risk (10/30/2022)  Depression (PHQ2-9): Low Risk  (10/12/2021)  Recent Concern: Depression (PHQ2-9) - Medium Risk (08/04/2021)  Tobacco Use: High Risk (10/31/2022)   SDOH Interventions:     Readmission Risk Interventions     No data to display

## 2022-11-03 DIAGNOSIS — G8191 Hemiplegia, unspecified affecting right dominant side: Secondary | ICD-10-CM | POA: Diagnosis present

## 2022-11-03 DIAGNOSIS — G35 Multiple sclerosis: Secondary | ICD-10-CM | POA: Diagnosis present

## 2022-11-03 DIAGNOSIS — F319 Bipolar disorder, unspecified: Secondary | ICD-10-CM | POA: Diagnosis present

## 2022-11-03 DIAGNOSIS — Z6825 Body mass index (BMI) 25.0-25.9, adult: Secondary | ICD-10-CM | POA: Diagnosis not present

## 2022-11-03 DIAGNOSIS — N390 Urinary tract infection, site not specified: Secondary | ICD-10-CM | POA: Diagnosis present

## 2022-11-03 DIAGNOSIS — Z7401 Bed confinement status: Secondary | ICD-10-CM | POA: Diagnosis not present

## 2022-11-03 DIAGNOSIS — Z79899 Other long term (current) drug therapy: Secondary | ICD-10-CM | POA: Diagnosis not present

## 2022-11-03 DIAGNOSIS — Z888 Allergy status to other drugs, medicaments and biological substances status: Secondary | ICD-10-CM | POA: Diagnosis not present

## 2022-11-03 DIAGNOSIS — T83510A Infection and inflammatory reaction due to cystostomy catheter, initial encounter: Secondary | ICD-10-CM | POA: Diagnosis not present

## 2022-11-03 DIAGNOSIS — T83518A Infection and inflammatory reaction due to other urinary catheter, initial encounter: Secondary | ICD-10-CM | POA: Diagnosis present

## 2022-11-03 DIAGNOSIS — T83090A Other mechanical complication of cystostomy catheter, initial encounter: Secondary | ICD-10-CM | POA: Diagnosis present

## 2022-11-03 DIAGNOSIS — M549 Dorsalgia, unspecified: Secondary | ICD-10-CM | POA: Diagnosis present

## 2022-11-03 DIAGNOSIS — F17210 Nicotine dependence, cigarettes, uncomplicated: Secondary | ICD-10-CM | POA: Diagnosis present

## 2022-11-03 DIAGNOSIS — Z87440 Personal history of urinary (tract) infections: Secondary | ICD-10-CM | POA: Diagnosis not present

## 2022-11-03 DIAGNOSIS — D649 Anemia, unspecified: Secondary | ICD-10-CM | POA: Diagnosis present

## 2022-11-03 DIAGNOSIS — Y738 Miscellaneous gastroenterology and urology devices associated with adverse incidents, not elsewhere classified: Secondary | ICD-10-CM | POA: Diagnosis present

## 2022-11-03 MED ORDER — OXYCODONE HCL 5 MG PO TABS
10.0000 mg | ORAL_TABLET | Freq: Once | ORAL | Status: AC
  Administered 2022-11-03: 10 mg via ORAL
  Filled 2022-11-03: qty 2

## 2022-11-03 NOTE — Progress Notes (Signed)
Physical Therapy Treatment Patient Details Name: Bradley Brandt MRN: [id number] DOB: [birth year] Today's Date: 11/03/2022   History of Present Illness 42 y.o. male presents to [facility] hospital with suprapubic catheter malfunction, found to have UTI. Pt underwent catheter exchange on 5/14. PMH includes MS, bipolar disorder.    PT Comments    Pt greeted resting in bed and agreeable to session with continued progress towards acute goals. Pt able to come to sitting EOB with min guard assist for safety with pt demonstrating fair sitting balance with feet supported. Pt continues to require mod A to power up to stand with RW support with pt demonstrating strong posterior lean needing up to max A to maintain balance and unable to shift weight anterior to correct. With use of stedy pt able to rise to standing with min A and shift weight over toes with multimodal cues. Pt able to maintain upright posture without assist for balance in stedy frame ~5-10 seconds before max fatigue. Pt agreeable to time up in chair at end of session. Current plan remains appropriate to address deficits and maximize functional independence and decrease caregiver burden. Pt continues to benefit from skilled PT services to progress toward functional mobility goals.    Recommendations for follow up therapy are one component of a multi-disciplinary discharge planning process, led by the attending physician.  Recommendations may be updated based on patient status, additional functional criteria and insurance authorization.  Follow Up Recommendations  Can patient physically be transported by private vehicle: No    Assistance Recommended at Discharge Frequent or constant Supervision/Assistance  Patient can return home with the following Two people to help with walking and/or transfers;A lot of help with bathing/dressing/bathroom;Assistance with cooking/housework;Help with stairs or ramp for entrance;Assist for transportation   Equipment  Recommendations   (defer to post-acute setting)    Recommendations for Other Services       Precautions / Restrictions Precautions Precautions: Fall Precaution Comments: R knee flexion contracture ~10 degrees Restrictions Weight Bearing Restrictions: No Other Position/Activity Restrictions: R knee flexion contracture     Mobility  Bed Mobility Overal bed mobility: Needs Assistance Bed Mobility: Supine to Sit     Supine to sit: HOB elevated, Min guard     General bed mobility comments: min gaurd for safety    Transfers Overall transfer level: Needs assistance Equipment used: Rolling walker (2 wheels), 1 person hand held assist Transfers: Sit to/from Stand Sit to Stand: Mod assist, Min assist           General transfer comment: strong posterior lean, unable to extend hips due to R knee flexion contracture, min A to stand with stedy, mod A with RW supprot Transfer via Lift Equipment: Stedy  Ambulation/Gait               General Gait Details: unable   Social research officer, government Rankin (Stroke Patients Only)       Balance Overall balance assessment: Needs assistance Sitting-balance support: Single extremity supported, Bilateral upper extremity supported, Feet supported, No upper extremity supported Sitting balance-Leahy Scale: Poor     Standing balance support: Bilateral upper extremity supported, Reliant on assistive device for balance Standing balance-Leahy Scale: Poor Standing balance comment: modA, posterior lean, improved with stedy frame and cues to shift weight anterior  Cognition Arousal/Alertness: Awake/alert Behavior During Therapy: WFL for tasks assessed/performed Overall Cognitive Status: Impaired/Different from baseline Area of Impairment: Memory, Awareness, Safety/judgement, Problem solving, Attention                   Current Attention Level:  Sustained Memory: Decreased short-term memory, Decreased recall of precautions   Safety/Judgement: Decreased awareness of safety, Decreased awareness of deficits Awareness: Emergent Problem Solving: Slow processing, Decreased initiation, Difficulty sequencing, Requires verbal cues, Requires tactile cues General Comments: Patient is oriented to hospital, current situation and is following commands.        Exercises Other Exercises Other Exercises: weight shifting in standing    General Comments General comments (skin integrity, edema, etc.): pt pleasant and motivated to progress      Pertinent Vitals/Pain Pain Assessment Pain Assessment: Faces Faces Pain Scale: Hurts a little bit Pain Location: generalized Pain Descriptors / Indicators: Grimacing, Guarding Pain Intervention(s): Limited activity within patient's tolerance, Monitored during session, Repositioned    Home Living                          Prior Function            PT Goals (current goals can now be found in the care plan section) Acute Rehab PT Goals Patient Stated Goal: to walk PT Goal Formulation: With patient/family Time For Goal Achievement: 11/14/22 Progress towards PT goals: Progressing toward goals    Frequency    Min 2X/week      PT Plan      Co-evaluation              AM-PAC PT "6 Clicks" Mobility   Outcome Measure  Help needed turning from your back to your side while in a flat bed without using bedrails?: A Little Help needed moving from lying on your back to sitting on the side of a flat bed without using bedrails?: A Little Help needed moving to and from a bed to a chair (including a wheelchair)?: A Lot Help needed standing up from a chair using your arms (e.g., wheelchair or bedside chair)?: A Lot Help needed to walk in hospital room?: Total Help needed climbing 3-5 steps with a railing? : Total 6 Click Score: 12    End of Session Equipment Utilized During  Treatment: Gait belt Activity Tolerance: Patient tolerated treatment well Patient left: in chair;with call bell/phone within reach Nurse Communication: Mobility status;Need for lift equipment (stedy) PT Visit Diagnosis: Other abnormalities of gait and mobility (R26.89);Muscle weakness (generalized) (M62.81);Other symptoms and signs involving the nervous system (O13.086)     Time: 5784-6962 PT Time Calculation (min) (ACUTE ONLY): 24 min  Charges:  $Therapeutic Activity: 23-37 mins                     Vicenta Olds R. PTA Acute Rehabilitation Services Office: [phone]   Catalina Antigua 11/03/2022, 4:32 PM

## 2022-11-03 NOTE — Progress Notes (Signed)
PROGRESS NOTE    Bradley Brandt  ZOX:[id number] DOB: 1981-02-26 DOA: 10/30/2022 PCP: Quentin Angst, MD   Brief Narrative:  42 y.o. African-American male with medical history significant for bipolar 1 disorder, relapsing-remitting multiple sclerosis causing right-sided weakness as well as requiring suprapubic catheter (suprapubic catheter placed 2 months ago) presented with catheter malfunction and noted that urine was leaking around it and passing through his penis instead of catheter.  On presentation, he was found to have UTI and was started on IV Rocephin.  He underwent suprapubic catheter upsizing and exchange by IR on 10/31/2022.  Assessment & Plan:   UTI: Present on admission and associated with suprapubic catheter -Continue Rocephin.  Urine culture growing gram-negative rods: Identification and sensitivities pending. Suprapubic catheter malfunction -underwent suprapubic catheter upsizing and exchange by IR on 10/31/2022.  Apparently, he started leaking again on 11/01/2022.  IR was reconsulted: Underwent upsizing exchange again on 11/02/2022  Physical deconditioning -PT recommending SNF placement.  TOC following.  Relapsing-remitting multiple sclerosis with bladder dysfunction and right-sided weakness -Continue baclofen and Neurontin.  Outpatient follow-up with neurology  Bipolar 1 disorder -Continue Seroquel, duloxetine  Normocytic anemia -Questionable cause.  Hemoglobin stable.  Monitor intermittently.  DVT prophylaxis: Lovenox Code Status: Full Family Communication: None at bedside Disposition Plan: Status is:  inpatient because: Need for IV antibiotics.  Need for SNF placement    Consultants: IR  Procedures: As above  Antimicrobials: Rocephin from 10/30/2022 onwards   Subjective: Patient seen and examined at bedside.  No fever, vomiting, seizures or agitation reported.   Objective: Vitals:   11/02/22 1637 11/02/22 2133 11/03/22 0519 11/03/22 0852  BP:  129/77 135/83 (!) 98/59 126/66  Pulse: 85 76 68 71  Resp: 18 18 14 17   Temp: 98.4 F (36.9 C) 98.2 F (36.8 C) 98 F (36.7 C) 97.9 F (36.6 C)  TempSrc:  Oral  Oral  SpO2: 95% 100% 96% 95%  Weight:      Height:        Intake/Output Summary (Last 24 hours) at 11/03/2022 1116 Last data filed at 11/03/2022 0601 Gross per 24 hour  Intake 820 ml  Output 3650 ml  Net -2830 ml    Filed Weights   10/30/22 1634  Weight: 84.4 kg    Examination:  General: No distress currently.  On room air.  Looks chronically ill and deconditioned.  Slow to respond.  Poor historian.  Flat affect. respiratory: Bilateral decreased breath sounds at bases with scattered crackles  CVS: S1-S2 heard; rate controlled currently  abdominal: Soft, nontender, distended mildly, no organomegaly; bowel sounds heard normally.  Suprapubic catheter present. extremities: No clubbing; mild lower extremity edema present   Data Reviewed: I have personally reviewed following labs and imaging studies  CBC: Recent Labs  Lab 10/30/22 1645 10/30/22 2012 10/31/22 0320  WBC 8.7 10.0 8.8  NEUTROABS 4.4  --   --   HGB 13.3 12.2* 12.1*  HCT 41.2 38.3* 37.1*  MCV 90.9 90.5 90.0  PLT 371 336 312    Basic Metabolic Panel: Recent Labs  Lab 10/30/22 1645 10/30/22 2012 10/31/22 0320  NA 140  --  140  K 3.9  --  3.5  CL 104  --  105  CO2 28  --  24  GLUCOSE 98  --  114*  BUN 8  --  13  CREATININE 0.81 0.89 0.80  CALCIUM 9.7  --  9.0    GFR: Estimated Creatinine Clearance: 125.5 mL/min (by C-G formula based  on SCr of 0.8 mg/dL). Liver Function Tests: No results for input(s): "AST", "ALT", "ALKPHOS", "BILITOT", "PROT", "ALBUMIN" in the last 168 hours. No results for input(s): "LIPASE", "AMYLASE" in the last 168 hours. No results for input(s): "AMMONIA" in the last 168 hours. Coagulation Profile: No results for input(s): "INR", "PROTIME" in the last 168 hours. Cardiac Enzymes: No results for input(s):  "CKTOTAL", "CKMB", "CKMBINDEX", "TROPONINI" in the last 168 hours. BNP (last 3 results) No results for input(s): "PROBNP" in the last 8760 hours. HbA1C: No results for input(s): "HGBA1C" in the last 72 hours. CBG: Recent Labs  Lab 10/30/22 2117  GLUCAP 129*    Lipid Profile: No results for input(s): "CHOL", "HDL", "LDLCALC", "TRIG", "CHOLHDL", "LDLDIRECT" in the last 72 hours. Thyroid Function Tests: No results for input(s): "TSH", "T4TOTAL", "FREET4", "T3FREE", "THYROIDAB" in the last 72 hours. Anemia Panel: No results for input(s): "VITAMINB12", "FOLATE", "FERRITIN", "TIBC", "IRON", "RETICCTPCT" in the last 72 hours. Sepsis Labs: Recent Labs  Lab 10/30/22 1645  PROCALCITON <0.10     Recent Results (from the past 240 hour(s))  Urine Culture (for pregnant, neutropenic or urologic patients or patients with an indwelling urinary catheter)     Status: Abnormal (Preliminary result)   Collection Time: 11/02/22 10:00 AM   Specimen: Urine, Suprapubic  Result Value Ref Range Status   Specimen Description URINE, SUPRAPUBIC  Final   Special Requests   Final    NONE Performed at [facility], [address], [place], [place] [address]    Culture 60,000 COLONIES/mL GRAM NEGATIVE RODS (A)  Final   Report Status PENDING  Incomplete         Radiology Studies: IR Catheter Tube Change  Result Date: 11/03/2022 INDICATION: Pericatheter leakage. EXAM: SUPRAPUBIC CATHETER EXCHANGE COMPARISON:  IR fluoroscopy, 10/31/2022.  CT AP, 09/14/2022. MEDICATIONS: 10 mL lidocaine 1% ANESTHESIA/SEDATION: Local anesthetic and single agent sedation was employed during this procedure. A total of fentanyl 100 mcg was administered intravenously. The patient's level of consciousness and vital signs were monitored continuously by radiology nursing throughout the procedure under my direct supervision. CONTRAST:  15 mL Omnipaque 300 FLUOROSCOPY TIME:  Fluoroscopic dose; 1 mGy COMPLICATIONS: None  immediate. PROCEDURE: The procedure, risks, benefits, and alternatives were explained to the patient. Questions regarding the procedure were encouraged and answered. The patient understands and consents to the procedure. A timeout was performed prior to the initiation of the procedure. The external portion of the existing suprapubic catheter as well as the surrounding skin were prepped and draped in usual sterile fashion A preprocedural spot fluoroscopic image was obtained of the lower pelvis and existing 16 Fr Council tip suprapubic catheter with end locked overlying the expected location of the urinary bladder. Contrast injection confirmed appropriate positioning and functionality existing suprapubic catheter. The external portion of the existing suprapubic catheter was cut and cannulated with a short Amplatz wire which was coiled within the urinary bladder. Under intermittent fluoroscopic guidance, the existing drainage catheter was exchanged for a new 20 Fr Council tip suprapubic catheter Appropriate positioning was confirmed with the injection of a small amount of contrast as well as the efflux of urine. The catheter was connected to a gravity bag. A dressing was placed. The patient tolerated the procedure well without immediate postprocedural complication. FINDINGS: *Preprocedural imaging demonstrates unchanged positioning of the pre-existing drainage catheter with end locked over the expected location of the urinary bladder. *Contrast injection confirms appropriate positioning and functionality of the existing percutaneous drainage catheter. *After fluoroscopic guided exchange,  a new 20 Fr Council tip Foley catheter is appropriately positioned within the urinary bladder. IMPRESSION: Successful exchange and upsize to a 20 Fr Council tip suprapubic cystostomy tube. RECOMMENDATIONS: Patient to follow-up with urology for routine SP catheter exchanges. Roanna Banning, MD Vascular and Interventional Radiology  Special[facility] Radiology Electronically Signed   By: Roanna Banning M.D.   On: 11/03/2022 09:25        Scheduled Meds:  baclofen  20 mg Oral TID   DULoxetine  30 mg Oral Daily   enoxaparin (LOVENOX) injection  40 mg Subcutaneous Q24H   feeding supplement  237 mL Oral TID BM   gabapentin  400 mg Oral TID   lidocaine  1 patch Transdermal Q24H   phenazopyridine  200 mg Oral TID   QUEtiapine  200 mg Oral QHS   Vitamin D (Ergocalciferol)  50,000 Units Oral Q7 days   Continuous Infusions:  cefTRIAXone (ROCEPHIN)  IV 1 g (11/03/22 0040)          Glade Lloyd, MD [facility] 11/03/2022, 11:16 AM

## 2022-11-03 NOTE — Progress Notes (Signed)
[facility] night cross cover note:   I was notified by RN at the patient's request for analgesic intervention for his back discomfort, noting that prn acetaminophen is typically suboptimal in managing his back pain.  The patient specifically suggests oxycodone as an alternative.  I subsequently placed order for oxycodone 10 mg p.o. x 1 dose now to further address his back pain.    Newton Pigg, DO Hospitalist

## 2022-11-03 NOTE — TOC Progression Note (Signed)
Transition of Care [facility]) - Initial/Assessment Note    Patient Details  Name: Bradley MATTHEY MRN: [id number] Date of Birth: 1980-11-26  [facility]) CM/SW Contact:    Ralene Bathe, LCSWA Phone Number: 11/03/2022, 12:27 PM  Clinical Narrative:                 LCSW contacted Christine with [facility] for [facility].  Insurance authorization is pending.  TOC following.   Expected Discharge Plan: Skilled Nursing Facility Barriers to Discharge: Continued Medical Work up, English as a second language teacher, SNF Pending bed offer   Patient Goals and CMS Choice Patient states their goals for this hospitalization and ongoing recovery are:: To go to a SNF for rehab CMS Medicare.gov Compare Post Acute Care list provided to:: Patient Choice offered to / list presented to : Patient      Expected Discharge Plan and Services In-house Referral: Clinical Social Work     Living arrangements for the past 2 months: Single Family Home Expected Discharge Date: 10/31/22                                    Prior Living Arrangements/Services Living arrangements for the past 2 months: Single Family Home Lives with:: Self Patient language and need for interpreter reviewed:: Yes Do you feel safe going back to the place where you live?: Yes      Need for Family Participation in Patient Care: Yes (Comment) Care giver support system in place?: No (comment)   Criminal Activity/Legal Involvement Pertinent to Current Situation/Hospitalization: No - Comment as needed  Activities of Daily Living Home Assistive Devices/Equipment: Walker (specify type) ADL Screening (condition at time of admission) Patient's cognitive ability adequate to safely complete daily activities?: Yes Is the patient deaf or have difficulty hearing?: No Does the patient have difficulty seeing, even when wearing glasses/contacts?: No Does the patient have difficulty concentrating, remembering, or making  decisions?: No Patient able to express need for assistance with ADLs?: Yes Does the patient have difficulty dressing or bathing?: Yes Independently performs ADLs?: No Does the patient have difficulty walking or climbing stairs?: Yes Weakness of Legs: Both Weakness of Arms/Hands: Both  Permission Sought/Granted   Permission granted to share information with : Yes, Verbal Permission Granted     Permission granted to share info w AGENCY: SNFs        Emotional Assessment Appearance:: Appears older than stated age Attitude/Demeanor/Rapport: Engaged Affect (typically observed): Accepting, Pleasant Orientation: : Oriented to Situation, Oriented to  Time, Oriented to Place, Oriented to Self Alcohol / Substance Use: Not Applicable Psych Involvement: No (comment)  Admission diagnosis:  UTI (urinary tract infection) [N39.0] Urinary tract infection associated with catheterization of urinary tract, unspecified indwelling urinary catheter type, initial encounter (HCC) [W09.811B, N39.0] Patient Active Problem List   Diagnosis Date Noted   UTI (urinary tract infection) 10/30/2022   Bipolar 1 disorder (HCC) 10/30/2022   Acute urinary retention 08/01/2022   Fever 08/01/2022   Rhabdomyolysis 07/30/2022   Multiple sclerosis exacerbation (HCC) 07/12/2022   Marijuana use 11/22/2020   History of marijuana use 11/22/2020   Chronic pain syndrome 11/21/2020   Pharmacologic therapy 11/21/2020   Disorder of skeletal system 11/21/2020   Problems influencing health status 11/21/2020   MS (multiple sclerosis) (HCC)    Neuromuscular disorder (HCC)    Bipolar disorder in full remission (HCC) 11/16/2020   Other insomnia 08/11/2020   Generalized anxiety  disorder 08/11/2020   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HCC) 12/31/2017   Optic neuritis 08/02/2017   High risk medication use 08/02/2017   Vitamin D deficiency 09/19/2016   Hypercholesterolemia 09/19/2016   Numbness 12/16/2015   Spastic gait 12/16/2015    Urinary hesitancy 12/16/2015   Low back pain with sciatica 09/28/2014   Polysubstance dependence (HCC) 02/09/2014   Combined drug dependence excluding opioids (HCC) 02/09/2014   Relapsing remitting multiple sclerosis (HCC) 05/28/2013   Elevated BP 01/06/2013   Bipolar disorder, unspecified (HCC) 01/03/2013   Bipolar disorder (HCC) 01/03/2013   Right hemiparesis (HCC) 01/02/2013   Multiple sclerosis flare 01/02/2013   Hemiplegia (HCC) 01/02/2013   PCP:  Quentin Angst, MD Pharmacy:   [facility] [phone] - [place], [place] - [address] AT [facility] OF [address] [place] [address] Phone: [phone] Fax: [phone]  [facility] - [place], [place] - [address] [place] [address] Phone: [phone] Fax: [phone]  [facility] [address] [place] - [address] [address] Phone: [phone] Fax: [phone]     Social Determinants of Health (SDOH) Social History: SDOH Screenings   Food Insecurity: Patient Declined (10/30/2022)  Housing: Low Risk  (10/30/2022)  Transportation Needs: No Transportation Needs (10/30/2022)  Utilities: Not At Risk (10/30/2022)  Depression (PHQ2-9): Low Risk  (10/12/2021)  Recent Concern: Depression (PHQ2-9) - Medium Risk (08/04/2021)  Tobacco Use: High Risk (11/03/2022)   SDOH Interventions:     Readmission Risk Interventions     No data to display

## 2022-11-03 NOTE — Progress Notes (Signed)
Pt c/o back pain at 6/10 and is asking for something other than Tylenol. RN messaged MD on call.    Devonne Doughty Armonie Mettler

## 2022-11-04 DIAGNOSIS — T83510A Infection and inflammatory reaction due to cystostomy catheter, initial encounter: Secondary | ICD-10-CM | POA: Diagnosis not present

## 2022-11-04 DIAGNOSIS — N390 Urinary tract infection, site not specified: Secondary | ICD-10-CM | POA: Diagnosis not present

## 2022-11-04 DIAGNOSIS — G35 Multiple sclerosis: Secondary | ICD-10-CM | POA: Diagnosis not present

## 2022-11-04 DIAGNOSIS — F319 Bipolar disorder, unspecified: Secondary | ICD-10-CM | POA: Diagnosis not present

## 2022-11-04 LAB — URINE CULTURE: Culture: 60000 — AB

## 2022-11-04 MED ORDER — CIPROFLOXACIN HCL 500 MG PO TABS: 500.0000 mg | ORAL_TABLET | Freq: Two times a day (BID) | ORAL | 0 refills | Status: AC

## 2022-11-04 NOTE — Discharge Summary (Signed)
Physician Discharge Summary  Bradley Brandt:[id number] DOB: Jan 17, 1981 DOA: 10/30/2022  PCP: Quentin Angst, MD  Admit date: 10/30/2022 Discharge date: 11/04/2022  Admitted From: Home Disposition: SNF  Recommendations for Outpatient Follow-up:  Follow up with SNF provider at earliest convenience Outpatient follow-up with urology for management of suprapubic catheter Follow up in ED if symptoms worsen or new appear   Home Health: No Equipment/Devices: None  Discharge Condition: Stable CODE STATUS: Full Diet recommendation: Regular  Brief/Interim Summary: 42 y.o. African-American male with medical history significant for bipolar 1 disorder, relapsing-remitting multiple sclerosis causing right-sided weakness as well as requiring suprapubic catheter (suprapubic catheter placed 2 months ago) presented with catheter malfunction and noted that urine was leaking around it and passing through his penis instead of catheter.  On presentation, he was found to have UTI and was started on IV Rocephin.  He underwent suprapubic catheter upsizing and exchange by IR on 10/31/2022 and again on 11/02/2022.  IR recommended outpatient follow-up with urology.  Urine cultures have finalized.  PT recommended SNF placement.  He will be discharged to SNF today if bed is available on 2 more days of oral ciprofloxacin.  Discharge Diagnoses:   UTI: Present on admission and associated with suprapubic catheter Suprapubic catheter malfunction -Currently on Rocephin.   -underwent suprapubic catheter upsizing and exchange by IR on 10/31/2022.  Apparently, he started leaking again on 11/01/2022.  IR was reconsulted: Underwent upsizing exchange again on 11/02/2022 - IR recommended outpatient follow-up with urology.   -Urine cultures grew Enterobacter aerogenes and E. coli.  Currently hemodynamically stable for discharge.  Will discharge to SNF today on oral ciprofloxacin for 2 more days.   Physical  deconditioning -PT recommending SNF placement.    Relapsing-remitting multiple sclerosis with bladder dysfunction and right-sided weakness -Continue baclofen and Neurontin.  Outpatient follow-up with neurology   Bipolar 1 disorder -Continue Seroquel, duloxetine   Normocytic anemia -Questionable cause.  Hemoglobin stable.  Monitor intermittently.   Discharge Instructions  Discharge Instructions     Diet - low sodium heart healthy   Complete by: As directed    Diet general   Complete by: As directed    Increase activity slowly   Complete by: As directed    Increase activity slowly   Complete by: As directed       Allergies as of 11/04/2022       Reactions   Lithium Rash        Medication List     TAKE these medications    acetaminophen 500 MG tablet Commonly known as: TYLENOL Take 500 mg by mouth every 8 (eight) hours as needed for moderate pain.   baclofen 20 MG tablet Commonly known as: LIORESAL Take 1 tablet (20 mg total) by mouth 3 (three) times daily.   ciprofloxacin 500 MG tablet Commonly known as: Cipro Take 1 tablet (500 mg total) by mouth 2 (two) times daily for 2 days.   DULoxetine 30 MG capsule Commonly known as: CYMBALTA Take 1 capsule (30 mg total) by mouth daily.   feeding supplement Liqd Take 237 mLs by mouth 3 (three) times daily between meals.   gabapentin 400 MG capsule Commonly known as: NEURONTIN Take 1 capsule (400 mg total) by mouth 3 (three) times daily.   lidocaine 4 % Commonly known as: HM Lidocaine Patch Place 1 patch onto the skin daily.   natalizumab 300 MG/15ML injection Commonly known as: TYSABRI Inject 15 mLs (300 mg total) into the vein every 28 (  twenty-eight) days.   oxybutynin 10 MG 24 hr tablet Commonly known as: DITROPAN-XL Take 10 mg by mouth daily.   phenazopyridine 200 MG tablet Commonly known as: PYRIDIUM Take 1 tablet (200 mg total) by mouth 3 (three) times daily.   QUEtiapine 200 MG tablet Commonly  known as: SEROQUEL Take 1 tablet (200 mg total) by mouth at bedtime. Take a half tablet at night for 4 days then increase to a whole tablet each night.   Vitamin D (Ergocalciferol) 1.25 MG (50000 UNIT) Caps capsule Commonly known as: DRISDOL Take 1 capsule (50,000 Units total) by mouth every 7 (seven) days.        Follow-up Information     [facility]. Schedule an appointment as soon as possible for a visit in 1 week(s).   Contact information: [address] [place] [address] [place] [address] [phone]               Allergies  Allergen Reactions   Lithium Rash    Consultations: IR   Procedures/Studies: IR Catheter Tube Change  Result Date: 11/03/2022 INDICATION: Pericatheter leakage. EXAM: SUPRAPUBIC CATHETER EXCHANGE COMPARISON:  IR fluoroscopy, 10/31/2022.  CT AP, 09/14/2022. MEDICATIONS: 10 mL lidocaine 1% ANESTHESIA/SEDATION: Local anesthetic and single agent sedation was employed during this procedure. A total of fentanyl 100 mcg was administered intravenously. The patient's level of consciousness and vital signs were monitored continuously by radiology nursing throughout the procedure under my direct supervision. CONTRAST:  15 mL Omnipaque 300 FLUOROSCOPY TIME:  Fluoroscopic dose; 1 mGy COMPLICATIONS: None immediate. PROCEDURE: The procedure, risks, benefits, and alternatives were explained to the patient. Questions regarding the procedure were encouraged and answered. The patient understands and consents to the procedure. A timeout was performed prior to the initiation of the procedure. The external portion of the existing suprapubic catheter as well as the surrounding skin were prepped and draped in usual sterile fashion A preprocedural spot fluoroscopic image was obtained of the lower pelvis and existing 16 Fr Council tip suprapubic catheter with end locked overlying the expected location of the urinary bladder. Contrast injection confirmed  appropriate positioning and functionality existing suprapubic catheter. The external portion of the existing suprapubic catheter was cut and cannulated with a short Amplatz wire which was coiled within the urinary bladder. Under intermittent fluoroscopic guidance, the existing drainage catheter was exchanged for a new 20 Fr Council tip suprapubic catheter Appropriate positioning was confirmed with the injection of a small amount of contrast as well as the efflux of urine. The catheter was connected to a gravity bag. A dressing was placed. The patient tolerated the procedure well without immediate postprocedural complication. FINDINGS: *Preprocedural imaging demonstrates unchanged positioning of the pre-existing drainage catheter with end locked over the expected location of the urinary bladder. *Contrast injection confirms appropriate positioning and functionality of the existing percutaneous drainage catheter. *After fluoroscopic guided exchange, a new 20 Fr Council tip Foley catheter is appropriately positioned within the urinary bladder. IMPRESSION: Successful exchange and upsize to a 20 Fr Council tip suprapubic cystostomy tube. RECOMMENDATIONS: Patient to follow-up with urology for routine SP catheter exchanges. Roanna Banning, MD [facility] Electronically Signed   By: Roanna Banning M.D.   On: 11/03/2022 09:25   IR Catheter Tube Change  Result Date: 10/31/2022 INDICATION: Catheter malfunction, leaking. EXAM: SUPRAPUBIC CATHETER EXCHANGE and UPSIZE COMPARISON:  CT AP, 09/14/2022. MEDICATIONS: None ANESTHESIA/SEDATION: Moderate (conscious) sedation was employed during this procedure. A total of Versed 2 mg  and Fentanyl 75 mcg was administered intravenously. Moderate Sedation Time: 12 minutes. The patient's level of consciousness and vital signs were monitored continuously by radiology nursing throughout the procedure under my direct supervision.  CONTRAST:  15 mL Omnipaque 300 FLUOROSCOPY TIME:  Fluoroscopic dose; 2 mGy COMPLICATIONS: None immediate. PROCEDURE: The procedure, risks, benefits, and alternatives were explained to the patient and/or patient's representative . Questions regarding the procedure were encouraged and answered. The patient understands and consents to the procedure. A timeout was performed prior to the initiation of the procedure. The external portion of the existing suprapubic catheter as well as the surrounding skin were prepped and draped in usual sterile fashion A preprocedural spot fluoroscopic image was obtained of the lower pelvis and existing 12 Fr drainage catheter with end coiled and locked overlying the expected location of the urinary bladder. Contrast injection confirmed occlusion of the pre-existing suprapubic catheter. The external portion of the existing suprapubic catheter was cut and attempted cannulation with a short Amplatz wire. Secondary to occlusion of the catheter was removed and using a 5 Fr Kumpe catheter access into the bladder was performed, then a short 035 inch Amplatz wire was coiled within the urinary bladder. Under intermittent fluoroscopic guidance, the tract was dilated and a new 16 Fr Council tip Foley catheter was placed. Appropriate positioning was confirmed with the injection of a small amount of contrast as well as the efflux of urine. The catheter was connected to a gravity bag. A dressing was placed. The patient tolerated the procedure well without immediate postprocedural complication. FINDINGS: Preprocedural imaging demonstrates unchanged positioning but occluded pre-existing 14 Fr pigtail drainage catheter with end coiled and locked over the expected location of the urinary bladder. After fluoroscopic guided exchange, a new 16 Fr Council tip Foley catheter is appropriately positioned within the urinary bladder. IMPRESSION: 1. Occluded pre-existing pigtail suprapubic drainage catheter. 2.  Successful replacement and upsize to a 16 Fr Council tip suprapubic cystostomy tube. RECOMMENDATIONS: Pt to follow up with Urology for routine SP catheter exchanges. Roanna Banning, MD [facility] Radiology Electronically Signed   By: Roanna Banning M.D.   On: 10/31/2022 10:17   CT Head Wo Contrast  Result Date: 10/10/2022 CLINICAL DATA:  Status post fall. EXAM: CT HEAD WITHOUT CONTRAST TECHNIQUE: Contiguous axial images were obtained from the base of the skull through the vertex without intravenous contrast. RADIATION DOSE REDUCTION: This exam was performed according to the departmental dose-optimization program which includes automated exposure control, adjustment of the mA and/or kV according to patient size and/or use of iterative reconstruction technique. COMPARISON:  May 08, 2010 FINDINGS: Brain: There is mild cerebral atrophy with widening of the extra-axial spaces and ventricular dilatation. There are areas of decreased attenuation within the white matter tracts of the supratentorial brain, consistent with microvascular disease changes. Vascular: No hyperdense vessel or unexpected calcification. Skull: Normal. Negative for fracture or focal lesion. Sinuses/Orbits: There is marked severity right maxillary sinus and right ethmoid sinus mucosal thickening. Other: None. IMPRESSION: 1. No acute intracranial abnormality. 2. 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 3. Right maxillary sinus and right ethmoid sinus disease. Electronically Signed   By: Aram Candela M.D.   On: 10/10/2022 02:22   DG Ankle Complete Right  Result Date: 10/10/2022 CLINICAL DATA:  Fall EXAM: RIGHT ANKLE - COMPLETE 3+ VIEW COMPARISON:  None Available. FINDINGS: There is no evidence of fracture, dislocation, or joint effusion. There is no evidence of arthropathy or  other focal bone abnormality. Soft tissues are unremarkable. IMPRESSION:  Negative. Electronically Signed   By: Charlett Nose M.D.   On: 10/10/2022 01:49   DG Hip Unilat W or Wo Pelvis 2-3 Views Right  Result Date: 10/10/2022 CLINICAL DATA:  Fall EXAM: DG HIP (WITH OR WITHOUT PELVIS) 2-3V RIGHT COMPARISON:  None Available. FINDINGS: There is no evidence of hip fracture or dislocation. There is no evidence of arthropathy or other focal bone abnormality. Pelvic drainage catheter in place in the midline. IMPRESSION: No acute bony abnormality. Electronically Signed   By: Charlett Nose M.D.   On: 10/10/2022 01:49      Subjective: Patient seen and examined at bedside.  Poor historian.  Slow to respond.  No fever, vomiting, agitation reported.  Discharge Exam: Vitals:   11/04/22 0629 11/04/22 0742  BP: 127/73 132/86  Pulse: 87 73  Resp: 15 18  Temp: 98.3 F (36.8 C) 98.4 F (36.9 C)  SpO2: 93% 94%    General: Pt is alert, awake, not in acute distress.  On room air.  Looks chronically ill and deconditioned.  Slow to respond.  Flat affect. Cardiovascular: rate controlled, S1/S2 + Respiratory: bilateral decreased breath sounds at bases Abdominal: Soft, NT, ND, bowel sounds +.  Suprapubic catheter present Extremities: Trace lower extremity edema present; no cyanosis    The results of significant diagnostics from this hospitalization (including imaging, microbiology, ancillary and laboratory) are listed below for reference.     Microbiology: Recent Results (from the past 240 hour(s))  Urine Culture (for pregnant, neutropenic or urologic patients or patients with an indwelling urinary catheter)     Status: Abnormal   Collection Time: 11/02/22 10:00 AM   Specimen: Urine, Suprapubic  Result Value Ref Range Status   Specimen Description URINE, SUPRAPUBIC  Final   Special Requests   Final    NONE Performed at [facility], [address], [place], [place] [address]    Culture (A)  Final    60,000 COLONIES/mL ENTEROBACTER AEROGENES 20,000 COLONIES/mL  ESCHERICHIA COLI    Report Status 11/04/2022 FINAL  Final   Organism ID, Bacteria ENTEROBACTER AEROGENES (A)  Final   Organism ID, Bacteria ESCHERICHIA COLI (A)  Final      Susceptibility   Enterobacter aerogenes - MIC*    CEFEPIME <=0.12 SENSITIVE Sensitive     CEFTRIAXONE <=0.25 SENSITIVE Sensitive     CIPROFLOXACIN <=0.25 SENSITIVE Sensitive     GENTAMICIN <=1 SENSITIVE Sensitive     IMIPENEM 1 SENSITIVE Sensitive     NITROFURANTOIN 64 INTERMEDIATE Intermediate     TRIMETH/SULFA <=20 SENSITIVE Sensitive     PIP/TAZO <=4 SENSITIVE Sensitive     * 60,000 COLONIES/mL ENTEROBACTER AEROGENES   Escherichia coli - MIC*    AMPICILLIN <=2 SENSITIVE Sensitive     CEFAZOLIN <=4 SENSITIVE Sensitive     CEFEPIME <=0.12 SENSITIVE Sensitive     CEFTRIAXONE <=0.25 SENSITIVE Sensitive     CIPROFLOXACIN <=0.25 SENSITIVE Sensitive     GENTAMICIN <=1 SENSITIVE Sensitive     IMIPENEM <=0.25 SENSITIVE Sensitive     NITROFURANTOIN <=16 SENSITIVE Sensitive     TRIMETH/SULFA <=20 SENSITIVE Sensitive     AMPICILLIN/SULBACTAM <=2 SENSITIVE Sensitive     PIP/TAZO <=4 SENSITIVE Sensitive     * 20,000 COLONIES/mL ESCHERICHIA COLI     Labs: BNP (last 3 results) No results for input(s): "BNP" in the last 8760 hours. Basic Metabolic Panel: Recent Labs  Lab 10/30/22 1645 10/30/22 2012 10/31/22 0320  NA 140  --  140  K 3.9  --  3.5  CL 104  --  105  CO2 28  --  24  GLUCOSE 98  --  114*  BUN 8  --  13  CREATININE 0.81 0.89 0.80  CALCIUM 9.7  --  9.0   Liver Function Tests: No results for input(s): "AST", "ALT", "ALKPHOS", "BILITOT", "PROT", "ALBUMIN" in the last 168 hours. No results for input(s): "LIPASE", "AMYLASE" in the last 168 hours. No results for input(s): "AMMONIA" in the last 168 hours. CBC: Recent Labs  Lab 10/30/22 1645 10/30/22 2012 10/31/22 0320  WBC 8.7 10.0 8.8  NEUTROABS 4.4  --   --   HGB 13.3 12.2* 12.1*  HCT 41.2 38.3* 37.1*  MCV 90.9 90.5 90.0  PLT 371 336 312    Cardiac Enzymes: No results for input(s): "CKTOTAL", "CKMB", "CKMBINDEX", "TROPONINI" in the last 168 hours. BNP: Invalid input(s): "POCBNP" CBG: Recent Labs  Lab 10/30/22 2117  GLUCAP 129*   D-Dimer No results for input(s): "DDIMER" in the last 72 hours. Hgb A1c No results for input(s): "HGBA1C" in the last 72 hours. Lipid Profile No results for input(s): "CHOL", "HDL", "LDLCALC", "TRIG", "CHOLHDL", "LDLDIRECT" in the last 72 hours. Thyroid function studies No results for input(s): "TSH", "T4TOTAL", "T3FREE", "THYROIDAB" in the last 72 hours.  Invalid input(s): "FREET3" Anemia work up No results for input(s): "VITAMINB12", "FOLATE", "FERRITIN", "TIBC", "IRON", "RETICCTPCT" in the last 72 hours. Urinalysis    Component Value Date/Time   COLORURINE AMBER (A) 10/30/2022 1547   APPEARANCEUR TURBID (A) 10/30/2022 1547   LABSPEC 1.010 10/30/2022 1547   PHURINE 8.0 10/30/2022 1547   GLUCOSEU NEGATIVE 10/30/2022 1547   HGBUR MODERATE (A) 10/30/2022 1547   BILIRUBINUR NEGATIVE 10/30/2022 1547   BILIRUBINUR negative 04/13/2021 0938   BILIRUBINUR negative 04/08/2020 1045   KETONESUR NEGATIVE 10/30/2022 1547   PROTEINUR 100 (A) 10/30/2022 1547   UROBILINOGEN 0.2 04/13/2021 0938   UROBILINOGEN 1.0 01/02/2013 1453   NITRITE POSITIVE (A) 10/30/2022 1547   LEUKOCYTESUR LARGE (A) 10/30/2022 1547   Sepsis Labs Recent Labs  Lab 10/30/22 1645 10/30/22 2012 10/31/22 0320  WBC 8.7 10.0 8.8   Microbiology Recent Results (from the past 240 hour(s))  Urine Culture (for pregnant, neutropenic or urologic patients or patients with an indwelling urinary catheter)     Status: Abnormal   Collection Time: 11/02/22 10:00 AM   Specimen: Urine, Suprapubic  Result Value Ref Range Status   Specimen Description URINE, SUPRAPUBIC  Final   Special Requests   Final    NONE Performed at [facility], [address] [place], [place] [address]    Culture (A)  Final    60,000 COLONIES/mL  ENTEROBACTER AEROGENES 20,000 COLONIES/mL ESCHERICHIA COLI    Report Status 11/04/2022 FINAL  Final   Organism ID, Bacteria ENTEROBACTER AEROGENES (A)  Final   Organism ID, Bacteria ESCHERICHIA COLI (A)  Final      Susceptibility   Enterobacter aerogenes - MIC*    CEFEPIME <=0.12 SENSITIVE Sensitive     CEFTRIAXONE <=0.25 SENSITIVE Sensitive     CIPROFLOXACIN <=0.25 SENSITIVE Sensitive     GENTAMICIN <=1 SENSITIVE Sensitive     IMIPENEM 1 SENSITIVE Sensitive     NITROFURANTOIN 64 INTERMEDIATE Intermediate     TRIMETH/SULFA <=20 SENSITIVE Sensitive     PIP/TAZO <=4 SENSITIVE Sensitive     * 60,000 COLONIES/mL ENTEROBACTER AEROGENES   Escherichia coli - MIC*    AMPICILLIN <=2 SENSITIVE Sensitive  CEFAZOLIN <=4 SENSITIVE Sensitive     CEFEPIME <=0.12 SENSITIVE Sensitive     CEFTRIAXONE <=0.25 SENSITIVE Sensitive     CIPROFLOXACIN <=0.25 SENSITIVE Sensitive     GENTAMICIN <=1 SENSITIVE Sensitive     IMIPENEM <=0.25 SENSITIVE Sensitive     NITROFURANTOIN <=16 SENSITIVE Sensitive     TRIMETH/SULFA <=20 SENSITIVE Sensitive     AMPICILLIN/SULBACTAM <=2 SENSITIVE Sensitive     PIP/TAZO <=4 SENSITIVE Sensitive     * 20,000 COLONIES/mL ESCHERICHIA COLI     Time coordinating discharge: 35 minutes  SIGNED:   Glade Lloyd, MD  [facility] 11/04/2022, 9:48 AM

## 2022-11-04 NOTE — TOC Transition Note (Signed)
Transition of Care [facility]) - CM/SW Discharge Note   Patient Details  Name: Bradley Brandt MRN: [id number] Date of Birth: [birth year]  Transition of [facility] CM/SW Contact:  Deatra Robinson, [place] Phone Number: 11/04/2022, 11:19 AM   Clinical Narrative: Pt for dc [facility] today. Spoke to [facility] in admissions who confirmed auth received and they are prepared to admit pt to room 129A. Pt aware of dc and reports agreeable. RN provided with number for report and PTAR arranged for transport. SW signing off at dc.   Dellie Burns, MSW, LCSW [phone] (coverage)      Final next level of care: Skilled Nursing Facility Barriers to Discharge: No Barriers Identified   Patient Goals and CMS Choice CMS Medicare.gov Compare Post Acute Care list provided to:: Patient Choice offered to / list presented to : Patient  Discharge Placement                Patient chooses bed at: Other - please specify in the comment section below: ([facility]) Patient to be transferred to facility by: PTAR Name of family member notified: Pt to update family Patient and family notified of of transfer: 11/04/22  Discharge Plan and Services Additional resources added to the After Visit Summary for   In-house Referral: Clinical Social Work                                   Social Determinants of Health (SDOH) Interventions SDOH Screenings   Food Insecurity: Patient Declined (10/30/2022)  Housing: Low Risk  (10/30/2022)  Transportation Needs: No Transportation Needs (10/30/2022)  Utilities: Not At Risk (10/30/2022)  Depression (PHQ2-9): Low Risk  (10/12/2021)  Recent Concern: Depression (PHQ2-9) - Medium Risk (08/04/2021)  Tobacco Use: High Risk (11/03/2022)     Readmission Risk Interventions     No data to display

## 2022-11-04 NOTE — Progress Notes (Signed)
DISCHARGE NOTE HOME Bradley Brandt to be discharged Skilled nursing facility [facility]) per MD order. Discussed prescriptions and follow up appointments with the patient and Darl Householder, LPN at [facility].  medication list explained in detail. Patient verbalized understanding.  Skin clean, dry and intact without evidence of skin break down, no evidence of skin tears noted. IV catheter discontinued intact. Site without signs and symptoms of complications. Dressing and pressure applied. Pt denies pain at the site currently. No complaints noted.  Patient free of lines, drains, and wounds.   An After Visit Summary (AVS) was printed and given to the patient. Patient escorted via wheelchair, and discharged via PT[facility] to [facility], [place]

## 2022-11-04 NOTE — Plan of Care (Signed)

## 2022-11-08 ENCOUNTER — Telehealth: Payer: Self-pay | Admitting: Internal Medicine

## 2022-11-08 ENCOUNTER — Other Ambulatory Visit: Payer: Self-pay

## 2022-11-08 DIAGNOSIS — R261 Paralytic gait: Secondary | ICD-10-CM

## 2022-11-08 DIAGNOSIS — Z7409 Other reduced mobility: Secondary | ICD-10-CM

## 2022-11-08 DIAGNOSIS — G35 Multiple sclerosis: Secondary | ICD-10-CM

## 2022-11-08 DIAGNOSIS — G8114 Spastic hemiplegia affecting left nondominant side: Secondary | ICD-10-CM

## 2022-11-08 NOTE — Telephone Encounter (Signed)
Error

## 2022-11-08 NOTE — Telephone Encounter (Signed)
Patients can't receive home health while in a facility but if he is discharged home we can set him up with [facility].
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
## 2022-11-09 NOTE — Telephone Encounter (Signed)
[employer] called yesterday and advised Dr. Epimenio Foot and infusion site in network despite letter we received. They could not locate case number from denial letter we received.   I submitted a new PA request under medical benefit on covermymeds. Key: [id number]. Waiting on determination from [place].   Called [employer] at [phone]. Spoke w/ Nessa. Confirmed Despina Arias, MD in network. Pt ID: [id number] Infusion site NPI: [id number]. Confirmed they are also network. Call ref# [id number].  She transferred me to [facility] department. Spoke w/ Trinna Post (pre-cert department). He checked Jcode: 2323. States this is handled by specialty pharmacy department and I need to call phone# [phone]. I called this phone # Administrator pre=cert). Spoke w/ Lenice Pressman. He has Management consultant. She tried transferring me but no one was on other line.   I ended call and called back. Spoke w/ Missy. States no NPI/Tax ID listed on PA faxed in on 09/28/22. Denied because they could not locate [facility]. I verbally provided NPI: [id number] and Tax ID: [id number]. States [facility] listed as an office setting and not as as outpatient infusion center. Multi provider group (office setting).   I messaged infusion site asking if they know who can help get this updated on their end so we can complete auth. Waiting on response.

## 2022-11-14 ENCOUNTER — Telehealth: Payer: Self-pay | Admitting: Neurology

## 2022-11-14 ENCOUNTER — Encounter (HOSPITAL_COMMUNITY): Payer: 59

## 2022-11-14 NOTE — Telephone Encounter (Signed)
We are still waiting on message back from [facility] about getting them to update how his insurance has the infusion suite listed in order to complete the prior auth. I sent email today asking for update, waiting on response from [facility].

## 2022-11-14 NOTE — Telephone Encounter (Signed)
Vee called from [facility]. Stated she needs to talk to nurse about pt infusion

## 2022-11-14 NOTE — Telephone Encounter (Signed)
Received the following response back from [facility]:  "Good afternoon, I have not heard anything back yet, but there may be some delays due to the holiday weekend. I will send an update as soon as I hear something back.  Best, Randon Goldsmith [place]  [facility] Sr Payor Enrollment Specialist Direct Dial: [phone]  Fax: [phone]"

## 2022-11-15 ENCOUNTER — Emergency Department (HOSPITAL_COMMUNITY)
Admission: EM | Admit: 2022-11-15 | Discharge: 2022-11-15 | Disposition: A | Discharge status: Home / Self Care | Payer: 59 | Attending: Emergency Medicine | Admitting: Emergency Medicine

## 2022-11-15 ENCOUNTER — Encounter (HOSPITAL_COMMUNITY): Payer: Self-pay

## 2022-11-15 DIAGNOSIS — T83098A Other mechanical complication of other indwelling urethral catheter, initial encounter: Secondary | ICD-10-CM | POA: Diagnosis present

## 2022-11-15 DIAGNOSIS — Y732 Prosthetic and other implants, materials and accessory gastroenterology and urology devices associated with adverse incidents: Secondary | ICD-10-CM | POA: Diagnosis not present

## 2022-11-15 DIAGNOSIS — T83010A Breakdown (mechanical) of cystostomy catheter, initial encounter: Secondary | ICD-10-CM

## 2022-11-15 LAB — COMPREHENSIVE METABOLIC PANEL
ALT: 19 U/L (ref 0–44)
AST: 14 U/L — ABNORMAL LOW (ref 15–41)
Albumin: 3 g/dL — ABNORMAL LOW (ref 3.5–5.0)
Alkaline Phosphatase: 63 U/L (ref 38–126)
Anion gap: 14 (ref 5–15)
BUN: 11 mg/dL (ref 6–20)
CO2: 26 mmol/L (ref 22–32)
Calcium: 9.6 mg/dL (ref 8.9–10.3)
Chloride: 99 mmol/L (ref 98–111)
Creatinine, Ser: 0.78 mg/dL (ref 0.61–1.24)
GFR, Estimated: >60 mL/min (ref >60)
Glucose, Bld: 90 mg/dL (ref 70–99)
Potassium: 4.2 mmol/L (ref 3.5–5.1)
Sodium: 139 mmol/L (ref 135–145)
Total Bilirubin: 0.7 mg/dL (ref 0.3–1.2)
Total Protein: 6.6 g/dL (ref 6.5–8.1)

## 2022-11-15 LAB — CBC WITH DIFFERENTIAL/PLATELET
Abs Immature Granulocytes: 0.04 10*3/uL (ref 0.00–0.07)
Basophils Absolute: 0 10*3/uL (ref 0.0–0.1)
Basophils Relative: 1 %
Eosinophils Absolute: 0.3 10*3/uL (ref 0.0–0.5)
Eosinophils Relative: 3 %
HCT: 35.5 % — ABNORMAL LOW (ref 39.0–52.0)
Hemoglobin: 11 g/dL — ABNORMAL LOW (ref 13.0–17.0)
Immature Granulocytes: 1 %
Lymphocytes Relative: 29 %
Lymphs Abs: 2.5 10*3/uL (ref 0.7–4.0)
MCH: 29.2 pg (ref 26.0–34.0)
MCHC: 31 g/dL (ref 30.0–36.0)
MCV: 94.2 fL (ref 80.0–100.0)
Monocytes Absolute: 0.7 10*3/uL (ref 0.1–1.0)
Monocytes Relative: 8 %
Neutro Abs: 5.1 10*3/uL (ref 1.7–7.7)
Neutrophils Relative %: 58 %
Platelets: 244 10*3/uL (ref 150–400)
RBC: 3.77 MIL/uL — ABNORMAL LOW (ref 4.22–5.81)
RDW: 14.9 % (ref 11.5–15.5)
WBC: 8.7 10*3/uL (ref 4.0–10.5)
nRBC: 0 % (ref 0.0–0.2)

## 2022-11-15 LAB — URINALYSIS, ROUTINE W REFLEX MICROSCOPIC
Bilirubin Urine: NEGATIVE
Glucose, UA: NEGATIVE mg/dL
Ketones, ur: NEGATIVE mg/dL
Nitrite: NEGATIVE
Protein, ur: 100 mg/dL — AB
RBC / HPF: >50 RBC/hpf (ref 0–5)
Specific Gravity, Urine: 1.011 (ref 1.005–1.030)
pH: 8 (ref 5.0–8.0)

## 2022-11-15 LAB — LACTIC ACID, PLASMA: Lactic Acid, Venous: 0.7 mmol/L (ref 0.5–1.9)

## 2022-11-15 MED ORDER — SULFAMETHOXAZOLE-TRIMETHOPRIM 800-160 MG PO TABS
1.0000 | ORAL_TABLET | Freq: Once | ORAL | Status: AC
  Administered 2022-11-15: 1 via ORAL
  Filled 2022-11-15: qty 1

## 2022-11-15 MED ORDER — SULFAMETHOXAZOLE-TRIMETHOPRIM 800-160 MG PO TABS: 1.0000 | ORAL_TABLET | Freq: Two times a day (BID) | ORAL | 0 refills | Status: AC

## 2022-11-15 MED ORDER — MORPHINE SULFATE (PF) 4 MG/ML IV SOLN
4.0000 mg | Freq: Once | INTRAVENOUS | Status: AC
  Administered 2022-11-15: 4 mg via INTRAVENOUS
  Filled 2022-11-15: qty 1

## 2022-11-15 NOTE — Telephone Encounter (Signed)
Things are still pending w/ Pt care center/[facility] and getting insurance info updated.   Verified that [facility] [employer] takes pt insurance. [address], [address] [place],  [place]  [address] Phone: [phone]  Fax: [phone]  I called father, Kayshawn Schmuhl at [phone]. They were agreeable to have son transferred to alternate infusion site. Aware we will fax urgent orders tomorrow once Dr. Epimenio Foot back in to sign tomorrow (out today).   He asked that [facility] home/rehab be contacted to schedule pt for infusion once approved. Phone: [phone]. Address: [address]  [place], [place] [address].   Aware we will update him once we know more.

## 2022-11-15 NOTE — ED Triage Notes (Signed)
Pt bib ems from [facility]; reports suprapubic catheter has been leaking urine with redness, swelling, and purulent drainage at site x 2 days; denies fevers, denies N/V;  pt denies pain; 122/68, P 80, 94% RA, RR 18; hx MS

## 2022-11-15 NOTE — ED Notes (Signed)
PTAR here to pick up patient.  Family had questions about why urine is still coming through penis.  Explained to patient reason and being treated with ABX to help.  Patient and family feel comfortable at this time.

## 2022-11-15 NOTE — Consult Note (Signed)
Urology Consult  Referring physician: Claudette Stapler, PA-C Reason for referral: drainage reported around SPT  Chief Complaint: as above  History of Present Illness:  Bradley Brandt. Oo is a 42 year old male known to our practice with PMH significant for type I bipolar disorder, relapsing-remitting MS with associated right-sided weakness, and neurogenic bladder also secondary to MS requiring SPT.  He was recently hospitalized and discharged on 5/18 for catheter malfunction ultimately resulting in SPT upsizing and exchange by IR on 5/14 and 5/16 respectively.  Patient reports to the emergency department from his facility today with complaints of pus draining from SPT site.  He is known to our practice and scheduled to follow-up with Dr. Alvester Morin tomorrow.  Past Medical History:  Diagnosis Date   Bipolar 1 disorder (HCC)    Bipolar disorder, unspecified (HCC) 01/03/2013   MS (multiple sclerosis) (HCC) dx 2006   relapsing-remitting right sided weakness   Neuromuscular disorder [facility])    MS   Past Surgical History:  Procedure Laterality Date   IR CATHETER TUBE CHANGE  10/31/2022   IR CATHETER TUBE CHANGE  11/02/2022   IR FLUORO GUIDED NEEDLE PLC ASPIRATION/INJECTION LOC  09/07/2022   TUMOR REMOVAL  1983   abdomen benign    Medications: I have reviewed the patient's current medications. Allergies:  Allergies  Allergen Reactions   Lithium Rash    Family History  Adopted: Yes  Problem Relation Age of Onset   Lupus Mother    Ataxia Neg Hx    Chorea Neg Hx    Dementia Neg Hx    Mental retardation Neg Hx    Migraines Neg Hx    Multiple sclerosis Neg Hx    Neurofibromatosis Neg Hx    Neuropathy Neg Hx    Parkinsonism Neg Hx    Seizures Neg Hx    Stroke Neg Hx    Social History:  reports that he has been smoking cigarettes. He has been smoking an average of 1 pack per day. He has never used smokeless tobacco. He reports that he does not currently use alcohol after a past usage of about  72.0 standard drinks of alcohol per week. He reports that he does not currently use drugs after having used the following drugs: Marijuana. Frequency: 7.00 times per week.  ROS: All systems are reviewed and negative except as noted. Abdominal pain, relieved with balloon adjustment.  Physical Exam:  Vital signs in last 24 hours: Temp:  [98.8 F (37.1 C)-99.1 F (37.3 C)] 98.8 F (37.1 C) (05/29 1330) Pulse Rate:  [66] 66 (05/29 1314) Resp:  [18] 18 (05/29 1314) BP: (119)/(71) 119/71 (05/29 1314) SpO2:  [98 %] 98 % (05/29 1314) Weight:  [84 kg] 84 kg (05/29 1308)  Cardiovascular: Skin warm; not flushed Respiratory: Breaths quiet; no shortness of breath Abdomen: No masses, soft, no guarding or rebound Neurological: Normal sensation to touch Musculoskeletal: Normal motor function arms and legs Skin: No rashes Genitourinary: SPT in place draining clear yellow urine.  Peripheral induration with some fibrinous exudate   Laboratory Data:  Results for orders placed or performed during the hospital encounter of 11/15/22 (from the past 72 hour(s))  CBC with Differential     Status: Abnormal   Collection Time: 11/15/22  1:19 PM  Result Value Ref Range   WBC 8.7 4.0 - 10.5 K/uL   RBC 3.77 (L) 4.22 - 5.81 MIL/uL   Hemoglobin 11.0 (L) 13.0 - 17.0 g/dL   HCT 16.1 (L) 09.6 - 04.5 %  MCV 94.2 80.0 - 100.0 fL   MCH 29.2 26.0 - 34.0 pg   MCHC 31.0 30.0 - 36.0 g/dL   RDW 16.1 09.6 - 04.5 %   Platelets 244 150 - 400 K/uL   nRBC 0.0 0.0 - 0.2 %   Neutrophils Relative % 58 %   Neutro Abs 5.1 1.7 - 7.7 K/uL   Lymphocytes Relative 29 %   Lymphs Abs 2.5 0.7 - 4.0 K/uL   Monocytes Relative 8 %   Monocytes Absolute 0.7 0.1 - 1.0 K/uL   Eosinophils Relative 3 %   Eosinophils Absolute 0.3 0.0 - 0.5 K/uL   Basophils Relative 1 %   Basophils Absolute 0.0 0.0 - 0.1 K/uL   Immature Granulocytes 1 %   Abs Immature Granulocytes 0.04 0.00 - 0.07 K/uL    Comment: Performed at [facility],  [address] [place], [place] [address]  Comprehensive metabolic panel     Status: Abnormal   Collection Time: 11/15/22  1:19 PM  Result Value Ref Range   Sodium 139 135 - 145 mmol/L   Potassium 4.2 3.5 - 5.1 mmol/L   Chloride 99 98 - 111 mmol/L   CO2 26 22 - 32 mmol/L   Glucose, Bld 90 70 - 99 mg/dL    Comment: Glucose reference range applies only to samples taken after fasting for at least 8 hours.   BUN 11 6 - 20 mg/dL   Creatinine, Ser 1.91 0.61 - 1.24 mg/dL   Calcium 9.6 8.9 - 47.8 mg/dL   Total Protein 6.6 6.5 - 8.1 g/dL   Albumin 3.0 (L) 3.5 - 5.0 g/dL   AST 14 (L) 15 - 41 U/L   ALT 19 0 - 44 U/L   Alkaline Phosphatase 63 38 - 126 U/L   Total Bilirubin 0.7 0.3 - 1.2 mg/dL   GFR, Estimated >29 >56 mL/min    Comment: (NOTE) Calculated using the CKD-EPI Creatinine Equation (2021)    Anion gap 14 5 - 15    Comment: Performed at [facility], [address] [place], [place] [address]   No results found for this or any previous visit (from the past 240 hour(s)). Creatinine: Recent Labs    11/15/22 1319  CREATININE 0.78    Imaging: No imaging collected  Assessment/Plan:  Small peripheral area of induration with some fibrinous exudate.  SPT tract may have some infection versus inflammation from recent dilation.  Would provide one week prophylactic course of antibiotics, such as Keflex.  Balloon was found to be overinflated to 14cc.  Patients with chronic catheters frequently have atrophied bladders with extremely small capacity.  The difference between leaking or pain may be a matter of a few cc's.  I took the balloon down, confirmed placement and reinflated with half of that volume, 7cc.  Patient reported that pain had resolved.   Patient has appointment scheduled for the clinic tomorrow.  Unless there is a reason medicine wants to keep him, I would recommend him.   Scherrie Bateman Lailana Shira 11/15/2022, 2:38 PM  Pager: [phone]

## 2022-11-15 NOTE — ED Notes (Signed)
RN notified EDP for pain medication

## 2022-11-15 NOTE — ED Provider Notes (Signed)
[place] EMERGENCY DEPARTMENT AT [facility] Provider Note   CSN: [id number] Arrival date & time: 11/15/22  1305     History  No chief complaint on file.   Bradley Brandt is a 42 y.o. male with a past medical history significant for bipolar 1 disorder, relapsing-remitting multiple sclerosis causing right-sided weakness as well as requiring suprapubic catheter who presents to the ED due to suprapubic catheter malfunction.  Patient states he is leaking urine around the suprapubic catheter and also passing urine from his penis.  Patient recently admitted on 5/13 to 5/18 for the same complaint and had his suprapubic catheter exchanged by IR on 5/14 and again on 5/16.  Patient states purulent drainage has been present for the past 2 days.  No fever or chills.  He notes urine has been coming from his penis since earlier this morning. Treated for UTI during his admission and finished po ciprofloxacin. Currently residing at [facility] nursing facility.  Patient is wheelchair-bound secondary to MS.  History obtained from patient and past medical records. No interpreter used during encounter.       Home Medications Prior to Admission medications   Medication Sig Start Date End Date Taking? Authorizing Provider  acetaminophen (TYLENOL) 500 MG tablet Take 500 mg by mouth every 8 (eight) hours as needed for moderate pain.   Yes [provider]  baclofen (LIORESAL) 20 MG tablet Take 1 tablet (20 mg total) by mouth 3 (three) times daily. 08/18/22  Yes Paseda, Marseille Kay, FNP  DULoxetine (CYMBALTA) 30 MG capsule Take 1 capsule (30 mg total) by mouth daily. 07/22/22  Yes Paseda, Marts Kay, FNP  feeding supplement (ENSURE ENLIVE / ENSURE PLUS) LIQD Take 237 mLs by mouth 3 (three) times daily between meals. 08/10/22  Yes Mapp, Tavien, MD  gabapentin (NEURONTIN) 400 MG capsule Take 1 capsule (400 mg total) by mouth 3 (three) times daily. 08/10/22  Yes Mapp, Tavien, MD  lidocaine (HM  LIDOCAINE PATCH) 4 % Place 1 patch onto the skin daily. 10/12/21  Yes Passmore, Tewana I, NP  melatonin 3 MG TABS tablet Take 3 mg by mouth at bedtime.   Yes [provider]  metroNIDAZOLE (METROCREAM) 0.75 % cream Apply 1 Application topically 2 (two) times daily.   Yes [provider]  natalizumab (TYSABRI) 300 MG/15ML injection Inject 15 mLs (300 mg total) into the vein every 28 (twenty-eight) days. 09/05/16  Yes Sater, Pearletha Furl, MD  oxybutynin (DITROPAN-XL) 10 MG 24 hr tablet Take 10 mg by mouth daily. 10/09/22  Yes [provider]  phenazopyridine (PYRIDIUM) 200 MG tablet Take 1 tablet (200 mg total) by mouth 3 (three) times daily. 09/14/22  Yes Mardene Sayer, MD  QUEtiapine (SEROQUEL) 200 MG tablet Take 1 tablet (200 mg total) by mouth at bedtime. Take a half tablet at night for 4 days then increase to a whole tablet each night. Patient taking differently: Take 200 mg by mouth at bedtime. 07/22/22  Yes Paseda, Fountaine Kay, FNP  sulfamethoxazole-trimethoprim (BACTRIM DS) 800-160 MG tablet Take 1 tablet by mouth 2 (two) times daily for 7 days. 11/15/22 11/22/22 Yes Lyndell Gillyard, Merla Riches, PA-C  Vitamin D, Ergocalciferol, (DRISDOL) 1.25 MG (50000 UNIT) CAPS capsule Take 1 capsule (50,000 Units total) by mouth every 7 (seven) days. 06/29/22  Yes Sater, Pearletha Furl, MD      Allergies    Lithium    Review of Systems   Review of Systems  Constitutional:  Negative for chills and  fever.  Skin:  Positive for wound.    Physical Exam Updated Vital Signs BP 110/61   Pulse 80   Temp 98.8 F (37.1 C) (Rectal)   Resp 12   Ht 5\' 9"  (1.753 m)   Wt 84 kg   SpO2 100%   BMI 27.35 kg/m  Physical Exam Vitals and nursing note reviewed.  Constitutional:      General: He is not in acute distress.    Appearance: He is not ill-appearing.  HENT:     Head: Normocephalic.  Eyes:     Pupils: Pupils are equal, round, and reactive to light.  Cardiovascular:     Rate and Rhythm:  Normal rate and regular rhythm.     Pulses: Normal pulses.     Heart sounds: Normal heart sounds. No murmur heard.    No friction rub. No gallop.  Pulmonary:     Effort: Pulmonary effort is normal.     Breath sounds: Normal breath sounds.  Abdominal:     General: Abdomen is flat. There is no distension.     Palpations: Abdomen is soft.     Tenderness: There is no abdominal tenderness. There is no guarding or rebound.     Comments: Suprapubic catheter in place with very mild erythema. Slight purulent drainage from catheter site.   Genitourinary:    Comments: GU exam performed with chaperone in room.  Urine in depends.  Urine coming from penis. Musculoskeletal:        General: Normal range of motion.     Cervical back: Neck supple.  Skin:    General: Skin is warm and dry.  Neurological:     General: No focal deficit present.     Mental Status: He is alert.  Psychiatric:        Mood and Affect: Mood normal.        Behavior: Behavior normal.     ED Results / Procedures / Treatments   Labs (all labs ordered are listed, but only abnormal results are displayed) Labs Reviewed  CBC WITH DIFFERENTIAL/PLATELET - Abnormal; Notable for the following components:      Result Value   RBC 3.77 (*)    Hemoglobin 11.0 (*)    HCT 35.5 (*)    All other components within normal limits  COMPREHENSIVE METABOLIC PANEL - Abnormal; Notable for the following components:   Albumin 3.0 (*)    AST 14 (*)    All other components within normal limits  CULTURE, BLOOD (ROUTINE X 2)  CULTURE, BLOOD (ROUTINE X 2)  LACTIC ACID, PLASMA  URINALYSIS, ROUTINE W REFLEX MICROSCOPIC  LACTIC ACID, PLASMA    EKG None  Radiology No results found.  Procedures Procedures    Medications Ordered in ED Medications  sulfamethoxazole-trimethoprim (BACTRIM DS) 800-160 MG per tablet 1 tablet (has no administration in time range)  morphine (PF) 4 MG/ML injection 4 mg (4 mg Intravenous Given 11/15/22 1401)     ED Course/ Medical Decision Making/ A&P                             Medical Decision Making Amount and/or Complexity of Data Reviewed Independent Historian: EMS External Data Reviewed: notes.    Details: Previous admission notes Labs: ordered. Decision-making details documented in ED Course.  Risk Prescription drug management.   This patient presents to the ED for concern of suprapubic dysfunction, this involves an extensive number of treatment options, and is  a complaint that carries with it a high risk of complications and morbidity.  The differential diagnosis includes infection, blockage, displacement, etc  42 year old male with history of MS presents to the ED due to concerns about a suprapubic catheter malfunction.  Patient has been leaking and has some purulent drainage from site.  Recently replaced by IR during his last admission a week and a half ago.  No fever or chills.  Upon arrival patient afebrile, not tachycardic or hypoxic.  Patient in no acute distress.  Mild erythema surrounding suprapubic catheter with minimal purulent drainage.  Routine labs ordered.  Lactic acid and blood cultures.  Rectal temperature checked which was normal.  CBC with no leukocytosis.  Anemia with hemoglobin 11.  CMP reassuring.  Normal renal function.  No major electrolyte derangements.  Lactic acid normal.  Blood cultures pending.  Low suspicion for sepsis.  1:41 PM Discussed with Dr. Grace Isaac with IR who notes suprapubic catheter is now managed by urology.  He notes it is not unexpected to have some urine passing from the penis.  1:57 PM Discussed with urology who will come see patient.   3:05 PM Elmon Kirschner, NP with urology evaluated patient at bedside. He recommends placing patient on antibiotics. No need to replace suprapubic catheter. No need for imaging after discussion with Sheria Lang and Dr. Rosalia Hammers. Patient given first dose of Bactrim in the ED and discharged with same.  UA pending at  discharge however, Bactrim will cover possible UTI.  Patient has appointment with urology tomorrow morning. Advised patient to report to schedule appointment. Strict ED precautions discussed with patient. Patient states understanding and agrees to plan. Patient discharged home in no acute distress and stable vitals  Has PCP       Final Clinical Impression(s) / ED Diagnoses Final diagnoses:  Suprapubic catheter dysfunction, initial encounter [facility])    Rx / DC Orders ED Discharge Orders          Ordered    sulfamethoxazole-trimethoprim (BACTRIM DS) 800-160 MG tablet  2 times daily        11/15/22 1519              Mannie Stabile, PA-C 11/15/22 1525    Margarita Grizzle, MD 11/16/22 [phone]

## 2022-11-15 NOTE — Discharge Instructions (Signed)
It was a pleasure taking care of you today.  As discussed, I am sending you home with an antibiotic.  Please report to your urology appointment tomorrow morning.  Your labs are reassuring.  Return to the ER for any worsening symptoms.

## 2022-11-16 ENCOUNTER — Other Ambulatory Visit (HOSPITAL_COMMUNITY): Payer: Self-pay

## 2022-11-16 ENCOUNTER — Other Ambulatory Visit: Payer: Self-pay

## 2022-11-16 ENCOUNTER — Telehealth: Payer: Self-pay

## 2022-11-16 LAB — CULTURE, BLOOD (ROUTINE X 2)

## 2022-11-16 NOTE — Telephone Encounter (Signed)
Faxed urgent Tysabri order to [facility] infusion[facility] at [phone]. Received fax confirmation.

## 2022-11-16 NOTE — Transitions of Care (Post Inpatient/ED Visit) (Signed)
   11/16/2022  Name: Bradley Brandt MRN: [id number] DOB: 1981/04/13  Today's TOC FU Call Status: Today's TOC FU Call Status:: Unsuccessul Call (1st Attempt) Unsuccessful Call (1st Attempt) Date: 11/16/22  Attempted to reach the patient regarding the most recent Inpatient/ED visit.  Follow Up Plan: Additional outreach attempts will be made to reach the patient to complete the Transitions of Care (Post Inpatient/ED visit) call.   Abelino Derrick, MHA [facility]  Managed [facility] Social Worker [phone]

## 2022-11-18 DIAGNOSIS — G35 Multiple sclerosis: Secondary | ICD-10-CM | POA: Diagnosis not present

## 2022-11-19 LAB — CULTURE, BLOOD (ROUTINE X 2)

## 2022-11-20 ENCOUNTER — Telehealth: Payer: Self-pay | Admitting: Neurology

## 2022-11-20 ENCOUNTER — Other Ambulatory Visit (HOSPITAL_COMMUNITY): Payer: Self-pay

## 2022-11-20 ENCOUNTER — Telehealth: Payer: Self-pay | Admitting: Pharmacy Technician

## 2022-11-20 LAB — CULTURE, BLOOD (ROUTINE X 2): Culture: NO GROWTH

## 2022-11-20 NOTE — Telephone Encounter (Signed)
Kim called from [facility]. Stated she needs to speak with  nurse about getting a prescription sent over.

## 2022-11-20 NOTE — Telephone Encounter (Signed)
Returned call to kim at the [facility] health infusion center [phone] and they needed tysabri they stated that they now have it under control and will call back should they have issues in the future.

## 2022-11-20 NOTE — Telephone Encounter (Addendum)
Auth Submission: APPROVED Site of care: [facility] Payer: [employer] Medication & CPT/J Code(s) submitted: Tysabri (Natalizumab) [id number] Route of submission (phone, fax, portal):  Phone # [facility] Fax # Auth type: PHARMACY  Units/visits requested: 300MG  Q28D Reference number: [id number] KEY: [id number] Approval from: 11/20/22 to 11/20/23   Touch on-line: [phone] Pt id: [id number] Change in site: [facility] (updated/approved) Patient [facility]1 dose [facility] (complimentary dose)  Tysabri Touch has made attempts to reach patient for screening. Awaiting Touch Program to complete BIV and screening with patient .Last attempt 10/17/22. Awaiting f/u from patient.  Rep: Crystal-P (case manager) Phone: [phone]  Co-pay card: Pending Patient has met OOP and Deductible.  @ yatin: Fyi flag and excell sheet has been updated.  Patient does not have cell phone. Please contact Exton Suhre (father) Phone: [phone]  Patient is currently @ [facility], please call to schedule treatment date Phone: [phone]

## 2022-11-21 NOTE — Telephone Encounter (Signed)
Called and LVM for [facility] to see if pt was able to get scheduled. Asked they call office back at [phone].

## 2022-11-21 NOTE — Telephone Encounter (Signed)
Lynden Ang called back and spoke w/ Lisa/phone room. Relayed that they are waiting for medication to ship and then they will call to schedule pt once they receive.

## 2022-11-22 NOTE — Telephone Encounter (Signed)
Took call from phone staff and spoke w/ Bradley Brandt (facility where pt resides currently). Made her aware of update that pt transferred to [facility]. As of yesterday, they were waiting on medication prior to scheduling pt. She will give them a call to get update. I provided her their phone# and address: [address], [address] [place],  [place]  [address] Phone: [phone]

## 2022-11-23 ENCOUNTER — Encounter: Payer: Self-pay | Admitting: Neurology

## 2022-11-23 ENCOUNTER — Emergency Department (HOSPITAL_COMMUNITY)
Admission: EM | Admit: 2022-11-23 | Discharge: 2022-11-23 | Disposition: A | Discharge status: Home / Self Care | Payer: 59 | Attending: Emergency Medicine | Admitting: Emergency Medicine

## 2022-11-23 DIAGNOSIS — T83010A Breakdown (mechanical) of cystostomy catheter, initial encounter: Secondary | ICD-10-CM

## 2022-11-23 DIAGNOSIS — R339 Retention of urine, unspecified: Secondary | ICD-10-CM | POA: Diagnosis not present

## 2022-11-23 DIAGNOSIS — T83098A Other mechanical complication of other indwelling urethral catheter, initial encounter: Secondary | ICD-10-CM | POA: Insufficient documentation

## 2022-11-23 MED ORDER — LIDOCAINE HCL URETHRAL/MUCOSAL 2 % EX GEL
1.0000 | Freq: Once | CUTANEOUS | Status: AC
  Administered 2022-11-23: 1 via TOPICAL
  Filled 2022-11-23: qty 11

## 2022-11-23 MED ORDER — MORPHINE SULFATE (PF) 4 MG/ML IV SOLN
4.0000 mg | Freq: Once | INTRAVENOUS | Status: AC
  Administered 2022-11-23: 4 mg via INTRAVENOUS
  Filled 2022-11-23: qty 1

## 2022-11-23 NOTE — Consult Note (Signed)
Urology Consult   Physician requesting consult: Linwood Dibbles, MD  Reason for consult: SPT malfunction  History of Present Illness: Bradley Brandt is a 43 y.o. male with PMH of relapsing-remitting MS c/b NGB managed with SPT who presented to [facility] ED with complaints of SPT malfunction. He reports his SP tube stopped draining this morning. Prior to that, he denies seeing significant sediment, hematuria/clot. He denies traumatically dislodging the SPT.  He has been voiding small amounts per urethra. Bladder scan in ED performed by Urology 60-90cc.   SPT last exchanged 11/02/22 by IR to a 20Fr counciltip. He was seen in ED on 11/15/22 for purulent drainage around SPT site and was started on abx.  Prior to evaluation, attempts by ED to remove SPT met with significant resistance. The balloon was completely deflated and the catheter was unable to be removed. Balloon was then reportedly re-inflated. On my evaluation, SPT was unable to be manually irrigated due to significant resistance. There was similar resistance met on attempted removal after confirming balloon was emptied. The catheter was cut and I was going to attempt probing the balloon port with a sensorwire when the catheter was then dislodged. The tract was patent and there was mild bleeding at the site. The tip of the catheter was notable for significant calcifications. A 20Fr 2-way was then replaced with ease and flushed with 50cc sterile saline to confirm proper placement. 8cc sterile water were used to inflate the balloon.  Past Medical History:  Diagnosis Date   Bipolar 1 disorder (HCC)    Bipolar disorder, unspecified (HCC) 01/03/2013   MS (multiple sclerosis) (HCC) dx 2006   relapsing-remitting right sided weakness   Neuromuscular disorder [facility])    MS    Past Surgical History:  Procedure Laterality Date   IR CATHETER TUBE CHANGE  10/31/2022   IR CATHETER TUBE CHANGE  11/02/2022   IR FLUORO GUIDED NEEDLE PLC ASPIRATION/INJECTION LOC   09/07/2022   TUMOR REMOVAL  1983   abdomen benign    Current Hospital Medications:  Home Meds:  No current facility-administered medications on file prior to encounter.   Current Outpatient Medications on File Prior to Encounter  Medication Sig Dispense Refill   acetaminophen (TYLENOL) 500 MG tablet Take 500 mg by mouth every 8 (eight) hours as needed for moderate pain.     baclofen (LIORESAL) 20 MG tablet Take 1 tablet (20 mg total) by mouth 3 (three) times daily. 90 tablet 0   DULoxetine (CYMBALTA) 30 MG capsule Take 1 capsule (30 mg total) by mouth daily. 90 capsule 0   feeding supplement (ENSURE ENLIVE / ENSURE PLUS) LIQD Take 237 mLs by mouth 3 (three) times daily between meals. 237 mL 12   gabapentin (NEURONTIN) 400 MG capsule Take 1 capsule (400 mg total) by mouth 3 (three) times daily. 90 capsule 0   lidocaine (HM LIDOCAINE PATCH) 4 % Place 1 patch onto the skin daily. 15 patch 0   melatonin 3 MG TABS tablet Take 3 mg by mouth at bedtime.     metroNIDAZOLE (METROCREAM) 0.75 % cream Apply 1 Application topically 2 (two) times daily.     natalizumab (TYSABRI) 300 MG/15ML injection Inject 15 mLs (300 mg total) into the vein every 28 (twenty-eight) days. 15 mL 5   oxybutynin (DITROPAN-XL) 10 MG 24 hr tablet Take 10 mg by mouth daily.     phenazopyridine (PYRIDIUM) 200 MG tablet Take 1 tablet (200 mg total) by mouth 3 (three) times daily. 6 tablet 0  QUEtiapine (SEROQUEL) 200 MG tablet Take 1 tablet (200 mg total) by mouth at bedtime. Take a half tablet at night for 4 days then increase to a whole tablet each night. (Patient taking differently: Take 200 mg by mouth at bedtime.) 90 tablet 2   Vitamin D, Ergocalciferol, (DRISDOL) 1.25 MG (50000 UNIT) CAPS capsule Take 1 capsule (50,000 Units total) by mouth every 7 (seven) days. 13 capsule 3     Scheduled Meds: Continuous Infusions: PRN Meds:.  Allergies:  Allergies  Allergen Reactions   Lithium Rash    Family History   Adopted: Yes  Problem Relation Age of Onset   Lupus Mother    Ataxia Neg Hx    Chorea Neg Hx    Dementia Neg Hx    Mental retardation Neg Hx    Migraines Neg Hx    Multiple sclerosis Neg Hx    Neurofibromatosis Neg Hx    Neuropathy Neg Hx    Parkinsonism Neg Hx    Seizures Neg Hx    Stroke Neg Hx     Social History:  reports that he has been smoking cigarettes. He has been smoking an average of 1 pack per day. He has never used smokeless tobacco. He reports that he does not currently use alcohol after a past usage of about 72.0 standard drinks of alcohol per week. He reports that he does not currently use drugs after having used the following drugs: Marijuana. Frequency: 7.00 times per week.  ROS: A complete review of systems was performed.  All systems are negative except for pertinent findings as noted.  Physical Exam:  Vital signs in last 24 hours: Temp:  [98.2 F (36.8 C)] 98.2 F (36.8 C) (06/06 1418) Pulse Rate:  [99] 99 (06/06 1418) Resp:  [18] 18 (06/06 1418) BP: (127)/(84) 127/84 (06/06 1418) SpO2:  [97 %] 97 % (06/06 1418) Constitutional:  Alert and oriented, No acute distress Cardiovascular: Regular rate and rhythm, No JVD Respiratory: Normal respiratory effort, Lungs clear bilaterally GI: Abdomen is soft, nontender, nondistended, no abdominal masses GU: New 20Fr SPT in place draining clear yellow urine; erythema and fibrinous exudate at periphery of SPT tract Lymphatic: No lymphadenopathy Neurologic: Grossly intact, no focal deficits Psychiatric: Normal mood and affect  Laboratory Data:  No results for input(s): "WBC", "HGB", "HCT", "PLT" in the last 72 hours.  No results for input(s): "NA", "K", "CL", "GLUCOSE", "BUN", "CALCIUM", "CREATININE" in the last 72 hours.  Invalid input(s): "CO3"   No results found for this or any previous visit (from the past 24 hour(s)). Recent Results (from the past 240 hour(s))  Blood culture (routine x 2)     Status: None    Collection Time: 11/15/22  1:21 PM   Specimen: BLOOD  Result Value Ref Range Status   Specimen Description BLOOD RIGHT ANTECUBITAL  Final   Special Requests   Final    BOTTLES DRAWN AEROBIC AND ANAEROBIC Blood Culture results may not be optimal due to an excessive volume of blood received in culture bottles   Culture   Final    NO GROWTH 5 DAYS Performed at [facility], [address] [place], [place] [address]    Report Status 11/20/2022 FINAL  Final    Renal Function: No results for input(s): "CREATININE" in the last 168 hours. Estimated Creatinine Clearance: 117.8 mL/min (by C-G formula based on SCr of 0.78 mg/dL).  Radiologic Imaging: No results found.  I independently reviewed the above imaging studies.  Impression/Recommendation #  SPT Malfunction Suspect obstruction related to significant calcification 20Fr 2-way replaced with ease, 8cc sterile water in balloon Recommend short course abx for prophylaxis against infection, 3-5d Keflex would suffice Proceed with more frequent catheter exchanges to prevent further encrustation (q2-3 weeks) Patient to follow-up with Dr. Alvester Morin for cysto + botox and SPT exchange as previously requested  Isreal Haubner 11/23/2022, 5:27 PM

## 2022-11-23 NOTE — ED Notes (Addendum)
[facility] called for pt transport. 

## 2022-11-23 NOTE — Telephone Encounter (Signed)
LVM for [facility] to call w/ update on pt. Advised our office closes at 5pm today and not here tomorrow.

## 2022-11-23 NOTE — ED Provider Notes (Addendum)
[place] EMERGENCY DEPARTMENT AT [facility] Provider Note   CSN: [id number] Arrival date & time: 11/23/22  1414     History  Chief Complaint  Patient presents with   Urinary Retention    Bradley Brandt is a 42 y.o. male.  HPI   Patient has a history of multiple sclerosis, bipolar disorder.  He also has history of urinary retention and a suprapubic catheter.  Patient presents to the ER with complaints of malfunctioning suprapubic catheter.  Patient states that has not been draining through the catheter.  He has been having some urine coming through his penis.  He denies any abdominal pain.  No fevers or chills  Home Medications Prior to Admission medications   Medication Sig Start Date End Date Taking? Authorizing Provider  acetaminophen (TYLENOL) 500 MG tablet Take 500 mg by mouth every 8 (eight) hours as needed for moderate pain.    [provider]  baclofen (LIORESAL) 20 MG tablet Take 1 tablet (20 mg total) by mouth 3 (three) times daily. 08/18/22   Donell Beers, FNP  DULoxetine (CYMBALTA) 30 MG capsule Take 1 capsule (30 mg total) by mouth daily. 07/22/22   Donell Beers, FNP  feeding supplement (ENSURE ENLIVE / ENSURE PLUS) LIQD Take 237 mLs by mouth 3 (three) times daily between meals. 08/10/22   Mapp, Gaylyn Cheers, MD  gabapentin (NEURONTIN) 400 MG capsule Take 1 capsule (400 mg total) by mouth 3 (three) times daily. 08/10/22   Mapp, Gaylyn Cheers, MD  lidocaine (HM LIDOCAINE PATCH) 4 % Place 1 patch onto the skin daily. 10/12/21   Passmore, Enid Derry I, NP  melatonin 3 MG TABS tablet Take 3 mg by mouth at bedtime.    [provider]  metroNIDAZOLE (METROCREAM) 0.75 % cream Apply 1 Application topically 2 (two) times daily.    [provider]  natalizumab (TYSABRI) 300 MG/15ML injection Inject 15 mLs (300 mg total) into the vein every 28 (twenty-eight) days. 09/05/16   Sater, Pearletha Furl, MD  oxybutynin (DITROPAN-XL) 10 MG 24 hr tablet Take 10 mg by  mouth daily. 10/09/22   [provider]  phenazopyridine (PYRIDIUM) 200 MG tablet Take 1 tablet (200 mg total) by mouth 3 (three) times daily. 09/14/22   Mardene Sayer, MD  QUEtiapine (SEROQUEL) 200 MG tablet Take 1 tablet (200 mg total) by mouth at bedtime. Take a half tablet at night for 4 days then increase to a whole tablet each night. Patient taking differently: Take 200 mg by mouth at bedtime. 07/22/22   Paseda, Soward Kay, FNP  Vitamin D, Ergocalciferol, (DRISDOL) 1.25 MG (50000 UNIT) CAPS capsule Take 1 capsule (50,000 Units total) by mouth every 7 (seven) days. 06/29/22   Sater, Pearletha Furl, MD      Allergies    Lithium    Review of Systems   Review of Systems  Physical Exam Updated Vital Signs BP 127/84 (BP Location: Right Arm)   Pulse 99   Temp 98.2 F (36.8 C) (Oral)   Resp 18   SpO2 97%  Physical Exam Vitals and nursing note reviewed.  Constitutional:      General: He is not in acute distress.    Appearance: He is well-developed.  HENT:     Head: Normocephalic and atraumatic.     Right Ear: External ear normal.     Left Ear: External ear normal.  Eyes:     General: No scleral icterus.       Right eye: No  discharge.        Left eye: No discharge.     Conjunctiva/sclera: Conjunctivae normal.  Neck:     Trachea: No tracheal deviation.  Cardiovascular:     Rate and Rhythm: Normal rate.  Pulmonary:     Effort: Pulmonary effort is normal. No respiratory distress.     Breath sounds: No stridor.  Abdominal:     General: There is no distension.  Genitourinary:    Comments: Suprapubic catheter in place, catheter tube is dry, no urine noted in the catheter bag Musculoskeletal:        General: No swelling or deformity.     Cervical back: Neck supple.  Skin:    General: Skin is warm and dry.     Findings: No rash.  Neurological:     Mental Status: He is alert. Mental status is at baseline.     Cranial Nerves: No dysarthria or facial asymmetry.      Motor: No seizure activity.     ED Results / Procedures / Treatments   Labs (all labs ordered are listed, but only abnormal results are displayed) Labs Reviewed - No data to display  EKG None  Radiology No results found.  Procedures Procedures   I attempted to remove the patient's suprapubic catheter in order to place a new one.  Balloon deflated without difficulty.  However there is resistance when attempting to remove the catheter.    Procedure stopped.  Catheter still in place, slides easily.  Will consult with urology.  Catheter balloon re inflated with 8 cc (same amount removed) without difficulty Medications Ordered in ED Medications  lidocaine (XYLOCAINE) 2 % jelly 1 Application (1 Application Topical Given 11/23/22 1545)    ED Course/ Medical Decision Making/ A&P Clinical Course as of 11/23/22 1632  Thu Nov 23, 2022  1611 Reviewed case with Dr Benancio Deeds. [JK]    Clinical Course User Index [JK] Linwood Dibbles, MD                             Medical Decision Making Risk Prescription drug management.   Patient presented to the ED with complaints of low urinary output from his suprapubic catheter.  Patient recently had IR exchange last month.  Patient states he is draining some urine through the penis.  His abdomen is not distended.  Does not have any tenderness.  Does not appear to be in retention.  Case discussed with urology.  Will come assess.  I have asked the nurse to attempt to flush and proceed with bladder scan to confirm no retention.  Bladder scan showed 80cc        Final Clinical Impression(s) / ED Diagnoses Final diagnoses:  Suprapubic catheter dysfunction, initial encounter [facility])    Rx / DC Orders ED Discharge Orders     None         Linwood Dibbles, MD 11/23/22 1633    Linwood Dibbles, MD 11/23/22 [phone] Urology saw pt and was able to replace the catheter.   Linwood Dibbles, MD 11/23/22 [phone]

## 2022-11-23 NOTE — ED Triage Notes (Signed)
Pt BIBA from [facility] with c/o urinary retention. Pt has a suprapubic catheter that does not drain properly. Hx MS. Catheter replaced on 5/29 at [facility]. Reports catheter is leaking at site. Denies having pain at the moment, only discomfort when urinating.   BP 116/58 HR 96 RR 20 92% RA CBG 123

## 2022-11-24 ENCOUNTER — Telehealth: Payer: Self-pay

## 2022-11-24 DIAGNOSIS — G35 Multiple sclerosis: Secondary | ICD-10-CM | POA: Diagnosis not present

## 2022-11-24 NOTE — Transitions of Care (Post Inpatient/ED Visit) (Signed)
   11/24/2022  Name: MAKIH STEFANKO MRN: [id number] DOB: September 26, 1980  Today's TOC FU Call Status: Unsuccessful Call (1st Attempt) Date: 11/24/22  Attempted to reach the patient regarding the most recent Inpatient/ED visit.  Follow Up Plan: Additional outreach attempts will be made to reach the patient to complete the Transitions of Care (Post Inpatient/ED visit) call.   Signature Renelda Loma RMA

## 2022-11-27 ENCOUNTER — Telehealth: Payer: Self-pay

## 2022-11-27 NOTE — Transitions of Care (Post Inpatient/ED Visit) (Signed)
   11/27/2022  Name: Bradley Brandt MRN: [id number] DOB: [birth year]  Today's TOC FU Call Status: Today's TOC FU Call Status:: Unsuccessful Call (2nd Attempt) Unsuccessful Call (2nd Attempt) Date: 11/27/22  Attempted to reach the patient regarding the most recent Inpatient/ED visit.  Follow Up Plan: Additional outreach attempts will be made to reach the patient to complete the Transitions of Care (Post Inpatient/ED visit) call.   Signature Renelda Loma RMA

## 2022-11-28 ENCOUNTER — Encounter: Payer: Self-pay | Admitting: Neurology

## 2022-11-28 ENCOUNTER — Telehealth: Payer: Self-pay

## 2022-11-28 NOTE — Telephone Encounter (Signed)
Took call from phone staff and spoke w/ Vernona Rieger at [facility] center. Pt is scheduled for Tysabri infusion tomorrow at 9am, they coordinated with facility.   I called father at [phone] and LVM with update above. Asked him to call back if he has any questions.

## 2022-11-28 NOTE — Telephone Encounter (Signed)
Called and spoke w/ Vernona Rieger at [facility]. Tysabri med should arrive today. They will call facility to schedule once med arrives at [phone]. She will call our office back to let us know appt date/time so we can then inform father.

## 2022-11-28 NOTE — Transitions of Care (Post Inpatient/ED Visit) (Cosign Needed)
   11/28/2022  Name: KAYSAN PEIXOTO MRN: [id number] DOB: 07/25/1980  Today's TOC FU Call Status: Today's TOC FU Call Status:: Unsuccessful Call (3rd Attempt) Unsuccessful Call (3rd Attempt) Date: 11/28/22  Attempted to reach the patient regarding the most recent Inpatient/ED visit.  Follow Up Plan: No further outreach attempts will be made at this time. We have been unable to contact the patient.  Signature Renelda Loma RMA

## 2022-11-29 ENCOUNTER — Ambulatory Visit (INDEPENDENT_AMBULATORY_CARE_PROVIDER_SITE_OTHER): Payer: 59

## 2022-11-29 VITALS — BP 98/58 | HR 60 | Temp 98.0°F | Resp 20 | Ht 71.0 in | Wt 147.0 lb

## 2022-11-29 DIAGNOSIS — G35D Multiple sclerosis, unspecified: Secondary | ICD-10-CM

## 2022-11-29 DIAGNOSIS — G35 Multiple sclerosis: Secondary | ICD-10-CM

## 2022-11-29 MED ORDER — LORATADINE 10 MG PO TABS
10.0000 mg | ORAL_TABLET | Freq: Once | ORAL | Status: AC
  Administered 2022-11-29: 10 mg via ORAL
  Filled 2022-11-29: qty 1

## 2022-11-29 MED ORDER — SODIUM CHLORIDE 0.9 % IV SOLN
300.0000 mg | Freq: Once | INTRAVENOUS | Status: AC
  Administered 2022-11-29: 300 mg via INTRAVENOUS
  Filled 2022-11-29: qty 15

## 2022-11-29 MED ORDER — ACETAMINOPHEN 325 MG PO TABS
650.0000 mg | ORAL_TABLET | Freq: Once | ORAL | Status: AC
  Administered 2022-11-29: 650 mg via ORAL
  Filled 2022-11-29: qty 2

## 2022-11-29 MED ORDER — SODIUM CHLORIDE 0.9 % IV SOLN: INTRAVENOUS | Status: DC

## 2022-11-29 NOTE — Progress Notes (Signed)
Diagnosis: Multiple Sclerosis  Provider:  Chilton Greathouse MD  Procedure: IV Infusion  IV Type: Peripheral, IV Location: R Forearm  Tysabri (Natalizumab), Dose: 300 mg  Infusion Start Time: 1038  Infusion Stop Time: 1142  Post Infusion IV Care: Patient declined observation and Peripheral IV Discontinued  Discharge: Condition: Good, Destination: Home . AVS Provided  Performed by:  Adriana Mccallum, RN

## 2022-12-01 DIAGNOSIS — G35 Multiple sclerosis: Secondary | ICD-10-CM | POA: Diagnosis not present

## 2022-12-27 ENCOUNTER — Ambulatory Visit: Payer: 59

## 2022-12-27 VITALS — BP 96/65 | HR 74 | Temp 98.0°F | Resp 16

## 2022-12-27 DIAGNOSIS — G35 Multiple sclerosis: Secondary | ICD-10-CM

## 2022-12-27 MED ORDER — SODIUM CHLORIDE 0.9 % IV SOLN
300.0000 mg | Freq: Once | INTRAVENOUS | Status: DC
  Filled 2022-12-27: qty 15

## 2022-12-27 MED ORDER — ACETAMINOPHEN 325 MG PO TABS: 650.0000 mg | ORAL_TABLET | Freq: Once | ORAL | Status: DC

## 2022-12-27 MED ORDER — SODIUM CHLORIDE 0.9 % IV SOLN: INTRAVENOUS | Status: DC

## 2022-12-27 MED ORDER — LORATADINE 10 MG PO TABS: 10.0000 mg | ORAL_TABLET | Freq: Once | ORAL | Status: DC

## 2022-12-27 NOTE — Progress Notes (Signed)
Pt brought in by transportation and left for infusion. Pt sleeping. Difficult to arouse. Unable to answer questions or give consent for treatment. Only opens eyes for less than 3 seconds, and will occasionally nod, but inappropriate to questions. Called emergency contact and spoke with pts mother. Informed of pts current state and that pt unable to get infusion on today. Verbalized understanding. Called facility and spoke with staff nurse. Informed of the same. Spoke with Tomika from [facility]. Visit rescheduled. Transportation contacted for pickup.

## 2022-12-28 DIAGNOSIS — G35 Multiple sclerosis: Secondary | ICD-10-CM | POA: Diagnosis not present

## 2023-01-02 ENCOUNTER — Encounter: Payer: Self-pay | Admitting: Neurology

## 2023-01-03 ENCOUNTER — Ambulatory Visit (INDEPENDENT_AMBULATORY_CARE_PROVIDER_SITE_OTHER): Payer: 59

## 2023-01-03 VITALS — BP 99/60 | HR 67 | Temp 98.4°F | Resp 18 | Ht 68.0 in | Wt 180.0 lb

## 2023-01-03 DIAGNOSIS — G35 Multiple sclerosis: Secondary | ICD-10-CM | POA: Diagnosis not present

## 2023-01-03 MED ORDER — SODIUM CHLORIDE 0.9 % IV SOLN: INTRAVENOUS | Status: DC

## 2023-01-03 MED ORDER — LORATADINE 10 MG PO TABS
10.0000 mg | ORAL_TABLET | Freq: Once | ORAL | Status: AC
  Administered 2023-01-03: 10 mg via ORAL
  Filled 2023-01-03: qty 1

## 2023-01-03 MED ORDER — ACETAMINOPHEN 325 MG PO TABS
650.0000 mg | ORAL_TABLET | Freq: Once | ORAL | Status: AC
  Administered 2023-01-03: 650 mg via ORAL
  Filled 2023-01-03: qty 2

## 2023-01-03 MED ORDER — SODIUM CHLORIDE 0.9 % IV SOLN
300.0000 mg | Freq: Once | INTRAVENOUS | Status: AC
  Administered 2023-01-03: 300 mg via INTRAVENOUS
  Filled 2023-01-03: qty 15

## 2023-01-03 NOTE — Progress Notes (Signed)
Diagnosis: Multiple Sclerosis  Provider:  Chilton Greathouse MD  Procedure: IV Infusion  IV Type: Peripheral, IV Location: L Forearm  Tysabri (Natalizumab), Dose: 300 mg  Infusion Start Time: 1049  Infusion Stop Time: 1152  Post Infusion IV Care: Observation period completed and Peripheral IV Discontinued  Discharge: Condition: Good, Destination: Home . AVS Provided  Performed by:  Loney Hering, LPN

## 2023-01-04 DIAGNOSIS — G35 Multiple sclerosis: Secondary | ICD-10-CM | POA: Diagnosis not present

## 2023-01-07 NOTE — Progress Notes (Addendum)
[name]  PATIENT: Bradley Brandt DOB: 07-11-1980  REFERRING DOCTOR OR PCP:  Thomes Dinning SOURCE: patient, records form Dr. Gaynelle Adu, MRI reports and images on PACS  _________________________________   HISTORICAL  CHIEF COMPLAINT:  Chief Complaint  Patient presents with   Room 10    Pt is here with his Caregiver. Pt is doing Pt and is able to walk in Physical Therapy. Pt states that he is doing much better.     HISTORY OF PRESENT ILLNESS:  Bradley Brandt is a 42 y.o.  man with relapsing remitting multiple sclerosis.     Update 01/09/2023   MS: He is back on Tysabri and tolerating it well.   His last infusion was 01/01/2023.  He had missed several infusions and presented to the ED 07/11/2022 with an apparent rebound with an MRI showing multiple enbancing lesions in the brain.   There were multiple old spinal cord lesions and an enhancing new focus at T7.   He received 5 days of IV Solumedrol and was discharged.    He got his Tysabri infusion 07/26/2022 (looks like he had previous infusions 04/21/2022 and 03/25/2022)   We discussed Ocrevus or Kesimpta (as might improve compliance). Repeat imaging 07/31/2022 showed a new lest pontomedullary junction and new right centrum semiovale lesion compared to 07/12/2022.   MRI of the cervical and thoracic spine was unchanged.     We discussed the importance of compliance as the benefit will wear off after 2-3 months if doses are skipped.     He improved but not to baseline and is now able to just take a few steps with a walker.  He mostly spends time in wheelchair.  Balance affects him more than weakness.   Before the series of exacerbations of the beginning the year, he was able to walk 100 feet but is unable to walk more than a few feet now.   Left foot drop has worsened and he now has trouble raising the left leg.  Balance is very poor and he cannot stand without strong bilateral support and cannot use a walker.[name]    He denies  arm weakness or spasticity but is noted to have weakness in the left arm when tested   no numbness or tingling   Bladder function is the same with some urgency as well as hesitancy (helped by tamsulosin)     He also has ED.  He is on baclofen for spasticity and dalfampridine for the walking.      Vision is doing the same.     He feels fatigued but this is stable.   He notes some depression, similar to earlier in the year.   He has more anxiety.   He is seeing psychiatry again.   He was diagnosed with bipolar disease   He is sleeping worse.[name]   He notes cognition is about the same.  Focus is sometimes poor.         MS history: Bradley Brandt was diagnosed around 2001 when he presented with right-sided numbness and clumsiness and weakness.   Initially, he was on Avonex and then Rebif but had relapses on both of them.  He could not tolerate Tecfidera.   He was started on Tysabri about 1 year ago and his last infusion was at [facility] at 11/08/2015.  He was seen Dr. Gaynelle Adu [facility] and those records were reviewed. He did receive a course of Solu-Medrol March 2015 and another course of  Solu-Medrol January 2016 when he presented with left leg weakness and numbness that was new    An MRI of the brain 08/28/2013 personally reviewed shows old extensive white matter changes in both hemispheres with multiple sclerosis. There were new areas of restricted diffusion without definite enhancement that could be consistent with ongoing demyelination when compared to the MRI from 05/13/2013.  He missed 2-3 doses of Tysabri December 2023 in January 2023 and had a rebound with multiple enhancing lesions towards the end of January 2024.  He was treated with high-dose steroids and Tysabri was resumed.   We had also discussed anti-CD20 agents if compliance is difficult.   Imaging: MRI of the brain 08/30/2019 showed multiple T2/FLAIR hyperintense foci in the hemispheres, pons, cerebellum and thalamus in a pattern  and configuration consistent with chronic demyelinating plaque associated with multiple sclerosis.  None of the foci appear to be acute and they do not enhance.  Compared to the MRI dated 01/14/2019, there is no interval progression.  MRI of the cervical spine 01/06/2018 showed T2 hyperintense foci within the spinal cord posteriorly adjacent to C2, to the right adjacent to C3, to the left adjacent to C4 and centrally and to the right adjacent to C4-C5 and C5.   None of these appear to be acute and they were all present on the 2015 MRI.[name]    MRI of the brain 01/06/2018 showed multiple T2/FLAIR hyperintense foci in the hemispheres and in the left middle cerebellar peduncle consistent with chronic demyelinating plaque associated with multiple sclerosis.  None of the foci appears to be acute.  None of them enhance.  However, when compared to the 2015 MRI focus in the cerebellum and a couple foci in the hemispheres were not clearly present on the earlier MRI.    MRI of the brain and spine 07/12/2022 showed MRI showing multiple enbancing lesions in the brain.   There were multiple old spinal cord lesions and an enhancing new focus at T7.  Repeat imaging 07/31/2022 showed a new lest pontomedullary junction and new right centrum semiovale lesion compared to 07/12/2022.   MRI of the cervical and thoracic spine was unchanged   REVIEW OF SYSTEMS: Constitutional: No fevers, chills, sweats, or change in appetite.   Notes fatigue Eyes: No visual changes, double vision, eye pain Ear, nose and throat: No hearing loss, ear pain, nasal congestion, sore throat Cardiovascular: No chest pain, palpitations Respiratory:  No shortness of breath at rest or with exertion.   No wheezes GastrointestinaI: No nausea, vomiting, diarrhea, abdominal pain, fecal incontinence Genitourinary:  He has hesitancy and frequency.   No UTI's. Musculoskeletal:  No neck pain, back pain Integumentary: No rash, pruritus, skin lesions Neurological:  as above Psychiatric: No depression at this time.  No anxiety Endocrine: No palpitations, diaphoresis, change in appetite, change in weigh or increased thirst Hematologic/Lymphatic:  No anemia, purpura, petechiae. Allergic/Immunologic: No itchy/runny eyes, nasal congestion, recent allergic reactions, rashes  ALLERGIES: Allergies  Allergen Reactions   Lithium Rash    HOME MEDICATIONS:  Current Outpatient Medications:    acetaminophen (TYLENOL) 500 MG tablet, Take 500 mg by mouth every 8 (eight) hours as needed for moderate pain., Disp: , Rfl:    DULoxetine (CYMBALTA) 30 MG capsule, Take 1 capsule (30 mg total) by mouth daily., Disp: 90 capsule, Rfl: 0   feeding supplement (ENSURE ENLIVE / ENSURE PLUS) LIQD, Take 237 mLs by mouth 3 (three) times daily between meals., Disp: 237 mL, Rfl: 12   gabapentin (NEURONTIN)  400 MG capsule, Take 1 capsule (400 mg total) by mouth 3 (three) times daily., Disp: 90 capsule, Rfl: 0   lidocaine (HM LIDOCAINE PATCH) 4 %, Place 1 patch onto the skin daily., Disp: 15 patch, Rfl: 0   melatonin 3 MG TABS tablet, Take 3 mg by mouth at bedtime., Disp: , Rfl:    natalizumab (TYSABRI) 300 MG/15ML injection, Inject 15 mLs (300 mg total) into the vein every 28 (twenty-eight) days., Disp: 15 mL, Rfl: 5   oxybutynin (DITROPAN-XL) 10 MG 24 hr tablet, Take 10 mg by mouth daily., Disp: , Rfl:    phenazopyridine (PYRIDIUM) 200 MG tablet, Take 1 tablet (200 mg total) by mouth 3 (three) times daily., Disp: 6 tablet, Rfl: 0   QUEtiapine (SEROQUEL) 200 MG tablet, Take 1 tablet (200 mg total) by mouth at bedtime. Take a half tablet at night for 4 days then increase to a whole tablet each night. (Patient taking differently: Take 200 mg by mouth at bedtime.), Disp: 90 tablet, Rfl: 2   Vitamin D, Ergocalciferol, (DRISDOL) 1.25 MG (50000 UNIT) CAPS capsule, Take 1 capsule (50,000 Units total) by mouth every 7 (seven) days., Disp: 13 capsule, Rfl: 3   baclofen (LIORESAL) 20 MG tablet,  Take 1 tablet (20 mg total) by mouth 3 (three) times daily., Disp: 90 tablet, Rfl: 11   metroNIDAZOLE (METROCREAM) 0.75 % cream, Apply 1 Application topically 2 (two) times daily., Disp: , Rfl:   PAST MEDICAL HISTORY: Past Medical History:  Diagnosis Date   Bipolar 1 disorder (HCC)    Bipolar disorder, unspecified (HCC) 01/03/2013   MS (multiple sclerosis) (HCC) dx 2006   relapsing-remitting right sided weakness   Neuromuscular disorder (HCC)    MS    PAST SURGICAL HISTORY: Past Surgical History:  Procedure Laterality Date   IR CATHETER TUBE CHANGE  10/31/2022   IR CATHETER TUBE CHANGE  11/02/2022   IR FLUORO GUIDED NEEDLE PLC ASPIRATION/INJECTION LOC  09/07/2022   TUMOR REMOVAL  1983   abdomen benign    FAMILY HISTORY: Family History  Adopted: Yes  Problem Relation Age of Onset   Lupus Mother    Ataxia Neg Hx    Chorea Neg Hx    Dementia Neg Hx    Mental retardation Neg Hx    Migraines Neg Hx    Multiple sclerosis Neg Hx    Neurofibromatosis Neg Hx    Neuropathy Neg Hx    Parkinsonism Neg Hx    Seizures Neg Hx    Stroke Neg Hx     SOCIAL HISTORY:  Social History   Socioeconomic History   Marital status: Single    Spouse name: Not on file   Number of children: Not on file   Years of education: Not on file   Highest education level: Not on file  Occupational History   Not on file  Tobacco Use   Smoking status: Every Day    Current packs/day: 1.00    Types: Cigarettes   Smokeless tobacco: Never  Vaping Use   Vaping status: Former  Substance and Sexual Activity   Alcohol use: Not Currently    Alcohol/week: 72.0 standard drinks of alcohol    Types: 10 Glasses of wine, 12 Cans of beer, 50 Shots of liquor per week   Drug use: Not Currently    Frequency: 7.0 times per week    Types: Marijuana    Comment: daily   Sexual activity: Yes    Partners: Female    Birth  control/protection: None  Other Topics Concern   Not on file  Social History Narrative   Not  on file   Social Determinants of Health   Financial Resource Strain: Not on file  Food Insecurity: Patient Declined (10/30/2022)   Hunger Vital Sign    Worried About Running Out of Food in the Last Year: Patient declined    Ran Out of Food in the Last Year: Patient declined  Transportation Needs: No Transportation Needs (10/30/2022)   PRAPARE - Administrator, Civil Service (Medical): No    Lack of Transportation (Non-Medical): No  Physical Activity: Not on file  Stress: Not on file  Social Connections: Unknown (10/17/2021)   Received from [facility]   Social Network    Social Network: Not on file  Intimate Partner Violence: Not At Risk (10/30/2022)   Humiliation, Afraid, Rape, and Kick questionnaire    Fear of Current or Ex-Partner: No    Emotionally Abused: No    Physically Abused: No    Sexually Abused: No     PHYSICAL EXAM  Vitals:   01/09/23 1508  BP: (!) 115/53  Pulse: (!) 59  Weight: 172 lb (78 kg)  Height: 5\' 11"  (1.803 m)     Body mass index is 23.99 kg/m.   General: The patient is well-developed and well-nourished and in no acute distress.  Has indwelling catheter   Neurologic Exam   Mental status: The patient is alert and oriented x 3 at the time of the examination. The patient has apparent normal recent and remote memory, with an apparently normal attention span and concentration ability.   Speech is normal.   Cranial nerves: Extraocular movements are full.   2+ right APD.   Reduced visual acuity and reduced color vision OD     Facial strength is normal. Trapezius strength is strong.  No obvious hearing deficits are noted.   Motor:  Muscle bulk is normal.   Slowed rapid altering movements in left  hand.  There is increased muscle tone in the left arm and both legs  Strength is 5/5 in the right arm, for a left triceps and 4+/5 in the left arm.  And 4-/5 in the right leg and 4/5 in the left leg.      Sensory: He reports symmetric sensationin  arms bu reduced left leg vibration sensation.  TOuch is symmetric.      Coordination: Cerebellar testing reveals mildly reduced finger-nose-finger and very poor left heel-to-shin   Gait and station: He is unable to stand without bilateral support.  He can stand with support and take a few steps with bilat supportRomberg is borderline   Reflexes: Deep tendon reflexes are increased with spread at the knees.  He has nonsustained clonus at the ankles       ____________________________________________  Relapsing remitting multiple sclerosis (HCC) - Plan: Stratify JCV Antibody Test ([facility]), CBC with Differential/Platelet  Chronic pain syndrome - Plan: baclofen (LIORESAL) 20 MG tablet  Spastic gait  Ataxia  Bipolar affective disorder, remission status unspecified (HCC)  Urinary hesitancy  High risk medication use - Plan: Stratify JCV Antibody Test ([facility]), CBC with Differential/Platelet  1.  For now continue Tysabri   We also discussed the anti-CD20 agents f JCV converts or transportation issues.   check JCV today.   2.   Continue gabapentin for dysesthesia and back pain. 3.   Stay active.   Exercise as tolerated 4.   Suggest he talk to urology abou leakae  around catheter.   5.   Due to the worsening gait and inability to take more than a couple steps, he needs a power wheelchair.   Return in 6 months or sooner if new or worsening neurologic symptoms.    This visit is part of a comprehensive longitudinal care medical relationship regarding the patients primary diagnosis of multiple sclerosis and related concerns.   Antionetta Ator A. Epimenio Foot, MD, PhD 01/09/2023, 3:48 PM Certified in Neurology, Clinical Neurophysiology, Sleep Medicine, Pain Medicine and Neuroimaging  [facility] [address] [place], [place] [address] [phone]

## 2023-01-09 ENCOUNTER — Encounter: Payer: Self-pay | Admitting: Neurology

## 2023-01-09 ENCOUNTER — Telehealth: Payer: Self-pay

## 2023-01-09 ENCOUNTER — Ambulatory Visit (INDEPENDENT_AMBULATORY_CARE_PROVIDER_SITE_OTHER): Payer: 59 | Admitting: Neurology

## 2023-01-09 VITALS — BP 115/53 | HR 59 | Ht 71.0 in | Wt 172.0 lb

## 2023-01-09 DIAGNOSIS — Z79899 Other long term (current) drug therapy: Secondary | ICD-10-CM

## 2023-01-09 DIAGNOSIS — R270 Ataxia, unspecified: Secondary | ICD-10-CM

## 2023-01-09 DIAGNOSIS — F319 Bipolar disorder, unspecified: Secondary | ICD-10-CM | POA: Diagnosis not present

## 2023-01-09 DIAGNOSIS — G35 Multiple sclerosis: Secondary | ICD-10-CM

## 2023-01-09 DIAGNOSIS — R3911 Hesitancy of micturition: Secondary | ICD-10-CM

## 2023-01-09 DIAGNOSIS — G894 Chronic pain syndrome: Secondary | ICD-10-CM

## 2023-01-09 DIAGNOSIS — R261 Paralytic gait: Secondary | ICD-10-CM | POA: Diagnosis not present

## 2023-01-09 DIAGNOSIS — H5213 Myopia, bilateral: Secondary | ICD-10-CM | POA: Diagnosis not present

## 2023-01-09 MED ORDER — BACLOFEN 20 MG PO TABS
20.0000 mg | ORAL_TABLET | Freq: Three times a day (TID) | ORAL | 11 refills | Status: DC
Start: 2023-01-09 — End: 2023-01-17

## 2023-01-09 NOTE — Telephone Encounter (Signed)
Placed JCV in [facility] Box 01/09/2023

## 2023-01-10 LAB — CBC WITH DIFFERENTIAL/PLATELET
Basophils Absolute: 0.1 10*3/uL (ref 0.0–0.2)
Basos: 1 %
EOS (ABSOLUTE): 0.3 10*3/uL (ref 0.0–0.4)
Eos: 3 %
Hematocrit: 37.8 % (ref 37.5–51.0)
Hemoglobin: 12.5 g/dL — ABNORMAL LOW (ref 13.0–17.7)
Immature Grans (Abs): 0.1 10*3/uL (ref 0.0–0.1)
Immature Granulocytes: 1 %
Lymphocytes Absolute: 3.3 10*3/uL — ABNORMAL HIGH (ref 0.7–3.1)
Lymphs: 38 %
MCH: 30.6 pg (ref 26.6–33.0)
MCHC: 33.1 g/dL (ref 31.5–35.7)
MCV: 93 fL (ref 79–97)
Monocytes Absolute: 0.4 10*3/uL (ref 0.1–0.9)
Monocytes: 5 %
NRBC: 1 % — ABNORMAL HIGH (ref 0–0)
Neutrophils Absolute: 4.6 10*3/uL (ref 1.4–7.0)
Neutrophils: 52 %
Platelets: 239 10*3/uL (ref 150–450)
RBC: 4.08 x10E6/uL — ABNORMAL LOW (ref 4.14–5.80)
RDW: 14.9 % (ref 11.6–15.4)
WBC: 8.7 10*3/uL (ref 3.4–10.8)

## 2023-01-17 ENCOUNTER — Encounter: Payer: Self-pay | Admitting: Nurse Practitioner

## 2023-01-17 ENCOUNTER — Other Ambulatory Visit: Payer: Self-pay | Admitting: Nurse Practitioner

## 2023-01-17 ENCOUNTER — Ambulatory Visit (INDEPENDENT_AMBULATORY_CARE_PROVIDER_SITE_OTHER): Payer: 59 | Admitting: Nurse Practitioner

## 2023-01-17 VITALS — BP 97/57 | HR 67 | Temp 97.0°F

## 2023-01-17 DIAGNOSIS — E559 Vitamin D deficiency, unspecified: Secondary | ICD-10-CM

## 2023-01-17 DIAGNOSIS — F319 Bipolar disorder, unspecified: Secondary | ICD-10-CM | POA: Diagnosis not present

## 2023-01-17 DIAGNOSIS — M792 Neuralgia and neuritis, unspecified: Secondary | ICD-10-CM

## 2023-01-17 DIAGNOSIS — G4709 Other insomnia: Secondary | ICD-10-CM

## 2023-01-17 DIAGNOSIS — G894 Chronic pain syndrome: Secondary | ICD-10-CM

## 2023-01-17 DIAGNOSIS — F411 Generalized anxiety disorder: Secondary | ICD-10-CM

## 2023-01-17 DIAGNOSIS — G35 Multiple sclerosis: Secondary | ICD-10-CM

## 2023-01-17 DIAGNOSIS — F1290 Cannabis use, unspecified, uncomplicated: Secondary | ICD-10-CM

## 2023-01-17 DIAGNOSIS — R261 Paralytic gait: Secondary | ICD-10-CM

## 2023-01-17 DIAGNOSIS — F17209 Nicotine dependence, unspecified, with unspecified nicotine-induced disorders: Secondary | ICD-10-CM | POA: Insufficient documentation

## 2023-01-17 DIAGNOSIS — Z7409 Other reduced mobility: Secondary | ICD-10-CM

## 2023-01-17 DIAGNOSIS — Z789 Other specified health status: Secondary | ICD-10-CM

## 2023-01-17 DIAGNOSIS — N3281 Overactive bladder: Secondary | ICD-10-CM

## 2023-01-17 MED ORDER — QUETIAPINE FUMARATE 200 MG PO TABS
200.0000 mg | ORAL_TABLET | Freq: Every day | ORAL | 1 refills | Status: DC
Start: 2023-01-17 — End: 2023-04-11

## 2023-01-17 MED ORDER — DULOXETINE HCL 30 MG PO CPEP
30.0000 mg | ORAL_CAPSULE | Freq: Every day | ORAL | 1 refills | Status: DC
Start: 2023-01-17 — End: 2024-03-03

## 2023-01-17 MED ORDER — GABAPENTIN 400 MG PO CAPS
400.0000 mg | ORAL_CAPSULE | Freq: Three times a day (TID) | ORAL | 4 refills | Status: DC
Start: 2023-01-17 — End: 2023-01-19

## 2023-01-17 MED ORDER — BACLOFEN 20 MG PO TABS
20.0000 mg | ORAL_TABLET | Freq: Three times a day (TID) | ORAL | 4 refills | Status: DC
Start: 2023-01-17 — End: 2023-01-17

## 2023-01-17 MED ORDER — MELATONIN 3 MG PO TABS
3.0000 mg | ORAL_TABLET | Freq: Every evening | ORAL | 1 refills | Status: DC | PRN
Start: 2023-01-17 — End: 2023-03-01

## 2023-01-17 MED ORDER — BACLOFEN 20 MG PO TABS
20.0000 mg | ORAL_TABLET | Freq: Three times a day (TID) | ORAL | 4 refills | Status: DC
Start: 2023-01-17 — End: 2023-04-20

## 2023-01-17 MED ORDER — OXYBUTYNIN CHLORIDE ER 10 MG PO TB24
10.0000 mg | ORAL_TABLET | Freq: Every day | ORAL | 1 refills | Status: DC
Start: 2023-01-17 — End: 2024-05-02

## 2023-01-17 NOTE — Assessment & Plan Note (Signed)
Continue duloxetine 30 mg daily, gabapentin 400 mg 3 times daily, baclofen 20 mg 3 times daily.  Does not want refill of lidocaine

## 2023-01-17 NOTE — Assessment & Plan Note (Signed)
Continue Cymbalta 30 mg daily Patient referred to psychiatrist

## 2023-01-17 NOTE — Addendum Note (Signed)
Addended by: Donell Beers on: 01/17/2023 12:44 PM   Modules accepted: Level of Service

## 2023-01-17 NOTE — Assessment & Plan Note (Signed)
Smokes marijuana daily.  Encouraged cessation

## 2023-01-17 NOTE — Assessment & Plan Note (Signed)
Melatonin 3 mg at bedtime as needed refilled

## 2023-01-17 NOTE — Assessment & Plan Note (Signed)
Smokes cigarettes daily did not see how much he smokes Need to avoid smoking cigarettes due to risk of lung cancer, COPD, heart disease discussed with the patient Encouraged to quit

## 2023-01-17 NOTE — Progress Notes (Addendum)
Established Patient Office Visit  Subjective:  Patient ID: Bradley Brandt, male    DOB: [birth year]  Age: 42 y.o. MRN: [id number]  CC:  Chief Complaint  Patient presents with   Establish Care   Hospitalization Follow-up    HPI Bradley Brandt is a 42 y.o. male  has a past medical history of Bipolar 1 disorder (HCC), Bipolar disorder, unspecified (HCC) (01/03/2013), MS (multiple sclerosis) (HCC) (dx 2006), and Neuromuscular disorder (HCC).  Patient presents to establish care for his chronic medical conditions.    Previous patient of Orion Crook NP, patient was last seen at this office over a year ago. Marland KitchenHe is accompanied by his father who assisted in providing history   Patient was on admission at the hospital for urinary tract infection in May 2024.  He was discharged to [facility] [place].  Stayed at SNF up to 01/10/2023 when he decided to leave AMA.  They stated that since he left AMA he was not given any medication when he was being discharged and has been out of his medication since then.  Patient is currently living home alone he is wheelchair dependent needs assistance with grooming, cooking is incontinence of bowel and bladder.  The stated that they have an appointment with home health agency today([facility])  Prior to going to rehab he was receiving home health care 7 days a week 3 hours daily.  They were told at SNF that he will need a 24-hour care.  His sister and father has been helping him at his home since he left rehab.    Multiple sclerosis.  He is being followed by neurology has a suprapubic catheter due to h complications from his multiple sclerosis.  He is currently not established with urology.  Referral sent today.  Takes baclofen and Neurontin for chronic pain   bipolar disorder, GAD.  He was previously seeing psychiatrist but not currently.  Referral sent to psychiatrist.  Seroquel 200 mg at bedtime, Cymbalta 30 mg daily refilled.   Takes gabapentin 400 mg 3 times daily for chronic pain.  Has brimonidine tartrate 0.2% solution listed on his medication list , patients dad stated that the patient is not on this medication and that he has not been using an eye drop,, medication was not filled today. They denies an eye concerns . Stated that he wears prescription glasses.   Past Medical History:  Diagnosis Date   Bipolar 1 disorder (HCC)    Bipolar disorder, unspecified (HCC) 01/03/2013   MS (multiple sclerosis) (HCC) dx 2006   relapsing-remitting right sided weakness   Neuromuscular disorder [facility])    MS    Past Surgical History:  Procedure Laterality Date   IR CATHETER TUBE CHANGE  10/31/2022   IR CATHETER TUBE CHANGE  11/02/2022   IR FLUORO GUIDED NEEDLE PLC ASPIRATION/INJECTION LOC  09/07/2022   TUMOR REMOVAL  1983   abdomen benign    Family History  Adopted: Yes  Problem Relation Age of Onset   Lupus Mother    Ataxia Neg Hx    Chorea Neg Hx    Dementia Neg Hx    Mental retardation Neg Hx    Migraines Neg Hx    Multiple sclerosis Neg Hx    Neurofibromatosis Neg Hx    Neuropathy Neg Hx    Parkinsonism Neg Hx    Seizures Neg Hx    Stroke Neg Hx     Social History   Socioeconomic History  Marital status: Single    Spouse name: Not on file   Number of children: Not on file   Years of education: Not on file   Highest education level: Not on file  Occupational History   Not on file  Tobacco Use   Smoking status: Every Day    Current packs/day: 1.00    Types: Cigarettes   Smokeless tobacco: Never  Vaping Use   Vaping status: Former  Substance and Sexual Activity   Alcohol use: Not Currently    Alcohol/week: 72.0 standard drinks of alcohol    Types: 10 Glasses of wine, 12 Cans of beer, 50 Shots of liquor per week   Drug use: Not Currently    Frequency: 7.0 times per week    Types: Marijuana    Comment: daily   Sexual activity: Yes    Partners: Female    Birth control/protection: None   Other Topics Concern   Not on file  Social History Narrative   Lives home alone.   Social Determinants of Health   Financial Resource Strain: Not on file  Food Insecurity: Patient Declined (10/30/2022)   Hunger Vital Sign    Worried About Running Out of Food in the Last Year: Patient declined    Ran Out of Food in the Last Year: Patient declined  Transportation Needs: No Transportation Needs (10/30/2022)   PRAPARE - Administrator, Civil Service (Medical): No    Lack of Transportation (Non-Medical): No  Physical Activity: Not on file  Stress: Not on file  Social Connections: Unknown (10/17/2021)   Received from [facility]   Social Network    Social Network: Not on file  Intimate Partner Violence: Not At Risk (10/30/2022)   Humiliation, Afraid, Rape, and Kick questionnaire    Fear of Current or Ex-Partner: No    Emotionally Abused: No    Physically Abused: No    Sexually Abused: No    Outpatient Medications Prior to Visit  Medication Sig Dispense Refill   acetaminophen (TYLENOL) 500 MG tablet Take 500 mg by mouth every 8 (eight) hours as needed for moderate pain.     natalizumab (TYSABRI) 300 MG/15ML injection Inject 15 mLs (300 mg total) into the vein every 28 (twenty-eight) days. 15 mL 5   feeding supplement (ENSURE ENLIVE / ENSURE PLUS) LIQD Take 237 mLs by mouth 3 (three) times daily between meals. (Patient not taking: Reported on 01/17/2023) 237 mL 12   lidocaine (HM LIDOCAINE PATCH) 4 % Place 1 patch onto the skin daily. (Patient not taking: Reported on 01/17/2023) 15 patch 0   metroNIDAZOLE (METROCREAM) 0.75 % cream Apply 1 Application topically 2 (two) times daily. (Patient not taking: Reported on 01/17/2023)     phenazopyridine (PYRIDIUM) 200 MG tablet Take 1 tablet (200 mg total) by mouth 3 (three) times daily. (Patient not taking: Reported on 01/17/2023) 6 tablet 0   Vitamin D, Ergocalciferol, (DRISDOL) 1.25 MG (50000 UNIT) CAPS capsule Take 1 capsule (50,000  Units total) by mouth every 7 (seven) days. (Patient not taking: Reported on 01/17/2023) 13 capsule 3   baclofen (LIORESAL) 20 MG tablet Take 1 tablet (20 mg total) by mouth 3 (three) times daily. (Patient not taking: Reported on 01/17/2023) 90 tablet 11   DULoxetine (CYMBALTA) 30 MG capsule Take 1 capsule (30 mg total) by mouth daily. (Patient not taking: Reported on 01/17/2023) 90 capsule 0   gabapentin (NEURONTIN) 400 MG capsule Take 1 capsule (400 mg total) by mouth 3 (three) times  daily. (Patient not taking: Reported on 01/17/2023) 90 capsule 0   melatonin 3 MG TABS tablet Take 3 mg by mouth at bedtime. (Patient not taking: Reported on 01/17/2023)     oxybutynin (DITROPAN-XL) 10 MG 24 hr tablet Take 10 mg by mouth daily. (Patient not taking: Reported on 01/17/2023)     QUEtiapine (SEROQUEL) 200 MG tablet Take 1 tablet (200 mg total) by mouth at bedtime. Take a half tablet at night for 4 days then increase to a whole tablet each night. (Patient not taking: Reported on 01/17/2023) 90 tablet 2   No facility-administered medications prior to visit.    Allergies  Allergen Reactions   Lithium Rash    ROS Review of Systems  Constitutional:  Negative for chills, diaphoresis, fatigue, fever and unexpected weight change.  HENT:  Negative for congestion, dental problem, drooling and ear discharge.   Eyes:  Negative for pain, discharge, redness and itching.  Respiratory:  Negative for apnea, cough, choking, chest tightness, shortness of breath and wheezing.   Cardiovascular: Negative.  Negative for chest pain, palpitations and leg swelling.  Gastrointestinal:  Negative for abdominal distention, abdominal pain, anal bleeding, blood in stool and vomiting.  Genitourinary:  Negative for difficulty urinating, flank pain, frequency and genital sores.       Incontinent of bladder and bowel.   Musculoskeletal: Negative.  Negative for arthralgias, back pain, gait problem and joint swelling.  Skin:  Negative for  color change, pallor and rash.  Neurological:  Negative for dizziness, facial asymmetry, light-headedness, numbness and headaches.  Psychiatric/Behavioral:  Negative for agitation, behavioral problems, confusion, hallucinations, self-injury, sleep disturbance and suicidal ideas.       Objective:    Physical Exam Vitals and nursing note reviewed.  Constitutional:      General: He is not in acute distress.    Appearance: Normal appearance. He is not ill-appearing, toxic-appearing or diaphoretic.  HENT:     Mouth/Throat:     Mouth: Mucous membranes are moist.     Pharynx: Oropharynx is clear. No oropharyngeal exudate or posterior oropharyngeal erythema.  Eyes:     General: No scleral icterus.       Right eye: No discharge.        Left eye: No discharge.     Extraocular Movements: Extraocular movements intact.     Conjunctiva/sclera: Conjunctivae normal.  Cardiovascular:     Rate and Rhythm: Normal rate and regular rhythm.     Pulses: Normal pulses.     Heart sounds: Normal heart sounds. No murmur heard.    No friction rub. No gallop.  Pulmonary:     Effort: Pulmonary effort is normal. No respiratory distress.     Breath sounds: Normal breath sounds. No stridor. No wheezing, rhonchi or rales.  Chest:     Chest wall: No tenderness.  Abdominal:     General: There is no distension.     Palpations: Abdomen is soft.     Tenderness: There is no abdominal tenderness. There is no right CVA tenderness, left CVA tenderness or guarding.     Comments: Suprapubic catheter in place connected to a drainage bag   Musculoskeletal:        General: No swelling, tenderness or signs of injury.     Right lower leg: No edema.     Left lower leg: No edema.     Comments: Patient sitting comfortably in a wheelchair  Skin:    General: Skin is warm and dry.  Capillary Refill: Capillary refill takes less than 2 seconds.     Coloration: Skin is not jaundiced or pale.     Findings: No bruising,  erythema or lesion.  Neurological:     Mental Status: He is alert and oriented to person, place, and time.  Psychiatric:        Mood and Affect: Mood normal.        Behavior: Behavior normal.        Thought Content: Thought content normal.        Judgment: Judgment normal.     BP (!) 97/57   Pulse 67   Temp (!) 97 F (36.1 C)   SpO2 98%  Wt Readings from Last 3 Encounters:  01/09/23 172 lb (78 kg)  01/03/23 180 lb (81.6 kg)  11/29/22 147 lb (66.7 kg)    Lab Results  Component Value Date   TSH 1.197 07/30/2022   Lab Results  Component Value Date   WBC 8.7 01/09/2023   HGB 12.5 (L) 01/09/2023   HCT 37.8 01/09/2023   MCV 93 01/09/2023   PLT 239 01/09/2023   Lab Results  Component Value Date   NA 139 11/15/2022   K 4.2 11/15/2022   CO2 26 11/15/2022   GLUCOSE 90 11/15/2022   BUN 11 11/15/2022   CREATININE 0.78 11/15/2022   BILITOT 0.7 11/15/2022   ALKPHOS 63 11/15/2022   AST 14 (L) 11/15/2022   ALT 19 11/15/2022   PROT 6.6 11/15/2022   ALBUMIN 3.0 (L) 11/15/2022   CALCIUM 9.6 11/15/2022   ANIONGAP 14 11/15/2022   EGFR 75 12/12/2021   Lab Results  Component Value Date   CHOL 173 04/13/2021   Lab Results  Component Value Date   HDL 37 (L) 04/13/2021   Lab Results  Component Value Date   LDLCALC 114 (H) 04/13/2021   Lab Results  Component Value Date   TRIG 119 04/13/2021   Lab Results  Component Value Date   CHOLHDL 4.7 04/13/2021   No results found for: "HGBA1C"    Assessment & Plan:   Problem List Items Addressed This Visit       Nervous and Auditory   MS (multiple sclerosis) (HCC) (Chronic)    He is established with neurology continue natalizumab 300 mg injection Was encouraged to maintain close follow-up with neurology        Other   Chronic pain syndrome (Chronic)    Continue duloxetine 30 mg daily, gabapentin 400 mg 3 times daily, baclofen 20 mg 3 times daily.  Does not want refill of lidocaine      Relevant Medications    gabapentin (NEURONTIN) 400 MG capsule   DULoxetine (CYMBALTA) 30 MG capsule   baclofen (LIORESAL) 20 MG tablet   Bipolar disorder (HCC)    Seroquel 200 mg daily at bedtime refilled Patient referred to psychiatrist      Relevant Medications   DULoxetine (CYMBALTA) 30 MG capsule   QUEtiapine (SEROQUEL) 200 MG tablet   Other Relevant Orders   Ambulatory referral to Psychiatry   Spastic gait - Primary    Patient is wheelchair dependent Continue baclofen 20 mg 3 times daily and gabapentin 400 mg 3 times daily      Other insomnia    Melatonin 3 mg at bedtime as needed refilled      Relevant Medications   melatonin 3 MG TABS tablet   Generalized anxiety disorder    Continue Cymbalta 30 mg daily Patient referred to psychiatrist  Relevant Medications   DULoxetine (CYMBALTA) 30 MG capsule   Vitamin D deficiency    Last vitamin D Lab Results  Component Value Date   VD25OH 20.6 (L) 06/27/2022  Checking vitamin D levels today      Relevant Orders   VITAMIN D 25 Hydroxy (Vit-D Deficiency, Fractures)   Marijuana user    Smokes marijuana daily.  Encouraged cessation      Tobacco use disorder, continuous    Smokes cigarettes daily did not see how much he smokes Need to avoid smoking cigarettes due to risk of lung cancer, COPD, heart disease discussed with the patient Encouraged to quit      Impaired mobility and ADLs    Has an appointment with home health agency today Needs 24-hour care I encouraged his dad to continue to assist with the patient at home in the meantime.      Other Visit Diagnoses     Neuralgia       Relevant Medications   gabapentin (NEURONTIN) 400 MG capsule   Overactive bladder       Relevant Medications   oxybutynin (DITROPAN-XL) 10 MG 24 hr tablet   Other Relevant Orders   Ambulatory referral to Urology       Meds ordered this encounter  Medications   DISCONTD: baclofen (LIORESAL) 20 MG tablet    Sig: Take 1 tablet (20 mg total) by  mouth 3 (three) times daily.    Dispense:  90 tablet    Refill:  4   gabapentin (NEURONTIN) 400 MG capsule    Sig: Take 1 capsule (400 mg total) by mouth 3 (three) times daily.    Dispense:  90 capsule    Refill:  4   DULoxetine (CYMBALTA) 30 MG capsule    Sig: Take 1 capsule (30 mg total) by mouth daily.    Dispense:  90 capsule    Refill:  1   baclofen (LIORESAL) 20 MG tablet    Sig: Take 1 tablet (20 mg total) by mouth 3 (three) times daily.    Dispense:  90 tablet    Refill:  4   melatonin 3 MG TABS tablet    Sig: Take 1 tablet (3 mg total) by mouth at bedtime as needed.    Dispense:  60 tablet    Refill:  1   oxybutynin (DITROPAN-XL) 10 MG 24 hr tablet    Sig: Take 1 tablet (10 mg total) by mouth daily.    Dispense:  90 tablet    Refill:  1   QUEtiapine (SEROQUEL) 200 MG tablet    Sig: Take 1 tablet (200 mg total) by mouth at bedtime.    Dispense:  90 tablet    Refill:  1    Follow-up: Return in about 3 months (around 04/19/2023) for DEPRESSION.    Donell Beers, FNP

## 2023-01-17 NOTE — Assessment & Plan Note (Signed)
Has an appointment with home health agency today Needs 24-hour care I encouraged his dad to continue to assist with the patient at home in the meantime.

## 2023-01-17 NOTE — Assessment & Plan Note (Signed)
Seroquel 200 mg daily at bedtime refilled Patient referred to psychiatrist

## 2023-01-17 NOTE — Assessment & Plan Note (Signed)
Last vitamin D Lab Results  Component Value Date   VD25OH 20.6 (L) 06/27/2022  Checking vitamin D levels today

## 2023-01-17 NOTE — Patient Instructions (Signed)
  1. Chronic pain syndrome  - gabapentin (NEURONTIN) 400 MG capsule; Take 1 capsule (400 mg total) by mouth 3 (three) times daily.  Dispense: 90 capsule; Refill: 4 - baclofen (LIORESAL) 20 MG tablet; Take 1 tablet (20 mg total) by mouth 3 (three) times daily.  Dispense: 90 tablet; Refill: 4   2 Neuralgia  - gabapentin (NEURONTIN) 400 MG capsule; Take 1 capsule (400 mg total) by mouth 3 (three) times daily.  Dispense: 90 capsule; Refill: 4  3. Bipolar affective disorder, remission status unspecified (HCC)  - DULoxetine (CYMBALTA) 30 MG capsule; Take 1 capsule (30 mg total) by mouth daily.  Dispense: 90 capsule; Refill: 1 - QUEtiapine (SEROQUEL) 200 MG tablet; Take 1 tablet (200 mg total) by mouth at bedtime.  Dispense: 90 tablet; Refill: 1 - Ambulatory referral to Psychiatry  4. Other insomnia  - melatonin 3 MG TABS tablet; Take 1 tablet (3 mg total) by mouth at bedtime as needed.  Dispense: 60 tablet; Refill: 1  5. Overactive bladder  - oxybutynin (DITROPAN-XL) 10 MG 24 hr tablet; Take 1 tablet (10 mg total) by mouth daily.  Dispense: 90 tablet; Refill: 1 - Ambulatory referral to Urology                 It is important that you exercise regularly at least 30 minutes 5 times a week as tolerated  Think about what you will eat, plan ahead. Choose " clean, green, fresh or frozen" over canned, processed or packaged foods which are more sugary, salty and fatty. 70 to 75% of food eaten should be vegetables and fruit. Three meals at set times with snacks allowed between meals, but they must be fruit or vegetables. Aim to eat over a 12 hour period , example 7 am to 7 pm, and STOP after  your last meal of the day. Drink water,generally about 64 ounces per day, no other drink is as healthy. Fruit juice is best enjoyed in a healthy way, by EATING the fruit.  Thanks for choosing [facility] we consider it a privelige to serve you.

## 2023-01-17 NOTE — Assessment & Plan Note (Signed)
He is established with neurology continue natalizumab 300 mg injection Was encouraged to maintain close follow-up with neurology

## 2023-01-17 NOTE — Assessment & Plan Note (Signed)
Patient is wheelchair dependent Continue baclofen 20 mg 3 times daily and gabapentin 400 mg 3 times daily

## 2023-01-18 DIAGNOSIS — G35 Multiple sclerosis: Secondary | ICD-10-CM | POA: Diagnosis not present

## 2023-01-18 NOTE — Telephone Encounter (Signed)
Please advise Kh 

## 2023-01-19 ENCOUNTER — Other Ambulatory Visit: Payer: Self-pay | Admitting: Nurse Practitioner

## 2023-01-19 DIAGNOSIS — G894 Chronic pain syndrome: Secondary | ICD-10-CM

## 2023-01-19 DIAGNOSIS — G35 Multiple sclerosis: Secondary | ICD-10-CM | POA: Diagnosis not present

## 2023-01-19 DIAGNOSIS — M792 Neuralgia and neuritis, unspecified: Secondary | ICD-10-CM

## 2023-01-19 MED ORDER — GABAPENTIN 400 MG PO CAPS
400.0000 mg | ORAL_CAPSULE | Freq: Three times a day (TID) | ORAL | 1 refills | Status: DC
Start: 2023-01-19 — End: 2024-01-30

## 2023-01-22 ENCOUNTER — Other Ambulatory Visit: Payer: Self-pay

## 2023-01-22 ENCOUNTER — Emergency Department (HOSPITAL_COMMUNITY)
Admission: EM | Admit: 2023-01-22 | Discharge: 2023-01-22 | Disposition: A | Discharge status: Home / Self Care | Payer: 59

## 2023-01-22 ENCOUNTER — Encounter (HOSPITAL_COMMUNITY): Payer: Self-pay

## 2023-01-22 DIAGNOSIS — Y732 Prosthetic and other implants, materials and accessory gastroenterology and urology devices associated with adverse incidents: Secondary | ICD-10-CM | POA: Insufficient documentation

## 2023-01-22 DIAGNOSIS — T83031A Leakage of indwelling urethral catheter, initial encounter: Secondary | ICD-10-CM | POA: Diagnosis not present

## 2023-01-22 DIAGNOSIS — T839XXA Unspecified complication of genitourinary prosthetic device, implant and graft, initial encounter: Secondary | ICD-10-CM

## 2023-01-22 LAB — BASIC METABOLIC PANEL
Anion gap: 7 (ref 5–15)
BUN: 10 mg/dL (ref 6–20)
CO2: 26 mmol/L (ref 22–32)
Calcium: 9.3 mg/dL (ref 8.9–10.3)
Chloride: 105 mmol/L (ref 98–111)
Creatinine, Ser: 0.98 mg/dL (ref 0.61–1.24)
GFR, Estimated: >60 mL/min (ref >60)
Glucose, Bld: 93 mg/dL (ref 70–99)
Potassium: 3.9 mmol/L (ref 3.5–5.1)
Sodium: 138 mmol/L (ref 135–145)

## 2023-01-22 LAB — CBC
HCT: 41.2 % (ref 39.0–52.0)
Hemoglobin: 13.6 g/dL (ref 13.0–17.0)
MCH: 31.3 pg (ref 26.0–34.0)
MCHC: 33 g/dL (ref 30.0–36.0)
MCV: 94.9 fL (ref 80.0–100.0)
Platelets: 204 10*3/uL (ref 150–400)
RBC: 4.34 MIL/uL (ref 4.22–5.81)
RDW: 15.3 % (ref 11.5–15.5)
WBC: 6.9 10*3/uL (ref 4.0–10.5)
nRBC: 0.9 % — ABNORMAL HIGH (ref 0.0–0.2)

## 2023-01-22 NOTE — Telephone Encounter (Signed)
JCV Lab came back: Indeterminate  Final Result : Negative

## 2023-01-22 NOTE — ED Triage Notes (Signed)
Pt checking in today complaining that his catheter is not working correctly. States that it is not draining, and urine is leaking out around catheter site.

## 2023-01-22 NOTE — Telephone Encounter (Signed)
JCV indererminate Index value:0.24  Inhibition assay range: negative

## 2023-01-22 NOTE — ED Provider Notes (Signed)
[place] EMERGENCY DEPARTMENT AT [facility] Provider Note   CSN: [id number] Arrival date & time: 01/22/23  1513     History  Chief Complaint  Patient presents with   Catheter issue.    Bradley Brandt is a 42 y.o. male.  42 year old male presenting emergency department with a chief complaint of Foley catheter issue.  Has a chronic Foley from his MS/neurogenic bladder.  He reports for the past week he has had some leaking around catheter and does not seem to be draining as he is urinating out of his penis.  No fevers, chills, abdominal pain, nausea vomiting.  He thinks it was roughly a month ago that was last exchanged.        Home Medications Prior to Admission medications   Medication Sig Start Date End Date Taking? Authorizing Provider  acetaminophen (TYLENOL) 500 MG tablet Take 500 mg by mouth every 8 (eight) hours as needed for moderate pain.    [provider]  baclofen (LIORESAL) 20 MG tablet Take 1 tablet (20 mg total) by mouth 3 (three) times daily. 01/17/23   Donell Beers, FNP  DULoxetine (CYMBALTA) 30 MG capsule Take 1 capsule (30 mg total) by mouth daily. 01/17/23   Donell Beers, FNP  feeding supplement (ENSURE ENLIVE / ENSURE PLUS) LIQD Take 237 mLs by mouth 3 (three) times daily between meals. Patient not taking: Reported on 01/17/2023 08/10/22   Karoline Caldwell, MD  gabapentin (NEURONTIN) 400 MG capsule Take 1 capsule (400 mg total) by mouth 3 (three) times daily. 01/19/23   Paseda, Gene Kay, FNP  lidocaine (HM LIDOCAINE PATCH) 4 % Place 1 patch onto the skin daily. Patient not taking: Reported on 01/17/2023 10/12/21   Orion Crook I, NP  melatonin 3 MG TABS tablet Take 1 tablet (3 mg total) by mouth at bedtime as needed. 01/17/23   Paseda, Rando Kay, FNP  metroNIDAZOLE (METROCREAM) 0.75 % cream Apply 1 Application topically 2 (two) times daily. Patient not taking: Reported on 01/17/2023    [provider]  natalizumab  (TYSABRI) 300 MG/15ML injection Inject 15 mLs (300 mg total) into the vein every 28 (twenty-eight) days. 09/05/16   Sater, Pearletha Furl, MD  oxybutynin (DITROPAN-XL) 10 MG 24 hr tablet Take 1 tablet (10 mg total) by mouth daily. 01/17/23   Donell Beers, FNP  phenazopyridine (PYRIDIUM) 200 MG tablet Take 1 tablet (200 mg total) by mouth 3 (three) times daily. Patient not taking: Reported on 01/17/2023 09/14/22   Mardene Sayer, MD  QUEtiapine (SEROQUEL) 200 MG tablet Take 1 tablet (200 mg total) by mouth at bedtime. 01/17/23   Donell Beers, FNP      Allergies    Lithium    Review of Systems   Review of Systems  Physical Exam Updated Vital Signs BP 122/68   Pulse 64   Temp 97.9 F (36.6 C)   Resp 18   Ht 5\' 11"  (1.803 m)   Wt 86.2 kg   SpO2 99%   BMI 26.50 kg/m  Physical Exam HENT:     Head: Normocephalic.     Nose: Nose normal.     Mouth/Throat:     Mouth: Mucous membranes are moist.  Eyes:     Conjunctiva/sclera: Conjunctivae normal.  Cardiovascular:     Rate and Rhythm: Normal rate and regular rhythm.  Pulmonary:     Effort: Pulmonary effort is normal.  Abdominal:     General: Abdomen is flat. There is  no distension.     Tenderness: There is no abdominal tenderness. There is no guarding or rebound.     Comments: Suprapubic catheter in place.  Skin:    General: Skin is warm and dry.  Neurological:     General: No focal deficit present.     Mental Status: He is alert.  Psychiatric:        Mood and Affect: Mood normal.        Behavior: Behavior normal.     ED Results / Procedures / Treatments   Labs (all labs ordered are listed, but only abnormal results are displayed) Labs Reviewed  CBC - Abnormal; Notable for the following components:      Result Value   nRBC 0.9 (*)    All other components within normal limits  BASIC METABOLIC PANEL    EKG None  Radiology No results found.  Procedures Procedures    Medications Ordered in  ED Medications - No data to display  ED Course/ Medical Decision Making/ A&P Clinical Course as of 01/22/23 1747  Mon Jan 22, 2023  1536 Per chart review: "42 y.o. African-American male with medical history significant for bipolar 1 disorder, relapsing-remitting multiple sclerosis causing right-sided weakness as well as requiring suprapubic catheter " [TY]  1707 Case discussed with urology; They reviewed notes and feel that patient should have catheter exchanged. Requesting ED attempt and call if we have issues.  [TY]    Clinical Course User Index [TY] Coral Spikes, DO                                 Medical Decision Making This is a well-appearing 42 year old male present emergency department with suprapubic catheter issues.  He is afebrile nontachycardic normotensive.  Physical exam reassuring with soft benign abdomen.  Suprapubic catheter is in place, however minimal urine in bag.  Bladder scanned with 89.  Per chart review patient has had similar presentations in the past and had to have catheter exchange.  Discussed case with urology; see ED course.  Foley catheter was exchanged here in the emergency department.  Basic labs with no leukocytosis to suggest stomach infection.  No metabolic derangements on his BMP.  Normal kidney function.  Catheter exchange.  Patient stable for discharge.  Follow-up with his primary doctor and urologist.  Amount and/or Complexity of Data Reviewed Independent Historian:     Details: Friend reported that he has been soaking his diaper with urine. External Data Reviewed:     Details: It appears initial catheterization was placed  Labs: ordered.  Risk Decision regarding hospitalization.           Final Clinical Impression(s) / ED Diagnoses Final diagnoses:  None    Rx / DC Orders ED Discharge Orders     None         Coral Spikes, DO 01/22/23 1801

## 2023-01-22 NOTE — ED Notes (Signed)
Providers at bedside replacing suprapubic catheter

## 2023-01-22 NOTE — Discharge Instructions (Signed)
Please follow-up with your urologist soon as possible.  Return if you develop fevers, chills, have further Foley issues, abdominal pain or any new or worsening symptoms that are concerning to you.

## 2023-01-23 ENCOUNTER — Telehealth: Payer: Self-pay

## 2023-01-23 NOTE — Transitions of Care (Post Inpatient/ED Visit) (Signed)
   01/23/2023  Name: Bradley Brandt MRN: [id number] DOB: 1980-08-06  Today's TOC FU Call Status: Today's TOC FU Call Status:: Unsuccessful Call (1st Attempt) Unsuccessful Call (1st Attempt) Date: 01/23/23  Attempted to reach the patient regarding the most recent Inpatient/ED visit.  Follow Up Plan: Additional outreach attempts will be made to reach the patient to complete the Transitions of Care (Post Inpatient/ED visit) call.   Signature Renelda Loma RMA

## 2023-01-29 ENCOUNTER — Telehealth: Payer: Self-pay

## 2023-01-29 NOTE — Transitions of Care (Post Inpatient/ED Visit) (Cosign Needed)
   01/29/2023  Name: Bradley Brandt MRN: [id number] DOB: 1980/11/03  Today's TOC FU Call Status: Today's TOC FU Call Status:: Unsuccessful Call (3rd Attempt) Unsuccessful Call (3rd Attempt) Date: 01/29/23  Attempted to reach the patient regarding the most recent Inpatient/ED visit.  Follow Up Plan: No further outreach attempts will be made at this time. We have been unable to contact the patient.  Signature Renelda Loma RMA

## 2023-01-31 ENCOUNTER — Other Ambulatory Visit: Payer: Self-pay

## 2023-01-31 ENCOUNTER — Other Ambulatory Visit: Payer: Self-pay | Admitting: Nurse Practitioner

## 2023-01-31 ENCOUNTER — Telehealth: Payer: Self-pay | Admitting: Nurse Practitioner

## 2023-01-31 ENCOUNTER — Ambulatory Visit (INDEPENDENT_AMBULATORY_CARE_PROVIDER_SITE_OTHER): Payer: 59

## 2023-01-31 ENCOUNTER — Encounter (HOSPITAL_COMMUNITY): Payer: Self-pay

## 2023-01-31 ENCOUNTER — Emergency Department (HOSPITAL_COMMUNITY)
Admission: EM | Admit: 2023-01-31 | Discharge: 2023-01-31 | Disposition: A | Discharge status: Home / Self Care | Payer: 59 | Attending: Emergency Medicine | Admitting: Emergency Medicine

## 2023-01-31 VITALS — BP 114/54 | HR 61 | Temp 98.4°F | Resp 20 | Ht 71.0 in | Wt 171.8 lb

## 2023-01-31 DIAGNOSIS — Y732 Prosthetic and other implants, materials and accessory gastroenterology and urology devices associated with adverse incidents: Secondary | ICD-10-CM | POA: Diagnosis not present

## 2023-01-31 DIAGNOSIS — N3000 Acute cystitis without hematuria: Secondary | ICD-10-CM | POA: Diagnosis not present

## 2023-01-31 DIAGNOSIS — Z7409 Other reduced mobility: Secondary | ICD-10-CM

## 2023-01-31 DIAGNOSIS — G35 Multiple sclerosis: Secondary | ICD-10-CM

## 2023-01-31 DIAGNOSIS — T83091A Other mechanical complication of indwelling urethral catheter, initial encounter: Secondary | ICD-10-CM | POA: Diagnosis present

## 2023-01-31 DIAGNOSIS — R261 Paralytic gait: Secondary | ICD-10-CM

## 2023-01-31 DIAGNOSIS — Z9359 Other cystostomy status: Secondary | ICD-10-CM

## 2023-01-31 LAB — URINALYSIS, W/ REFLEX TO CULTURE (INFECTION SUSPECTED)
Bilirubin Urine: NEGATIVE
Glucose, UA: NEGATIVE mg/dL
Ketones, ur: NEGATIVE mg/dL
Nitrite: NEGATIVE
Protein, ur: 30 mg/dL — AB
RBC / HPF: >50 RBC/hpf (ref 0–5)
Specific Gravity, Urine: 1.01 (ref 1.005–1.030)
WBC, UA: >50 WBC/hpf (ref 0–5)
pH: 6 (ref 5.0–8.0)

## 2023-01-31 MED ORDER — SODIUM CHLORIDE 0.9 % IV SOLN
300.0000 mg | Freq: Once | INTRAVENOUS | Status: AC
  Administered 2023-01-31: 300 mg via INTRAVENOUS
  Filled 2023-01-31: qty 15

## 2023-01-31 MED ORDER — ACETAMINOPHEN 325 MG PO TABS
650.0000 mg | ORAL_TABLET | Freq: Once | ORAL | Status: AC
  Administered 2023-01-31: 650 mg via ORAL
  Filled 2023-01-31: qty 2

## 2023-01-31 MED ORDER — SODIUM CHLORIDE 0.9 % IV SOLN: INTRAVENOUS | Status: DC

## 2023-01-31 MED ORDER — CEPHALEXIN 500 MG PO CAPS
500.0000 mg | ORAL_CAPSULE | Freq: Two times a day (BID) | ORAL | 0 refills | Status: DC
Start: 2023-01-31 — End: 2023-02-06

## 2023-01-31 MED ORDER — SULFAMETHOXAZOLE-TRIMETHOPRIM 800-160 MG PO TABS
1.0000 | ORAL_TABLET | Freq: Once | ORAL | Status: AC
  Administered 2023-01-31: 1 via ORAL
  Filled 2023-01-31: qty 1

## 2023-01-31 MED ORDER — LORATADINE 10 MG PO TABS
10.0000 mg | ORAL_TABLET | Freq: Once | ORAL | Status: AC
  Administered 2023-01-31: 10 mg via ORAL
  Filled 2023-01-31: qty 1

## 2023-01-31 MED ORDER — PHENAZOPYRIDINE HCL 200 MG PO TABS: 200.0000 mg | ORAL_TABLET | Freq: Three times a day (TID) | ORAL | 0 refills | Status: AC | PRN

## 2023-01-31 NOTE — ED Triage Notes (Signed)
Pt coming in asking to have the catheter removed. States that he is urinating around the catheter, and has been having UTI's. Pt has an appointment with urology on the 23.

## 2023-01-31 NOTE — Treatment Plan (Signed)
UROLOGY TREATMENT PLAN NOTE 42 year old male with history of MS, wheelchair-bound, and suprapubic tube in place.  Presents with leakage around the suprapubic tube and from his native urethra in addition to dysuria.  Patient was seen on 8/5 where suprapubic tube was exchanged at that time.    He is currently afebrile and hemodynamically stable.  At this time, recommend following up urine culture.  UA with large leukocyte esterase, greater than 50 white blood cells and rare bacteria, concerning for potential urinary tract infection.  Patient likely having UTI-like symptoms which is contributing to his leakage.  He may also be experiencing bladder spasms.  He is currently on Ditropan 10 mg XL.  Plan: -Recommend treating patient for UTI with Keflex. -Recommend making sure bladder is draining by assessing with a PVR bladder scan. -Patient has follow-up with urology on 8/23 -At last visit in May, there was post to be a discussion of cystoscopy with bladder Botox to help with overactivity. -catheter should be exchanged at 8/23 appointment.

## 2023-01-31 NOTE — Discharge Instructions (Addendum)
As discussed, your urine shows that you have an infection.  The urine is being sent for culture will take a couple days to result.  If the bacteria is not at the point of the antibiotic, you will receive a call in a new antibiotic will be sent to the pharmacy.  For now, I want you to take Keflex 500 mg twice a day for the next 7 days.  Please complete the entirety of the medication to ensure resolution of infection.  I have also sent a prescription of Pyridium to the pharmacy for you to take 3 times a day as needed for pain and burning with urination.  Make sure you are taking oxybutynin every day.  This will help with bladder spasms.  You have an appointment scheduled for 8/23 with urology.  Please keep this appointment so they can reevaluate your symptoms and look at your catheter.  Get help right away if: You have very bad back pain. You have very bad pain in your lower belly. You have a fever. You have chills. You feeling like you will vomit or you vomit.

## 2023-01-31 NOTE — Progress Notes (Signed)
Diagnosis: Multiple Sclerosis  Provider:  Chilton Greathouse MD  Procedure: IV Infusion  IV Type: Peripheral, IV Location: R Forearm  Tysabri (Natalizumab), Dose: 300 mg  Infusion Start Time: 1051  Infusion Stop Time: 1153  Post Infusion IV Care: Observation period completed and Peripheral IV Discontinued  Discharge: Condition: Good, Destination: Home . AVS Provided  Performed by:  Garnette Czech, RN

## 2023-01-31 NOTE — ED Provider Notes (Addendum)
[place] EMERGENCY DEPARTMENT AT [facility] Provider Note   CSN: [id number] Arrival date & time: 01/31/23  1438     History  Chief Complaint  Patient presents with   Catheter issue     Bradley Brandt is a 42 y.o. male with a history of multiple sclerosis, bipolar disorder and urinary retention who presents to the ED today with complaints about his catheter.  Patient had his suprapubic catheter placed on 09/07/2022 due to urinary retention.  Today in the ED, patient is requesting his catheter to be removed.  He reports that he has pain at the catheter site and states that when he urinates the urine comes out of both his penis as well as the catheter site.  Patient reports pain and burning with urination through his penis.  He denies any discharge or redness at the catheter site.  No fevers, back pain, or gross hematuria.     Home Medications Prior to Admission medications   Medication Sig Start Date End Date Taking? Authorizing Provider  cephALEXin (KEFLEX) 500 MG capsule Take 1 capsule (500 mg total) by mouth 2 (two) times daily for 7 days. 01/31/23 02/07/23 Yes Maxwell Marion, PA-C  phenazopyridine (PYRIDIUM) 200 MG tablet Take 1 tablet (200 mg total) by mouth every 8 (eight) hours as needed for up to 7 days for pain. 01/31/23 02/07/23 Yes Maxwell Marion, PA-C  acetaminophen (TYLENOL) 500 MG tablet Take 500 mg by mouth every 8 (eight) hours as needed for moderate pain.    [provider]  baclofen (LIORESAL) 20 MG tablet Take 1 tablet (20 mg total) by mouth 3 (three) times daily. 01/17/23   Donell Beers, FNP  DULoxetine (CYMBALTA) 30 MG capsule Take 1 capsule (30 mg total) by mouth daily. 01/17/23   Donell Beers, FNP  feeding supplement (ENSURE ENLIVE / ENSURE PLUS) LIQD Take 237 mLs by mouth 3 (three) times daily between meals. Patient not taking: Reported on 01/17/2023 08/10/22   Karoline Caldwell, MD  gabapentin (NEURONTIN) 400 MG capsule Take 1 capsule (400 mg  total) by mouth 3 (three) times daily. 01/19/23   Paseda, Bohorquez Kay, FNP  lidocaine (HM LIDOCAINE PATCH) 4 % Place 1 patch onto the skin daily. Patient not taking: Reported on 01/17/2023 10/12/21   Orion Crook I, NP  melatonin 3 MG TABS tablet Take 1 tablet (3 mg total) by mouth at bedtime as needed. 01/17/23   Paseda, Sloss Kay, FNP  metroNIDAZOLE (METROCREAM) 0.75 % cream Apply 1 Application topically 2 (two) times daily. Patient not taking: Reported on 01/17/2023    [provider]  natalizumab (TYSABRI) 300 MG/15ML injection Inject 15 mLs (300 mg total) into the vein every 28 (twenty-eight) days. 09/05/16   Sater, Pearletha Furl, MD  oxybutynin (DITROPAN-XL) 10 MG 24 hr tablet Take 1 tablet (10 mg total) by mouth daily. 01/17/23   Paseda, Silsby Kay, FNP  QUEtiapine (SEROQUEL) 200 MG tablet Take 1 tablet (200 mg total) by mouth at bedtime. 01/17/23   Donell Beers, FNP      Allergies    Lithium    Review of Systems   Review of Systems  Genitourinary:  Positive for dysuria.  All other systems reviewed and are negative.   Physical Exam Updated Vital Signs BP (!) 141/115 (BP Location: Right Arm)   Pulse 88   Temp (!) 97.5 F (36.4 C) (Oral)   Resp 20   Ht 5\' 11"  (1.803 m)   SpO2 97%  BMI 23.96 kg/m  Physical Exam Vitals and nursing note reviewed.  Constitutional:      General: He is not in acute distress.    Appearance: Normal appearance.  HENT:     Head: Normocephalic and atraumatic.     Mouth/Throat:     Mouth: Mucous membranes are moist.  Eyes:     Conjunctiva/sclera: Conjunctivae normal.     Pupils: Pupils are equal, round, and reactive to light.  Cardiovascular:     Rate and Rhythm: Normal rate and regular rhythm.     Pulses: Normal pulses.     Heart sounds: Normal heart sounds.  Pulmonary:     Effort: Pulmonary effort is normal.     Breath sounds: Normal breath sounds.  Abdominal:     Palpations: Abdomen is soft.     Tenderness: There is no  abdominal tenderness.  Skin:    General: Skin is warm and dry.     Findings: No rash.  Neurological:     General: No focal deficit present.     Mental Status: He is alert.  Psychiatric:        Mood and Affect: Mood normal.        Behavior: Behavior normal.     ED Results / Procedures / Treatments   Labs (all labs ordered are listed, but only abnormal results are displayed) Labs Reviewed  URINALYSIS, W/ REFLEX TO CULTURE (INFECTION SUSPECTED) - Abnormal; Notable for the following components:      Result Value   APPearance HAZY (*)    Hgb urine dipstick MODERATE (*)    Protein, ur 30 (*)    Leukocytes,Ua LARGE (*)    Bacteria, UA RARE (*)    All other components within normal limits  URINE CULTURE    EKG None  Radiology No results found.  Procedures Procedures: not indicated.   Medications Ordered in ED Medications  sulfamethoxazole-trimethoprim (BACTRIM DS) 800-160 MG per tablet 1 tablet (1 tablet Oral Given 01/31/23 2001)    ED Course/ Medical Decision Making/ A&P                                 Medical Decision Making Amount and/or Complexity of Data Reviewed Labs: ordered.  Risk Prescription drug management.   This patient presents to the ED for concern of catheter concerns, this involves an extensive number of treatment options, and is a complaint that carries with it a high risk of complications and morbidity.   Differential diagnosis includes: catheter site infection, clogged tubing, UTI, pyelonephritis, etc.   Comorbidities  See HPI above   Additional History  Additional history obtained from patient's previous ED records and urology notes.   Lab Tests  I ordered and personally interpreted labs.  The pertinent results include:   UA shows rare bacteria with leukocyte esterase present. Culture pending.   Imaging Studies  I ordered imaging studies including bladder scan which showed: 20 mL urine I agree with the radiologist  interpretation   Consultations  I requested consultation with urology,  and discussed lab and imaging findings as well as pertinent plan - they recommend: Do not exchange suprapubic catheter since it was just done 9 days ago.  Patient needs to remain on oxybutynin daily and to be started on Keflex for UTI.   Problem List / ED Course / Critical Interventions / Medication Management  Pain at catheter site,   I ordered medications including: Bactrim  for UTI - as this was initially discussed with urology  Informed patient and family member at bedside what urologist had said.  Suprapubic catheter will not be changed today, as it was at the last ED visit on 8/5.  Patient has an appointment 8/23 to follow-up with urology. I have reviewed the patients home medicines and have made adjustments as needed   Social Determinants of Health  Access to healthcare   Test / Admission - Considered  Discussed results with patient.  All questions were answered. He is hemodynamically stable and safe for discharge home. Follow-up with urology on 8/23. Prescriptions for Keflex and Pyridium to the pharmacy. Return precautions provided.       Final Clinical Impression(s) / ED Diagnoses Final diagnoses:  Acute cystitis without hematuria    Rx / DC Orders ED Discharge Orders          Ordered    cephALEXin (KEFLEX) 500 MG capsule  2 times daily        01/31/23 2023    phenazopyridine (PYRIDIUM) 200 MG tablet  Every 8 hours PRN        01/31/23 2023              Maxwell Marion, PA-C 01/31/23 2311    Maxwell Marion, PA-C 01/31/23 2312    Lorre Nick, MD 02/05/23 1442

## 2023-01-31 NOTE — Telephone Encounter (Signed)
Pt's father. LVM on nurse line 01/30/23 at 11:25am returning Kimberly's call- father states that he needs an order from provider for the home health nurse to work with his catheter that he came home from the hospital with.  Please call Mr. Sepanski at [phone]

## 2023-02-03 LAB — URINE CULTURE: Culture: >=100000 — AB

## 2023-02-04 ENCOUNTER — Telehealth (HOSPITAL_BASED_OUTPATIENT_CLINIC_OR_DEPARTMENT_OTHER): Payer: Self-pay | Admitting: *Deleted

## 2023-02-04 NOTE — Telephone Encounter (Signed)
Post ED Visit - Positive Culture Follow-up: Successful Patient Follow-Up  Culture assessed and recommendations reviewed by:  []  Enzo Bi, Pharm.D. []  Celedonio Miyamoto, Pharm.D., BCPS AQ-ID []  Garvin Fila, Pharm.D., BCPS []  Georgina Pillion, [address].D., BCPS []  [place], [place].D., BCPS, AAHIVP []  Estella Husk, Pharm.D., BCPS, AAHIVP []  Lysle Pearl, PharmD, BCPS []  Phillips Climes, PharmD, BCPS []  Agapito Games, PharmD, BCPS [x]  Candie Chroman, PharmD  Positive urine culture  []  Patient discharged without antimicrobial prescription and treatment is now indicated [x]  Organism is resistant to prescribed ED discharge antimicrobial []  Patient with positive blood cultures  Changes discussed with ED provider: [name], MD New antibiotic prescription Levofloxacin 500mg  PO daily x 5 days Stop Cephalexin Called to [facility] [address] [place]  Contacted patient, date 02/04/23, time 1552   Patsey Berthold 02/04/2023, 3:52 PM

## 2023-02-04 NOTE — Progress Notes (Signed)
ED Antimicrobial Stewardship Positive Culture Follow Up   Bradley Brandt is an 42 y.o. male who presented to [facility] on 01/31/2023 with a chief complaint of  Chief Complaint  Patient presents with   Catheter issue     Recent Results (from the past 720 hour(s))  Urine Culture     Status: Abnormal   Collection Time: 01/31/23  6:46 PM   Specimen: Urine, Random  Result Value Ref Range Status   Specimen Description   Final    URINE, RANDOM Performed at [facility], [address] [place], [place] [address]    Special Requests   Final    NONE Reflexed from [id number] Performed at [facility], [address], [place], [place] [address]    Culture (A)  Final    >=100,000 COLONIES/mL GROUP B STREP(S.AGALACTIAE)ISOLATED >=100,000 COLONIES/mL ENTEROBACTER CLOACAE TESTING AGAINST S. AGALACTIAE NOT ROUTINELY PERFORMED DUE TO PREDICTABILITY OF AMP/PEN/VAN SUSCEPTIBILITY. Performed at [facility], [address], [place], [place] [address]    Report Status 02/03/2023 FINAL  Final   Organism ID, Bacteria ENTEROBACTER CLOACAE (A)  Final      Susceptibility   Enterobacter cloacae - MIC*    CEFEPIME <=0.12 SENSITIVE Sensitive     CIPROFLOXACIN <=0.25 SENSITIVE Sensitive     GENTAMICIN <=1 SENSITIVE Sensitive     IMIPENEM 0.5 SENSITIVE Sensitive     NITROFURANTOIN 32 SENSITIVE Sensitive     TRIMETH/SULFA <=20 SENSITIVE Sensitive     PIP/TAZO <=4 SENSITIVE Sensitive     * >=100,000 COLONIES/mL ENTEROBACTER CLOACAE    [x]  Treated with cephalexin, organism resistant to prescribed antimicrobial []  Patient discharged originally without antimicrobial agent and treatment is now indicated  New antibiotic prescription:  - Stop cephalexin  - levofloxacin 500 mg PO daily x 5 days   ED Provider: [name], MD     [name], PharmD, BCPS 02/04/2023 2:49 PM

## 2023-02-06 ENCOUNTER — Ambulatory Visit (INDEPENDENT_AMBULATORY_CARE_PROVIDER_SITE_OTHER): Payer: 59 | Admitting: Nurse Practitioner

## 2023-02-06 ENCOUNTER — Encounter: Payer: Self-pay | Admitting: Nurse Practitioner

## 2023-02-06 VITALS — BP 113/71 | HR 71 | Temp 98.4°F | Resp 16

## 2023-02-06 DIAGNOSIS — Z09 Encounter for follow-up examination after completed treatment for conditions other than malignant neoplasm: Secondary | ICD-10-CM | POA: Diagnosis not present

## 2023-02-06 DIAGNOSIS — Z7409 Other reduced mobility: Secondary | ICD-10-CM

## 2023-02-06 DIAGNOSIS — R3989 Other symptoms and signs involving the genitourinary system: Secondary | ICD-10-CM | POA: Diagnosis not present

## 2023-02-06 DIAGNOSIS — Z789 Other specified health status: Secondary | ICD-10-CM

## 2023-02-06 DIAGNOSIS — R32 Unspecified urinary incontinence: Secondary | ICD-10-CM | POA: Diagnosis not present

## 2023-02-06 LAB — POCT URINALYSIS DIPSTICK
Bilirubin, UA: NEGATIVE
Glucose, UA: NEGATIVE
Ketones, UA: NEGATIVE
Nitrite, UA: NEGATIVE
Protein, UA: POSITIVE — AB
Spec Grav, UA: 1.025 (ref 1.010–1.025)
Urobilinogen, UA: 0.2 E.U./dL
pH, UA: 7.5 (ref 5.0–8.0)

## 2023-02-06 MED ORDER — SULFAMETHOXAZOLE-TRIMETHOPRIM 800-160 MG PO TABS: 1.0000 | ORAL_TABLET | Freq: Two times a day (BID) | ORAL | 0 refills | Status: DC

## 2023-02-06 NOTE — Assessment & Plan Note (Addendum)
Needs assistance with activities of daily living Moves around mainly using a wheelchair due to his history of MS Referral for [facility] is pending

## 2023-02-06 NOTE — Progress Notes (Signed)
Established Patient Office Visit  Subjective:  Patient ID: Bradley Brandt, male    DOB: [birth year]  Age: 42 y.o. MRN: [id number]  CC:  Chief Complaint  Patient presents with   ER follow up    Patient is not liking the catheter. He states that the urine is still coming out of penis and into catheter. He states that t burns. Its off and on. He has appt with urologist Friday but they are trying to see him today. They are also asking for home health nurse. Has at least 1 day left on antibiotic.     HPI Bradley Brandt is a 42 y.o. male  has a past medical history of Bipolar 1 disorder (HCC), Bipolar disorder, unspecified (HCC) (01/03/2013), MS (multiple sclerosis) (HCC) (dx 2006), and Neuromuscular disorder (HCC).  Patient presents for follow-up for ER visit.  Patient was at the emergency room on 01/31/2023 with complaints of dysuria with urination through his penis.  He has a suprapubic catheter placed for urinary retention.  He stated that he has not been getting any urine in the bag but instead has been urinating through his penis.  Has urinary incontinence.  Patient reports some tenderness around the catheter incision sites.  No fever, chills redness discharge.  He has completed full course of antibiotics ordered at emergency department, states that he is still taking Pyridium.  They have appointment with urology today.  Patient needs home health nurse to assist with ADLs.  He is to have an home health nurse in the past prior to his admission to the hospital and and SNIF.  They were trying  to get a home health agency but it still but they required self-pay.  His sister and his male friend has been assisting presently.  Home meds referral placed on 814.   Patient is accompanied by his sister today.  Past Medical History:  Diagnosis Date   Bipolar 1 disorder (HCC)    Bipolar disorder, unspecified (HCC) 01/03/2013   MS (multiple sclerosis) (HCC) dx 2006   relapsing-remitting right sided  weakness   Neuromuscular disorder (HCC)    MS    Past Surgical History:  Procedure Laterality Date   IR CATHETER TUBE CHANGE  10/31/2022   IR CATHETER TUBE CHANGE  11/02/2022   IR FLUORO GUIDED NEEDLE PLC ASPIRATION/INJECTION LOC  09/07/2022   TUMOR REMOVAL  1983   abdomen benign    Family History  Adopted: Yes  Problem Relation Age of Onset   Lupus Mother    Ataxia Neg Hx    Chorea Neg Hx    Dementia Neg Hx    Mental retardation Neg Hx    Migraines Neg Hx    Multiple sclerosis Neg Hx    Neurofibromatosis Neg Hx    Neuropathy Neg Hx    Parkinsonism Neg Hx    Seizures Neg Hx    Stroke Neg Hx     Social History   Socioeconomic History   Marital status: Single    Spouse name: Not on file   Number of children: Not on file   Years of education: Not on file   Highest education level: Not on file  Occupational History   Not on file  Tobacco Use   Smoking status: Every Day    Current packs/day: 1.00    Types: Cigarettes   Smokeless tobacco: Never  Vaping Use   Vaping status: Former  Substance and Sexual Activity   Alcohol use: Not Currently  Alcohol/week: 72.0 standard drinks of alcohol    Types: 10 Glasses of wine, 12 Cans of beer, 50 Shots of liquor per week   Drug use: Not Currently    Frequency: 7.0 times per week    Types: Marijuana    Comment: daily   Sexual activity: Yes    Partners: Female    Birth control/protection: None  Other Topics Concern   Not on file  Social History Narrative   Lives home alone.   Social Determinants of Health   Financial Resource Strain: Not on file  Food Insecurity: Patient Declined (10/30/2022)   Hunger Vital Sign    Worried About Running Out of Food in the Last Year: Patient declined    Ran Out of Food in the Last Year: Patient declined  Transportation Needs: No Transportation Needs (10/30/2022)   PRAPARE - Administrator, Civil Service (Medical): No    Lack of Transportation (Non-Medical): No  Physical  Activity: Not on file  Stress: Not on file  Social Connections: Unknown (10/17/2021)   [facility]   Social Network    Social Network: Not on file  Intimate Partner Violence: Not At Risk (10/30/2022)   Humiliation, Afraid, Rape, and Kick questionnaire    Fear of Current or Ex-Partner: No    Emotionally Abused: No    Physically Abused: No    Sexually Abused: No    Outpatient Medications Prior to Visit  Medication Sig Dispense Refill   baclofen (LIORESAL) 20 MG tablet Take 1 tablet (20 mg total) by mouth 3 (three) times daily. 90 tablet 4   DULoxetine (CYMBALTA) 30 MG capsule Take 1 capsule (30 mg total) by mouth daily. 90 capsule 1   gabapentin (NEURONTIN) 400 MG capsule Take 1 capsule (400 mg total) by mouth 3 (three) times daily. 270 capsule 1   oxybutynin (DITROPAN-XL) 10 MG 24 hr tablet Take 1 tablet (10 mg total) by mouth daily. 90 tablet 1   QUEtiapine (SEROQUEL) 200 MG tablet Take 1 tablet (200 mg total) by mouth at bedtime. 90 tablet 1   acetaminophen (TYLENOL) 500 MG tablet Take 500 mg by mouth every 8 (eight) hours as needed for moderate pain.     feeding supplement (ENSURE ENLIVE / ENSURE PLUS) LIQD Take 237 mLs by mouth 3 (three) times daily between meals. (Patient not taking: Reported on 01/17/2023) 237 mL 12   lidocaine (HM LIDOCAINE PATCH) 4 % Place 1 patch onto the skin daily. (Patient not taking: Reported on 01/17/2023) 15 patch 0   melatonin 3 MG TABS tablet Take 1 tablet (3 mg total) by mouth at bedtime as needed. 60 tablet 1   metroNIDAZOLE (METROCREAM) 0.75 % cream Apply 1 Application topically 2 (two) times daily. (Patient not taking: Reported on 01/17/2023)     natalizumab (TYSABRI) 300 MG/15ML injection Inject 15 mLs (300 mg total) into the vein every 28 (twenty-eight) days. 15 mL 5   phenazopyridine (PYRIDIUM) 200 MG tablet Take 1 tablet (200 mg total) by mouth every 8 (eight) hours as needed for up to 7 days for pain. (Patient not taking: Reported on  02/06/2023) 21 tablet 0   cephALEXin (KEFLEX) 500 MG capsule Take 1 capsule (500 mg total) by mouth 2 (two) times daily for 7 days. (Patient not taking: Reported on 02/06/2023) 14 capsule 0   No facility-administered medications prior to visit.    Allergies  Allergen Reactions   Lithium Rash    ROS Review of Systems  Constitutional:  Negative for activity change, appetite change, chills, diaphoresis, fatigue, fever and unexpected weight change.  HENT:  Negative for congestion, dental problem, drooling and ear discharge.   Eyes:  Negative for pain, discharge, redness and itching.  Respiratory:  Negative for apnea, cough, choking, chest tightness, shortness of breath and wheezing.   Cardiovascular: Negative.  Negative for chest pain, palpitations and leg swelling.  Gastrointestinal:  Negative for abdominal distention, abdominal pain, anal bleeding, blood in stool, constipation, diarrhea and vomiting.  Endocrine: Negative for polydipsia, polyphagia and polyuria.  Genitourinary:  Positive for dysuria. Negative for difficulty urinating, flank pain, frequency, genital sores, penile discharge, penile pain, penile swelling and scrotal swelling.  Musculoskeletal:  Positive for gait problem. Negative for back pain and joint swelling.  Skin:  Negative for color change, pallor and rash.  Neurological:  Negative for dizziness, facial asymmetry, light-headedness, numbness and headaches.  Psychiatric/Behavioral:  Negative for agitation, behavioral problems, confusion, hallucinations, self-injury, sleep disturbance and suicidal ideas.       Objective:    Physical Exam Vitals and nursing note reviewed. Exam conducted with a chaperone present.  Constitutional:      General: He is not in acute distress.    Appearance: Normal appearance. He is not ill-appearing, toxic-appearing or diaphoretic.  HENT:     Mouth/Throat:     Mouth: Mucous membranes are moist.     Pharynx: Oropharynx is clear. No  oropharyngeal exudate or posterior oropharyngeal erythema.  Eyes:     General: No scleral icterus.       Right eye: No discharge.        Left eye: No discharge.     Extraocular Movements: Extraocular movements intact.     Conjunctiva/sclera: Conjunctivae normal.  Cardiovascular:     Rate and Rhythm: Normal rate and regular rhythm.     Pulses: Normal pulses.     Heart sounds: Normal heart sounds. No murmur heard.    No friction rub. No gallop.  Pulmonary:     Effort: Pulmonary effort is normal. No respiratory distress.     Breath sounds: Normal breath sounds. No stridor. No wheezing, rhonchi or rales.  Chest:     Chest wall: No tenderness.  Abdominal:     General: There is no distension.     Palpations: Abdomen is soft.     Tenderness: There is no abdominal tenderness. There is no right CVA tenderness, left CVA tenderness or guarding.  Genitourinary:    Comments: Suprapubic catheter in place.  No redness,  or discharge or rashes noted around the penis.  Suprapubic catheter insertion site clean dry.  No redness or swelling noted.  Patient reported tenderness on palpation of the catheter insertion  sites Musculoskeletal:        General: No signs of injury.     Right lower leg: No edema.     Left lower leg: No edema.  Skin:    General: Skin is warm and dry.     Capillary Refill: Capillary refill takes less than 2 seconds.     Coloration: Skin is not jaundiced or pale.     Findings: No bruising or erythema.  Neurological:     Mental Status: He is alert and oriented to person, place, and time. Mental status is at baseline.     Motor: Weakness present.     Coordination: Coordination normal.     Gait: Gait abnormal.  Psychiatric:        Mood and Affect: Mood normal.  Behavior: Behavior normal.        Thought Content: Thought content normal.        Judgment: Judgment normal.     BP 113/71   Pulse 71   Temp 98.4 F (36.9 C) (Oral)   Resp 16   SpO2 96%  Wt Readings from  Last 3 Encounters:  01/31/23 171 lb 12.8 oz (77.9 kg)  01/22/23 190 lb (86.2 kg)  01/09/23 172 lb (78 kg)    Lab Results  Component Value Date   TSH 1.197 07/30/2022   Lab Results  Component Value Date   WBC 6.9 01/22/2023   HGB 13.6 01/22/2023   HCT 41.2 01/22/2023   MCV 94.9 01/22/2023   PLT 204 01/22/2023   Lab Results  Component Value Date   NA 138 01/22/2023   K 3.9 01/22/2023   CO2 26 01/22/2023   GLUCOSE 93 01/22/2023   BUN 10 01/22/2023   CREATININE 0.98 01/22/2023   BILITOT 0.7 11/15/2022   ALKPHOS 63 11/15/2022   AST 14 (L) 11/15/2022   ALT 19 11/15/2022   PROT 6.6 11/15/2022   ALBUMIN 3.0 (L) 11/15/2022   CALCIUM 9.3 01/22/2023   ANIONGAP 7 01/22/2023   EGFR 75 12/12/2021   Lab Results  Component Value Date   CHOL 173 04/13/2021   Lab Results  Component Value Date   HDL 37 (L) 04/13/2021   Lab Results  Component Value Date   LDLCALC 114 (H) 04/13/2021   Lab Results  Component Value Date   TRIG 119 04/13/2021   Lab Results  Component Value Date   CHOLHDL 4.7 04/13/2021   No results found for: "HGBA1C"    Assessment & Plan:   Problem List Items Addressed This Visit       Other   Encounter for examination following treatment at hospital - Primary    Patient continues to have dysuria UA negative for nitrites positive for leukocytes and blood  - sulfamethoxazole-trimethoprim (BACTRIM DS) 800-160 MG tablet; Take 1 tablet by mouth 2 (two) times daily for 7 days.  Dispense: 14 tablet; Refill: 0  Has upcoming appointment with urology      Impaired mobility and ADLs    Needs assistance with activities of daily living Moves around mainly using a wheelchair due to his history of MS Referral for [facility] is pending       Suspected UTI    UA positive for moderate leukocytes, large blood.  Negative for nitrates Urine sent for culture  - sulfamethoxazole-trimethoprim (BACTRIM DS) 800-160 MG tablet; Take 1 tablet by mouth 2 (two) times daily for  7 days.  Dispense: 14 tablet; Refill: 0  Importance of good perineal hygiene discussed Courage to drink at least 64 ounces of water daily to maintain hydration Has upcoming appointment with urology today      Relevant Medications   sulfamethoxazole-trimethoprim (BACTRIM DS) 800-160 MG tablet   Other Relevant Orders   POCT urinalysis dipstick   Urine Culture   Urinary incontinence    Continue oxybutynin 10 mg daily Encouraged to follow-up with urology       Meds ordered this encounter  Medications   sulfamethoxazole-trimethoprim (BACTRIM DS) 800-160 MG tablet    Sig: Take 1 tablet by mouth 2 (two) times daily for 7 days.    Dispense:  14 tablet    Refill:  0    Follow-up: No follow-ups on file.    Donell Beers, FNP

## 2023-02-06 NOTE — Assessment & Plan Note (Signed)
Continue oxybutynin 10 mg daily Encouraged to follow-up with urology

## 2023-02-06 NOTE — Assessment & Plan Note (Addendum)
UA positive for moderate leukocytes, large blood.  Negative for nitrates Urine sent for culture  - sulfamethoxazole-trimethoprim (BACTRIM DS) 800-160 MG tablet; Take 1 tablet by mouth 2 (two) times daily for 7 days.  Dispense: 14 tablet; Refill: 0  Importance of good perineal hygiene discussed Courage to drink at least 64 ounces of water daily to maintain hydration Has upcoming appointment with urology today

## 2023-02-06 NOTE — Assessment & Plan Note (Signed)
Patient continues to have dysuria UA negative for nitrites positive for leukocytes and blood  - sulfamethoxazole-trimethoprim (BACTRIM DS) 800-160 MG tablet; Take 1 tablet by mouth 2 (two) times daily for 7 days.  Dispense: 14 tablet; Refill: 0  Has upcoming appointment with urology

## 2023-02-06 NOTE — Patient Instructions (Signed)
1. Encounter for examination following treatment at hospital   2. Impaired mobility and ADLs   3. Urinary tract infection associated with cystostomy catheter, initial encounter (HCC)  - POCT urinalysis dipstick - Urine Culture  4. Suspected UTI  - sulfamethoxazole-trimethoprim (BACTRIM DS) 800-160 MG tablet; Take 1 tablet by mouth 2 (two) times daily for 7 days.  Dispense: 14 tablet; Refill: 0    It is important that you exercise regularly at least 30 minutes 5 times a week as tolerated  Think about what you will eat, plan ahead. Choose " clean, green, fresh or frozen" over canned, processed or packaged foods which are more sugary, salty and fatty. 70 to 75% of food eaten should be vegetables and fruit. Three meals at set times with snacks allowed between meals, but they must be fruit or vegetables. Aim to eat over a 12 hour period , example 7 am to 7 pm, and STOP after  your last meal of the day. Drink water,generally about 64 ounces per day, no other drink is as healthy. Fruit juice is best enjoyed in a healthy way, by EATING the fruit.  Thanks for choosing [facility] we consider it a privelige to serve you.

## 2023-02-09 ENCOUNTER — Other Ambulatory Visit: Payer: Self-pay | Admitting: Nurse Practitioner

## 2023-02-09 DIAGNOSIS — R3989 Other symptoms and signs involving the genitourinary system: Secondary | ICD-10-CM

## 2023-02-09 LAB — URINE CULTURE

## 2023-02-09 MED ORDER — NITROFURANTOIN MONOHYD MACRO 100 MG PO CAPS
100.0000 mg | ORAL_CAPSULE | Freq: Two times a day (BID) | ORAL | 0 refills | Status: AC
Start: 2023-02-09 — End: 2023-02-16

## 2023-02-14 ENCOUNTER — Other Ambulatory Visit: Payer: Self-pay | Admitting: Urology

## 2023-02-19 ENCOUNTER — Emergency Department (HOSPITAL_COMMUNITY)
Admission: EM | Admit: 2023-02-19 | Discharge: 2023-02-20 | Disposition: A | Discharge status: Home / Self Care | Payer: Medicaid Other | Attending: Emergency Medicine | Admitting: Emergency Medicine

## 2023-02-19 ENCOUNTER — Other Ambulatory Visit: Payer: Self-pay

## 2023-02-19 ENCOUNTER — Encounter (HOSPITAL_COMMUNITY): Payer: Self-pay

## 2023-02-19 DIAGNOSIS — Z743 Need for continuous supervision: Secondary | ICD-10-CM | POA: Diagnosis not present

## 2023-02-19 DIAGNOSIS — R300 Dysuria: Secondary | ICD-10-CM

## 2023-02-19 DIAGNOSIS — Y732 Prosthetic and other implants, materials and accessory gastroenterology and urology devices associated with adverse incidents: Secondary | ICD-10-CM | POA: Insufficient documentation

## 2023-02-19 DIAGNOSIS — T83098A Other mechanical complication of other indwelling urethral catheter, initial encounter: Secondary | ICD-10-CM | POA: Insufficient documentation

## 2023-02-19 DIAGNOSIS — T839XXA Unspecified complication of genitourinary prosthetic device, implant and graft, initial encounter: Secondary | ICD-10-CM

## 2023-02-19 DIAGNOSIS — M549 Dorsalgia, unspecified: Secondary | ICD-10-CM | POA: Diagnosis not present

## 2023-02-19 HISTORY — DX: Other cystostomy status: Z93.59

## 2023-02-19 NOTE — ED Triage Notes (Signed)
Pt arrives via [facility] from home for c/o dysuria, but unsure exactly how many days he's had symptoms. Pt has a suprapubic catheter in place but at this time patient has incontinence briefs on which are heavily saturated with urine, and there is purulent green drainage coming from the suprapubic site around catheter. Pt also reports lower abdominal pain and left flank pain. Denies fever, chills. Pt reports he is supposed to have home healthcare services but states "they couldn't find one."

## 2023-02-19 NOTE — ED Provider Notes (Signed)
[place] EMERGENCY DEPARTMENT AT [facility] Provider Note  CSN: [id number] Arrival date & time: 02/19/23 2229  Chief Complaint(s) Dysuria  HPI Bradley Brandt is a 42 y.o. male {Add pertinent medical, surgical, social history, OB history to HPI:1}    Dysuria Presenting symptoms: dysuria     Past Medical History Past Medical History:  Diagnosis Date   Bipolar 1 disorder (HCC)    Bipolar disorder, unspecified (HCC) 01/03/2013   MS (multiple sclerosis) (HCC) dx 2006   relapsing-remitting right sided weakness   Neuromuscular disorder (HCC)    MS   Suprapubic catheter [facility])    Patient Active Problem List   Diagnosis Date Noted   Suspected UTI 02/06/2023   Urinary incontinence 02/06/2023   Suprapubic catheter (HCC) 01/31/2023   Marijuana user 01/17/2023   Tobacco use disorder, continuous 01/17/2023   Impaired mobility and ADLs 01/17/2023   UTI (urinary tract infection) 10/30/2022   Bipolar 1 disorder (HCC) 10/30/2022   Acute urinary retention 08/01/2022   Fever 08/01/2022   Rhabdomyolysis 07/30/2022   Multiple sclerosis exacerbation (HCC) 07/12/2022   Marijuana use 11/22/2020   History of marijuana use 11/22/2020   Chronic pain syndrome 11/21/2020   Encounter for examination following treatment at hospital 11/21/2020   Disorder of skeletal system 11/21/2020   Problems influencing health status 11/21/2020   MS (multiple sclerosis) (HCC)    Neuromuscular disorder (HCC)    Bipolar disorder in full remission (HCC) 11/16/2020   Other insomnia 08/11/2020   Generalized anxiety disorder 08/11/2020   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HCC) 12/31/2017   Optic neuritis 08/02/2017   High risk medication use 08/02/2017   Vitamin D deficiency 09/19/2016   Hypercholesterolemia 09/19/2016   Numbness 12/16/2015   Spastic gait 12/16/2015   Urinary hesitancy 12/16/2015   Low back pain with sciatica 09/28/2014   Polysubstance dependence (HCC) 02/09/2014   Combined drug  dependence excluding opioids (HCC) 02/09/2014   Relapsing remitting multiple sclerosis (HCC) 05/28/2013   Elevated BP 01/06/2013   Bipolar disorder, unspecified (HCC) 01/03/2013   Bipolar disorder (HCC) 01/03/2013   Right hemiparesis (HCC) 01/02/2013   Multiple sclerosis flare 01/02/2013   Hemiplegia (HCC) 01/02/2013   Home Medication(s) Prior to Admission medications   Medication Sig Start Date End Date Taking? Authorizing Provider  acetaminophen (TYLENOL) 500 MG tablet Take 500 mg by mouth every 8 (eight) hours as needed for moderate pain.    [provider]  baclofen (LIORESAL) 20 MG tablet Take 1 tablet (20 mg total) by mouth 3 (three) times daily. 01/17/23   Donell Beers, FNP  DULoxetine (CYMBALTA) 30 MG capsule Take 1 capsule (30 mg total) by mouth daily. 01/17/23   Donell Beers, FNP  feeding supplement (ENSURE ENLIVE / ENSURE PLUS) LIQD Take 237 mLs by mouth 3 (three) times daily between meals. Patient not taking: Reported on 01/17/2023 08/10/22   Karoline Caldwell, MD  gabapentin (NEURONTIN) 400 MG capsule Take 1 capsule (400 mg total) by mouth 3 (three) times daily. 01/19/23   Paseda, Bastarache Kay, FNP  lidocaine (HM LIDOCAINE PATCH) 4 % Place 1 patch onto the skin daily. Patient not taking: Reported on 01/17/2023 10/12/21   Orion Crook I, NP  melatonin 3 MG TABS tablet Take 1 tablet (3 mg total) by mouth at bedtime as needed. 01/17/23   Paseda, Seith Kay, FNP  metroNIDAZOLE (METROCREAM) 0.75 % cream Apply 1 Application topically 2 (two) times daily. Patient not taking: Reported on 01/17/2023    [provider]  natalizumab (TYSABRI) 300 MG/15ML injection Inject 15 mLs (300 mg total) into the vein every 28 (twenty-eight) days. 09/05/16   Sater, Pearletha Furl, MD  oxybutynin (DITROPAN-XL) 10 MG 24 hr tablet Take 1 tablet (10 mg total) by mouth daily. 01/17/23   Paseda, Richer Kay, FNP  QUEtiapine (SEROQUEL) 200 MG tablet Take 1 tablet (200 mg total) by mouth at  bedtime. 01/17/23   Donell Beers, FNP                                                                                                                                    Allergies Lithium  Review of Systems Review of Systems  Genitourinary:  Positive for dysuria.   As noted in HPI  Physical Exam Vital Signs  I have reviewed the triage vital signs BP (!) 144/114   Pulse 64   Temp 98.4 F (36.9 C)   Resp 14   Ht 5\' 11"  (1.803 m)   SpO2 98%   BMI 23.96 kg/m  *** Physical Exam  ED Results and Treatments Labs (all labs ordered are listed, but only abnormal results are displayed) Labs Reviewed - No data to display                                                                                                                       EKG  EKG Interpretation Date/Time:    Ventricular Rate:    PR Interval:    QRS Duration:    QT Interval:    QTC Calculation:   R Axis:      Text Interpretation:         Radiology No results found.  Medications Ordered in ED Medications - No data to display Procedures Procedures  (including critical care time) Medical Decision Making / ED Course   Medical Decision Making   ***    Final Clinical Impression(s) / ED Diagnoses Final diagnoses:  None    This chart was dictated using voice recognition software.  Despite best efforts to proofread,  errors can occur which can change the documentation meaning.

## 2023-02-20 ENCOUNTER — Encounter: Payer: Self-pay | Admitting: Neurology

## 2023-02-20 MED ORDER — HYDROCODONE-ACETAMINOPHEN 7.5-325 MG/15ML PO SOLN
10.0000 mL | Freq: Once | ORAL | Status: AC
  Administered 2023-02-20: 10 mL via ORAL
  Filled 2023-02-20: qty 15

## 2023-02-20 MED ORDER — NITROFURANTOIN MONOHYD MACRO 100 MG PO CAPS
100.0000 mg | ORAL_CAPSULE | Freq: Once | ORAL | Status: AC
  Administered 2023-02-20: 100 mg via ORAL
  Filled 2023-02-20: qty 1

## 2023-02-20 MED ORDER — NITROFURANTOIN MONOHYD MACRO 100 MG PO CAPS: 100.0000 mg | ORAL_CAPSULE | Freq: Two times a day (BID) | ORAL | 0 refills | Status: AC

## 2023-02-21 ENCOUNTER — Encounter: Payer: Self-pay | Admitting: Neurology

## 2023-02-21 LAB — URINE CULTURE

## 2023-02-22 DIAGNOSIS — R338 Other retention of urine: Secondary | ICD-10-CM | POA: Diagnosis not present

## 2023-02-22 DIAGNOSIS — G35 Multiple sclerosis: Secondary | ICD-10-CM | POA: Diagnosis not present

## 2023-02-23 DIAGNOSIS — G35 Multiple sclerosis: Secondary | ICD-10-CM | POA: Diagnosis not present

## 2023-02-24 DIAGNOSIS — M6281 Muscle weakness (generalized): Secondary | ICD-10-CM | POA: Diagnosis not present

## 2023-02-24 DIAGNOSIS — M6282 Rhabdomyolysis: Secondary | ICD-10-CM | POA: Diagnosis not present

## 2023-02-24 DIAGNOSIS — G35 Multiple sclerosis: Secondary | ICD-10-CM | POA: Diagnosis not present

## 2023-02-26 ENCOUNTER — Other Ambulatory Visit (HOSPITAL_COMMUNITY): Payer: Self-pay

## 2023-02-26 ENCOUNTER — Telehealth: Payer: Self-pay

## 2023-02-26 ENCOUNTER — Encounter: Payer: Self-pay | Admitting: Neurology

## 2023-02-26 NOTE — Telephone Encounter (Signed)
ERROR

## 2023-02-28 ENCOUNTER — Encounter: Payer: Self-pay | Admitting: Neurology

## 2023-02-28 ENCOUNTER — Ambulatory Visit (INDEPENDENT_AMBULATORY_CARE_PROVIDER_SITE_OTHER): Payer: Medicaid Other

## 2023-02-28 ENCOUNTER — Telehealth: Payer: Self-pay | Admitting: Nurse Practitioner

## 2023-02-28 VITALS — BP 105/64 | HR 58 | Temp 97.3°F | Resp 18 | Ht 71.0 in | Wt 171.0 lb

## 2023-02-28 DIAGNOSIS — G35 Multiple sclerosis: Secondary | ICD-10-CM

## 2023-02-28 MED ORDER — SODIUM CHLORIDE 0.9 % IV SOLN
300.0000 mg | Freq: Once | INTRAVENOUS | Status: AC
  Administered 2023-02-28: 300 mg via INTRAVENOUS
  Filled 2023-02-28: qty 0

## 2023-02-28 MED ORDER — ACETAMINOPHEN 325 MG PO TABS
650.0000 mg | ORAL_TABLET | Freq: Once | ORAL | Status: AC
  Administered 2023-02-28: 650 mg via ORAL
  Filled 2023-02-28: qty 2

## 2023-02-28 MED ORDER — LORATADINE 10 MG PO TABS
10.0000 mg | ORAL_TABLET | Freq: Once | ORAL | Status: AC
  Administered 2023-02-28: 10 mg via ORAL
  Filled 2023-02-28: qty 1

## 2023-02-28 MED ORDER — SODIUM CHLORIDE 0.9 % IV SOLN: INTRAVENOUS | Status: DC

## 2023-02-28 NOTE — Telephone Encounter (Signed)
Forms given to [facility]. KH

## 2023-02-28 NOTE — Telephone Encounter (Signed)
Checking with Nelva Bush on the location of the forms. KH

## 2023-02-28 NOTE — Progress Notes (Signed)
Diagnosis: Multiple Sclerosis  Provider:  Chilton Greathouse MD  Procedure: IV Infusion  IV Type: Peripheral, IV Location: R Antecubital  Tysabri (Natalizumab), Dose: 300 mg  Infusion Start Time: 1012  Infusion Stop Time: 1121  Post Infusion IV Care: Patient declined observation and Peripheral IV Discontinued  Discharge: Condition: Good, Destination: Home . AVS Declined  Performed by:  Loney Hering, LPN

## 2023-02-28 NOTE — Telephone Encounter (Signed)
Pt's father called stating that the forms he dropped off yesterday are very urgent because he needs his CAPP so he can stay home instead of going back to nursing home.   Pts father states he apologizes that he was late getting the form in but that the 'people in [place]' gave him some extra time to get it back to them. He said it needs to be completed in the next 10 days please so he Zane does not have to go back to the nursing home

## 2023-03-01 DIAGNOSIS — H524 Presbyopia: Secondary | ICD-10-CM | POA: Diagnosis not present

## 2023-03-01 NOTE — Progress Notes (Addendum)
COVID Vaccine received:  []  No [x]  Yes Date of any COVID positive Test in last 90 days:  none  PCP - Edwin Dada, FNP Cardiologist -  Neurology- Shon Millet, DO  Chest x-ray - 10-30-2022  1v  Epic EKG -  10-30-2022   Epic Stress Test -  ECHO -  Cardiac Cath -   PCR screen: []  Ordered & Completed           []   No Order but Needs PROFEND           [x]   N/A for this surgery  Surgery Plan:  [x]  Ambulatory   []  Outpatient in bed  []  Admit  Anesthesia:    []  General  []  Spinal  [x]   Choice []   MAC  Bowel Prep - [x]  No  []   Yes ______  Pacemaker / ICD device [x]  No []  Yes   Spinal Cord Stimulator:[x]  No []  Yes       History of Sleep Apnea? [x]  No []  Yes   CPAP used?- [x]  No []  Yes    Does the patient monitor blood sugar?  []  No []  Yes  [x]  N/A  Patient has: [x]  NO Hx DM   []  Pre-DM   []  DM1  []   DM2 Last A1c was:        on       Blood Thinner / Instructions:  none Aspirin Instructions:  none  ERAS Protocol Ordered: [x]  No  []  Yes Patient is to be NPO after: midnight prior  Comments: Patient states that he does not have a POA and he is able to make his own decisions about his healthcare.  Activity level: Patient is unable to climb a flight of stairs without difficulty due to multiple sclerosis.  Patient can not perform ADLs without assistance, he has family members and CAPP workers who help him.   Anesthesia review: Has MS, smoker, Bipolar, GAD, Right hemiplegia, CPS, substance abuse  Patient denies shortness of breath, fever, cough and chest pain at PAT appointment.  Patient verbalized understanding and agreement to the Pre-Surgical Instructions that were given to them at this PAT appointment. Patient was also educated of the need to review these PAT instructions again prior to his surgery.I reviewed the appropriate phone numbers to call if they have any and questions or concerns.

## 2023-03-01 NOTE — Patient Instructions (Addendum)
SURGICAL WAITING ROOM VISITATION Patients having surgery or a procedure may have no more than 2 support people in the waiting area - these visitors may rotate in the visitor waiting room.   Due to an increase in RSV and influenza rates and associated hospitalizations, children ages 34 and under may not visit patients in [facility] hospitals. If the patient needs to stay at the hospital during part of their recovery, the visitor guidelines for inpatient rooms apply.  PRE-OP VISITATION  Pre-op nurse will coordinate an appropriate time for 1 support person to accompany the patient in pre-op.  This support person may not rotate.  This visitor will be contacted when the time is appropriate for the visitor to come back in the pre-op area.  Please refer to the [facility] website for the visitor guidelines for Inpatients (after your surgery is over and you are in a regular room).  You are not required to quarantine at this time prior to your surgery. However, you must do this: Hand Hygiene often Do NOT share personal items Notify your provider if you are in close contact with someone who has COVID or you develop fever 100.4 or greater, new onset of sneezing, cough, sore throat, shortness of breath or body aches.  If you test positive for Covid or have been in contact with anyone that has tested positive in the last 10 days please notify you surgeon.    Your procedure is scheduled on:  Monday  March 12, 2023  Report to [facility] Main Entrance: [place] entrance where the [employer] is available.   Report to admitting at: 07:45    AM  Call this number if you have any questions or problems the morning of surgery [phone]  DO NOT EAT OR DRINK ANYTHING AFTER MIDNIGHT THE NIGHT PRIOR TO YOUR SURGERY / PROCEDURE.   FOLLOW BOWEL PREP AND ANY ADDITIONAL PRE OP INSTRUCTIONS YOU RECEIVED FROM YOUR SURGEON'S OFFICE!!!   Oral Hygiene is also important to reduce your risk of infection.         Remember - BRUSH YOUR TEETH THE MORNING OF SURGERY WITH YOUR REGULAR TOOTHPASTE  Do NOT smoke after Midnight the night before surgery.   STOP TAKING all Vitamins, Herbs and supplements 1 week before your surgery.   Take ONLY these medicines the morning of surgery with A SIP OF WATER: oxybutinin (Ditropan), Duloxetine (Cymbalta), gabapentin                    You may not have any metal on your body including jewelry, and body piercing  Do not wear , lotions, powders, cologne, or deodorant  Men may shave face and neck.  Contacts, Hearing Aids, dentures or bridgework may not be worn into surgery. DENTURES WILL BE REMOVED PRIOR TO SURGERY PLEASE DO NOT APPLY "Poly grip" OR ADHESIVES!!!  Patients discharged on the day of surgery will not be allowed to drive home.  Someone NEEDS to stay with you for the first 24 hours after anesthesia.  Do not bring your home medications to the hospital. The Pharmacy will dispense medications listed on your medication list to you during your admission in the Hospital.   Please read over the following fact sheets you were given: IF YOU HAVE QUESTIONS ABOUT YOUR PRE-OP INSTRUCTIONS, PLEASE CALL [phone].   [place] - Preparing for Surgery Before surgery, you can play an important role.  Because skin is not sterile, your skin needs to be as free of  germs as possible.  You can reduce the number of germs on your skin by washing with CHG (chlorahexidine gluconate) soap before surgery.  CHG is an antiseptic cleaner which kills germs and bonds with the skin to continue killing germs even after washing. Please DO NOT use if you have an allergy to CHG or antibacterial soaps.  If your skin becomes reddened/irritated stop using the CHG and inform your nurse when you arrive at Short Stay. Do not shave (including legs and underarms) for at least 48 hours prior to the first CHG shower.  You may shave your face/neck.  Please follow these instructions  carefully:  1.  Shower with CHG Soap the night before surgery and the  morning of surgery.  2.  If you choose to wash your hair, wash your hair first as usual with your normal  shampoo.  3.  After you shampoo, rinse your hair and body thoroughly to remove the shampoo.                             4.  Use CHG as you would any other liquid soap.  You can apply chg directly to the skin and wash.  Gently with a scrungie or clean washcloth.  5.  Apply the CHG Soap to your body ONLY FROM THE NECK DOWN.   Do not use on face/ open                           Wound or open sores. Avoid contact with eyes, ears mouth and genitals (private parts).                       Wash face,  Genitals (private parts) with your normal soap.             6.  Wash thoroughly, paying special attention to the area where your  surgery  will be performed.  7.  Thoroughly rinse your body with warm water from the neck down.  8.  DO NOT shower/wash with your normal soap after using and rinsing off the CHG Soap.            9.  Pat yourself dry with a clean towel.            10.  Wear clean pajamas.            11.  Place clean sheets on your bed the night of your first shower and do not  sleep with pets.  ON THE DAY OF SURGERY : Do not apply any lotions/deodorants the morning of surgery.  Please wear clean clothes to the hospital/surgery center.    FAILURE TO FOLLOW THESE INSTRUCTIONS MAY RESULT IN THE CANCELLATION OF YOUR SURGERY  PATIENT SIGNATURE_________________________________  NURSE SIGNATURE__________________________________  ________________________________________________________________________

## 2023-03-02 ENCOUNTER — Other Ambulatory Visit: Payer: Self-pay

## 2023-03-02 ENCOUNTER — Encounter (HOSPITAL_COMMUNITY): Payer: Self-pay

## 2023-03-02 ENCOUNTER — Encounter (HOSPITAL_COMMUNITY)
Admission: RE | Admit: 2023-03-02 | Discharge: 2023-03-02 | Disposition: A | Discharge status: Home / Self Care | Payer: Medicaid Other | Source: Ambulatory Visit | Attending: Urology | Admitting: Urology

## 2023-03-02 VITALS — BP 102/69 | HR 74 | Temp 98.5°F | Resp 14 | Ht 71.0 in | Wt 169.8 lb

## 2023-03-02 DIAGNOSIS — G35 Multiple sclerosis: Secondary | ICD-10-CM | POA: Insufficient documentation

## 2023-03-02 DIAGNOSIS — Z01812 Encounter for preprocedural laboratory examination: Secondary | ICD-10-CM | POA: Diagnosis present

## 2023-03-02 DIAGNOSIS — Z01818 Encounter for other preprocedural examination: Secondary | ICD-10-CM

## 2023-03-02 HISTORY — DX: Other psychoactive substance abuse, uncomplicated: F19.10

## 2023-03-02 HISTORY — DX: Spastic hemiplegia affecting right dominant side: G81.11

## 2023-03-02 HISTORY — DX: Pneumonia, unspecified organism: J18.9

## 2023-03-02 HISTORY — DX: Anxiety disorder, unspecified: F41.9

## 2023-03-02 LAB — CBC
HCT: 44.3 % (ref 39.0–52.0)
Hemoglobin: 14.7 g/dL (ref 13.0–17.0)
MCH: 30.9 pg (ref 26.0–34.0)
MCHC: 33.2 g/dL (ref 30.0–36.0)
MCV: 93.3 fL (ref 80.0–100.0)
Platelets: 203 10*3/uL (ref 150–400)
RBC: 4.75 MIL/uL (ref 4.22–5.81)
RDW: 14.2 % (ref 11.5–15.5)
WBC: 8.1 10*3/uL (ref 4.0–10.5)
nRBC: 0.9 % — ABNORMAL HIGH (ref 0.0–0.2)

## 2023-03-02 LAB — BASIC METABOLIC PANEL
Anion gap: 7 (ref 5–15)
BUN: 17 mg/dL (ref 6–20)
CO2: 27 mmol/L (ref 22–32)
Calcium: 9.3 mg/dL (ref 8.9–10.3)
Chloride: 103 mmol/L (ref 98–111)
Creatinine, Ser: 1.06 mg/dL (ref 0.61–1.24)
GFR, Estimated: >60 mL/min (ref >60)
Glucose, Bld: 91 mg/dL (ref 70–99)
Potassium: 4.2 mmol/L (ref 3.5–5.1)
Sodium: 137 mmol/L (ref 135–145)

## 2023-03-05 ENCOUNTER — Encounter: Payer: Self-pay | Admitting: Neurology

## 2023-03-05 DIAGNOSIS — G35 Multiple sclerosis: Secondary | ICD-10-CM | POA: Diagnosis not present

## 2023-03-06 ENCOUNTER — Other Ambulatory Visit (HOSPITAL_COMMUNITY): Payer: Self-pay

## 2023-03-07 DIAGNOSIS — G35 Multiple sclerosis: Secondary | ICD-10-CM | POA: Diagnosis not present

## 2023-03-08 ENCOUNTER — Emergency Department (HOSPITAL_COMMUNITY)
Admission: EM | Admit: 2023-03-08 | Discharge: 2023-03-09 | Disposition: A | Discharge status: Home / Self Care | Payer: Medicaid Other | Attending: Emergency Medicine | Admitting: Emergency Medicine

## 2023-03-08 DIAGNOSIS — D72829 Elevated white blood cell count, unspecified: Secondary | ICD-10-CM | POA: Insufficient documentation

## 2023-03-08 DIAGNOSIS — Y732 Prosthetic and other implants, materials and accessory gastroenterology and urology devices associated with adverse incidents: Secondary | ICD-10-CM | POA: Diagnosis not present

## 2023-03-08 DIAGNOSIS — T83198A Other mechanical complication of other urinary devices and implants, initial encounter: Secondary | ICD-10-CM | POA: Diagnosis not present

## 2023-03-08 DIAGNOSIS — T83098A Other mechanical complication of other indwelling urethral catheter, initial encounter: Secondary | ICD-10-CM | POA: Insufficient documentation

## 2023-03-08 DIAGNOSIS — T83010A Breakdown (mechanical) of cystostomy catheter, initial encounter: Secondary | ICD-10-CM

## 2023-03-08 DIAGNOSIS — R339 Retention of urine, unspecified: Secondary | ICD-10-CM | POA: Diagnosis present

## 2023-03-08 DIAGNOSIS — G35 Multiple sclerosis: Secondary | ICD-10-CM | POA: Diagnosis not present

## 2023-03-08 LAB — BASIC METABOLIC PANEL
Anion gap: 6 (ref 5–15)
BUN: 10 mg/dL (ref 6–20)
CO2: 28 mmol/L (ref 22–32)
Calcium: 9.1 mg/dL (ref 8.9–10.3)
Chloride: 102 mmol/L (ref 98–111)
Creatinine, Ser: 0.85 mg/dL (ref 0.61–1.24)
GFR, Estimated: >60 mL/min (ref >60)
Glucose, Bld: 100 mg/dL — ABNORMAL HIGH (ref 70–99)
Potassium: 3.5 mmol/L (ref 3.5–5.1)
Sodium: 136 mmol/L (ref 135–145)

## 2023-03-08 LAB — CBC
HCT: 40.7 % (ref 39.0–52.0)
Hemoglobin: 13.6 g/dL (ref 13.0–17.0)
MCH: 30.8 pg (ref 26.0–34.0)
MCHC: 33.4 g/dL (ref 30.0–36.0)
MCV: 92.3 fL (ref 80.0–100.0)
Platelets: 198 10*3/uL (ref 150–400)
RBC: 4.41 MIL/uL (ref 4.22–5.81)
RDW: 14.5 % (ref 11.5–15.5)
WBC: 10.7 10*3/uL — ABNORMAL HIGH (ref 4.0–10.5)
nRBC: 0.7 % — ABNORMAL HIGH (ref 0.0–0.2)

## 2023-03-08 MED ORDER — HYDROCODONE-ACETAMINOPHEN 5-325 MG PO TABS
1.0000 | ORAL_TABLET | Freq: Once | ORAL | Status: AC
  Administered 2023-03-08: 1 via ORAL
  Filled 2023-03-08: qty 1

## 2023-03-08 NOTE — ED Provider Triage Note (Signed)
Emergency Medicine Provider Triage Evaluation Note  MANTAS DONIA , a 42 y.o. male  was evaluated in triage.  Pt complains of problem with urinary catheter. Has suprapubic catheter, hx of MS. Not sure when his catheter stops draining, thinks it was within the past 2-4 days. Complaining of pain associated with this  Review of Systems  Positive: Abd pain Negative: Fever, chills  Physical Exam  BP 123/64 (BP Location: Right Arm)   Pulse (!) 115   Temp 98.6 F (37 C) (Oral)   Resp 18   SpO2 100%  Gen:   Awake, no distress   Resp:  Normal effort  MSK:   Moves extremities without difficulty  Other:  Suprapubic catheter in place with surrounding erythema and tenderness, rest of abdomen soft and non-tender  Medical Decision Making  Medically screening exam initiated at 5:44 PM.  Appropriate orders placed.  DESTEN DETHLEFSEN was informed that the remainder of the evaluation will be completed by another provider, this initial triage assessment does not replace that evaluation, and the importance of remaining in the ED until their evaluation is complete.  Workup initiated   Su Monks, PA-C 03/08/23 1746

## 2023-03-08 NOTE — ED Provider Notes (Signed)
[place] EMERGENCY DEPARTMENT AT [facility] Provider Note   CSN: [id number] Arrival date & time: 03/08/23  1709     History  Chief Complaint  Patient presents with   Urinary Retention    Bradley Brandt is a 42 y.o. male with medical history of anxiety, bipolar 1 disorder, MS, substance abuse, suprapubic catheter.  Patient presents to ED for evaluation of urinary retention.  The patient reports that for the last 3 to 4 days his suprapubic catheter has not drained any urine.  He reports that he is draining urine from his penis. He reports that his bag has remained empty.  He states that this is a common problem for him and he is set to have Botox injection with Dr. Alvester Morin of urology on 9/23.  He denies nausea, vomiting, fevers, abdominal pain at home.  He reports he is unsure when he had his suprapubic catheter placed.  He states that when he is able to urinate it burns.  Patient goes on to state that he is on antibiotics currently, Keflex, for UTI.  He states that he is taking this once a day, not 4 times a day.  HPI     Home Medications Prior to Admission medications   Medication Sig Start Date End Date Taking? Authorizing Provider  baclofen (LIORESAL) 20 MG tablet Take 1 tablet (20 mg total) by mouth 3 (three) times daily. 01/17/23   Donell Beers, FNP  DULoxetine (CYMBALTA) 30 MG capsule Take 1 capsule (30 mg total) by mouth daily. 01/17/23   Paseda, Newberry Kay, FNP  gabapentin (NEURONTIN) 400 MG capsule Take 1 capsule (400 mg total) by mouth 3 (three) times daily. 01/19/23   Paseda, Berndt Kay, FNP  natalizumab (TYSABRI) 300 MG/15ML injection Inject 15 mLs (300 mg total) into the vein every 28 (twenty-eight) days. 09/05/16   Sater, Pearletha Furl, MD  oxybutynin (DITROPAN-XL) 10 MG 24 hr tablet Take 1 tablet (10 mg total) by mouth daily. 01/17/23   Paseda, Pautz Kay, FNP  QUEtiapine (SEROQUEL) 200 MG tablet Take 1 tablet (200 mg total) by mouth at bedtime. 01/17/23    Donell Beers, FNP      Allergies    Lithium    Review of Systems   Review of Systems  Constitutional:  Negative for fever.  Gastrointestinal:  Negative for abdominal pain, nausea and vomiting.  Genitourinary:  Positive for difficulty urinating and dysuria.  All other systems reviewed and are negative.   Physical Exam Updated Vital Signs BP 119/60 (BP Location: Left Arm)   Pulse 64   Temp 98 F (36.7 C) (Oral)   Resp 18   SpO2 97%  Physical Exam Vitals and nursing note reviewed.  Constitutional:      General: He is not in acute distress.    Appearance: Normal appearance. He is not ill-appearing, toxic-appearing or diaphoretic.  HENT:     Head: Normocephalic and atraumatic.     Nose: Nose normal.     Mouth/Throat:     Mouth: Mucous membranes are moist.     Pharynx: Oropharynx is clear.  Eyes:     Extraocular Movements: Extraocular movements intact.     Conjunctiva/sclera: Conjunctivae normal.     Pupils: Pupils are equal, round, and reactive to light.  Cardiovascular:     Rate and Rhythm: Normal rate and regular rhythm.  Pulmonary:     Effort: Pulmonary effort is normal.     Breath sounds: Normal breath sounds. No wheezing.  Abdominal:     General: Abdomen is flat. Bowel sounds are normal.     Palpations: Abdomen is soft.     Tenderness: There is no abdominal tenderness.     Comments: No suprapubic tenderness.  Indwelling suprapubic Foley catheter present.  No signs of infection present.  Musculoskeletal:     Cervical back: Normal range of motion and neck supple.  Skin:    General: Skin is warm and dry.     Capillary Refill: Capillary refill takes less than 2 seconds.  Neurological:     Mental Status: He is alert and oriented to person, place, and time. Mental status is at baseline.     ED Results / Procedures / Treatments   Labs (all labs ordered are listed, but only abnormal results are displayed) Labs Reviewed  CBC - Abnormal; Notable for the  following components:      Result Value   WBC 10.7 (*)    nRBC 0.7 (*)    All other components within normal limits  BASIC METABOLIC PANEL - Abnormal; Notable for the following components:   Glucose, Bld 100 (*)    All other components within normal limits  URINALYSIS, ROUTINE W REFLEX MICROSCOPIC    EKG None  Radiology No results found.  Procedures Procedures   Medications Ordered in ED Medications  HYDROcodone-acetaminophen (NORCO/VICODIN) 5-325 MG per tablet 1 tablet (1 tablet Oral Given 03/08/23 2247)    ED Course/ Medical Decision Making/ A&P    Medical Decision Making Risk Prescription drug management.   42 year old no presents to the ED for evaluation.  Please see HPI for further details.  On examination the patient is afebrile, nontachycardic.  Patient initially arrived tachycardic however pulse rate has normalized at this time.  Lung sounds are clear bilaterally, nontoxic.  Abdomen is soft and compressible throughout, no suprapubic tenderness.  Neurological examination at baseline.  Patient suprapubic catheter shows no signs of infection, erythema or drainage.  Bedside ultrasound utilized.  Ultrasound reveals no distended bladder, suprapubic catheter is in place.  Patient reports that he is currently urinating out of his penis presumably pain around the catheter.  His creatinine here is not elevated, 0.85.  His metabolic panel is unremarkable.  His CBC shows a leukocytosis of 10.7 which could be stress response.  Patient urine sample pending at this time.  Condom catheter placed on patient to a collect a sample of urine.  Patient given 5 mg hydrocodone for pain.  Update: Patient and his significant other have voiced that they are getting inpatient.  They are requesting discharge at this time.  I advised patient and significant other that at this time we are trying to collect urine sample but they insist on discharge.  Patient advised me that he was taking Keflex for  UTI but is only taking it once a day.  I advised him he should take this 4 times a day.  Attempted to obtain urine sample for analyzation however patient states that he wants to leave at this time.  Bedside ultrasound shows no urinary retention, he is draining urine out of his penis, no creatinine elevation.  At this time, patient will be discharged home.  Patient case discussed with Dr. Rush Landmark, attending, who voices agreement with plan management.  He will follow-up with urology.  Encouraged to return to the ED with any new or worsening signs or symptoms.   Final Clinical Impression(s) / ED Diagnoses Final diagnoses:  Suprapubic catheter dysfunction, initial encounter (HCC)  Rx / DC Orders ED Discharge Orders     None         Clent Ridges 03/09/23 1610    Tegeler, Canary Brim, MD 03/10/23 2021

## 2023-03-08 NOTE — ED Provider Notes (Incomplete)
[place] EMERGENCY DEPARTMENT AT [facility] Provider Note   CSN: [id number] Arrival date & time: 03/08/23  1709     History  Chief Complaint  Patient presents with  . Urinary Retention    Bradley Brandt is a 42 y.o. male with medical history of anxiety, bipolar 1 disorder, MS, substance abuse, suprapubic catheter.  Patient presents to ED for evaluation of urinary retention.  The patient reports that for the last 3 to 4 days his suprapubic catheter has not drained any urine.  He reports that he is draining urine from his penis. He reports that his bag has remained empty.  He states that this is a common problem for him and he is set to have Botox injection with Dr. Alvester Morin of urology on 9/23.  He denies nausea, vomiting, fevers, abdominal pain at home.  He reports he is unsure when he had his suprapubic catheter placed.  He states that when he is able to urinate it burns.  HPI     Home Medications Prior to Admission medications   Medication Sig Start Date End Date Taking? Authorizing Provider  baclofen (LIORESAL) 20 MG tablet Take 1 tablet (20 mg total) by mouth 3 (three) times daily. 01/17/23   Donell Beers, FNP  DULoxetine (CYMBALTA) 30 MG capsule Take 1 capsule (30 mg total) by mouth daily. 01/17/23   Paseda, [place] Kay, FNP  gabapentin (NEURONTIN) 400 MG capsule Take 1 capsule (400 mg total) by mouth 3 (three) times daily. 01/19/23   Paseda, Spellman Kay, FNP  natalizumab (TYSABRI) 300 MG/15ML injection Inject 15 mLs (300 mg total) into the vein every 28 (twenty-eight) days. 09/05/16   Sater, Pearletha Furl, MD  oxybutynin (DITROPAN-XL) 10 MG 24 hr tablet Take 1 tablet (10 mg total) by mouth daily. 01/17/23   Paseda, [place] Kay, FNP  QUEtiapine (SEROQUEL) 200 MG tablet Take 1 tablet (200 mg total) by mouth at bedtime. 01/17/23   Donell Beers, FNP      Allergies    Lithium    Review of Systems   Review of Systems  Physical Exam Updated Vital Signs BP 119/60  (BP Location: Left Arm)   Pulse 64   Temp 98 F (36.7 C) (Oral)   Resp 18   SpO2 97%  Physical Exam  ED Results / Procedures / Treatments   Labs (all labs ordered are listed, but only abnormal results are displayed) Labs Reviewed  CBC - Abnormal; Notable for the following components:      Result Value   WBC 10.7 (*)    nRBC 0.7 (*)    All other components within normal limits  BASIC METABOLIC PANEL - Abnormal; Notable for the following components:   Glucose, Bld 100 (*)    All other components within normal limits  URINALYSIS, ROUTINE W REFLEX MICROSCOPIC    EKG None  Radiology No results found.  Procedures Procedures  {Document cardiac monitor, telemetry assessment procedure when appropriate:1}  Medications Ordered in ED Medications - No data to display  ED Course/ Medical Decision Making/ A&P   {   Click here for ABCD2, HEART and other calculatorsREFRESH Note before signing :1}                              Medical Decision Making Risk Prescription drug management.   ***  {Document critical care time when appropriate:1} {Document review of labs and clinical decision tools ie heart  score, Chads2Vasc2 etc:1}  {Document your independent review of radiology images, and any outside records:1} {Document your discussion with family members, caretakers, and with consultants:1} {Document social determinants of health affecting pt's care:1} {Document your decision making why or why not admission, treatments were needed:1} Final Clinical Impression(s) / ED Diagnoses Final diagnoses:  None    Rx / DC Orders ED Discharge Orders     None

## 2023-03-08 NOTE — ED Triage Notes (Signed)
Patient here from home reporting suprapubic catheter not draining. States that he thinks it is clogged. Hx of MS. States that it is leaking around tip.

## 2023-03-09 DIAGNOSIS — G35 Multiple sclerosis: Secondary | ICD-10-CM | POA: Diagnosis not present

## 2023-03-09 NOTE — Discharge Instructions (Signed)
It was a pleasure taking part in your care today.  As we discussed, it seems as if you are draining urine through your penis.  There was no evidence of urinary retention on ultrasound.  Your creatinine was not elevated so your kidneys are functioning properly.  Please follow-up with urology, Dr. Alvester Morin, tomorrow.  Please begin taking her Keflex medication 4 times a day as opposed to once a day.  Please return to the ED with any new or worsening signs or symptoms.

## 2023-03-12 ENCOUNTER — Encounter (HOSPITAL_COMMUNITY)
Admission: EM | Disposition: A | Discharge status: Home / Self Care | Payer: Self-pay | Source: Home / Self Care | Attending: Emergency Medicine

## 2023-03-12 ENCOUNTER — Encounter (HOSPITAL_COMMUNITY): Payer: Self-pay | Admitting: *Deleted

## 2023-03-12 ENCOUNTER — Ambulatory Visit (HOSPITAL_COMMUNITY)
Admission: EM | Admit: 2023-03-12 | Discharge: 2023-03-12 | Disposition: A | Discharge status: Home / Self Care | Payer: Medicaid Other | Attending: Emergency Medicine | Admitting: Emergency Medicine

## 2023-03-12 ENCOUNTER — Emergency Department (HOSPITAL_COMMUNITY): Payer: Medicaid Other | Admitting: Physician Assistant

## 2023-03-12 ENCOUNTER — Ambulatory Visit (HOSPITAL_COMMUNITY): Admission: RE | Admit: 2023-03-12 | Payer: Medicaid Other | Source: Ambulatory Visit | Admitting: Urology

## 2023-03-12 ENCOUNTER — Other Ambulatory Visit: Payer: Self-pay

## 2023-03-12 ENCOUNTER — Emergency Department (HOSPITAL_BASED_OUTPATIENT_CLINIC_OR_DEPARTMENT_OTHER): Payer: Medicaid Other | Admitting: Anesthesiology

## 2023-03-12 DIAGNOSIS — T83090A Other mechanical complication of cystostomy catheter, initial encounter: Secondary | ICD-10-CM | POA: Insufficient documentation

## 2023-03-12 DIAGNOSIS — F419 Anxiety disorder, unspecified: Secondary | ICD-10-CM | POA: Diagnosis not present

## 2023-03-12 DIAGNOSIS — F17200 Nicotine dependence, unspecified, uncomplicated: Secondary | ICD-10-CM | POA: Insufficient documentation

## 2023-03-12 DIAGNOSIS — F3176 Bipolar disorder, in full remission, most recent episode depressed: Secondary | ICD-10-CM | POA: Diagnosis not present

## 2023-03-12 DIAGNOSIS — Y846 Urinary catheterization as the cause of abnormal reaction of the patient, or of later complication, without mention of misadventure at the time of the procedure: Secondary | ICD-10-CM | POA: Diagnosis not present

## 2023-03-12 DIAGNOSIS — N3281 Overactive bladder: Secondary | ICD-10-CM

## 2023-03-12 DIAGNOSIS — G35 Multiple sclerosis: Secondary | ICD-10-CM | POA: Diagnosis not present

## 2023-03-12 DIAGNOSIS — Z466 Encounter for fitting and adjustment of urinary device: Secondary | ICD-10-CM | POA: Diagnosis not present

## 2023-03-12 DIAGNOSIS — R338 Other retention of urine: Secondary | ICD-10-CM | POA: Diagnosis not present

## 2023-03-12 DIAGNOSIS — N310 Uninhibited neuropathic bladder, not elsewhere classified: Secondary | ICD-10-CM | POA: Insufficient documentation

## 2023-03-12 DIAGNOSIS — N311 Reflex neuropathic bladder, not elsewhere classified: Secondary | ICD-10-CM | POA: Diagnosis not present

## 2023-03-12 DIAGNOSIS — T83098A Other mechanical complication of other indwelling urethral catheter, initial encounter: Secondary | ICD-10-CM | POA: Diagnosis not present

## 2023-03-12 DIAGNOSIS — N319 Neuromuscular dysfunction of bladder, unspecified: Secondary | ICD-10-CM | POA: Diagnosis not present

## 2023-03-12 DIAGNOSIS — F319 Bipolar disorder, unspecified: Secondary | ICD-10-CM | POA: Insufficient documentation

## 2023-03-12 DIAGNOSIS — R140 Abdominal distension (gaseous): Secondary | ICD-10-CM | POA: Diagnosis not present

## 2023-03-12 DIAGNOSIS — Z743 Need for continuous supervision: Secondary | ICD-10-CM | POA: Diagnosis not present

## 2023-03-12 DIAGNOSIS — E7800 Pure hypercholesterolemia, unspecified: Secondary | ICD-10-CM

## 2023-03-12 DIAGNOSIS — R1084 Generalized abdominal pain: Secondary | ICD-10-CM | POA: Diagnosis not present

## 2023-03-12 DIAGNOSIS — R1030 Lower abdominal pain, unspecified: Secondary | ICD-10-CM | POA: Insufficient documentation

## 2023-03-12 HISTORY — PX: INSERTION OF SUPRAPUBIC CATHETER: SHX5870

## 2023-03-12 HISTORY — PX: BOTOX INJECTION: SHX5754

## 2023-03-12 LAB — URINALYSIS, ROUTINE W REFLEX MICROSCOPIC
Bilirubin Urine: NEGATIVE
Glucose, UA: NEGATIVE mg/dL
Ketones, ur: NEGATIVE mg/dL
Nitrite: NEGATIVE
Protein, ur: 100 mg/dL — AB
RBC / HPF: >50 RBC/hpf (ref 0–5)
Specific Gravity, Urine: 1.024 (ref 1.005–1.030)
WBC, UA: >50 WBC/hpf (ref 0–5)
pH: 6 (ref 5.0–8.0)

## 2023-03-12 LAB — COMPREHENSIVE METABOLIC PANEL
ALT: 14 U/L (ref 0–44)
AST: 15 U/L (ref 15–41)
Albumin: 3.4 g/dL — ABNORMAL LOW (ref 3.5–5.0)
Alkaline Phosphatase: 68 U/L (ref 38–126)
Anion gap: 8 (ref 5–15)
BUN: 22 mg/dL — ABNORMAL HIGH (ref 6–20)
CO2: 24 mmol/L (ref 22–32)
Calcium: 8.7 mg/dL — ABNORMAL LOW (ref 8.9–10.3)
Chloride: 106 mmol/L (ref 98–111)
Creatinine, Ser: 0.98 mg/dL (ref 0.61–1.24)
GFR, Estimated: >60 mL/min (ref >60)
Glucose, Bld: 98 mg/dL (ref 70–99)
Potassium: 3.9 mmol/L (ref 3.5–5.1)
Sodium: 138 mmol/L (ref 135–145)
Total Bilirubin: 0.4 mg/dL (ref 0.3–1.2)
Total Protein: 6.3 g/dL — ABNORMAL LOW (ref 6.5–8.1)

## 2023-03-12 LAB — CBC
HCT: 37.6 % — ABNORMAL LOW (ref 39.0–52.0)
Hemoglobin: 12.8 g/dL — ABNORMAL LOW (ref 13.0–17.0)
MCH: 30.9 pg (ref 26.0–34.0)
MCHC: 34 g/dL (ref 30.0–36.0)
MCV: 90.8 fL (ref 80.0–100.0)
Platelets: 197 10*3/uL (ref 150–400)
RBC: 4.14 MIL/uL — ABNORMAL LOW (ref 4.22–5.81)
RDW: 14.6 % (ref 11.5–15.5)
WBC: 8.3 10*3/uL (ref 4.0–10.5)
nRBC: 0.5 % — ABNORMAL HIGH (ref 0.0–0.2)

## 2023-03-12 LAB — LIPASE, BLOOD: Lipase: 26 U/L (ref 11–51)

## 2023-03-12 SURGERY — BOTOX INJECTION
Anesthesia: General

## 2023-03-12 MED ORDER — PROPOFOL 10 MG/ML IV BOLUS
INTRAVENOUS | Status: AC
  Filled 2023-03-12: qty 20

## 2023-03-12 MED ORDER — LACTATED RINGERS IV SOLN: INTRAVENOUS | Status: DC

## 2023-03-12 MED ORDER — PROPOFOL 10 MG/ML IV BOLUS
INTRAVENOUS | Status: DC | PRN
Start: 2023-03-12 — End: 2023-03-12
  Administered 2023-03-12: 20 mg via INTRAVENOUS

## 2023-03-12 MED ORDER — MIDAZOLAM HCL 2 MG/2ML IJ SOLN
INTRAMUSCULAR | Status: DC | PRN
Start: 2023-03-12 — End: 2023-03-12
  Administered 2023-03-12 (×2): 1 mg via INTRAVENOUS

## 2023-03-12 MED ORDER — ONABOTULINUMTOXINA 100 UNITS IJ SOLR
INTRAMUSCULAR | Status: AC
  Filled 2023-03-12: qty 200

## 2023-03-12 MED ORDER — DEXAMETHASONE SODIUM PHOSPHATE 10 MG/ML IJ SOLN
INTRAMUSCULAR | Status: AC
  Filled 2023-03-12: qty 1

## 2023-03-12 MED ORDER — MIDAZOLAM HCL 2 MG/2ML IJ SOLN
INTRAMUSCULAR | Status: AC
  Filled 2023-03-12: qty 2

## 2023-03-12 MED ORDER — ONABOTULINUMTOXINA 100 UNITS IJ SOLR
INTRAMUSCULAR | Status: DC | PRN
  Administered 2023-03-12 (×2): 100 [IU] via INTRAMUSCULAR

## 2023-03-12 MED ORDER — ONDANSETRON HCL 4 MG/2ML IJ SOLN
INTRAMUSCULAR | Status: AC
  Filled 2023-03-12: qty 2

## 2023-03-12 MED ORDER — STERILE WATER FOR IRRIGATION IR SOLN
Status: DC | PRN
Start: 2023-03-12 — End: 2023-03-12
  Administered 2023-03-12: 3000 mL

## 2023-03-12 MED ORDER — CHLORHEXIDINE GLUCONATE 0.12 % MT SOLN
15.0000 mL | Freq: Once | OROMUCOSAL | Status: AC
  Administered 2023-03-12: 15 mL via OROMUCOSAL
  Filled 2023-03-12: qty 15

## 2023-03-12 MED ORDER — LIDOCAINE HCL (PF) 2 % IJ SOLN
INTRAMUSCULAR | Status: AC
  Filled 2023-03-12: qty 5

## 2023-03-12 MED ORDER — ONDANSETRON HCL 4 MG/2ML IJ SOLN
INTRAMUSCULAR | Status: DC | PRN
Start: 2023-03-12 — End: 2023-03-12
  Administered 2023-03-12: 4 mg via INTRAVENOUS

## 2023-03-12 MED ORDER — STERILE WATER FOR IRRIGATION IR SOLN
Status: DC | PRN
  Administered 2023-03-12: 500 mL

## 2023-03-12 MED ORDER — ORAL CARE MOUTH RINSE: 15.0000 mL | Freq: Once | OROMUCOSAL | Status: AC

## 2023-03-12 MED ORDER — CEFAZOLIN SODIUM-DEXTROSE 2-4 GM/100ML-% IV SOLN
2.0000 g | INTRAVENOUS | Status: AC
  Administered 2023-03-12: 2 g via INTRAVENOUS
  Filled 2023-03-12 (×2): qty 100

## 2023-03-12 MED ORDER — SODIUM CHLORIDE (PF) 0.9 % IJ SOLN
INTRAMUSCULAR | Status: AC
  Filled 2023-03-12: qty 20

## 2023-03-12 SURGICAL SUPPLY — 34 items
BAG COUNTER SPONGE SURGICOUNT (BAG) IMPLANT
BAG DRN RND TRDRP ANRFLXCHMBR (UROLOGICAL SUPPLIES)
BAG SPNG CNTER NS LX DISP (BAG)
BAG URINE DRAIN 2000ML AR STRL (UROLOGICAL SUPPLIES) ×1 IMPLANT
BAG URINE LEG 500ML (DRAIN) ×1 IMPLANT
BAG URO CATCHER STRL LF (MISCELLANEOUS) ×1 IMPLANT
BLADE SURG 15 STRL LF DISP TIS (BLADE) ×1 IMPLANT
BLADE SURG 15 STRL SS (BLADE) ×1
CATH FOLEY 2WAY SLVR 5CC 18FR (CATHETERS) IMPLANT
CATH FOLEY 2WAY SLVR 5CC 20FR (CATHETERS) ×1 IMPLANT
CATH URETL OPEN 5X70 (CATHETERS) IMPLANT
CLOTH BEACON ORANGE TIMEOUT ST (SAFETY) ×1 IMPLANT
ELECT REM PT RETURN 15FT ADLT (MISCELLANEOUS) ×1 IMPLANT
GLOVE BIO SURGEON STRL SZ7.5 (GLOVE) ×1 IMPLANT
GLOVE SURG LX STRL 7.5 STRW (GLOVE) ×1 IMPLANT
GLOVE SURG SS PI 8.0 STRL IVOR (GLOVE) ×1 IMPLANT
GOWN STRL REUS W/ TWL XL LVL3 (GOWN DISPOSABLE) ×2 IMPLANT
GOWN STRL REUS W/TWL XL LVL3 (GOWN DISPOSABLE) ×2
KIT TURNOVER KIT A (KITS) IMPLANT
MANIFOLD NEPTUNE II (INSTRUMENTS) ×1 IMPLANT
NDL ASPIRATION 22 (NEEDLE) ×1 IMPLANT
NDL HYPO 22X1.5 SAFETY MO (MISCELLANEOUS) ×1 IMPLANT
NDL SAFETY ECLIP 18X1.5 (MISCELLANEOUS) IMPLANT
NEEDLE ASPIRATION 22 (NEEDLE) ×1
NEEDLE HYPO 22X1.5 SAFETY MO (MISCELLANEOUS) ×1
PACK CYSTO (CUSTOM PROCEDURE TRAY) ×1 IMPLANT
PENCIL SMOKE EVACUATOR (MISCELLANEOUS) IMPLANT
PLUG CATH AND CAP STRL 200 (CATHETERS) ×1 IMPLANT
SPONGE DRAIN TRACH 4X4 STRL 2S (GAUZE/BANDAGES/DRESSINGS) ×1 IMPLANT
SUT ETHILON 2 0 PS N (SUTURE) ×1 IMPLANT
SYR CONTROL 10ML LL (SYRINGE) IMPLANT
TOWEL OR 17X26 10 PK STRL BLUE (TOWEL DISPOSABLE) ×1 IMPLANT
TUBING CONNECTING 10 (TUBING) IMPLANT
WATER STERILE IRR 3000ML UROMA (IV SOLUTION) ×1 IMPLANT

## 2023-03-12 NOTE — Transfer of Care (Signed)
Immediate Anesthesia Transfer of Care Note  Patient: Bradley Brandt  Procedure(s) Performed: BOTOX INJECTION SUPRAPUBIC CATHETER EXCHANGE  Patient Location: PACU  Anesthesia Type:MAC  Level of Consciousness: awake  Airway & Oxygen Therapy: Patient Spontanous Breathing  Post-op Assessment: Report given to RN and Post -op Vital signs reviewed and stable  Post vital signs: Reviewed and stable  Last Vitals:  Vitals Value Taken Time  BP 137/85 03/12/23 1030  Temp    Pulse 50 03/12/23 1034  Resp 14 03/12/23 1034  SpO2 99 % 03/12/23 1034  Vitals shown include unfiled device data.  Last Pain:  Vitals:   03/12/23 0652  TempSrc: Oral  PainSc:          Complications: No notable events documented.

## 2023-03-12 NOTE — H&P (Signed)
CC/HPI: 02/09/2023: Bradley Brandt returns. His suprapubic catheter was exchanged earlier this week. He is on Bactrim for last positive urine culture in the ED growing Enterobacter. Also on oxybutynin 10 mg ER. He is complaining of site irritation with some mild bloody discharge. On exam there looks like an hyperproliferative ration of granulation tissue. Also now complaining of bladder spasms and some penile pain and burning. The catheter appears to be draining well otherwise despite the malfunctioning bag. Also has pending Botox, scheduling sheet submitted earlier this week.     ALLERGIES: Lithium    MEDICATIONS: Oxybutynin Chloride Er 10 mg tablet, extended release 24 hr 1 tablet PO BID  Phenazopyridine Hcl 200 mg tablet  Acetaminophen  Baclofen 20 mg tablet  Duloxetine Hcl 30 mg capsule,delayed release  Gabapentin 400 mg capsule  Lidocaine 4 % cream  Melatonin 3 mg tablet  Oxycodone Hcl 5 mg tablet  Quetiapine Fumarate 200 mg tablet  Sulfamethoxazole-Trimethoprim 800 mg-160 mg tablet  Vitamin D2 1,250 mcg (50,000 unit) capsule     GU PSH: Complex cystometrogram, w/ void pressure and urethral pressure profile studies, any technique - 10/19/2022 Complex Uroflow - 10/19/2022 Emg surf Electrd - 10/19/2022 Inject For cystogram - 10/19/2022 Intrabd voidng Press - 10/19/2022 Simple Change SP Tube - 02/06/2023     NON-GU PSH: Stomach surgery (unspecified) Visit Complexity (formerly GPC1X) - 11/16/2022     GU PMH: 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    REVIEW OF SYSTEMS:     GU Review Male:   Patient reports burning/ pain with urination and penile pain. Patient denies frequent urination, hard to postpone urination, get up at night to urinate, leakage of urine, stream starts and stops, trouble starting your stream, have to strain to urinate , and erection problems.  Gastrointestinal (Upper):   Patient denies indigestion/ heartburn, nausea, and vomiting.  Gastrointestinal (Lower):   Patient denies diarrhea and constipation.  Constitutional:   Patient denies fever, night sweats, weight loss, and fatigue.  Skin:   Patient denies skin rash/ lesion and itching.  Eyes:   Patient denies blurred vision and double vision.  Ears/ Nose/ Throat:   Patient denies sore throat and sinus problems.  Hematologic/Lymphatic:   Patient denies swollen glands and easy bruising.  Cardiovascular:   Patient denies leg swelling and chest pains.  Respiratory:   Patient denies cough and shortness of breath.  Endocrine:   Patient denies excessive thirst.  Musculoskeletal:   Patient denies back pain and joint pain.  Neurological:   Patient denies headaches and dizziness.  Psychologic:   Patient denies depression and anxiety.   VITAL SIGNS: None   MULTI-SYSTEM PHYSICAL EXAMINATION:    Constitutional: Well-nourished. No physical deformities. Normally developed. Good grooming. Pt in wheelchair.  Neck: Neck symmetrical, not swollen. Normal tracheal position.  Respiratory: No labored breathing, no use of accessory muscles.   Neurologic / Psychiatric: Oriented to time, oriented to place, oriented to person. No depression, no anxiety, no agitation.  Gastrointestinal: No mass, no tenderness, no rigidity, non obese abdomen. S/p tube draining grossly clear-yellow urine. His bag has malfunctioned. There is granulation tissue (  moderate) surrounding the catheter insertion site, otherwise unremarkable.  Musculoskeletal: Normal gait and station of head and neck.     Complexity of Data:  Source Of  History:  Patient, Medical Record Summary  Records Review:   Previous Doctor Records, Previous Hospital Records, Previous Patient Records  Urine Test Review:   Urine Culture   PROCEDURES:          Cath Irrigation - 51700 His suprapubic tube irrigated well with approximately 200 cc of sterile water. There was no resistance, debris or blood products irrigated from the bladder.   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           Document Letter(s):  Created for Patient: Clinical Summary         Notes:   Catheter irrigated well today. He's still having bladder spasms. Botox pending. He will complete previously prescribed culture sensitive antibiotics. I will have him increase his ER oxybutynin to twice daily with plans to likely discontinue or reduce dose once Botox occurs. He understands the need to remain very well-hydrated as good urine output will certainly help maintain patency of the catheter. He has follow-up on the fifth for catheter exchange and will keep that appointment. We could consider doing some silver nitrate treatment to the granulation tissue surrounding the suprapubic tract but patient told me he was not ready to have that done today.        Next Appointment:      Next Appointment: 02/22/2023 11:00 AM    Appointment Type: Nurse Visit NO Urine    Location: [facility] [phone]    Provider: NVALL NVALL    Reason for Visit: Next Available  Appointment - Nurse Visit-No Urine, Simple Change SP Tube      Signed by Anne Fu, NP on 02/09/23 at 9:18 AM (EDT

## 2023-03-12 NOTE — Op Note (Signed)
Operative Note  Preoperative diagnosis:  1.  Neurogenic bladder  Postoperative diagnosis: 1.  Neurogenic bladder  Procedure(s): 1.  Cystoscopy with 200 units of intra detrusor Botox 2.  Suprapubic catheter exchange  Surgeon: Modena Slater, MD  Assistants: None  Anesthesia: General  Complications: None immediate  EBL: Minimal  Specimens: 1.  None  Drains/Catheters: 1.  18 French suprapubic catheter  Intraoperative findings: 1.  Normal anterior urethra 2.  Nonobstructing prostate 3.  Bladder mucosa with diffuse catheter edema but no obvious tumors or masses.  Indication: 42 year old male with neurogenic bladder managed with suprapubic tube having intermittent bladder spasms presents for intra detrusor Botox.  Description of procedure:  The patient was identified and consent was obtained.  The patient was taken to the operating room and placed in the supine position.  The patient was placed under MAC anesthesia.  Perioperative antibiotics were administered.  The patient was placed in dorsal lithotomy.  Patient was prepped and draped in a standard sterile fashion and a timeout was performed.  A rigid cystoscope was advanced into the urethra and into the bladder.  Complete cystoscopy was performed with findings noted above.  200 units of Botox was injected into the detrusor systematically throughout the bladder.  There is no significant active bleeding.  I placed an 36 French suprapubic tube.  I remove the scope and this concluded the operation.  Patient tolerated the procedure well was stable postoperatively.  Plan: Follow-up in 6 months for repeat Botox

## 2023-03-12 NOTE — ED Provider Notes (Signed)
[place] EMERGENCY DEPARTMENT AT [facility] Provider Note   CSN: [id number] Arrival date & time: 03/12/23  4098     History  Chief Complaint  Patient presents with   Abdominal Pain    Bradley Brandt is a 42 y.o. male.  This is a 42 year old male here today for pain around suprapubic catheter site.  He is scheduled to have his suprapubic catheter swapped out by urology this morning.  He is also scheduled for the Botox injection around the site for pain.   Abdominal Pain      Home Medications Prior to Admission medications   Medication Sig Start Date End Date Taking? Authorizing Provider  baclofen (LIORESAL) 20 MG tablet Take 1 tablet (20 mg total) by mouth 3 (three) times daily. 01/17/23   Donell Beers, FNP  DULoxetine (CYMBALTA) 30 MG capsule Take 1 capsule (30 mg total) by mouth daily. 01/17/23   Paseda, Silguero Kay, FNP  gabapentin (NEURONTIN) 400 MG capsule Take 1 capsule (400 mg total) by mouth 3 (three) times daily. 01/19/23   Paseda, Olinde Kay, FNP  natalizumab (TYSABRI) 300 MG/15ML injection Inject 15 mLs (300 mg total) into the vein every 28 (twenty-eight) days. 09/05/16   Sater, Pearletha Furl, MD  oxybutynin (DITROPAN-XL) 10 MG 24 hr tablet Take 1 tablet (10 mg total) by mouth daily. 01/17/23   Paseda, Lucado Kay, FNP  QUEtiapine (SEROQUEL) 200 MG tablet Take 1 tablet (200 mg total) by mouth at bedtime. 01/17/23   Donell Beers, FNP      Allergies    Lithium    Review of Systems   Review of Systems  Gastrointestinal:  Positive for abdominal pain.    Physical Exam Updated Vital Signs BP (!) 131/59 (BP Location: Left Arm)   Pulse 60   Temp 97.8 F (36.6 C) (Oral)   Resp 18   Ht 5\' 11"  (1.803 m)   Wt 72.6 kg   SpO2 100%   BMI 22.32 kg/m  Physical Exam Vitals reviewed.  Abdominal:     Palpations: Abdomen is soft.     Tenderness: There is no abdominal tenderness.     Comments: Suprapubic catheter in place.  No pus, purulence.   Genitourinary:    Penis: Normal.   Skin:    General: Skin is warm and dry.  Neurological:     Mental Status: He is alert.     ED Results / Procedures / Treatments   Labs (all labs ordered are listed, but only abnormal results are displayed) Labs Reviewed  COMPREHENSIVE METABOLIC PANEL - Abnormal; Notable for the following components:      Result Value   BUN 22 (*)    Calcium 8.7 (*)    Total Protein 6.3 (*)    Albumin 3.4 (*)    All other components within normal limits  CBC - Abnormal; Notable for the following components:   RBC 4.14 (*)    Hemoglobin 12.8 (*)    HCT 37.6 (*)    nRBC 0.5 (*)    All other components within normal limits  URINALYSIS, ROUTINE W REFLEX MICROSCOPIC - Abnormal; Notable for the following components:   APPearance CLOUDY (*)    Hgb urine dipstick MODERATE (*)    Protein, ur 100 (*)    Leukocytes,Ua LARGE (*)    Bacteria, UA RARE (*)    All other components within normal limits  LIPASE, BLOOD    EKG None  Radiology No results found.  Procedures Procedures  Medications Ordered in ED Medications  ceFAZolin (ANCEF) IVPB 2g/100 mL premix (has no administration in time range)  lactated ringers infusion (has no administration in time range)  chlorhexidine (PERIDEX) 0.12 % solution 15 mL (has no administration in time range)    Or  Oral care mouth rinse (has no administration in time range)    ED Course/ Medical Decision Making/ A&P                                 Medical Decision Making 42 year old male here today with dark urine and pain around the suprapubic catheter site.  Plan -patient appears to have just come to the wrong location for his suprapubic catheter swap out.  Reached out to the patient's urologist, Dr. Francoise Schaumann.  They will send for the patient at 830.  Basic labs ordered.  Amount and/or Complexity of Data Reviewed Labs: ordered.           Final Clinical Impression(s) / ED Diagnoses Final diagnoses:   Lower abdominal pain    Rx / DC Orders ED Discharge Orders     None         Anders Simmonds T, DO 03/12/23 [phone]

## 2023-03-12 NOTE — ED Triage Notes (Signed)
Pt arrives via [facility] from home, with c/o abdominal pain. Pt has foley catheter in place. Rigidity and rebound tenderness. Hx of MS. Pain started several days ago. En route vitals hr 56, rr 16, 140/82, 93% ra.

## 2023-03-12 NOTE — Anesthesia Preprocedure Evaluation (Addendum)
Anesthesia Evaluation  Patient identified by MRN, date of birth, ID band  Reviewed: Allergy & Precautions, NPO status , Patient's Chart, lab work & pertinent test results  Airway Mallampati: III  TM Distance: >3 FB Neck ROM: Full    Dental  (+) Dental Advisory Given, Teeth Intact   Pulmonary pneumonia, Current Smoker and Patient abstained from smoking.   Pulmonary exam normal breath sounds clear to auscultation       Cardiovascular negative cardio ROS  Rhythm:Regular Rate:Normal     Neuro/Psych  PSYCHIATRIC DISORDERS Anxiety  Bipolar Disorder    Neuromuscular disease (MS)    GI/Hepatic negative GI ROS, Neg liver ROS,,,  Endo/Other  negative endocrine ROS    Renal/GU negative Renal ROS     Musculoskeletal negative musculoskeletal ROS (+)    Abdominal   Peds  Hematology negative hematology ROS (+)   Anesthesia Other Findings   Reproductive/Obstetrics                             Anesthesia Physical Anesthesia Plan  ASA: 3  Anesthesia Plan: MAC   Post-op Pain Management: Ofirmev IV (intra-op)*   Induction: Intravenous  PONV Risk Score and Plan: 0 and Ondansetron, Dexamethasone, Treatment may vary due to age or medical condition, Propofol infusion and TIVA  Airway Management Planned: Natural Airway  Additional Equipment:   Intra-op Plan:   Post-operative Plan:   Informed Consent: I have reviewed the patients History and Physical, chart, labs and discussed the procedure including the risks, benefits and alternatives for the proposed anesthesia with the patient or authorized representative who has indicated his/her understanding and acceptance.     Dental advisory given  Plan Discussed with: CRNA  Anesthesia Plan Comments:        Anesthesia Quick Evaluation

## 2023-03-12 NOTE — ED Triage Notes (Signed)
Pt reporting pain around his suprapubic catheter, "for a couple of weeks". For about 2 days urine in catheter bag has been darker than normal. Denies fevers

## 2023-03-13 ENCOUNTER — Other Ambulatory Visit (HOSPITAL_COMMUNITY): Payer: Self-pay

## 2023-03-13 ENCOUNTER — Encounter (HOSPITAL_COMMUNITY): Payer: Self-pay | Admitting: Urology

## 2023-03-13 ENCOUNTER — Telehealth: Payer: Self-pay | Admitting: Pharmacy Technician

## 2023-03-13 ENCOUNTER — Telehealth: Payer: Self-pay | Admitting: Neurology

## 2023-03-13 DIAGNOSIS — G35 Multiple sclerosis: Secondary | ICD-10-CM | POA: Diagnosis not present

## 2023-03-13 NOTE — Telephone Encounter (Addendum)
NOTE: PHARMACY BENEFIT  Auth Submission: APPROVED Site of care: Site of care: [facility] Payer: [employer] MEDICAID Medication & CPT/J Code(s) submitted: Tysabri (Natalizumab) [id number] Route of submission (phone, fax, portal):  Phone # [phone] Fax # [phone] Auth type: Pharmacy Benefit Units/visits requested: 300MG  Q4WKS Reference number: [id number] Approval from: 03/13/23 - 03/12/24   @Yatin , fyi flag and excell sheet updated.

## 2023-03-13 NOTE — Telephone Encounter (Signed)
Pt's father, Draven Degood received a call from [facility] informed  Dr. Epimenio Foot need to re-enroll pt in Tysabri program. The company that send the medication will not send unless they get another order.  Every 6 months pt has been re-enroll in the program. Would like a call from the nurse.  Manufacture of the medication pnone: [phone]  [facility]: [phone]

## 2023-03-13 NOTE — Anesthesia Postprocedure Evaluation (Signed)
Anesthesia Post Note  Patient: Bradley Brandt  Procedure(s) Performed: BOTOX INJECTION SUPRAPUBIC CATHETER EXCHANGE     Patient location during evaluation: PACU Anesthesia Type: General Level of consciousness: sedated and patient cooperative Pain management: pain level controlled Vital Signs Assessment: post-procedure vital signs reviewed and stable Respiratory status: spontaneous breathing Cardiovascular status: stable Anesthetic complications: no   No notable events documented.  Last Vitals:  Vitals:   03/12/23 1145 03/12/23 1154  BP: 118/72 117/66  Pulse: (!) 45 (!) 50  Resp: 12   Temp: (!) 36.2 C   SpO2: 98% 100%    Last Pain:  Vitals:   03/12/23 1154  TempSrc:   PainSc: 0-No pain                 Lewie Loron

## 2023-03-13 NOTE — Telephone Encounter (Signed)
I called and spoke to Rosa Sanchez with [facility] [phone].  Pt is due for his infusion 03/30/2023 She said pt was authorized for tysabri until 08-24-2023.  She connected with [facility] and needs a PA [phone] opt 1 then opt 4.  Or direct line [phone].  I relayed that I appreciated her help.

## 2023-03-14 DIAGNOSIS — G35 Multiple sclerosis: Secondary | ICD-10-CM | POA: Diagnosis not present

## 2023-03-14 NOTE — Telephone Encounter (Signed)
I called and spoke to [name] , [facility] she said they do PA.  They will reach out if needed but nothing needs to be done on our part at this time.  I relayed message to father in VM of above.

## 2023-03-15 DIAGNOSIS — G35 Multiple sclerosis: Secondary | ICD-10-CM | POA: Diagnosis not present

## 2023-03-16 ENCOUNTER — Other Ambulatory Visit (HOSPITAL_COMMUNITY): Payer: Self-pay

## 2023-03-16 ENCOUNTER — Other Ambulatory Visit: Payer: Self-pay

## 2023-03-16 ENCOUNTER — Encounter: Payer: Self-pay | Admitting: Neurology

## 2023-03-16 DIAGNOSIS — G35 Multiple sclerosis: Secondary | ICD-10-CM | POA: Diagnosis not present

## 2023-03-16 MED ORDER — SODIUM CHLORIDE 0.9 % IV SOLN
300.0000 mg | INTRAVENOUS | 12 refills | Status: DC
Start: 2023-03-16 — End: 2023-04-04

## 2023-03-17 DIAGNOSIS — G35 Multiple sclerosis: Secondary | ICD-10-CM | POA: Diagnosis not present

## 2023-03-18 DIAGNOSIS — G35 Multiple sclerosis: Secondary | ICD-10-CM | POA: Diagnosis not present

## 2023-03-19 ENCOUNTER — Telehealth: Payer: Self-pay | Admitting: *Deleted

## 2023-03-19 ENCOUNTER — Other Ambulatory Visit: Payer: Self-pay

## 2023-03-19 DIAGNOSIS — G35 Multiple sclerosis: Secondary | ICD-10-CM | POA: Diagnosis not present

## 2023-03-19 NOTE — Telephone Encounter (Signed)
[employer] request for prio approval CMN/PA faxed to [phone]. Confirmation received.

## 2023-03-20 ENCOUNTER — Telehealth: Payer: Self-pay | Admitting: *Deleted

## 2023-03-20 ENCOUNTER — Other Ambulatory Visit: Payer: Self-pay

## 2023-03-20 ENCOUNTER — Other Ambulatory Visit (HOSPITAL_COMMUNITY): Payer: Self-pay | Admitting: Pharmacy Technician

## 2023-03-20 DIAGNOSIS — G35 Multiple sclerosis: Secondary | ICD-10-CM

## 2023-03-20 MED ORDER — TYSABRI 300 MG/15ML IV CONC
300.0000 mg | INTRAVENOUS | 11 refills | Status: DC
Start: 2023-03-20 — End: 2024-01-17
  Filled 2023-03-20 – 2023-03-30 (×2): qty 15, 28d supply, fill #0

## 2023-03-20 NOTE — Telephone Encounter (Signed)
  Faxed to [employer] [phone] confirmation received

## 2023-03-21 DIAGNOSIS — G35 Multiple sclerosis: Secondary | ICD-10-CM | POA: Diagnosis not present

## 2023-03-22 ENCOUNTER — Other Ambulatory Visit: Payer: Self-pay

## 2023-03-22 DIAGNOSIS — G35 Multiple sclerosis: Secondary | ICD-10-CM | POA: Diagnosis not present

## 2023-03-23 DIAGNOSIS — G35 Multiple sclerosis: Secondary | ICD-10-CM | POA: Diagnosis not present

## 2023-03-25 DIAGNOSIS — G35 Multiple sclerosis: Secondary | ICD-10-CM | POA: Diagnosis not present

## 2023-03-26 ENCOUNTER — Other Ambulatory Visit: Payer: Self-pay

## 2023-03-26 ENCOUNTER — Other Ambulatory Visit (HOSPITAL_COMMUNITY): Payer: Self-pay

## 2023-03-26 DIAGNOSIS — G35 Multiple sclerosis: Secondary | ICD-10-CM | POA: Diagnosis not present

## 2023-03-26 DIAGNOSIS — M6281 Muscle weakness (generalized): Secondary | ICD-10-CM | POA: Diagnosis not present

## 2023-03-26 DIAGNOSIS — M6282 Rhabdomyolysis: Secondary | ICD-10-CM | POA: Diagnosis not present

## 2023-03-27 ENCOUNTER — Encounter: Payer: Self-pay | Admitting: Neurology

## 2023-03-27 DIAGNOSIS — G35 Multiple sclerosis: Secondary | ICD-10-CM | POA: Diagnosis not present

## 2023-03-28 ENCOUNTER — Ambulatory Visit: Payer: Medicaid Other

## 2023-03-28 ENCOUNTER — Encounter: Payer: Self-pay | Admitting: Neurology

## 2023-03-28 DIAGNOSIS — G35 Multiple sclerosis: Secondary | ICD-10-CM

## 2023-03-29 ENCOUNTER — Encounter: Payer: Self-pay | Admitting: Neurology

## 2023-03-29 ENCOUNTER — Other Ambulatory Visit: Payer: Self-pay

## 2023-03-29 DIAGNOSIS — G35 Multiple sclerosis: Secondary | ICD-10-CM | POA: Diagnosis not present

## 2023-03-30 ENCOUNTER — Other Ambulatory Visit: Payer: Self-pay

## 2023-03-30 DIAGNOSIS — G35 Multiple sclerosis: Secondary | ICD-10-CM | POA: Diagnosis not present

## 2023-03-30 NOTE — Progress Notes (Signed)
Patient has been getting at [facility]. They have already processed prescription and will send to infusion clinic on [address]. Pt aware of this and wants to continue using [facility] since he gets all of his medications from them. Dis-enrolling

## 2023-03-31 DIAGNOSIS — G35 Multiple sclerosis: Secondary | ICD-10-CM | POA: Diagnosis not present

## 2023-04-01 DIAGNOSIS — G35 Multiple sclerosis: Secondary | ICD-10-CM | POA: Diagnosis not present

## 2023-04-02 ENCOUNTER — Encounter: Payer: Self-pay | Admitting: Neurology

## 2023-04-02 DIAGNOSIS — R269 Unspecified abnormalities of gait and mobility: Secondary | ICD-10-CM | POA: Diagnosis not present

## 2023-04-02 DIAGNOSIS — G35 Multiple sclerosis: Secondary | ICD-10-CM | POA: Diagnosis not present

## 2023-04-03 ENCOUNTER — Other Ambulatory Visit: Payer: Self-pay

## 2023-04-03 ENCOUNTER — Encounter (HOSPITAL_COMMUNITY): Payer: Self-pay

## 2023-04-03 ENCOUNTER — Emergency Department (HOSPITAL_COMMUNITY): Payer: Medicaid Other

## 2023-04-03 ENCOUNTER — Inpatient Hospital Stay (HOSPITAL_COMMUNITY)
Admission: EM | Admit: 2023-04-03 | Discharge: 2023-04-11 | DRG: 698 | Disposition: A | Discharge status: Home / Self Care | Payer: Medicaid Other | Attending: Internal Medicine | Admitting: Internal Medicine

## 2023-04-03 DIAGNOSIS — Z7401 Bed confinement status: Secondary | ICD-10-CM | POA: Diagnosis not present

## 2023-04-03 DIAGNOSIS — N319 Neuromuscular dysfunction of bladder, unspecified: Secondary | ICD-10-CM | POA: Diagnosis present

## 2023-04-03 DIAGNOSIS — R1111 Vomiting without nausea: Principal | ICD-10-CM

## 2023-04-03 DIAGNOSIS — Z8269 Family history of other diseases of the musculoskeletal system and connective tissue: Secondary | ICD-10-CM | POA: Diagnosis not present

## 2023-04-03 DIAGNOSIS — F411 Generalized anxiety disorder: Secondary | ICD-10-CM

## 2023-04-03 DIAGNOSIS — D649 Anemia, unspecified: Secondary | ICD-10-CM | POA: Diagnosis present

## 2023-04-03 DIAGNOSIS — R7989 Other specified abnormal findings of blood chemistry: Secondary | ICD-10-CM | POA: Diagnosis present

## 2023-04-03 DIAGNOSIS — F17210 Nicotine dependence, cigarettes, uncomplicated: Secondary | ICD-10-CM | POA: Diagnosis present

## 2023-04-03 DIAGNOSIS — Y846 Urinary catheterization as the cause of abnormal reaction of the patient, or of later complication, without mention of misadventure at the time of the procedure: Secondary | ICD-10-CM | POA: Diagnosis present

## 2023-04-03 DIAGNOSIS — F319 Bipolar disorder, unspecified: Secondary | ICD-10-CM | POA: Diagnosis present

## 2023-04-03 DIAGNOSIS — Z79899 Other long term (current) drug therapy: Secondary | ICD-10-CM

## 2023-04-03 DIAGNOSIS — A4151 Sepsis due to Escherichia coli [E. coli]: Secondary | ICD-10-CM | POA: Diagnosis present

## 2023-04-03 DIAGNOSIS — T83511A Infection and inflammatory reaction due to indwelling urethral catheter, initial encounter: Secondary | ICD-10-CM | POA: Diagnosis present

## 2023-04-03 DIAGNOSIS — B952 Enterococcus as the cause of diseases classified elsewhere: Secondary | ICD-10-CM | POA: Diagnosis present

## 2023-04-03 DIAGNOSIS — N12 Tubulo-interstitial nephritis, not specified as acute or chronic: Secondary | ICD-10-CM

## 2023-04-03 DIAGNOSIS — E8809 Other disorders of plasma-protein metabolism, not elsewhere classified: Secondary | ICD-10-CM | POA: Diagnosis present

## 2023-04-03 DIAGNOSIS — R6520 Severe sepsis without septic shock: Secondary | ICD-10-CM | POA: Diagnosis present

## 2023-04-03 DIAGNOSIS — G35 Multiple sclerosis: Secondary | ICD-10-CM | POA: Diagnosis not present

## 2023-04-03 DIAGNOSIS — E440 Moderate protein-calorie malnutrition: Secondary | ICD-10-CM | POA: Insufficient documentation

## 2023-04-03 DIAGNOSIS — A419 Sepsis, unspecified organism: Secondary | ICD-10-CM

## 2023-04-03 DIAGNOSIS — N179 Acute kidney failure, unspecified: Secondary | ICD-10-CM | POA: Diagnosis not present

## 2023-04-03 DIAGNOSIS — G35A Relapsing-remitting multiple sclerosis: Secondary | ICD-10-CM | POA: Diagnosis present

## 2023-04-03 DIAGNOSIS — N10 Acute tubulo-interstitial nephritis: Secondary | ICD-10-CM | POA: Diagnosis not present

## 2023-04-03 LAB — CBC WITH DIFFERENTIAL/PLATELET
Abs Immature Granulocytes: 0.22 10*3/uL — ABNORMAL HIGH (ref 0.00–0.07)
Basophils Absolute: 0.1 10*3/uL (ref 0.0–0.1)
Basophils Relative: 0 %
Eosinophils Absolute: 0 10*3/uL (ref 0.0–0.5)
Eosinophils Relative: 0 %
HCT: 32.5 % — ABNORMAL LOW (ref 39.0–52.0)
Hemoglobin: 10.8 g/dL — ABNORMAL LOW (ref 13.0–17.0)
Immature Granulocytes: 1 %
Lymphocytes Relative: 9 %
Lymphs Abs: 2.5 10*3/uL (ref 0.7–4.0)
MCH: 31.2 pg (ref 26.0–34.0)
MCHC: 33.2 g/dL (ref 30.0–36.0)
MCV: 93.9 fL (ref 80.0–100.0)
Monocytes Absolute: 2.1 10*3/uL — ABNORMAL HIGH (ref 0.1–1.0)
Monocytes Relative: 7 %
Neutro Abs: 24.1 10*3/uL — ABNORMAL HIGH (ref 1.7–7.7)
Neutrophils Relative %: 83 %
Platelets: 378 10*3/uL (ref 150–400)
RBC: 3.46 MIL/uL — ABNORMAL LOW (ref 4.22–5.81)
RDW: 13.8 % (ref 11.5–15.5)
WBC: 28.9 10*3/uL — ABNORMAL HIGH (ref 4.0–10.5)
nRBC: 0 % (ref 0.0–0.2)

## 2023-04-03 LAB — COMPREHENSIVE METABOLIC PANEL
ALT: 49 U/L — ABNORMAL HIGH (ref 0–44)
AST: 54 U/L — ABNORMAL HIGH (ref 15–41)
Albumin: 2.5 g/dL — ABNORMAL LOW (ref 3.5–5.0)
Alkaline Phosphatase: 93 U/L (ref 38–126)
Anion gap: 13 (ref 5–15)
BUN: 27 mg/dL — ABNORMAL HIGH (ref 6–20)
CO2: 23 mmol/L (ref 22–32)
Calcium: 8.3 mg/dL — ABNORMAL LOW (ref 8.9–10.3)
Chloride: 101 mmol/L (ref 98–111)
Creatinine, Ser: 1.76 mg/dL — ABNORMAL HIGH (ref 0.61–1.24)
GFR, Estimated: 48 mL/min — ABNORMAL LOW (ref >60)
Glucose, Bld: 125 mg/dL — ABNORMAL HIGH (ref 70–99)
Potassium: 4.8 mmol/L (ref 3.5–5.1)
Sodium: 137 mmol/L (ref 135–145)
Total Bilirubin: 1 mg/dL (ref 0.3–1.2)
Total Protein: 7.8 g/dL (ref 6.5–8.1)

## 2023-04-03 LAB — URINALYSIS, ROUTINE W REFLEX MICROSCOPIC
Bilirubin Urine: NEGATIVE
Glucose, UA: NEGATIVE mg/dL
Ketones, ur: NEGATIVE mg/dL
Nitrite: NEGATIVE
Protein, ur: 100 mg/dL — AB
Specific Gravity, Urine: 1.011 (ref 1.005–1.030)
WBC, UA: >50 WBC/hpf (ref 0–5)
pH: 5 (ref 5.0–8.0)

## 2023-04-03 LAB — I-STAT CG4 LACTIC ACID, ED: Lactic Acid, Venous: 1.1 mmol/L (ref 0.5–1.9)

## 2023-04-03 MED ORDER — SODIUM CHLORIDE 0.9 % IV BOLUS
500.0000 mL | Freq: Once | INTRAVENOUS | Status: AC
  Administered 2023-04-03: 500 mL via INTRAVENOUS

## 2023-04-03 MED ORDER — ENOXAPARIN SODIUM 40 MG/0.4ML IJ SOSY
40.0000 mg | PREFILLED_SYRINGE | INTRAMUSCULAR | Status: DC
  Administered 2023-04-03 – 2023-04-10 (×8): 40 mg via SUBCUTANEOUS
  Filled 2023-04-03 (×8): qty 0.4

## 2023-04-03 MED ORDER — ONDANSETRON HCL 4 MG PO TABS: 4.0000 mg | ORAL_TABLET | Freq: Four times a day (QID) | ORAL | Status: DC | PRN

## 2023-04-03 MED ORDER — ACETAMINOPHEN 325 MG PO TABS
650.0000 mg | ORAL_TABLET | Freq: Four times a day (QID) | ORAL | Status: DC | PRN
  Administered 2023-04-03 – 2023-04-06 (×7): 650 mg via ORAL
  Filled 2023-04-03 (×7): qty 2

## 2023-04-03 MED ORDER — LACTATED RINGERS IV SOLN: INTRAVENOUS | Status: DC

## 2023-04-03 MED ORDER — GABAPENTIN 400 MG PO CAPS
400.0000 mg | ORAL_CAPSULE | Freq: Two times a day (BID) | ORAL | Status: DC
  Administered 2023-04-03 – 2023-04-11 (×16): 400 mg via ORAL
  Filled 2023-04-03 (×16): qty 1

## 2023-04-03 MED ORDER — ONDANSETRON HCL 4 MG/2ML IJ SOLN
4.0000 mg | Freq: Once | INTRAMUSCULAR | Status: AC
  Administered 2023-04-03: 4 mg via INTRAVENOUS
  Filled 2023-04-03: qty 2

## 2023-04-03 MED ORDER — ACETAMINOPHEN 650 MG RE SUPP: 650.0000 mg | Freq: Four times a day (QID) | RECTAL | Status: DC | PRN

## 2023-04-03 MED ORDER — OXYBUTYNIN CHLORIDE ER 10 MG PO TB24
10.0000 mg | ORAL_TABLET | Freq: Every day | ORAL | Status: DC
  Administered 2023-04-05 – 2023-04-11 (×7): 10 mg via ORAL
  Filled 2023-04-03 (×8): qty 1

## 2023-04-03 MED ORDER — DULOXETINE HCL 30 MG PO CPEP
30.0000 mg | ORAL_CAPSULE | Freq: Every day | ORAL | Status: DC
  Administered 2023-04-04 – 2023-04-11 (×8): 30 mg via ORAL
  Filled 2023-04-03 (×8): qty 1

## 2023-04-03 MED ORDER — OXYCODONE HCL 5 MG PO TABS
5.0000 mg | ORAL_TABLET | ORAL | Status: DC | PRN
  Administered 2023-04-05: 5 mg via ORAL
  Filled 2023-04-03: qty 1

## 2023-04-03 MED ORDER — SODIUM CHLORIDE 0.9% FLUSH
3.0000 mL | Freq: Two times a day (BID) | INTRAVENOUS | Status: DC
  Administered 2023-04-03 – 2023-04-10 (×8): 3 mL via INTRAVENOUS

## 2023-04-03 MED ORDER — BACLOFEN 10 MG PO TABS: 20.0000 mg | ORAL_TABLET | Freq: Three times a day (TID) | ORAL | Status: DC

## 2023-04-03 MED ORDER — QUETIAPINE FUMARATE 50 MG PO TABS
200.0000 mg | ORAL_TABLET | Freq: Every day | ORAL | Status: DC
  Filled 2023-04-03: qty 2

## 2023-04-03 MED ORDER — SODIUM CHLORIDE 0.9 % IV BOLUS
1000.0000 mL | Freq: Once | INTRAVENOUS | Status: AC
  Administered 2023-04-03: 1000 mL via INTRAVENOUS

## 2023-04-03 MED ORDER — SENNOSIDES-DOCUSATE SODIUM 8.6-50 MG PO TABS: 1.0000 | ORAL_TABLET | Freq: Every evening | ORAL | Status: DC | PRN

## 2023-04-03 MED ORDER — IOHEXOL 300 MG/ML  SOLN
100.0000 mL | Freq: Once | INTRAMUSCULAR | Status: AC | PRN
  Administered 2023-04-03: 100 mL via INTRAVENOUS

## 2023-04-03 MED ORDER — ONDANSETRON HCL 4 MG/2ML IJ SOLN
4.0000 mg | Freq: Four times a day (QID) | INTRAMUSCULAR | Status: DC | PRN
  Administered 2023-04-03 – 2023-04-06 (×2): 4 mg via INTRAVENOUS
  Filled 2023-04-03 (×2): qty 2

## 2023-04-03 MED ORDER — SODIUM CHLORIDE 0.9 % IV SOLN
2.0000 g | Freq: Three times a day (TID) | INTRAVENOUS | Status: DC
  Administered 2023-04-03 – 2023-04-04 (×2): 2 g via INTRAVENOUS
  Filled 2023-04-03 (×2): qty 12.5

## 2023-04-03 MED ORDER — HYDROMORPHONE HCL 1 MG/ML IJ SOLN
0.5000 mg | INTRAMUSCULAR | Status: DC | PRN
  Filled 2023-04-03: qty 0.5

## 2023-04-03 MED ORDER — BACLOFEN 10 MG PO TABS
10.0000 mg | ORAL_TABLET | Freq: Three times a day (TID) | ORAL | Status: DC
  Administered 2023-04-04: 10 mg via ORAL
  Filled 2023-04-03 (×2): qty 1
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
## 2023-04-03 NOTE — H&P (Signed)
History and Physical    Bradley Brandt PPI:[id number] DOB: [birth year] DOA: 04/03/2023  PCP: Donell Beers, FNP   Patient coming from: Home   Chief Complaint: Abdominal pain, N/V   HPI: Bradley Brandt is a 42 y.o. male with medical history significant for multiple sclerosis and bipolar disorder who presents with abdominal pain, nausea, and vomiting.  Patient reports developing intermittent abdominal pain, nausea, and nonbloody vomiting approximately 3 weeks ago.  Symptoms have been more severe over the past week and have become constant.  He gestures towards his mid abdomen and bilateral flanks as sites of pain.  He has been experiencing chills at home which have been severe at times.  ED Course: Upon arrival to the ED, patient is found to be afebrile and saturating well on room air with mild tachycardia and stable blood pressure.  Labs are most notable for WBC 20,800, normal lactic acid, creatinine 1.76, and albumin 2.5.  CT demonstrates nonspecific bilateral renal findings which could reflect pyelonephritis, mild urinary bladder wall thickening, and 1.5 cm hypoattenuating left upper pole renal lesion.  Blood cultures were collected in the ED and the patient was given 1.5 L of NS, Zofran, and cefepime.  Review of Systems:  All other systems reviewed and apart from HPI, are negative.  Past Medical History:  Diagnosis Date   Anxiety    Bipolar 1 disorder (HCC)    Bipolar disorder, unspecified (HCC) 01/03/2013   MS (multiple sclerosis) (HCC) dx 2006   relapsing-remitting right sided weakness   Neuromuscular disorder (HCC)    MS   Pneumonia    Right spastic hemiplegia (HCC)    Substance abuse (HCC)    Suprapubic catheter [facility])     Past Surgical History:  Procedure Laterality Date   BOTOX INJECTION N/A 03/12/2023   Procedure: BOTOX INJECTION;  Surgeon: Crista Elliot, MD;  Location: [facility];  Service: Urology;  Laterality: N/A;   INSERTION OF SUPRAPUBIC CATHETER N/A  03/12/2023   Procedure: SUPRAPUBIC CATHETER EXCHANGE;  Surgeon: Crista Elliot, MD;  Location: [facility];  Service: Urology;  Laterality: N/A;   IR CATHETER TUBE CHANGE  10/31/2022   IR CATHETER TUBE CHANGE  11/02/2022   IR FLUORO GUIDED NEEDLE PLC ASPIRATION/INJECTION LOC  09/07/2022   TUMOR REMOVAL  1983   abdomen benign    Social History:   reports that he has been smoking cigarettes. He has been exposed to tobacco smoke. He has never used smokeless tobacco. He reports current alcohol use of about 32.0 standard drinks of alcohol per week. He reports current drug use. Frequency: 7.00 times per week. Drug: Marijuana.  Allergies  Allergen Reactions   Lithium Rash    Family History  Adopted: Yes  Problem Relation Age of Onset   Lupus Mother    Ataxia Neg Hx    Chorea Neg Hx    Dementia Neg Hx    Mental retardation Neg Hx    Migraines Neg Hx    Multiple sclerosis Neg Hx    Neurofibromatosis Neg Hx    Neuropathy Neg Hx    Parkinsonism Neg Hx    Seizures Neg Hx    Stroke Neg Hx      Prior to Admission medications   Medication Sig Start Date End Date Taking? Authorizing Provider  baclofen (LIORESAL) 20 MG tablet Take 1 tablet (20 mg total) by mouth 3 (three) times daily. 01/17/23   Donell Beers, FNP  DULoxetine (CYMBALTA) 30 MG capsule Take  1 capsule (30 mg total) by mouth daily. 01/17/23   Paseda, Novitski Kay, FNP  gabapentin (NEURONTIN) 400 MG capsule Take 1 capsule (400 mg total) by mouth 3 (three) times daily. 01/19/23   Paseda, Earll Kay, FNP  natalizumab (TYSABRI) 300 MG/15ML injection Inject 15 mLs (300 mg total) into the vein every 28 (twenty-eight) days. 03/20/23   Sater, Pearletha Furl, MD  natalizumab 300 mg in sodium chloride 0.9 % 100 mL Inject 300 mg into the vein every 28 (twenty-eight) days. 03/16/23   Sater, Pearletha Furl, MD  oxybutynin (DITROPAN-XL) 10 MG 24 hr tablet Take 1 tablet (10 mg total) by mouth daily. 01/17/23   Paseda, Meinders Kay, FNP  QUEtiapine (SEROQUEL)  200 MG tablet Take 1 tablet (200 mg total) by mouth at bedtime. 01/17/23   Donell Beers, FNP    Physical Exam: Vitals:   04/03/23 1445 04/03/23 1800  BP: (!) 115/91   Resp: 16   Temp: 99.1 F (37.3 C) 98.4 F (36.9 C)  TempSrc: Oral   SpO2: 99%     Constitutional: NAD, calm  Eyes: PERTLA, lids and conjunctivae normal ENMT: Mucous membranes are moist. Posterior pharynx clear of any exudate or lesions.   Neck: supple, no masses  Respiratory: no wheezing, no crackles. No accessory muscle use.  Cardiovascular: S1 & S2 heard, regular rate and rhythm. No extremity edema.   Abdomen: No distension, soft, no guarding. Bowel sounds active.  Musculoskeletal: no clubbing / cyanosis. No joint deformity upper and lower extremities.   Skin: no significant rashes, lesions, ulcers. Warm, dry, well-perfused. Neurologic: CN 2-12 grossly intact. Tremulous. Weaker on right. Alert and oriented.  Psychiatric: Pleasant. Cooperative.    Labs and Imaging on Admission: I have personally reviewed following labs and imaging studies  CBC: Recent Labs  Lab 04/03/23 1550  WBC 28.9*  NEUTROABS 24.1*  HGB 10.8*  HCT 32.5*  MCV 93.9  PLT 378   Basic Metabolic Panel: Recent Labs  Lab 04/03/23 1550  NA 137  K 4.8  CL 101  CO2 23  GLUCOSE 125*  BUN 27*  CREATININE 1.76*  CALCIUM 8.3*   GFR: CrCl cannot be calculated (Unknown ideal weight.). Liver Function Tests: Recent Labs  Lab 04/03/23 1550  AST 54*  ALT 49*  ALKPHOS 93  BILITOT 1.0  PROT 7.8  ALBUMIN 2.5*   No results for input(s): "LIPASE", "AMYLASE" in the last 168 hours. No results for input(s): "AMMONIA" in the last 168 hours. Coagulation Profile: No results for input(s): "INR", "PROTIME" in the last 168 hours. Cardiac Enzymes: No results for input(s): "CKTOTAL", "CKMB", "CKMBINDEX", "TROPONINI" in the last 168 hours. BNP (last 3 results) No results for input(s): "PROBNP" in the last 8760 hours. HbA1C: No results  for input(s): "HGBA1C" in the last 72 hours. CBG: No results for input(s): "GLUCAP" in the last 168 hours. Lipid Profile: No results for input(s): "CHOL", "HDL", "LDLCALC", "TRIG", "CHOLHDL", "LDLDIRECT" in the last 72 hours. Thyroid Function Tests: No results for input(s): "TSH", "T4TOTAL", "FREET4", "T3FREE", "THYROIDAB" in the last 72 hours. Anemia Panel: No results for input(s): "VITAMINB12", "FOLATE", "FERRITIN", "TIBC", "IRON", "RETICCTPCT" in the last 72 hours. Urine analysis:    Component Value Date/Time   COLORURINE YELLOW 04/03/2023 1720   APPEARANCEUR HAZY (A) 04/03/2023 1720   LABSPEC 1.011 04/03/2023 1720   PHURINE 5.0 04/03/2023 1720   GLUCOSEU NEGATIVE 04/03/2023 1720   HGBUR MODERATE (A) 04/03/2023 1720   BILIRUBINUR NEGATIVE 04/03/2023 1720   BILIRUBINUR neg 02/06/2023 1001  KETONESUR NEGATIVE 04/03/2023 1720   PROTEINUR 100 (A) 04/03/2023 1720   UROBILINOGEN 0.2 02/06/2023 1001   UROBILINOGEN 1.0 01/02/2013 1453   NITRITE NEGATIVE 04/03/2023 1720   LEUKOCYTESUR LARGE (A) 04/03/2023 1720   Sepsis Labs: @LABRCNTIP (procalcitonin:4,lacticidven:4) )No results found for this or any previous visit (from the past 240 hour(s)).   Radiological Exams on Admission: CT ABDOMEN PELVIS W CONTRAST  Result Date: 04/03/2023 CLINICAL DATA:  Nonlocalized abdominal pain. History of multiple sclerosis. EXAM: CT ABDOMEN AND PELVIS WITH CONTRAST TECHNIQUE: Multidetector CT imaging of the abdomen and pelvis was performed using the standard protocol following bolus administration of intravenous contrast. RADIATION DOSE REDUCTION: This exam was performed according to the departmental dose-optimization program which includes automated exposure control, adjustment of the mA and/or kV according to patient size and/or use of iterative reconstruction technique. CONTRAST:  OMNIPAQUE IOHEXOL 300 MG/ML  SOLN COMPARISON:  09/14/2022 FINDINGS: Mild to moderate motion degradation throughout the  abdomen and pelvis. Lower chest: No gross pulmonary abnormality. Normal heart size without pericardial or pleural effusion. Hepatobiliary: Normal liver. Normal gallbladder, without biliary ductal dilatation. Pancreas: Normal, without mass or ductal dilatation. Spleen: Normal in size, without focal abnormality. Adrenals/Urinary Tract: Normal adrenal glands. 1.5 cm upper pole left renal low-density lesion is not readily apparent on the prior. Suboptimal corticomedullary differentiation is nonspecific and symmetric. 6 mm inter/lower pole left renal collecting system calculus. No hydronephrosis. Suprapubic catheter. The bladder wall is mildly thickened. Stomach/Bowel: Normal stomach, without wall thickening. Normal colon. Appendix not visualized. Normal small bowel. Vascular/Lymphatic: Aortic atherosclerosis. No abdominopelvic adenopathy. Reproductive: Normal prostate. Testicles positioned high in the inguinal canal, presumably transient given absence on the prior. Other: No significant free fluid. No abdominal ascites. No free intraperitoneal air. Musculoskeletal: No acute osseous abnormality. IMPRESSION: 1. Motion degraded exam. 2. Left nephrolithiasis without obstructive uropathy. 3. Suboptimal corticomedullary differentiation in both kidneys is nonspecific. Can be seen with renal insufficiency or pyelonephritis. 4. Suprapubic catheter in place with mild bladder wall thickening which can be seen with cystitis. 5. Upper pole left renal 1.5 cm hypoattenuating lesion is not readily apparent on the prior. Although this could represent an interval cyst or complex cyst, if the patient is diagnosed with left-sided pyelonephritis, focal infection (lobar nephronia) would be a concern. 6. Lack of visualization of the appendix. Electronically Signed   By: Jeronimo Greaves M.D.   On: 04/03/2023 19:55   DG Chest 1 View  Result Date: 04/03/2023 CLINICAL DATA:  dyspnea EXAM: CHEST  1 VIEW COMPARISON:  August 01, 2022 FINDINGS:  The cardiomediastinal silhouette is unchanged in contour. No pleural effusion. No pneumothorax. No acute pleuroparenchymal abnormality. IMPRESSION: No acute cardiopulmonary abnormality. Electronically Signed   By: Meda Klinefelter M.D.   On: 04/03/2023 17:18     Assessment/Plan   1. Pyelonephritis  - Continue cefepime, follow cultures and clinical course, repeat imaging if worsens of fails to improve   2. AKI  - SCr is 1.76 on admission, up from baseline of <1 - No hydronephrosis on CT  - Continue IVF hydration, renally-dose medications, repeat chem panel in am    3. Multiple sclerosis  - Relapsing-remitting with neurogenic bladder and motor deficits managed with Tysabri  - Continue baclofen and Neurontin with renal-dosing, continue supportive care    4. Anxiety  - Continue Cymbalta and Seroquel     DVT prophylaxis: Lovenox  Code Status: Full  Level of Care: Level of care: Telemetry Family Communication: Significant other at bedside   Disposition Plan:  Patient is from: home  Anticipated d/c is to: Home  Anticipated d/c date is: 04/06/23  Patient currently: Pending treatment of UTI, tolerance of adequate oral intake  Consults called: None  Admission status: Inpatient     Briscoe Deutscher, MD [facility]  04/03/2023, 8:55 PM

## 2023-04-03 NOTE — ED Provider Notes (Signed)
  Physical Exam  BP (!) 115/91 (BP Location: Left Arm)   Temp 98.4 F (36.9 C)   Resp 16   SpO2 99%   Physical Exam  Procedures  Procedures  ED Course / MDM    Medical Decision Making Care assumed 4 pm.  Patient is here with vomiting and diffuse abdominal pain.  Patient is bedbound from multiple sclerosis has a suprapubic catheter. Signout pending labs and CT abdomen pelvis  8:49 PM I reviewed patient's labs and independently interpreted imaging studies.  Labs showed white count of 28,000 which is new for him.  Patient also has an AKI.  Patient's urinalysis showed many bacteria and UTI.  CT showed pyelonephritis.  Patient was given IV cefepime.  At this point hospitalist will admit.  Problems Addressed: AKI (acute kidney injury) [facility]): acute illness or injury Pyelonephritis: acute illness or injury Sepsis, due to unspecified organism, unspecified whether acute organ dysfunction present [facility]): acute illness or injury Vomiting without nausea, unspecified vomiting type: acute illness or injury  Amount and/or Complexity of Data Reviewed Labs: ordered. Decision-making details documented in ED Course. Radiology: ordered and independent interpretation performed.  Risk Prescription drug management. Decision regarding hospitalization.  CRITICAL CARE Performed by: Richardean Canal   Total critical care time: 37 minutes  Critical care time was exclusive of separately billable procedures and treating other patients.  Critical care was necessary to treat or prevent imminent or life-threatening deterioration.  Critical care was time spent personally by me on the following activities: development of treatment plan with patient and/or surrogate as well as nursing, discussions with consultants, evaluation of patient's response to treatment, examination of patient, obtaining history from patient or surrogate, ordering and performing treatments and interventions, ordering and review of laboratory  studies, ordering and review of radiographic studies, pulse oximetry and re-evaluation of patient's condition.         Charlynne Pander, MD 04/03/23 [phone]

## 2023-04-03 NOTE — Progress Notes (Signed)
Pharmacy Antibiotic Note  Bradley Brandt is a 42 y.o. male admitted on 04/03/2023 with sepsis.  Pharmacy has been consulted for cefepime dosing.  Wt not entered in ED. Used wgith from outpt visit on 01/08/22 of 78 kg, Scr 1.76 mg/ld, CrCl 59 ml/min. Anticipate improvement of CrCl with fluid so will dose as below.   Plan: Cefepime 2 gr IV q8h  Monitor clinical course, renal function, cultures as available      Temp (24hrs), Avg:98.8 F (37.1 C), Min:98.4 F (36.9 C), Max:99.1 F (37.3 C)  Recent Labs  Lab 04/03/23 1550 04/03/23 1802  WBC 28.9*  --   CREATININE 1.76*  --   LATICACIDVEN  --  1.1    CrCl cannot be calculated (Unknown ideal weight.).    Allergies  Allergen Reactions   Lithium Rash    Antimicrobials this admission: 10/15 cefepime >>   Dose adjustments this admission:    Microbiology results: 10/15 BCx:     [name], PharmD, BCPS 04/03/2023 8:45 PM

## 2023-04-03 NOTE — ED Triage Notes (Signed)
Pt has MS and c/o n/v x 1 week since his last shot per sister.

## 2023-04-04 DIAGNOSIS — N10 Acute tubulo-interstitial nephritis: Secondary | ICD-10-CM | POA: Diagnosis not present

## 2023-04-04 DIAGNOSIS — G35 Multiple sclerosis: Secondary | ICD-10-CM | POA: Diagnosis not present

## 2023-04-04 LAB — COMPREHENSIVE METABOLIC PANEL
ALT: 52 U/L — ABNORMAL HIGH (ref 0–44)
AST: 62 U/L — ABNORMAL HIGH (ref 15–41)
Albumin: 2.3 g/dL — ABNORMAL LOW (ref 3.5–5.0)
Alkaline Phosphatase: 85 U/L (ref 38–126)
Anion gap: 11 (ref 5–15)
BUN: 25 mg/dL — ABNORMAL HIGH (ref 6–20)
CO2: 25 mmol/L (ref 22–32)
Calcium: 8.3 mg/dL — ABNORMAL LOW (ref 8.9–10.3)
Chloride: 108 mmol/L (ref 98–111)
Creatinine, Ser: 1.82 mg/dL — ABNORMAL HIGH (ref 0.61–1.24)
GFR, Estimated: 46 mL/min — ABNORMAL LOW (ref >60)
Glucose, Bld: 126 mg/dL — ABNORMAL HIGH (ref 70–99)
Potassium: 4.6 mmol/L (ref 3.5–5.1)
Sodium: 144 mmol/L (ref 135–145)
Total Bilirubin: 0.9 mg/dL (ref 0.3–1.2)
Total Protein: 7 g/dL (ref 6.5–8.1)

## 2023-04-04 LAB — BLOOD CULTURE ID PANEL (REFLEXED) - BCID2

## 2023-04-04 LAB — CBC
HCT: 30.9 % — ABNORMAL LOW (ref 39.0–52.0)
Hemoglobin: 9.9 g/dL — ABNORMAL LOW (ref 13.0–17.0)
MCH: 31 pg (ref 26.0–34.0)
MCHC: 32 g/dL (ref 30.0–36.0)
MCV: 96.9 fL (ref 80.0–100.0)
Platelets: 297 10*3/uL (ref 150–400)
RBC: 3.19 MIL/uL — ABNORMAL LOW (ref 4.22–5.81)
RDW: 14.2 % (ref 11.5–15.5)
WBC: 23.1 10*3/uL — ABNORMAL HIGH (ref 4.0–10.5)
nRBC: 0 % (ref 0.0–0.2)

## 2023-04-04 MED ORDER — DEXTROSE-SODIUM CHLORIDE 5-0.9 % IV SOLN: INTRAVENOUS | Status: DC

## 2023-04-04 MED ORDER — SODIUM CHLORIDE 0.9 % IV SOLN
1.0000 g | Freq: Three times a day (TID) | INTRAVENOUS | Status: DC
  Administered 2023-04-04 – 2023-04-10 (×20): 1 g via INTRAVENOUS
  Filled 2023-04-04 (×22): qty 20

## 2023-04-04 MED ORDER — BACLOFEN 10 MG PO TABS: 10.0000 mg | ORAL_TABLET | Freq: Three times a day (TID) | ORAL | Status: DC | PRN

## 2023-04-04 NOTE — Progress Notes (Signed)
The patient's HR was 116 and Temp 100.7 at 1420. I notified MD, he said to give patient tylenol

## 2023-04-04 NOTE — Progress Notes (Signed)
PHARMACY - PHYSICIAN COMMUNICATION CRITICAL VALUE ALERT - BLOOD CULTURE IDENTIFICATION (BCID)  Bradley Brandt is an 42 y.o. male who presented to [facility] on 04/03/2023 with a chief complaint of abdominal/flank pain, N/V.   Assessment:  UTI, Pyelonephritis UCx: pending BCx: 4/4 bottles GNR BCID: Escherichia coli, CTX -M detected   Name of physician (or Provider) Contacted: Dr Hanley Ben  Current antibiotics: Cefepime  Changes to prescribed antibiotics recommended:  Recommendations accepted by provider Change from Cefepime to Meropenem 1g IV q8h  Results for orders placed or performed during the hospital encounter of 04/03/23  Blood Culture ID Panel (Reflexed) (Collected: 04/03/2023  5:50 PM)  Result Value Ref Range   Enterococcus faecalis NOT DETECTED NOT DETECTED   Enterococcus Faecium NOT DETECTED NOT DETECTED   Listeria monocytogenes NOT DETECTED NOT DETECTED   Staphylococcus species NOT DETECTED NOT DETECTED   Staphylococcus aureus (BCID) NOT DETECTED NOT DETECTED   Staphylococcus epidermidis NOT DETECTED NOT DETECTED   Staphylococcus lugdunensis NOT DETECTED NOT DETECTED   Streptococcus species NOT DETECTED NOT DETECTED   Streptococcus agalactiae NOT DETECTED NOT DETECTED   Streptococcus pneumoniae NOT DETECTED NOT DETECTED   Streptococcus pyogenes NOT DETECTED NOT DETECTED   A.calcoaceticus-baumannii NOT DETECTED NOT DETECTED   Bacteroides fragilis NOT DETECTED NOT DETECTED   Enterobacterales DETECTED (A) NOT DETECTED   Enterobacter cloacae complex NOT DETECTED NOT DETECTED   Escherichia coli DETECTED (A) NOT DETECTED   Klebsiella aerogenes NOT DETECTED NOT DETECTED   Klebsiella oxytoca NOT DETECTED NOT DETECTED   Klebsiella pneumoniae NOT DETECTED NOT DETECTED   Proteus species NOT DETECTED NOT DETECTED   Salmonella species NOT DETECTED NOT DETECTED   Serratia marcescens NOT DETECTED NOT DETECTED   Haemophilus influenzae NOT DETECTED NOT DETECTED   Neisseria  meningitidis NOT DETECTED NOT DETECTED   Pseudomonas aeruginosa NOT DETECTED NOT DETECTED   Stenotrophomonas maltophilia NOT DETECTED NOT DETECTED   Candida albicans NOT DETECTED NOT DETECTED   Candida auris NOT DETECTED NOT DETECTED   Candida glabrata NOT DETECTED NOT DETECTED   Candida krusei NOT DETECTED NOT DETECTED   Candida parapsilosis NOT DETECTED NOT DETECTED   Candida tropicalis NOT DETECTED NOT DETECTED   Cryptococcus neoformans/gattii NOT DETECTED NOT DETECTED   CTX-M ESBL DETECTED (A) NOT DETECTED   Carbapenem resistance IMP NOT DETECTED NOT DETECTED   Carbapenem resistance KPC NOT DETECTED NOT DETECTED   Carbapenem resistance NDM NOT DETECTED NOT DETECTED   Carbapenem resist OXA 48 LIKE NOT DETECTED NOT DETECTED   Carbapenem resistance VIM NOT DETECTED NOT DETECTED     [name] PharmD, BCPS [facility] main pharmacy [phone] 04/04/2023 11:48 AM

## 2023-04-04 NOTE — Plan of Care (Signed)
  Problem: Clinical Measurements: Goal: Signs and symptoms of infection will decrease Outcome: Progressing   Problem: Respiratory: Goal: Ability to maintain adequate ventilation will improve Outcome: Progressing   Problem: Coping: Goal: Level of anxiety will decrease Outcome: Progressing   Problem: Pain Managment: Goal: General experience of comfort will improve Outcome: Progressing   Problem: Safety: Goal: Ability to remain free from injury will improve Outcome: Progressing   Problem: Skin Integrity: Goal: Risk for impaired skin integrity will decrease Outcome: Progressing

## 2023-04-04 NOTE — TOC Initial Note (Signed)
Transition of Care [facility]) - Initial/Assessment Note    Patient Details  Name: Bradley Brandt MRN: [id number] Date of Birth: Jun 21, 1980  Transition of Care [facility]) CM/SW Contact:    Amada Jupiter, LCSW Phone Number: 04/04/2023, 3:14 PM  Clinical Narrative:                  Met with pt and spoke with sister to review for potential dc planning needs.  Pt A&O, however, voice is very soft and difficult to hear.  He was agreeable to CSW contacting sister for additional information.  Both report that pt has support in the home from sister, s/o and PCS aide (per sister, approx 21 hours/ week).  Sister notes that someone is with pt all the time (pt noted "most of the time").  Pt has been primarily bed and w/c bound for the past year.  They have a hospital bed, hoyer lift, wheelchair, bedside commode and RW in the home already.  Pt using Medicaid transportation for MD appointments, etc. But will need ambulance transport home with this discharge.  Both agreeable to CSW following during stay for any additional needs.    Expected Discharge Plan: Home w Home Health Services Barriers to Discharge: Continued Medical Work up   Patient Goals and CMS Choice Patient states their goals for this hospitalization and ongoing recovery are:: return home          Expected Discharge Plan and Services In-house Referral: Clinical Social Work     Living arrangements for the past 2 months: Single Family Home                                      Prior Living Arrangements/Services Living arrangements for the past 2 months: Single Family Home Lives with:: Siblings, Significant Other Patient language and need for interpreter reviewed:: Yes Do you feel safe going back to the place where you live?: Yes      Need for Family Participation in Patient Care: Yes (Comment) Care giver support system in place?: Yes (comment) Current home services: DME, Homehealth aide Criminal Activity/Legal Involvement Pertinent  to Current Situation/Hospitalization: No - Comment as needed  Activities of Daily Living   ADL Screening (condition at time of admission) Independently performs ADLs?: No Does the patient have a NEW difficulty with bathing/dressing/toileting/self-feeding that is expected to last >3 days?: No (hx of MS) Does the patient have a NEW difficulty with getting in/out of bed, walking, or climbing stairs that is expected to last >3 days?: No (bedbound) Does the patient have a NEW difficulty with communication that is expected to last >3 days?: No Is the patient deaf or have difficulty hearing?: No Does the patient have difficulty seeing, even when wearing glasses/contacts?: No Does the patient have difficulty concentrating, remembering, or making decisions?: No  Permission Sought/Granted Permission sought to share information with : Family Supports Permission granted to share information with : Yes, Verbal Permission Granted  Share Information with NAME: sister, Bradley Brandt @ [phone]           Emotional Assessment Appearance:: Appears stated age Attitude/Demeanor/Rapport: Gracious Affect (typically observed): Accepting Orientation: : Oriented to Self, Oriented to Place, Oriented to  Time, Oriented to Situation Alcohol / Substance Use: Not Applicable Psych Involvement: No (comment)  Admission diagnosis:  Pyelonephritis [N12] AKI (acute kidney injury) (HCC) [N17.9] Acute pyelonephritis [N10] Sepsis, due to unspecified organism, unspecified whether acute organ  dysfunction present (HCC) [A41.9] Vomiting without nausea, unspecified vomiting type [R11.11] Patient Active Problem List   Diagnosis Date Noted   Acute pyelonephritis 04/03/2023   AKI (acute kidney injury) (HCC) 04/03/2023   Suspected UTI 02/06/2023   Urinary incontinence 02/06/2023   Suprapubic catheter (HCC) 01/31/2023   Marijuana user 01/17/2023   Tobacco use disorder, continuous 01/17/2023   Impaired mobility and  ADLs 01/17/2023   UTI (urinary tract infection) 10/30/2022   Bipolar 1 disorder (HCC) 10/30/2022   Acute urinary retention 08/01/2022   Fever 08/01/2022   Rhabdomyolysis 07/30/2022   Multiple sclerosis exacerbation (HCC) 07/12/2022   Marijuana use 11/22/2020   History of marijuana use 11/22/2020   Chronic pain syndrome 11/21/2020   Encounter for examination following treatment at hospital 11/21/2020   Disorder of skeletal system 11/21/2020   Problems influencing health status 11/21/2020   MS (multiple sclerosis) (HCC)    Neuromuscular disorder (HCC)    Bipolar disorder in full remission (HCC) 11/16/2020   Other insomnia 08/11/2020   Generalized anxiety disorder 08/11/2020   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HCC) 12/31/2017   Optic neuritis 08/02/2017   High risk medication use 08/02/2017   Vitamin D deficiency 09/19/2016   Hypercholesterolemia 09/19/2016   Numbness 12/16/2015   Spastic gait 12/16/2015   Urinary hesitancy 12/16/2015   Low back pain with sciatica 09/28/2014   Polysubstance dependence (HCC) 02/09/2014   Combined drug dependence excluding opioids (HCC) 02/09/2014   Relapsing remitting multiple sclerosis (HCC) 05/28/2013   Elevated BP 01/06/2013   Bipolar disorder, unspecified (HCC) 01/03/2013   Bipolar disorder (HCC) 01/03/2013   Right hemiparesis (HCC) 01/02/2013   Multiple sclerosis flare 01/02/2013   Hemiplegia (HCC) 01/02/2013   PCP:  Donell Beers, FNP Pharmacy:   [id number] [place], [place] - [address] [place]  [address] Phone: [phone] Fax: [phone]  [facility] - [place], [place] - [address] Portal to Registered [facility] [address] [address] Phone: [phone] Fax: [phone]  [facility] [address] [place] [address] Phone: [phone] Fax: [phone]     Social Determinants of Health  (SDOH) Social History: SDOH Screenings   Food Insecurity: Patient Declined (04/03/2023)  Housing: Patient Declined (04/03/2023)  Transportation Needs: No Transportation Needs (04/03/2023)  Utilities: Not At Risk (04/03/2023)  Depression (PHQ2-9): Low Risk  (02/06/2023)  Social Connections: Unknown (10/17/2021)   [facility] from [facility]  Tobacco Use: High Risk (04/03/2023)   SDOH Interventions:     Readmission Risk Interventions    04/04/2023    3:02 PM  Readmission Risk Prevention Plan  Transportation Screening Complete  Medication Review (RN Care Manager) Complete  PCP or Specialist appointment within 3-5 days of discharge Complete  HRI or Home Care Consult Complete  SW Recovery Care/Counseling Consult Complete  Skilled Nursing Facility Not Applicable

## 2023-04-04 NOTE — Progress Notes (Signed)
PROGRESS NOTE    Bradley Brandt  ZOX:[id number] DOB: 02-Jan-1981 DOA: 04/03/2023 PCP: Donell Beers, FNP   Brief Narrative:  42 y.o. male with medical history significant for multiple sclerosis and bipolar disorder presented with abdominal pain, nausea and vomiting worsening for the last 3 weeks.  On presentation, WBC was 28.9, creatinine of 1.76.  CT of abdomen and pelvis with contrast showed nonspecific bilateral renal findings which could reflect pyelonephritis, mild urinary bladder thickening and 1.5 cm hypoattenuating left upper lobe renal lesion.  He was started on IV fluids and antibiotics.  Assessment & Plan:   Possible acute pyelonephritis -Imaging as above.  Continue broad-spectrum antibiotics.  Follow cultures.  Leukocytosis -WBCs 28.9 on presentation.  Improving to 23.1 this morning.  Monitor.  AKI -Possibly from pyelonephritis.  Creatinine 1.76 on presentation.  Baseline creatinine of less than 1.  Creatinine worsening to 1.82 today.  Switch IV fluids to normal saline at 100 cc an hour.  Repeat a.m. labs  Normocytic anemia Questionable cause.  Hemoglobin stable.  Monitor intermittently  Elevated LFTs -Questionable cause.  Still mildly elevated.  Repeat a.m. LFTs.  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 -Outpatient follow-up with urology regarding suprapubic catheter care -PT eval.  Anxiety -Continue Cymbalta -Patient apparently does not take Seroquel or baclofen.  DC those.   DVT prophylaxis: Lovenox Code Status: Full Family Communication: None at bedside Disposition Plan: Status is: Inpatient Remains inpatient appropriate because: Of severity of illness.  Need for IV fluids and antibiotics.    Consultants: None  Procedures: None  Antimicrobials: Cefepime from 04/03/2023 onwards   Subjective: Patient seen and examined at bedside.  Slow to  respond.  Poor historian.  No fever, seizures, vomiting reported  Objective: Vitals:   04/03/23 2304 04/03/23 2339 04/04/23 0121 04/04/23 0614  BP: 128/72 117/64 107/64 123/69  Pulse:  95 83   Resp: (!) 22 17 17 17   Temp:  99.4 F (37.4 C) 98.5 F (36.9 C) 100 F (37.8 C)  TempSrc:  Oral Oral Oral  SpO2: 96% 94% 95% 94%  Weight:  69.3 kg    Height:  5\' 11"  (1.803 m)      Intake/Output Summary (Last 24 hours) at 04/04/2023 1029 Last data filed at 04/04/2023 1016 Gross per 24 hour  Intake 3251.12 ml  Output 1350 ml  Net 1901.12 ml   Filed Weights   04/03/23 2339  Weight: 69.3 kg    Examination:  General exam: Appears calm and comfortable.  On room air.  No distress. Respiratory system: Bilateral decreased breath sounds at bases with scattered crackles Cardiovascular system: S1 & S2 heard, Rate controlled Gastrointestinal system: Abdomen is nondistended, soft and nontender. Normal bowel sounds heard. Extremities: No cyanosis, clubbing, edema  Genitourinary: Suprapubic catheter present Central nervous system: Awake, slow to respond.  Poor historian.  Weaker on the right.   Skin: No rashes, lesions or ulcers Psychiatry: Flat affect.  Not agitated.  Data Reviewed: I have personally reviewed following labs and imaging studies  CBC: Recent Labs  Lab 04/03/23 1550 04/04/23 0337  WBC 28.9* 23.1*  NEUTROABS 24.1*  --   HGB 10.8* 9.9*  HCT 32.5* 30.9*  MCV 93.9 96.9  PLT 378 297   Basic Metabolic Panel: Recent Labs  Lab 04/03/23 1550 04/04/23 0337  NA 137 144  K 4.8 4.6  CL 101 108  CO2 23 25  GLUCOSE 125* 126*  BUN 27* 25*  CREATININE 1.76* 1.82*  CALCIUM 8.3* 8.3*   GFR: Estimated Creatinine Clearance: 50.8 mL/min (A) (by C-G formula based on SCr of 1.82 mg/dL (H)). Liver Function Tests: Recent Labs  Lab 04/03/23 1550 04/04/23 0337  AST 54* 62*  ALT 49* 52*  ALKPHOS 93 85  BILITOT 1.0 0.9  PROT 7.8 7.0  ALBUMIN 2.5* 2.3*   No results for  input(s): "LIPASE", "AMYLASE" in the last 168 hours. No results for input(s): "AMMONIA" in the last 168 hours. Coagulation Profile: No results for input(s): "INR", "PROTIME" in the last 168 hours. Cardiac Enzymes: No results for input(s): "CKTOTAL", "CKMB", "CKMBINDEX", "TROPONINI" in the last 168 hours. BNP (last 3 results) No results for input(s): "PROBNP" in the last 8760 hours. HbA1C: No results for input(s): "HGBA1C" in the last 72 hours. CBG: No results for input(s): "GLUCAP" in the last 168 hours. Lipid Profile: No results for input(s): "CHOL", "HDL", "LDLCALC", "TRIG", "CHOLHDL", "LDLDIRECT" in the last 72 hours. Thyroid Function Tests: No results for input(s): "TSH", "T4TOTAL", "FREET4", "T3FREE", "THYROIDAB" in the last 72 hours. Anemia Panel: No results for input(s): "VITAMINB12", "FOLATE", "FERRITIN", "TIBC", "IRON", "RETICCTPCT" in the last 72 hours. Sepsis Labs: Recent Labs  Lab 04/03/23 1802  LATICACIDVEN 1.1    Recent Results (from the past 240 hour(s))  Blood culture (routine x 2)     Status: None (Preliminary result)   Collection Time: 04/03/23  5:50 PM   Specimen: Site Not Specified; Blood  Result Value Ref Range Status   Specimen Description   Final    SITE NOT SPECIFIED BLOOD Performed at [facility], [address], [place], [place] [address]    Special Requests   Final    BOTTLES DRAWN AEROBIC AND ANAEROBIC Blood Culture adequate volume Performed at [facility], [address], [place], [place] [address]    Culture  Setup Time   Final    GRAM NEGATIVE RODS IN BOTH AEROBIC AND ANAEROBIC BOTTLES Organism ID to follow Performed at [facility], [address], [place], [place] [address]    Culture PENDING  Incomplete   Report Status PENDING  Incomplete  Blood culture (routine x 2)     Status: None (Preliminary result)   Collection Time: 04/03/23  5:50 PM   Specimen: Site Not Specified; Blood  Result Value Ref Range  Status   Specimen Description   Final    SITE NOT SPECIFIED BLOOD Performed at [facility], [address], [place], [place] [address]    Special Requests   Final    BOTTLES DRAWN AEROBIC AND ANAEROBIC Blood Culture adequate volume Performed at [facility], [address], [place], [place] [address]    Culture  Setup Time   Final    GRAM NEGATIVE RODS ANAEROBIC BOTTLE ONLY Performed at [facility], [address], [place], [place] [address]    Culture PENDING  Incomplete   Report Status PENDING  Incomplete         Radiology Studies: CT ABDOMEN PELVIS W CONTRAST  Result Date: 04/03/2023 CLINICAL DATA:  Nonlocalized abdominal pain. History of multiple sclerosis. EXAM: CT ABDOMEN AND PELVIS WITH CONTRAST TECHNIQUE: Multidetector CT imaging of the abdomen and pelvis was performed using the standard protocol following bolus administration of intravenous contrast. RADIATION DOSE REDUCTION: This exam was performed according to the departmental dose-optimization program which includes automated exposure control, adjustment of the mA and/or kV according to patient size  and/or use of iterative reconstruction technique. CONTRAST:  OMNIPAQUE IOHEXOL 300 MG/ML  SOLN COMPARISON:  09/14/2022 FINDINGS: Mild to moderate motion degradation throughout the abdomen and pelvis. Lower chest: No gross pulmonary abnormality. Normal heart size without pericardial or pleural effusion. Hepatobiliary: Normal liver. Normal gallbladder, without biliary ductal dilatation. Pancreas: Normal, without mass or ductal dilatation. Spleen: Normal in size, without focal abnormality. Adrenals/Urinary Tract: Normal adrenal glands. 1.5 cm upper pole left renal low-density lesion is not readily apparent on the prior. Suboptimal corticomedullary differentiation is nonspecific and symmetric. 6 mm inter/lower pole left renal collecting system calculus. No hydronephrosis. Suprapubic catheter. The  bladder wall is mildly thickened. Stomach/Bowel: Normal stomach, without wall thickening. Normal colon. Appendix not visualized. Normal small bowel. Vascular/Lymphatic: Aortic atherosclerosis. No abdominopelvic adenopathy. Reproductive: Normal prostate. Testicles positioned high in the inguinal canal, presumably transient given absence on the prior. Other: No significant free fluid. No abdominal ascites. No free intraperitoneal air. Musculoskeletal: No acute osseous abnormality. IMPRESSION: 1. Motion degraded exam. 2. Left nephrolithiasis without obstructive uropathy. 3. Suboptimal corticomedullary differentiation in both kidneys is nonspecific. Can be seen with renal insufficiency or pyelonephritis. 4. Suprapubic catheter in place with mild bladder wall thickening which can be seen with cystitis. 5. Upper pole left renal 1.5 cm hypoattenuating lesion is not readily apparent on the prior. Although this could represent an interval cyst or complex cyst, if the patient is diagnosed with left-sided pyelonephritis, focal infection (lobar nephronia) would be a concern. 6. Lack of visualization of the appendix. Electronically Signed   By: Jeronimo Greaves M.D.   On: 04/03/2023 19:55   DG Chest 1 View  Result Date: 04/03/2023 CLINICAL DATA:  dyspnea EXAM: CHEST  1 VIEW COMPARISON:  August 01, 2022 FINDINGS: The cardiomediastinal silhouette is unchanged in contour. No pleural effusion. No pneumothorax. No acute pleuroparenchymal abnormality. IMPRESSION: No acute cardiopulmonary abnormality. Electronically Signed   By: Meda Klinefelter M.D.   On: 04/03/2023 17:18        Scheduled Meds:  baclofen  10 mg Oral TID   DULoxetine  30 mg Oral Daily   enoxaparin (LOVENOX) injection  40 mg Subcutaneous Q24H   gabapentin  400 mg Oral BID   oxybutynin  10 mg Oral Daily   QUEtiapine  200 mg Oral QHS   sodium chloride flush  3 mL Intravenous Q12H   Continuous Infusions:  ceFEPime (MAXIPIME) IV 2 g (04/04/23 0603)    lactated ringers 125 mL/hr at 04/04/23 1004          Shadaya Marschner, MD [facility] 04/04/2023, 10:29 AM

## 2023-04-04 NOTE — Progress Notes (Signed)
   04/04/23 2303  Assess: MEWS Score  Temp (!) 102.6 F (39.2 C)  BP 134/69  MAP (mmHg) 86  Resp 18  SpO2 97 %  O2 Device Room Air  Assess: MEWS Score  MEWS Temp 2  MEWS Systolic 0  MEWS Pulse 0  MEWS RR 0  MEWS LOC 0  MEWS Score 2  MEWS Score Color Yellow  Assess: if the MEWS score is Yellow or Red  Were vital signs accurate and taken at a resting state? Yes  Does the patient meet 2 or more of the SIRS criteria? No  MEWS guidelines implemented  Yes, yellow  Treat  MEWS Interventions Considered administering scheduled or prn medications/treatments as ordered  Take Vital Signs  Increase Vital Sign Frequency  Yellow: Q2hr x1, continue Q4hrs until patient remains green for 12hrs  Escalate  MEWS: Escalate Yellow: Discuss with charge nurse and consider notifying provider and/or RRT  Notify: Charge Nurse/RN  Name of Charge Nurse/RN Notified [place], Marvis Repress, RN  Provider Notification  Provider Name/Title A.Virgel Manifold, NP, Virgilio Belling, NP  Date Provider Notified 04/04/23  Time Provider Notified 2308  Method of Notification Page (secure chat)  Notification Reason Change in status (Yellow mews)  Provider response No new orders  Date of Provider Response 04/04/23  Time of Provider Response 2316  Assess: SIRS CRITERIA  SIRS Temperature  1  SIRS Pulse 0  SIRS Respirations  0  SIRS WBC 0  SIRS Score Sum  1

## 2023-04-04 NOTE — Progress Notes (Signed)
PT Cancellation Note  Patient Details Name: Bradley Brandt MRN: [id number] DOB: 1980-06-21   Cancelled Treatment:    Reason Eval/Treat Not Completed: Medical issues which prohibited therapy  Checked twice on pt.  Initially very lethargic.  In afternoon, more alert but elevated temp and HR.  Declines attempts to mobilize.  Pt reports w/c level baseline.  Will f/u as able.  Anise Salvo, PT Acute Rehab [facility] [phone]  Rayetta Humphrey 04/04/2023, 2:43 PM

## 2023-04-05 ENCOUNTER — Ambulatory Visit: Payer: Medicaid Other

## 2023-04-05 DIAGNOSIS — N10 Acute tubulo-interstitial nephritis: Secondary | ICD-10-CM | POA: Diagnosis not present

## 2023-04-05 LAB — CBC
HCT: 28.6 % — ABNORMAL LOW (ref 39.0–52.0)
Hemoglobin: 9.1 g/dL — ABNORMAL LOW (ref 13.0–17.0)
MCH: 31 pg (ref 26.0–34.0)
MCHC: 31.8 g/dL (ref 30.0–36.0)
MCV: 97.3 fL (ref 80.0–100.0)
Platelets: 245 10*3/uL (ref 150–400)
RBC: 2.94 MIL/uL — ABNORMAL LOW (ref 4.22–5.81)
RDW: 14.1 % (ref 11.5–15.5)
WBC: 13.4 10*3/uL — ABNORMAL HIGH (ref 4.0–10.5)
nRBC: 0 % (ref 0.0–0.2)

## 2023-04-05 LAB — COMPREHENSIVE METABOLIC PANEL
ALT: 43 U/L (ref 0–44)
AST: 39 U/L (ref 15–41)
Albumin: 2.1 g/dL — ABNORMAL LOW (ref 3.5–5.0)
Alkaline Phosphatase: 76 U/L (ref 38–126)
Anion gap: 10 (ref 5–15)
BUN: 20 mg/dL (ref 6–20)
CO2: 21 mmol/L — ABNORMAL LOW (ref 22–32)
Calcium: 8.1 mg/dL — ABNORMAL LOW (ref 8.9–10.3)
Chloride: 107 mmol/L (ref 98–111)
Creatinine, Ser: 1.27 mg/dL — ABNORMAL HIGH (ref 0.61–1.24)
GFR, Estimated: >60 mL/min (ref >60)
Glucose, Bld: 119 mg/dL — ABNORMAL HIGH (ref 70–99)
Potassium: 3.7 mmol/L (ref 3.5–5.1)
Sodium: 138 mmol/L (ref 135–145)
Total Bilirubin: 0.4 mg/dL (ref 0.3–1.2)
Total Protein: 6.2 g/dL — ABNORMAL LOW (ref 6.5–8.1)

## 2023-04-05 LAB — HEPATITIS PANEL, ACUTE
HCV Ab: NONREACTIVE
Hep A IgM: NONREACTIVE
Hep B C IgM: NONREACTIVE
Hepatitis B Surface Ag: NONREACTIVE

## 2023-04-05 LAB — MAGNESIUM: Magnesium: 2.1 mg/dL (ref 1.7–2.4)

## 2023-04-05 NOTE — Progress Notes (Signed)
Pharmacy Antibiotic Note  Bradley Brandt is a 42 y.o. male admitted on 04/03/2023 with  ESBL Ecoli bacteremia .  Pharmacy has been consulted for Merrem dosing.  ID: Ecoli bacteremia, Pyelonephritis  Tmax 102.6>>to 99.7 currently,  WBC 13.4 down,  Scr 1.82>>1.27  Antimicrobials this admission: 10/15 cefepime >> 10/16 10/16 meropenem >>   Dose adjustments this admission:  Microbiology results: 10/15 BCx: 4/4 GNR 10/16 BCID: Escherichia coli +ESBL 10/15 UCx: ip  - Recent UCx: 09/14/22 E faecalis 11/02/22 Ecoli and Enterobacter aerogenes 01/31/23 Enterobacter cloacae, Group B strep (S.agalactiae) 02/06/23: VRE.faecalis 02/20/23 multiple species  Plan: Merrem 1g IV q8hr ok for improving renal function Pharmacy will sign off. Please reconsult for further dosing assitance.     Height: 5\' 11"  (180.3 cm) Weight: 69.3 kg (152 lb 12.5 oz) IBW/kg (Calculated) : 75.3  Temp (24hrs), Avg:100.2 F (37.9 C), Min:97.9 F (36.6 C), Max:102.6 F (39.2 C)  Recent Labs  Lab 04/03/23 1550 04/03/23 1802 04/04/23 0337 04/05/23 0322  WBC 28.9*  --  23.1* 13.4*  CREATININE 1.76*  --  1.82* 1.27*  LATICACIDVEN  --  1.1  --   --     Estimated Creatinine Clearance: 72.8 mL/min (A) (by C-G formula based on SCr of 1.27 mg/dL (H)).    Allergies  Allergen Reactions   Lithium Rash    Kourtnee Lahey S. Merilynn Finland, PharmD, BCPS Clinical Staff Pharmacist [email or web address]  Pasty Spillers 04/05/2023 11:33 AM

## 2023-04-05 NOTE — Plan of Care (Signed)

## 2023-04-05 NOTE — Progress Notes (Addendum)
PROGRESS NOTE    Bradley Brandt  XBJ:[id number] DOB: 1981/05/03 DOA: 04/03/2023 PCP: Donell Beers, FNP   Brief Narrative:  42 y.o. male with medical history significant for multiple sclerosis and bipolar disorder presented with abdominal pain, nausea and vomiting worsening for the last 3 weeks.  On presentation, WBC was 28.9, creatinine of 1.76.  CT of abdomen and pelvis with contrast showed nonspecific bilateral renal findings which could reflect pyelonephritis, mild urinary bladder thickening and 1.5 cm hypoattenuating left upper lobe renal lesion.  He was started on IV fluids and antibiotics.  Assessment & Plan:   Possible acute pyelonephritis E. coli bacteremia -Imaging as above.  Continue broad-spectrum antibiotics.  Blood cultures growing E. coli.  Follow sensitivities.  Urine culture pending.    Leukocytosis -WBCs 28.9 on presentation.  Improving to 13.4 this morning.  Monitor.  AKI -Possibly from pyelonephritis.  Creatinine 1.76 on presentation.  Baseline creatinine of less than 1.  Creatinine improving to 1.27 today.  DC IV fluids.  Encourage oral intake.  Repeat a.m. labs  Normocytic anemia -Questionable cause.  Hemoglobin stable.  Monitor intermittently  Elevated LFTs -Resolved.  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  Patient was supposed to be getting Tysabri as an outpatient today at the infusion center: This has to be postponed because of current active bacteremia and pyelonephritis. -Outpatient follow-up with urology regarding suprapubic catheter care -PT eval.  Anxiety -Continue Cymbalta  DVT prophylaxis: Lovenox Code Status: Full Family Communication: None at bedside Disposition Plan: Status is: Inpatient Remains inpatient appropriate because: Of severity of illness.  Need for IV antibiotics   Consultants: None  Procedures:  None  Antimicrobials: Anti-infectives (From admission, onward)    Start     Dose/Rate Route Frequency Ordered Stop   04/04/23 1245  meropenem (MERREM) 1 g in sodium chloride 0.9 % 100 mL IVPB        1 g 200 mL/hr over 30 Minutes Intravenous Every 8 hours 04/04/23 1149     04/03/23 2200  ceFEPIme (MAXIPIME) 2 g in sodium chloride 0.9 % 100 mL IVPB  Status:  Discontinued        2 g 200 mL/hr over 30 Minutes Intravenous Every 8 hours 04/03/23 2042 04/04/23 1149        Subjective: Patient seen and examined at bedside.  Had fevers overnight.  No seizures, vomiting, abdominal pain reported. Objective: Vitals:   04/04/23 1543 04/04/23 2303 04/05/23 0002 04/05/23 0405  BP:  134/69 115/67 116/84  Pulse:   (!) 111 78  Resp:  18 16 15   Temp: 99.8 F (37.7 C) (!) 102.6 F (39.2 C) (!) 100.5 F (38.1 C) 97.9 F (36.6 C)  TempSrc:  Oral    SpO2:  97% 100% 99%  Weight:      Height:        Intake/Output Summary (Last 24 hours) at 04/05/2023 0738 Last data filed at 04/05/2023 0300 Gross per 24 hour  Intake 610 ml  Output 2400 ml  Net -1790 ml   Filed Weights   04/03/23 2339  Weight: 69.3 kg    Examination:  General: Currently on room air.  No acute distress ENT/neck: No thyromegaly.  JVD is not elevated  respiratory: Decreased breath sounds at bases bilaterally with some crackles; no wheezing  CVS: S1-S2 heard, mild intermittent tachycardia present  abdominal: Soft, nontender, slightly distended; no organomegaly, bowel sounds are heard Extremities: Trace lower extremity edema; no  cyanosis  CNS: Awake and alert.  Still slow to respond and a poor historian.  Weaker on the right side.  Speech is not that comprehensible  genitourinary: Suprapubic catheter present  lymph: No obvious lymphadenopathy Skin: No obvious ecchymosis/lesions  psych: Mostly flat affect.  Not agitated currently.   Musculoskeletal: No obvious joint swelling/deformity   Data Reviewed: I have personally  reviewed following labs and imaging studies  CBC: Recent Labs  Lab 04/03/23 1550 04/04/23 0337 04/05/23 0322  WBC 28.9* 23.1* 13.4*  NEUTROABS 24.1*  --   --   HGB 10.8* 9.9* 9.1*  HCT 32.5* 30.9* 28.6*  MCV 93.9 96.9 97.3  PLT 378 297 245   Basic Metabolic Panel: Recent Labs  Lab 04/03/23 1550 04/04/23 0337 04/05/23 0322  NA 137 144 138  K 4.8 4.6 3.7  CL 101 108 107  CO2 23 25 21*  GLUCOSE 125* 126* 119*  BUN 27* 25* 20  CREATININE 1.76* 1.82* 1.27*  CALCIUM 8.3* 8.3* 8.1*  MG  --   --  2.1   GFR: Estimated Creatinine Clearance: 72.8 mL/min (A) (by C-G formula based on SCr of 1.27 mg/dL (H)). Liver Function Tests: Recent Labs  Lab 04/03/23 1550 04/04/23 0337 04/05/23 0322  AST 54* 62* 39  ALT 49* 52* 43  ALKPHOS 93 85 76  BILITOT 1.0 0.9 0.4  PROT 7.8 7.0 6.2*  ALBUMIN 2.5* 2.3* 2.1*   No results for input(s): "LIPASE", "AMYLASE" in the last 168 hours. No results for input(s): "AMMONIA" in the last 168 hours. Coagulation Profile: No results for input(s): "INR", "PROTIME" in the last 168 hours. Cardiac Enzymes: No results for input(s): "CKTOTAL", "CKMB", "CKMBINDEX", "TROPONINI" in the last 168 hours. BNP (last 3 results) No results for input(s): "PROBNP" in the last 8760 hours. HbA1C: No results for input(s): "HGBA1C" in the last 72 hours. CBG: No results for input(s): "GLUCAP" in the last 168 hours. Lipid Profile: No results for input(s): "CHOL", "HDL", "LDLCALC", "TRIG", "CHOLHDL", "LDLDIRECT" in the last 72 hours. Thyroid Function Tests: No results for input(s): "TSH", "T4TOTAL", "FREET4", "T3FREE", "THYROIDAB" in the last 72 hours. Anemia Panel: No results for input(s): "VITAMINB12", "FOLATE", "FERRITIN", "TIBC", "IRON", "RETICCTPCT" in the last 72 hours. Sepsis Labs: Recent Labs  Lab 04/03/23 1802  LATICACIDVEN 1.1    Recent Results (from the past 240 hour(s))  Blood culture (routine x 2)     Status: None (Preliminary result)    Collection Time: 04/03/23  5:50 PM   Specimen: Site Not Specified; Blood  Result Value Ref Range Status   Specimen Description   Final    SITE NOT SPECIFIED BLOOD Performed at [facility], [address] [place], [place] [address]    Special Requests   Final    BOTTLES DRAWN AEROBIC AND ANAEROBIC Blood Culture adequate volume Performed at [facility], [address], [place], [place] [address]    Culture  Setup Time   Final    GRAM NEGATIVE RODS IN BOTH AEROBIC AND ANAEROBIC BOTTLES CRITICAL RESULT CALLED TO, READ BACK BY AND VERIFIED WITH: PHARMD T.GREEN AT 1138 ON 04/04/2023 BY T.SAAD. Performed at [facility], [address], [place], [place] [address]    Culture GRAM NEGATIVE RODS  Final   Report Status PENDING  Incomplete  Blood culture (routine x 2)     Status: None (Preliminary result)   Collection Time: 04/03/23  5:50 PM   Specimen: Site Not Specified; Blood  Result Value Ref  Range Status   Specimen Description   Final    SITE NOT SPECIFIED BLOOD Performed at [facility], [address] [place], [place] [address]    Special Requests   Final    BOTTLES DRAWN AEROBIC AND ANAEROBIC Blood Culture adequate volume Performed at [facility], [address], [place], [place] [address]    Culture  Setup Time   Final    GRAM NEGATIVE RODS IN BOTH AEROBIC AND ANAEROBIC BOTTLES CRITICAL VALUE NOTED.  VALUE IS CONSISTENT WITH PREVIOUSLY REPORTED AND CALLED VALUE. Performed at [facility], [address], [place], [place] [address]    Culture GRAM NEGATIVE RODS  Final   Report Status PENDING  Incomplete  Blood Culture ID Panel (Reflexed)     Status: Abnormal   Collection Time: 04/03/23  5:50 PM  Result Value Ref Range Status   Enterococcus faecalis NOT DETECTED NOT DETECTED Final   Enterococcus Faecium NOT DETECTED NOT DETECTED Final   Listeria monocytogenes NOT DETECTED NOT DETECTED Final   Staphylococcus  species NOT DETECTED NOT DETECTED Final   Staphylococcus aureus (BCID) NOT DETECTED NOT DETECTED Final   Staphylococcus epidermidis NOT DETECTED NOT DETECTED Final   Staphylococcus lugdunensis NOT DETECTED NOT DETECTED Final   Streptococcus species NOT DETECTED NOT DETECTED Final   Streptococcus agalactiae NOT DETECTED NOT DETECTED Final   Streptococcus pneumoniae NOT DETECTED NOT DETECTED Final   Streptococcus pyogenes NOT DETECTED NOT DETECTED Final   A.calcoaceticus-baumannii NOT DETECTED NOT DETECTED Final   Bacteroides fragilis NOT DETECTED NOT DETECTED Final   Enterobacterales DETECTED (A) NOT DETECTED Final    Comment: Enterobacterales represent a large order of gram negative bacteria, not a single organism. CRITICAL RESULT CALLED TO, READ BACK BY AND VERIFIED WITH: PHARMD T.GREEN AT 1138 ON 04/04/2023 BY T.SAAD.    Enterobacter cloacae complex NOT DETECTED NOT DETECTED Final   Escherichia coli DETECTED (A) NOT DETECTED Final    Comment: CRITICAL RESULT CALLED TO, READ BACK BY AND VERIFIED WITH: PHARMD T.GREEN AT 1138 ON 04/04/2023 BY T.SAAD.    Klebsiella aerogenes NOT DETECTED NOT DETECTED Final   Klebsiella oxytoca NOT DETECTED NOT DETECTED Final   Klebsiella pneumoniae NOT DETECTED NOT DETECTED Final   Proteus species NOT DETECTED NOT DETECTED Final   Salmonella species NOT DETECTED NOT DETECTED Final   Serratia marcescens NOT DETECTED NOT DETECTED Final   Haemophilus influenzae NOT DETECTED NOT DETECTED Final   Neisseria meningitidis NOT DETECTED NOT DETECTED Final   Pseudomonas aeruginosa NOT DETECTED NOT DETECTED Final   Stenotrophomonas maltophilia NOT DETECTED NOT DETECTED Final   Candida albicans NOT DETECTED NOT DETECTED Final   Candida auris NOT DETECTED NOT DETECTED Final   Candida glabrata NOT DETECTED NOT DETECTED Final   Candida krusei NOT DETECTED NOT DETECTED Final   Candida parapsilosis NOT DETECTED NOT DETECTED Final   Candida tropicalis NOT DETECTED NOT  DETECTED Final   Cryptococcus neoformans/gattii NOT DETECTED NOT DETECTED Final   CTX-M ESBL DETECTED (A) NOT DETECTED Final    Comment: CRITICAL RESULT CALLED TO, READ BACK BY AND VERIFIED WITH: PHARMD T.GREEN AT 1138 ON 04/04/2023 BY T.SAAD. (NOTE) Extended spectrum beta-lactamase detected. Recommend a carbapenem as initial therapy.      Carbapenem resistance IMP NOT DETECTED NOT DETECTED Final   Carbapenem resistance KPC NOT DETECTED NOT DETECTED Final   Carbapenem resistance NDM NOT DETECTED NOT DETECTED Final   Carbapenem resist OXA 48 LIKE NOT DETECTED NOT DETECTED Final   Carbapenem  resistance VIM NOT DETECTED NOT DETECTED Final    Comment: Performed at [facility], [address], [place], [place] [address]         Radiology Studies: CT ABDOMEN PELVIS W CONTRAST  Result Date: 04/03/2023 CLINICAL DATA:  Nonlocalized abdominal pain. History of multiple sclerosis. EXAM: CT ABDOMEN AND PELVIS WITH CONTRAST TECHNIQUE: Multidetector CT imaging of the abdomen and pelvis was performed using the standard protocol following bolus administration of intravenous contrast. RADIATION DOSE REDUCTION: This exam was performed according to the departmental dose-optimization program which includes automated exposure control, adjustment of the mA and/or kV according to patient size and/or use of iterative reconstruction technique. CONTRAST:  OMNIPAQUE IOHEXOL 300 MG/ML  SOLN COMPARISON:  09/14/2022 FINDINGS: Mild to moderate motion degradation throughout the abdomen and pelvis. Lower chest: No gross pulmonary abnormality. Normal heart size without pericardial or pleural effusion. Hepatobiliary: Normal liver. Normal gallbladder, without biliary ductal dilatation. Pancreas: Normal, without mass or ductal dilatation. Spleen: Normal in size, without focal abnormality. Adrenals/Urinary Tract: Normal adrenal glands. 1.5 cm upper pole left renal low-density lesion is not readily apparent on the  prior. Suboptimal corticomedullary differentiation is nonspecific and symmetric. 6 mm inter/lower pole left renal collecting system calculus. No hydronephrosis. Suprapubic catheter. The bladder wall is mildly thickened. Stomach/Bowel: Normal stomach, without wall thickening. Normal colon. Appendix not visualized. Normal small bowel. Vascular/Lymphatic: Aortic atherosclerosis. No abdominopelvic adenopathy. Reproductive: Normal prostate. Testicles positioned high in the inguinal canal, presumably transient given absence on the prior. Other: No significant free fluid. No abdominal ascites. No free intraperitoneal air. Musculoskeletal: No acute osseous abnormality. IMPRESSION: 1. Motion degraded exam. 2. Left nephrolithiasis without obstructive uropathy. 3. Suboptimal corticomedullary differentiation in both kidneys is nonspecific. Can be seen with renal insufficiency or pyelonephritis. 4. Suprapubic catheter in place with mild bladder wall thickening which can be seen with cystitis. 5. Upper pole left renal 1.5 cm hypoattenuating lesion is not readily apparent on the prior. Although this could represent an interval cyst or complex cyst, if the patient is diagnosed with left-sided pyelonephritis, focal infection (lobar nephronia) would be a concern. 6. Lack of visualization of the appendix. Electronically Signed   By: Jeronimo Greaves M.D.   On: 04/03/2023 19:55   DG Chest 1 View  Result Date: 04/03/2023 CLINICAL DATA:  dyspnea EXAM: CHEST  1 VIEW COMPARISON:  August 01, 2022 FINDINGS: The cardiomediastinal silhouette is unchanged in contour. No pleural effusion. No pneumothorax. No acute pleuroparenchymal abnormality. IMPRESSION: No acute cardiopulmonary abnormality. Electronically Signed   By: Meda Klinefelter M.D.   On: 04/03/2023 17:18        Scheduled Meds:  DULoxetine  30 mg Oral Daily   enoxaparin (LOVENOX) injection  40 mg Subcutaneous Q24H   gabapentin  400 mg Oral BID   oxybutynin  10 mg Oral  Daily   sodium chloride flush  3 mL Intravenous Q12H   Continuous Infusions:  dextrose 5 % and 0.9 % NaCl 100 mL/hr at 04/05/23 0124   meropenem (MERREM) IV 1 g (04/05/23 0543)          Glade Lloyd, MD [facility] 04/05/2023, 7:38 AM

## 2023-04-05 NOTE — Progress Notes (Signed)
PT Cancellation Note / Screen  Patient Details Name: Bradley Brandt MRN: [id number] DOB: 06/30/1980   Cancelled Treatment:    Reason Eval/Treat Not Completed: PT screened, no needs identified, will sign off Please refer to [facility] note for more information.  "Pt has been primarily bed and w/c bound for the past year. They have a hospital bed, hoyer lift, wheelchair, bedside commode and RW in the home already."  RN reports pt is assisting with rolling with upper body as able for linen change and hygiene care and likely appears at baseline.  Pt does not appear to require skilled acute PT at this time.    Kati L Payson 04/05/2023, 1:22 PM Paulino Door, DPT Physical Therapist Acute Rehabilitation Services Office: [phone]

## 2023-04-05 NOTE — Plan of Care (Signed)
Problem: Fluid Volume: Goal: Hemodynamic stability will improve Outcome: Progressing   Problem: Clinical Measurements: Goal: Diagnostic test results will improve Outcome: Progressing   Problem: Clinical Measurements: Goal: Signs and symptoms of infection will decrease Outcome: Progressing

## 2023-04-06 DIAGNOSIS — N10 Acute tubulo-interstitial nephritis: Secondary | ICD-10-CM | POA: Diagnosis not present

## 2023-04-06 LAB — CULTURE, BLOOD (ROUTINE X 2)
Special Requests: ADEQUATE
Special Requests: ADEQUATE

## 2023-04-06 LAB — CBC
HCT: 25.8 % — ABNORMAL LOW (ref 39.0–52.0)
Hemoglobin: 8.6 g/dL — ABNORMAL LOW (ref 13.0–17.0)
MCH: 31.5 pg (ref 26.0–34.0)
MCHC: 33.3 g/dL (ref 30.0–36.0)
MCV: 94.5 fL (ref 80.0–100.0)
Platelets: 234 10*3/uL (ref 150–400)
RBC: 2.73 MIL/uL — ABNORMAL LOW (ref 4.22–5.81)
RDW: 14 % (ref 11.5–15.5)
WBC: 7 10*3/uL (ref 4.0–10.5)
nRBC: 0 % (ref 0.0–0.2)

## 2023-04-06 LAB — COMPREHENSIVE METABOLIC PANEL
ALT: 36 U/L (ref 0–44)
AST: 31 U/L (ref 15–41)
Albumin: 1.8 g/dL — ABNORMAL LOW (ref 3.5–5.0)
Alkaline Phosphatase: 63 U/L (ref 38–126)
Anion gap: 8 (ref 5–15)
BUN: 17 mg/dL (ref 6–20)
CO2: 26 mmol/L (ref 22–32)
Calcium: 8.4 mg/dL — ABNORMAL LOW (ref 8.9–10.3)
Chloride: 104 mmol/L (ref 98–111)
Creatinine, Ser: 1.08 mg/dL (ref 0.61–1.24)
GFR, Estimated: >60 mL/min (ref >60)
Glucose, Bld: 94 mg/dL (ref 70–99)
Potassium: 3.6 mmol/L (ref 3.5–5.1)
Sodium: 138 mmol/L (ref 135–145)
Total Bilirubin: 0.5 mg/dL (ref 0.3–1.2)
Total Protein: 5.9 g/dL — ABNORMAL LOW (ref 6.5–8.1)

## 2023-04-06 LAB — MAGNESIUM: Magnesium: 1.6 mg/dL — ABNORMAL LOW (ref 1.7–2.4)

## 2023-04-06 MED ORDER — ENSURE ENLIVE PO LIQD
237.0000 mL | Freq: Three times a day (TID) | ORAL | Status: DC
  Administered 2023-04-06 – 2023-04-11 (×14): 237 mL via ORAL

## 2023-04-06 MED ORDER — ADULT MULTIVITAMIN W/MINERALS CH
1.0000 | ORAL_TABLET | Freq: Every day | ORAL | Status: DC
  Administered 2023-04-06 – 2023-04-11 (×6): 1 via ORAL
  Filled 2023-04-06 (×6): qty 1

## 2023-04-06 NOTE — Progress Notes (Signed)
Initial Nutrition Assessment  DOCUMENTATION CODES:   Non-severe (moderate) malnutrition in context of chronic illness  INTERVENTION:  - Regular diet.  - Ensure Plus High Protein po TID, each supplement provides 350 kcal and 20 grams of protein. - Add Multivitamin with minerals daily - Monitor weight trends.   NUTRITION DIAGNOSIS:   Moderate Malnutrition related to chronic illness as evidenced by moderate fat depletion, moderate muscle depletion, percent weight loss (10/% in 1 month).  GOAL:   Patient will meet greater than or equal to 90% of their needs  MONITOR:   PO intake, Supplement acceptance, Weight trends  REASON FOR ASSESSMENT:   Consult Assessment of nutrition requirement/status  ASSESSMENT:   42 y.o. male with PMH significant for multiple sclerosis and bipolar disorder who presented with abdominal pain, nausea and vomiting worsening for the last 3 weeks. Admitted for acute pyelonephritis.  Patient reports a UBW of 180# and denies any recent changes in weight.  Per EMR, patient has had a 17# or 10% weight loss in the past 1 month, which is significant for the time frame. Patient confirms this is likely accurate.  Shares he used to eat 3 meals a day but over the past few weeks he has only been eating 1 meal a day. Also drinks 1 Ensure daily. He is unsure why he has been eating less and losing weight as he reports normal appetite.   Current appetite is normal. Documented to be consuming 20-100% of meals. Agreeable to receive 3 Ensure a day to support intake.    Medications reviewed and include: -  Labs reviewed:  Phosphorus 1.6   NUTRITION - FOCUSED PHYSICAL EXAM:  Flowsheet Row Most Recent Value  Orbital Region Mild depletion  Upper Arm Region Moderate depletion  Thoracic and Lumbar Region Moderate depletion  Buccal Region Mild depletion  Temple Region Moderate depletion  Clavicle Bone Region Mild depletion  Clavicle and Acromion Bone Region Mild  depletion  Scapular Bone Region Unable to assess  Dorsal Hand Mild depletion  Patellar Region Moderate depletion  Anterior Thigh Region Moderate depletion  Posterior Calf Region Moderate depletion  Edema (RD Assessment) None  Hair Reviewed  Eyes Reviewed  Mouth Reviewed  Skin Reviewed  Nails Reviewed       Diet Order:   Diet Order             Diet regular Room service appropriate? Yes; Fluid consistency: Thin  Diet effective now                   EDUCATION NEEDS:  Education needs have been addressed  Skin:  Skin Assessment: Reviewed RN Assessment  Last BM:  unknown  Height:  Ht Readings from Last 1 Encounters:  04/03/23 5\' 11"  (1.803 m)   Weight:  Wt Readings from Last 1 Encounters:  04/03/23 69.3 kg   BMI:  Body mass index is 21.31 kg/m.  Estimated Nutritional Needs:  Kcal:  2100-2300 kcals Protein:  85-100 grams Fluid:  >/= 2.1L    Shelle Iron RD, LDN For contact information, refer to [facility].

## 2023-04-06 NOTE — Plan of Care (Signed)
[facility] Tonsillectomy/Adenoidectomy, Postoperative PEDS care plan entered in error.

## 2023-04-06 NOTE — Progress Notes (Signed)
PROGRESS NOTE    Bradley Brandt  WUJ:[id number] DOB: Jul 08, 1980 DOA: 04/03/2023 PCP: Donell Beers, FNP   Brief Narrative:  42 y.o. male with medical history significant for multiple sclerosis and bipolar disorder presented with abdominal pain, nausea and vomiting worsening for the last 3 weeks.  On presentation, WBC was 28.9, creatinine of 1.76.  CT of abdomen and pelvis with contrast showed nonspecific bilateral renal findings which could reflect pyelonephritis, mild urinary bladder thickening and 1.5 cm hypoattenuating left upper lobe renal lesion.  He was started on IV fluids and antibiotics.  He was found to have E. coli bacteremia  Assessment & Plan:   Possible acute pyelonephritis ESBL E. coli bacteremia -Imaging as above.  Continue meropenem.  Blood cultures growing ESBL E. coli.  Follow sensitivities.  Urine culture growing Enterococcus faecalis: Sensitivities pending -Tmax of 100.1 over the last 24 hours.  Leukocytosis -WBCs 28.9 on presentation.  Improved to 7 this morning.    AKI -Possibly from pyelonephritis.  Creatinine 1.76 on presentation.  Baseline creatinine of less than 1.  Creatinine improving to 1.08 today.  Off IV fluids.  Encourage oral intake.  Repeat a.m. labs  Hypomagnesemia -Replace.  Repeat a.m. labs  Hypoalbuminemia -Nutrition consult  Normocytic anemia -Questionable cause.  Hemoglobin stable.  Monitor intermittently  Elevated LFTs -Resolved.  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  Patient was supposed to be getting Tysabri as an outpatient today at the infusion center: This has to be postponed because of current active bacteremia and pyelonephritis. -Outpatient follow-up with urology regarding suprapubic catheter care -PT eval.  Anxiety -Continue Cymbalta  DVT prophylaxis: Lovenox Code Status: Full Family Communication: None at bedside Disposition  Plan: Status is: Inpatient Remains inpatient appropriate because: Of severity of illness.  Need for IV antibiotics   Consultants: None  Procedures: None  Antimicrobials: Anti-infectives (From admission, onward)    Start     Dose/Rate Route Frequency Ordered Stop   04/04/23 1245  meropenem (MERREM) 1 g in sodium chloride 0.9 % 100 mL IVPB        1 g 200 mL/hr over 30 Minutes Intravenous Every 8 hours 04/04/23 1149     04/03/23 2200  ceFEPIme (MAXIPIME) 2 g in sodium chloride 0.9 % 100 mL IVPB  Status:  Discontinued        2 g 200 mL/hr over 30 Minutes Intravenous Every 8 hours 04/03/23 2042 04/04/23 1149        Subjective: Patient seen and examined at bedside.  Denies worsening abdominal pain, vomiting or shortness of breath.   Objective: Vitals:   04/05/23 1200 04/05/23 1735 04/05/23 1951 04/06/23 0514  BP: 123/77 130/78 122/71 124/80  Pulse: 97 88 (!) 102 99  Resp: 18 18 16 16   Temp: 98.8 F (37.1 C) 99.7 F (37.6 C) 100.1 F (37.8 C) 98.8 F (37.1 C)  TempSrc: Oral  Oral Oral  SpO2: 97% 99% 97% 96%  Weight:      Height:        Intake/Output Summary (Last 24 hours) at 04/06/2023 0746 Last data filed at 04/06/2023 0658 Gross per 24 hour  Intake 4522.5 ml  Output 3375 ml  Net 1147.5 ml   Filed Weights   04/03/23 2339  Weight: 69.3 kg    Examination:  General: No distress.  On room air currently.   ENT/neck: No obvious JVD elevation or palpable neck masses noted respiratory: Bilateral decreased breath sounds at  bases with scattered crackles CVS: Intermittently tachycardic; S1 and S2 are heard  abdominal: Soft, nontender, distended mildly; no organomegaly, bowel sounds are heard normally Extremities: No clubbing; mild lower extremity edema present CNS: Awake.  Poor historian.  Slow to respond.  Weaker on the right side.  Speech is not that comprehensible  genitourinary: Suprapubic catheter is still present  lymph: No palpable lymphadenopathy Skin: No  obvious petechiae/rashes  psych: Not agitated.  Flat affect mostly.   Musculoskeletal: No obvious joint erythema/tenderness   Data Reviewed: I have personally reviewed following labs and imaging studies  CBC: Recent Labs  Lab 04/03/23 1550 04/04/23 0337 04/05/23 0322 04/06/23 0319  WBC 28.9* 23.1* 13.4* 7.0  NEUTROABS 24.1*  --   --   --   HGB 10.8* 9.9* 9.1* 8.6*  HCT 32.5* 30.9* 28.6* 25.8*  MCV 93.9 96.9 97.3 94.5  PLT 378 297 245 234   Basic Metabolic Panel: Recent Labs  Lab 04/03/23 1550 04/04/23 0337 04/05/23 0322 04/06/23 0319  NA 137 144 138 138  K 4.8 4.6 3.7 3.6  CL 101 108 107 104  CO2 23 25 21* 26  GLUCOSE 125* 126* 119* 94  BUN 27* 25* 20 17  CREATININE 1.76* 1.82* 1.27* 1.08  CALCIUM 8.3* 8.3* 8.1* 8.4*  MG  --   --  2.1 1.6*   GFR: Estimated Creatinine Clearance: 85.6 mL/min (by C-G formula based on SCr of 1.08 mg/dL). Liver Function Tests: Recent Labs  Lab 04/03/23 1550 04/04/23 0337 04/05/23 0322 04/06/23 0319  AST 54* 62* 39 31  ALT 49* 52* 43 36  ALKPHOS 93 85 76 63  BILITOT 1.0 0.9 0.4 0.5  PROT 7.8 7.0 6.2* 5.9*  ALBUMIN 2.5* 2.3* 2.1* 1.8*   No results for input(s): "LIPASE", "AMYLASE" in the last 168 hours. No results for input(s): "AMMONIA" in the last 168 hours. Coagulation Profile: No results for input(s): "INR", "PROTIME" in the last 168 hours. Cardiac Enzymes: No results for input(s): "CKTOTAL", "CKMB", "CKMBINDEX", "TROPONINI" in the last 168 hours. BNP (last 3 results) No results for input(s): "PROBNP" in the last 8760 hours. HbA1C: No results for input(s): "HGBA1C" in the last 72 hours. CBG: No results for input(s): "GLUCAP" in the last 168 hours. Lipid Profile: No results for input(s): "CHOL", "HDL", "LDLCALC", "TRIG", "CHOLHDL", "LDLDIRECT" in the last 72 hours. Thyroid Function Tests: No results for input(s): "TSH", "T4TOTAL", "FREET4", "T3FREE", "THYROIDAB" in the last 72 hours. Anemia Panel: No results for  input(s): "VITAMINB12", "FOLATE", "FERRITIN", "TIBC", "IRON", "RETICCTPCT" in the last 72 hours. Sepsis Labs: Recent Labs  Lab 04/03/23 1802  LATICACIDVEN 1.1    Recent Results (from the past 240 hour(s))  Blood culture (routine x 2)     Status: Abnormal (Preliminary result)   Collection Time: 04/03/23  5:50 PM   Specimen: Site Not Specified; Blood  Result Value Ref Range Status   Specimen Description   Final    SITE NOT SPECIFIED BLOOD Performed at [facility], [address] [place], [place] [address]    Special Requests   Final    BOTTLES DRAWN AEROBIC AND ANAEROBIC Blood Culture adequate volume Performed at [facility], [address], [place], [place] [address]    Culture  Setup Time   Final    GRAM NEGATIVE RODS IN BOTH AEROBIC AND ANAEROBIC BOTTLES CRITICAL RESULT CALLED TO, READ BACK BY AND VERIFIED WITH: PHARMD T.GREEN AT 1138 ON 04/04/2023 BY T.SAAD.    Culture (A)  Final  ESCHERICHIA COLI SUSCEPTIBILITIES TO FOLLOW Performed at [facility], [address] [place], [place] [address]    Report Status PENDING  Incomplete  Blood culture (routine x 2)     Status: None (Preliminary result)   Collection Time: 04/03/23  5:50 PM   Specimen: Site Not Specified; Blood  Result Value Ref Range Status   Specimen Description   Final    SITE NOT SPECIFIED BLOOD Performed at [facility], [address] [place], [place] [address]    Special Requests   Final    BOTTLES DRAWN AEROBIC AND ANAEROBIC Blood Culture adequate volume Performed at [facility], [address], [place], [place] [address]    Culture  Setup Time   Final    GRAM NEGATIVE RODS IN BOTH AEROBIC AND ANAEROBIC BOTTLES CRITICAL VALUE NOTED.  VALUE IS CONSISTENT WITH PREVIOUSLY REPORTED AND CALLED VALUE.    Culture   Final    GRAM NEGATIVE RODS IDENTIFICATION TO FOLLOW Performed at [facility], [address], [place], [place] [address]     Report Status PENDING  Incomplete  Blood Culture ID Panel (Reflexed)     Status: Abnormal   Collection Time: 04/03/23  5:50 PM  Result Value Ref Range Status   Enterococcus faecalis NOT DETECTED NOT DETECTED Final   Enterococcus Faecium NOT DETECTED NOT DETECTED Final   Listeria monocytogenes NOT DETECTED NOT DETECTED Final   Staphylococcus species NOT DETECTED NOT DETECTED Final   Staphylococcus aureus (BCID) NOT DETECTED NOT DETECTED Final   Staphylococcus epidermidis NOT DETECTED NOT DETECTED Final   Staphylococcus lugdunensis NOT DETECTED NOT DETECTED Final   Streptococcus species NOT DETECTED NOT DETECTED Final   Streptococcus agalactiae NOT DETECTED NOT DETECTED Final   Streptococcus pneumoniae NOT DETECTED NOT DETECTED Final   Streptococcus pyogenes NOT DETECTED NOT DETECTED Final   A.calcoaceticus-baumannii NOT DETECTED NOT DETECTED Final   Bacteroides fragilis NOT DETECTED NOT DETECTED Final   Enterobacterales DETECTED (A) NOT DETECTED Final    Comment: Enterobacterales represent a large order of gram negative bacteria, not a single organism. CRITICAL RESULT CALLED TO, READ BACK BY AND VERIFIED WITH: PHARMD T.GREEN AT 1138 ON 04/04/2023 BY T.SAAD.    Enterobacter cloacae complex NOT DETECTED NOT DETECTED Final   Escherichia coli DETECTED (A) NOT DETECTED Final    Comment: CRITICAL RESULT CALLED TO, READ BACK BY AND VERIFIED WITH: PHARMD T.GREEN AT 1138 ON 04/04/2023 BY T.SAAD.    Klebsiella aerogenes NOT DETECTED NOT DETECTED Final   Klebsiella oxytoca NOT DETECTED NOT DETECTED Final   Klebsiella pneumoniae NOT DETECTED NOT DETECTED Final   Proteus species NOT DETECTED NOT DETECTED Final   Salmonella species NOT DETECTED NOT DETECTED Final   Serratia marcescens NOT DETECTED NOT DETECTED Final   Haemophilus influenzae NOT DETECTED NOT DETECTED Final   Neisseria meningitidis NOT DETECTED NOT DETECTED Final   Pseudomonas aeruginosa NOT DETECTED NOT DETECTED Final    Stenotrophomonas maltophilia NOT DETECTED NOT DETECTED Final   Candida albicans NOT DETECTED NOT DETECTED Final   Candida auris NOT DETECTED NOT DETECTED Final   Candida glabrata NOT DETECTED NOT DETECTED Final   Candida krusei NOT DETECTED NOT DETECTED Final   Candida parapsilosis NOT DETECTED NOT DETECTED Final   Candida tropicalis NOT DETECTED NOT DETECTED Final   Cryptococcus neoformans/gattii NOT DETECTED NOT DETECTED Final   CTX-M ESBL DETECTED (A) NOT DETECTED Final    Comment: CRITICAL RESULT CALLED TO, READ BACK BY AND VERIFIED WITH:  PHARMD T.GREEN AT 1138 ON 04/04/2023 BY T.SAAD. (NOTE) Extended spectrum beta-lactamase detected. Recommend a carbapenem as initial therapy.      Carbapenem resistance IMP NOT DETECTED NOT DETECTED Final   Carbapenem resistance KPC NOT DETECTED NOT DETECTED Final   Carbapenem resistance NDM NOT DETECTED NOT DETECTED Final   Carbapenem resist OXA 48 LIKE NOT DETECTED NOT DETECTED Final   Carbapenem resistance VIM NOT DETECTED NOT DETECTED Final    Comment: Performed at [facility], [address], [place], [place] [address]  Urine Culture (for pregnant, neutropenic or urologic patients or patients with an indwelling urinary catheter)     Status: Abnormal (Preliminary result)   Collection Time: 04/04/23 11:30 AM   Specimen: Urine, Clean Catch  Result Value Ref Range Status   Specimen Description   Final    URINE, CLEAN CATCH Performed at [facility], [address], [place], [place] [address]    Special Requests   Final    NONE Performed at [facility], [address] [place], [place] [address]    Culture 70,000 COLONIES/mL ENTEROCOCCUS FAECALIS (A)  Final   Report Status PENDING  Incomplete         Radiology Studies: No results found.      Scheduled Meds:  DULoxetine  30 mg Oral Daily   enoxaparin (LOVENOX) injection  40 mg Subcutaneous Q24H   gabapentin  400 mg Oral BID    oxybutynin  10 mg Oral Daily   sodium chloride flush  3 mL Intravenous Q12H   Continuous Infusions:  meropenem (MERREM) IV 1 g (04/06/23 1324)          Glade Lloyd, MD [facility] 04/06/2023, 7:46 AM

## 2023-04-07 DIAGNOSIS — N10 Acute tubulo-interstitial nephritis: Secondary | ICD-10-CM | POA: Diagnosis not present

## 2023-04-07 LAB — BASIC METABOLIC PANEL
Anion gap: 7 (ref 5–15)
BUN: 21 mg/dL — ABNORMAL HIGH (ref 6–20)
CO2: 26 mmol/L (ref 22–32)
Calcium: 8.4 mg/dL — ABNORMAL LOW (ref 8.9–10.3)
Chloride: 105 mmol/L (ref 98–111)
Creatinine, Ser: 0.85 mg/dL (ref 0.61–1.24)
GFR, Estimated: >60 mL/min (ref >60)
Glucose, Bld: 103 mg/dL — ABNORMAL HIGH (ref 70–99)
Potassium: 4.5 mmol/L (ref 3.5–5.1)
Sodium: 138 mmol/L (ref 135–145)

## 2023-04-07 LAB — MAGNESIUM: Magnesium: 1.8 mg/dL (ref 1.7–2.4)

## 2023-04-07 LAB — CBC WITH DIFFERENTIAL/PLATELET
Abs Immature Granulocytes: 0.02 10*3/uL (ref 0.00–0.07)
Basophils Absolute: 0 10*3/uL (ref 0.0–0.1)
Basophils Relative: 1 %
Eosinophils Absolute: 0.1 10*3/uL (ref 0.0–0.5)
Eosinophils Relative: 1 %
HCT: 26.7 % — ABNORMAL LOW (ref 39.0–52.0)
Hemoglobin: 8.6 g/dL — ABNORMAL LOW (ref 13.0–17.0)
Immature Granulocytes: 0 %
Lymphocytes Relative: 45 %
Lymphs Abs: 2.2 10*3/uL (ref 0.7–4.0)
MCH: 30.6 pg (ref 26.0–34.0)
MCHC: 32.2 g/dL (ref 30.0–36.0)
MCV: 95 fL (ref 80.0–100.0)
Monocytes Absolute: 0.6 10*3/uL (ref 0.1–1.0)
Monocytes Relative: 12 %
Neutro Abs: 2 10*3/uL (ref 1.7–7.7)
Neutrophils Relative %: 41 %
Platelets: 221 10*3/uL (ref 150–400)
RBC: 2.81 MIL/uL — ABNORMAL LOW (ref 4.22–5.81)
RDW: 13.8 % (ref 11.5–15.5)
WBC: 4.7 10*3/uL (ref 4.0–10.5)
nRBC: 0 % (ref 0.0–0.2)

## 2023-04-07 NOTE — Progress Notes (Signed)
PROGRESS NOTE    Bradley Brandt  ZOX:[id number] DOB: 08-Jan-1981 DOA: 04/03/2023 PCP: Donell Beers, FNP   Brief Narrative:  42 y.o. male with medical history significant for multiple sclerosis and bipolar disorder presented with abdominal pain, nausea and vomiting worsening for the last 3 weeks.  On presentation, WBC was 28.9, creatinine of 1.76.  CT of abdomen and pelvis with contrast showed nonspecific bilateral renal findings which could reflect pyelonephritis, mild urinary bladder thickening and 1.5 cm hypoattenuating left upper lobe renal lesion.  He was started on IV fluids and antibiotics.  He was found to have E. coli bacteremia  Assessment & Plan:   Possible acute pyelonephritis ESBL E. coli bacteremia -Imaging as above.  Continue meropenem; will possibly need at least 7 days of IV antibiotics.  Blood cultures growing ESBL E. coli.  Follow sensitivities.  Urine culture growing Enterococcus faecalis: Possibly colonizers. -No temperature spikes over the last 24 hours.  Leukocytosis -Resolved  AKI -Possibly from pyelonephritis.  Creatinine 1.76 on presentation.  Baseline creatinine of less than 1.  Creatinine improving to 0.85 today.  Off IV fluids.  Encourage oral intake.   Hypomagnesemia -Improved  Hypoalbuminemia Moderate malnutrition -Follow nutrition recommendations  Normocytic anemia -Questionable cause.  Hemoglobin stable.  Monitor intermittently  Elevated LFTs -Resolved.  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  Patient was supposed to be getting Tysabri as an outpatient today at the infusion center: This has to be postponed because of current active bacteremia and pyelonephritis. -Outpatient follow-up with urology regarding suprapubic catheter care -PT eval.  Anxiety -Continue Cymbalta  DVT prophylaxis: Lovenox Code Status: Full Family Communication: None at  bedside Disposition Plan: Status is: Inpatient Remains inpatient appropriate because: Of severity of illness.  Need for IV antibiotics   Consultants: None  Procedures: None  Antimicrobials: Anti-infectives (From admission, onward)    Start     Dose/Rate Route Frequency Ordered Stop   04/04/23 1245  meropenem (MERREM) 1 g in sodium chloride 0.9 % 100 mL IVPB        1 g 200 mL/hr over 30 Minutes Intravenous Every 8 hours 04/04/23 1149     04/03/23 2200  ceFEPIme (MAXIPIME) 2 g in sodium chloride 0.9 % 100 mL IVPB  Status:  Discontinued        2 g 200 mL/hr over 30 Minutes Intravenous Every 8 hours 04/03/23 2042 04/04/23 1149        Subjective: Patient seen and examined at bedside.  No fever, vomiting, seizures reported.  Poor historian.   Objective: Vitals:   04/06/23 0514 04/06/23 1404 04/06/23 2033 04/07/23 0425  BP: 124/80 113/67 110/64 104/62  Pulse: 99 (!) 103 92 63  Resp: 16 16 19 18   Temp: 98.8 F (37.1 C) 98.1 F (36.7 C) 99.3 F (37.4 C) (!) 97.4 F (36.3 C)  TempSrc: Oral Oral Oral Oral  SpO2: 96% 100% 97% 98%  Weight:      Height:        Intake/Output Summary (Last 24 hours) at 04/07/2023 0808 Last data filed at 04/07/2023 0400 Gross per 24 hour  Intake 920 ml  Output 2000 ml  Net -1080 ml   Filed Weights   04/03/23 2339  Weight: 69.3 kg    Examination:  General: Currently on room air.  No acute distress.   ENT/neck: No thyromegaly or elevated JVD noted  respiratory: Decreased breath sounds at bases bilaterally with some crackles  CVS:  S1-S2 heard; rate currently controlled  abdominal: Soft, nontender, has some mild send; no organomegaly, normal bowel sounds heard  extremities: Trace lower extremity edema; no cyanosis  CNS: Alert; still slow to respond.  Poor historian.  Weaker on the right side.  Speech is not that comprehensible  genitourinary: Suprapubic catheter present  lymph: No cervical lymphadenopathy Skin: No obvious  ecchymosis/lesions  psych: Affect is mostly flat.  Currently not agitated. Musculoskeletal: No obvious joint tenderness/deformity  Data Reviewed: I have personally reviewed following labs and imaging studies  CBC: Recent Labs  Lab 04/03/23 1550 04/04/23 0337 04/05/23 0322 04/06/23 0319 04/07/23 0333  WBC 28.9* 23.1* 13.4* 7.0 4.7  NEUTROABS 24.1*  --   --   --  2.0  HGB 10.8* 9.9* 9.1* 8.6* 8.6*  HCT 32.5* 30.9* 28.6* 25.8* 26.7*  MCV 93.9 96.9 97.3 94.5 95.0  PLT 378 297 245 234 221   Basic Metabolic Panel: Recent Labs  Lab 04/03/23 1550 04/04/23 0337 04/05/23 0322 04/06/23 0319 04/07/23 0333  NA 137 144 138 138 138  K 4.8 4.6 3.7 3.6 4.5  CL 101 108 107 104 105  CO2 23 25 21* 26 26  GLUCOSE 125* 126* 119* 94 103*  BUN 27* 25* 20 17 21*  CREATININE 1.76* 1.82* 1.27* 1.08 0.85  CALCIUM 8.3* 8.3* 8.1* 8.4* 8.4*  MG  --   --  2.1 1.6* 1.8   GFR: Estimated Creatinine Clearance: 108.7 mL/min (by C-G formula based on SCr of 0.85 mg/dL). Liver Function Tests: Recent Labs  Lab 04/03/23 1550 04/04/23 0337 04/05/23 0322 04/06/23 0319  AST 54* 62* 39 31  ALT 49* 52* 43 36  ALKPHOS 93 85 76 63  BILITOT 1.0 0.9 0.4 0.5  PROT 7.8 7.0 6.2* 5.9*  ALBUMIN 2.5* 2.3* 2.1* 1.8*   No results for input(s): "LIPASE", "AMYLASE" in the last 168 hours. No results for input(s): "AMMONIA" in the last 168 hours. Coagulation Profile: No results for input(s): "INR", "PROTIME" in the last 168 hours. Cardiac Enzymes: No results for input(s): "CKTOTAL", "CKMB", "CKMBINDEX", "TROPONINI" in the last 168 hours. BNP (last 3 results) No results for input(s): "PROBNP" in the last 8760 hours. HbA1C: No results for input(s): "HGBA1C" in the last 72 hours. CBG: No results for input(s): "GLUCAP" in the last 168 hours. Lipid Profile: No results for input(s): "CHOL", "HDL", "LDLCALC", "TRIG", "CHOLHDL", "LDLDIRECT" in the last 72 hours. Thyroid Function Tests: No results for input(s): "TSH",  "T4TOTAL", "FREET4", "T3FREE", "THYROIDAB" in the last 72 hours. Anemia Panel: No results for input(s): "VITAMINB12", "FOLATE", "FERRITIN", "TIBC", "IRON", "RETICCTPCT" in the last 72 hours. Sepsis Labs: Recent Labs  Lab 04/03/23 1802  LATICACIDVEN 1.1    Recent Results (from the past 240 hour(s))  Blood culture (routine x 2)     Status: Abnormal   Collection Time: 04/03/23  5:50 PM   Specimen: Site Not Specified; Blood  Result Value Ref Range Status   Specimen Description   Final    SITE NOT SPECIFIED BLOOD Performed at [facility], [address], [place], [place] [address]    Special Requests   Final    BOTTLES DRAWN AEROBIC AND ANAEROBIC Blood Culture adequate volume Performed at [facility], [address], [place], [place] [address]    Culture  Setup Time   Final    GRAM NEGATIVE RODS IN BOTH AEROBIC AND ANAEROBIC BOTTLES CRITICAL RESULT CALLED TO, READ BACK BY AND VERIFIED WITH: PHARMD T.GREEN AT 1138 ON 04/04/2023  BY T.SAAD. Performed at [facility], [address], [place], [place] [address]    Culture (A)  Final    ESCHERICHIA COLI Confirmed Extended Spectrum Beta-Lactamase Producer (ESBL).  In bloodstream infections from ESBL organisms, carbapenems are preferred over piperacillin/tazobactam. They are shown to have a lower risk of mortality.    Report Status 04/06/2023 FINAL  Final   Organism ID, Bacteria ESCHERICHIA COLI  Final      Susceptibility   Escherichia coli - MIC*    AMPICILLIN >=32 RESISTANT Resistant     CEFEPIME >=32 RESISTANT Resistant     CEFTAZIDIME RESISTANT Resistant     CEFTRIAXONE >=64 RESISTANT Resistant     CIPROFLOXACIN >=4 RESISTANT Resistant     GENTAMICIN <=1 SENSITIVE Sensitive     IMIPENEM <=0.25 SENSITIVE Sensitive     TRIMETH/SULFA <=20 SENSITIVE Sensitive     AMPICILLIN/SULBACTAM 16 INTERMEDIATE Intermediate     PIP/TAZO <=4 SENSITIVE Sensitive ug/mL    * ESCHERICHIA COLI  Blood culture (routine  x 2)     Status: Abnormal   Collection Time: 04/03/23  5:50 PM   Specimen: Site Not Specified; Blood  Result Value Ref Range Status   Specimen Description   Final    SITE NOT SPECIFIED BLOOD Performed at [facility], [address], [place], [place] [address]    Special Requests   Final    BOTTLES DRAWN AEROBIC AND ANAEROBIC Blood Culture adequate volume Performed at [facility], [address], [place], [place] [address]    Culture  Setup Time   Final    GRAM NEGATIVE RODS IN BOTH AEROBIC AND ANAEROBIC BOTTLES CRITICAL VALUE NOTED.  VALUE IS CONSISTENT WITH PREVIOUSLY REPORTED AND CALLED VALUE. Performed at [facility], [address] [place], [place] [address]    Culture ESCHERICHIA COLI (A)  Final   Report Status 04/06/2023 FINAL  Final  Blood Culture ID Panel (Reflexed)     Status: Abnormal   Collection Time: 04/03/23  5:50 PM  Result Value Ref Range Status   Enterococcus faecalis NOT DETECTED NOT DETECTED Final   Enterococcus Faecium NOT DETECTED NOT DETECTED Final   Listeria monocytogenes NOT DETECTED NOT DETECTED Final   Staphylococcus species NOT DETECTED NOT DETECTED Final   Staphylococcus aureus (BCID) NOT DETECTED NOT DETECTED Final   Staphylococcus epidermidis NOT DETECTED NOT DETECTED Final   Staphylococcus lugdunensis NOT DETECTED NOT DETECTED Final   Streptococcus species NOT DETECTED NOT DETECTED Final   Streptococcus agalactiae NOT DETECTED NOT DETECTED Final   Streptococcus pneumoniae NOT DETECTED NOT DETECTED Final   Streptococcus pyogenes NOT DETECTED NOT DETECTED Final   A.calcoaceticus-baumannii NOT DETECTED NOT DETECTED Final   Bacteroides fragilis NOT DETECTED NOT DETECTED Final   Enterobacterales DETECTED (A) NOT DETECTED Final    Comment: Enterobacterales represent a large order of gram negative bacteria, not a single organism. CRITICAL RESULT CALLED TO, READ BACK BY AND VERIFIED WITH: PHARMD T.GREEN AT 1138 ON  04/04/2023 BY T.SAAD.    Enterobacter cloacae complex NOT DETECTED NOT DETECTED Final   Escherichia coli DETECTED (A) NOT DETECTED Final    Comment: CRITICAL RESULT CALLED TO, READ BACK BY AND VERIFIED WITH: PHARMD T.GREEN AT 1138 ON 04/04/2023 BY T.SAAD.    Klebsiella aerogenes NOT DETECTED NOT DETECTED Final   Klebsiella oxytoca NOT DETECTED NOT DETECTED Final   Klebsiella pneumoniae NOT DETECTED NOT DETECTED Final   Proteus species NOT DETECTED NOT DETECTED Final   Salmonella species NOT  DETECTED NOT DETECTED Final   Serratia marcescens NOT DETECTED NOT DETECTED Final   Haemophilus influenzae NOT DETECTED NOT DETECTED Final   Neisseria meningitidis NOT DETECTED NOT DETECTED Final   Pseudomonas aeruginosa NOT DETECTED NOT DETECTED Final   Stenotrophomonas maltophilia NOT DETECTED NOT DETECTED Final   Candida albicans NOT DETECTED NOT DETECTED Final   Candida auris NOT DETECTED NOT DETECTED Final   Candida glabrata NOT DETECTED NOT DETECTED Final   Candida krusei NOT DETECTED NOT DETECTED Final   Candida parapsilosis NOT DETECTED NOT DETECTED Final   Candida tropicalis NOT DETECTED NOT DETECTED Final   Cryptococcus neoformans/gattii NOT DETECTED NOT DETECTED Final   CTX-M ESBL DETECTED (A) NOT DETECTED Final    Comment: CRITICAL RESULT CALLED TO, READ BACK BY AND VERIFIED WITH: PHARMD T.GREEN AT 1138 ON 04/04/2023 BY T.SAAD. (NOTE) Extended spectrum beta-lactamase detected. Recommend a carbapenem as initial therapy.      Carbapenem resistance IMP NOT DETECTED NOT DETECTED Final   Carbapenem resistance KPC NOT DETECTED NOT DETECTED Final   Carbapenem resistance NDM NOT DETECTED NOT DETECTED Final   Carbapenem resist OXA 48 LIKE NOT DETECTED NOT DETECTED Final   Carbapenem resistance VIM NOT DETECTED NOT DETECTED Final    Comment: Performed at [facility], [address] [place], [place] [address]  Urine Culture (for pregnant, neutropenic or urologic patients or patients  with an indwelling urinary catheter)     Status: Abnormal (Preliminary result)   Collection Time: 04/04/23 11:30 AM   Specimen: Urine, Clean Catch  Result Value Ref Range Status   Specimen Description   Final    URINE, CLEAN CATCH Performed at [facility], [address] [place], [place] [address]    Special Requests   Final    NONE Performed at [facility], [address] [place], [place] [address]    Culture (A)  Final    70,000 COLONIES/mL ENTEROCOCCUS FAECALIS CULTURE REINCUBATED FOR BETTER GROWTH SUSCEPTIBILITIES TO FOLLOW Performed at [facility], [address] [place], [place] [address]    Report Status PENDING  Incomplete         Radiology Studies: No results found.      Scheduled Meds:  DULoxetine  30 mg Oral Daily   enoxaparin (LOVENOX) injection  40 mg Subcutaneous Q24H   feeding supplement  237 mL Oral TID BM   gabapentin  400 mg Oral BID   multivitamin with minerals  1 tablet Oral Daily   oxybutynin  10 mg Oral Daily   sodium chloride flush  3 mL Intravenous Q12H   Continuous Infusions:  meropenem (MERREM) IV 1 g (04/07/23 4270)          Glade Lloyd, MD [facility] 04/07/2023, 8:08 AM

## 2023-04-07 NOTE — Plan of Care (Signed)
  Problem: Fluid Volume: Goal: Hemodynamic stability will improve Outcome: Completed/Met   Problem: Clinical Measurements: Goal: Diagnostic test results will improve Outcome: Progressing Goal: Signs and symptoms of infection will decrease Outcome: Progressing   Problem: Respiratory: Goal: Ability to maintain adequate ventilation will improve Outcome: Completed/Met   Problem: Education: Goal: Knowledge of General Education information will improve Description: Including pain rating scale, medication(s)/side effects and non-pharmacologic comfort measures Outcome: Adequate for Discharge   Problem: Health Behavior/Discharge Planning: Goal: Ability to manage health-related needs will improve Outcome: Progressing   Problem: Clinical Measurements: Goal: Ability to maintain clinical measurements within normal limits will improve Outcome: Progressing Goal: Will remain free from infection Outcome: Progressing Goal: Diagnostic test results will improve Outcome: Progressing Goal: Respiratory complications will improve Outcome: Progressing Goal: Cardiovascular complication will be avoided Outcome: Progressing   Problem: Activity: Goal: Risk for activity intolerance will decrease Outcome: Not Applicable   Problem: Nutrition: Goal: Adequate nutrition will be maintained Outcome: Completed/Met   Problem: Coping: Goal: Level of anxiety will decrease Outcome: Progressing   Problem: Elimination: Goal: Will not experience complications related to bowel motility Outcome: Progressing Goal: Will not experience complications related to urinary retention Outcome: Completed/Met   Problem: Pain Managment: Goal: General experience of comfort will improve Outcome: Progressing   Problem: Safety: Goal: Ability to remain free from injury will improve Outcome: Progressing   Problem: Skin Integrity: Goal: Risk for impaired skin integrity will decrease Outcome: Progressing

## 2023-04-07 NOTE — Plan of Care (Signed)
Problem: Clinical Measurements: Goal: Diagnostic test results will improve Outcome: Progressing   Problem: Education: Goal: Knowledge of General Education information will improve Description: Including pain rating scale, medication(s)/side effects and non-pharmacologic comfort measures Outcome: Progressing  Problem: Safety: Goal: Ability to remain free from injury will improve Outcome: Progressing   Haydee Salter, RN 04/07/23 7:49 PM

## 2023-04-08 DIAGNOSIS — N10 Acute tubulo-interstitial nephritis: Secondary | ICD-10-CM | POA: Diagnosis not present

## 2023-04-08 NOTE — Progress Notes (Signed)
PT having intermittent agitation with staff. PT has been told that he needs to stay in the hospital for 7 days so that he can receive IV antibiotics for his current infection. PT began hitting the bed and saying, " yall are never gonna fuckin let me go." Pt did not attempt to hit nurse and behavior was somewhat re-directable when allowed to call girlfriend. Will continue to assess.

## 2023-04-08 NOTE — Progress Notes (Signed)
PROGRESS NOTE    Bradley Brandt  GEX:[id number] DOB: 11/04/1980 DOA: 04/03/2023 PCP: Donell Beers, FNP   Brief Narrative:  42 y.o. male with medical history significant for multiple sclerosis and bipolar disorder presented with abdominal pain, nausea and vomiting worsening for the last 3 weeks.  On presentation, WBC was 28.9, creatinine of 1.76.  CT of abdomen and pelvis with contrast showed nonspecific bilateral renal findings which could reflect pyelonephritis, mild urinary bladder thickening and 1.5 cm hypoattenuating left upper lobe renal lesion.  He was started on IV fluids and antibiotics.  He was found to have ESBL E. coli bacteremia.  Currently on IV meropenem  Assessment & Plan:   Possible acute pyelonephritis ESBL E. coli bacteremia -Imaging as above.  Continue meropenem; will possibly need at least 7 days of IV antibiotics.  Blood cultures growing ESBL E. coli.  Follow sensitivities.  Urine culture growing Enterococcus faecalis: Possibly colonizer. -No temperature spikes over the last 48 hours.  Leukocytosis -Resolved  AKI -Possibly from pyelonephritis.  Creatinine 1.76 on presentation.  Baseline creatinine of less than 1.  Creatinine improving to 0.85 on 04/07/2023.  Off IV fluids.  Encourage oral intake.   Hypomagnesemia -Improved  Hypoalbuminemia Moderate malnutrition -Follow nutrition recommendations  Normocytic anemia -Questionable cause.  Hemoglobin stable.  Monitor intermittently  Elevated LFTs -Resolved.  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  Patient was supposed to be getting Tysabri as an outpatient today at the infusion center: This has to be postponed because of current active bacteremia and pyelonephritis. -Outpatient follow-up with urology regarding suprapubic catheter care -PT following  Anxiety -Continue Cymbalta  DVT prophylaxis: Lovenox Code Status:  Full Family Communication: None at bedside Disposition Plan: Status is: Inpatient Remains inpatient appropriate because: Of severity of illness.  Need for IV antibiotics   Consultants: None  Procedures: None  Antimicrobials: Anti-infectives (From admission, onward)    Start     Dose/Rate Route Frequency Ordered Stop   04/04/23 1245  meropenem (MERREM) 1 g in sodium chloride 0.9 % 100 mL IVPB        1 g 200 mL/hr over 30 Minutes Intravenous Every 8 hours 04/04/23 1149     04/03/23 2200  ceFEPIme (MAXIPIME) 2 g in sodium chloride 0.9 % 100 mL IVPB  Status:  Discontinued        2 g 200 mL/hr over 30 Minutes Intravenous Every 8 hours 04/03/23 2042 04/04/23 1149        Subjective: Patient seen and examined at bedside.  No agitation, fever, chest pain or vomiting reported.  Poor historian.   Objective: Vitals:   04/07/23 0425 04/07/23 1348 04/07/23 2219 04/08/23 0637  BP: 104/62 110/75 119/67 122/65  Pulse: 63 82 88 85  Resp: 18 18 16 18   Temp: (!) 97.4 F (36.3 C) (!) 97.5 F (36.4 C) 98.3 F (36.8 C) 98.4 F (36.9 C)  TempSrc: Oral Oral Oral Oral  SpO2: 98% 100% 98% 97%  Weight:      Height:        Intake/Output Summary (Last 24 hours) at 04/08/2023 0749 Last data filed at 04/08/2023 0700 Gross per 24 hour  Intake 1400 ml  Output 4900 ml  Net -3500 ml   Filed Weights   04/03/23 2339  Weight: 69.3 kg    Examination:  General: On room air.  No distress.  Extremely slow to respond, flat affect.  Speech is not that comprehensible. respiratory:  Bilateral decreased breath sounds at bases with scattered crackles CVS: Currently rate controlled; S1-S2 heard  abdominal: Soft, nontender, slightly distended, no organomegaly; bowel sounds normally heard  extremities: No clubbing; mild lower extremity edema present  Data Reviewed: I have personally reviewed following labs and imaging studies  CBC: Recent Labs  Lab 04/03/23 1550 04/04/23 0337 04/05/23 0322  04/06/23 0319 04/07/23 0333  WBC 28.9* 23.1* 13.4* 7.0 4.7  NEUTROABS 24.1*  --   --   --  2.0  HGB 10.8* 9.9* 9.1* 8.6* 8.6*  HCT 32.5* 30.9* 28.6* 25.8* 26.7*  MCV 93.9 96.9 97.3 94.5 95.0  PLT 378 297 245 234 221   Basic Metabolic Panel: Recent Labs  Lab 04/03/23 1550 04/04/23 0337 04/05/23 0322 04/06/23 0319 04/07/23 0333  NA 137 144 138 138 138  K 4.8 4.6 3.7 3.6 4.5  CL 101 108 107 104 105  CO2 23 25 21* 26 26  GLUCOSE 125* 126* 119* 94 103*  BUN 27* 25* 20 17 21*  CREATININE 1.76* 1.82* 1.27* 1.08 0.85  CALCIUM 8.3* 8.3* 8.1* 8.4* 8.4*  MG  --   --  2.1 1.6* 1.8   GFR: Estimated Creatinine Clearance: 108.7 mL/min (by C-G formula based on SCr of 0.85 mg/dL). Liver Function Tests: Recent Labs  Lab 04/03/23 1550 04/04/23 0337 04/05/23 0322 04/06/23 0319  AST 54* 62* 39 31  ALT 49* 52* 43 36  ALKPHOS 93 85 76 63  BILITOT 1.0 0.9 0.4 0.5  PROT 7.8 7.0 6.2* 5.9*  ALBUMIN 2.5* 2.3* 2.1* 1.8*   No results for input(s): "LIPASE", "AMYLASE" in the last 168 hours. No results for input(s): "AMMONIA" in the last 168 hours. Coagulation Profile: No results for input(s): "INR", "PROTIME" in the last 168 hours. Cardiac Enzymes: No results for input(s): "CKTOTAL", "CKMB", "CKMBINDEX", "TROPONINI" in the last 168 hours. BNP (last 3 results) No results for input(s): "PROBNP" in the last 8760 hours. HbA1C: No results for input(s): "HGBA1C" in the last 72 hours. CBG: No results for input(s): "GLUCAP" in the last 168 hours. Lipid Profile: No results for input(s): "CHOL", "HDL", "LDLCALC", "TRIG", "CHOLHDL", "LDLDIRECT" in the last 72 hours. Thyroid Function Tests: No results for input(s): "TSH", "T4TOTAL", "FREET4", "T3FREE", "THYROIDAB" in the last 72 hours. Anemia Panel: No results for input(s): "VITAMINB12", "FOLATE", "FERRITIN", "TIBC", "IRON", "RETICCTPCT" in the last 72 hours. Sepsis Labs: Recent Labs  Lab 04/03/23 1802  LATICACIDVEN 1.1    Recent Results  (from the past 240 hour(s))  Blood culture (routine x 2)     Status: Abnormal   Collection Time: 04/03/23  5:50 PM   Specimen: Site Not Specified; Blood  Result Value Ref Range Status   Specimen Description   Final    SITE NOT SPECIFIED BLOOD Performed at [facility], [address], [place], [place] [address]    Special Requests   Final    BOTTLES DRAWN AEROBIC AND ANAEROBIC Blood Culture adequate volume Performed at [facility], [address] [place], [place] [address]    Culture  Setup Time   Final    GRAM NEGATIVE RODS IN BOTH AEROBIC AND ANAEROBIC BOTTLES CRITICAL RESULT CALLED TO, READ BACK BY AND VERIFIED WITH: PHARMD T.GREEN AT 1138 ON 04/04/2023 BY T.SAAD. Performed at [facility], [address], [place], [place] [address]    Culture (A)  Final    ESCHERICHIA COLI Confirmed Extended Spectrum Beta-Lactamase Producer (ESBL).  In bloodstream infections from ESBL organisms, carbapenems are  preferred over piperacillin/tazobactam. They are shown to have a lower risk of mortality.    Report Status 04/06/2023 FINAL  Final   Organism ID, Bacteria ESCHERICHIA COLI  Final      Susceptibility   Escherichia coli - MIC*    AMPICILLIN >=32 RESISTANT Resistant     CEFEPIME >=32 RESISTANT Resistant     CEFTAZIDIME RESISTANT Resistant     CEFTRIAXONE >=64 RESISTANT Resistant     CIPROFLOXACIN >=4 RESISTANT Resistant     GENTAMICIN <=1 SENSITIVE Sensitive     IMIPENEM <=0.25 SENSITIVE Sensitive     TRIMETH/SULFA <=20 SENSITIVE Sensitive     AMPICILLIN/SULBACTAM 16 INTERMEDIATE Intermediate     PIP/TAZO <=4 SENSITIVE Sensitive ug/mL    * ESCHERICHIA COLI  Blood culture (routine x 2)     Status: Abnormal   Collection Time: 04/03/23  5:50 PM   Specimen: Site Not Specified; Blood  Result Value Ref Range Status   Specimen Description   Final    SITE NOT SPECIFIED BLOOD Performed at [facility], [address], [place], [place] [address]     Special Requests   Final    BOTTLES DRAWN AEROBIC AND ANAEROBIC Blood Culture adequate volume Performed at [facility], [address] [place], [place] [address]    Culture  Setup Time   Final    GRAM NEGATIVE RODS IN BOTH AEROBIC AND ANAEROBIC BOTTLES CRITICAL VALUE NOTED.  VALUE IS CONSISTENT WITH PREVIOUSLY REPORTED AND CALLED VALUE. Performed at [facility], [address], [place], [place] [address]    Culture ESCHERICHIA COLI (A)  Final   Report Status 04/06/2023 FINAL  Final  Blood Culture ID Panel (Reflexed)     Status: Abnormal   Collection Time: 04/03/23  5:50 PM  Result Value Ref Range Status   Enterococcus faecalis NOT DETECTED NOT DETECTED Final   Enterococcus Faecium NOT DETECTED NOT DETECTED Final   Listeria monocytogenes NOT DETECTED NOT DETECTED Final   Staphylococcus species NOT DETECTED NOT DETECTED Final   Staphylococcus aureus (BCID) NOT DETECTED NOT DETECTED Final   Staphylococcus epidermidis NOT DETECTED NOT DETECTED Final   Staphylococcus lugdunensis NOT DETECTED NOT DETECTED Final   Streptococcus species NOT DETECTED NOT DETECTED Final   Streptococcus agalactiae NOT DETECTED NOT DETECTED Final   Streptococcus pneumoniae NOT DETECTED NOT DETECTED Final   Streptococcus pyogenes NOT DETECTED NOT DETECTED Final   A.calcoaceticus-baumannii NOT DETECTED NOT DETECTED Final   Bacteroides fragilis NOT DETECTED NOT DETECTED Final   Enterobacterales DETECTED (A) NOT DETECTED Final    Comment: Enterobacterales represent a large order of gram negative bacteria, not a single organism. CRITICAL RESULT CALLED TO, READ BACK BY AND VERIFIED WITH: PHARMD T.GREEN AT 1138 ON 04/04/2023 BY T.SAAD.    Enterobacter cloacae complex NOT DETECTED NOT DETECTED Final   Escherichia coli DETECTED (A) NOT DETECTED Final    Comment: CRITICAL RESULT CALLED TO, READ BACK BY AND VERIFIED WITH: PHARMD T.GREEN AT 1138 ON 04/04/2023 BY T.SAAD.    Klebsiella  aerogenes NOT DETECTED NOT DETECTED Final   Klebsiella oxytoca NOT DETECTED NOT DETECTED Final   Klebsiella pneumoniae NOT DETECTED NOT DETECTED Final   Proteus species NOT DETECTED NOT DETECTED Final   Salmonella species NOT DETECTED NOT DETECTED Final   Serratia marcescens NOT DETECTED NOT DETECTED Final   Haemophilus influenzae NOT DETECTED NOT DETECTED Final   Neisseria meningitidis NOT DETECTED NOT DETECTED Final   Pseudomonas aeruginosa NOT DETECTED NOT DETECTED Final  Stenotrophomonas maltophilia NOT DETECTED NOT DETECTED Final   Candida albicans NOT DETECTED NOT DETECTED Final   Candida auris NOT DETECTED NOT DETECTED Final   Candida glabrata NOT DETECTED NOT DETECTED Final   Candida krusei NOT DETECTED NOT DETECTED Final   Candida parapsilosis NOT DETECTED NOT DETECTED Final   Candida tropicalis NOT DETECTED NOT DETECTED Final   Cryptococcus neoformans/gattii NOT DETECTED NOT DETECTED Final   CTX-M ESBL DETECTED (A) NOT DETECTED Final    Comment: CRITICAL RESULT CALLED TO, READ BACK BY AND VERIFIED WITH: PHARMD T.GREEN AT 1138 ON 04/04/2023 BY T.SAAD. (NOTE) Extended spectrum beta-lactamase detected. Recommend a carbapenem as initial therapy.      Carbapenem resistance IMP NOT DETECTED NOT DETECTED Final   Carbapenem resistance KPC NOT DETECTED NOT DETECTED Final   Carbapenem resistance NDM NOT DETECTED NOT DETECTED Final   Carbapenem resist OXA 48 LIKE NOT DETECTED NOT DETECTED Final   Carbapenem resistance VIM NOT DETECTED NOT DETECTED Final    Comment: Performed at [facility], [address], [place], [place] [address]  Urine Culture (for pregnant, neutropenic or urologic patients or patients with an indwelling urinary catheter)     Status: Abnormal (Preliminary result)   Collection Time: 04/04/23 11:30 AM   Specimen: Urine, Clean Catch  Result Value Ref Range Status   Specimen Description   Final    URINE, CLEAN CATCH Performed at [facility], [address] [place], [place] [address]    Special Requests   Final    NONE Performed at [facility], [address] [place], [place] [address]    Culture (A)  Final    70,000 COLONIES/mL ENTEROCOCCUS FAECALIS 10,000 COLONIES/mL STENOTROPHOMONAS MALTOPHILIA SUSCEPTIBILITIES TO FOLLOW VANCOMYCIN RESISTANT ENTEROCOCCUS ISOLATED    Report Status PENDING  Incomplete   Organism ID, Bacteria ENTEROCOCCUS FAECALIS (A)  Final      Susceptibility   Enterococcus faecalis - MIC*    AMPICILLIN <=2 SENSITIVE Sensitive     NITROFURANTOIN <=16 SENSITIVE Sensitive     VANCOMYCIN >=32 RESISTANT Resistant     LINEZOLID 2 SENSITIVE Sensitive     * 70,000 COLONIES/mL ENTEROCOCCUS FAECALIS         Radiology Studies: No results found.      Scheduled Meds:  DULoxetine  30 mg Oral Daily   enoxaparin (LOVENOX) injection  40 mg Subcutaneous Q24H   feeding supplement  237 mL Oral TID BM   gabapentin  400 mg Oral BID   multivitamin with minerals  1 tablet Oral Daily   oxybutynin  10 mg Oral Daily   sodium chloride flush  3 mL Intravenous Q12H   Continuous Infusions:  meropenem (MERREM) IV 1 g (04/08/23 0527)          Glade Lloyd, MD [facility] 04/08/2023, 7:49 AM

## 2023-04-08 NOTE — Plan of Care (Signed)
  Problem: Clinical Measurements: Goal: Diagnostic test results will improve Outcome: Progressing Goal: Signs and symptoms of infection will decrease Outcome: Progressing   Problem: Education: Goal: Knowledge of General Education information will improve Description: Including pain rating scale, medication(s)/side effects and non-pharmacologic comfort measures Outcome: Adequate for Discharge   Problem: Health Behavior/Discharge Planning: Goal: Ability to manage health-related needs will improve Outcome: Progressing   Problem: Clinical Measurements: Goal: Ability to maintain clinical measurements within normal limits will improve Outcome: Progressing Goal: Will remain free from infection Outcome: Progressing Goal: Diagnostic test results will improve Outcome: Progressing Goal: Respiratory complications will improve Outcome: Progressing Goal: Cardiovascular complication will be avoided Outcome: Progressing   Problem: Coping: Goal: Level of anxiety will decrease Outcome: Progressing   Problem: Elimination: Goal: Will not experience complications related to bowel motility Outcome: Progressing   Problem: Pain Managment: Goal: General experience of comfort will improve Outcome: Adequate for Discharge   Problem: Safety: Goal: Ability to remain free from injury will improve Outcome: Progressing   Problem: Skin Integrity: Goal: Risk for impaired skin integrity will decrease Outcome: Progressing

## 2023-04-09 DIAGNOSIS — N10 Acute tubulo-interstitial nephritis: Secondary | ICD-10-CM | POA: Diagnosis not present

## 2023-04-09 LAB — URINE CULTURE: Culture: 70000 — AB

## 2023-04-09 NOTE — Progress Notes (Signed)
PROGRESS NOTE    Bradley Brandt  QMV:[id number] DOB: 1980-12-22 DOA: 04/03/2023 PCP: Donell Beers, FNP   Brief Narrative:  42 y.o. male with medical history significant for multiple sclerosis and bipolar disorder presented with abdominal pain, nausea and vomiting worsening for the last 3 weeks.  On presentation, WBC was 28.9, creatinine of 1.76.  CT of abdomen and pelvis with contrast showed nonspecific bilateral renal findings which could reflect pyelonephritis, mild urinary bladder thickening and 1.5 cm hypoattenuating left upper lobe renal lesion.  He was started on IV fluids and antibiotics.  He was found to have ESBL E. coli bacteremia.  Currently on IV meropenem  Assessment & Plan:   Possible acute pyelonephritis ESBL E. coli bacteremia -Imaging as above.  Continue meropenem; will possibly need at least 7 days of IV antibiotics.  Blood cultures growing ESBL E. coli.  Urine culture growing Enterococcus faecalis: Possibly colonizer. -No temperature spikes over the last 48 hours.  Leukocytosis -Resolved  AKI -Possibly from pyelonephritis.  Creatinine 1.76 on presentation.  Baseline creatinine of less than 1.  Creatinine improving to 0.85 on 04/07/2023.  Off IV fluids.  Encourage oral intake.   Hypomagnesemia -Improved  Hypoalbuminemia Moderate malnutrition -Follow nutrition recommendations  Normocytic anemia -Questionable cause.  Hemoglobin stable.  Monitor intermittently  Elevated LFTs -Resolved.  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  Patient was supposed to be getting Tysabri as an outpatient today at the infusion center: This has to be postponed because of current active bacteremia and pyelonephritis. -Outpatient follow-up with urology regarding suprapubic catheter care -PT following  Anxiety -Continue Cymbalta  DVT prophylaxis: Lovenox Code Status: Full Family  Communication: None at bedside Disposition Plan: Status is: Inpatient Remains inpatient appropriate because: Of severity of illness.  Need for IV antibiotics   Consultants: None  Procedures: None  Antimicrobials: Anti-infectives (From admission, onward)    Start     Dose/Rate Route Frequency Ordered Stop   04/04/23 1245  meropenem (MERREM) 1 g in sodium chloride 0.9 % 100 mL IVPB        1 g 200 mL/hr over 30 Minutes Intravenous Every 8 hours 04/04/23 1149     04/03/23 2200  ceFEPIme (MAXIPIME) 2 g in sodium chloride 0.9 % 100 mL IVPB  Status:  Discontinued        2 g 200 mL/hr over 30 Minutes Intravenous Every 8 hours 04/03/23 2042 04/04/23 1149        Subjective: Patient seen and examined at bedside.  No agitation, fever, chest pain or vomiting reported.  Poor historian.   Objective: Vitals:   04/08/23 0637 04/08/23 1344 04/08/23 2236 04/09/23 0630  BP: 122/65 (!) 116/53 102/64 106/63  Pulse: 85 65 94 75  Resp: 18 17 17 16   Temp: 98.4 F (36.9 C) (!) 97.5 F (36.4 C) 98.4 F (36.9 C) 97.7 F (36.5 C)  TempSrc: Oral Oral Oral   SpO2: 97% 100% 97% 97%  Weight:      Height:        Intake/Output Summary (Last 24 hours) at 04/09/2023 0735 Last data filed at 04/09/2023 0630 Gross per 24 hour  Intake 1399.92 ml  Output 3700 ml  Net -2300.08 ml   Filed Weights   04/03/23 2339  Weight: 69.3 kg    Examination:  General: No acute distress.  Remains on room air.  Extremely slow to respond, flat affect.  Speech is not that comprehensible. respiratory: Decreased  breath sounds at bases bilaterally with some crackles  CVS: S1-S2 are heard; rate currently controlled abdominal: Soft, nontender, mildly distended, no organomegaly; normal bowel sounds heard.  Suprapubic catheter present. extremities: Trace lower extremity edema present; no cyanosis Data Reviewed: I have personally reviewed following labs and imaging studies  CBC: Recent Labs  Lab 04/03/23 1550  04/04/23 0337 04/05/23 0322 04/06/23 0319 04/07/23 0333  WBC 28.9* 23.1* 13.4* 7.0 4.7  NEUTROABS 24.1*  --   --   --  2.0  HGB 10.8* 9.9* 9.1* 8.6* 8.6*  HCT 32.5* 30.9* 28.6* 25.8* 26.7*  MCV 93.9 96.9 97.3 94.5 95.0  PLT 378 297 245 234 221   Basic Metabolic Panel: Recent Labs  Lab 04/03/23 1550 04/04/23 0337 04/05/23 0322 04/06/23 0319 04/07/23 0333  NA 137 144 138 138 138  K 4.8 4.6 3.7 3.6 4.5  CL 101 108 107 104 105  CO2 23 25 21* 26 26  GLUCOSE 125* 126* 119* 94 103*  BUN 27* 25* 20 17 21*  CREATININE 1.76* 1.82* 1.27* 1.08 0.85  CALCIUM 8.3* 8.3* 8.1* 8.4* 8.4*  MG  --   --  2.1 1.6* 1.8   GFR: Estimated Creatinine Clearance: 108.7 mL/min (by C-G formula based on SCr of 0.85 mg/dL). Liver Function Tests: Recent Labs  Lab 04/03/23 1550 04/04/23 0337 04/05/23 0322 04/06/23 0319  AST 54* 62* 39 31  ALT 49* 52* 43 36  ALKPHOS 93 85 76 63  BILITOT 1.0 0.9 0.4 0.5  PROT 7.8 7.0 6.2* 5.9*  ALBUMIN 2.5* 2.3* 2.1* 1.8*   No results for input(s): "LIPASE", "AMYLASE" in the last 168 hours. No results for input(s): "AMMONIA" in the last 168 hours. Coagulation Profile: No results for input(s): "INR", "PROTIME" in the last 168 hours. Cardiac Enzymes: No results for input(s): "CKTOTAL", "CKMB", "CKMBINDEX", "TROPONINI" in the last 168 hours. BNP (last 3 results) No results for input(s): "PROBNP" in the last 8760 hours. HbA1C: No results for input(s): "HGBA1C" in the last 72 hours. CBG: No results for input(s): "GLUCAP" in the last 168 hours. Lipid Profile: No results for input(s): "CHOL", "HDL", "LDLCALC", "TRIG", "CHOLHDL", "LDLDIRECT" in the last 72 hours. Thyroid Function Tests: No results for input(s): "TSH", "T4TOTAL", "FREET4", "T3FREE", "THYROIDAB" in the last 72 hours. Anemia Panel: No results for input(s): "VITAMINB12", "FOLATE", "FERRITIN", "TIBC", "IRON", "RETICCTPCT" in the last 72 hours. Sepsis Labs: Recent Labs  Lab 04/03/23 1802   LATICACIDVEN 1.1    Recent Results (from the past 240 hour(s))  Blood culture (routine x 2)     Status: Abnormal   Collection Time: 04/03/23  5:50 PM   Specimen: Site Not Specified; Blood  Result Value Ref Range Status   Specimen Description   Final    SITE NOT SPECIFIED BLOOD Performed at [facility], [address], [place], [place] [address]    Special Requests   Final    BOTTLES DRAWN AEROBIC AND ANAEROBIC Blood Culture adequate volume Performed at [facility], [address], [place], [place] [address]    Culture  Setup Time   Final    GRAM NEGATIVE RODS IN BOTH AEROBIC AND ANAEROBIC BOTTLES CRITICAL RESULT CALLED TO, READ BACK BY AND VERIFIED WITH: PHARMD T.GREEN AT 1138 ON 04/04/2023 BY T.SAAD. Performed at [facility], [address] [place], [place] [address]    Culture (A)  Final    ESCHERICHIA COLI Confirmed Extended Spectrum Beta-Lactamase Producer (ESBL).  In bloodstream infections from ESBL organisms,  carbapenems are preferred over piperacillin/tazobactam. They are shown to have a lower risk of mortality.    Report Status 04/06/2023 FINAL  Final   Organism ID, Bacteria ESCHERICHIA COLI  Final      Susceptibility   Escherichia coli - MIC*    AMPICILLIN >=32 RESISTANT Resistant     CEFEPIME >=32 RESISTANT Resistant     CEFTAZIDIME RESISTANT Resistant     CEFTRIAXONE >=64 RESISTANT Resistant     CIPROFLOXACIN >=4 RESISTANT Resistant     GENTAMICIN <=1 SENSITIVE Sensitive     IMIPENEM <=0.25 SENSITIVE Sensitive     TRIMETH/SULFA <=20 SENSITIVE Sensitive     AMPICILLIN/SULBACTAM 16 INTERMEDIATE Intermediate     PIP/TAZO <=4 SENSITIVE Sensitive ug/mL    * ESCHERICHIA COLI  Blood culture (routine x 2)     Status: Abnormal   Collection Time: 04/03/23  5:50 PM   Specimen: Site Not Specified; Blood  Result Value Ref Range Status   Specimen Description   Final    SITE NOT SPECIFIED BLOOD Performed at [facility], [address], [place], [place] [address]    Special Requests   Final    BOTTLES DRAWN AEROBIC AND ANAEROBIC Blood Culture adequate volume Performed at [facility], [address], [place], [place] [address]    Culture  Setup Time   Final    GRAM NEGATIVE RODS IN BOTH AEROBIC AND ANAEROBIC BOTTLES CRITICAL VALUE NOTED.  VALUE IS CONSISTENT WITH PREVIOUSLY REPORTED AND CALLED VALUE. Performed at [facility], [address] [place], [place] [address]    Culture ESCHERICHIA COLI (A)  Final   Report Status 04/06/2023 FINAL  Final  Blood Culture ID Panel (Reflexed)     Status: Abnormal   Collection Time: 04/03/23  5:50 PM  Result Value Ref Range Status   Enterococcus faecalis NOT DETECTED NOT DETECTED Final   Enterococcus Faecium NOT DETECTED NOT DETECTED Final   Listeria monocytogenes NOT DETECTED NOT DETECTED Final   Staphylococcus species NOT DETECTED NOT DETECTED Final   Staphylococcus aureus (BCID) NOT DETECTED NOT DETECTED Final   Staphylococcus epidermidis NOT DETECTED NOT DETECTED Final   Staphylococcus lugdunensis NOT DETECTED NOT DETECTED Final   Streptococcus species NOT DETECTED NOT DETECTED Final   Streptococcus agalactiae NOT DETECTED NOT DETECTED Final   Streptococcus pneumoniae NOT DETECTED NOT DETECTED Final   Streptococcus pyogenes NOT DETECTED NOT DETECTED Final   A.calcoaceticus-baumannii NOT DETECTED NOT DETECTED Final   Bacteroides fragilis NOT DETECTED NOT DETECTED Final   Enterobacterales DETECTED (A) NOT DETECTED Final    Comment: Enterobacterales represent a large order of gram negative bacteria, not a single organism. CRITICAL RESULT CALLED TO, READ BACK BY AND VERIFIED WITH: PHARMD T.GREEN AT 1138 ON 04/04/2023 BY T.SAAD.    Enterobacter cloacae complex NOT DETECTED NOT DETECTED Final   Escherichia coli DETECTED (A) NOT DETECTED Final    Comment: CRITICAL RESULT CALLED TO, READ BACK BY AND VERIFIED WITH: PHARMD T.GREEN AT 1138 ON  04/04/2023 BY T.SAAD.    Klebsiella aerogenes NOT DETECTED NOT DETECTED Final   Klebsiella oxytoca NOT DETECTED NOT DETECTED Final   Klebsiella pneumoniae NOT DETECTED NOT DETECTED Final   Proteus species NOT DETECTED NOT DETECTED Final   Salmonella species NOT DETECTED NOT DETECTED Final   Serratia marcescens NOT DETECTED NOT DETECTED Final   Haemophilus influenzae NOT DETECTED NOT DETECTED Final   Neisseria meningitidis NOT DETECTED NOT DETECTED Final   Pseudomonas aeruginosa NOT DETECTED NOT DETECTED Final  Stenotrophomonas maltophilia NOT DETECTED NOT DETECTED Final   Candida albicans NOT DETECTED NOT DETECTED Final   Candida auris NOT DETECTED NOT DETECTED Final   Candida glabrata NOT DETECTED NOT DETECTED Final   Candida krusei NOT DETECTED NOT DETECTED Final   Candida parapsilosis NOT DETECTED NOT DETECTED Final   Candida tropicalis NOT DETECTED NOT DETECTED Final   Cryptococcus neoformans/gattii NOT DETECTED NOT DETECTED Final   CTX-M ESBL DETECTED (A) NOT DETECTED Final    Comment: CRITICAL RESULT CALLED TO, READ BACK BY AND VERIFIED WITH: PHARMD T.GREEN AT 1138 ON 04/04/2023 BY T.SAAD. (NOTE) Extended spectrum beta-lactamase detected. Recommend a carbapenem as initial therapy.      Carbapenem resistance IMP NOT DETECTED NOT DETECTED Final   Carbapenem resistance KPC NOT DETECTED NOT DETECTED Final   Carbapenem resistance NDM NOT DETECTED NOT DETECTED Final   Carbapenem resist OXA 48 LIKE NOT DETECTED NOT DETECTED Final   Carbapenem resistance VIM NOT DETECTED NOT DETECTED Final    Comment: Performed at [facility], [address], [place], [place] [address]  Urine Culture (for pregnant, neutropenic or urologic patients or patients with an indwelling urinary catheter)     Status: Abnormal (Preliminary result)   Collection Time: 04/04/23 11:30 AM   Specimen: Urine, Clean Catch  Result Value Ref Range Status   Specimen Description   Final    URINE, CLEAN  CATCH Performed at [facility], [address] [place], [place] [address]    Special Requests   Final    NONE Performed at [facility], [address], [place], [place] [address]    Culture (A)  Final    70,000 COLONIES/mL ENTEROCOCCUS FAECALIS 10,000 COLONIES/mL STENOTROPHOMONAS MALTOPHILIA SUSCEPTIBILITIES TO FOLLOW VANCOMYCIN RESISTANT ENTEROCOCCUS ISOLATED    Report Status PENDING  Incomplete   Organism ID, Bacteria ENTEROCOCCUS FAECALIS (A)  Final      Susceptibility   Enterococcus faecalis - MIC*    AMPICILLIN <=2 SENSITIVE Sensitive     NITROFURANTOIN <=16 SENSITIVE Sensitive     VANCOMYCIN >=32 RESISTANT Resistant     LINEZOLID 2 SENSITIVE Sensitive     * 70,000 COLONIES/mL ENTEROCOCCUS FAECALIS         Radiology Studies: No results found.      Scheduled Meds:  DULoxetine  30 mg Oral Daily   enoxaparin (LOVENOX) injection  40 mg Subcutaneous Q24H   feeding supplement  237 mL Oral TID BM   gabapentin  400 mg Oral BID   multivitamin with minerals  1 tablet Oral Daily   oxybutynin  10 mg Oral Daily   sodium chloride flush  3 mL Intravenous Q12H   Continuous Infusions:  meropenem (MERREM) IV 1 g (04/09/23 0636)          Glade Lloyd, MD [facility] 04/09/2023, 7:35 AM

## 2023-04-09 NOTE — Progress Notes (Signed)
Patient became irritable this morning, asking when he is going home. Multiple staff have explained today and previously that recommendation is for patient to stay until 7-day regimen of IV antbx completed. Patient verbalizes understanding.  Patient also irritable when suprapubic site cleaned. RN removes crusted secretions gently and patient states that staff is trying to hurt him. Education provided that site care is very important for the prevention of future infections. Patient verbalizes understanding.  Haydee Salter, RN 04/09/23 11:54 AM

## 2023-04-09 NOTE — Plan of Care (Signed)
Problem: Clinical Measurements: Goal: Diagnostic test results will improve Outcome: Progressing   Problem: Elimination: Goal: Will not experience complications related to bowel motility Outcome: Progressing   Problem: Pain Managment: Goal: General experience of comfort will improve Outcome: Progressing   Haydee Salter, RN 04/09/23 8:15 PM

## 2023-04-10 DIAGNOSIS — N10 Acute tubulo-interstitial nephritis: Secondary | ICD-10-CM | POA: Diagnosis not present

## 2023-04-10 LAB — BASIC METABOLIC PANEL
Anion gap: 9 (ref 5–15)
BUN: 27 mg/dL — ABNORMAL HIGH (ref 6–20)
CO2: 28 mmol/L (ref 22–32)
Calcium: 8.8 mg/dL — ABNORMAL LOW (ref 8.9–10.3)
Chloride: 99 mmol/L (ref 98–111)
Creatinine, Ser: 0.84 mg/dL (ref 0.61–1.24)
GFR, Estimated: >60 mL/min (ref >60)
Glucose, Bld: 102 mg/dL — ABNORMAL HIGH (ref 70–99)
Potassium: 4.3 mmol/L (ref 3.5–5.1)
Sodium: 136 mmol/L (ref 135–145)

## 2023-04-10 LAB — CBC WITH DIFFERENTIAL/PLATELET
Abs Immature Granulocytes: 0.06 10*3/uL (ref 0.00–0.07)
Basophils Absolute: 0 10*3/uL (ref 0.0–0.1)
Basophils Relative: 1 %
Eosinophils Absolute: 0.1 10*3/uL (ref 0.0–0.5)
Eosinophils Relative: 2 %
HCT: 27.3 % — ABNORMAL LOW (ref 39.0–52.0)
Hemoglobin: 8.9 g/dL — ABNORMAL LOW (ref 13.0–17.0)
Immature Granulocytes: 1 %
Lymphocytes Relative: 42 %
Lymphs Abs: 2.7 10*3/uL (ref 0.7–4.0)
MCH: 30.8 pg (ref 26.0–34.0)
MCHC: 32.6 g/dL (ref 30.0–36.0)
MCV: 94.5 fL (ref 80.0–100.0)
Monocytes Absolute: 0.6 10*3/uL (ref 0.1–1.0)
Monocytes Relative: 9 %
Neutro Abs: 3 10*3/uL (ref 1.7–7.7)
Neutrophils Relative %: 45 %
Platelets: 259 10*3/uL (ref 150–400)
RBC: 2.89 MIL/uL — ABNORMAL LOW (ref 4.22–5.81)
RDW: 13.5 % (ref 11.5–15.5)
WBC: 6.4 10*3/uL (ref 4.0–10.5)
nRBC: 0 % (ref 0.0–0.2)

## 2023-04-10 LAB — MAGNESIUM: Magnesium: 2 mg/dL (ref 1.7–2.4)

## 2023-04-10 NOTE — Plan of Care (Signed)

## 2023-04-10 NOTE — Progress Notes (Signed)
PROGRESS NOTE    Bradley Brandt  QMV:[id number] DOB: [birth year] DOA: 04/03/2023 PCP: Donell Beers, FNP   Brief Narrative:  42 y.o. male with medical history significant for multiple sclerosis and bipolar disorder presented with abdominal pain, nausea and vomiting worsening for the last 3 weeks.  On presentation, WBC was 28.9, creatinine of 1.76.  CT of abdomen and pelvis with contrast showed nonspecific bilateral renal findings which could reflect pyelonephritis, mild urinary bladder thickening and 1.5 cm hypoattenuating left upper lobe renal lesion.  He was started on IV fluids and antibiotics.  He was found to have ESBL E. coli bacteremia.  Currently on IV meropenem.  Assessment & Plan:   Possible acute pyelonephritis ESBL E. coli bacteremia -Imaging as above.  Continue meropenem; will possibly need at least 7 days of IV antibiotics.  Blood cultures growing ESBL E. coli.  Urine culture growing Enterococcus faecalis: Possibly colonizer. -No temperature spikes over the last few days.  Leukocytosis -Resolved  AKI -Possibly from pyelonephritis.  Creatinine 1.76 on presentation.  Baseline creatinine of less than 1.  Resolved.  Off IV fluids.  Encourage oral intake.   Hypomagnesemia -Improved  Hypoalbuminemia Moderate malnutrition -Follow nutrition recommendations  Normocytic anemia -Questionable cause.  Hemoglobin stable.  Monitor intermittently  Elevated LFTs -Resolved.  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  Patient was supposed to be getting Tysabri as an outpatient today at the infusion center: This has to be postponed because of current active bacteremia and pyelonephritis. -Outpatient follow-up with urology regarding suprapubic catheter care  Anxiety -Continue Cymbalta  DVT prophylaxis: Lovenox Code Status: Full Family Communication: None at bedside Disposition Plan: Status  is: Inpatient Remains inpatient appropriate because: Of severity of illness.  Need for IV antibiotics.  Possible discharge home tomorrow if remains afebrile after completion of IV antibiotics.   Consultants: None  Procedures: None  Antimicrobials: Anti-infectives (From admission, onward)    Start     Dose/Rate Route Frequency Ordered Stop   04/04/23 1245  meropenem (MERREM) 1 g in sodium chloride 0.9 % 100 mL IVPB        1 g 200 mL/hr over 30 Minutes Intravenous Every 8 hours 04/04/23 1149     04/03/23 2200  ceFEPIme (MAXIPIME) 2 g in sodium chloride 0.9 % 100 mL IVPB  Status:  Discontinued        2 g 200 mL/hr over 30 Minutes Intravenous Every 8 hours 04/03/23 2042 04/04/23 1149        Subjective: Patient seen and examined at bedside.  Poor historian.  No fever, agitation, seizures or vomiting reported.   Objective: Vitals:   04/09/23 0630 04/09/23 1347 04/09/23 2033 04/10/23 0607  BP: 106/63 119/72 107/70 (!) 123/102  Pulse: 75 84 71 80  Resp: 16 17 16 15   Temp: 97.7 F (36.5 C) 97.7 F (36.5 C) 98.3 F (36.8 C) 98.3 F (36.8 C)  TempSrc:  Oral  Oral  SpO2: 97% 100% 99% 99%  Weight:      Height:        Intake/Output Summary (Last 24 hours) at 04/10/2023 0744 Last data filed at 04/09/2023 2033 Gross per 24 hour  Intake 1820.11 ml  Output 2100 ml  Net -279.89 ml   Filed Weights   04/03/23 2339  Weight: 69.3 kg    Examination:  General: On room air.  No distress.  Still slow to respond, flat affect.  Speech is not that comprehensible.  respiratory: Bilateral breath sounds at bases with scattered crackles  CVS: Rate mostly controlled; S1 and S2 are heard  abdominal: Soft, nontender, distended slightly; no organomegaly; bowel is normally heard.  Suprapubic catheter is present. extremities: No clubbing; mild lower extremity edema present   data Reviewed: I have personally reviewed following labs and imaging studies  CBC: Recent Labs  Lab 04/03/23 1550  04/04/23 0337 04/05/23 0322 04/06/23 0319 04/07/23 0333 04/10/23 0348  WBC 28.9* 23.1* 13.4* 7.0 4.7 6.4  NEUTROABS 24.1*  --   --   --  2.0 3.0  HGB 10.8* 9.9* 9.1* 8.6* 8.6* 8.9*  HCT 32.5* 30.9* 28.6* 25.8* 26.7* 27.3*  MCV 93.9 96.9 97.3 94.5 95.0 94.5  PLT 378 297 245 234 221 259   Basic Metabolic Panel: Recent Labs  Lab 04/04/23 0337 04/05/23 0322 04/06/23 0319 04/07/23 0333 04/10/23 0348  NA 144 138 138 138 136  K 4.6 3.7 3.6 4.5 4.3  CL 108 107 104 105 99  CO2 25 21* 26 26 28   GLUCOSE 126* 119* 94 103* 102*  BUN 25* 20 17 21* 27*  CREATININE 1.82* 1.27* 1.08 0.85 0.84  CALCIUM 8.3* 8.1* 8.4* 8.4* 8.8*  MG  --  2.1 1.6* 1.8 2.0   GFR: Estimated Creatinine Clearance: 110 mL/min (by C-G formula based on SCr of 0.84 mg/dL). Liver Function Tests: Recent Labs  Lab 04/03/23 1550 04/04/23 0337 04/05/23 0322 04/06/23 0319  AST 54* 62* 39 31  ALT 49* 52* 43 36  ALKPHOS 93 85 76 63  BILITOT 1.0 0.9 0.4 0.5  PROT 7.8 7.0 6.2* 5.9*  ALBUMIN 2.5* 2.3* 2.1* 1.8*   No results for input(s): "LIPASE", "AMYLASE" in the last 168 hours. No results for input(s): "AMMONIA" in the last 168 hours. Coagulation Profile: No results for input(s): "INR", "PROTIME" in the last 168 hours. Cardiac Enzymes: No results for input(s): "CKTOTAL", "CKMB", "CKMBINDEX", "TROPONINI" in the last 168 hours. BNP (last 3 results) No results for input(s): "PROBNP" in the last 8760 hours. HbA1C: No results for input(s): "HGBA1C" in the last 72 hours. CBG: No results for input(s): "GLUCAP" in the last 168 hours. Lipid Profile: No results for input(s): "CHOL", "HDL", "LDLCALC", "TRIG", "CHOLHDL", "LDLDIRECT" in the last 72 hours. Thyroid Function Tests: No results for input(s): "TSH", "T4TOTAL", "FREET4", "T3FREE", "THYROIDAB" in the last 72 hours. Anemia Panel: No results for input(s): "VITAMINB12", "FOLATE", "FERRITIN", "TIBC", "IRON", "RETICCTPCT" in the last 72 hours. Sepsis Labs: Recent  Labs  Lab 04/03/23 1802  LATICACIDVEN 1.1    Recent Results (from the past 240 hour(s))  Blood culture (routine x 2)     Status: Abnormal   Collection Time: 04/03/23  5:50 PM   Specimen: Site Not Specified; Blood  Result Value Ref Range Status   Specimen Description   Final    SITE NOT SPECIFIED BLOOD Performed at [facility], [address] [place], [place] [address]    Special Requests   Final    BOTTLES DRAWN AEROBIC AND ANAEROBIC Blood Culture adequate volume Performed at [facility] [address] [place], [place] [address]    Culture  Setup Time   Final    GRAM NEGATIVE RODS IN BOTH AEROBIC AND ANAEROBIC BOTTLES CRITICAL RESULT CALLED TO, READ BACK BY AND VERIFIED WITH: PHARMD T.GREEN AT 1138 ON 04/04/2023 BY T.SAAD. Performed at [facility], [address], [place], [place] [address]    Culture (A)  Final    ESCHERICHIA COLI  Confirmed Extended Spectrum Beta-Lactamase Producer (ESBL).  In bloodstream infections from ESBL organisms, carbapenems are preferred over piperacillin/tazobactam. They are shown to have a lower risk of mortality.    Report Status 04/06/2023 FINAL  Final   Organism ID, Bacteria ESCHERICHIA COLI  Final      Susceptibility   Escherichia coli - MIC*    AMPICILLIN >=32 RESISTANT Resistant     CEFEPIME >=32 RESISTANT Resistant     CEFTAZIDIME RESISTANT Resistant     CEFTRIAXONE >=64 RESISTANT Resistant     CIPROFLOXACIN >=4 RESISTANT Resistant     GENTAMICIN <=1 SENSITIVE Sensitive     IMIPENEM <=0.25 SENSITIVE Sensitive     TRIMETH/SULFA <=20 SENSITIVE Sensitive     AMPICILLIN/SULBACTAM 16 INTERMEDIATE Intermediate     PIP/TAZO <=4 SENSITIVE Sensitive ug/mL    * ESCHERICHIA COLI  Blood culture (routine x 2)     Status: Abnormal   Collection Time: 04/03/23  5:50 PM   Specimen: Site Not Specified; Blood  Result Value Ref Range Status   Specimen Description   Final    SITE NOT SPECIFIED BLOOD Performed at  [facility], [address] [place], [place] [address]    Special Requests   Final    BOTTLES DRAWN AEROBIC AND ANAEROBIC Blood Culture adequate volume Performed at [facility], [address] [place], [place] [address]    Culture  Setup Time   Final    GRAM NEGATIVE RODS IN BOTH AEROBIC AND ANAEROBIC BOTTLES CRITICAL VALUE NOTED.  VALUE IS CONSISTENT WITH PREVIOUSLY REPORTED AND CALLED VALUE. Performed at [facility], [address] [place], [place] [address]    Culture ESCHERICHIA COLI (A)  Final   Report Status 04/06/2023 FINAL  Final  Blood Culture ID Panel (Reflexed)     Status: Abnormal   Collection Time: 04/03/23  5:50 PM  Result Value Ref Range Status   Enterococcus faecalis NOT DETECTED NOT DETECTED Final   Enterococcus Faecium NOT DETECTED NOT DETECTED Final   Listeria monocytogenes NOT DETECTED NOT DETECTED Final   Staphylococcus species NOT DETECTED NOT DETECTED Final   Staphylococcus aureus (BCID) NOT DETECTED NOT DETECTED Final   Staphylococcus epidermidis NOT DETECTED NOT DETECTED Final   Staphylococcus lugdunensis NOT DETECTED NOT DETECTED Final   Streptococcus species NOT DETECTED NOT DETECTED Final   Streptococcus agalactiae NOT DETECTED NOT DETECTED Final   Streptococcus pneumoniae NOT DETECTED NOT DETECTED Final   Streptococcus pyogenes NOT DETECTED NOT DETECTED Final   A.calcoaceticus-baumannii NOT DETECTED NOT DETECTED Final   Bacteroides fragilis NOT DETECTED NOT DETECTED Final   Enterobacterales DETECTED (A) NOT DETECTED Final    Comment: Enterobacterales represent a large order of gram negative bacteria, not a single organism. CRITICAL RESULT CALLED TO, READ BACK BY AND VERIFIED WITH: PHARMD T.GREEN AT 1138 ON 04/04/2023 BY T.SAAD.    Enterobacter cloacae complex NOT DETECTED NOT DETECTED Final   Escherichia coli DETECTED (A) NOT DETECTED Final    Comment: CRITICAL RESULT CALLED TO, READ BACK BY AND VERIFIED  WITH: PHARMD T.GREEN AT 1138 ON 04/04/2023 BY T.SAAD.    Klebsiella aerogenes NOT DETECTED NOT DETECTED Final   Klebsiella oxytoca NOT DETECTED NOT DETECTED Final   Klebsiella pneumoniae NOT DETECTED NOT DETECTED Final   Proteus species NOT DETECTED NOT DETECTED Final   Salmonella species NOT DETECTED NOT DETECTED Final   Serratia marcescens NOT DETECTED NOT DETECTED Final   Haemophilus influenzae NOT DETECTED NOT DETECTED Final   Neisseria meningitidis NOT  DETECTED NOT DETECTED Final   Pseudomonas aeruginosa NOT DETECTED NOT DETECTED Final   Stenotrophomonas maltophilia NOT DETECTED NOT DETECTED Final   Candida albicans NOT DETECTED NOT DETECTED Final   Candida auris NOT DETECTED NOT DETECTED Final   Candida glabrata NOT DETECTED NOT DETECTED Final   Candida krusei NOT DETECTED NOT DETECTED Final   Candida parapsilosis NOT DETECTED NOT DETECTED Final   Candida tropicalis NOT DETECTED NOT DETECTED Final   Cryptococcus neoformans/gattii NOT DETECTED NOT DETECTED Final   CTX-M ESBL DETECTED (A) NOT DETECTED Final    Comment: CRITICAL RESULT CALLED TO, READ BACK BY AND VERIFIED WITH: PHARMD T.GREEN AT 1138 ON 04/04/2023 BY T.SAAD. (NOTE) Extended spectrum beta-lactamase detected. Recommend a carbapenem as initial therapy.      Carbapenem resistance IMP NOT DETECTED NOT DETECTED Final   Carbapenem resistance KPC NOT DETECTED NOT DETECTED Final   Carbapenem resistance NDM NOT DETECTED NOT DETECTED Final   Carbapenem resist OXA 48 LIKE NOT DETECTED NOT DETECTED Final   Carbapenem resistance VIM NOT DETECTED NOT DETECTED Final    Comment: Performed at [facility], [address] [place], [place] [address]  Urine Culture (for pregnant, neutropenic or urologic patients or patients with an indwelling urinary catheter)     Status: Abnormal   Collection Time: 04/04/23 11:30 AM   Specimen: Urine, Clean Catch  Result Value Ref Range Status   Specimen Description   Final    URINE,  CLEAN CATCH Performed at [facility], [address] [place], [place] [address]    Special Requests   Final    NONE Performed at [facility], [address], [place], [place] [address]    Culture (A)  Final    70,000 COLONIES/mL ENTEROCOCCUS FAECALIS 10,000 COLONIES/mL STENOTROPHOMONAS MALTOPHILIA VANCOMYCIN RESISTANT ENTEROCOCCUS ISOLATED    Report Status 04/09/2023 FINAL  Final   Organism ID, Bacteria ENTEROCOCCUS FAECALIS (A)  Final   Organism ID, Bacteria STENOTROPHOMONAS MALTOPHILIA (A)  Final      Susceptibility   Enterococcus faecalis - MIC*    AMPICILLIN <=2 SENSITIVE Sensitive     NITROFURANTOIN <=16 SENSITIVE Sensitive     VANCOMYCIN >=32 RESISTANT Resistant     LINEZOLID 2 SENSITIVE Sensitive     * 70,000 COLONIES/mL ENTEROCOCCUS FAECALIS   Stenotrophomonas maltophilia - MIC*    LEVOFLOXACIN 2 SENSITIVE Sensitive     TRIMETH/SULFA <=20 SENSITIVE Sensitive     * 10,000 COLONIES/mL STENOTROPHOMONAS MALTOPHILIA         Radiology Studies: No results found.      Scheduled Meds:  DULoxetine  30 mg Oral Daily   enoxaparin (LOVENOX) injection  40 mg Subcutaneous Q24H   feeding supplement  237 mL Oral TID BM   gabapentin  400 mg Oral BID   multivitamin with minerals  1 tablet Oral Daily   oxybutynin  10 mg Oral Daily   sodium chloride flush  3 mL Intravenous Q12H   Continuous Infusions:  meropenem (MERREM) IV 1 g (04/10/23 0600)          Glade Lloyd, MD [facility] 04/10/2023, 7:44 AM

## 2023-04-11 DIAGNOSIS — E440 Moderate protein-calorie malnutrition: Secondary | ICD-10-CM | POA: Insufficient documentation

## 2023-04-11 DIAGNOSIS — N10 Acute tubulo-interstitial nephritis: Secondary | ICD-10-CM | POA: Diagnosis not present

## 2023-04-11 MED ORDER — SULFAMETHOXAZOLE-TRIMETHOPRIM 800-160 MG PO TABS
2.0000 | ORAL_TABLET | ORAL | Status: AC
  Administered 2023-04-11: 2 via ORAL
  Filled 2023-04-11: qty 2

## 2023-04-11 NOTE — Progress Notes (Signed)
Pt was due for IV abx meropenem 0600, pt had removed his IV. Pt refused to have another IV inserted stated, "doctor said I was finished with the antibiotic and am leaving today".

## 2023-04-11 NOTE — Progress Notes (Signed)
Discharge package printed and instructions given to sister, Marcelino Duster.

## 2023-04-11 NOTE — Discharge Summary (Signed)
Physician Discharge Summary  Bradley Brandt ZOX:[id number] DOB: [birth year] DOA: 04/03/2023  PCP: Donell Beers, FNP  Admit date: 04/03/2023 Discharge date: 04/11/2023  Admitted From: Home Disposition: Home  Recommendations for Outpatient Follow-up:  Follow up with PCP in 1-2 weeks Please obtain BMP/CBC/magnesium/phosphorus in one week   Discharge Condition: Stable CODE STATUS: Full code Diet recommendation: Regular diet  Discharge summary:  42 year old multiple sclerosis and bipolar disorder presented with abdominal pain, nausea and vomiting for the last 3 weeks, WBC count was 28.9, creatinine was 1.76.  CT scan with bilateral renal stranding suspected of pyelonephritis.  Patient does have suprapubic catheter.  Cultures were collected and he was started on IV fluids and IV antibiotics.  He was found to have ESBL E. coli bacteremia.  Treated with meropenem for 7 days.  Blood culture with ESBL E. coli.  Urine culture with Enterococcus faecalis which is possibly colonizer.  Clinically improved.  Discharging after completing 7 days of antibiotic therapy.  Continue all long-term medications including care of multiple sclerosis.  Suprapubic catheter was exchanged today before discharge.  It was not exchanged when found with infection.  Discharge Diagnoses:  Principal Problem:   Acute pyelonephritis Active Problems:   Relapsing remitting multiple sclerosis (HCC)   Generalized anxiety disorder   AKI (acute kidney injury) (HCC)   Malnutrition of moderate degree    Discharge Instructions  Discharge Instructions     Diet general   Complete by: As directed    Increase activity slowly   Complete by: As directed       Allergies as of 04/11/2023       Reactions   Lithium Rash        Medication List     STOP taking these medications    QUEtiapine 200 MG tablet Commonly known as: SEROQUEL       TAKE these medications    baclofen 20 MG tablet Commonly known  as: LIORESAL Take 1 tablet (20 mg total) by mouth 3 (three) times daily.   DULoxetine 30 MG capsule Commonly known as: CYMBALTA Take 1 capsule (30 mg total) by mouth daily.   gabapentin 400 MG capsule Commonly known as: NEURONTIN Take 1 capsule (400 mg total) by mouth 3 (three) times daily.   oxybutynin 10 MG 24 hr tablet Commonly known as: DITROPAN-XL Take 1 tablet (10 mg total) by mouth daily.   Tysabri 300 MG/15ML injection Generic drug: natalizumab Inject 15 mLs (300 mg total) into the vein every 28 (twenty-eight) days.        Allergies  Allergen Reactions   Lithium Rash    Consultations: None   Procedures/Studies: CT ABDOMEN PELVIS W CONTRAST  Result Date: 04/03/2023 CLINICAL DATA:  Nonlocalized abdominal pain. History of multiple sclerosis. EXAM: CT ABDOMEN AND PELVIS WITH CONTRAST TECHNIQUE: Multidetector CT imaging of the abdomen and pelvis was performed using the standard protocol following bolus administration of intravenous contrast. RADIATION DOSE REDUCTION: This exam was performed according to the departmental dose-optimization program which includes automated exposure control, adjustment of the mA and/or kV according to patient size and/or use of iterative reconstruction technique. CONTRAST:  OMNIPAQUE IOHEXOL 300 MG/ML  SOLN COMPARISON:  09/14/2022 FINDINGS: Mild to moderate motion degradation throughout the abdomen and pelvis. Lower chest: No gross pulmonary abnormality. Normal heart size without pericardial or pleural effusion. Hepatobiliary: Normal liver. Normal gallbladder, without biliary ductal dilatation. Pancreas: Normal, without mass or ductal dilatation. Spleen: Normal in size, without focal abnormality. Adrenals/Urinary Tract: Normal adrenal glands. 1.5  cm upper pole left renal low-density lesion is not readily apparent on the prior. Suboptimal corticomedullary differentiation is nonspecific and symmetric. 6 mm inter/lower pole left renal collecting  system calculus. No hydronephrosis. Suprapubic catheter. The bladder wall is mildly thickened. Stomach/Bowel: Normal stomach, without wall thickening. Normal colon. Appendix not visualized. Normal small bowel. Vascular/Lymphatic: Aortic atherosclerosis. No abdominopelvic adenopathy. Reproductive: Normal prostate. Testicles positioned high in the inguinal canal, presumably transient given absence on the prior. Other: No significant free fluid. No abdominal ascites. No free intraperitoneal air. Musculoskeletal: No acute osseous abnormality. IMPRESSION: 1. Motion degraded exam. 2. Left nephrolithiasis without obstructive uropathy. 3. Suboptimal corticomedullary differentiation in both kidneys is nonspecific. Can be seen with renal insufficiency or pyelonephritis. 4. Suprapubic catheter in place with mild bladder wall thickening which can be seen with cystitis. 5. Upper pole left renal 1.5 cm hypoattenuating lesion is not readily apparent on the prior. Although this could represent an interval cyst or complex cyst, if the patient is diagnosed with left-sided pyelonephritis, focal infection (lobar nephronia) would be a concern. 6. Lack of visualization of the appendix. Electronically Signed   By: Jeronimo Greaves M.D.   On: 04/03/2023 19:55   DG Chest 1 View  Result Date: 04/03/2023 CLINICAL DATA:  dyspnea EXAM: CHEST  1 VIEW COMPARISON:  August 01, 2022 FINDINGS: The cardiomediastinal silhouette is unchanged in contour. No pleural effusion. No pneumothorax. No acute pleuroparenchymal abnormality. IMPRESSION: No acute cardiopulmonary abnormality. Electronically Signed   By: Meda Klinefelter M.D.   On: 04/03/2023 17:18   (Echo, Carotid, EGD, Colonoscopy, ERCP)    Subjective: Patient seen in the morning rounds.  He tells me he is going home as told by the doctor yesterday.  Denies any complaints.  Afebrile.  He was due for another dose of meropenem today, however he declined to have IV line placed.  He was given  double dose of Bactrim before discharge.   Discharge Exam: Vitals:   04/10/23 2152 04/11/23 0541  BP: 113/72 95/76  Pulse: 96 78  Resp: 17 17  Temp: 97.8 F (36.6 C) 98.1 F (36.7 C)  SpO2: 100% 98%   Vitals:   04/10/23 0607 04/10/23 1410 04/10/23 2152 04/11/23 0541  BP: (!) 123/102 109/65 113/72 95/76  Pulse: 80 76 96 78  Resp: 15 18 17 17   Temp: 98.3 F (36.8 C) 97.7 F (36.5 C) 97.8 F (36.6 C) 98.1 F (36.7 C)  TempSrc: Oral Oral Oral Oral  SpO2: 99% 100% 100% 98%  Weight:      Height:        General: Pt is alert, awake, not in acute distress Frail and debilitated.  He has generalized weakness, more pronounced weakness on the lower extremities. Cardiovascular: RRR, S1/S2 +, no rubs, no gallops Respiratory: CTA bilaterally, no wheezing, no rhonchi Abdominal: Soft, NT, ND, bowel sounds +, suprapubic catheter exchanged, clean and dry.  Urine is normal color. Extremities: no edema, no cyanosis    The results of significant diagnostics from this hospitalization (including imaging, microbiology, ancillary and laboratory) are listed below for reference.     Microbiology: Recent Results (from the past 240 hour(s))  Blood culture (routine x 2)     Status: Abnormal   Collection Time: 04/03/23  5:50 PM   Specimen: Site Not Specified; Blood  Result Value Ref Range Status   Specimen Description   Final    SITE NOT SPECIFIED BLOOD Performed at [facility], [address], [place], [place] [address]  Special Requests   Final    BOTTLES DRAWN AEROBIC AND ANAEROBIC Blood Culture adequate volume Performed at [facility], [address], [place], [place] [address]    Culture  Setup Time   Final    GRAM NEGATIVE RODS IN BOTH AEROBIC AND ANAEROBIC BOTTLES CRITICAL RESULT CALLED TO, READ BACK BY AND VERIFIED WITH: PHARMD T.GREEN AT 1138 ON 04/04/2023 BY T.SAAD. Performed at [facility], [address], [place], [place] [address]     Culture (A)  Final    ESCHERICHIA COLI Confirmed Extended Spectrum Beta-Lactamase Producer (ESBL).  In bloodstream infections from ESBL organisms, carbapenems are preferred over piperacillin/tazobactam. They are shown to have a lower risk of mortality.    Report Status 04/06/2023 FINAL  Final   Organism ID, Bacteria ESCHERICHIA COLI  Final      Susceptibility   Escherichia coli - MIC*    AMPICILLIN >=32 RESISTANT Resistant     CEFEPIME >=32 RESISTANT Resistant     CEFTAZIDIME RESISTANT Resistant     CEFTRIAXONE >=64 RESISTANT Resistant     CIPROFLOXACIN >=4 RESISTANT Resistant     GENTAMICIN <=1 SENSITIVE Sensitive     IMIPENEM <=0.25 SENSITIVE Sensitive     TRIMETH/SULFA <=20 SENSITIVE Sensitive     AMPICILLIN/SULBACTAM 16 INTERMEDIATE Intermediate     PIP/TAZO <=4 SENSITIVE Sensitive ug/mL    * ESCHERICHIA COLI  Blood culture (routine x 2)     Status: Abnormal   Collection Time: 04/03/23  5:50 PM   Specimen: Site Not Specified; Blood  Result Value Ref Range Status   Specimen Description   Final    SITE NOT SPECIFIED BLOOD Performed at [facility], [address], [place], [place] [address]    Special Requests   Final    BOTTLES DRAWN AEROBIC AND ANAEROBIC Blood Culture adequate volume Performed at [facility], [address], [place], [place] [address]    Culture  Setup Time   Final    GRAM NEGATIVE RODS IN BOTH AEROBIC AND ANAEROBIC BOTTLES CRITICAL VALUE NOTED.  VALUE IS CONSISTENT WITH PREVIOUSLY REPORTED AND CALLED VALUE. Performed at [facility], [address], [place], [place] [address]    Culture ESCHERICHIA COLI (A)  Final   Report Status 04/06/2023 FINAL  Final  Blood Culture ID Panel (Reflexed)     Status: Abnormal   Collection Time: 04/03/23  5:50 PM  Result Value Ref Range Status   Enterococcus faecalis NOT DETECTED NOT DETECTED Final   Enterococcus Faecium NOT DETECTED NOT DETECTED Final   Listeria monocytogenes NOT  DETECTED NOT DETECTED Final   Staphylococcus species NOT DETECTED NOT DETECTED Final   Staphylococcus aureus (BCID) NOT DETECTED NOT DETECTED Final   Staphylococcus epidermidis NOT DETECTED NOT DETECTED Final   Staphylococcus lugdunensis NOT DETECTED NOT DETECTED Final   Streptococcus species NOT DETECTED NOT DETECTED Final   Streptococcus agalactiae NOT DETECTED NOT DETECTED Final   Streptococcus pneumoniae NOT DETECTED NOT DETECTED Final   Streptococcus pyogenes NOT DETECTED NOT DETECTED Final   A.calcoaceticus-baumannii NOT DETECTED NOT DETECTED Final   Bacteroides fragilis NOT DETECTED NOT DETECTED Final   Enterobacterales DETECTED (A) NOT DETECTED Final    Comment: Enterobacterales represent a large order of gram negative bacteria, not a single organism. CRITICAL RESULT CALLED TO, READ BACK BY AND VERIFIED WITH: PHARMD T.GREEN AT 1138 ON 04/04/2023 BY T.SAAD.    Enterobacter cloacae complex NOT DETECTED NOT DETECTED Final   Escherichia coli DETECTED (  A) NOT DETECTED Final    Comment: CRITICAL RESULT CALLED TO, READ BACK BY AND VERIFIED WITH: PHARMD T.GREEN AT 1138 ON 04/04/2023 BY T.SAAD.    Klebsiella aerogenes NOT DETECTED NOT DETECTED Final   Klebsiella oxytoca NOT DETECTED NOT DETECTED Final   Klebsiella pneumoniae NOT DETECTED NOT DETECTED Final   Proteus species NOT DETECTED NOT DETECTED Final   Salmonella species NOT DETECTED NOT DETECTED Final   Serratia marcescens NOT DETECTED NOT DETECTED Final   Haemophilus influenzae NOT DETECTED NOT DETECTED Final   Neisseria meningitidis NOT DETECTED NOT DETECTED Final   Pseudomonas aeruginosa NOT DETECTED NOT DETECTED Final   Stenotrophomonas maltophilia NOT DETECTED NOT DETECTED Final   Candida albicans NOT DETECTED NOT DETECTED Final   Candida auris NOT DETECTED NOT DETECTED Final   Candida glabrata NOT DETECTED NOT DETECTED Final   Candida krusei NOT DETECTED NOT DETECTED Final   Candida parapsilosis NOT DETECTED NOT DETECTED  Final   Candida tropicalis NOT DETECTED NOT DETECTED Final   Cryptococcus neoformans/gattii NOT DETECTED NOT DETECTED Final   CTX-M ESBL DETECTED (A) NOT DETECTED Final    Comment: CRITICAL RESULT CALLED TO, READ BACK BY AND VERIFIED WITH: PHARMD T.GREEN AT 1138 ON 04/04/2023 BY T.SAAD. (NOTE) Extended spectrum beta-lactamase detected. Recommend a carbapenem as initial therapy.      Carbapenem resistance IMP NOT DETECTED NOT DETECTED Final   Carbapenem resistance KPC NOT DETECTED NOT DETECTED Final   Carbapenem resistance NDM NOT DETECTED NOT DETECTED Final   Carbapenem resist OXA 48 LIKE NOT DETECTED NOT DETECTED Final   Carbapenem resistance VIM NOT DETECTED NOT DETECTED Final    Comment: Performed at [facility], [address], [place], [place] [address]  Urine Culture (for pregnant, neutropenic or urologic patients or patients with an indwelling urinary catheter)     Status: Abnormal   Collection Time: 04/04/23 11:30 AM   Specimen: Urine, Clean Catch  Result Value Ref Range Status   Specimen Description   Final    URINE, CLEAN CATCH Performed at [facility], [address], [place], [place] [address]    Special Requests   Final    NONE Performed at [facility], [address] [place], [place] [address]    Culture (A)  Final    70,000 COLONIES/mL ENTEROCOCCUS FAECALIS 10,000 COLONIES/mL STENOTROPHOMONAS MALTOPHILIA VANCOMYCIN RESISTANT ENTEROCOCCUS ISOLATED    Report Status 04/09/2023 FINAL  Final   Organism ID, Bacteria ENTEROCOCCUS FAECALIS (A)  Final   Organism ID, Bacteria STENOTROPHOMONAS MALTOPHILIA (A)  Final      Susceptibility   Enterococcus faecalis - MIC*    AMPICILLIN <=2 SENSITIVE Sensitive     NITROFURANTOIN <=16 SENSITIVE Sensitive     VANCOMYCIN >=32 RESISTANT Resistant     LINEZOLID 2 SENSITIVE Sensitive     * 70,000 COLONIES/mL ENTEROCOCCUS FAECALIS   Stenotrophomonas maltophilia - MIC*    LEVOFLOXACIN  2 SENSITIVE Sensitive     TRIMETH/SULFA <=20 SENSITIVE Sensitive     * 10,000 COLONIES/mL STENOTROPHOMONAS MALTOPHILIA     Labs: BNP (last 3 results) No results for input(s): "BNP" in the last 8760 hours. Basic Metabolic Panel: Recent Labs  Lab 04/05/23 0322 04/06/23 0319 04/07/23 0333 04/10/23 0348  NA 138 138 138 136  K 3.7 3.6 4.5 4.3  CL 107 104 105 99  CO2 21* 26 26 28   GLUCOSE 119* 94 103* 102*  BUN 20 17 21* 27*  CREATININE 1.27* 1.08 0.85 0.84  CALCIUM 8.1*  8.4* 8.4* 8.8*  MG 2.1 1.6* 1.8 2.0   Liver Function Tests: Recent Labs  Lab 04/05/23 0322 04/06/23 0319  AST 39 31  ALT 43 36  ALKPHOS 76 63  BILITOT 0.4 0.5  PROT 6.2* 5.9*  ALBUMIN 2.1* 1.8*   No results for input(s): "LIPASE", "AMYLASE" in the last 168 hours. No results for input(s): "AMMONIA" in the last 168 hours. CBC: Recent Labs  Lab 04/05/23 0322 04/06/23 0319 04/07/23 0333 04/10/23 0348  WBC 13.4* 7.0 4.7 6.4  NEUTROABS  --   --  2.0 3.0  HGB 9.1* 8.6* 8.6* 8.9*  HCT 28.6* 25.8* 26.7* 27.3*  MCV 97.3 94.5 95.0 94.5  PLT 245 234 221 259   Cardiac Enzymes: No results for input(s): "CKTOTAL", "CKMB", "CKMBINDEX", "TROPONINI" in the last 168 hours. BNP: Invalid input(s): "POCBNP" CBG: No results for input(s): "GLUCAP" in the last 168 hours. D-Dimer No results for input(s): "DDIMER" in the last 72 hours. Hgb A1c No results for input(s): "HGBA1C" in the last 72 hours. Lipid Profile No results for input(s): "CHOL", "HDL", "LDLCALC", "TRIG", "CHOLHDL", "LDLDIRECT" in the last 72 hours. Thyroid function studies No results for input(s): "TSH", "T4TOTAL", "T3FREE", "THYROIDAB" in the last 72 hours.  Invalid input(s): "FREET3" Anemia work up No results for input(s): "VITAMINB12", "FOLATE", "FERRITIN", "TIBC", "IRON", "RETICCTPCT" in the last 72 hours. Urinalysis    Component Value Date/Time   COLORURINE YELLOW 04/03/2023 1720   APPEARANCEUR HAZY (A) 04/03/2023 1720   LABSPEC 1.011  04/03/2023 1720   PHURINE 5.0 04/03/2023 1720   GLUCOSEU NEGATIVE 04/03/2023 1720   HGBUR MODERATE (A) 04/03/2023 1720   BILIRUBINUR NEGATIVE 04/03/2023 1720   BILIRUBINUR neg 02/06/2023 1001   KETONESUR NEGATIVE 04/03/2023 1720   PROTEINUR 100 (A) 04/03/2023 1720   UROBILINOGEN 0.2 02/06/2023 1001   UROBILINOGEN 1.0 01/02/2013 1453   NITRITE NEGATIVE 04/03/2023 1720   LEUKOCYTESUR LARGE (A) 04/03/2023 1720   Sepsis Labs Recent Labs  Lab 04/05/23 0322 04/06/23 0319 04/07/23 0333 04/10/23 0348  WBC 13.4* 7.0 4.7 6.4   Microbiology Recent Results (from the past 240 hour(s))  Blood culture (routine x 2)     Status: Abnormal   Collection Time: 04/03/23  5:50 PM   Specimen: Site Not Specified; Blood  Result Value Ref Range Status   Specimen Description   Final    SITE NOT SPECIFIED BLOOD Performed at [facility], [address], [place], [place] [address]    Special Requests   Final    BOTTLES DRAWN AEROBIC AND ANAEROBIC Blood Culture adequate volume Performed at [facility], [address], [place], [place] [address]    Culture  Setup Time   Final    GRAM NEGATIVE RODS IN BOTH AEROBIC AND ANAEROBIC BOTTLES CRITICAL RESULT CALLED TO, READ BACK BY AND VERIFIED WITH: PHARMD T.GREEN AT 1138 ON 04/04/2023 BY T.SAAD. Performed at [facility], [address] [place], [place] [address]    Culture (A)  Final    ESCHERICHIA COLI Confirmed Extended Spectrum Beta-Lactamase Producer (ESBL).  In bloodstream infections from ESBL organisms, carbapenems are preferred over piperacillin/tazobactam. They are shown to have a lower risk of mortality.    Report Status 04/06/2023 FINAL  Final   Organism ID, Bacteria ESCHERICHIA COLI  Final      Susceptibility   Escherichia coli - MIC*    AMPICILLIN >=32 RESISTANT Resistant     CEFEPIME >=32 RESISTANT Resistant     CEFTAZIDIME RESISTANT Resistant  CEFTRIAXONE >=64 RESISTANT Resistant     CIPROFLOXACIN  >=4 RESISTANT Resistant     GENTAMICIN <=1 SENSITIVE Sensitive     IMIPENEM <=0.25 SENSITIVE Sensitive     TRIMETH/SULFA <=20 SENSITIVE Sensitive     AMPICILLIN/SULBACTAM 16 INTERMEDIATE Intermediate     PIP/TAZO <=4 SENSITIVE Sensitive ug/mL    * ESCHERICHIA COLI  Blood culture (routine x 2)     Status: Abnormal   Collection Time: 04/03/23  5:50 PM   Specimen: Site Not Specified; Blood  Result Value Ref Range Status   Specimen Description   Final    SITE NOT SPECIFIED BLOOD Performed at [facility], [address], [place], [place] [address]    Special Requests   Final    BOTTLES DRAWN AEROBIC AND ANAEROBIC Blood Culture adequate volume Performed at [facility], [address], [place], [place] [address]    Culture  Setup Time   Final    GRAM NEGATIVE RODS IN BOTH AEROBIC AND ANAEROBIC BOTTLES CRITICAL VALUE NOTED.  VALUE IS CONSISTENT WITH PREVIOUSLY REPORTED AND CALLED VALUE. Performed at [facility], [address], [place], [place] [address]    Culture ESCHERICHIA COLI (A)  Final   Report Status 04/06/2023 FINAL  Final  Blood Culture ID Panel (Reflexed)     Status: Abnormal   Collection Time: 04/03/23  5:50 PM  Result Value Ref Range Status   Enterococcus faecalis NOT DETECTED NOT DETECTED Final   Enterococcus Faecium NOT DETECTED NOT DETECTED Final   Listeria monocytogenes NOT DETECTED NOT DETECTED Final   Staphylococcus species NOT DETECTED NOT DETECTED Final   Staphylococcus aureus (BCID) NOT DETECTED NOT DETECTED Final   Staphylococcus epidermidis NOT DETECTED NOT DETECTED Final   Staphylococcus lugdunensis NOT DETECTED NOT DETECTED Final   Streptococcus species NOT DETECTED NOT DETECTED Final   Streptococcus agalactiae NOT DETECTED NOT DETECTED Final   Streptococcus pneumoniae NOT DETECTED NOT DETECTED Final   Streptococcus pyogenes NOT DETECTED NOT DETECTED Final   A.calcoaceticus-baumannii NOT DETECTED NOT DETECTED Final    Bacteroides fragilis NOT DETECTED NOT DETECTED Final   Enterobacterales DETECTED (A) NOT DETECTED Final    Comment: Enterobacterales represent a large order of gram negative bacteria, not a single organism. CRITICAL RESULT CALLED TO, READ BACK BY AND VERIFIED WITH: PHARMD T.GREEN AT 1138 ON 04/04/2023 BY T.SAAD.    Enterobacter cloacae complex NOT DETECTED NOT DETECTED Final   Escherichia coli DETECTED (A) NOT DETECTED Final    Comment: CRITICAL RESULT CALLED TO, READ BACK BY AND VERIFIED WITH: PHARMD T.GREEN AT 1138 ON 04/04/2023 BY T.SAAD.    Klebsiella aerogenes NOT DETECTED NOT DETECTED Final   Klebsiella oxytoca NOT DETECTED NOT DETECTED Final   Klebsiella pneumoniae NOT DETECTED NOT DETECTED Final   Proteus species NOT DETECTED NOT DETECTED Final   Salmonella species NOT DETECTED NOT DETECTED Final   Serratia marcescens NOT DETECTED NOT DETECTED Final   Haemophilus influenzae NOT DETECTED NOT DETECTED Final   Neisseria meningitidis NOT DETECTED NOT DETECTED Final   Pseudomonas aeruginosa NOT DETECTED NOT DETECTED Final   Stenotrophomonas maltophilia NOT DETECTED NOT DETECTED Final   Candida albicans NOT DETECTED NOT DETECTED Final   Candida auris NOT DETECTED NOT DETECTED Final   Candida glabrata NOT DETECTED NOT DETECTED Final   Candida krusei NOT DETECTED NOT DETECTED Final   Candida parapsilosis NOT DETECTED NOT DETECTED Final   Candida tropicalis NOT DETECTED NOT DETECTED Final   Cryptococcus neoformans/gattii NOT DETECTED NOT DETECTED  Final   CTX-M ESBL DETECTED (A) NOT DETECTED Final    Comment: CRITICAL RESULT CALLED TO, READ BACK BY AND VERIFIED WITH: PHARMD T.GREEN AT 1138 ON 04/04/2023 BY T.SAAD. (NOTE) Extended spectrum beta-lactamase detected. Recommend a carbapenem as initial therapy.      Carbapenem resistance IMP NOT DETECTED NOT DETECTED Final   Carbapenem resistance KPC NOT DETECTED NOT DETECTED Final   Carbapenem resistance NDM NOT DETECTED NOT DETECTED  Final   Carbapenem resist OXA 48 LIKE NOT DETECTED NOT DETECTED Final   Carbapenem resistance VIM NOT DETECTED NOT DETECTED Final    Comment: Performed at [facility], [address] [place], [place] [address]  Urine Culture (for pregnant, neutropenic or urologic patients or patients with an indwelling urinary catheter)     Status: Abnormal   Collection Time: 04/04/23 11:30 AM   Specimen: Urine, Clean Catch  Result Value Ref Range Status   Specimen Description   Final    URINE, CLEAN CATCH Performed at [facility], [address] [place], [place] [address]    Special Requests   Final    NONE Performed at [facility], [address] [place], [place] [address]    Culture (A)  Final    70,000 COLONIES/mL ENTEROCOCCUS FAECALIS 10,000 COLONIES/mL STENOTROPHOMONAS MALTOPHILIA VANCOMYCIN RESISTANT ENTEROCOCCUS ISOLATED    Report Status 04/09/2023 FINAL  Final   Organism ID, Bacteria ENTEROCOCCUS FAECALIS (A)  Final   Organism ID, Bacteria STENOTROPHOMONAS MALTOPHILIA (A)  Final      Susceptibility   Enterococcus faecalis - MIC*    AMPICILLIN <=2 SENSITIVE Sensitive     NITROFURANTOIN <=16 SENSITIVE Sensitive     VANCOMYCIN >=32 RESISTANT Resistant     LINEZOLID 2 SENSITIVE Sensitive     * 70,000 COLONIES/mL ENTEROCOCCUS FAECALIS   Stenotrophomonas maltophilia - MIC*    LEVOFLOXACIN 2 SENSITIVE Sensitive     TRIMETH/SULFA <=20 SENSITIVE Sensitive     * 10,000 COLONIES/mL STENOTROPHOMONAS MALTOPHILIA     Time coordinating discharge:  35 minutes  SIGNED:   Dorcas Carrow, MD  [facility] 04/11/2023, 12:34 PM

## 2023-04-11 NOTE — Plan of Care (Signed)
  Problem: Coping: Goal: Level of anxiety will decrease Outcome: Progressing   Problem: Pain Managment: Goal: General experience of comfort will improve Outcome: Progressing   Problem: Safety: Goal: Ability to remain free from injury will improve Outcome: Progressing   

## 2023-04-11 NOTE — Plan of Care (Signed)
  Problem: Pain Managment: Goal: General experience of comfort will improve Outcome: Progressing   Problem: Safety: Goal: Ability to remain free from injury will improve Outcome: Progressing   

## 2023-04-11 NOTE — TOC Transition Note (Signed)
Transition of Care [facility]) - CM/SW Discharge Note   Patient Details  Name: Bradley Brandt MRN: [id number] Date of Birth: May 30, 1981  Transition of Care [facility]) CM/SW Contact:  Amada Jupiter, LCSW Phone Number: 04/11/2023, 1:19 PM   Clinical Narrative:    Pt medically cleared for dc home today.  Pt and family aware/ agreeable and family plans to provide the dc transportation themselves.  No further TOC needs.   Final next level of care: Home/Self Care Barriers to Discharge: Barriers Resolved   Patient Goals and CMS Choice      Discharge Placement                    Name of family member notified: sister, [facility]    Discharge Plan and Services Additional resources added to the After Visit Summary for   In-house Referral: Clinical Social Work              DME Arranged: N/A DME Agency: NA                  Social Determinants of Health (SDOH) Interventions SDOH Screenings   Food Insecurity: Patient Declined (04/03/2023)  Housing: Patient Declined (04/03/2023)  Transportation Needs: No Transportation Needs (04/03/2023)  Utilities: Not At Risk (04/03/2023)  Depression (PHQ2-9): Low Risk  (02/06/2023)  Social Connections: Unknown (10/17/2021)   Received from [facility], [facility]  Tobacco Use: High Risk (04/03/2023)     Readmission Risk Interventions    04/04/2023    3:02 PM  Readmission Risk Prevention Plan  Transportation Screening Complete  Medication Review (RN Care Manager) Complete  PCP or Specialist appointment within 3-5 days of discharge Complete  HRI or Home Care Consult Complete  SW Recovery Care/Counseling Consult Complete  Skilled Nursing Facility Not Applicable

## 2023-04-12 DIAGNOSIS — G35 Multiple sclerosis: Secondary | ICD-10-CM | POA: Diagnosis not present

## 2023-04-13 DIAGNOSIS — G35 Multiple sclerosis: Secondary | ICD-10-CM | POA: Diagnosis not present

## 2023-04-14 DIAGNOSIS — G35 Multiple sclerosis: Secondary | ICD-10-CM | POA: Diagnosis not present

## 2023-04-15 DIAGNOSIS — G35 Multiple sclerosis: Secondary | ICD-10-CM | POA: Diagnosis not present

## 2023-04-16 ENCOUNTER — Encounter: Payer: Self-pay | Admitting: Neurology

## 2023-04-16 DIAGNOSIS — G35 Multiple sclerosis: Secondary | ICD-10-CM | POA: Diagnosis not present

## 2023-04-18 DIAGNOSIS — G35 Multiple sclerosis: Secondary | ICD-10-CM | POA: Diagnosis not present

## 2023-04-19 ENCOUNTER — Other Ambulatory Visit: Payer: Medicaid Other

## 2023-04-19 ENCOUNTER — Ambulatory Visit (INDEPENDENT_AMBULATORY_CARE_PROVIDER_SITE_OTHER): Payer: Medicaid Other

## 2023-04-19 VITALS — BP 120/73 | HR 94 | Temp 98.2°F | Resp 14 | Ht 71.0 in | Wt 152.0 lb

## 2023-04-19 DIAGNOSIS — G35 Multiple sclerosis: Secondary | ICD-10-CM

## 2023-04-19 MED ORDER — SODIUM CHLORIDE 0.9 % IV SOLN
300.0000 mg | Freq: Once | INTRAVENOUS | Status: AC
  Administered 2023-04-19: 300 mg via INTRAVENOUS
  Filled 2023-04-19: qty 15

## 2023-04-19 MED ORDER — LORATADINE 10 MG PO TABS
10.0000 mg | ORAL_TABLET | Freq: Once | ORAL | Status: AC
  Administered 2023-04-19: 10 mg via ORAL
  Filled 2023-04-19: qty 1

## 2023-04-19 MED ORDER — ACETAMINOPHEN 325 MG PO TABS
650.0000 mg | ORAL_TABLET | Freq: Once | ORAL | Status: AC
  Administered 2023-04-19: 650 mg via ORAL
  Filled 2023-04-19: qty 2

## 2023-04-19 NOTE — Progress Notes (Signed)
Diagnosis: Multiple Sclerosis  Provider:  Chilton Greathouse MD  Procedure: IV Infusion  IV Type: Peripheral, IV Location: L Hand  Tysabri (Natalizumab), Dose: 300 mg  Infusion Start Time: 1215  Infusion Stop Time: 1315  Post Infusion IV Care: Observation period completed and Peripheral IV Discontinued  Discharge: Condition: Good, Destination: Home . AVS Provided  Performed by:  Adriana Mccallum, RN

## 2023-04-20 ENCOUNTER — Ambulatory Visit (INDEPENDENT_AMBULATORY_CARE_PROVIDER_SITE_OTHER): Payer: Medicaid Other | Admitting: Nurse Practitioner

## 2023-04-20 ENCOUNTER — Encounter: Payer: Self-pay | Admitting: Nurse Practitioner

## 2023-04-20 VITALS — BP 106/85 | HR 92 | Temp 97.7°F

## 2023-04-20 DIAGNOSIS — N10 Acute tubulo-interstitial nephritis: Secondary | ICD-10-CM | POA: Diagnosis not present

## 2023-04-20 DIAGNOSIS — G35 Multiple sclerosis: Secondary | ICD-10-CM

## 2023-04-20 DIAGNOSIS — Z09 Encounter for follow-up examination after completed treatment for conditions other than malignant neoplasm: Secondary | ICD-10-CM | POA: Diagnosis not present

## 2023-04-20 NOTE — Assessment & Plan Note (Signed)
Hospital chart reviewed, including discharge summary Medications reconciled and reviewed with the patient in detail 

## 2023-04-20 NOTE — Assessment & Plan Note (Signed)
He is established with neurology continues natalizumab 300 mg injection Was encouraged to maintain close follow-up with neurology

## 2023-04-20 NOTE — Assessment & Plan Note (Signed)
They would like baclofen to be discontinued because it makes the patient too sleepy We will continue gabapentin 40 mg 3 times daily Encouraged to maintain close follow-up with neurology

## 2023-04-20 NOTE — Assessment & Plan Note (Signed)
Now resolved Patient denies any complaints today Due encouraged to maintain close follow-up with the urologist

## 2023-04-20 NOTE — Patient Instructions (Signed)

## 2023-04-20 NOTE — Progress Notes (Signed)
Established Patient Office Visit  Subjective:  Patient ID: Bradley Brandt, male    DOB: 1980/11/06  Age: 42 y.o. MRN: [id number]  CC: No chief complaint on file.   HPI Bradley Brandt is a 42 y.o. male  has a past medical history of Anxiety, Bipolar 1 disorder (HCC), Bipolar disorder, unspecified (HCC) (01/03/2013), MS (multiple sclerosis) (HCC) (dx 2006), Neuromuscular disorder (HCC), Pneumonia, Right spastic hemiplegia (HCC), Substance abuse (HCC), and Suprapubic catheter (HCC).   Patient presents for hospital discharge follow-up.  Patient was on admission at the hospital from 04/03/2023 to 04/11/2023 for acute pyelonephritis.  Patient was treated with antibiotics while in the hospital   He also had his suprapubic catheter exchanged prior to discharge.  Patient is accompanied by his sister.  They stated that the patient is doing well no problems since he left the hospital.  He has been receiving home health at home. They would like to discontinue baclofen because it makes the patient too sleepy    Past Medical History:  Diagnosis Date   Anxiety    Bipolar 1 disorder (HCC)    Bipolar disorder, unspecified (HCC) 01/03/2013   MS (multiple sclerosis) (HCC) dx 2006   relapsing-remitting right sided weakness   Neuromuscular disorder (HCC)    MS   Pneumonia    Right spastic hemiplegia (HCC)    Substance abuse (HCC)    Suprapubic catheter [facility])     Past Surgical History:  Procedure Laterality Date   BOTOX INJECTION N/A 03/12/2023   Procedure: BOTOX INJECTION;  Surgeon: Crista Elliot, MD;  Location: [facility] ORS;  Service: Urology;  Laterality: N/A;   INSERTION OF SUPRAPUBIC CATHETER N/A 03/12/2023   Procedure: SUPRAPUBIC CATHETER EXCHANGE;  Surgeon: Crista Elliot, MD;  Location: [facility] ORS;  Service: Urology;  Laterality: N/A;   IR CATHETER TUBE CHANGE  10/31/2022   IR CATHETER TUBE CHANGE  11/02/2022   IR FLUORO GUIDED NEEDLE PLC ASPIRATION/INJECTION LOC  09/07/2022   TUMOR REMOVAL   1983   abdomen benign    Family History  Adopted: Yes  Problem Relation Age of Onset   Lupus Mother    Ataxia Neg Hx    Chorea Neg Hx    Dementia Neg Hx    Mental retardation Neg Hx    Migraines Neg Hx    Multiple sclerosis Neg Hx    Neurofibromatosis Neg Hx    Neuropathy Neg Hx    Parkinsonism Neg Hx    Seizures Neg Hx    Stroke Neg Hx     Social History   Socioeconomic History   Marital status: Single    Spouse name: Not on file   Number of children: Not on file   Years of education: Not on file   Highest education level: Not on file  Occupational History   Not on file  Tobacco Use   Smoking status: Every Day    Current packs/day: 1.00    Types: Cigarettes    Passive exposure: Current   Smokeless tobacco: Never  Vaping Use   Vaping status: Former  Substance and Sexual Activity   Alcohol use: Yes    Alcohol/week: 32.0 standard drinks of alcohol    Types: 10 Glasses of wine, 12 Cans of beer, 10 Shots of liquor per week   Drug use: Yes    Frequency: 7.0 times per week    Types: Marijuana    Comment: daily   Sexual activity: Yes    Partners:  Female    Birth control/protection: None  Other Topics Concern   Not on file  Social History Narrative   Lives home alone.   Social Determinants of Health   Financial Resource Strain: Not on file  Food Insecurity: Patient Declined (04/03/2023)   Hunger Vital Sign    Worried About Running Out of Food in the Last Year: Patient declined    Ran Out of Food in the Last Year: Patient declined  Transportation Needs: No Transportation Needs (04/03/2023)   PRAPARE - Administrator, Civil Service (Medical): No    Lack of Transportation (Non-Medical): No  Physical Activity: Not on file  Stress: Not on file  Social Connections: Unknown (10/17/2021)   Received from [facility]   Social Network    Social Network: Not on file  Intimate Partner Violence: Not At Risk (04/03/2023)   Humiliation,  Afraid, Rape, and Kick questionnaire    Fear of Current or Ex-Partner: No    Emotionally Abused: No    Physically Abused: No    Sexually Abused: No    Outpatient Medications Prior to Visit  Medication Sig Dispense Refill   DULoxetine (CYMBALTA) 30 MG capsule Take 1 capsule (30 mg total) by mouth daily. 90 capsule 1   gabapentin (NEURONTIN) 400 MG capsule Take 1 capsule (400 mg total) by mouth 3 (three) times daily. 270 capsule 1   natalizumab (TYSABRI) 300 MG/15ML injection Inject 15 mLs (300 mg total) into the vein every 28 (twenty-eight) days. 15 mL 11   oxybutynin (DITROPAN-XL) 10 MG 24 hr tablet Take 1 tablet (10 mg total) by mouth daily. 90 tablet 1   baclofen (LIORESAL) 20 MG tablet Take 1 tablet (20 mg total) by mouth 3 (three) times daily. (Patient not taking: Reported on 04/04/2023) 90 tablet 4   No facility-administered medications prior to visit.    Allergies  Allergen Reactions   Lithium Rash    ROS Review of Systems  Constitutional:  Negative for appetite change, chills, fatigue and fever.  HENT:  Negative for congestion, postnasal drip, rhinorrhea and sneezing.   Respiratory:  Negative for cough, shortness of breath and wheezing.   Cardiovascular:  Negative for chest pain, palpitations and leg swelling.  Gastrointestinal:  Negative for abdominal pain, constipation, nausea and vomiting.  Genitourinary:  Negative for difficulty urinating, dysuria, flank pain and frequency.  Musculoskeletal:  Negative for arthralgias, back pain, joint swelling and myalgias.  Skin:  Negative for pallor, rash and wound.  Neurological:  Negative for dizziness, facial asymmetry, weakness, numbness and headaches.  Psychiatric/Behavioral:  Negative for behavioral problems, confusion, self-injury and suicidal ideas.       Objective:    Physical Exam Vitals and nursing note reviewed.  Constitutional:      General: He is not in acute distress.    Appearance: Normal appearance. He is not  ill-appearing, toxic-appearing or diaphoretic.  HENT:     Mouth/Throat:     Mouth: Mucous membranes are moist.     Pharynx: Oropharynx is clear. No oropharyngeal exudate or posterior oropharyngeal erythema.  Eyes:     General: No scleral icterus.       Right eye: No discharge.        Left eye: No discharge.     Extraocular Movements: Extraocular movements intact.     Conjunctiva/sclera: Conjunctivae normal.  Cardiovascular:     Rate and Rhythm: Normal rate and regular rhythm.     Pulses: Normal pulses.  Heart sounds: Normal heart sounds. No murmur heard.    No friction rub. No gallop.  Pulmonary:     Effort: Pulmonary effort is normal. No respiratory distress.     Breath sounds: Normal breath sounds. No stridor. No wheezing, rhonchi or rales.  Chest:     Chest wall: No tenderness.  Abdominal:     General: There is no distension.     Palpations: Abdomen is soft.     Tenderness: There is no abdominal tenderness. There is no right CVA tenderness, left CVA tenderness or guarding.  Musculoskeletal:        General: No swelling, tenderness or signs of injury.     Right lower leg: No edema.     Left lower leg: No edema.     Comments: Patient sitting comfortably in a wheelchair  Skin:    General: Skin is warm and dry.     Capillary Refill: Capillary refill takes less than 2 seconds.     Coloration: Skin is not jaundiced or pale.     Findings: No bruising, erythema or lesion.  Neurological:     Mental Status: He is alert.     Comments: Patient is alert but drowsy  Psychiatric:        Mood and Affect: Mood normal.        Behavior: Behavior normal.        Thought Content: Thought content normal.        Judgment: Judgment normal.     BP 106/85   Pulse 92   Temp 97.7 F (36.5 C)   SpO2 97%  Wt Readings from Last 3 Encounters:  04/19/23 152 lb (68.9 kg)  04/03/23 152 lb 12.5 oz (69.3 kg)  03/12/23 160 lb (72.6 kg)    Lab Results  Component Value Date   TSH 1.197  07/30/2022   Lab Results  Component Value Date   WBC 6.4 04/10/2023   HGB 8.9 (L) 04/10/2023   HCT 27.3 (L) 04/10/2023   MCV 94.5 04/10/2023   PLT 259 04/10/2023   Lab Results  Component Value Date   NA 136 04/10/2023   K 4.3 04/10/2023   CO2 28 04/10/2023   GLUCOSE 102 (H) 04/10/2023   BUN 27 (H) 04/10/2023   CREATININE 0.84 04/10/2023   BILITOT 0.5 04/06/2023   ALKPHOS 63 04/06/2023   AST 31 04/06/2023   ALT 36 04/06/2023   PROT 5.9 (L) 04/06/2023   ALBUMIN 1.8 (L) 04/06/2023   CALCIUM 8.8 (L) 04/10/2023   ANIONGAP 9 04/10/2023   EGFR 75 12/12/2021   Lab Results  Component Value Date   CHOL 173 04/13/2021   Lab Results  Component Value Date   HDL 37 (L) 04/13/2021   Lab Results  Component Value Date   LDLCALC 114 (H) 04/13/2021   Lab Results  Component Value Date   TRIG 119 04/13/2021   Lab Results  Component Value Date   CHOLHDL 4.7 04/13/2021   No results found for: "HGBA1C"    Assessment & Plan:   Problem List Items Addressed This Visit       Nervous and Auditory   MS (multiple sclerosis) (HCC) (Chronic)    He is established with neurology continues natalizumab 300 mg injection Was encouraged to maintain close follow-up with neurology      Relapsing remitting multiple sclerosis (HCC)    They would like baclofen to be discontinued because it makes the patient too sleepy We will continue gabapentin 40 mg 3 times  daily Encouraged to maintain close follow-up with neurology        Genitourinary   Acute pyelonephritis - Primary    Now resolved Patient denies any complaints today Due encouraged to maintain close follow-up with the urologist      Relevant Orders   CBC   Basic Metabolic Panel   Magnesium   Phosphorus     Other   Hospital discharge follow-up    Hospital chart reviewed, including discharge summary Medications reconciled and reviewed with the patient in detail        No orders of the defined types were placed in this  encounter.   Follow-up: Return in about 3 months (around 07/21/2023) for MS .    Donell Beers, FNP

## 2023-04-21 DIAGNOSIS — G35 Multiple sclerosis: Secondary | ICD-10-CM | POA: Diagnosis not present

## 2023-04-21 LAB — BASIC METABOLIC PANEL
BUN/Creatinine Ratio: 10 (ref 9–20)
BUN: 8 mg/dL (ref 6–24)
CO2: 25 mmol/L (ref 20–29)
Calcium: 9.3 mg/dL (ref 8.7–10.2)
Chloride: 104 mmol/L (ref 96–106)
Creatinine, Ser: 0.81 mg/dL (ref 0.76–1.27)
Glucose: 93 mg/dL (ref 70–99)
Potassium: 3.7 mmol/L (ref 3.5–5.2)
Sodium: 141 mmol/L (ref 134–144)
eGFR: 111 mL/min/{1.73_m2} (ref >59)

## 2023-04-21 LAB — CBC
Hematocrit: 38.8 % (ref 37.5–51.0)
Hemoglobin: 12.7 g/dL — ABNORMAL LOW (ref 13.0–17.7)
MCH: 30.8 pg (ref 26.6–33.0)
MCHC: 32.7 g/dL (ref 31.5–35.7)
MCV: 94 fL (ref 79–97)
NRBC: 1 % — ABNORMAL HIGH (ref 0–0)
Platelets: 311 10*3/uL (ref 150–450)
RBC: 4.13 x10E6/uL — ABNORMAL LOW (ref 4.14–5.80)
RDW: 14.6 % (ref 11.6–15.4)
WBC: 9.7 10*3/uL (ref 3.4–10.8)

## 2023-04-21 LAB — PHOSPHORUS: Phosphorus: 4.1 mg/dL (ref 2.8–4.1)

## 2023-04-21 LAB — MAGNESIUM: Magnesium: 1.9 mg/dL (ref 1.6–2.3)

## 2023-04-22 DIAGNOSIS — G35 Multiple sclerosis: Secondary | ICD-10-CM | POA: Diagnosis not present

## 2023-04-23 DIAGNOSIS — G35 Multiple sclerosis: Secondary | ICD-10-CM | POA: Diagnosis not present

## 2023-04-24 DIAGNOSIS — G35 Multiple sclerosis: Secondary | ICD-10-CM | POA: Diagnosis not present

## 2023-04-24 LAB — STRATIFY JCV AB (W/ INDEX) W/ RFLX
Index Value: 0.14
Stratify JCV (TM) Ab w/Reflex Inhibition: NEGATIVE

## 2023-04-26 DIAGNOSIS — M6282 Rhabdomyolysis: Secondary | ICD-10-CM | POA: Diagnosis not present

## 2023-04-26 DIAGNOSIS — G35 Multiple sclerosis: Secondary | ICD-10-CM | POA: Diagnosis not present

## 2023-04-26 DIAGNOSIS — M6281 Muscle weakness (generalized): Secondary | ICD-10-CM | POA: Diagnosis not present

## 2023-04-27 DIAGNOSIS — G35 Multiple sclerosis: Secondary | ICD-10-CM | POA: Diagnosis not present

## 2023-04-29 DIAGNOSIS — G35 Multiple sclerosis: Secondary | ICD-10-CM | POA: Diagnosis not present

## 2023-05-01 DIAGNOSIS — G35 Multiple sclerosis: Secondary | ICD-10-CM | POA: Diagnosis not present

## 2023-05-02 DIAGNOSIS — G35 Multiple sclerosis: Secondary | ICD-10-CM | POA: Diagnosis not present

## 2023-05-03 DIAGNOSIS — G35 Multiple sclerosis: Secondary | ICD-10-CM | POA: Diagnosis not present

## 2023-05-04 DIAGNOSIS — G35 Multiple sclerosis: Secondary | ICD-10-CM | POA: Diagnosis not present

## 2023-05-05 DIAGNOSIS — G35 Multiple sclerosis: Secondary | ICD-10-CM | POA: Diagnosis not present

## 2023-05-06 DIAGNOSIS — G35 Multiple sclerosis: Secondary | ICD-10-CM | POA: Diagnosis not present

## 2023-05-07 DIAGNOSIS — G35 Multiple sclerosis: Secondary | ICD-10-CM | POA: Diagnosis not present

## 2023-05-08 ENCOUNTER — Telehealth: Payer: Self-pay | Admitting: *Deleted

## 2023-05-08 DIAGNOSIS — G35 Multiple sclerosis: Secondary | ICD-10-CM | POA: Diagnosis not present

## 2023-05-08 NOTE — Telephone Encounter (Signed)
Faxed completed/signed [place] [id number] form/request for independent assessment for personal care services attestation of medical need back to [phone]. Received fax confirmation.

## 2023-05-09 DIAGNOSIS — G35 Multiple sclerosis: Secondary | ICD-10-CM | POA: Diagnosis not present

## 2023-05-10 DIAGNOSIS — G35 Multiple sclerosis: Secondary | ICD-10-CM | POA: Diagnosis not present

## 2023-05-12 DIAGNOSIS — G35 Multiple sclerosis: Secondary | ICD-10-CM | POA: Diagnosis not present

## 2023-05-13 DIAGNOSIS — G35 Multiple sclerosis: Secondary | ICD-10-CM | POA: Diagnosis not present

## 2023-05-14 DIAGNOSIS — R338 Other retention of urine: Secondary | ICD-10-CM | POA: Diagnosis not present

## 2023-05-15 DIAGNOSIS — G35 Multiple sclerosis: Secondary | ICD-10-CM | POA: Diagnosis not present

## 2023-05-18 DIAGNOSIS — G35 Multiple sclerosis: Secondary | ICD-10-CM | POA: Diagnosis not present

## 2023-05-20 DIAGNOSIS — G35 Multiple sclerosis: Secondary | ICD-10-CM | POA: Diagnosis not present

## 2023-05-21 ENCOUNTER — Telehealth: Payer: Self-pay | Admitting: Nurse Practitioner

## 2023-05-21 NOTE — Telephone Encounter (Signed)
Fax received from [facility] requesting a new 3051 form be filled out for continuous of at home care before the previous authorization expires 06/17/23.   I have completed my parts of the form and placed it in the 'provider forms' tray for signature from [facility]    05/21/23

## 2023-05-21 NOTE — Telephone Encounter (Signed)
Copied from CRM [id number]. Topic: General - Other >> May 21, 2023  9:15 AM Raven B wrote: Reason for CRM: Shatara with [employer] called to receive status update for completion of form she faxed over on 11/25. Call back # [phone]

## 2023-05-23 NOTE — Telephone Encounter (Signed)
Forms were given to provider awaiting on the completion. KH

## 2023-05-24 ENCOUNTER — Ambulatory Visit (INDEPENDENT_AMBULATORY_CARE_PROVIDER_SITE_OTHER): Payer: Medicaid Other

## 2023-05-24 VITALS — BP 97/60 | HR 65 | Temp 98.2°F | Resp 16 | Ht 71.0 in

## 2023-05-24 DIAGNOSIS — G35 Multiple sclerosis: Secondary | ICD-10-CM

## 2023-05-24 MED ORDER — LORATADINE 10 MG PO TABS
10.0000 mg | ORAL_TABLET | Freq: Once | ORAL | Status: AC
  Administered 2023-05-24: 10 mg via ORAL
  Filled 2023-05-24: qty 1

## 2023-05-24 MED ORDER — ACETAMINOPHEN 325 MG PO TABS
650.0000 mg | ORAL_TABLET | Freq: Once | ORAL | Status: AC
  Administered 2023-05-24: 650 mg via ORAL
  Filled 2023-05-24: qty 2

## 2023-05-24 MED ORDER — SODIUM CHLORIDE 0.9 % IV SOLN
300.0000 mg | Freq: Once | INTRAVENOUS | Status: AC
  Administered 2023-05-24: 300 mg via INTRAVENOUS
  Filled 2023-05-24: qty 15

## 2023-05-24 NOTE — Progress Notes (Signed)
Diagnosis: Multiple Sclerosis  Provider:  Chilton Greathouse MD  Procedure: IV Infusion  IV Type: Peripheral, IV Location: L Antecubital  Tysabri (Natalizumab), Dose: 300 mg  Infusion Start Time: 1158  Infusion Stop Time: 1304  Post Infusion IV Care: Patient declined observation and Peripheral IV Discontinued  Discharge: Condition: Good, Destination: Home . AVS Provided  Performed by:  Garnette Czech, RN

## 2023-05-26 DIAGNOSIS — M6281 Muscle weakness (generalized): Secondary | ICD-10-CM | POA: Diagnosis not present

## 2023-05-26 DIAGNOSIS — M6282 Rhabdomyolysis: Secondary | ICD-10-CM | POA: Diagnosis not present

## 2023-05-26 DIAGNOSIS — G35 Multiple sclerosis: Secondary | ICD-10-CM | POA: Diagnosis not present

## 2023-05-28 DIAGNOSIS — G35 Multiple sclerosis: Secondary | ICD-10-CM | POA: Diagnosis not present

## 2023-05-30 DIAGNOSIS — N311 Reflex neuropathic bladder, not elsewhere classified: Secondary | ICD-10-CM | POA: Diagnosis not present

## 2023-05-30 DIAGNOSIS — R338 Other retention of urine: Secondary | ICD-10-CM | POA: Diagnosis not present

## 2023-05-30 DIAGNOSIS — N310 Uninhibited neuropathic bladder, not elsewhere classified: Secondary | ICD-10-CM | POA: Diagnosis not present

## 2023-06-05 DIAGNOSIS — G35 Multiple sclerosis: Secondary | ICD-10-CM | POA: Diagnosis not present

## 2023-06-08 DIAGNOSIS — G35 Multiple sclerosis: Secondary | ICD-10-CM | POA: Diagnosis not present

## 2023-06-11 DIAGNOSIS — G35 Multiple sclerosis: Secondary | ICD-10-CM | POA: Diagnosis not present

## 2023-06-12 DIAGNOSIS — G35 Multiple sclerosis: Secondary | ICD-10-CM | POA: Diagnosis not present

## 2023-06-14 DIAGNOSIS — G35 Multiple sclerosis: Secondary | ICD-10-CM | POA: Diagnosis not present

## 2023-06-15 DIAGNOSIS — G35 Multiple sclerosis: Secondary | ICD-10-CM | POA: Diagnosis not present

## 2023-06-16 DIAGNOSIS — G35 Multiple sclerosis: Secondary | ICD-10-CM | POA: Diagnosis not present

## 2023-06-18 DIAGNOSIS — G35 Multiple sclerosis: Secondary | ICD-10-CM | POA: Diagnosis not present

## 2023-06-19 DIAGNOSIS — G35 Multiple sclerosis: Secondary | ICD-10-CM | POA: Diagnosis not present

## 2023-06-21 ENCOUNTER — Ambulatory Visit (INDEPENDENT_AMBULATORY_CARE_PROVIDER_SITE_OTHER): Payer: Medicaid Other

## 2023-06-21 VITALS — BP 103/54 | HR 78 | Temp 98.3°F | Resp 16 | Ht 71.0 in | Wt 178.9 lb

## 2023-06-21 DIAGNOSIS — G35 Multiple sclerosis: Secondary | ICD-10-CM

## 2023-06-21 DIAGNOSIS — R338 Other retention of urine: Secondary | ICD-10-CM | POA: Diagnosis not present

## 2023-06-21 MED ORDER — NATALIZUMAB 300 MG/15ML IV CONC
300.0000 mg | Freq: Once | INTRAVENOUS | Status: AC
  Administered 2023-06-21: 300 mg via INTRAVENOUS
  Filled 2023-06-21: qty 15

## 2023-06-21 MED ORDER — LORATADINE 10 MG PO TABS
10.0000 mg | ORAL_TABLET | Freq: Once | ORAL | Status: AC
Start: 2023-06-21 — End: 2023-06-21
  Administered 2023-06-21: 10 mg via ORAL
  Filled 2023-06-21: qty 1

## 2023-06-21 MED ORDER — SODIUM CHLORIDE 0.9 % IV SOLN: INTRAVENOUS | Status: DC

## 2023-06-21 MED ORDER — ACETAMINOPHEN 325 MG PO TABS
650.0000 mg | ORAL_TABLET | Freq: Once | ORAL | Status: AC
  Administered 2023-06-21: 650 mg via ORAL
  Filled 2023-06-21: qty 2

## 2023-06-21 NOTE — Progress Notes (Signed)
 Diagnosis: Multiple Sclerosis  Provider:  Lonna Coder MD  Procedure: IV Infusion  IV Type: Peripheral, IV Location: L Hand  Tysabri  (Natalizumab ), Dose: 300 mg  Infusion Start Time: 1134  Infusion Stop Time: 1234  Post Infusion IV Care: Patient declined observation and Peripheral IV Discontinued  Discharge: Condition: Good, Destination: Home . AVS Provided  Performed by:  Donny Childes, RN

## 2023-06-22 DIAGNOSIS — G35 Multiple sclerosis: Secondary | ICD-10-CM | POA: Diagnosis not present

## 2023-06-23 DIAGNOSIS — G35 Multiple sclerosis: Secondary | ICD-10-CM | POA: Diagnosis not present

## 2023-06-25 DIAGNOSIS — G35 Multiple sclerosis: Secondary | ICD-10-CM | POA: Diagnosis not present

## 2023-06-26 DIAGNOSIS — M6281 Muscle weakness (generalized): Secondary | ICD-10-CM | POA: Diagnosis not present

## 2023-06-26 DIAGNOSIS — G35 Multiple sclerosis: Secondary | ICD-10-CM | POA: Diagnosis not present

## 2023-06-26 DIAGNOSIS — M6282 Rhabdomyolysis: Secondary | ICD-10-CM | POA: Diagnosis not present

## 2023-06-27 DIAGNOSIS — G35 Multiple sclerosis: Secondary | ICD-10-CM | POA: Diagnosis not present

## 2023-06-28 DIAGNOSIS — G35 Multiple sclerosis: Secondary | ICD-10-CM | POA: Diagnosis not present

## 2023-06-29 DIAGNOSIS — G35 Multiple sclerosis: Secondary | ICD-10-CM | POA: Diagnosis not present

## 2023-07-02 DIAGNOSIS — G35 Multiple sclerosis: Secondary | ICD-10-CM | POA: Diagnosis not present

## 2023-07-03 DIAGNOSIS — G35 Multiple sclerosis: Secondary | ICD-10-CM | POA: Diagnosis not present

## 2023-07-04 DIAGNOSIS — G35 Multiple sclerosis: Secondary | ICD-10-CM | POA: Diagnosis not present

## 2023-07-05 DIAGNOSIS — G35 Multiple sclerosis: Secondary | ICD-10-CM | POA: Diagnosis not present

## 2023-07-06 DIAGNOSIS — G35 Multiple sclerosis: Secondary | ICD-10-CM | POA: Diagnosis not present

## 2023-07-08 ENCOUNTER — Encounter (HOSPITAL_COMMUNITY): Payer: Self-pay | Admitting: *Deleted

## 2023-07-08 ENCOUNTER — Emergency Department (HOSPITAL_COMMUNITY)
Admission: EM | Admit: 2023-07-08 | Discharge: 2023-07-08 | Disposition: A | Discharge status: Home / Self Care | Payer: Medicaid Other | Attending: Emergency Medicine | Admitting: Emergency Medicine

## 2023-07-08 ENCOUNTER — Other Ambulatory Visit: Payer: Self-pay

## 2023-07-08 DIAGNOSIS — R140 Abdominal distension (gaseous): Secondary | ICD-10-CM | POA: Insufficient documentation

## 2023-07-08 DIAGNOSIS — T83038A Leakage of other indwelling urethral catheter, initial encounter: Secondary | ICD-10-CM | POA: Diagnosis not present

## 2023-07-08 DIAGNOSIS — R1084 Generalized abdominal pain: Secondary | ICD-10-CM | POA: Insufficient documentation

## 2023-07-08 DIAGNOSIS — Y732 Prosthetic and other implants, materials and accessory gastroenterology and urology devices associated with adverse incidents: Secondary | ICD-10-CM | POA: Diagnosis not present

## 2023-07-08 DIAGNOSIS — R1030 Lower abdominal pain, unspecified: Secondary | ICD-10-CM | POA: Diagnosis not present

## 2023-07-08 MED ORDER — PHENAZOPYRIDINE HCL 200 MG PO TABS
200.0000 mg | ORAL_TABLET | Freq: Once | ORAL | Status: AC
  Administered 2023-07-08: 200 mg via ORAL
  Filled 2023-07-08: qty 1

## 2023-07-08 MED ORDER — ACETAMINOPHEN 500 MG PO TABS
1000.0000 mg | ORAL_TABLET | Freq: Once | ORAL | Status: AC
  Administered 2023-07-08: 1000 mg via ORAL
  Filled 2023-07-08: qty 2

## 2023-07-08 NOTE — Discharge Instructions (Signed)
Follow-up with your doctor as needed.

## 2023-07-08 NOTE — ED Triage Notes (Signed)
BIB family from home for suprapubic catheter clogged. States "needs to be flushed". Still voiding, but it is leaking. Last worked well 3d ago. Denies NVD or fever. Endorses dysuria.

## 2023-07-08 NOTE — ED Provider Notes (Signed)
  [place] EMERGENCY DEPARTMENT AT [facility] Provider Note   CSN: [id number] Arrival date & time: 07/08/23  1612     History {Add pertinent medical, surgical, social history, OB history to HPI:1} Chief Complaint  Patient presents with   suprapubic catheter clogged    Bradley Brandt is a 43 y.o. male.  Patient has a history of multiple sclerosis.  He has a suprapubic catheter that stopped draining.   Abdominal Pain      Home Medications Prior to Admission medications   Medication Sig Start Date End Date Taking? Authorizing Provider  DULoxetine (CYMBALTA) 30 MG capsule Take 1 capsule (30 mg total) by mouth daily. 01/17/23   Paseda, Grinnell Kay, FNP  gabapentin (NEURONTIN) 400 MG capsule Take 1 capsule (400 mg total) by mouth 3 (three) times daily. 01/19/23   Paseda, Fukuda Kay, FNP  natalizumab (TYSABRI) 300 MG/15ML injection Inject 15 mLs (300 mg total) into the vein every 28 (twenty-eight) days. 03/20/23   Sater, Pearletha Furl, MD  oxybutynin (DITROPAN-XL) 10 MG 24 hr tablet Take 1 tablet (10 mg total) by mouth daily. 01/17/23   Donell Beers, FNP      Allergies    Lithium    Review of Systems   Review of Systems  Gastrointestinal:  Positive for abdominal pain.    Physical Exam Updated Vital Signs BP 135/85   Pulse 91   Temp 98.6 F (37 C) (Oral)   Resp 18   Ht 5\' 11"  (1.803 m)   Wt 80.7 kg   SpO2 98%   BMI 24.83 kg/m  Physical Exam  ED Results / Procedures / Treatments   Labs (all labs ordered are listed, but only abnormal results are displayed) Labs Reviewed - No data to display  EKG None  Radiology No results found.  Procedures Procedures  {Document cardiac monitor, telemetry assessment procedure when appropriate:1}  Medications Ordered in ED Medications - No data to display  ED Course/ Medical Decision Making/ A&P   {   Click here for ABCD2, HEART and other calculatorsREFRESH Note before signing :1}                               Medical Decision Making Patient with a clogged suprapubic catheter.  It was replaced without problems.  A 20-gauge catheter was used  {Document critical care time when appropriate:1} {Document review of labs and clinical decision tools ie heart score, Chads2Vasc2 etc:1}  {Document your independent review of radiology images, and any outside records:1} {Document your discussion with family members, caretakers, and with consultants:1} {Document social determinants of health affecting pt's care:1} {Document your decision making why or why not admission, treatments were needed:1} Final Clinical Impression(s) / ED Diagnoses Final diagnoses:  Lower abdominal pain    Rx / DC Orders ED Discharge Orders     None

## 2023-07-09 DIAGNOSIS — G35 Multiple sclerosis: Secondary | ICD-10-CM | POA: Diagnosis not present

## 2023-07-10 ENCOUNTER — Telehealth: Payer: Self-pay

## 2023-07-10 DIAGNOSIS — G35 Multiple sclerosis: Secondary | ICD-10-CM | POA: Diagnosis not present

## 2023-07-10 NOTE — Transitions of Care (Post Inpatient/ED Visit) (Signed)
   07/10/2023  Name: Bradley Brandt MRN: [id number] DOB: 1981-05-22  Today's TOC FU Call Status:   Patient's Name and Date of Birth confirmed.  Transition Care Management Follow-up Telephone Call Date of Discharge: 07/08/23 Discharge Facility: [facility]) Type of Discharge: Emergency Department Reason for ED Visit: Other: How have you been since you were released from the hospital?: Better Any questions or concerns?: No  Items Reviewed: Did you receive and understand the discharge instructions provided?: Yes Medications obtained,verified, and reconciled?: Yes (Medications Reviewed) Any new allergies since your discharge?: No Dietary orders reviewed?: No Do you have support at home?: Yes People in Home: sibling(s) Name of Support/Comfort Primary Source: Marcelino Duster  Medications Reviewed Today: Medications Reviewed Today   Medications were not reviewed in this encounter     Home Care and Equipment/Supplies: Were Home Health Services Ordered?: Yes Name of Home Health Agency:: Patient is unsure Has Agency set up a time to come to your home?: No EMR reviewed for Home Health Orders: Orders present/patient has not received call (refer to CM for follow-up) Any new equipment or medical supplies ordered?: No  Functional Questionnaire: Do you need assistance with bathing/showering or dressing?: Yes Do you need assistance with meal preparation?: Yes Do you need assistance with eating?: No Do you have difficulty maintaining continence: No Do you need assistance with getting out of bed/getting out of a chair/moving?: Yes Do you have difficulty managing or taking your medications?: No  Follow up appointments reviewed: PCP Follow-up appointment confirmed?: Yes Date of PCP follow-up appointment?: 07/20/23 Follow-up Provider: [facility] Follow-up appointment confirmed?: NA Do you need transportation to your follow-up appointment?:  Yes    SIGNATURE Paseda, Nofziger Kay, FNP

## 2023-07-10 NOTE — Telephone Encounter (Signed)
See note

## 2023-07-11 ENCOUNTER — Encounter: Payer: Self-pay | Admitting: Neurology

## 2023-07-11 DIAGNOSIS — G35 Multiple sclerosis: Secondary | ICD-10-CM | POA: Diagnosis not present

## 2023-07-12 DIAGNOSIS — G35 Multiple sclerosis: Secondary | ICD-10-CM | POA: Diagnosis not present

## 2023-07-13 DIAGNOSIS — G35 Multiple sclerosis: Secondary | ICD-10-CM | POA: Diagnosis not present

## 2023-07-16 DIAGNOSIS — G35 Multiple sclerosis: Secondary | ICD-10-CM | POA: Diagnosis not present

## 2023-07-17 DIAGNOSIS — G35 Multiple sclerosis: Secondary | ICD-10-CM | POA: Diagnosis not present

## 2023-07-18 ENCOUNTER — Telehealth: Payer: Self-pay

## 2023-07-18 ENCOUNTER — Ambulatory Visit (INDEPENDENT_AMBULATORY_CARE_PROVIDER_SITE_OTHER): Payer: Medicaid Other | Admitting: Neurology

## 2023-07-18 ENCOUNTER — Encounter: Payer: Self-pay | Admitting: Neurology

## 2023-07-18 ENCOUNTER — Telehealth: Payer: Self-pay | Admitting: Neurology

## 2023-07-18 ENCOUNTER — Telehealth: Payer: Self-pay | Admitting: *Deleted

## 2023-07-18 VITALS — BP 115/73 | HR 98

## 2023-07-18 DIAGNOSIS — R270 Ataxia, unspecified: Secondary | ICD-10-CM | POA: Diagnosis not present

## 2023-07-18 DIAGNOSIS — Z79899 Other long term (current) drug therapy: Secondary | ICD-10-CM

## 2023-07-18 DIAGNOSIS — Z978 Presence of other specified devices: Secondary | ICD-10-CM | POA: Diagnosis not present

## 2023-07-18 DIAGNOSIS — N319 Neuromuscular dysfunction of bladder, unspecified: Secondary | ICD-10-CM | POA: Diagnosis not present

## 2023-07-18 DIAGNOSIS — E559 Vitamin D deficiency, unspecified: Secondary | ICD-10-CM | POA: Diagnosis not present

## 2023-07-18 DIAGNOSIS — G35 Multiple sclerosis: Secondary | ICD-10-CM | POA: Diagnosis not present

## 2023-07-18 DIAGNOSIS — R3989 Other symptoms and signs involving the genitourinary system: Secondary | ICD-10-CM | POA: Diagnosis not present

## 2023-07-18 DIAGNOSIS — R269 Unspecified abnormalities of gait and mobility: Secondary | ICD-10-CM

## 2023-07-18 MED ORDER — SULFAMETHOXAZOLE-TRIMETHOPRIM 400-80 MG PO TABS: 1.0000 | ORAL_TABLET | Freq: Two times a day (BID) | ORAL | 0 refills | Status: DC

## 2023-07-18 NOTE — Telephone Encounter (Signed)
Placed [facility] lab in [facility] lock box for routine lab pick up. Results pending.

## 2023-07-18 NOTE — Progress Notes (Signed)
[name]  PATIENT: Bradley Brandt DOB: [birth year]  REFERRING DOCTOR OR PCP:  Thomes Dinning SOURCE: patient, records form Dr. Gaynelle Adu, MRI reports and images on PACS  _________________________________   HISTORICAL  CHIEF COMPLAINT:  Chief Complaint  Patient presents with   Room 11    Pt is here with his Sister and her Friend. Pt states that when he urinate he burns. Pt is requesting to have PT. Pt is requesting a Urine test to test for possible UTI.     HISTORY OF PRESENT ILLNESS:  Bradley Brandt is a 43 y.o.  man with relapsing remitting multiple sclerosis.     Update 01/09/2023   MS: He is back on Tysabri and tolerating it well.   His last infusion was 01/01/2023.   He is stable with no exacerbations since restarting Tysabri.     In late 2023/early 2024, he had missed several infusions and presented to the ED 07/11/2022 with an apparent rebound and an MRI showing multiple enbancing lesions in the brain.   There were multiple old spinal cord lesions and an enhancing new focus at T7.   He received 5 days of IV Solumedrol and was discharged.    He got his Tysabri infusion 07/26/2022 (looks like he had previous infusions 04/21/2022 and 03/25/2022)   We discussed Ocrevus or Kesimpta (as might improve compliance). Repeat imaging 07/31/2022 showed a new lest pontomedullary junction and new right centrum semiovale lesion compared to 07/12/2022.   MRI of the cervical and thoracic spine was unchanged.     We discussed the importance of compliance as the benefit will wear off after 2-3 months if doses are skipped.     He can take a few steps with a walker (was ablt to do 80100 feet before the exacerbation 06/2022).   Left foot drop has worsened and he now has trouble raising the left leg.  Balance is very poor and he cannot stand without strong bilateral support and cannot use a walker.[name]    He denies arm weakness or spasticity but is noted to have weakness in the left arm when  tested   no numbness or tingling   Bladder function is tpoor and has an Foley cath.   He has had increase bladder spasms the last week and family notes change in urine.    Vision is doing the same.     He has stable fatigue.   He notes some depression, similar to earlier in the year.   He has more anxiety.   He is seeing psychiatry again.   He was diagnosed with bipolar disease   He is sleeping worse.[name]   He notes cognition is about the same.  Focus is sometimes poor.         MS history: Bradley Brandt was diagnosed around 2001 when he presented with right-sided numbness and clumsiness and weakness.   Initially, he was on Avonex and then Rebif but had relapses on both of them.  He could not tolerate Tecfidera.   He was started on Tysabri about 1 year ago and his last infusion was at [facility] at 11/08/2015.  He was seen Dr. Gaynelle Adu [facility] and those records were reviewed. He did receive a course of Solu-Medrol March 2015 and another course of Solu-Medrol January 2016 when he presented with left leg weakness and numbness that was new    An MRI of the brain 08/28/2013 personally reviewed shows old extensive white matter changes  in both hemispheres with multiple sclerosis. There were new areas of restricted diffusion without definite enhancement that could be consistent with ongoing demyelination when compared to the MRI from 05/13/2013.  He missed 2-3 doses of Tysabri December 2023 in January 2023 and had a rebound with multiple enhancing lesions towards the end of January 2024.  He was treated with high-dose steroids and Tysabri was resumed.   We had also discussed anti-CD20 agents if compliance is difficult.   Imaging: MRI of the brain 08/30/2019 showed multiple T2/FLAIR hyperintense foci in the hemispheres, pons, cerebellum and thalamus in a pattern and configuration consistent with chronic demyelinating plaque associated with multiple sclerosis.  None of the foci appear to be acute and  they do not enhance.  Compared to the MRI dated 01/14/2019, there is no interval progression.  MRI of the cervical spine 01/06/2018 showed T2 hyperintense foci within the spinal cord posteriorly adjacent to C2, to the right adjacent to C3, to the left adjacent to C4 and centrally and to the right adjacent to C4-C5 and C5.   None of these appear to be acute and they were all present on the 2015 MRI.[name]    MRI of the brain 01/06/2018 showed multiple T2/FLAIR hyperintense foci in the hemispheres and in the left middle cerebellar peduncle consistent with chronic demyelinating plaque associated with multiple sclerosis.  None of the foci appears to be acute.  None of them enhance.  However, when compared to the 2015 MRI focus in the cerebellum and a couple foci in the hemispheres were not clearly present on the earlier MRI.    MRI of the brain and spine 07/12/2022 showed MRI showing multiple enbancing lesions in the brain.   There were multiple old spinal cord lesions and an enhancing new focus at T7.  Repeat imaging 07/31/2022 showed a new lest pontomedullary junction and new right centrum semiovale lesion compared to 07/12/2022.   MRI of the cervical and thoracic spine was unchanged   REVIEW OF SYSTEMS: Constitutional: No fevers, chills, sweats, or change in appetite.   Notes fatigue Eyes: No visual changes, double vision, eye pain Ear, nose and throat: No hearing loss, ear pain, nasal congestion, sore throat Cardiovascular: No chest pain, palpitations Respiratory:  No shortness of breath at rest or with exertion.   No wheezes GastrointestinaI: No nausea, vomiting, diarrhea, abdominal pain, fecal incontinence Genitourinary:  He has hesitancy and frequency.   No UTI's. Musculoskeletal:  No neck pain, back pain Integumentary: No rash, pruritus, skin lesions Neurological: as above Psychiatric: No depression at this time.  No anxiety Endocrine: No palpitations, diaphoresis, change in appetite, change in weigh  or increased thirst Hematologic/Lymphatic:  No anemia, purpura, petechiae. Allergic/Immunologic: No itchy/runny eyes, nasal congestion, recent allergic reactions, rashes  ALLERGIES: Allergies  Allergen Reactions   Lithium Rash    HOME MEDICATIONS:  Current Outpatient Medications:    natalizumab (TYSABRI) 300 MG/15ML injection, Inject 15 mLs (300 mg total) into the vein every 28 (twenty-eight) days., Disp: 15 mL, Rfl: 11   oxybutynin (DITROPAN-XL) 10 MG 24 hr tablet, Take 1 tablet (10 mg total) by mouth daily., Disp: 90 tablet, Rfl: 1   DULoxetine (CYMBALTA) 30 MG capsule, Take 1 capsule (30 mg total) by mouth daily. (Patient not taking: Reported on 07/18/2023), Disp: 90 capsule, Rfl: 1   gabapentin (NEURONTIN) 400 MG capsule, Take 1 capsule (400 mg total) by mouth 3 (three) times daily. (Patient not taking: Reported on 07/18/2023), Disp: 270 capsule, Rfl: 1  PAST MEDICAL  HISTORY: Past Medical History:  Diagnosis Date   Anxiety    Bipolar 1 disorder (HCC)    Bipolar disorder, unspecified (HCC) 01/03/2013   MS (multiple sclerosis) (HCC) dx 2006   relapsing-remitting right sided weakness   Neuromuscular disorder (HCC)    MS   Pneumonia    Right spastic hemiplegia (HCC)    Substance abuse (HCC)    Suprapubic catheter (HCC)     PAST SURGICAL HISTORY: Past Surgical History:  Procedure Laterality Date   BOTOX INJECTION N/A 03/12/2023   Procedure: BOTOX INJECTION;  Surgeon: Crista Elliot, MD;  Location: [facility];  Service: Urology;  Laterality: N/A;   INSERTION OF SUPRAPUBIC CATHETER N/A 03/12/2023   Procedure: SUPRAPUBIC CATHETER EXCHANGE;  Surgeon: Crista Elliot, MD;  Location: [facility];  Service: Urology;  Laterality: N/A;   IR CATHETER TUBE CHANGE  10/31/2022   IR CATHETER TUBE CHANGE  11/02/2022   IR FLUORO GUIDED NEEDLE PLC ASPIRATION/INJECTION LOC  09/07/2022   TUMOR REMOVAL  1983   abdomen benign    FAMILY HISTORY: Family History  Adopted: Yes  Problem Relation Age  of Onset   Lupus Mother    Ataxia Neg Hx    Chorea Neg Hx    Dementia Neg Hx    Mental retardation Neg Hx    Migraines Neg Hx    Multiple sclerosis Neg Hx    Neurofibromatosis Neg Hx    Neuropathy Neg Hx    Parkinsonism Neg Hx    Seizures Neg Hx    Stroke Neg Hx     SOCIAL HISTORY:  Social History   Socioeconomic History   Marital status: Single    Spouse name: Not on file   Number of children: Not on file   Years of education: Not on file   Highest education level: Not on file  Occupational History   Not on file  Tobacco Use   Smoking status: Every Day    Current packs/day: 1.00    Types: Cigarettes    Passive exposure: Current   Smokeless tobacco: Never  Vaping Use   Vaping status: Former  Substance and Sexual Activity   Alcohol use: Yes    Alcohol/week: 32.0 standard drinks of alcohol    Types: 10 Glasses of wine, 12 Cans of beer, 10 Shots of liquor per week   Drug use: Yes    Frequency: 7.0 times per week    Types: Marijuana    Comment: daily   Sexual activity: Yes    Partners: Female    Birth control/protection: None  Other Topics Concern   Not on file  Social History Narrative   Lives home alone.   Social Drivers of Corporate investment banker Strain: Not on file  Food Insecurity: Patient Declined (04/03/2023)   Hunger Vital Sign    Worried About Running Out of Food in the Last Year: Patient declined    Ran Out of Food in the Last Year: Patient declined  Transportation Needs: No Transportation Needs (04/03/2023)   PRAPARE - Administrator, Civil Service (Medical): No    Lack of Transportation (Non-Medical): No  Physical Activity: Not on file  Stress: Not on file  Social Connections: Unknown (10/17/2021)   [facility]   Social Network    Social Network: Not on file  Intimate Partner Violence: Not At Risk (04/03/2023)   Humiliation, Afraid, Rape, and Kick questionnaire    Fear of Current or  Ex-Partner:  No    Emotionally Abused: No    Physically Abused: No    Sexually Abused: No     PHYSICAL EXAM  Vitals:   07/18/23 1420  BP: 115/73  Pulse: 98      There is no height or weight on file to calculate BMI.   General: The patient is well-developed and well-nourished and in no acute distress.  Has indwelling catheter   Neurologic Exam   Mental status: The patient is alert and oriented x 3 at the time of the examination. The patient has apparent normal recent and remote memory, with an apparently normal attention span and concentration ability.   Speech is normal.   Cranial nerves: Extraocular movements are full.   2+ right APD.   Reduced visual acuity and reduced color vision OD     Facial strength is normal. Trapezius strength is strong.  No obvious hearing deficits are noted.   Motor:  Muscle bulk is normal.   Slowed rapid altering movements in left  hand.  There is increased muscle tone in the left arm and both legs  Strength is 5/5 in the right arm, for a left triceps and 4+/5 in the left arm.  And 4-/5 in the right leg and 4/5 in the left leg.      Sensory: He reports symmetric sensationin arms bu reduced left leg vibration sensation.  TOuch is symmetric.      Coordination: Cerebellar testing reveals reduced finger-nose-finger and very poor left heel-to-shin   Gait and station: He is unable to stand without bilateral support.  He needs strong bilat support to stand and take a couple steps.  Romberg is positive   Reflexes: Deep tendon reflexes are increased with spread at the knees.  He has nonsustained clonus at the ankles       ____________________________________________  MS (multiple sclerosis) (HCC) - Plan: Stratify JCV Antibody Test ([facility]), CBC with Differential/Platelet  Relapsing remitting multiple sclerosis (HCC) - Plan: Ambulatory referral to Physical Therapy  High risk medication use  Vitamin D deficiency - Plan: VITAMIN D 25 Hydroxy (Vit-D Deficiency,  Fractures)  Gait disturbance - Plan: Ambulatory referral to Physical Therapy  Ataxia  Neurogenic bladder - Plan: Urinalysis, Culture, Urine  Indwelling Foley catheter present - Plan: Urinalysis, Culture, Urine  Bladder pain - Plan: Urinalysis, Culture, Urine   1.  He will continue Tysabri   check JCV today.  If converts to JCV Ab positive or if transpotation becomeds more of a problem, consider change to an anti-CD20 agent 2.   Continue gabapentin for dysesthesia and back pain. 3.   Stay active.   Exercise as tolerated 4.   Possible UTI.  Will check culture and do course of Bactrim.  Continue seeing urology for incontinence 5.   PT for gait disturbance (gait issues due to ataxia>weakness)  Return in 6 months or sooner if new or worsening neurologic symptoms.   40-minute office visit with the majority of the time spent face-to-face for history and physical, discussion/counseling and decision-making.  Additional time with record review and documentation.  This visit is part of a comprehensive longitudinal care medical relationship regarding the patients primary diagnosis of multiple sclerosis and related concerns.   Vila Dory A. Epimenio Foot, MD, PhD 07/18/2023, 2:51 PM Certified in Neurology, Clinical Neurophysiology, Sleep Medicine, Pain Medicine and Neuroimaging  [facility] [address] [place], [place] [address] [phone]

## 2023-07-18 NOTE — Telephone Encounter (Signed)
Referral for physical therapy sent through [facility] to [facility]. Phone: [phone]

## 2023-07-18 NOTE — Telephone Encounter (Signed)
Called pt's sister to see if pt was coming to appointment at 1:33pm on 07/18/2023

## 2023-07-19 ENCOUNTER — Ambulatory Visit: Payer: Medicaid Other

## 2023-07-19 DIAGNOSIS — G35 Multiple sclerosis: Secondary | ICD-10-CM | POA: Diagnosis not present

## 2023-07-19 LAB — CBC WITH DIFFERENTIAL/PLATELET
Basophils Absolute: 0 10*3/uL (ref 0.0–0.2)
Basos: 0 %
EOS (ABSOLUTE): 0 10*3/uL (ref 0.0–0.4)
Eos: 0 %
Hematocrit: 40 % (ref 37.5–51.0)
Hemoglobin: 13.3 g/dL (ref 13.0–17.7)
Immature Grans (Abs): 0.1 10*3/uL (ref 0.0–0.1)
Immature Granulocytes: 1 %
Lymphocytes Absolute: 2.7 10*3/uL (ref 0.7–3.1)
Lymphs: 21 %
MCH: 30 pg (ref 26.6–33.0)
MCHC: 33.3 g/dL (ref 31.5–35.7)
MCV: 90 fL (ref 79–97)
Monocytes Absolute: 1.3 10*3/uL — ABNORMAL HIGH (ref 0.1–0.9)
Monocytes: 10 %
Neutrophils Absolute: 8.9 10*3/uL — ABNORMAL HIGH (ref 1.4–7.0)
Neutrophils: 68 %
Platelets: 208 10*3/uL (ref 150–450)
RBC: 4.43 x10E6/uL (ref 4.14–5.80)
RDW: 14.3 % (ref 11.6–15.4)
WBC: 13.1 10*3/uL — ABNORMAL HIGH (ref 3.4–10.8)

## 2023-07-19 LAB — URINALYSIS
Bilirubin, UA: NEGATIVE
Glucose, UA: NEGATIVE
Ketones, UA: NEGATIVE
Nitrite, UA: POSITIVE — AB
Specific Gravity, UA: 1.016 (ref 1.005–1.030)
Urobilinogen, Ur: 1 mg/dL (ref 0.2–1.0)
pH, UA: 6.5 (ref 5.0–7.5)

## 2023-07-19 LAB — VITAMIN D 25 HYDROXY (VIT D DEFICIENCY, FRACTURES): Vit D, 25-Hydroxy: 23 ng/mL — ABNORMAL LOW (ref 30.0–100.0)

## 2023-07-20 ENCOUNTER — Ambulatory Visit (INDEPENDENT_AMBULATORY_CARE_PROVIDER_SITE_OTHER): Payer: Medicaid Other | Admitting: Nurse Practitioner

## 2023-07-20 ENCOUNTER — Encounter: Payer: Self-pay | Admitting: Nurse Practitioner

## 2023-07-20 VITALS — BP 121/56 | HR 85 | Temp 97.0°F

## 2023-07-20 DIAGNOSIS — Z9359 Other cystostomy status: Secondary | ICD-10-CM

## 2023-07-20 DIAGNOSIS — Z1211 Encounter for screening for malignant neoplasm of colon: Secondary | ICD-10-CM | POA: Diagnosis not present

## 2023-07-20 DIAGNOSIS — F17209 Nicotine dependence, unspecified, with unspecified nicotine-induced disorders: Secondary | ICD-10-CM | POA: Diagnosis not present

## 2023-07-20 DIAGNOSIS — R32 Unspecified urinary incontinence: Secondary | ICD-10-CM | POA: Diagnosis not present

## 2023-07-20 DIAGNOSIS — N3001 Acute cystitis with hematuria: Secondary | ICD-10-CM | POA: Diagnosis not present

## 2023-07-20 DIAGNOSIS — G35 Multiple sclerosis: Secondary | ICD-10-CM

## 2023-07-20 DIAGNOSIS — Z23 Encounter for immunization: Secondary | ICD-10-CM | POA: Diagnosis not present

## 2023-07-20 NOTE — Assessment & Plan Note (Signed)
Currently on Bactrim 400-80 mg 1 tablet twice daily for 7 days Patient encouraged to continue full course of antibiotics ordered Encouraged to drink at least 64 ounces of water daily to maintain hydration

## 2023-07-20 NOTE — Progress Notes (Addendum)
 Established Patient Office Visit  Subjective:  Patient ID: Bradley Brandt, male    DOB: Oct 27, 1980  Age: 43 y.o. MRN: [id number]  CC:  Chief Complaint  Patient presents with   Medical Management of Chronic Issues    MS    HPI Bradley Brandt is a 43 y.o. male  has a past medical history of Anxiety, Bipolar 1 disorder (HCC), Bipolar disorder, unspecified (HCC) (01/03/2013), MS (multiple sclerosis) (HCC) (dx 2006), Neuromuscular disorder (HCC), Pneumonia, Right spastic hemiplegia (HCC), Substance abuse (HCC), and Suprapubic catheter (HCC).  Patient presents for follow-up for his chronic medical conditions Patient denies any adverse reactions to current medications States that he is doing well generally He is accompanied by his sister  Urinary tract infection .has an indwelling Foley catheter ,[facility].  He is currently on Bactrim for UTI ordered by neurology.  He denies fever, chills, malaise  Tobacco use disorder.  Smokes 1 pack of cigarettes daily.  Patient denies cough, shortness of breath, wheezing  Flu vaccine given in the office today, patient encouraged to get pneumonia vaccine at the pharmacy     Past Medical History:  Diagnosis Date   Anxiety    Bipolar 1 disorder (HCC)    Bipolar disorder, unspecified (HCC) 01/03/2013   MS (multiple sclerosis) (HCC) dx 2006   relapsing-remitting right sided weakness   Neuromuscular disorder (HCC)    MS   Pneumonia    Right spastic hemiplegia (HCC)    Substance abuse (HCC)    Suprapubic catheter (HCC)     Past Surgical History:  Procedure Laterality Date   BOTOX INJECTION N/A 03/12/2023   Procedure: BOTOX INJECTION;  Surgeon: Crista Elliot, MD;  Location: [facility];  Service: Urology;  Laterality: N/A;   INSERTION OF SUPRAPUBIC CATHETER N/A 03/12/2023   Procedure: SUPRAPUBIC CATHETER EXCHANGE;  Surgeon: Crista Elliot, MD;  Location: [facility];  Service: Urology;   Laterality: N/A;   IR CATHETER TUBE CHANGE  10/31/2022   IR CATHETER TUBE CHANGE  11/02/2022   IR FLUORO GUIDED NEEDLE PLC ASPIRATION/INJECTION LOC  09/07/2022   TUMOR REMOVAL  1983   abdomen benign    Family History  Adopted: Yes  Problem Relation Age of Onset   Lupus Mother    Ataxia Neg Hx    Chorea Neg Hx    Dementia Neg Hx    Mental retardation Neg Hx    Migraines Neg Hx    Multiple sclerosis Neg Hx    Neurofibromatosis Neg Hx    Neuropathy Neg Hx    Parkinsonism Neg Hx    Seizures Neg Hx    Stroke Neg Hx     Social History   Socioeconomic History   Marital status: Single    Spouse name: Not on file   Number of children: Not on file   Years of education: Not on file   Highest education level: Not on file  Occupational History   Not on file  Tobacco Use   Smoking status: Every Day    Current packs/day: 1.00    Types: Cigarettes    Passive exposure: Current   Smokeless tobacco: Never  Vaping Use   Vaping status: Former  Substance and Sexual Activity   Alcohol use: Yes    Alcohol/week: 32.0 standard drinks of alcohol    Types: 10 Glasses of wine, 12 Cans of beer, 10 Shots of liquor per week   Drug  use: Yes    Frequency: 7.0 times per week    Types: Marijuana    Comment: daily   Sexual activity: Yes    Partners: Female    Birth control/protection: None  Other Topics Concern   Not on file  Social History Narrative   Lives home alone.   Social Drivers of Corporate investment banker Strain: Not on file  Food Insecurity: Patient Declined (04/03/2023)   Hunger Vital Sign    Worried About Running Out of Food in the Last Year: Patient declined    Ran Out of Food in the Last Year: Patient declined  Transportation Needs: No Transportation Needs (04/03/2023)   PRAPARE - Administrator, Civil Service (Medical): No    Lack of Transportation (Non-Medical): No  Physical Activity: Not on file  Stress: Not on file  Social Connections: Unknown  (10/17/2021)   Received from [facility]   Social Network    Social Network: Not on file  Intimate Partner Violence: Not At Risk (04/03/2023)   Humiliation, Afraid, Rape, and Kick questionnaire    Fear of Current or Ex-Partner: No    Emotionally Abused: No    Physically Abused: No    Sexually Abused: No    Outpatient Medications Prior to Visit  Medication Sig Dispense Refill   natalizumab (TYSABRI) 300 MG/15ML injection Inject 15 mLs (300 mg total) into the vein every 28 (twenty-eight) days. 15 mL 11   oxybutynin (DITROPAN-XL) 10 MG 24 hr tablet Take 1 tablet (10 mg total) by mouth daily. 90 tablet 1   sulfamethoxazole-trimethoprim (BACTRIM) 400-80 MG tablet Take 1 tablet by mouth 2 (two) times daily. 14 tablet 0   DULoxetine (CYMBALTA) 30 MG capsule Take 1 capsule (30 mg total) by mouth daily. (Patient not taking: Reported on 07/18/2023) 90 capsule 1   gabapentin (NEURONTIN) 400 MG capsule Take 1 capsule (400 mg total) by mouth 3 (three) times daily. (Patient not taking: Reported on 07/18/2023) 270 capsule 1   No facility-administered medications prior to visit.    Allergies  Allergen Reactions   Lithium Rash    ROS Review of Systems  Constitutional:  Negative for appetite change, chills, fatigue and fever.  HENT:  Negative for congestion, postnasal drip, rhinorrhea and sneezing.   Respiratory:  Negative for cough, shortness of breath and wheezing.   Cardiovascular:  Negative for chest pain, palpitations and leg swelling.  Gastrointestinal:  Negative for abdominal pain, constipation, nausea and vomiting.  Genitourinary:  Negative for difficulty urinating, dysuria, flank pain and frequency.  Musculoskeletal:  Negative for arthralgias, back pain, joint swelling and myalgias.  Skin:  Negative for color change, pallor, rash and wound.  Neurological:  Negative for dizziness, facial asymmetry, numbness and headaches.  Psychiatric/Behavioral:  Negative for behavioral  problems, confusion, self-injury and suicidal ideas.       Objective:    Physical Exam Vitals and nursing note reviewed.  Constitutional:      General: He is not in acute distress.    Appearance: Normal appearance. He is not ill-appearing, toxic-appearing or diaphoretic.  HENT:     Mouth/Throat:     Mouth: Mucous membranes are moist.     Pharynx: Oropharynx is clear. No oropharyngeal exudate or posterior oropharyngeal erythema.  Eyes:     General: No scleral icterus.       Right eye: No discharge.        Left eye: No discharge.     Extraocular Movements: Extraocular movements  intact.     Conjunctiva/sclera: Conjunctivae normal.  Cardiovascular:     Rate and Rhythm: Normal rate and regular rhythm.     Pulses: Normal pulses.     Heart sounds: Normal heart sounds. No murmur heard.    No friction rub. No gallop.  Pulmonary:     Effort: Pulmonary effort is normal. No respiratory distress.     Breath sounds: Normal breath sounds. No stridor. No wheezing, rhonchi or rales.  Chest:     Chest wall: No tenderness.  Abdominal:     General: There is no distension.     Palpations: Abdomen is soft.     Tenderness: There is no abdominal tenderness. There is no right CVA tenderness, left CVA tenderness or guarding.  Genitourinary:    Comments: Foley catheter in place Musculoskeletal:        General: No swelling, tenderness or signs of injury.     Right lower leg: No edema.     Left lower leg: No edema.  Skin:    General: Skin is warm and dry.     Coloration: Skin is not jaundiced or pale.     Findings: No bruising, erythema or lesion.  Neurological:     Mental Status: He is alert and oriented to person, place, and time.  Psychiatric:        Mood and Affect: Mood normal.        Behavior: Behavior normal.        Thought Content: Thought content normal.        Judgment: Judgment normal.     BP (!) 121/56   Pulse 85   Temp (!) 97 F (36.1 C)   SpO2 96%  Wt Readings from Last 3  Encounters:  08/20/23 180 lb (81.6 kg)  07/31/23 177 lb 14.6 oz (80.7 kg)  07/23/23 178 lb (80.7 kg)    Lab Results  Component Value Date   TSH 1.197 07/30/2022   Lab Results  Component Value Date   WBC 13.1 (H) 07/18/2023   HGB 13.3 07/18/2023   HCT 40.0 07/18/2023   MCV 90 07/18/2023   PLT 208 07/18/2023   Lab Results  Component Value Date   NA 141 04/20/2023   K 3.7 04/20/2023   CO2 25 04/20/2023   GLUCOSE 93 04/20/2023   BUN 8 04/20/2023   CREATININE 0.81 04/20/2023   BILITOT 0.5 04/06/2023   ALKPHOS 63 04/06/2023   AST 31 04/06/2023   ALT 36 04/06/2023   PROT 5.9 (L) 04/06/2023   ALBUMIN 1.8 (L) 04/06/2023   CALCIUM 9.3 04/20/2023   ANIONGAP 9 04/10/2023   EGFR 111 04/20/2023   Lab Results  Component Value Date   CHOL 173 04/13/2021   Lab Results  Component Value Date   HDL 37 (L) 04/13/2021   Lab Results  Component Value Date   LDLCALC 114 (H) 04/13/2021   Lab Results  Component Value Date   TRIG 119 04/13/2021   Lab Results  Component Value Date   CHOLHDL 4.7 04/13/2021   No results found for: "HGBA1C"    Assessment & Plan:   Problem List Items Addressed This Visit       Nervous and Auditory   Relapsing remitting multiple sclerosis (HCC)   Continue natalizumab injection every 28 days Encouraged to maintain close follow-up with neurology        Genitourinary   UTI (urinary tract infection) - Primary   Currently on Bactrim 400-80 mg 1 tablet twice daily for  7 days Patient encouraged to continue full course of antibiotics ordered Encouraged to drink at least 64 ounces of water daily to maintain hydration        Other   Tobacco use disorder, continuous   Smokes about 1 pack/day  Asked about quitting: confirms that he/she currently smokes cigarettes Advise to quit smoking: Educated about QUITTING to reduce the risk of cancer, cardio and cerebrovascular disease. Assess willingness: Unwilling to quit at this time, not working on  cutting back. Assist with counseling and pharmacotherapy: Counseled for 5 minutes and literature provided. Arrange for follow up: follow up in 4 months and continue to offer help.       Suprapubic catheter (HCC)   [facility] and maintenance      Urinary incontinence   Uses incontinence pads and Pads at home  Will refill supply when due  Patient encouraged to maintain  close follow up with urology      Need for influenza vaccination   Patient educated on CDC recommendation for the vaccine. Verbal consent was obtained from the patient, vaccine administered by nurse, no sign of adverse reactions noted at this time. Patient education on arm soreness and use of tylenol or  for this patient  was discussed. Patient educated on the signs and symptoms of adverse effect and advise to contact the office if they occur.       Relevant Orders   Flu vaccine trivalent PF, 6mos and older(Flulaval,Afluria,Fluarix,Fluzone) (Completed)   Other Visit Diagnoses       Screening for colon cancer       Relevant Orders   Cologuard (Completed)       No orders of the defined types were placed in this encounter.   Follow-up: Return in about 4 months (around 11/17/2023) for MS.    Donell Beers, FNP

## 2023-07-20 NOTE — Assessment & Plan Note (Signed)
Smokes about 1 pack/day  Asked about quitting: confirms that he/she currently smokes cigarettes Advise to quit smoking: Educated about QUITTING to reduce the risk of cancer, cardio and cerebrovascular disease. Assess willingness: Unwilling to quit at this time, not working on cutting back. Assist with counseling and pharmacotherapy: Counseled for 5 minutes and literature provided. Arrange for follow up: follow up in 4 months and continue to offer help.

## 2023-07-20 NOTE — Assessment & Plan Note (Signed)
Patient educated on CDC recommendation for the vaccine. Verbal consent was obtained from the patient, vaccine administered by nurse, no sign of adverse reactions noted at this time. Patient education on arm soreness and use of tylenol orfor this patient  was discussed. Patient educated on the signs and symptoms of adverse effect and advise to contact the office if they occur 

## 2023-07-20 NOTE — Assessment & Plan Note (Signed)
Continue natalizumab injection every 28 days Encouraged to maintain close follow-up with neurology

## 2023-07-20 NOTE — Patient Instructions (Addendum)
Please get your Tdap vaccine and pneumococcal vaccine ( PCV 20) at the pharmacy  1. Screening for colon cancer (Primary)  - Cologuard  2. Need for influenza vaccination   3. Acute cystitis without hematuria   4. Tobacco use disorder, continuous   5. Relapsing remitting multiple sclerosis (HCC)   6. Suprapubic catheter [facility])      It is important that you exercise regularly at least 30 minutes 5 times a week as tolerated  Think about what you will eat, plan ahead. Choose " clean, green, fresh or frozen" over canned, processed or packaged foods which are more sugary, salty and fatty. 70 to 75% of food eaten should be vegetables and fruit. Three meals at set times with snacks allowed between meals, but they must be fruit or vegetables. Aim to eat over a 12 hour period , example 7 am to 7 pm, and STOP after  your last meal of the day. Drink water,generally about 64 ounces per day, no other drink is as healthy. Fruit juice is best enjoyed in a healthy way, by EATING the fruit.  Thanks for choosing [facility] we consider it a privelige to serve you.

## 2023-07-20 NOTE — Assessment & Plan Note (Signed)
Goes to [facility] for Foley catheter change and maintenance

## 2023-07-21 LAB — URINE CULTURE

## 2023-07-22 ENCOUNTER — Other Ambulatory Visit: Payer: Self-pay | Admitting: Neurology

## 2023-07-22 ENCOUNTER — Encounter: Payer: Self-pay | Admitting: Neurology

## 2023-07-22 MED ORDER — VITAMIN D (ERGOCALCIFEROL) 1.25 MG (50000 UNIT) PO CAPS: 50000.0000 [IU] | ORAL_CAPSULE | ORAL | 1 refills | Status: DC

## 2023-07-23 ENCOUNTER — Ambulatory Visit (INDEPENDENT_AMBULATORY_CARE_PROVIDER_SITE_OTHER): Payer: Medicaid Other

## 2023-07-23 VITALS — BP 109/63 | HR 58 | Temp 98.3°F | Resp 16 | Ht 71.0 in | Wt 178.0 lb

## 2023-07-23 DIAGNOSIS — G35 Multiple sclerosis: Secondary | ICD-10-CM

## 2023-07-23 MED ORDER — SODIUM CHLORIDE 0.9 % IV SOLN
300.0000 mg | Freq: Once | INTRAVENOUS | Status: AC
  Administered 2023-07-23: 300 mg via INTRAVENOUS
  Filled 2023-07-23: qty 15

## 2023-07-23 MED ORDER — SODIUM CHLORIDE 0.9 % IV SOLN
INTRAVENOUS | Status: DC
Start: 2023-07-23 — End: 2023-07-23

## 2023-07-23 MED ORDER — ACETAMINOPHEN 325 MG PO TABS
650.0000 mg | ORAL_TABLET | Freq: Once | ORAL | Status: AC
  Administered 2023-07-23: 650 mg via ORAL
  Filled 2023-07-23: qty 2

## 2023-07-23 MED ORDER — LORATADINE 10 MG PO TABS
10.0000 mg | ORAL_TABLET | Freq: Once | ORAL | Status: AC
  Administered 2023-07-23: 10 mg via ORAL
  Filled 2023-07-23: qty 1

## 2023-07-23 NOTE — Telephone Encounter (Signed)
JCV Results were Negative 0.17

## 2023-07-23 NOTE — Progress Notes (Signed)
Diagnosis: Multiple Sclerosis  Provider:  Chilton Greathouse MD  Procedure: IV Infusion  IV Type: Peripheral, IV Location: L Forearm  Tysabri (Natalizumab), Dose: 300 mg  Infusion Start Time: 1158  Infusion Stop Time: 1302  Post Infusion IV Care: Patient declined observation and Peripheral IV Discontinued  Discharge: Condition: Good, Destination: Home . AVS Provided  Performed by:  Garnette Czech, RN

## 2023-07-24 DIAGNOSIS — G35 Multiple sclerosis: Secondary | ICD-10-CM | POA: Diagnosis not present

## 2023-07-25 ENCOUNTER — Encounter: Payer: Self-pay | Admitting: Physical Therapy

## 2023-07-25 ENCOUNTER — Telehealth: Payer: Self-pay | Admitting: Physical Therapy

## 2023-07-25 ENCOUNTER — Other Ambulatory Visit: Payer: Self-pay

## 2023-07-25 ENCOUNTER — Ambulatory Visit: Payer: Medicaid Other | Attending: Neurology | Admitting: Physical Therapy

## 2023-07-25 DIAGNOSIS — R278 Other lack of coordination: Secondary | ICD-10-CM | POA: Diagnosis present

## 2023-07-25 DIAGNOSIS — M62838 Other muscle spasm: Secondary | ICD-10-CM | POA: Diagnosis present

## 2023-07-25 DIAGNOSIS — R2681 Unsteadiness on feet: Secondary | ICD-10-CM | POA: Diagnosis present

## 2023-07-25 DIAGNOSIS — G35 Multiple sclerosis: Secondary | ICD-10-CM | POA: Insufficient documentation

## 2023-07-25 DIAGNOSIS — R208 Other disturbances of skin sensation: Secondary | ICD-10-CM | POA: Diagnosis present

## 2023-07-25 DIAGNOSIS — R41840 Attention and concentration deficit: Secondary | ICD-10-CM | POA: Diagnosis present

## 2023-07-25 DIAGNOSIS — M6281 Muscle weakness (generalized): Secondary | ICD-10-CM | POA: Diagnosis present

## 2023-07-25 DIAGNOSIS — R296 Repeated falls: Secondary | ICD-10-CM | POA: Insufficient documentation

## 2023-07-25 DIAGNOSIS — R269 Unspecified abnormalities of gait and mobility: Secondary | ICD-10-CM | POA: Diagnosis not present

## 2023-07-25 DIAGNOSIS — R2689 Other abnormalities of gait and mobility: Secondary | ICD-10-CM | POA: Diagnosis present

## 2023-07-25 NOTE — Therapy (Signed)
 OUTPATIENT PHYSICAL THERAPY NEURO EVALUATION   Patient Name: Bradley Brandt MRN: [id number] DOB:[birth year], 43 y.o., male Today's Date: 07/25/2023   PCP: Paseda, Folashade R, FNP REFERRING PROVIDER: Vear Charlie LABOR, MD  END OF SESSION:  PT End of Session - 07/25/23 1030     Visit Number 1    Number of Visits 9   8 + eval   Date for PT Re-Evaluation 09/28/23    Authorization Type [place] MEDICAID [employer]    PT Start Time 1020   PT with pt prior   PT Stop Time 1105    PT Time Calculation (min) 45 min    Equipment Utilized During Treatment Gait belt    Behavior During Therapy Flat affect             Past Medical History:  Diagnosis Date   Anxiety    Bipolar 1 disorder (HCC)    Bipolar disorder, unspecified (HCC) 01/03/2013   MS (multiple sclerosis) (HCC) dx 2006   relapsing-remitting right sided weakness   Neuromuscular disorder (HCC)    MS   Pneumonia    Right spastic hemiplegia (HCC)    Substance abuse (HCC)    Suprapubic catheter [facility])    Past Surgical History:  Procedure Laterality Date   BOTOX  INJECTION N/A 03/12/2023   Procedure: BOTOX  INJECTION;  Surgeon: Carolee Sherwood Bradley DOUGLAS, MD;  Location: [facility];  Service: Urology;  Laterality: N/A;   INSERTION OF SUPRAPUBIC CATHETER N/A 03/12/2023   Procedure: SUPRAPUBIC CATHETER EXCHANGE;  Surgeon: Carolee Sherwood Bradley DOUGLAS, MD;  Location: [facility];  Service: Urology;  Laterality: N/A;   IR CATHETER TUBE CHANGE  10/31/2022   IR CATHETER TUBE CHANGE  11/02/2022   IR FLUORO GUIDED NEEDLE PLC ASPIRATION/INJECTION LOC  09/07/2022   TUMOR REMOVAL  1983   abdomen benign   Patient Active Problem List   Diagnosis Date Noted   Need for influenza vaccination 07/20/2023   Hospital discharge follow-up 04/20/2023   Malnutrition of moderate degree 04/11/2023   Acute pyelonephritis 04/03/2023   AKI (acute kidney injury) (HCC) 04/03/2023   Suspected UTI 02/06/2023   Urinary incontinence 02/06/2023   Suprapubic catheter (HCC)  01/31/2023   Marijuana user 01/17/2023   Tobacco use disorder, continuous 01/17/2023   Impaired mobility and ADLs 01/17/2023   UTI (urinary tract infection) 10/30/2022   Bipolar 1 disorder (HCC) 10/30/2022   Acute urinary retention 08/01/2022   Fever 08/01/2022   Rhabdomyolysis 07/30/2022   Multiple sclerosis exacerbation (HCC) 07/12/2022   Marijuana use 11/22/2020   History of marijuana use 11/22/2020   Chronic pain syndrome 11/21/2020   Encounter for examination following treatment at hospital 11/21/2020   Disorder of skeletal system 11/21/2020   Problems influencing health status 11/21/2020   MS (multiple sclerosis) (HCC)    Neuromuscular disorder (HCC)    Bipolar disorder in full remission (HCC) 11/16/2020   Other insomnia 08/11/2020   Generalized anxiety disorder 08/11/2020   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HCC) 12/31/2017   Optic neuritis 08/02/2017   High risk medication use 08/02/2017   Vitamin D  deficiency 09/19/2016   Hypercholesterolemia 09/19/2016   Numbness 12/16/2015   Spastic gait 12/16/2015   Urinary hesitancy 12/16/2015   Low back pain with sciatica 09/28/2014   Polysubstance dependence (HCC) 02/09/2014   Combined drug dependence excluding opioids (HCC) 02/09/2014   Relapsing remitting multiple sclerosis (HCC) 05/28/2013   Elevated BP 01/06/2013   Bipolar disorder, unspecified (HCC) 01/03/2013   Bipolar disorder (HCC) 01/03/2013   Right hemiparesis (  HCC) 01/02/2013   Multiple sclerosis flare 01/02/2013   Hemiplegia (HCC) 01/02/2013    ONSET DATE: 2001 (initial diagnosis)  REFERRING DIAG: G35 (ICD-10-CM) - Relapsing remitting multiple sclerosis (HCC) R26.9 (ICD-10-CM) - Gait disturbance  THERAPY DIAG:  Repeated falls - Plan: PT plan of care cert/re-cert  Other abnormalities of gait and mobility - Plan: PT plan of care cert/re-cert  Other lack of coordination - Plan: PT plan of care cert/re-cert  Other muscle spasm - Plan: PT plan of care  cert/re-cert  Muscle weakness (generalized) - Plan: PT plan of care cert/re-cert  Unsteadiness on feet - Plan: PT plan of care cert/re-cert  Rationale for Evaluation and Treatment: Rehabilitation  SUBJECTIVE:                                                                                                                                                                                             SUBJECTIVE STATEMENT: Pt states the primary reason he wanted to come to PT now (he has never done rehab before) was so he could walk.  He falls a lot.   Pt accompanied by: family member - Dad [name]  PERTINENT HISTORY: BPD 1, RRMS since 2001, polysubstance dependence, low back pain with sciatica, GAD, suprapubic catheter w/ recurrent UTI  From Neurologist notes: diagnosed around 2001 when he presented with right-sided numbness and clumsiness and weakness  In late 2023/early 2024, he had missed several infusions and presented to the ED 07/11/2022 with an apparent rebound and an MRI showing multiple enbancing lesions in the brain.   There were multiple old spinal cord lesions and an enhancing new focus at T7.   He received 5 days of IV Solumedrol and was discharged.    He got his Tysabri  infusion 07/26/2022 (looks like he had previous infusions 04/21/2022 and 03/25/2022)   We discussed Ocrevus or Kesimpta (as might improve compliance). Repeat imaging 07/31/2022 showed a new lest pontomedullary junction and new right centrum semiovale lesion compared to 07/12/2022.   MRI of the cervical and thoracic spine was unchanged.     We discussed the importance of compliance as the benefit will wear off after 2-3 months if doses are skipped.      He can take a few steps with a walker (was ablt to do 80100 feet before the exacerbation 06/2022).   Left foot drop has worsened and he now has trouble raising the left leg.  Balance is very poor and he cannot stand without strong bilateral support and cannot use a walker.[name]    He  denies arm weakness or spasticity but is noted to have weakness in the  left arm when tested   no numbness or tingling   Bladder function is tpoor and has an Foley cath.   He has had increase bladder spasms the last week and family notes change in urine.  PAIN:  Are you having pain? No  PRECAUTIONS: Fall  RED FLAGS: Bowel or bladder incontinence: Yes: pt reports saddle tingling for 3 weeks and he has not spoken to Dr. Vear about this    WEIGHT BEARING RESTRICTIONS: No  FALLS: Has patient fallen in last 6 months? Yes. Number of falls several - unsure how many, usually when walking  LIVING ENVIRONMENT: Lives with: lives alone Lives in: House/apartment Stairs: No Has following equipment at home: Vannie - 2 wheeled, Wheelchair (manual), shower chair, and bed side commode  PLOF: Requires assistive device for independence, Needs assistance with ADLs, Needs assistance with homemaking, Needs assistance with gait, Needs assistance with transfers, and supposed to have 80 hrs of personal care, but has not received this yet, dad says he will follow-up  PATIENT GOALS: to walk  OBJECTIVE:  Note: Objective measures were completed at Evaluation unless otherwise noted.  DIAGNOSTIC FINDINGS: see PMH section  COGNITION: Overall cognitive status: History of cognitive impairments - at baseline and pt reports difficulty with memory and speech, but declines referral to ST services at this time.   SENSATION: Light touch: WFL and pt reports duller sensation R > L  COORDINATION: LE RAMS:  slow and uncoordinated BLE Heel-to-shin:  dysmetric  EDEMA:  None significant in BLE  MUSCLE TONE: Pt unable to relax fully for assessment, possible left LE clonus  POSTURE: No Significant postural limitations  LOWER EXTREMITY ROM:     Active  Right Eval Left Eval  Hip flexion WNL in sitting  Hip extension   Hip abduction   Hip adduction   Hip internal rotation   Hip external rotation   Knee flexion    Knee extension   Ankle dorsiflexion   Ankle plantarflexion   Ankle inversion    Ankle eversion     (Blank rows = not tested)  LOWER EXTREMITY MMT:    MMT Right Eval Left Eval  Hip flexion 4 3  Hip extension    Hip abduction 4+ 4+  Hip adduction    Hip internal rotation    Hip external rotation    Knee flexion 4+ 4+  Knee extension 4+ 4  Ankle dorsiflexion 3+ 4-  Ankle plantarflexion    Ankle inversion    Ankle eversion    (Blank rows = not tested)  BED MOBILITY:  Pt uses features of hospital bed for independence at home  TRANSFERS: Assistive device utilized: Environmental Consultant - 2 wheeled  Sit to stand: Mod A Stand to sit: Mod A Chair to chair: Mod A Floor: Total A  GAIT: Gait pattern: decreased step length- Right, decreased step length- Left, decreased stride length, decreased hip/knee flexion- Right, decreased hip/knee flexion- Left, decreased ankle dorsiflexion- Right, decreased ankle dorsiflexion- Left, ataxic, decreased trunk rotation, trunk flexed, narrow BOS, poor foot clearance- Right, and poor foot clearance- Left Distance walked: 8 ft Assistive device utilized: Walker - 2 wheeled and +2 w/c follow Level of assistance: Min A Comments: Pt catheter bag falls to floor at end of distance w/ pt attempting to reach and pick it up, PT discouraged this due to far distance of walker placement.  He loses his footing/staggers a few times during distance resulting in lifting walker from floor.   FUNCTIONAL TESTS:  None completed due  to possible pt tone and ataxia.  PATIENT SURVEYS:  None completed due to time.                                                                                                                              TREATMENT DATE: N/A - eval only.    PATIENT EDUCATION: Education details: PT POC, assessments used, and goals to be set.  Bowel incontinence in brief and skin integrity - notifying PT so PT can help clean him up as this was noted at end of session  today and pt's father states they will take care of it at home, pt declined offer of assistance with this today.  2WW safety.  Brake management of wheelchair - pt attempts to stand from unlocked chair prior to gait. Person educated: Patient and Parent Education method: Explanation Education comprehension: verbalized understanding and needs further education  HOME EXERCISE PROGRAM: To be established.  GOALS: Goals reviewed with patient? Yes  SHORT TERM GOALS: Target date: 08/24/2023  Pt will be no more than supervision level and compliant with initial standing strength and balance focused HEP in order to maintain functional progress and improve mobility. Baseline:  To be established. Goal status: INITIAL  2.  Pt will ambulate >/=50 feet with LRAD and no more than supervision level of assist to promote household and community access. Baseline: 8 ft CGA w/ +2 w/c follow Goal status: INITIAL  3.  PT to assess need for bracing with ambulation/gait fatigue in relation to foot-drop and request order to obtain as appropriate. Baseline: Will further assess. Goal status: INITIAL  4.  5xSTS to be assessed w/ goal set as appropriate. Baseline:  To be assessed. Goal status: INITIAL  LONG TERM GOALS: Target date: 09/21/2023  Pt will be no more than supervision level and compliant with finalized standing strength and balance focused HEP in order to maintain functional progress and improve mobility. Baseline: To be established. Goal status: INITIAL  2.  Pt will ambulate >/=150 feet with LRAD and no more than supervision level of assist to promote household and community access. Baseline: 8 ft CGA w/ +2 w/c follow Goal status: INITIAL  3.  5xSTS to be assessed w/ goal set as appropriate. Baseline: To be assessed. Goal status: INITIAL  4.  Pt will perform floor recovery with no more than modA in order to improve mobility in the home in the event of a fall. Baseline: Sister usually lifting him  TotalA into chair when he falls. Goal status: INITIAL  ASSESSMENT:  CLINICAL IMPRESSION: Patient is a 43 y.o. male who was seen today for physical therapy evaluation and treatment for chronic deficits related to RRMS.  Pt has a significant PMH of BPD 1, RRMS since 2001, polysubstance dependence, low back pain with sciatica, GAD, and suprapubic catheter w/ recurrent UTI.  Identified impairments include inability to perform floor recovery, generalized LE weakness, ataxic gait, poor safety awareness, poor insight to deficits and  decreased endurance.  Based on deficits noted today and history of falls pt is a high recurrent fall risk.  He would benefit from skilled PT to address impairments as noted and progress towards long term goals.  OBJECTIVE IMPAIRMENTS: Abnormal gait, decreased activity tolerance, decreased balance, decreased cognition, decreased coordination, decreased endurance, decreased knowledge of condition, decreased knowledge of use of DME, decreased mobility, difficulty walking, decreased ROM, decreased strength, decreased safety awareness, increased muscle spasms, impaired sensation, impaired tone, and improper body mechanics.   ACTIVITY LIMITATIONS: carrying, lifting, bending, standing, squatting, stairs, transfers, bathing, toileting, dressing, reach over head, hygiene/grooming, and locomotion level  PARTICIPATION LIMITATIONS: meal prep, cleaning, laundry, medication management, driving, shopping, community activity, occupation, and yard work  PERSONAL FACTORS: Age, Behavior pattern, Fitness, Past/current experiences, Social background, Time since onset of injury/illness/exacerbation, and 1-2 comorbidities: recurrent UTI and chronic LBP  are also affecting patient's functional outcome.   REHAB POTENTIAL: Fair noncompliance to infusion regimen, multiple lesion sites and recent relapse, length of time without rehab prior  CLINICAL DECISION MAKING: Evolving/moderate  complexity  EVALUATION COMPLEXITY: Moderate  PLAN:  PT FREQUENCY: 1x/week (conserving visits for OT)   PT DURATION: 8 weeks  PLANNED INTERVENTIONS: 97164- PT Re-evaluation, 97110-Therapeutic exercises, 97530- Therapeutic activity, 97112- Neuromuscular re-education, 97535- Self Care, 02859- Manual therapy, [phone]- Gait training, [phone]- Orthotic Fit/training, [phone]- Electrical stimulation (manual), Patient/Family education, Balance training, Stair training, Vestibular training, DME instructions, and Wheelchair mobility training  PLAN FOR NEXT SESSION: ASSESS 5xSTS - set goals as able.  Gait training w/ +2 w/c follow - foot drop/bracing?  Initiate standing balance and strength HEP - pt lives alone and needs to be independent with this.  Floor recovery - +2?  Sister typically providing transportation and assistance at home as she lives next door!  Check all possible CPT codes: See Planned Interventions List for Planned CPT Codes    Check all conditions that are expected to impact treatment: Cognitive Impairment or Intellectual disability, Contractures, spasticity or fracture relevant to requested treatment, Neurological condition and/or seizures, and Social determinants of health   If treatment provided at initial evaluation, no treatment charged due to lack of authorization.    Daved KATHEE Bull, PT, DPT 07/25/2023, 5:45 PM

## 2023-07-25 NOTE — Telephone Encounter (Signed)
 Dr. Vear, Bradley Brandt was evaluated by physical therapy on 07/25/2023.  The patient would benefit from OT evaluation for reported fine motor difficulties and decreased participation in ADLs.  He declined recommendation for ST at this time. If you agree, please place an order in [facility] workque in [facility] or fax the order to [phone]. Thank you, Daved Bull, PT, DPT  [facility] [address] [place], [place]  [address] Phone:  [phone] Fax:  [phone]

## 2023-07-26 ENCOUNTER — Other Ambulatory Visit: Payer: Self-pay | Admitting: Neurology

## 2023-07-26 DIAGNOSIS — Z7409 Other reduced mobility: Secondary | ICD-10-CM

## 2023-07-26 DIAGNOSIS — G35 Multiple sclerosis: Secondary | ICD-10-CM | POA: Diagnosis not present

## 2023-07-27 ENCOUNTER — Other Ambulatory Visit: Payer: Self-pay | Admitting: Urology

## 2023-07-27 DIAGNOSIS — G35 Multiple sclerosis: Secondary | ICD-10-CM | POA: Diagnosis not present

## 2023-07-27 DIAGNOSIS — M6282 Rhabdomyolysis: Secondary | ICD-10-CM | POA: Diagnosis not present

## 2023-07-27 DIAGNOSIS — M6281 Muscle weakness (generalized): Secondary | ICD-10-CM | POA: Diagnosis not present

## 2023-07-31 ENCOUNTER — Encounter (HOSPITAL_COMMUNITY): Payer: Self-pay

## 2023-07-31 ENCOUNTER — Other Ambulatory Visit: Payer: Self-pay

## 2023-07-31 ENCOUNTER — Emergency Department (HOSPITAL_COMMUNITY)
Admission: EM | Admit: 2023-07-31 | Discharge: 2023-07-31 | Discharge status: Against Medical Advice | Payer: Medicaid Other | Attending: Emergency Medicine | Admitting: Emergency Medicine

## 2023-07-31 DIAGNOSIS — G35 Multiple sclerosis: Secondary | ICD-10-CM | POA: Insufficient documentation

## 2023-07-31 DIAGNOSIS — R309 Painful micturition, unspecified: Secondary | ICD-10-CM | POA: Insufficient documentation

## 2023-07-31 DIAGNOSIS — M79604 Pain in right leg: Secondary | ICD-10-CM | POA: Diagnosis not present

## 2023-07-31 DIAGNOSIS — R531 Weakness: Secondary | ICD-10-CM | POA: Diagnosis not present

## 2023-07-31 DIAGNOSIS — R300 Dysuria: Secondary | ICD-10-CM

## 2023-07-31 DIAGNOSIS — R059 Cough, unspecified: Secondary | ICD-10-CM | POA: Diagnosis not present

## 2023-07-31 DIAGNOSIS — R197 Diarrhea, unspecified: Secondary | ICD-10-CM | POA: Diagnosis not present

## 2023-07-31 DIAGNOSIS — Z743 Need for continuous supervision: Secondary | ICD-10-CM | POA: Diagnosis not present

## 2023-07-31 NOTE — ED Triage Notes (Signed)
Pt arrived reporting MS flare. States R leg pain. Denies any shob, dizziness or any other symptoms.

## 2023-07-31 NOTE — Discharge Instructions (Signed)
Thank you for letting us evaluate you today.  We have sent your urine culture and we will inform you with results to ensure that you are on the correct antibiotic and that your UTI is improving. Please return to emergency department if your symptoms worsen, have shortness of breath, chest pain, dizziness, headache

## 2023-07-31 NOTE — ED Notes (Signed)
Patient asking for a food tray. Patient signed electronic AMA form. Patient did not want to wait. Lauren, PA notified.

## 2023-07-31 NOTE — ED Provider Notes (Signed)
[place] EMERGENCY DEPARTMENT AT [facility] Provider Note   CSN: [id number] Arrival date & time: 07/31/23  1344     History  Chief Complaint  Patient presents with   Multiple Sclerosis    Bradley Brandt is a 43 y.o. male with past medical history of bipolar disorder, polysubstance use, MS, frequent UTIs presents to emergency department for evaluation of pain around suprapubic catheter and right leg pain. He denies difficulty flushing catheter or decreased output. He endorses that this feels similar to an MS flare.  He denies shortness of breath, dizziness, headache, recent trauma.  Was recently placed on Bactrim for possible UTI on 07/18/23 by Neurology  HPI     Home Medications Prior to Admission medications   Medication Sig Start Date End Date Taking? Authorizing Provider  DULoxetine (CYMBALTA) 30 MG capsule Take 1 capsule (30 mg total) by mouth daily. Patient not taking: Reported on 07/18/2023 01/17/23   Donell Beers, FNP  gabapentin (NEURONTIN) 400 MG capsule Take 1 capsule (400 mg total) by mouth 3 (three) times daily. Patient not taking: Reported on 07/18/2023 01/19/23   Donell Beers, FNP  natalizumab (TYSABRI) 300 MG/15ML injection Inject 15 mLs (300 mg total) into the vein every 28 (twenty-eight) days. 03/20/23   Sater, Pearletha Furl, MD  oxybutynin (DITROPAN-XL) 10 MG 24 hr tablet Take 1 tablet (10 mg total) by mouth daily. 01/17/23   Donell Beers, FNP  sulfamethoxazole-trimethoprim (BACTRIM) 400-80 MG tablet Take 1 tablet by mouth 2 (two) times daily. 07/18/23   Sater, Pearletha Furl, MD  Vitamin D, Ergocalciferol, (DRISDOL) 1.25 MG (50000 UNIT) CAPS capsule Take 1 capsule (50,000 Units total) by mouth every 7 (seven) days. 07/22/23   Sater, Pearletha Furl, MD      Allergies    Lithium    Review of Systems   Review of Systems  Constitutional:  Negative for chills, fatigue and fever.  Respiratory:  Negative for cough, chest tightness, shortness of breath  and wheezing.   Cardiovascular:  Negative for chest pain and palpitations.  Gastrointestinal:  Negative for abdominal pain, constipation, diarrhea, nausea and vomiting.  Neurological:  Negative for dizziness, seizures, weakness, light-headedness, numbness and headaches.    Physical Exam Updated Vital Signs BP (!) 124/114   Pulse 73   Temp (!) 97.3 F (36.3 C) (Oral)   Resp 16   Ht 5\' 11"  (1.803 m)   Wt 80.7 kg   BMI 24.81 kg/m  Physical Exam Vitals and nursing note reviewed.  Constitutional:      General: He is not in acute distress.    Appearance: Normal appearance.  HENT:     Head: Normocephalic and atraumatic.  Eyes:     Conjunctiva/sclera: Conjunctivae normal.  Cardiovascular:     Rate and Rhythm: Normal rate.  Pulmonary:     Effort: Pulmonary effort is normal. No respiratory distress.  Abdominal:     General: Bowel sounds are normal. There is no distension.     Palpations: Abdomen is soft.     Tenderness: There is abdominal tenderness in the suprapubic area. There is no guarding.     Comments: Suprapubic catheter  Musculoskeletal:     Cervical back: Normal range of motion and neck supple. No rigidity or tenderness.     Right lower leg: No edema.     Left lower leg: No edema.     Comments: Hypertonia of BLE. TTP of entire RLE without localization.   Skin:    General:  Skin is warm.     Capillary Refill: Capillary refill takes less than 2 seconds.     Coloration: Skin is not jaundiced or pale.     Comments: TTP to region surrounding suprapubic catheter. Area around suprapubic catheter is without erythema, warmth, fluctuance, pus, drainage. No swelling, warmth, fluctuance to RLE.  Neurological:     Mental Status: He is alert and oriented to person, place, and time. Mental status is at baseline.     Comments: Fully alert and oriented. Following commands appropriately. 5/5 of BUE and 4/5 of BLE.      ED Results / Procedures / Treatments   Labs (all labs ordered  are listed, but only abnormal results are displayed) Labs Reviewed  URINE CULTURE    EKG None  Radiology No results found.  Procedures Procedures    Medications Ordered in ED Medications - No data to display  ED Course/ Medical Decision Making/ A&P                                 Medical Decision Making Amount and/or Complexity of Data Reviewed Labs: ordered.   Patient presents to the ED for concern of RLE pain and painful urination, this involves an extensive number of treatment options, and is a complaint that carries with it a high risk of complications and morbidity.  The differential diagnosis includes dvt, cellulitis, trauma, sciatica, uti, infection around catheter   Co morbidities that complicate the patient evaluation  Recurrent UTI Chronic suprapubic catheter MS   Additional history obtained:  Additional history obtained from Nursing and Outside Medical Records   External records from outside source obtained and reviewed including  Triage RN note Neurology note from 07/20/2023 Diagnosed with acute cystitis and placed on Bactrim   Lab Tests:  I Ordered, and personally interpreted labs.  The pertinent results include:   Urine culture pending All other labs canceled as patient wants to leave AMA prior to completion of ED workup    Test Considered:  Labs, CT    Problem List / ED Course:  RLE pain No signs of cellulitis, infection, swelling.  He endorses that this is similar to previous MS flares. he has no abnormal neurological findings from his baseline.  He denies dizziness, headaches, visual disturbances May be related to sciatica, MS flare, DVT.  However, patient left prior to completion of ED workup Suprapubic pain around catheter and burning with urination Plan to obtain CBC, CMP, UA, urine culture however patient wanted to leave prior to completion of ED workup I think that it is unlikely that he has an infection around suprapubic  catheter with reassuring physical exam.  Upon chart review, appears that he frequently has tenderness around this suprapubic catheter.  It is without erythema, warmth, fluctuance, nor drainage He is currently on Bactrim for suspected cystitis.  Will send urine culture to ensure antibiotic susceptibility   Reevaluation:  After the interventions noted above, I reevaluated the patient and found that they have :stayed the same   Social Determinants of Health:  Has PCP, allergy, neurology follow-up   Dispostion:  Patient left prior to completion of ED workup.  He is leaving against medical vice.  He is fully capable and able to make this decision. Will send urine culture to ensure antibiotic susceptibility  Date: 07/31/2023 Patient: Bradley Brandt Admitted: 07/31/2023  2:04 PM Attending Provider: Bethann Berkshire, MD  Bradley Brandt or his  authorized caregiver has made the decision for the patient to leave the emergency department against the advice of Bethann Berkshire, MD.  He or his authorized caregiver has been informed and understands the inherent risks, including death.  He or his authorized caregiver has decided to accept the responsibility for this decision. Bradley Brandt and all necessary parties have been advised that he may return for further evaluation or treatment. His condition at time of discharge was Stable.  Bradley Brandt had current vital signs as follows:  Blood pressure (!) 124/114, pulse 73, temperature (!) 97.3 F (36.3 C), temperature source Oral, resp. rate 16, height 5\' 11"  (1.803 m), weight 80.7 kg.   Bradley Brandt or his authorized caregiver has signed the Leaving Against Medical Advice form prior to leaving the department.  Judithann Sheen 07/31/2023   Final Clinical Impression(s) / ED Diagnoses Final diagnoses:  Multiple sclerosis (HCC)  Burning with urination  Right leg pain    Rx / DC Orders ED Discharge Orders     None         Judithann Sheen, PA 07/31/23 1626    Bethann Berkshire, MD 08/01/23 1317

## 2023-08-01 ENCOUNTER — Encounter: Payer: Self-pay | Admitting: Physical Therapy

## 2023-08-01 ENCOUNTER — Ambulatory Visit: Payer: Medicaid Other | Admitting: Occupational Therapy

## 2023-08-01 ENCOUNTER — Ambulatory Visit: Payer: Medicaid Other | Admitting: Physical Therapy

## 2023-08-01 DIAGNOSIS — R2689 Other abnormalities of gait and mobility: Secondary | ICD-10-CM

## 2023-08-01 DIAGNOSIS — R2681 Unsteadiness on feet: Secondary | ICD-10-CM

## 2023-08-01 DIAGNOSIS — M6281 Muscle weakness (generalized): Secondary | ICD-10-CM

## 2023-08-01 DIAGNOSIS — R41840 Attention and concentration deficit: Secondary | ICD-10-CM

## 2023-08-01 DIAGNOSIS — R296 Repeated falls: Secondary | ICD-10-CM | POA: Diagnosis not present

## 2023-08-01 DIAGNOSIS — R278 Other lack of coordination: Secondary | ICD-10-CM

## 2023-08-01 DIAGNOSIS — R208 Other disturbances of skin sensation: Secondary | ICD-10-CM

## 2023-08-01 DIAGNOSIS — M62838 Other muscle spasm: Secondary | ICD-10-CM

## 2023-08-01 DIAGNOSIS — G35 Multiple sclerosis: Secondary | ICD-10-CM | POA: Diagnosis not present

## 2023-08-01 NOTE — Patient Instructions (Signed)
Access Code: [email or web address] URL: [email or web address] Date: 08/01/2023 Prepared by: Camille Bal   Exercises - Sit to Stand with Counter Support  - 1 x daily - 5 x weekly - 3 sets - 6 reps - Side to Side Weight Shift with Counter Support  - 1 x daily - 7 x weekly - 1-2 sets - 10 reps - Forward Backward Weight Shift with Counter Support  - 1 x daily - 7 x weekly - 1-2 sets - 10 reps - Seated March  - 1 x daily - 7 x weekly - 3 sets - 10 reps

## 2023-08-01 NOTE — Therapy (Signed)
OUTPATIENT OCCUPATIONAL THERAPY NEURO EVALUATION  Patient Name: Bradley Brandt MRN: [id number] DOB:Nov 12, 1980, 43 y.o., male Today's Date: 08/01/2023  PCP: Edwin Dada, Elvera Lennox, FNP REFERRING PROVIDER: Asa Lente, MD  END OF SESSION:  OT End of Session - 08/01/23 1255     Visit Number 1    Number of Visits 9    Date for OT Re-Evaluation 09/29/23    Authorization Type [employer] MCD    OT Start Time 0930    OT Stop Time 1015    OT Time Calculation (min) 45 min    Activity Tolerance Patient tolerated treatment well    Behavior During Therapy Flat affect             Past Medical History:  Diagnosis Date   Anxiety    Bipolar 1 disorder (HCC)    Bipolar disorder, unspecified (HCC) 01/03/2013   MS (multiple sclerosis) (HCC) dx 2006   relapsing-remitting right sided weakness   Neuromuscular disorder (HCC)    MS   Pneumonia    Right spastic hemiplegia (HCC)    Substance abuse (HCC)    Suprapubic catheter [facility])    Past Surgical History:  Procedure Laterality Date   BOTOX INJECTION N/A 03/12/2023   Procedure: BOTOX INJECTION;  Surgeon: Crista Elliot, MD;  Location: [facility];  Service: Urology;  Laterality: N/A;   INSERTION OF SUPRAPUBIC CATHETER N/A 03/12/2023   Procedure: SUPRAPUBIC CATHETER EXCHANGE;  Surgeon: Crista Elliot, MD;  Location: [facility];  Service: Urology;  Laterality: N/A;   IR CATHETER TUBE CHANGE  10/31/2022   IR CATHETER TUBE CHANGE  11/02/2022   IR FLUORO GUIDED NEEDLE PLC ASPIRATION/INJECTION LOC  09/07/2022   TUMOR REMOVAL  1983   abdomen benign   Patient Active Problem List   Diagnosis Date Noted   Need for influenza vaccination 07/20/2023   Hospital discharge follow-up 04/20/2023   Malnutrition of moderate degree 04/11/2023   Acute pyelonephritis 04/03/2023   AKI (acute kidney injury) (HCC) 04/03/2023   Suspected UTI 02/06/2023   Urinary incontinence 02/06/2023   Suprapubic catheter (HCC) 01/31/2023   Marijuana user 01/17/2023   Tobacco  use disorder, continuous 01/17/2023   Impaired mobility and ADLs 01/17/2023   UTI (urinary tract infection) 10/30/2022   Bipolar 1 disorder (HCC) 10/30/2022   Acute urinary retention 08/01/2022   Fever 08/01/2022   Rhabdomyolysis 07/30/2022   Multiple sclerosis exacerbation (HCC) 07/12/2022   Marijuana use 11/22/2020   History of marijuana use 11/22/2020   Chronic pain syndrome 11/21/2020   Encounter for examination following treatment at hospital 11/21/2020   Disorder of skeletal system 11/21/2020   Problems influencing health status 11/21/2020   MS (multiple sclerosis) (HCC)    Neuromuscular disorder (HCC)    Bipolar disorder in full remission (HCC) 11/16/2020   Other insomnia 08/11/2020   Generalized anxiety disorder 08/11/2020   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HCC) 12/31/2017   Optic neuritis 08/02/2017   High risk medication use 08/02/2017   Vitamin D deficiency 09/19/2016   Hypercholesterolemia 09/19/2016   Numbness 12/16/2015   Spastic gait 12/16/2015   Urinary hesitancy 12/16/2015   Low back pain with sciatica 09/28/2014   Polysubstance dependence (HCC) 02/09/2014   Combined drug dependence excluding opioids (HCC) 02/09/2014   Relapsing remitting multiple sclerosis (HCC) 05/28/2013   Elevated BP 01/06/2013   Bipolar disorder, unspecified (HCC) 01/03/2013   Bipolar disorder (HCC) 01/03/2013   Right hemiparesis (HCC) 01/02/2013   Multiple sclerosis flare 01/02/2013   Hemiplegia (HCC) 01/02/2013  ONSET DATE: 07/26/2023 (referral date)   REFERRING DIAG:  G35 (ICD-10-CM) - MS (multiple sclerosis) (HCC)  Z74.09,Z78.9 (ICD-10-CM) - Impaired mobility and ADLs    THERAPY DIAG:  Other lack of coordination  Unsteadiness on feet  Attention and concentration deficit  Other disturbances of skin sensation  Muscle weakness (generalized)  Rationale for Evaluation and Treatment: Rehabilitation  SUBJECTIVE:   SUBJECTIVE STATEMENT: My symptoms have gotten worse last 2  years Pt accompanied by: self (father dropped him off at clinic)   PERTINENT HISTORY: MS diagnosed 2006 (pt was imprisoned when receiving diagnosis - but he received medical care/infusions for MS and was walking fully when released from prison)  Pt w/ increased decline when he missed several infusions in late 2023/2024 per MD note. PMH: Bipolar1 d/o, anxiety, substance abuse  PRECAUTIONS: Fall and Other: foley catheter, no driving  WEIGHT BEARING RESTRICTIONS: No  PAIN:  Are you having pain?  Only pain at catheter site  FALLS: Has patient fallen in last 6 months? No per pt report, but pt reports multiple falls  LIVING ENVIRONMENT: Lives with: lives alone Lives in: 1 story house w/ ramp to enter Has following equipment at home: Dan Humphreys - 2 wheeled, Wheelchair (power), Wheelchair (manual), Shower bench, bed side commode, and Grab bars  PLOF: Independent with basic ADLs  PATIENT GOALS: to walk  OBJECTIVE:  Note: Objective measures were completed at Evaluation unless otherwise noted.  HAND DOMINANCE: Right  ADLs: Overall ADLs: mod I for BADLS per pt report Transfers/ambulation related to ADLs: uses w/c, can transfer himself  Eating: mod I  Grooming: mod I  UB Dressing: mod I  LB Dressing: mod I  Toileting: mod I  Bathing: mod I  Tub Shower transfers: mod I however question safety with this Equipment: Shower seat with back and Grab bars  IADLs: Shopping: sister or girlfriend gets groceries Light housekeeping: pt washes dishes, girlfriend does laundry and vacuums Meal Prep: pt heats things up in microwave, girlfriend or sister does cooking Community mobility: sister/family usually brings her Medication management: girlfriend assist patient Financial management: automatic withdrawl Handwriting:  approx 70% legibility for signature, 90% in print (vision also affected)   MOBILITY STATUS: pt uses w/c but transfers himself - P.T. reports he is unsafe with transfers and needs  supervision d/t multiple falls, forgetting to lock w/c, etc   UPPER EXTREMITY ROM:  BUE AROM WNL's however w/ ataxia and dystonia in movements Lt hand small finger with PIP hyperextension and finger abduction when attempting full extension d/t movement pattern   UPPER EXTREMITY MMT:   grossly 5/5 BUE's, however endurance may be affected if done multiple times   HAND FUNCTION: Grip strength: Right: 74 lbs; Left: 80 lbs  COORDINATION: 9 Hole Peg test: Right: 69.48 sec; Left: 82.01 sec Ataxia noted  SENSATION: Appears WFL's for light touch but inconsistent on location  EDEMA: none observed  MUSCLE TONE: dystonias  COGNITION: Overall cognitive status:  difficult to assess, pt also with dysarthria, but can follow simple 1-2 step commands. Appears to have decreased safety awareness/cognitive deficits and question accuracy of above report with ADLS but no family back in clinic to confirm   VISION: Subjective report: I can't see without my glasses Baseline vision: Wears glasses all the time Visual history:  none. However does report diplopia with MS  VISION ASSESSMENT: To be further assessed in functional context  Patient has difficulty with following activities due to following visual impairments: none reported  PERCEPTION: Not tested  PRAXIS: Not  tested  OBSERVATIONS: Dysarthria, ataxic movements and decreased coordination, dystonia, uses w/c for mobility, unsafe with transfers, decreased safety awareness                                                                                                                             TREATMENT DATE:    OT POC      PATIENT EDUCATION: Education details: OT POC Person educated: Patient Education method: Explanation Education comprehension: verbalized understanding  HOME EXERCISE PROGRAM: N/A   GOALS: Goals reviewed with patient? Yes  SHORT TERM GOALS: Target date: 08/29/23  Independent with HEP for bilateral  coordination  Baseline: Goal status: INITIAL  2.  Pt to improve coordination by 5 sec bilaterally on 9 hole peg test Baseline: Rt = 69 sec, Lt = 82 sec Goal status: INITIAL  3.  Pt to demo safety with functional transfer to BSC/toilet w/ no more than min cues Baseline: unsafe, cues to lock w/c  Goal status: INITIAL  4.  Pt to sign name with 90% legibility Baseline: approx 70% Goal status: INITIAL   LONG TERM GOALS: Target date: 09/29/23  Pt to stand at countertop for 5 min for functional task with one hand support prn Baseline:  Goal status: INITIAL  2.  Pt to improve coordination bilaterally by 10 sec on 9 hole peg test Baseline: Rt = 69 sec, Lt = 82 sec Goal status: INITIAL  3.  Pt to demo making sandwich mod I safely from w/c level prn, standing only to retrieve plate from higher shelf Baseline:  Goal status: INITIAL   ASSESSMENT:  CLINICAL IMPRESSION: Patient is a 43 y.o. male who was seen today for occupational therapy evaluation for relapsing remitting MS. Hx includes bipolar d/o, anxiety. Patient currently presents below baseline level of functioning demonstrating functional deficits and impairments as noted below. Pt would benefit from skilled OT services in the outpatient setting to work on impairments as noted below to help pt return to PLOF as able.   [name]   PERFORMANCE DEFICITS: in functional skills including ADLs, IADLs, coordination, sensation, strength, Fine motor control, Gross motor control, mobility, balance, endurance, decreased knowledge of precautions, and UE functional use, cognitive skills including problem solving and safety awareness, and psychosocial skills including coping strategies.   IMPAIRMENTS: are limiting patient from ADLs and IADLs.   CO-MORBIDITIES: may have co-morbidities  that affects occupational performance. Patient will benefit from skilled OT to address above impairments and improve overall function.  MODIFICATION OR ASSISTANCE TO  COMPLETE EVALUATION: No modification of tasks or assist necessary to complete an evaluation.  OT OCCUPATIONAL PROFILE AND HISTORY: Detailed assessment: Review of records and additional review of physical, cognitive, psychosocial history related to current functional performance.  CLINICAL DECISION MAKING: Moderate - several treatment options, min-mod task modification necessary  REHAB POTENTIAL: Fair time since decline, ? Family/social support  EVALUATION COMPLEXITY: Moderate    PLAN:  OT FREQUENCY: 1x/week  OT DURATION: 8 weeks (Plus  eval)   PLANNED INTERVENTIONS: 97535 self care/ADL training, 16109 therapeutic exercise, 97530 therapeutic activity, 97112 neuromuscular re-education, 97140 manual therapy, passive range of motion, functional mobility training, energy conservation, coping strategies training, patient/family education, and DME and/or AE instructions  RECOMMENDED OTHER SERVICES: none at this time  CONSULTED AND AGREED WITH PLAN OF CARE: Patient  PLAN FOR NEXT SESSION: coordination HEP, transfers   Sheran Lawless, OT 08/01/2023, 12:56 PM

## 2023-08-01 NOTE — Therapy (Signed)
OUTPATIENT PHYSICAL THERAPY NEURO TREATMENT   Patient Name: Bradley Brandt MRN: [id number] DOB:1980/09/09, 43 y.o., male Today's Date: 08/01/2023   PCP: Donell Beers, FNP REFERRING PROVIDER: Asa Lente, MD  END OF SESSION:  PT End of Session - 08/01/23 0851     Visit Number 2    Number of Visits 9   8 + eval   Date for PT Re-Evaluation 09/28/23    Authorization Type [place] MEDICAID [employer]    PT Start Time 0848    PT Stop Time 0930    PT Time Calculation (min) 42 min    Equipment Utilized During Treatment Gait belt    Activity Tolerance Patient tolerated treatment well    Behavior During Therapy Flat affect             Past Medical History:  Diagnosis Date   Anxiety    Bipolar 1 disorder (HCC)    Bipolar disorder, unspecified (HCC) 01/03/2013   MS (multiple sclerosis) (HCC) dx 2006   relapsing-remitting right sided weakness   Neuromuscular disorder (HCC)    MS   Pneumonia    Right spastic hemiplegia (HCC)    Substance abuse (HCC)    Suprapubic catheter (HCC)    Past Surgical History:  Procedure Laterality Date   BOTOX INJECTION N/A 03/12/2023   Procedure: BOTOX INJECTION;  Surgeon: Crista Elliot, MD;  Location: [facility];  Service: Urology;  Laterality: N/A;   INSERTION OF SUPRAPUBIC CATHETER N/A 03/12/2023   Procedure: SUPRAPUBIC CATHETER EXCHANGE;  Surgeon: Crista Elliot, MD;  Location: [facility];  Service: Urology;  Laterality: N/A;   IR CATHETER TUBE CHANGE  10/31/2022   IR CATHETER TUBE CHANGE  11/02/2022   IR FLUORO GUIDED NEEDLE PLC ASPIRATION/INJECTION LOC  09/07/2022   TUMOR REMOVAL  1983   abdomen benign   Patient Active Problem List   Diagnosis Date Noted   Need for influenza vaccination 07/20/2023   Hospital discharge follow-up 04/20/2023   Malnutrition of moderate degree 04/11/2023   Acute pyelonephritis 04/03/2023   AKI (acute kidney injury) (HCC) 04/03/2023   Suspected UTI 02/06/2023   Urinary incontinence  02/06/2023   Suprapubic catheter (HCC) 01/31/2023   Marijuana user 01/17/2023   Tobacco use disorder, continuous 01/17/2023   Impaired mobility and ADLs 01/17/2023   UTI (urinary tract infection) 10/30/2022   Bipolar 1 disorder (HCC) 10/30/2022   Acute urinary retention 08/01/2022   Fever 08/01/2022   Rhabdomyolysis 07/30/2022   Multiple sclerosis exacerbation (HCC) 07/12/2022   Marijuana use 11/22/2020   History of marijuana use 11/22/2020   Chronic pain syndrome 11/21/2020   Encounter for examination following treatment at hospital 11/21/2020   Disorder of skeletal system 11/21/2020   Problems influencing health status 11/21/2020   MS (multiple sclerosis) (HCC)    Neuromuscular disorder (HCC)    Bipolar disorder in full remission (HCC) 11/16/2020   Other insomnia 08/11/2020   Generalized anxiety disorder 08/11/2020   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HCC) 12/31/2017   Optic neuritis 08/02/2017   High risk medication use 08/02/2017   Vitamin D deficiency 09/19/2016   Hypercholesterolemia 09/19/2016   Numbness 12/16/2015   Spastic gait 12/16/2015   Urinary hesitancy 12/16/2015   Low back pain with sciatica 09/28/2014   Polysubstance dependence (HCC) 02/09/2014   Combined drug dependence excluding opioids (HCC) 02/09/2014   Relapsing remitting multiple sclerosis (HCC) 05/28/2013   Elevated BP 01/06/2013   Bipolar disorder, unspecified (HCC) 01/03/2013   Bipolar disorder (HCC) 01/03/2013  Right hemiparesis (HCC) 01/02/2013   Multiple sclerosis flare 01/02/2013   Hemiplegia (HCC) 01/02/2013    ONSET DATE: 2001 (initial diagnosis)  REFERRING DIAG: G35 (ICD-10-CM) - Relapsing remitting multiple sclerosis (HCC) R26.9 (ICD-10-CM) - Gait disturbance  THERAPY DIAG:  Repeated falls  Other abnormalities of gait and mobility  Other lack of coordination  Other muscle spasm  Muscle weakness (generalized)  Unsteadiness on feet  Rationale for Evaluation and Treatment:  Rehabilitation  SUBJECTIVE:                                                                                                                                                                                             SUBJECTIVE STATEMENT: Pt was recently in the ED regarding suprapubic pain which he states is still bothering him.  He is unsure what to do about this and is unsure what they did for him as he cannot recall.  He has not fallen since his last visit. Pt accompanied by: family member - Dad Windy Fast dropped him off  PERTINENT HISTORY: BPD 1, RRMS since 2001, polysubstance dependence, low back pain with sciatica, GAD, suprapubic catheter w/ recurrent UTI  From Neurologist notes: "diagnosed around 2001 when he presented with right-sided numbness and clumsiness and weakness"  "In late 2023/early 2024, he had missed several infusions and presented to the ED 07/11/2022 with an apparent rebound and an MRI showing multiple enbancing lesions in the brain.   There were multiple old spinal cord lesions and an enhancing new focus at T7.   He received 5 days of IV Solumedrol and was discharged.    He got his Tysabri infusion 07/26/2022 (looks like he had previous infusions 04/21/2022 and 03/25/2022)   We discussed Ocrevus or Kesimpta (as might improve compliance). Repeat imaging 07/31/2022 showed a new lest pontomedullary junction and new right centrum semiovale lesion compared to 07/12/2022.   MRI of the cervical and thoracic spine was unchanged.     We discussed the importance of compliance as the benefit will wear off after 2-3 months if doses are skipped.      He can take a few steps with a walker (was ablt to do 80100 feet before the exacerbation 06/2022).   Left foot drop has worsened and he now has trouble raising the left leg.  Balance is very poor and he cannot stand without strong bilateral support and cannot use a walker.[name]    He denies arm weakness or spasticity but is noted to have weakness in the left  arm when tested   no numbness or tingling   Bladder function is tpoor and has an Foley cath.  He has had increase bladder spasms the last week and family notes change in urine."  PAIN:  Are you having pain? Yes: NPRS scale: 10 Pain location: suprapubic Pain description: "excruciating pain" - pt declines need for further hospital services Aggravating factors: pushing on stomach Relieving factors: nothing  PRECAUTIONS: Fall  RED FLAGS: Bowel or bladder incontinence: Yes: pt reports saddle tingling for 3 weeks and he has not spoken to Dr. Epimenio Foot about this    WEIGHT BEARING RESTRICTIONS: No  FALLS: Has patient fallen in last 6 months? Yes. Number of falls several - unsure how many, usually when walking  LIVING ENVIRONMENT: Lives with: lives alone Lives in: House/apartment Stairs: No Has following equipment at home: Dan Humphreys - 2 wheeled, Wheelchair (manual), shower chair, and bed side commode  PLOF: Requires assistive device for independence, Needs assistance with ADLs, Needs assistance with homemaking, Needs assistance with gait, Needs assistance with transfers, and supposed to have 80 hrs of personal care, but has not received this yet, dad says he will follow-up  PATIENT GOALS: to walk  OBJECTIVE:  Note: Objective measures were completed at Evaluation unless otherwise noted.  DIAGNOSTIC FINDINGS: see PMH section  COGNITION: Overall cognitive status: History of cognitive impairments - at baseline and pt reports difficulty with memory and speech, but declines referral to ST services at this time.   SENSATION: Light touch: WFL and pt reports duller sensation R > L  COORDINATION: LE RAMS:  slow and uncoordinated BLE Heel-to-shin:  dysmetric  EDEMA:  None significant in BLE  MUSCLE TONE: Pt unable to relax fully for assessment, possible left LE clonus  POSTURE: No Significant postural limitations  LOWER EXTREMITY ROM:     Active  Right Eval Left Eval  Hip flexion WNL in  sitting  Hip extension   Hip abduction   Hip adduction   Hip internal rotation   Hip external rotation   Knee flexion   Knee extension   Ankle dorsiflexion   Ankle plantarflexion   Ankle inversion    Ankle eversion     (Blank rows = not tested)  LOWER EXTREMITY MMT:    MMT Right Eval Left Eval  Hip flexion 4 3  Hip extension    Hip abduction 4+ 4+  Hip adduction    Hip internal rotation    Hip external rotation    Knee flexion 4+ 4+  Knee extension 4+ 4  Ankle dorsiflexion 3+ 4-  Ankle plantarflexion    Ankle inversion    Ankle eversion    (Blank rows = not tested)  BED MOBILITY:  Pt uses features of hospital bed for independence at home  TRANSFERS: Assistive device utilized: Environmental consultant - 2 wheeled  Sit to stand: Mod A Stand to sit: Mod A Chair to chair: Mod A Floor: Total A  GAIT: Gait pattern: decreased step length- Right, decreased step length- Left, decreased stride length, decreased hip/knee flexion- Right, decreased hip/knee flexion- Left, decreased ankle dorsiflexion- Right, decreased ankle dorsiflexion- Left, ataxic, decreased trunk rotation, trunk flexed, narrow BOS, poor foot clearance- Right, and poor foot clearance- Left Distance walked: 8 ft Assistive device utilized: Walker - 2 wheeled and +2 w/c follow Level of assistance: Min A Comments: Pt catheter bag falls to floor at end of distance w/ pt attempting to reach and pick it up, PT discouraged this due to far distance of walker placement.  He loses his footing/staggers a few times during distance resulting in lifting walker from floor.   FUNCTIONAL TESTS:  None completed due to possible pt tone and ataxia.  PATIENT SURVEYS:  None completed due to time.                                                                                                                              TREATMENT DATE: 08/01/2023  5xSTS:  46.03 sec - max multimodal cuing to improve forward weight shift, hand placement, and  safety sitting vs falling back  Blocked practice STS for improved form (hips forward, anterior weight shift, reach back, and not pulling on 2WW) 3 rounds for carryover of cuing, pt able to tolerate less reps each round due to fatigue, he reports lightheadedness following last round, assessed BP: 130/76, HR 81bpm  Pull-to-stand at sink for lateral and anterior-posterior weight shifts to pt tolerance, discussed sitting when he feels tired to prevent falls and possibly having sister walk over a couple days a week to supervise these exercises - he states he will speak to her when he gets home.  Seated march x20, pt has some extensor tone following task resulting in mild posterior w/c tipping, but he is able to move feet forward and wait for spasticity to subside  PATIENT EDUCATION: Education details:  Discussed pt following up with a urologist regarding his ongoing pain and possible UTI.  2WW safety.  Brake management of wheelchair. Person educated: Patient and Parent Education method: Explanation Education comprehension: verbalized understanding and needs further education  HOME EXERCISE PROGRAM: Access Code: [email or web address] URL: [email or web address] Date: 08/01/2023 Prepared by: Camille Bal  Exercises - Sit to Stand with Counter Support  - 1 x daily - 5 x weekly - 3 sets - 6 reps - Side to Side Weight Shift with Counter Support  - 1 x daily - 7 x weekly - 1-2 sets - 10 reps - Forward Backward Weight Shift with Counter Support  - 1 x daily - 7 x weekly - 1-2 sets - 10 reps - Seated March  - 1 x daily - 7 x weekly - 3 sets - 10 reps  GOALS: Goals reviewed with patient? Yes  SHORT TERM GOALS: Target date: 08/24/2023  Pt will be no more than supervision level and compliant with initial standing strength and balance focused HEP in order to maintain functional progress and improve mobility. Baseline:  To be established. Goal status: INITIAL  2.  Pt will ambulate >/=50 feet  with LRAD and no more than supervision level of assist to promote household and community access. Baseline: 8 ft CGA w/ +2 w/c follow Goal status: INITIAL  3.  PT to assess need for bracing with ambulation/gait fatigue in relation to foot-drop and request order to obtain as appropriate. Baseline: Will further assess. Goal status: INITIAL  4.  Pt will decrease 5xSTS to </=41.03 seconds with improved form in order to demonstrate decreased risk for falls and improved functional bilateral LE strength and power. Baseline:  46.03 sec minA w/ max cuing for safety (2/12)  Goal status: INITIAL  LONG TERM GOALS: Target date: 09/21/2023  Pt will be no more than supervision level and compliant with finalized standing strength and balance focused HEP in order to maintain functional progress and improve mobility. Baseline: To be established. Goal status: INITIAL  2.  Pt will ambulate >/=150 feet with LRAD and no more than supervision level of assist to promote household and community access. Baseline: 8 ft CGA w/ +2 w/c follow Goal status: INITIAL  3.  Pt will decrease 5xSTS to </=36.03 seconds with improved form in order to demonstrate decreased risk for falls and improved functional bilateral LE strength and power. Baseline: 46.03 sec minA w/ max cuing for safety (2/12) Goal status: INITIAL  4.  Pt will perform floor recovery with no more than modA in order to improve mobility in the home in the event of a fall. Baseline: Sister usually lifting him TotalA into chair when he falls. Goal status: INITIAL  ASSESSMENT:  CLINICAL IMPRESSION: Emphasis of skilled session today on addressing functional weakness and imbalance.  He continues to be impacted by increased spasticity and dysmetria with most movements.  He is able to tolerate initiation of standing HEP, but PT recommending some supervision as able at home for weight shifting.  Pt recognizes his fatigue, but does not always initiate rest safely.   He continues to benefit from skilled PT to advance his gait mechanics and improve his motor planning and dynamic stability as able to reduce fall risk.  Continue per POC.  OBJECTIVE IMPAIRMENTS: Abnormal gait, decreased activity tolerance, decreased balance, decreased cognition, decreased coordination, decreased endurance, decreased knowledge of condition, decreased knowledge of use of DME, decreased mobility, difficulty walking, decreased ROM, decreased strength, decreased safety awareness, increased muscle spasms, impaired sensation, impaired tone, and improper body mechanics.   ACTIVITY LIMITATIONS: carrying, lifting, bending, standing, squatting, stairs, transfers, bathing, toileting, dressing, reach over head, hygiene/grooming, and locomotion level  PARTICIPATION LIMITATIONS: meal prep, cleaning, laundry, medication management, driving, shopping, community activity, occupation, and yard work  PERSONAL FACTORS: Age, Behavior pattern, Fitness, Past/current experiences, Social background, Time since onset of injury/illness/exacerbation, and 1-2 comorbidities: recurrent UTI and chronic LBP  are also affecting patient's functional outcome.   REHAB POTENTIAL: Fair noncompliance to infusion regimen, multiple lesion sites and recent relapse, length of time without rehab prior  CLINICAL DECISION MAKING: Evolving/moderate complexity  EVALUATION COMPLEXITY: Moderate  PLAN:  PT FREQUENCY: 1x/week (conserving visits for OT)   PT DURATION: 8 weeks  PLANNED INTERVENTIONS: 97164- PT Re-evaluation, 97110-Therapeutic exercises, 97530- Therapeutic activity, 97112- Neuromuscular re-education, 97535- Self Care, 40347- Manual therapy, [phone]- Gait training, [phone]- Orthotic Fit/training, [phone]- Electrical stimulation (manual), Patient/Family education, Balance training, Stair training, Vestibular training, DME instructions, and Wheelchair mobility training  PLAN FOR NEXT SESSION:   Gait training w/ +2 w/c  follow - foot drop/bracing?  Add supine/side-lying strengthening to HEP.  Standing balance and strength.  Floor recovery - +2? - pt lives alone and needs to be independent/modI when possible.  Sister typically providing transportation and assistance at home as she lives next door!  Check all possible CPT codes: See Planned Interventions List for Planned CPT Codes    Check all conditions that are expected to impact treatment: Cognitive Impairment or Intellectual disability, Contractures, spasticity or fracture relevant to requested treatment, Neurological condition and/or seizures, and Social determinants of health   If treatment provided at initial evaluation, no treatment charged due to lack of authorization.    Sadie Haber, PT, DPT 08/01/2023, 9:42 AM

## 2023-08-02 DIAGNOSIS — G35 Multiple sclerosis: Secondary | ICD-10-CM | POA: Diagnosis not present

## 2023-08-02 LAB — URINE CULTURE: Culture: >=100000 — AB

## 2023-08-03 DIAGNOSIS — G35 Multiple sclerosis: Secondary | ICD-10-CM | POA: Diagnosis not present

## 2023-08-03 NOTE — Progress Notes (Signed)
ED Antimicrobial Stewardship Positive Culture Follow Up   Bradley Brandt is an 43 y.o. male who presented to [facility] on 07/31/2023 with a chief complaint of  Chief Complaint  Patient presents with   Multiple Sclerosis    Recent Results (from the past 720 hours)  Culture, Urine     Status: Abnormal   Collection Time: 07/18/23  2:50 PM   Specimen: Urine   UR  Result Value Ref Range Status   Urine Culture, Routine Final report (A)  Final   Organism ID, Bacteria Escherichia coli (A)  Final    Comment: Multi-Drug Resistant Organism Susceptibility profile is consistent with a probable ESBL. Greater than 100,000 colony forming units per mL    Antimicrobial Susceptibility Comment  Final    Comment:       ** S = Susceptible; I = Intermediate; R = Resistant **                    P = Positive; N = Negative             MICS are expressed in micrograms per mL    Antibiotic                 RSLT#1    RSLT#2    RSLT#3    RSLT#4 Amoxicillin/Clavulanic Acid    S Ampicillin                     R Cefazolin                      R Cefepime                       R Ceftriaxone                    R Cefuroxime                     R Ciprofloxacin                  R Ertapenem                      S Gentamicin                     S Imipenem                       S Levofloxacin                   R Meropenem                      S Nitrofurantoin                 S Piperacillin/Tazobactam        S Tetracycline                   S Tobramycin                     S Trimethoprim/Sulfa             S   Urine Culture     Status: Abnormal   Collection Time: 07/31/23  3:48 PM   Specimen: Urine, Catheterized  Result Value Ref Range Status   Specimen Description   Final  URINE, CATHETERIZED Performed at [facility], [address] [place], [place] [address]    Special Requests   Final    NONE Performed at [facility], [address], [place], [place]  [address]    Culture (A)  Final    >=100,000 COLONIES/mL KLEBSIELLA OXYTOCA Confirmed Extended Spectrum Beta-Lactamase Producer (ESBL).  In bloodstream infections from ESBL organisms, carbapenems are preferred over piperacillin/tazobactam. They are shown to have a lower risk of mortality.    Report Status 08/02/2023 FINAL  Final   Organism ID, Bacteria KLEBSIELLA OXYTOCA (A)  Final      Susceptibility   Klebsiella oxytoca - MIC*    AMPICILLIN >=32 RESISTANT Resistant     CEFEPIME 8 INTERMEDIATE Intermediate     CEFTRIAXONE >=64 RESISTANT Resistant     CIPROFLOXACIN RESISTANT Resistant     GENTAMICIN <=1 SENSITIVE Sensitive     IMIPENEM <=0.25 SENSITIVE Sensitive     NITROFURANTOIN 64 INTERMEDIATE Intermediate     TRIMETH/SULFA >=320 RESISTANT Resistant     AMPICILLIN/SULBACTAM 8 SENSITIVE Sensitive     PIP/TAZO 8 SENSITIVE Sensitive ug/mL    * >=100,000 COLONIES/mL KLEBSIELLA OXYTOCA    [x]  Treated with Bactrim, organism resistant to prescribed antimicrobial  New antibiotic prescription: none at this time  43 yo M recently prescribed Bactrim for UTI, came back with leg and catheter tenderness. Also had complaints of burning with urination. ESBL Kleb oxytoca growing from 2/11 culture which is different than the ESBL E. Coli that grew in the culture on 1/29.  Recommendation: Contact the patient and request that he set up an appointment with Urology or come back to the ED within the next week to be seen and treated.  Would also recommend gentamicin pre-op before cystoscopy on 3/24. A staff message was sent to Dr. Alvester Morin to make him aware of the situation.  ED Provider: Glendora Score, MD  Enzo Bi, PharmD, BCPS, BCIDP Clinical Pharmacist 08/03/2023 9:47 AM

## 2023-08-04 DIAGNOSIS — G35 Multiple sclerosis: Secondary | ICD-10-CM | POA: Diagnosis not present

## 2023-08-05 DIAGNOSIS — G35 Multiple sclerosis: Secondary | ICD-10-CM | POA: Diagnosis not present

## 2023-08-07 DIAGNOSIS — G35 Multiple sclerosis: Secondary | ICD-10-CM | POA: Diagnosis not present

## 2023-08-08 ENCOUNTER — Telehealth: Payer: Self-pay | Admitting: Neurology

## 2023-08-08 ENCOUNTER — Ambulatory Visit: Payer: Medicaid Other | Admitting: Physical Therapy

## 2023-08-08 ENCOUNTER — Ambulatory Visit: Payer: Medicaid Other | Admitting: Occupational Therapy

## 2023-08-08 DIAGNOSIS — G35 Multiple sclerosis: Secondary | ICD-10-CM | POA: Diagnosis not present

## 2023-08-08 NOTE — Telephone Encounter (Signed)
Patient called on-call complaints of burning with his Foley catheter, advised him to contact urology

## 2023-08-09 DIAGNOSIS — G35 Multiple sclerosis: Secondary | ICD-10-CM | POA: Diagnosis not present

## 2023-08-09 DIAGNOSIS — R338 Other retention of urine: Secondary | ICD-10-CM | POA: Diagnosis not present

## 2023-08-09 LAB — COLOGUARD

## 2023-08-09 NOTE — Telephone Encounter (Signed)
 Noted

## 2023-08-10 ENCOUNTER — Other Ambulatory Visit: Payer: Self-pay | Admitting: Urology

## 2023-08-10 DIAGNOSIS — G35 Multiple sclerosis: Secondary | ICD-10-CM | POA: Diagnosis not present

## 2023-08-13 DIAGNOSIS — G35 Multiple sclerosis: Secondary | ICD-10-CM | POA: Diagnosis not present

## 2023-08-14 DIAGNOSIS — G35 Multiple sclerosis: Secondary | ICD-10-CM | POA: Diagnosis not present

## 2023-08-15 ENCOUNTER — Encounter: Payer: Self-pay | Admitting: Physical Therapy

## 2023-08-15 ENCOUNTER — Ambulatory Visit: Payer: Medicaid Other | Admitting: Physical Therapy

## 2023-08-15 ENCOUNTER — Ambulatory Visit: Payer: Medicaid Other | Admitting: Occupational Therapy

## 2023-08-15 DIAGNOSIS — R278 Other lack of coordination: Secondary | ICD-10-CM

## 2023-08-15 DIAGNOSIS — M6281 Muscle weakness (generalized): Secondary | ICD-10-CM

## 2023-08-15 DIAGNOSIS — R2689 Other abnormalities of gait and mobility: Secondary | ICD-10-CM

## 2023-08-15 DIAGNOSIS — R208 Other disturbances of skin sensation: Secondary | ICD-10-CM

## 2023-08-15 DIAGNOSIS — M62838 Other muscle spasm: Secondary | ICD-10-CM

## 2023-08-15 DIAGNOSIS — R296 Repeated falls: Secondary | ICD-10-CM

## 2023-08-15 DIAGNOSIS — R2681 Unsteadiness on feet: Secondary | ICD-10-CM

## 2023-08-15 DIAGNOSIS — G35 Multiple sclerosis: Secondary | ICD-10-CM | POA: Diagnosis not present

## 2023-08-15 NOTE — Patient Instructions (Signed)
  Coordination Activities  Perform the following activities for 15 minutes 1 times per day with BOTH hand(s).  Flip cards 1 at a time as fast as you can.  Deal cards with your thumb (Hold deck in hand and push card off top with thumb).  Pick up pennies one at a time and place in container or coin bank.  Pick up larger coins (nickels or quarters) and stack.  Practice writing name with Rt hand

## 2023-08-15 NOTE — Patient Instructions (Signed)
 Access Code: [email or web address] URL: [email or web address] Date: 08/15/2023 Prepared by: Camille Bal  Exercises - Sit to Stand with Counter Support  - 1 x daily - 5 x weekly - 3 sets - 6 reps - Side to Side Weight Shift with Counter Support  - 1 x daily - 7 x weekly - 1-2 sets - 10 reps - Forward Backward Weight Shift with Counter Support  - 1 x daily - 7 x weekly - 1-2 sets - 10 reps - Seated March  - 1 x daily - 7 x weekly - 3 sets - 10 reps - Supine Bridge  - 1 x daily - 5 x weekly - 2 sets - 10 reps - Hooklying Clamshell with Resistance  - 1 x daily - 5 x weekly - 2 sets - 10 reps

## 2023-08-15 NOTE — Therapy (Signed)
 OUTPATIENT PHYSICAL THERAPY NEURO TREATMENT   Patient Name: Bradley Brandt MRN: [id number] DOB:10/16/1980, 43 y.o., male Today's Date: 08/15/2023   PCP: Bradley Beers, FNP REFERRING PROVIDER: Asa Lente, MD  END OF SESSION:  PT End of Session - 08/15/23 0848     Visit Number 3    Number of Visits 9   8 + eval   Date for PT Re-Evaluation 09/28/23    Authorization Type [place] MEDICAID [employer]    PT Start Time 0845    PT Stop Time 0931    PT Time Calculation (min) 46 min    Equipment Utilized During Treatment Gait belt    Activity Tolerance Patient tolerated treatment well    Behavior During Therapy Flat affect             Past Medical History:  Diagnosis Date   Anxiety    Bipolar 1 disorder (HCC)    Bipolar disorder, unspecified (HCC) 01/03/2013   MS (multiple sclerosis) (HCC) dx 2006   relapsing-remitting right sided weakness   Neuromuscular disorder (HCC)    MS   Pneumonia    Right spastic hemiplegia (HCC)    Substance abuse (HCC)    Suprapubic catheter (HCC)    Past Surgical History:  Procedure Laterality Date   BOTOX INJECTION N/A 03/12/2023   Procedure: BOTOX INJECTION;  Surgeon: Crista Elliot, MD;  Location: [facility];  Service: Urology;  Laterality: N/A;   INSERTION OF SUPRAPUBIC CATHETER N/A 03/12/2023   Procedure: SUPRAPUBIC CATHETER EXCHANGE;  Surgeon: Crista Elliot, MD;  Location: [facility];  Service: Urology;  Laterality: N/A;   IR CATHETER TUBE CHANGE  10/31/2022   IR CATHETER TUBE CHANGE  11/02/2022   IR FLUORO GUIDED NEEDLE PLC ASPIRATION/INJECTION LOC  09/07/2022   TUMOR REMOVAL  1983   abdomen benign   Patient Active Problem List   Diagnosis Date Noted   Need for influenza vaccination 07/20/2023   Hospital discharge follow-up 04/20/2023   Malnutrition of moderate degree 04/11/2023   Acute pyelonephritis 04/03/2023   AKI (acute kidney injury) (HCC) 04/03/2023   Suspected UTI 02/06/2023   Urinary incontinence  02/06/2023   Suprapubic catheter (HCC) 01/31/2023   Marijuana user 01/17/2023   Tobacco use disorder, continuous 01/17/2023   Impaired mobility and ADLs 01/17/2023   UTI (urinary tract infection) 10/30/2022   Bipolar 1 disorder (HCC) 10/30/2022   Acute urinary retention 08/01/2022   Fever 08/01/2022   Rhabdomyolysis 07/30/2022   Multiple sclerosis exacerbation (HCC) 07/12/2022   Marijuana use 11/22/2020   History of marijuana use 11/22/2020   Chronic pain syndrome 11/21/2020   Encounter for examination following treatment at hospital 11/21/2020   Disorder of skeletal system 11/21/2020   Problems influencing health status 11/21/2020   MS (multiple sclerosis) (HCC)    Neuromuscular disorder (HCC)    Bipolar disorder in full remission (HCC) 11/16/2020   Other insomnia 08/11/2020   Generalized anxiety disorder 08/11/2020   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HCC) 12/31/2017   Optic neuritis 08/02/2017   High risk medication use 08/02/2017   Vitamin D deficiency 09/19/2016   Hypercholesterolemia 09/19/2016   Numbness 12/16/2015   Spastic gait 12/16/2015   Urinary hesitancy 12/16/2015   Low back pain with sciatica 09/28/2014   Polysubstance dependence (HCC) 02/09/2014   Combined drug dependence excluding opioids (HCC) 02/09/2014   Relapsing remitting multiple sclerosis (HCC) 05/28/2013   Elevated BP 01/06/2013   Bipolar disorder, unspecified (HCC) 01/03/2013   Bipolar disorder (HCC) 01/03/2013  Right hemiparesis (HCC) 01/02/2013   Multiple sclerosis flare 01/02/2013   Hemiplegia (HCC) 01/02/2013    ONSET DATE: 2001 (initial diagnosis)  REFERRING DIAG: G35 (ICD-10-CM) - Relapsing remitting multiple sclerosis (HCC) R26.9 (ICD-10-CM) - Gait disturbance  THERAPY DIAG:  Other lack of coordination  Unsteadiness on feet  Other disturbances of skin sensation  Muscle weakness (generalized)  Repeated falls  Other abnormalities of gait and mobility  Other muscle spasm  Rationale for  Evaluation and Treatment: Rehabilitation  SUBJECTIVE:                                                                                                                                                                                             SUBJECTIVE STATEMENT: Pt was trying to stand up and fell backwards, he was alone, he got himself up into his wheelchair.  He denies loss of consciousness or hitting head.  He denies pain or other acute changes. Pt accompanied by: family member - sister dropped him off  PERTINENT HISTORY: BPD 1, RRMS since 2001, polysubstance dependence, low back pain with sciatica, GAD, suprapubic catheter w/ recurrent UTI  From Neurologist notes: "diagnosed around 2001 when he presented with right-sided numbness and clumsiness and weakness"  "In late 2023/early 2024, he had missed several infusions and presented to the ED 07/11/2022 with an apparent rebound and an MRI showing multiple enbancing lesions in the brain.   There were multiple old spinal cord lesions and an enhancing new focus at T7.   He received 5 days of IV Solumedrol and was discharged.    He got his Tysabri infusion 07/26/2022 (looks like he had previous infusions 04/21/2022 and 03/25/2022)   We discussed Ocrevus or Kesimpta (as might improve compliance). Repeat imaging 07/31/2022 showed a new lest pontomedullary junction and new right centrum semiovale lesion compared to 07/12/2022.   MRI of the cervical and thoracic spine was unchanged.     We discussed the importance of compliance as the benefit will wear off after 2-3 months if doses are skipped.      He can take a few steps with a walker (was ablt to do 80100 feet before the exacerbation 06/2022).   Left foot drop has worsened and he now has trouble raising the left leg.  Balance is very poor and he cannot stand without strong bilateral support and cannot use a walker.[name]    He denies arm weakness or spasticity but is noted to have weakness in the left arm when tested    no numbness or tingling   Bladder function is tpoor and has an Foley cath.   He has had  increase bladder spasms the last week and family notes change in urine."  PAIN:  Are you having pain? No  PRECAUTIONS: Fall  RED FLAGS: Bowel or bladder incontinence: Yes: pt reports saddle tingling for 3 weeks and he has not spoken to Dr. Epimenio Foot about this    WEIGHT BEARING RESTRICTIONS: No  FALLS: Has patient fallen in last 6 months? Yes. Number of falls several - unsure how many, usually when walking  LIVING ENVIRONMENT: Lives with: lives alone Lives in: House/apartment Stairs: No Has following equipment at home: Dan Humphreys - 2 wheeled, Wheelchair (manual), shower chair, and bed side commode  PLOF: Requires assistive device for independence, Needs assistance with ADLs, Needs assistance with homemaking, Needs assistance with gait, Needs assistance with transfers, and supposed to have 80 hrs of personal care, but has not received this yet, dad says he will follow-up  PATIENT GOALS: to walk  OBJECTIVE:  Note: Objective measures were completed at Evaluation unless otherwise noted.  DIAGNOSTIC FINDINGS: see PMH section  COGNITION: Overall cognitive status: History of cognitive impairments - at baseline and pt reports difficulty with memory and speech, but declines referral to ST services at this time.   SENSATION: Light touch: WFL and pt reports duller sensation R > L  COORDINATION: LE RAMS:  slow and uncoordinated BLE Heel-to-shin:  dysmetric  EDEMA:  None significant in BLE  MUSCLE TONE: Pt unable to relax fully for assessment, possible left LE clonus  POSTURE: No Significant postural limitations  LOWER EXTREMITY ROM:     Active  Right Eval Left Eval  Hip flexion WNL in sitting  Hip extension   Hip abduction   Hip adduction   Hip internal rotation   Hip external rotation   Knee flexion   Knee extension   Ankle dorsiflexion   Ankle plantarflexion   Ankle inversion    Ankle  eversion     (Blank rows = not tested)  LOWER EXTREMITY MMT:    MMT Right Eval Left Eval  Hip flexion 4 3  Hip extension    Hip abduction 4+ 4+  Hip adduction    Hip internal rotation    Hip external rotation    Knee flexion 4+ 4+  Knee extension 4+ 4  Ankle dorsiflexion 3+ 4-  Ankle plantarflexion    Ankle inversion    Ankle eversion    (Blank rows = not tested)  BED MOBILITY:  Pt uses features of hospital bed for independence at home  TRANSFERS: Assistive device utilized: Environmental consultant - 2 wheeled  Sit to stand: Mod A Stand to sit: Mod A Chair to chair: Mod A Floor: Total A  GAIT: Gait pattern: decreased step length- Right, decreased step length- Left, decreased stride length, decreased hip/knee flexion- Right, decreased hip/knee flexion- Left, decreased ankle dorsiflexion- Right, decreased ankle dorsiflexion- Left, ataxic, decreased trunk rotation, trunk flexed, narrow BOS, poor foot clearance- Right, and poor foot clearance- Left Distance walked: 8 ft Assistive device utilized: Walker - 2 wheeled and +2 w/c follow Level of assistance: Min A Comments: Pt catheter bag falls to floor at end of distance w/ pt attempting to reach and pick it up, PT discouraged this due to far distance of walker placement.  He loses his footing/staggers a few times during distance resulting in lifting walker from floor.   FUNCTIONAL TESTS:  None completed due to possible pt tone and ataxia.  PATIENT SURVEYS:  None completed due to time.  TREATMENT DATE: 08/15/2023  Sister presents following subjective:  she states he wants to walk and needs to.  She has concerns that his medications have been enabling him and every facility and provider up to this point has been prioritizing him sitting in a chair on heavy medications.  Pt reports being confused why this is.  PT provides  insight to deficits:  concern for need for more supervision in home due to decreased safety awareness and repeated falls, process of MS and benefits of consistent medication regimen/medical follow-ups, and fall risk contributing to likely seated/supine mobility previously.  Pt verbalizes understanding.  Sister leaves session to run errands. Pt transfers stand step minA w/ cues to advance RLE CGA to right using 2WW. Supine bridges 3x12 Side-lying hip abduction x15 left side-lying, pt did not want to lay on right side due to fixed catheter line and feeling the pressure will hurt too much Supine hip abduction w/ red theraband 2x15 Re-printed and verbally reviewed HEP - pt states he has not been working on this because he forgot he had them. GAIT: Gait pattern: step to pattern, step through pattern, decreased step length- Right, decreased step length- Left, decreased stride length, decreased hip/knee flexion- Right, decreased hip/knee flexion- Left, decreased ankle dorsiflexion- Right, decreased ankle dorsiflexion- Left, Left steppage, ataxic, decreased trunk rotation, trunk flexed, narrow BOS, poor foot clearance- Right, and poor foot clearance- Left Distance walked: 90 ft Assistive device utilized: Walker - 2 wheeled and +2 wheelchair follow Level of assistance: Min A Comments: PT providing max cuing for spacing of BOS and foot placement, approximation to AD.  PT maintaining contact of 2WW to ground especially during turning.  PATIENT EDUCATION: Education details:  Following up with MS MD regarding feeling the meds "enable" him and requesting medication review if desired.  2WW safety.  Brake management of wheelchair.  Compliance with HEP to see progress with activity tolerance, strength, and balance - he may benefit from supervision and prompting to initiate which he does not currently have, but will communicate with sister about checking in for this. Person educated: Patient and sister Education  method: Explanation Education comprehension: verbalized understanding and needs further education  HOME EXERCISE PROGRAM: Access Code: [email or web address] URL: [email or web address] Date: 08/15/2023 Prepared by: Camille Bal  Exercises - Sit to Stand with Counter Support  - 1 x daily - 5 x weekly - 3 sets - 6 reps - Side to Side Weight Shift with Counter Support  - 1 x daily - 7 x weekly - 1-2 sets - 10 reps - Forward Backward Weight Shift with Counter Support  - 1 x daily - 7 x weekly - 1-2 sets - 10 reps - Seated March  - 1 x daily - 7 x weekly - 3 sets - 10 reps - Supine Bridge  - 1 x daily - 5 x weekly - 2 sets - 10 reps - Hooklying Clamshell with Resistance  - 1 x daily - 5 x weekly - 2 sets - 10 reps  GOALS: Goals reviewed with patient? Yes  SHORT TERM GOALS: Target date: 08/24/2023  Pt will be no more than supervision level and compliant with initial standing strength and balance focused HEP in order to maintain functional progress and improve mobility. Baseline:  To be established. Goal status: INITIAL  2.  Pt will ambulate >/=50 feet with LRAD and no more than supervision level of assist to promote household and community access. Baseline: 8 ft CGA w/ +2 w/c follow Goal status: INITIAL  3.  PT to assess need for bracing with ambulation/gait fatigue in relation to foot-drop and request order to obtain as appropriate. Baseline: Will further assess. Goal status: INITIAL  4.  Pt will decrease 5xSTS to </=41.03 seconds with improved form in order to demonstrate decreased risk for falls and improved functional bilateral LE strength and power. Baseline:  46.03 sec minA w/ max cuing for safety (2/12) Goal status: INITIAL  LONG TERM GOALS: Target date: 09/21/2023  Pt will be no more than supervision level and compliant with finalized standing strength and balance focused HEP in order to maintain functional progress and improve mobility. Baseline: To be established. Goal  status: INITIAL  2.  Pt will ambulate >/=150 feet with LRAD and no more than supervision level of assist to promote household and community access. Baseline: 8 ft CGA w/ +2 w/c follow Goal status: INITIAL  3.  Pt will decrease 5xSTS to </=36.03 seconds with improved form in order to demonstrate decreased risk for falls and improved functional bilateral LE strength and power. Baseline: 46.03 sec minA w/ max cuing for safety (2/12) Goal status: INITIAL  4.  Pt will perform floor recovery with no more than modA in order to improve mobility in the home in the event of a fall. Baseline: Sister usually lifting him TotalA into chair when he falls. Goal status: INITIAL  ASSESSMENT:  CLINICAL IMPRESSION: Focus of skilled session today on addressing ongoing needs for LE strengthening by adding safe and independent tasks to HEP.  Patient's sister briefly in session today with concerning that medications are enabling the patient's need for wheelchair.  PT attempted to provide insight to patient deficits and belief that pt needs 24/7 supervision at home at current.  Patient encouraged to seek counselor from MD and supporting medical team regarding medication management and review if concerns linger.  PT does have concerns that this could be a barrier to ongoing care.  Further practiced walking this visit w/ pt having severe ataxia and needing 2WW management.  His deficits remain profound and he continues to benefit from skilled PT in this setting to improve safety awareness and promote carryover to home environment as able.  OBJECTIVE IMPAIRMENTS: Abnormal gait, decreased activity tolerance, decreased balance, decreased cognition, decreased coordination, decreased endurance, decreased knowledge of condition, decreased knowledge of use of DME, decreased mobility, difficulty walking, decreased ROM, decreased strength, decreased safety awareness, increased muscle spasms, impaired sensation, impaired tone, and  improper body mechanics.   ACTIVITY LIMITATIONS: carrying, lifting, bending, standing, squatting, stairs, transfers, bathing, toileting, dressing, reach over head, hygiene/grooming, and locomotion level  PARTICIPATION LIMITATIONS: meal prep, cleaning, laundry, medication management, driving, shopping, community activity, occupation, and yard work  PERSONAL FACTORS: Age, Behavior pattern, Fitness, Past/current experiences, Social background, Time since onset of injury/illness/exacerbation, and 1-2 comorbidities: recurrent UTI and chronic LBP  are also affecting patient's functional outcome.   REHAB POTENTIAL: Fair noncompliance to infusion regimen, multiple lesion sites and recent relapse, length of time without rehab prior  CLINICAL DECISION MAKING: Evolving/moderate complexity  EVALUATION COMPLEXITY: Moderate  PLAN:  PT FREQUENCY: 1x/week (conserving visits for OT)   PT DURATION: 8 weeks  PLANNED INTERVENTIONS: 97164- PT Re-evaluation, 97110-Therapeutic exercises, 97530- Therapeutic activity, 97112- Neuromuscular re-education, 97535- Self Care, 16109- Manual therapy, [phone]- Gait training, [phone]- Orthotic Fit/training, [phone]- Electrical stimulation (manual), Patient/Family education, Balance training, Stair training, Vestibular training, DME instructions, and Wheelchair mobility training  PLAN FOR NEXT SESSION:   Gait training w/ +2 w/c follow - foot drop/bracing? - unsure (  consider ongoing cognition/caregiving)  Add supine/side-lying strengthening to HEP.  Standing balance and strength.  Floor recovery - +2? - part whole (tall kneel > half kneel using pull-to-stand?) - pt lives alone and needs to be independent/modI when possible.  Sister typically providing transportation and assistance at home as she lives next door!  Check all possible CPT codes: See Planned Interventions List for Planned CPT Codes    Check all conditions that are expected to impact treatment: Cognitive Impairment or  Intellectual disability, Contractures, spasticity or fracture relevant to requested treatment, Neurological condition and/or seizures, and Social determinants of health   If treatment provided at initial evaluation, no treatment charged due to lack of authorization.    Sadie Haber, PT, DPT 08/15/2023, 1:06 PM

## 2023-08-15 NOTE — Therapy (Signed)
 OUTPATIENT OCCUPATIONAL THERAPY NEURO TREATMENT  Patient Name: Bradley Brandt MRN: [id number] DOB:[birth year], 43 y.o., male Today's Date: 08/15/2023  PCP: Edwin Dada, Elvera Lennox, FNP REFERRING PROVIDER: Asa Lente, MD  END OF SESSION:  OT End of Session - 08/15/23 0941     Visit Number 2    Number of Visits 9    Date for OT Re-Evaluation 09/29/23    Authorization Type [employer] MCD    OT Start Time 0935    OT Stop Time 1015    OT Time Calculation (min) 40 min    Activity Tolerance Patient tolerated treatment well    Behavior During Therapy Flat affect             Past Medical History:  Diagnosis Date   Anxiety    Bipolar 1 disorder (HCC)    Bipolar disorder, unspecified (HCC) 01/03/2013   MS (multiple sclerosis) (HCC) dx 2006   relapsing-remitting right sided weakness   Neuromuscular disorder (HCC)    MS   Pneumonia    Right spastic hemiplegia (HCC)    Substance abuse (HCC)    Suprapubic catheter [facility])    Past Surgical History:  Procedure Laterality Date   BOTOX INJECTION N/A 03/12/2023   Procedure: BOTOX INJECTION;  Surgeon: Crista Elliot, MD;  Location: [facility];  Service: Urology;  Laterality: N/A;   INSERTION OF SUPRAPUBIC CATHETER N/A 03/12/2023   Procedure: SUPRAPUBIC CATHETER EXCHANGE;  Surgeon: Crista Elliot, MD;  Location: [facility];  Service: Urology;  Laterality: N/A;   IR CATHETER TUBE CHANGE  10/31/2022   IR CATHETER TUBE CHANGE  11/02/2022   IR FLUORO GUIDED NEEDLE PLC ASPIRATION/INJECTION LOC  09/07/2022   TUMOR REMOVAL  1983   abdomen benign   Patient Active Problem List   Diagnosis Date Noted   Need for influenza vaccination 07/20/2023   Hospital discharge follow-up 04/20/2023   Malnutrition of moderate degree 04/11/2023   Acute pyelonephritis 04/03/2023   AKI (acute kidney injury) (HCC) 04/03/2023   Suspected UTI 02/06/2023   Urinary incontinence 02/06/2023   Suprapubic catheter (HCC) 01/31/2023   Marijuana user 01/17/2023   Tobacco  use disorder, continuous 01/17/2023   Impaired mobility and ADLs 01/17/2023   UTI (urinary tract infection) 10/30/2022   Bipolar 1 disorder (HCC) 10/30/2022   Acute urinary retention 08/01/2022   Fever 08/01/2022   Rhabdomyolysis 07/30/2022   Multiple sclerosis exacerbation (HCC) 07/12/2022   Marijuana use 11/22/2020   History of marijuana use 11/22/2020   Chronic pain syndrome 11/21/2020   Encounter for examination following treatment at hospital 11/21/2020   Disorder of skeletal system 11/21/2020   Problems influencing health status 11/21/2020   MS (multiple sclerosis) (HCC)    Neuromuscular disorder (HCC)    Bipolar disorder in full remission (HCC) 11/16/2020   Other insomnia 08/11/2020   Generalized anxiety disorder 08/11/2020   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HCC) 12/31/2017   Optic neuritis 08/02/2017   High risk medication use 08/02/2017   Vitamin D deficiency 09/19/2016   Hypercholesterolemia 09/19/2016   Numbness 12/16/2015   Spastic gait 12/16/2015   Urinary hesitancy 12/16/2015   Low back pain with sciatica 09/28/2014   Polysubstance dependence (HCC) 02/09/2014   Combined drug dependence excluding opioids (HCC) 02/09/2014   Relapsing remitting multiple sclerosis (HCC) 05/28/2013   Elevated BP 01/06/2013   Bipolar disorder, unspecified (HCC) 01/03/2013   Bipolar disorder (HCC) 01/03/2013   Right hemiparesis (HCC) 01/02/2013   Multiple sclerosis flare 01/02/2013   Hemiplegia (HCC) 01/02/2013  ONSET DATE: 07/26/2023 (referral date)   REFERRING DIAG:  G35 (ICD-10-CM) - MS (multiple sclerosis) (HCC)  Z74.09,Z78.9 (ICD-10-CM) - Impaired mobility and ADLs    THERAPY DIAG:  Other lack of coordination  Unsteadiness on feet  Other disturbances of skin sensation  Muscle weakness (generalized)  Rationale for Evaluation and Treatment: Rehabilitation  SUBJECTIVE:   SUBJECTIVE STATEMENT: I had a fall yesterday. I wear a diaper for BM and have a foley catheter Pt  accompanied by: self (father dropped him off at clinic)   PERTINENT HISTORY: MS diagnosed 2006 (pt was imprisoned when receiving diagnosis - but he received medical care/infusions for MS and was walking fully when released from prison)  Pt w/ increased decline when he missed several infusions in late 2023/2024 per MD note. PMH: Bipolar1 d/o, anxiety, substance abuse  PRECAUTIONS: Fall and Other: foley catheter, no driving  WEIGHT BEARING RESTRICTIONS: No  PAIN:  Are you having pain?  Only pain at catheter site  FALLS: Has patient fallen in last 6 months? No per pt report, but pt reports multiple falls  LIVING ENVIRONMENT: Lives with: lives alone Lives in: 1 story house w/ ramp to enter Has following equipment at home: Dan Humphreys - 2 wheeled, Wheelchair (power), Wheelchair (manual), Shower bench, bed side commode, and Grab bars  PLOF: Independent with basic ADLs  PATIENT GOALS: to walk  OBJECTIVE:  Note: Objective measures were completed at Evaluation unless otherwise noted.  HAND DOMINANCE: Right  ADLs: Overall ADLs: mod I for BADLS per pt report - however poor historian and pt later reports wearing diaper for BM, and aide M-F for 4 hrs Transfers/ambulation related to ADLs: uses w/c, can transfer himself  Eating: mod I  Grooming: mod I  UB Dressing: mod I  LB Dressing: mod I per pt report but ?  Toileting: mod I per pt report for BM - has foley catheter (08/15/23: found out he is not doing this, wears adult diaper and sister comes over to help clean him)  Bathing: mod I  Tub Shower transfers: mod I walk in shower however question safety with this, (pt later reports aide assist with showering and transfers) Equipment: Shower seat with back and Grab bars  IADLs: Shopping: sister or girlfriend gets groceries Light housekeeping: pt washes dishes, girlfriend does laundry and vacuums Meal Prep: pt heats things up in microwave, girlfriend or sister does cooking Community mobility:  sister/family usually brings her Medication management: girlfriend assist patient Financial management: automatic withdrawl Handwriting:  approx 70% legibility for signature, 90% in print (vision also affected)   MOBILITY STATUS: pt uses w/c but transfers himself - P.T. reports he is unsafe with transfers and needs supervision d/t multiple falls, forgetting to lock w/c, etc   UPPER EXTREMITY ROM:  BUE AROM WNL's however w/ ataxia and dystonia in movements Lt hand small finger with PIP hyperextension and finger abduction when attempting full extension d/t movement pattern   UPPER EXTREMITY MMT:   grossly 5/5 BUE's, however endurance may be affected if done multiple times   HAND FUNCTION: Grip strength: Right: 74 lbs; Left: 80 lbs  COORDINATION: 9 Hole Peg test: Right: 69.48 sec; Left: 82.01 sec Ataxia noted  SENSATION: Appears WFL's for light touch but inconsistent on location  EDEMA: none observed  MUSCLE TONE: dystonias  COGNITION: Overall cognitive status:  difficult to assess, pt also with dysarthria, but can follow simple 1-2 step commands. Appears to have decreased safety awareness/cognitive deficits and question accuracy of above report with ADLS but  no family back in clinic to confirm   VISION: Subjective report: I can't see without my glasses Baseline vision: Wears glasses all the time Visual history:  none. However does report diplopia with MS  VISION ASSESSMENT: To be further assessed in functional context  Patient has difficulty with following activities due to following visual impairments: none reported  PERCEPTION: Not tested  PRAXIS: Not tested  OBSERVATIONS: Dysarthria, ataxic movements and decreased coordination, dystonia, uses w/c for mobility, unsafe with transfers, decreased safety awareness                                                                                                                             TREATMENT DATE:  08/15/23  Pt  asked more details about status of ADLs w/ conflicting reports of pt's ability and who helps him with what tasks. He reports today that an aide comes 4 hrs/day M-F and helps with showering, cleaning and cooking.   This therapist concerned about him living alone given pt's decreased ability to safely transfer and perform his BADLS. Pt needs 24 hr supervision. Pt at risk for falls d/t physical and cognitive limitations.   Pt issued coordination HEP - see pt instructions for details.  Pt became adamant that he cannot write but was able to write name in print at evaluation and wrote first name today    PATIENT EDUCATION: Education details: coordination HEP  Person educated: Patient Education method: Explanation, Demonstration, Verbal cues, and Handouts Education comprehension: verbalized understanding, returned demonstration, verbal cues required, and needs further education  HOME EXERCISE PROGRAM: 08/15/23: coordination HEP    GOALS: Goals reviewed with patient? Yes  SHORT TERM GOALS: Target date: 08/29/23  Independent with HEP for bilateral coordination  Baseline: Goal status: IN PROGRESS  2.  Pt to improve coordination by 5 sec bilaterally on 9 hole peg test Baseline: Rt = 69 sec, Lt = 82 sec Goal status: INITIAL  3.  Pt to demo safety with functional transfer to BSC/toilet w/ no more than min cues Baseline: unsafe, cues to lock w/c  Goal status: INITIAL  4.  Pt to sign name with 90% legibility Baseline: approx 70% Goal status: INITIAL   LONG TERM GOALS: Target date: 09/29/23  Pt to stand at countertop for 5 min for functional task with one hand support prn Baseline:  Goal status: INITIAL  2.  Pt to improve coordination bilaterally by 10 sec on 9 hole peg test Baseline: Rt = 69 sec, Lt = 82 sec Goal status: INITIAL  3.  Pt to demo making sandwich mod I safely from w/c level prn, standing only to retrieve plate from higher shelf Baseline:  Goal status:  INITIAL   ASSESSMENT:  CLINICAL IMPRESSION: Patient seen today for first O.T. treatment for relapsing remitting MS. Hx includes bipolar d/o, anxiety. Decreased ability to perform BADLS than originally reported at evaluation. Patient currently presents below baseline level of functioning demonstrating functional deficits and impairments as noted  below. Pt would benefit from skilled OT services in the outpatient setting to work on impairments as noted below to help pt return to PLOF as able.   [name]   PERFORMANCE DEFICITS: in functional skills including ADLs, IADLs, coordination, sensation, strength, Fine motor control, Gross motor control, mobility, balance, endurance, decreased knowledge of precautions, and UE functional use, cognitive skills including problem solving and safety awareness, and psychosocial skills including coping strategies.   IMPAIRMENTS: are limiting patient from ADLs and IADLs.   CO-MORBIDITIES: may have co-morbidities  that affects occupational performance. Patient will benefit from skilled OT to address above impairments and improve overall function.  MODIFICATION OR ASSISTANCE TO COMPLETE EVALUATION: No modification of tasks or assist necessary to complete an evaluation.  OT OCCUPATIONAL PROFILE AND HISTORY: Detailed assessment: Review of records and additional review of physical, cognitive, psychosocial history related to current functional performance.  CLINICAL DECISION MAKING: Moderate - several treatment options, min-mod task modification necessary  REHAB POTENTIAL: Fair time since decline, ? Family/social support  EVALUATION COMPLEXITY: Moderate    PLAN:  OT FREQUENCY: 1x/week  OT DURATION: 8 weeks (Plus eval)   PLANNED INTERVENTIONS: 97535 self care/ADL training, 60454 therapeutic exercise, 97530 therapeutic activity, 97112 neuromuscular re-education, 97140 manual therapy, passive range of motion, functional mobility training, energy conservation, coping  strategies training, patient/family education, and DME and/or AE instructions  RECOMMENDED OTHER SERVICES: none at this time  CONSULTED AND AGREED WITH PLAN OF CARE: Patient  PLAN FOR NEXT SESSION: transfers (possibly practice transfer to [facility] or toilet in prep for using at home), safety with ADLs, standing at countertop, continue coordination   Sheran Lawless, OT 08/15/2023, 9:41 AM

## 2023-08-16 DIAGNOSIS — G35 Multiple sclerosis: Secondary | ICD-10-CM | POA: Diagnosis not present

## 2023-08-17 DIAGNOSIS — G35 Multiple sclerosis: Secondary | ICD-10-CM | POA: Diagnosis not present

## 2023-08-20 ENCOUNTER — Ambulatory Visit (INDEPENDENT_AMBULATORY_CARE_PROVIDER_SITE_OTHER): Payer: Medicaid Other

## 2023-08-20 VITALS — BP 101/68 | HR 58 | Temp 98.0°F | Resp 20 | Ht 71.0 in | Wt 180.0 lb

## 2023-08-20 DIAGNOSIS — G35 Multiple sclerosis: Secondary | ICD-10-CM | POA: Diagnosis not present

## 2023-08-20 MED ORDER — ACETAMINOPHEN 325 MG PO TABS
650.0000 mg | ORAL_TABLET | Freq: Once | ORAL | Status: AC
  Administered 2023-08-20: 650 mg via ORAL
  Filled 2023-08-20: qty 2

## 2023-08-20 MED ORDER — SODIUM CHLORIDE 0.9 % IV SOLN
300.0000 mg | Freq: Once | INTRAVENOUS | Status: AC
  Administered 2023-08-20: 300 mg via INTRAVENOUS
  Filled 2023-08-20: qty 15

## 2023-08-20 MED ORDER — SODIUM CHLORIDE 0.9 % IV SOLN: INTRAVENOUS | Status: DC

## 2023-08-20 MED ORDER — LORATADINE 10 MG PO TABS
10.0000 mg | ORAL_TABLET | Freq: Once | ORAL | Status: AC
  Administered 2023-08-20: 10 mg via ORAL
  Filled 2023-08-20: qty 1

## 2023-08-20 NOTE — Progress Notes (Signed)
 Diagnosis: Multiple Sclerosis  Provider:  Chilton Greathouse MD  Procedure: IV Infusion  IV Type: Peripheral, IV Location: L Hand  Tysabri (Natalizumab), Dose: 300 mg  Infusion Start Time: 0935  Infusion Stop Time: 1040  Post Infusion IV Care: Patient declined observation and Peripheral IV Discontinued  Discharge: Condition: Good, Destination: Home . AVS Provided  Performed by:  Nat Math, RN

## 2023-08-21 ENCOUNTER — Other Ambulatory Visit: Payer: Self-pay

## 2023-08-21 DIAGNOSIS — G35 Multiple sclerosis: Secondary | ICD-10-CM

## 2023-08-21 DIAGNOSIS — R32 Unspecified urinary incontinence: Secondary | ICD-10-CM

## 2023-08-21 NOTE — Assessment & Plan Note (Signed)
 Uses incontinence pads and Pads at home  Will refill supply when due  Patient encouraged to maintain  close follow up with urology

## 2023-08-22 ENCOUNTER — Ambulatory Visit: Payer: Medicaid Other | Admitting: Physical Therapy

## 2023-08-22 ENCOUNTER — Ambulatory Visit: Payer: Medicaid Other | Admitting: Occupational Therapy

## 2023-08-22 DIAGNOSIS — G35 Multiple sclerosis: Secondary | ICD-10-CM | POA: Diagnosis not present

## 2023-08-23 DIAGNOSIS — G35 Multiple sclerosis: Secondary | ICD-10-CM | POA: Diagnosis not present

## 2023-08-24 DIAGNOSIS — M6281 Muscle weakness (generalized): Secondary | ICD-10-CM | POA: Diagnosis not present

## 2023-08-24 DIAGNOSIS — M6282 Rhabdomyolysis: Secondary | ICD-10-CM | POA: Diagnosis not present

## 2023-08-24 DIAGNOSIS — G35 Multiple sclerosis: Secondary | ICD-10-CM | POA: Diagnosis not present

## 2023-08-24 NOTE — Progress Notes (Addendum)
 COVID Vaccine received:  []  No [x]  Yes Date of any COVID positive Test in last 90 days:  PCP - Edwin Dada FNP Cardiologist - none Neurology-  Shon Millet, DO Dr. Despina Arias   Chest x-ray - [id number]  Epic EKG -  10-30-22  Epic  Stress Test -  ECHO -  Cardiac Cath -   PCR screen: []  Ordered & Completed []   No Order but Needs PROFEND     [x]   N/A for this surgery  Surgery Plan:  [x]  Ambulatory   []  Outpatient in bed  []  Admit Anesthesia:    []  General  []  Spinal  [x]   Choice []   MAC  Bowel Prep - [x]  No  []   Yes ______  Pacemaker / ICD device [x]  No []  Yes   Spinal Cord Stimulator:[x]  No []  Yes       History of Sleep Apnea? [x]  No []  Yes   CPAP used?- [x]  No []  Yes    Does the patient monitor blood sugar?   [x]  N/A   []  No []  Yes  Patient has: [x]  NO Hx DM   []  Pre-DM   []  DM1  []   DM2  Blood Thinner / Instructions:  None Aspirin Instructions:  none  ERAS Protocol Ordered: [x]  No  []  Yes Patient is to be NPO after: midnight prior  Dental hx: []  Dentures:  []  N/A      []  Bridge or Partial:                   [x]  Loose or Damaged teeth:   Comments: Patient states that he does not have a POA and he is able to make his own decisions about his healthcare.    Activity level: Patient is unable to climb a flight of stairs without difficulty due to multiple sclerosis.  Patient can not perform ADLs without assistance, he has family members and [employer] workers who help him.   Anesthesia review: Has MS, smoker, Bipolar, GAD, Right hemiplegia, CPS, substance abuse, anemia   Patient denies shortness of breath, fever, cough and chest pain at PAT appointment.  Patient verbalized understanding and agreement to the Pre-Surgical Instructions that were given to them at this PAT appointment. Patient was also educated of the need to review these PAT instructions again prior to his surgery.I reviewed the appropriate phone numbers to call if they have any and questions or concerns.

## 2023-08-24 NOTE — Patient Instructions (Addendum)
 SURGICAL WAITING ROOM VISITATION Patients having surgery or a procedure may have no more than 2 support people in the waiting area - these visitors may rotate in the visitor waiting room.   Due to an increase in RSV and influenza rates and associated hospitalizations, children ages 65 and under may not visit patients in [facility] hospitals. If the patient needs to stay at the hospital during part of their recovery, the visitor guidelines for inpatient rooms apply.   PRE-OP VISITATION  Pre-op nurse will coordinate an appropriate time for 1 support person to accompany the patient in pre-op.  This support person may not rotate.  This visitor will be contacted when the time is appropriate for the visitor to come back in the pre-op area.   Please refer to the [facility] website for the visitor guidelines for Inpatients (after your surgery is over and you are in a regular room).   You are not required to quarantine at this time prior to your surgery. However, you must do this: Hand Hygiene often Do NOT share personal items Notify your provider if you are in close contact with someone who has COVID or you develop fever 100.4 or greater, new onset of sneezing, cough, sore throat, shortness of breath or body aches.  If you test positive for Covid or have been in contact with anyone that has tested positive in the last 10 days please notify you surgeon.     Your procedure is scheduled on:  Monday  September 10, 2023   Report to [facility] Main Entrance: [address] entrance where the [employer] is available.    Report to admitting at: 05:15 AM   Call this number if you have any questions or problems the morning of surgery [phone]   DO NOT EAT OR DRINK ANYTHING AFTER MIDNIGHT THE NIGHT PRIOR TO YOUR SURGERY / PROCEDURE.    FOLLOW BOWEL PREP AND ANY ADDITIONAL PRE OP INSTRUCTIONS YOU RECEIVED FROM YOUR SURGEON'S OFFICE!!!    Oral Hygiene is also important to reduce your risk of  infection.        Remember - BRUSH YOUR TEETH THE MORNING OF SURGERY WITH YOUR REGULAR TOOTHPASTE   Do NOT smoke after Midnight the night before surgery.     STOP TAKING all Vitamins, Herbs and supplements 1 week before your surgery.    Take ONLY these medicines the morning of surgery with A SIP OF WATER:  none                   You may not have any metal on your body including jewelry, and body piercing   Do not wear , lotions, powders, cologne, or deodorant   Men may shave face and neck.   Contacts, Hearing Aids, dentures or bridgework may not be worn into surgery. DENTURES WILL BE REMOVED PRIOR TO SURGERY PLEASE DO NOT APPLY "Poly grip" OR ADHESIVES!!!   Patients discharged on the day of surgery will not be allowed to drive home.  Someone NEEDS to stay with you for the first 24 hours after anesthesia.   Do not bring your home medications to the hospital. The Pharmacy will dispense medications listed on your medication list to you during your admission in the Hospital.     Please read over the following fact sheets you were given: IF YOU HAVE QUESTIONS ABOUT YOUR PRE-OP INSTRUCTIONS, PLEASE CALL [phone].     [place] - Preparing for Surgery Before surgery, you can play an  important role.  Because skin is not sterile, your skin needs to be as free of germs as possible.  You can reduce the number of germs on your skin by washing with CHG (chlorahexidine gluconate) soap before surgery.  CHG is an antiseptic cleaner which kills germs and bonds with the skin to continue killing germs even after washing. Please DO NOT use if you have an allergy to CHG or antibacterial soaps.  If your skin becomes reddened/irritated stop using the CHG and inform your nurse when you arrive at Short Stay. Do not shave (including legs and underarms) for at least 48 hours prior to the first CHG shower.  You may shave your face/neck.   Please follow these instructions carefully:             1.  Shower  with CHG Soap the night before surgery and the  morning of surgery.             2.  If you choose to wash your hair, wash your hair first as usual with your normal  shampoo.             3.  After you shampoo, rinse your hair and body thoroughly to remove the shampoo.                                           4.  Use CHG as you would any other liquid soap.  You can apply chg directly to the skin and wash.  Gently with a scrungie or clean washcloth.             5.  Apply the CHG Soap to your body ONLY FROM THE NECK DOWN.   Do not use on face/ open                           Wound or open sores. Avoid contact with eyes, ears mouth and genitals (private parts).                       Wash face,  Genitals (private parts) with your normal soap.             6.  Wash thoroughly, paying special attention to the area where your             surgery  will be performed.             7.  Thoroughly rinse your body with warm water from the neck down.             8.  DO NOT shower/wash with your normal soap after using and rinsing off the CHG Soap.            9.  Pat yourself dry with a clean towel.            10.  Wear clean pajamas.            11.  Place clean sheets on your bed the night of your first shower and do not  sleep with pets.   ON THE DAY OF SURGERY : Do not apply any lotions/deodorants the morning of surgery.  Please wear clean clothes to the hospital/surgery center.       FAILURE TO FOLLOW THESE INSTRUCTIONS MAY RESULT IN THE CANCELLATION OF YOUR SURGERY  PATIENT SIGNATURE_________________________________   NURSE SIGNATURE__________________________________   ________________________________________________________________________

## 2023-08-27 ENCOUNTER — Encounter (HOSPITAL_COMMUNITY): Payer: Self-pay

## 2023-08-27 ENCOUNTER — Other Ambulatory Visit: Payer: Self-pay

## 2023-08-27 ENCOUNTER — Encounter (HOSPITAL_COMMUNITY)
Admission: RE | Admit: 2023-08-27 | Discharge: 2023-08-27 | Disposition: A | Discharge status: Home / Self Care | Payer: Medicaid Other | Source: Ambulatory Visit | Attending: Urology | Admitting: Urology

## 2023-08-27 VITALS — BP 128/80 | HR 86 | Temp 98.2°F | Resp 16 | Ht 71.0 in | Wt 179.9 lb

## 2023-08-27 DIAGNOSIS — G35 Multiple sclerosis: Secondary | ICD-10-CM | POA: Diagnosis not present

## 2023-08-27 DIAGNOSIS — Z01812 Encounter for preprocedural laboratory examination: Secondary | ICD-10-CM | POA: Insufficient documentation

## 2023-08-27 DIAGNOSIS — Z79899 Other long term (current) drug therapy: Secondary | ICD-10-CM | POA: Insufficient documentation

## 2023-08-27 DIAGNOSIS — Z01818 Encounter for other preprocedural examination: Secondary | ICD-10-CM

## 2023-08-27 DIAGNOSIS — Z09 Encounter for follow-up examination after completed treatment for conditions other than malignant neoplasm: Secondary | ICD-10-CM

## 2023-08-27 HISTORY — DX: Anemia, unspecified: D64.9

## 2023-08-27 LAB — COMPREHENSIVE METABOLIC PANEL
ALT: 14 U/L (ref 0–44)
AST: 16 U/L (ref 15–41)
Albumin: 3.6 g/dL (ref 3.5–5.0)
Alkaline Phosphatase: 63 U/L (ref 38–126)
Anion gap: 8 (ref 5–15)
BUN: 8 mg/dL (ref 6–20)
CO2: 27 mmol/L (ref 22–32)
Calcium: 9.3 mg/dL (ref 8.9–10.3)
Chloride: 105 mmol/L (ref 98–111)
Creatinine, Ser: 1.02 mg/dL (ref 0.61–1.24)
GFR, Estimated: >60 mL/min (ref >60)
Glucose, Bld: 97 mg/dL (ref 70–99)
Potassium: 4 mmol/L (ref 3.5–5.1)
Sodium: 140 mmol/L (ref 135–145)
Total Bilirubin: 0.6 mg/dL (ref 0.0–1.2)
Total Protein: 7.2 g/dL (ref 6.5–8.1)

## 2023-08-27 LAB — CBC
HCT: 38.3 % — ABNORMAL LOW (ref 39.0–52.0)
Hemoglobin: 12.3 g/dL — ABNORMAL LOW (ref 13.0–17.0)
MCH: 29.6 pg (ref 26.0–34.0)
MCHC: 32.1 g/dL (ref 30.0–36.0)
MCV: 92.1 fL (ref 80.0–100.0)
Platelets: 241 10*3/uL (ref 150–400)
RBC: 4.16 MIL/uL — ABNORMAL LOW (ref 4.22–5.81)
RDW: 17.2 % — ABNORMAL HIGH (ref 11.5–15.5)
WBC: 6.4 10*3/uL (ref 4.0–10.5)
nRBC: 1.1 % — ABNORMAL HIGH (ref 0.0–0.2)

## 2023-08-28 DIAGNOSIS — G35 Multiple sclerosis: Secondary | ICD-10-CM | POA: Diagnosis not present

## 2023-08-29 ENCOUNTER — Ambulatory Visit: Payer: Medicaid Other | Admitting: Physical Therapy

## 2023-08-29 ENCOUNTER — Ambulatory Visit: Payer: Medicaid Other | Admitting: Occupational Therapy

## 2023-08-29 DIAGNOSIS — G35 Multiple sclerosis: Secondary | ICD-10-CM | POA: Diagnosis not present

## 2023-08-30 DIAGNOSIS — G35 Multiple sclerosis: Secondary | ICD-10-CM | POA: Diagnosis not present

## 2023-08-31 ENCOUNTER — Encounter: Payer: Self-pay | Admitting: Physical Therapy

## 2023-08-31 ENCOUNTER — Ambulatory Visit: Attending: Neurology | Admitting: Physical Therapy

## 2023-08-31 DIAGNOSIS — R208 Other disturbances of skin sensation: Secondary | ICD-10-CM | POA: Diagnosis present

## 2023-08-31 DIAGNOSIS — R278 Other lack of coordination: Secondary | ICD-10-CM | POA: Insufficient documentation

## 2023-08-31 DIAGNOSIS — M6281 Muscle weakness (generalized): Secondary | ICD-10-CM | POA: Diagnosis present

## 2023-08-31 DIAGNOSIS — R296 Repeated falls: Secondary | ICD-10-CM | POA: Diagnosis present

## 2023-08-31 DIAGNOSIS — R2681 Unsteadiness on feet: Secondary | ICD-10-CM | POA: Insufficient documentation

## 2023-08-31 DIAGNOSIS — G35 Multiple sclerosis: Secondary | ICD-10-CM | POA: Diagnosis not present

## 2023-08-31 DIAGNOSIS — R2689 Other abnormalities of gait and mobility: Secondary | ICD-10-CM | POA: Diagnosis present

## 2023-08-31 DIAGNOSIS — M62838 Other muscle spasm: Secondary | ICD-10-CM | POA: Insufficient documentation

## 2023-08-31 NOTE — Therapy (Signed)
 OUTPATIENT PHYSICAL THERAPY NEURO TREATMENT   Patient Name: Bradley Brandt MRN: [id number] DOB:11/08/1980, 43 y.o., male Today's Date: 08/31/2023   PCP: Donell Beers, FNP REFERRING PROVIDER: Asa Lente, MD  END OF SESSION:  PT End of Session - 08/31/23 1454     Visit Number 4    Number of Visits 9   8 + eval   Date for PT Re-Evaluation 09/28/23    Authorization Type [place] MEDICAID [employer]    PT Start Time 1450    PT Stop Time 1528    PT Time Calculation (min) 38 min    Equipment Utilized During Treatment Gait belt    Activity Tolerance Patient tolerated treatment well    Behavior During Therapy Flat affect             Past Medical History:  Diagnosis Date   Anemia    Anxiety    Bipolar 1 disorder (HCC)    Bipolar disorder, unspecified (HCC) 01/03/2013   MS (multiple sclerosis) (HCC) dx 2006   relapsing-remitting right sided weakness   Neuromuscular disorder (HCC)    MS   Pneumonia    Right spastic hemiplegia (HCC)    Substance abuse (HCC)    Suprapubic catheter (HCC)    Past Surgical History:  Procedure Laterality Date   BOTOX INJECTION N/A 03/12/2023   Procedure: BOTOX INJECTION;  Surgeon: Crista Elliot, MD;  Location: [facility];  Service: Urology;  Laterality: N/A;   INSERTION OF SUPRAPUBIC CATHETER N/A 03/12/2023   Procedure: SUPRAPUBIC CATHETER EXCHANGE;  Surgeon: Crista Elliot, MD;  Location: [facility];  Service: Urology;  Laterality: N/A;   IR CATHETER TUBE CHANGE  10/31/2022   IR CATHETER TUBE CHANGE  11/02/2022   IR FLUORO GUIDED NEEDLE PLC ASPIRATION/INJECTION LOC  09/07/2022   TUMOR REMOVAL  1983   abdomen benign   Patient Active Problem List   Diagnosis Date Noted   Need for influenza vaccination 07/20/2023   Hospital discharge follow-up 04/20/2023   Malnutrition of moderate degree 04/11/2023   Acute pyelonephritis 04/03/2023   AKI (acute kidney injury) (HCC) 04/03/2023   Suspected UTI 02/06/2023   Urinary  incontinence 02/06/2023   Suprapubic catheter (HCC) 01/31/2023   Marijuana user 01/17/2023   Tobacco use disorder, continuous 01/17/2023   Impaired mobility and ADLs 01/17/2023   UTI (urinary tract infection) 10/30/2022   Bipolar 1 disorder (HCC) 10/30/2022   Acute urinary retention 08/01/2022   Fever 08/01/2022   Rhabdomyolysis 07/30/2022   Multiple sclerosis exacerbation (HCC) 07/12/2022   Marijuana use 11/22/2020   History of marijuana use 11/22/2020   Chronic pain syndrome 11/21/2020   Encounter for examination following treatment at hospital 11/21/2020   Disorder of skeletal system 11/21/2020   Problems influencing health status 11/21/2020   MS (multiple sclerosis) (HCC)    Neuromuscular disorder (HCC)    Bipolar disorder in full remission (HCC) 11/16/2020   Other insomnia 08/11/2020   Generalized anxiety disorder 08/11/2020   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HCC) 12/31/2017   Optic neuritis 08/02/2017   High risk medication use 08/02/2017   Vitamin D deficiency 09/19/2016   Hypercholesterolemia 09/19/2016   Numbness 12/16/2015   Spastic gait 12/16/2015   Urinary hesitancy 12/16/2015   Low back pain with sciatica 09/28/2014   Polysubstance dependence (HCC) 02/09/2014   Combined drug dependence excluding opioids (HCC) 02/09/2014   Relapsing remitting multiple sclerosis (HCC) 05/28/2013   Elevated BP 01/06/2013   Bipolar disorder, unspecified (HCC) 01/03/2013  Bipolar disorder (HCC) 01/03/2013   Right hemiparesis (HCC) 01/02/2013   Multiple sclerosis flare 01/02/2013   Hemiplegia (HCC) 01/02/2013    ONSET DATE: 2001 (initial diagnosis)  REFERRING DIAG: G35 (ICD-10-CM) - Relapsing remitting multiple sclerosis (HCC) R26.9 (ICD-10-CM) - Gait disturbance  THERAPY DIAG:  Other lack of coordination  Unsteadiness on feet  Other disturbances of skin sensation  Muscle weakness (generalized)  Repeated falls  Other abnormalities of gait and mobility  Other muscle  spasm  Rationale for Evaluation and Treatment: Rehabilitation  SUBJECTIVE:                                                                                                                                                                                             SUBJECTIVE STATEMENT: Pt is on phone playing video game during subjective.  PT attempts multiple times to redirect pt.  He denies pain or other acute changes.  Denies falls or near falls.  His stomach hurts near the catheter and he is tired today. Pt accompanied by: family member - sister waits in lobby  PERTINENT HISTORY: BPD 1, RRMS since 2001, polysubstance dependence, low back pain with sciatica, GAD, suprapubic catheter w/ recurrent UTI  From Neurologist notes: "diagnosed around 2001 when he presented with right-sided numbness and clumsiness and weakness"  "In late 2023/early 2024, he had missed several infusions and presented to the ED 07/11/2022 with an apparent rebound and an MRI showing multiple enbancing lesions in the brain.   There were multiple old spinal cord lesions and an enhancing new focus at T7.   He received 5 days of IV Solumedrol and was discharged.    He got his Tysabri infusion 07/26/2022 (looks like he had previous infusions 04/21/2022 and 03/25/2022)   We discussed Ocrevus or Kesimpta (as might improve compliance). Repeat imaging 07/31/2022 showed a new lest pontomedullary junction and new right centrum semiovale lesion compared to 07/12/2022.   MRI of the cervical and thoracic spine was unchanged.     We discussed the importance of compliance as the benefit will wear off after 2-3 months if doses are skipped.      He can take a few steps with a walker (was ablt to do 80100 feet before the exacerbation 06/2022).   Left foot drop has worsened and he now has trouble raising the left leg.  Balance is very poor and he cannot stand without strong bilateral support and cannot use a walker.[name]    He denies arm weakness or spasticity  but is noted to have weakness in the left arm when tested   no numbness or tingling   Bladder  function is tpoor and has an Foley cath.   He has had increase bladder spasms the last week and family notes change in urine."  PAIN:  Are you having pain? No  PRECAUTIONS: Fall  RED FLAGS: Bowel or bladder incontinence: Yes: pt reports saddle tingling for 3 weeks and he has not spoken to Dr. Epimenio Foot about this    WEIGHT BEARING RESTRICTIONS: No  FALLS: Has patient fallen in last 6 months? Yes. Number of falls several - unsure how many, usually when walking  LIVING ENVIRONMENT: Lives with: lives alone Lives in: House/apartment Stairs: No Has following equipment at home: Dan Humphreys - 2 wheeled, Wheelchair (manual), shower chair, and bed side commode  PLOF: Requires assistive device for independence, Needs assistance with ADLs, Needs assistance with homemaking, Needs assistance with gait, Needs assistance with transfers, and supposed to have 80 hrs of personal care, but has not received this yet, dad says he will follow-up  PATIENT GOALS: to walk  OBJECTIVE:  Note: Objective measures were completed at Evaluation unless otherwise noted.  DIAGNOSTIC FINDINGS: see PMH section  COGNITION: Overall cognitive status: History of cognitive impairments - at baseline and pt reports difficulty with memory and speech, but declines referral to ST services at this time.   SENSATION: Light touch: WFL and pt reports duller sensation R > L  COORDINATION: LE RAMS:  slow and uncoordinated BLE Heel-to-shin:  dysmetric  EDEMA:  None significant in BLE  MUSCLE TONE: Pt unable to relax fully for assessment, possible left LE clonus  POSTURE: No Significant postural limitations  LOWER EXTREMITY ROM:     Active  Right Eval Left Eval  Hip flexion WNL in sitting  Hip extension   Hip abduction   Hip adduction   Hip internal rotation   Hip external rotation   Knee flexion   Knee extension   Ankle  dorsiflexion   Ankle plantarflexion   Ankle inversion    Ankle eversion     (Blank rows = not tested)  LOWER EXTREMITY MMT:    MMT Right Eval Left Eval  Hip flexion 4 3  Hip extension    Hip abduction 4+ 4+  Hip adduction    Hip internal rotation    Hip external rotation    Knee flexion 4+ 4+  Knee extension 4+ 4  Ankle dorsiflexion 3+ 4-  Ankle plantarflexion    Ankle inversion    Ankle eversion    (Blank rows = not tested)  BED MOBILITY:  Pt uses features of hospital bed for independence at home  TRANSFERS: Assistive device utilized: Environmental consultant - 2 wheeled  Sit to stand: Mod A Stand to sit: Mod A Chair to chair: Mod A Floor: Total A  GAIT: Gait pattern: decreased step length- Right, decreased step length- Left, decreased stride length, decreased hip/knee flexion- Right, decreased hip/knee flexion- Left, decreased ankle dorsiflexion- Right, decreased ankle dorsiflexion- Left, ataxic, decreased trunk rotation, trunk flexed, narrow BOS, poor foot clearance- Right, and poor foot clearance- Left Distance walked: 8 ft Assistive device utilized: Walker - 2 wheeled and +2 w/c follow Level of assistance: Min A Comments: Pt catheter bag falls to floor at end of distance w/ pt attempting to reach and pick it up, PT discouraged this due to far distance of walker placement.  He loses his footing/staggers a few times during distance resulting in lifting walker from floor.   FUNCTIONAL TESTS:  None completed due to possible pt tone and ataxia.  PATIENT SURVEYS:  None completed due  to time.                                                                                                                              TREATMENT DATE: 08/31/2023  Upon initiating stand to 2ww PT notes pt pants are wet and smell of urine.  Pt unsure when this happened and why.  PT returns pt to sitting w/ supervision and goes to find and discuss with sister who was also unaware and states this continues to  happen despite recent catheter change.  Noted some sediment in tubing of catheter.  Pt agreeable to allowing +2 therapists to change him.  Changed pt using STS method at 2WW w/ w/c behind in treatment room.  Catheter leg strap noted to be further down thigh placing rubber tubing at max natural length w/ second PT adjusting, sister also encourages pt to maintain slack on this tubing when later discussing this with her at truck, and bifurcation of tubing twisted.  When untwisted line drains a fair amount, uncertain of exact measurement.  Encouraged pt and sister to monitor this area for twisting.  PT performs trunk transfer w/ pt stepping up w/ RLE leading pulling on doorjam of vehicle, CGA, sister cuing to manage catheter line.  Educated sister on checking line and cuing pt to do this as well.  Sister states she will follow up with urologist about ongoing issues with cath line and incontinence.  PATIENT EDUCATION: Education details:  See above. Person educated: Patient and sister Education method: Explanation Education comprehension: verbalized understanding and needs further education  HOME EXERCISE PROGRAM: Access Code: [email or web address] URL: [email or web address] Date: 08/15/2023 Prepared by: Camille Bal  Exercises - Sit to Stand with Counter Support  - 1 x daily - 5 x weekly - 3 sets - 6 reps - Side to Side Weight Shift with Counter Support  - 1 x daily - 7 x weekly - 1-2 sets - 10 reps - Forward Backward Weight Shift with Counter Support  - 1 x daily - 7 x weekly - 1-2 sets - 10 reps - Seated March  - 1 x daily - 7 x weekly - 3 sets - 10 reps - Supine Bridge  - 1 x daily - 5 x weekly - 2 sets - 10 reps - Hooklying Clamshell with Resistance  - 1 x daily - 5 x weekly - 2 sets - 10 reps  GOALS: Goals reviewed with patient? Yes  SHORT TERM GOALS: Target date: 08/24/2023  Pt will be no more than supervision level and compliant with initial standing strength and balance focused HEP  in order to maintain functional progress and improve mobility. Baseline:  To be established. Goal status: INITIAL  2.  Pt will ambulate >/=50 feet with LRAD and no more than supervision level of assist to promote household and community access. Baseline: 8 ft CGA w/ +2 w/c follow Goal status: INITIAL  3.  PT to assess need for bracing  with ambulation/gait fatigue in relation to foot-drop and request order to obtain as appropriate. Baseline: Will further assess. Goal status: INITIAL  4.  Pt will decrease 5xSTS to </=41.03 seconds with improved form in order to demonstrate decreased risk for falls and improved functional bilateral LE strength and power. Baseline:  46.03 sec minA w/ max cuing for safety (2/12) Goal status: INITIAL  LONG TERM GOALS: Target date: 09/21/2023  Pt will be no more than supervision level and compliant with finalized standing strength and balance focused HEP in order to maintain functional progress and improve mobility. Baseline: To be established. Goal status: INITIAL  2.  Pt will ambulate >/=150 feet with LRAD and no more than supervision level of assist to promote household and community access. Baseline: 8 ft CGA w/ +2 w/c follow Goal status: INITIAL  3.  Pt will decrease 5xSTS to </=36.03 seconds with improved form in order to demonstrate decreased risk for falls and improved functional bilateral LE strength and power. Baseline: 46.03 sec minA w/ max cuing for safety (2/12) Goal status: INITIAL  4.  Pt will perform floor recovery with no more than modA in order to improve mobility in the home in the event of a fall. Baseline: Sister usually lifting him TotalA into chair when he falls. Goal status: INITIAL  ASSESSMENT:  CLINICAL IMPRESSION: Session limited by episode of incontinence requiring changing.  Pt requires modA +2 to change briefs and other LE clothing management.  Educated on catheter line management and following up with urologist about ongoing  concerns.  PT will plan to progress ambulatory tolerance and work on static balance in future session if able.  OBJECTIVE IMPAIRMENTS: Abnormal gait, decreased activity tolerance, decreased balance, decreased cognition, decreased coordination, decreased endurance, decreased knowledge of condition, decreased knowledge of use of DME, decreased mobility, difficulty walking, decreased ROM, decreased strength, decreased safety awareness, increased muscle spasms, impaired sensation, impaired tone, and improper body mechanics.   ACTIVITY LIMITATIONS: carrying, lifting, bending, standing, squatting, stairs, transfers, bathing, toileting, dressing, reach over head, hygiene/grooming, and locomotion level  PARTICIPATION LIMITATIONS: meal prep, cleaning, laundry, medication management, driving, shopping, community activity, occupation, and yard work  PERSONAL FACTORS: Age, Behavior pattern, Fitness, Past/current experiences, Social background, Time since onset of injury/illness/exacerbation, and 1-2 comorbidities: recurrent UTI and chronic LBP  are also affecting patient's functional outcome.   REHAB POTENTIAL: Fair noncompliance to infusion regimen, multiple lesion sites and recent relapse, length of time without rehab prior  CLINICAL DECISION MAKING: Evolving/moderate complexity  EVALUATION COMPLEXITY: Moderate  PLAN:  PT FREQUENCY: 1x/week (conserving visits for OT)   PT DURATION: 8 weeks  PLANNED INTERVENTIONS: 97164- PT Re-evaluation, 97110-Therapeutic exercises, 97530- Therapeutic activity, 97112- Neuromuscular re-education, 97535- Self Care, 84696- Manual therapy, [phone]- Gait training, [phone]- Orthotic Fit/training, [phone]- Electrical stimulation (manual), Patient/Family education, Balance training, Stair training, Vestibular training, DME instructions, and Wheelchair mobility training  PLAN FOR NEXT SESSION:   Gait training w/ +2 w/c follow - foot drop/bracing? - unsure (consider ongoing  cognition/caregiving)  Add supine/side-lying strengthening to HEP.  Standing balance and strength.  Floor recovery - +2? - part whole (tall kneel > half kneel using pull-to-stand?) - pt lives alone and needs to be independent/modI when possible.  Sister typically providing transportation and assistance at home as she lives next door!  Check all possible CPT codes: See Planned Interventions List for Planned CPT Codes    Check all conditions that are expected to impact treatment: Cognitive Impairment or Intellectual disability, Contractures, spasticity or fracture relevant  to requested treatment, Neurological condition and/or seizures, and Social determinants of health   If treatment provided at initial evaluation, no treatment charged due to lack of authorization.    Sadie Haber, PT, DPT 08/31/2023, 3:35 PM

## 2023-09-04 DIAGNOSIS — G35 Multiple sclerosis: Secondary | ICD-10-CM | POA: Diagnosis not present

## 2023-09-05 ENCOUNTER — Ambulatory Visit: Payer: Medicaid Other | Admitting: Physical Therapy

## 2023-09-05 ENCOUNTER — Ambulatory Visit: Payer: Medicaid Other | Admitting: Occupational Therapy

## 2023-09-05 DIAGNOSIS — G35 Multiple sclerosis: Secondary | ICD-10-CM | POA: Diagnosis not present

## 2023-09-05 DIAGNOSIS — N310 Uninhibited neuropathic bladder, not elsewhere classified: Secondary | ICD-10-CM | POA: Diagnosis not present

## 2023-09-05 DIAGNOSIS — N311 Reflex neuropathic bladder, not elsewhere classified: Secondary | ICD-10-CM | POA: Diagnosis not present

## 2023-09-06 DIAGNOSIS — G35 Multiple sclerosis: Secondary | ICD-10-CM | POA: Diagnosis not present

## 2023-09-07 DIAGNOSIS — G35 Multiple sclerosis: Secondary | ICD-10-CM | POA: Diagnosis not present

## 2023-09-09 MED ORDER — SODIUM CHLORIDE 0.9 % IV SOLN
1.0000 g | Freq: Once | INTRAVENOUS | Status: AC
  Administered 2023-09-10: 1 g via INTRAVENOUS
  Filled 2023-09-09: qty 1000

## 2023-09-10 ENCOUNTER — Ambulatory Visit (HOSPITAL_COMMUNITY): Payer: Self-pay | Admitting: Physician Assistant

## 2023-09-10 ENCOUNTER — Other Ambulatory Visit: Payer: Self-pay

## 2023-09-10 ENCOUNTER — Ambulatory Visit (HOSPITAL_BASED_OUTPATIENT_CLINIC_OR_DEPARTMENT_OTHER): Payer: Self-pay | Admitting: Anesthesiology

## 2023-09-10 ENCOUNTER — Encounter (HOSPITAL_COMMUNITY)
Admission: RE | Disposition: A | Discharge status: Home / Self Care | Payer: Self-pay | Source: Ambulatory Visit | Attending: Urology

## 2023-09-10 ENCOUNTER — Encounter (HOSPITAL_COMMUNITY): Payer: Self-pay | Admitting: Urology

## 2023-09-10 ENCOUNTER — Ambulatory Visit (HOSPITAL_COMMUNITY)
Admission: RE | Admit: 2023-09-10 | Discharge: 2023-09-10 | Disposition: A | Discharge status: Home / Self Care | Payer: Medicaid Other | Source: Ambulatory Visit | Attending: Urology | Admitting: Urology

## 2023-09-10 DIAGNOSIS — G35 Multiple sclerosis: Secondary | ICD-10-CM | POA: Insufficient documentation

## 2023-09-10 DIAGNOSIS — F419 Anxiety disorder, unspecified: Secondary | ICD-10-CM

## 2023-09-10 DIAGNOSIS — N319 Neuromuscular dysfunction of bladder, unspecified: Secondary | ICD-10-CM

## 2023-09-10 DIAGNOSIS — F17210 Nicotine dependence, cigarettes, uncomplicated: Secondary | ICD-10-CM | POA: Insufficient documentation

## 2023-09-10 DIAGNOSIS — F319 Bipolar disorder, unspecified: Secondary | ICD-10-CM | POA: Diagnosis not present

## 2023-09-10 DIAGNOSIS — G8111 Spastic hemiplegia affecting right dominant side: Secondary | ICD-10-CM | POA: Insufficient documentation

## 2023-09-10 DIAGNOSIS — N310 Uninhibited neuropathic bladder, not elsewhere classified: Secondary | ICD-10-CM | POA: Diagnosis not present

## 2023-09-10 HISTORY — PX: BOTOX INJECTION: SHX5754

## 2023-09-10 HISTORY — PX: INSERTION OF SUPRAPUBIC CATHETER: SHX5870

## 2023-09-10 HISTORY — PX: CYSTOSCOPY: SHX5120

## 2023-09-10 SURGERY — CYSTOSCOPY
Anesthesia: General

## 2023-09-10 MED ORDER — PROPOFOL 10 MG/ML IV BOLUS
INTRAVENOUS | Status: AC
  Filled 2023-09-10: qty 20

## 2023-09-10 MED ORDER — LACTATED RINGERS IV SOLN: INTRAVENOUS | Status: DC | PRN

## 2023-09-10 MED ORDER — DEXAMETHASONE SODIUM PHOSPHATE 10 MG/ML IJ SOLN
INTRAMUSCULAR | Status: DC | PRN
  Administered 2023-09-10: 5 mg via INTRAVENOUS

## 2023-09-10 MED ORDER — ONABOTULINUMTOXINA 100 UNITS IJ SOLR
INTRAMUSCULAR | Status: AC
  Filled 2023-09-10: qty 200

## 2023-09-10 MED ORDER — LIDOCAINE HCL (CARDIAC) PF 100 MG/5ML IV SOSY
PREFILLED_SYRINGE | INTRAVENOUS | Status: DC | PRN
  Administered 2023-09-10: 60 mg via INTRATRACHEAL

## 2023-09-10 MED ORDER — AMISULPRIDE (ANTIEMETIC) 5 MG/2ML IV SOLN: 10.0000 mg | Freq: Once | INTRAVENOUS | Status: DC | PRN

## 2023-09-10 MED ORDER — ACETAMINOPHEN 500 MG PO TABS
1000.0000 mg | ORAL_TABLET | Freq: Once | ORAL | Status: AC
  Administered 2023-09-10: 1000 mg via ORAL
  Filled 2023-09-10: qty 2

## 2023-09-10 MED ORDER — LACTATED RINGERS IV SOLN: INTRAVENOUS | Status: DC

## 2023-09-10 MED ORDER — STERILE WATER FOR IRRIGATION IR SOLN
Status: DC | PRN
  Administered 2023-09-10: 3000 mL

## 2023-09-10 MED ORDER — ONDANSETRON HCL 4 MG/2ML IJ SOLN: 4.0000 mg | Freq: Once | INTRAMUSCULAR | Status: DC | PRN

## 2023-09-10 MED ORDER — DEXAMETHASONE SODIUM PHOSPHATE 10 MG/ML IJ SOLN
INTRAMUSCULAR | Status: AC
  Filled 2023-09-10: qty 1

## 2023-09-10 MED ORDER — ONDANSETRON HCL 4 MG/2ML IJ SOLN
INTRAMUSCULAR | Status: AC
  Filled 2023-09-10: qty 2

## 2023-09-10 MED ORDER — ONABOTULINUMTOXINA 100 UNITS IJ SOLR
INTRAMUSCULAR | Status: DC | PRN
  Administered 2023-09-10: 200 [IU]

## 2023-09-10 MED ORDER — MIDAZOLAM HCL 5 MG/5ML IJ SOLN
INTRAMUSCULAR | Status: DC | PRN
Start: 2023-09-10 — End: 2023-09-10
  Administered 2023-09-10: 2 mg via INTRAVENOUS

## 2023-09-10 MED ORDER — FENTANYL CITRATE (PF) 100 MCG/2ML IJ SOLN
INTRAMUSCULAR | Status: AC
  Filled 2023-09-10: qty 2

## 2023-09-10 MED ORDER — FENTANYL CITRATE PF 50 MCG/ML IJ SOSY: 25.0000 ug | PREFILLED_SYRINGE | INTRAMUSCULAR | Status: DC | PRN

## 2023-09-10 MED ORDER — ONDANSETRON HCL 4 MG/2ML IJ SOLN
INTRAMUSCULAR | Status: DC | PRN
  Administered 2023-09-10: 4 mg via INTRAVENOUS

## 2023-09-10 MED ORDER — SODIUM CHLORIDE (PF) 0.9 % IJ SOLN
INTRAMUSCULAR | Status: AC
  Filled 2023-09-10: qty 20

## 2023-09-10 MED ORDER — PROPOFOL 10 MG/ML IV BOLUS
INTRAVENOUS | Status: DC | PRN
Start: 2023-09-10 — End: 2023-09-10
  Administered 2023-09-10: 130 mg via INTRAVENOUS

## 2023-09-10 MED ORDER — MIDAZOLAM HCL 2 MG/2ML IJ SOLN
INTRAMUSCULAR | Status: AC
Start: 2023-09-10 — End: ?
  Filled 2023-09-10: qty 2

## 2023-09-10 MED ORDER — ORAL CARE MOUTH RINSE: 15.0000 mL | Freq: Once | OROMUCOSAL | Status: AC

## 2023-09-10 MED ORDER — FENTANYL CITRATE (PF) 100 MCG/2ML IJ SOLN
INTRAMUSCULAR | Status: DC | PRN
  Administered 2023-09-10 (×2): 50 ug via INTRAVENOUS

## 2023-09-10 MED ORDER — CHLORHEXIDINE GLUCONATE 0.12 % MT SOLN
15.0000 mL | Freq: Once | OROMUCOSAL | Status: AC
  Administered 2023-09-10: 15 mL via OROMUCOSAL

## 2023-09-10 MED ORDER — SODIUM CHLORIDE (PF) 0.9 % IJ SOLN
INTRAMUSCULAR | Status: AC
  Filled 2023-09-10: qty 50

## 2023-09-10 SURGICAL SUPPLY — 18 items
BAG URINE DRAIN 2000ML AR STRL (UROLOGICAL SUPPLIES) IMPLANT
BAG URO CATCHER STRL LF (MISCELLANEOUS) ×2 IMPLANT
CATH FOLEY 2WAY SLVR 5CC 20FR (CATHETERS) IMPLANT
CATH URETL OPEN END 6FR 70 (CATHETERS) ×2 IMPLANT
CLOTH BEACON ORANGE TIMEOUT ST (SAFETY) ×2 IMPLANT
GLOVE BIO SURGEON STRL SZ7.5 (GLOVE) ×2 IMPLANT
GOWN STRL REUS W/ TWL XL LVL3 (GOWN DISPOSABLE) ×4 IMPLANT
GUIDEWIRE STR DUAL SENSOR (WIRE) ×2 IMPLANT
KIT TURNOVER KIT A (KITS) IMPLANT
MANIFOLD NEPTUNE II (INSTRUMENTS) ×2 IMPLANT
NDL ASPIRATION 22 (NEEDLE) ×2 IMPLANT
NDL SAFETY ECLIPSE 18X1.5 (NEEDLE) IMPLANT
NEEDLE ASPIRATION 22 (NEEDLE) ×2 IMPLANT
PACK CYSTO (CUSTOM PROCEDURE TRAY) ×2 IMPLANT
SYR CONTROL 10ML LL (SYRINGE) IMPLANT
TUBING CONNECTING 10 (TUBING) ×2 IMPLANT
TUBING UROLOGY SET (TUBING) IMPLANT
WATER STERILE IRR 3000ML UROMA (IV SOLUTION) ×2 IMPLANT

## 2023-09-10 NOTE — H&P (Signed)
 CC/HPI: 09/05/2023: Seen as an acute add-on today. He is scheduled for Botox with Dr. Alvester Morin on 3/24. He is supposed to start fosfomycin today in anticipation of the procedure.   He is complaining of severe suprapubic pain and poor catheter drainage. The catheter was last exchanged on 2/20.     ALLERGIES: Lithium    MEDICATIONS: Oxybutynin Chloride Er 10 mg tablet, extended release 24 hr 1 tablet PO BID  Phenazopyridine Hcl 200 mg tablet  Acetaminophen  Baclofen 20 mg tablet  Duloxetine Hcl 30 mg capsule,delayed release  Gabapentin 400 mg capsule  Lidocaine 4 % cream  Melatonin 3 mg tablet  Oxycodone Hcl 5 mg tablet  Quetiapine Fumarate 200 mg tablet  Sulfamethoxazole-Trimethoprim 800 mg-160 mg tablet  Vitamin D2 1,250 mcg (50,000 unit) capsule     GU PSH: Complex cystometrogram, w/ void pressure and urethral pressure profile studies, any technique - 10/19/2022 Complex Uroflow - 10/19/2022 Emg surf Electrd - 10/19/2022 Inject For cystogram - 10/19/2022 Intrabd voidng Press - 10/19/2022 Simple Change SP Tube - 08/09/2023, 06/21/2023, 05/14/2023, 02/22/2023, 02/06/2023     NON-GU PSH: Stomach surgery (unspecified) Visit Complexity (formerly GPC1X) - 05/30/2023, 11/16/2022     GU PMH: Urinary Retention - 08/09/2023, - 06/21/2023, - 05/30/2023, - 05/14/2023, - 02/22/2023, - 02/09/2023, - 02/06/2023, - 11/16/2022, - 10/19/2022, - 10/06/2022, - 08/29/2022 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    REVIEW OF SYSTEMS:    GU Review Male:   Patient denies trouble starting your stream, leakage of urine, stream starts  and stops, erection problems, have to strain to urinate , get up at night to urinate, hard to postpone urination, burning/ pain with urination, penile pain, and frequent urination.  Gastrointestinal (Upper):   Patient denies nausea, vomiting, and indigestion/ heartburn.  Gastrointestinal (Lower):   Patient denies diarrhea and constipation.  Constitutional:   Patient denies fever, night sweats, weight loss, and fatigue.  Skin:   Patient denies skin rash/ lesion and itching.  Eyes:   Patient denies blurred vision and double vision.  Ears/ Nose/ Throat:   Patient denies sore throat and sinus problems.  Hematologic/Lymphatic:   Patient denies swollen glands and easy bruising.  Cardiovascular:   Patient denies leg swelling and chest pains.  Respiratory:   Patient denies cough and shortness of breath.  Endocrine:   Patient denies excessive thirst.  Musculoskeletal:   Patient denies back pain and joint pain.  Neurological:   Patient denies headaches and dizziness.  Psychologic:   Patient denies depression and anxiety.   VITAL SIGNS: None   MULTI-SYSTEM PHYSICAL EXAMINATION:    Constitutional: In wheelchair. Thin. Mild physical deformities. Normally developed. Good grooming.   Neck: Neck symmetrical, not swollen. Normal tracheal position.  Respiratory: No labored breathing, no use of accessory muscles.   Neurologic / Psychiatric: Oriented to time, oriented to place, oriented to person. No depression, no anxiety, no agitation.  Gastrointestinal: Suprapubic tube present with cloudy-yellow urine noted in collection bag. There is severe suprapubic tenderness as well as evidence of prominent granulation tissue surrounding the site.     Complexity of Data:  Source Of History:  Patient,  Medical Record Summary  Records Review:   Previous Doctor Records, Previous Hospital Records, Previous Patient Records  Urine Test Review:   Urine Culture   PROCEDURES:         Simple Change SP Tube - 51705  The  patient's indwelling SP tube was carefully removed. A 20 French Foley catheter was inserted into the bladder using sterile technique. The patient was taught routine catheter care. Hand irrigation of the bladder with sterile water was performed. A bedside bag was connected.         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 He felt a lot more comfortable following this. He is having a combination of worsening bladder spasms but I think a lot of his pain and discomfort is from what appears to be dilation tissue suprapubic tract opening. Once he has Botox and bladder spasms settle down, he may benefit from using some silver nitrate sticks to cauterize some of this excessive tissue which ultimately may result in some down-regulation of the superficial pain and discomfort he has been having. Strongly encouraged him to go ahead and start the fosfomycin today in effort to sterilize the urine in anticipation of upcoming Botox. This was communicated to family members who brought him in today as well.        Next Appointment:      Next Appointment: 09/10/2023 07:30 AM    Appointment Type: Surgery     Location: [facility] [phone]    Provider: Modena Slater, III, M.D.    Reason for Visit: OP--[facility]--CYSTO/BOTOX PT Covered      Signed by Anne Fu, NP on 09/05/23 at 9:31 AM (EDT

## 2023-09-10 NOTE — Discharge Instructions (Addendum)
 You may have some blood in the urine.  This is normal as long as her catheter is draining.  Call the office or go to the emergency department if your catheter stops draining or for fever greater than 101.

## 2023-09-10 NOTE — Anesthesia Preprocedure Evaluation (Addendum)
 Anesthesia Evaluation  Patient identified by MRN, date of birth, ID band Patient awake    Reviewed: Allergy & Precautions, NPO status , Patient's Chart, lab work & pertinent test results  Airway Mallampati: III  TM Distance: >3 FB Neck ROM: Full    Dental  (+) Dental Advisory Given, Missing   Pulmonary Current Smoker and Patient abstained from smoking.   Pulmonary exam normal breath sounds clear to auscultation       Cardiovascular negative cardio ROS Normal cardiovascular exam Rhythm:Regular Rate:Normal     Neuro/Psych  PSYCHIATRIC DISORDERS Anxiety  Bipolar Disorder   MS Right spastic hemiplegia  Neuromuscular disease    GI/Hepatic negative GI ROS, Neg liver ROS,,,  Endo/Other  negative endocrine ROS    Renal/GU negative Renal ROS   NEUROGENIC BALDDER    Musculoskeletal negative musculoskeletal ROS (+)    Abdominal   Peds  Hematology  (+) Blood dyscrasia, anemia   Anesthesia Other Findings Day of surgery medications reviewed with the patient.  Reproductive/Obstetrics                             Anesthesia Physical Anesthesia Plan  ASA: 3  Anesthesia Plan: General   Post-op Pain Management: Tylenol PO (pre-op)*   Induction: Intravenous  PONV Risk Score and Plan: 1 and Midazolam, Dexamethasone and Ondansetron  Airway Management Planned: LMA  Additional Equipment:   Intra-op Plan:   Post-operative Plan: Extubation in OR  Informed Consent: I have reviewed the patients History and Physical, chart, labs and discussed the procedure including the risks, benefits and alternatives for the proposed anesthesia with the patient or authorized representative who has indicated his/her understanding and acceptance.     Dental advisory given  Plan Discussed with: CRNA  Anesthesia Plan Comments:         Anesthesia Quick Evaluation

## 2023-09-10 NOTE — Op Note (Signed)
 Operative Note  Preoperative diagnosis:  1.  Neurogenic bladder with detrusor overactivity  Postoperative diagnosis: 1.  Same  Procedure(s): 1.  Cystoscopy with intra detrusor Botox injection, 200 units 2.  Simple suprapubic tube exchange  Surgeon: Modena Slater, MD  Assistants: None  Anesthesia: General  Complications: None immediate  EBL: Minimal  Specimens: 1.  None  Drains/Catheters: 1.  20 French suprapubic tube  Intraoperative findings: 1.  Normal anterior urethra 2.  Nonobstructing prostate 3.  Bladder mucosa without any tumors masses or stones  Indication: 43 year old male with neurogenic bladder with detrusor overactivity managed with suprapubic tube presents for intra detrusor Botox  Description of procedure:  The patient was identified and consent was obtained.  The patient was taken to the operating room and placed in the supine position.  The patient was placed under general anesthesia.  Perioperative antibiotics were administered.  The patient was placed in dorsal lithotomy.  Patient was prepped and draped in a standard sterile fashion and a timeout was performed.  A rigid cystoscope was advanced into the urethra and into the bladder.  Complete cystoscopy was performed with findings noted above.  200 units of Botox was systematically injected throughout the bladder.  There was no significant active bleeding noted.  Scope was withdrawn and a 20 French suprapubic tube placed.  This concluded the operation.  Patient tolerated the procedure well was stable postoperatively.  Plan: Return in 6 months for repeat Botox

## 2023-09-10 NOTE — Anesthesia Postprocedure Evaluation (Signed)
 Anesthesia Post Note  Patient: Bradley Brandt  Procedure(s) Performed: CYSTOSCOPY BOTOX INJECTION SUPRAPUBIC CATHETER EXCHANGE     Patient location during evaluation: PACU Anesthesia Type: General Level of consciousness: awake and alert Pain management: pain level controlled Vital Signs Assessment: post-procedure vital signs reviewed and stable Respiratory status: spontaneous breathing, nonlabored ventilation and respiratory function stable Cardiovascular status: blood pressure returned to baseline and stable Postop Assessment: no apparent nausea or vomiting Anesthetic complications: no   No notable events documented.  Last Vitals:  Vitals:   09/10/23 0915 09/10/23 0938  BP: 107/67   Pulse: (!) 58   Resp: 17   Temp: (!) 36.4 C 36.4 C  SpO2: 97%     Last Pain:  Vitals:   09/10/23 0938  TempSrc: Oral  PainSc:                  Collene Schlichter

## 2023-09-10 NOTE — Anesthesia Procedure Notes (Signed)
 Procedure Name: Intubation Date/Time: 09/10/2023 7:55 AM  Performed by: Deri Fuelling, CRNAPre-anesthesia Checklist: Patient identified, Emergency Drugs available, Suction available and Patient being monitored Patient Re-evaluated:Patient Re-evaluated prior to induction Oxygen Delivery Method: Circle system utilized Preoxygenation: Pre-oxygenation with 100% oxygen Induction Type: IV induction Ventilation: Mask ventilation without difficulty LMA: LMA inserted and LMA with gastric port inserted LMA Size: 4.0 Tube type: Oral Number of attempts: 1 Airway Equipment and Method: Stylet and Oral airway Placement Confirmation: ETT inserted through vocal cords under direct vision, positive ETCO2 and breath sounds checked- equal and bilateral Tube secured with: Tape Dental Injury: Teeth and Oropharynx as per pre-operative assessment

## 2023-09-10 NOTE — Transfer of Care (Signed)
 Immediate Anesthesia Transfer of Care Note  Patient: Bradley Brandt  Procedure(s) Performed: CYSTOSCOPY BOTOX INJECTION SUPRAPUBIC CATHETER EXCHANGE  Patient Location: PACU  Anesthesia Type:General  Level of Consciousness: awake and alert   Airway & Oxygen Therapy: Patient Spontanous Breathing and Patient connected to nasal cannula oxygen  Post-op Assessment: Report given to RN and Post -op Vital signs reviewed and stable  Post vital signs: Reviewed and stable  Last Vitals:  Vitals Value Taken Time  BP 126/73 09/10/23 0826  Temp    Pulse 65 09/10/23 0828  Resp 17 09/10/23 0828  SpO2 100 % 09/10/23 0828  Vitals shown include unfiled device data.  Last Pain:  Vitals:   09/10/23 0634  TempSrc:   PainSc: 6          Complications: No notable events documented.

## 2023-09-11 ENCOUNTER — Encounter (HOSPITAL_COMMUNITY): Payer: Self-pay | Admitting: Urology

## 2023-09-11 DIAGNOSIS — G35 Multiple sclerosis: Secondary | ICD-10-CM | POA: Diagnosis not present

## 2023-09-12 ENCOUNTER — Ambulatory Visit: Payer: Medicaid Other | Admitting: Physical Therapy

## 2023-09-12 ENCOUNTER — Ambulatory Visit: Payer: Medicaid Other | Admitting: Occupational Therapy

## 2023-09-12 DIAGNOSIS — G35 Multiple sclerosis: Secondary | ICD-10-CM | POA: Diagnosis not present

## 2023-09-13 ENCOUNTER — Telehealth: Payer: Self-pay

## 2023-09-13 DIAGNOSIS — G35 Multiple sclerosis: Secondary | ICD-10-CM | POA: Diagnosis not present

## 2023-09-17 ENCOUNTER — Ambulatory Visit

## 2023-09-17 ENCOUNTER — Ambulatory Visit (INDEPENDENT_AMBULATORY_CARE_PROVIDER_SITE_OTHER)

## 2023-09-17 VITALS — BP 131/78 | HR 83 | Temp 98.3°F | Resp 16 | Ht 71.0 in | Wt 180.0 lb

## 2023-09-17 DIAGNOSIS — R3981 Functional urinary incontinence: Secondary | ICD-10-CM | POA: Diagnosis not present

## 2023-09-17 DIAGNOSIS — G35 Multiple sclerosis: Secondary | ICD-10-CM

## 2023-09-17 MED ORDER — SODIUM CHLORIDE 0.9 % IV SOLN
300.0000 mg | Freq: Once | INTRAVENOUS | Status: AC
  Administered 2023-09-17: 300 mg via INTRAVENOUS
  Filled 2023-09-17: qty 15

## 2023-09-17 MED ORDER — ACETAMINOPHEN 325 MG PO TABS: 650.0000 mg | ORAL_TABLET | Freq: Once | ORAL | Status: DC

## 2023-09-17 MED ORDER — LORATADINE 10 MG PO TABS
10.0000 mg | ORAL_TABLET | Freq: Once | ORAL | Status: AC
Start: 2023-09-17 — End: 2023-09-17
  Administered 2023-09-17: 10 mg via ORAL
  Filled 2023-09-17: qty 1

## 2023-09-17 MED ORDER — SODIUM CHLORIDE 0.9 % IV SOLN
INTRAVENOUS | Status: DC
Start: 2023-09-17 — End: 2023-09-17

## 2023-09-17 MED ORDER — ACETAMINOPHEN 325 MG PO TABS
650.0000 mg | ORAL_TABLET | Freq: Once | ORAL | Status: AC
  Administered 2023-09-17: 650 mg via ORAL
  Filled 2023-09-17: qty 2

## 2023-09-17 MED ORDER — LORATADINE 10 MG PO TABS: 10.0000 mg | ORAL_TABLET | Freq: Once | ORAL | Status: DC

## 2023-09-17 NOTE — Progress Notes (Signed)
 Diagnosis: Multiple Sclerosis  Provider:  Chilton Greathouse MD  Procedure: IV Infusion  IV Type: Peripheral, IV Location: L Antecubital  Tysabri (Natalizumab), Dose: 300 mg  Infusion Start Time: 1358  Infusion Stop Time: 1500  Post Infusion IV Care: Patient declined observation and Peripheral IV Discontinued  Discharge: Condition: Good, Destination: Home . AVS Declined  Performed by:  Nat Math, RN

## 2023-09-18 ENCOUNTER — Encounter: Payer: Self-pay | Admitting: Neurology

## 2023-09-18 DIAGNOSIS — G35 Multiple sclerosis: Secondary | ICD-10-CM | POA: Diagnosis not present

## 2023-09-18 NOTE — Telephone Encounter (Signed)
 Forms faxed

## 2023-09-19 ENCOUNTER — Encounter: Payer: Self-pay | Admitting: Physical Therapy

## 2023-09-19 ENCOUNTER — Encounter: Payer: Self-pay | Admitting: Occupational Therapy

## 2023-09-19 ENCOUNTER — Ambulatory Visit: Payer: Medicaid Other | Attending: Neurology | Admitting: Physical Therapy

## 2023-09-19 ENCOUNTER — Telehealth: Payer: Self-pay | Admitting: Physical Therapy

## 2023-09-19 ENCOUNTER — Other Ambulatory Visit: Payer: Self-pay | Admitting: Neurology

## 2023-09-19 DIAGNOSIS — G8114 Spastic hemiplegia affecting left nondominant side: Secondary | ICD-10-CM

## 2023-09-19 DIAGNOSIS — R278 Other lack of coordination: Secondary | ICD-10-CM

## 2023-09-19 DIAGNOSIS — G35 Multiple sclerosis: Secondary | ICD-10-CM | POA: Diagnosis not present

## 2023-09-19 DIAGNOSIS — R296 Repeated falls: Secondary | ICD-10-CM

## 2023-09-19 DIAGNOSIS — R2681 Unsteadiness on feet: Secondary | ICD-10-CM

## 2023-09-19 DIAGNOSIS — R2689 Other abnormalities of gait and mobility: Secondary | ICD-10-CM

## 2023-09-19 DIAGNOSIS — M6281 Muscle weakness (generalized): Secondary | ICD-10-CM

## 2023-09-19 DIAGNOSIS — R208 Other disturbances of skin sensation: Secondary | ICD-10-CM

## 2023-09-19 NOTE — Therapy (Signed)
 [facility] [address] [place], [place], [address] Phone: [phone]   Fax:  [phone]  Patient Details  Name: Bradley Brandt MRN: [id number] Date of Birth: 1980-10-12 Referring Provider:  No ref. provider found  Encounter Date: 09/19/2023  Pt has not been seen in over a month with multiple cancellations. P.T. spoke to pt's family re: discharge at this time and getting orders for home health therapies as pt would be better served in his own environment to negotiate functional transfers and safety in the home. Pt/family agreeable. Will d/c episode of care at this time   Sheran Lawless, OT 09/19/2023, 10:04 AM  [facility] [address] [place], [place], [address] Phone: [phone]   Fax:  [phone]

## 2023-09-19 NOTE — Telephone Encounter (Signed)
 Dr. Epimenio Foot, [name] was discharged by OPPT and OT on 09/19/2023.  The patient would benefit from HHPT and HHOT evaluations for safety in the home and improved mobility and ADL performance.   If you agree, please place an order. Thank you, Camille Bal, PT, DPT   [facility] [address] [place], [place]  [address] Phone:  [phone] Fax:  [phone]

## 2023-09-19 NOTE — Therapy (Signed)
 [facility] [address] [place], [place], [address] Phone: [phone]   Fax:  [phone]  Patient Details - DISCHARGE SUMMARY Name: ALDAIR RICKEL MRN: [id number] Date of Birth: March 26, 1981 Referring Provider:  No ref. provider found  Encounter Date: 09/19/2023  PHYSICAL THERAPY DISCHARGE SUMMARY  Visits from Start of Care: 4  Current functional level related to goals / functional outcomes: See below.   Remaining deficits: Fall risk, gait abnormality, functional weakness   Education / Equipment: Discussed referral to HHPT/OT with sister as she returned call to clinic.  She agrees this would ease transportation burden and therapist's concerns for ease of mobility and safety in home setup.   Will discharge and seek [facility] referrals.  OT in agreement to PT POC change.  Patient agrees to discharge. Patient goals were  not assessed . Patient is being discharged due to  needing rehab in home.   Sadie Haber, PT, DPT 09/19/2023, 10:02 AM  [facility] [address] [place], [place], [address] Phone: [phone]   Fax:  [phone]

## 2023-09-19 NOTE — Telephone Encounter (Signed)
 PT attempted to contact pt at number in chart w/o answer and unidentified voicemail to discuss missed PT appt today and further outpt vs possible HHPT/OT services due to ongoing transportation issues and needs for mobility in his home due to caregiver limitations.  If pt contacts clinic to reschedule appt will further discuss face-to-face.  Camille Bal, PT, DPT

## 2023-09-20 DIAGNOSIS — G35 Multiple sclerosis: Secondary | ICD-10-CM | POA: Diagnosis not present

## 2023-09-21 DIAGNOSIS — G35 Multiple sclerosis: Secondary | ICD-10-CM | POA: Diagnosis not present

## 2023-09-24 DIAGNOSIS — M6282 Rhabdomyolysis: Secondary | ICD-10-CM | POA: Diagnosis not present

## 2023-09-24 DIAGNOSIS — G35 Multiple sclerosis: Secondary | ICD-10-CM | POA: Diagnosis not present

## 2023-09-24 DIAGNOSIS — M6281 Muscle weakness (generalized): Secondary | ICD-10-CM | POA: Diagnosis not present

## 2023-09-25 ENCOUNTER — Telehealth: Payer: Self-pay | Admitting: Physical Therapy

## 2023-09-25 DIAGNOSIS — G35 Multiple sclerosis: Secondary | ICD-10-CM | POA: Diagnosis not present

## 2023-09-25 NOTE — Telephone Encounter (Signed)
 Spoke to patient's father regarding concerns on OPPT/OT discharge.  Answered all questions as appropriate and encouraged him to reach out to person assisting with CAP eligibility (Ms. Daughtry).  Also informed that HHPT/OT referral placed 4/2 and expected process for this.  Camille Bal, PT, DPT

## 2023-09-26 DIAGNOSIS — G35 Multiple sclerosis: Secondary | ICD-10-CM | POA: Diagnosis not present

## 2023-09-27 ENCOUNTER — Telehealth: Payer: Self-pay | Admitting: Neurology

## 2023-09-27 DIAGNOSIS — G35 Multiple sclerosis: Secondary | ICD-10-CM | POA: Diagnosis not present

## 2023-09-27 NOTE — Telephone Encounter (Signed)
 Called [facility], they are able to accept patient for PT but not OT. They did state that PT can address OT needs, and if something opens up with OT they can offer that as well. They are going to process referral and will get in touch with him to schedule.

## 2023-09-28 DIAGNOSIS — G35 Multiple sclerosis: Secondary | ICD-10-CM | POA: Diagnosis not present

## 2023-10-02 ENCOUNTER — Telehealth: Payer: Self-pay | Admitting: Neurology

## 2023-10-02 NOTE — Telephone Encounter (Signed)
 [facility] (June ) On  the 10/01/23 did a home health visit. Notifying Dr. Godwin Lat will not admit patient home health  physical therapy due to patient smokes marijuana. Because that will greatly effect therapy.

## 2023-10-02 NOTE — Telephone Encounter (Signed)
 Waiting on response from Dr. Godwin Lat to see how he wants to proceed.

## 2023-10-02 NOTE — Telephone Encounter (Signed)
 Per MD: "We can just let the patient (or family know) and they can decide if he wants to quit."

## 2023-10-02 NOTE — Telephone Encounter (Signed)
 Spoke w/ sister (on [place]). Relayed message to her and pt. He agreed to stop marijuana use in order to complete therapy. They will call back to try and r/s. Phone# [phone]

## 2023-10-03 DIAGNOSIS — G35 Multiple sclerosis: Secondary | ICD-10-CM | POA: Diagnosis not present

## 2023-10-08 DIAGNOSIS — G35 Multiple sclerosis: Secondary | ICD-10-CM | POA: Diagnosis not present

## 2023-10-09 DIAGNOSIS — G35 Multiple sclerosis: Secondary | ICD-10-CM | POA: Diagnosis not present

## 2023-10-10 DIAGNOSIS — G35 Multiple sclerosis: Secondary | ICD-10-CM | POA: Diagnosis not present

## 2023-10-11 DIAGNOSIS — G35 Multiple sclerosis: Secondary | ICD-10-CM | POA: Diagnosis not present

## 2023-10-12 DIAGNOSIS — G35 Multiple sclerosis: Secondary | ICD-10-CM | POA: Diagnosis not present

## 2023-10-15 ENCOUNTER — Encounter (HOSPITAL_COMMUNITY): Payer: Self-pay

## 2023-10-15 ENCOUNTER — Other Ambulatory Visit: Payer: Self-pay

## 2023-10-15 ENCOUNTER — Ambulatory Visit

## 2023-10-15 ENCOUNTER — Emergency Department (HOSPITAL_COMMUNITY)
Admission: EM | Admit: 2023-10-15 | Discharge: 2023-10-15 | Disposition: A | Discharge status: Home / Self Care | Attending: Emergency Medicine | Admitting: Emergency Medicine

## 2023-10-15 DIAGNOSIS — T83098A Other mechanical complication of other indwelling urethral catheter, initial encounter: Secondary | ICD-10-CM | POA: Diagnosis not present

## 2023-10-15 DIAGNOSIS — Z466 Encounter for fitting and adjustment of urinary device: Secondary | ICD-10-CM | POA: Diagnosis not present

## 2023-10-15 DIAGNOSIS — Z435 Encounter for attention to cystostomy: Secondary | ICD-10-CM

## 2023-10-15 DIAGNOSIS — R531 Weakness: Secondary | ICD-10-CM | POA: Diagnosis not present

## 2023-10-15 DIAGNOSIS — G35 Multiple sclerosis: Secondary | ICD-10-CM | POA: Diagnosis not present

## 2023-10-15 DIAGNOSIS — Z743 Need for continuous supervision: Secondary | ICD-10-CM | POA: Diagnosis not present

## 2023-10-15 MED ORDER — HYDROCODONE-ACETAMINOPHEN 5-325 MG PO TABS
2.0000 | ORAL_TABLET | Freq: Once | ORAL | Status: AC
  Administered 2023-10-15: 2 via ORAL
  Filled 2023-10-15: qty 2

## 2023-10-15 NOTE — ED Triage Notes (Signed)
 BIB EMS from home for suprapubic catheter that has been pulled out some unintentionally. Urine is still draining, pt c/o abdominal pain and has some minor bleeding at insertion site.

## 2023-10-16 DIAGNOSIS — G35 Multiple sclerosis: Secondary | ICD-10-CM | POA: Diagnosis not present

## 2023-10-16 NOTE — ED Provider Notes (Signed)
 [place] EMERGENCY DEPARTMENT AT [facility] Provider Note   CSN: [id number] Arrival date & time: 10/15/23  1851     History  Chief Complaint  Patient presents with   Urinary Catheter Problem (Suprapubic)    Bradley Brandt is a 43 y.o. male.  Patient reports to being to catheter was accidentally pulled.  Patient complains of pain at his stoma site.  Patient reports he has not had any bleeding.  The catheter did not pull out.  Patient came to the emergency department to make sure that catheter is still working normally.  Patient has a past medical history of hemiplegia multiple sclerosis and urinary retention.  Patient denies any other symptoms he has not had any nausea or vomiting he denies any fever or chills.        Home Medications Prior to Admission medications   Medication Sig Start Date End Date Taking? Authorizing Provider  DULoxetine  (CYMBALTA ) 30 MG capsule Take 1 capsule (30 mg total) by mouth daily. Patient not taking: Reported on 07/18/2023 01/17/23   Paseda, Folashade R, FNP  gabapentin  (NEURONTIN ) 400 MG capsule Take 1 capsule (400 mg total) by mouth 3 (three) times daily. Patient not taking: Reported on 07/18/2023 01/19/23   Paseda, Folashade R, FNP  natalizumab  (TYSABRI ) 300 MG/15ML injection Inject 15 mLs (300 mg total) into the vein every 28 (twenty-eight) days. 03/20/23   Sater, Sherida Dimmer, MD  oxybutynin  (DITROPAN -XL) 10 MG 24 hr tablet Take 1 tablet (10 mg total) by mouth daily. Patient not taking: Reported on 08/22/2023 01/17/23   Paseda, Folashade R, FNP  Vitamin D , Ergocalciferol , (DRISDOL ) 1.25 MG (50000 UNIT) CAPS capsule Take 1 capsule (50,000 Units total) by mouth every 7 (seven) days. Patient not taking: Reported on 08/22/2023 07/22/23   Jorie Newness, MD      Allergies    Lithium    Review of Systems   Review of Systems  All other systems reviewed and are negative.   Physical Exam Updated Vital Signs BP 129/77   Pulse 74   Temp 98.3  F (36.8 C)   Resp 17   Ht 5\' 11"  (1.803 m)   Wt 63.5 kg   SpO2 100%   BMI 19.53 kg/m  Physical Exam Vitals and nursing note reviewed.  Constitutional:      Appearance: He is well-developed.  HENT:     Head: Normocephalic.  Cardiovascular:     Rate and Rhythm: Normal rate.  Pulmonary:     Effort: Pulmonary effort is normal.  Abdominal:     General: There is no distension.     Comments: No bleeding at suprapubic site, no blood in urine.  Skin:    General: Skin is warm.  Neurological:     General: No focal deficit present.     Mental Status: He is alert and oriented to person, place, and time.     ED Results / Procedures / Treatments   Labs (all labs ordered are listed, but only abnormal results are displayed) Labs Reviewed - No data to display  EKG None  Radiology No results found.  Procedures Procedures    Medications Ordered in ED Medications  HYDROcodone -acetaminophen  (NORCO/VICODIN) 5-325 MG per tablet 2 tablet (2 tablets Oral Given 10/15/23 2010)    ED Course/ Medical Decision Making/ A&P  Medical Decision Making Patient reports suprapubic catheter was accidentally pulled on and he has pain at the site.  Patient denies any current bleeding  Amount and/or Complexity of Data Reviewed Independent Historian: EMS    Details: Patient brought in by EMS who is supportive  Risk Prescription drug management. Risk Details: Patient is given 2 hydrocodone  here.  Patient has normal yellow drainage from site.  No disruption of tube.  Patient is discharged in stable condition.           Final Clinical Impression(s) / ED Diagnoses Final diagnoses:  Encounter for suprapubic catheter care [facility])    Rx / DC Orders ED Discharge Orders     None      An After Visit Summary was printed and given to the patient.    Sandi Crosby, PA-C 10/16/23 2221    Sallyanne Creamer, DO 10/22/23 1139

## 2023-10-17 ENCOUNTER — Ambulatory Visit (INDEPENDENT_AMBULATORY_CARE_PROVIDER_SITE_OTHER)

## 2023-10-17 VITALS — BP 99/63 | HR 65 | Temp 98.4°F | Resp 16 | Ht 71.0 in | Wt 160.0 lb

## 2023-10-17 DIAGNOSIS — G35 Multiple sclerosis: Secondary | ICD-10-CM | POA: Diagnosis not present

## 2023-10-17 MED ORDER — SODIUM CHLORIDE 0.9 % IV SOLN
300.0000 mg | Freq: Once | INTRAVENOUS | Status: AC
  Administered 2023-10-17: 300 mg via INTRAVENOUS
  Filled 2023-10-17: qty 15

## 2023-10-17 MED ORDER — LORATADINE 10 MG PO TABS
10.0000 mg | ORAL_TABLET | Freq: Once | ORAL | Status: AC
Start: 2023-10-17 — End: 2023-10-17
  Administered 2023-10-17: 10 mg via ORAL
  Filled 2023-10-17: qty 1

## 2023-10-17 MED ORDER — ACETAMINOPHEN 325 MG PO TABS
650.0000 mg | ORAL_TABLET | Freq: Once | ORAL | Status: AC
  Administered 2023-10-17: 650 mg via ORAL
  Filled 2023-10-17: qty 2

## 2023-10-17 NOTE — Progress Notes (Signed)
 Diagnosis: Multiple Sclerosis  Provider:  Phyllis Breeze MD  Procedure: IV Infusion  IV Type: Peripheral, IV Location: L Antecubital  Tysabri  (Natalizumab ), Dose: 300 mg  Infusion Start Time: 1401  Infusion Stop Time: 1506  Post Infusion IV Care: Patient declined observation,PIV discontinued.  Discharge: Condition: Good, Destination: Home . AVS Provided  Performed by:  Natividad Balding, RN

## 2023-10-18 DIAGNOSIS — G35 Multiple sclerosis: Secondary | ICD-10-CM | POA: Diagnosis not present

## 2023-10-20 DIAGNOSIS — G35 Multiple sclerosis: Secondary | ICD-10-CM | POA: Diagnosis not present

## 2023-10-22 DIAGNOSIS — G35 Multiple sclerosis: Secondary | ICD-10-CM | POA: Diagnosis not present

## 2023-10-23 DIAGNOSIS — G35 Multiple sclerosis: Secondary | ICD-10-CM | POA: Diagnosis not present

## 2023-10-24 DIAGNOSIS — M6281 Muscle weakness (generalized): Secondary | ICD-10-CM | POA: Diagnosis not present

## 2023-10-24 DIAGNOSIS — G35 Multiple sclerosis: Secondary | ICD-10-CM | POA: Diagnosis not present

## 2023-10-24 DIAGNOSIS — M6282 Rhabdomyolysis: Secondary | ICD-10-CM | POA: Diagnosis not present

## 2023-10-25 DIAGNOSIS — N310 Uninhibited neuropathic bladder, not elsewhere classified: Secondary | ICD-10-CM | POA: Diagnosis not present

## 2023-10-25 DIAGNOSIS — R338 Other retention of urine: Secondary | ICD-10-CM | POA: Diagnosis not present

## 2023-10-25 DIAGNOSIS — N3000 Acute cystitis without hematuria: Secondary | ICD-10-CM | POA: Diagnosis not present

## 2023-10-25 DIAGNOSIS — G35 Multiple sclerosis: Secondary | ICD-10-CM | POA: Diagnosis not present

## 2023-10-25 DIAGNOSIS — N311 Reflex neuropathic bladder, not elsewhere classified: Secondary | ICD-10-CM | POA: Diagnosis not present

## 2023-10-26 DIAGNOSIS — G35 Multiple sclerosis: Secondary | ICD-10-CM | POA: Diagnosis not present

## 2023-10-29 DIAGNOSIS — G35 Multiple sclerosis: Secondary | ICD-10-CM | POA: Diagnosis not present

## 2023-10-30 DIAGNOSIS — G35 Multiple sclerosis: Secondary | ICD-10-CM | POA: Diagnosis not present

## 2023-10-31 DIAGNOSIS — G35 Multiple sclerosis: Secondary | ICD-10-CM | POA: Diagnosis not present

## 2023-11-01 DIAGNOSIS — G35 Multiple sclerosis: Secondary | ICD-10-CM | POA: Diagnosis not present

## 2023-11-02 DIAGNOSIS — G35 Multiple sclerosis: Secondary | ICD-10-CM | POA: Diagnosis not present

## 2023-11-05 DIAGNOSIS — G35 Multiple sclerosis: Secondary | ICD-10-CM | POA: Diagnosis not present

## 2023-11-06 DIAGNOSIS — G35 Multiple sclerosis: Secondary | ICD-10-CM | POA: Diagnosis not present

## 2023-11-07 DIAGNOSIS — G35 Multiple sclerosis: Secondary | ICD-10-CM | POA: Diagnosis not present

## 2023-11-08 DIAGNOSIS — G35 Multiple sclerosis: Secondary | ICD-10-CM | POA: Diagnosis not present

## 2023-11-08 DIAGNOSIS — R338 Other retention of urine: Secondary | ICD-10-CM | POA: Diagnosis not present

## 2023-11-09 DIAGNOSIS — G35 Multiple sclerosis: Secondary | ICD-10-CM | POA: Diagnosis not present

## 2023-11-13 DIAGNOSIS — G35 Multiple sclerosis: Secondary | ICD-10-CM | POA: Diagnosis not present

## 2023-11-14 ENCOUNTER — Ambulatory Visit

## 2023-11-14 DIAGNOSIS — G35 Multiple sclerosis: Secondary | ICD-10-CM | POA: Diagnosis not present

## 2023-11-15 DIAGNOSIS — G35 Multiple sclerosis: Secondary | ICD-10-CM | POA: Diagnosis not present

## 2023-11-16 DIAGNOSIS — R338 Other retention of urine: Secondary | ICD-10-CM | POA: Diagnosis not present

## 2023-11-16 DIAGNOSIS — G35 Multiple sclerosis: Secondary | ICD-10-CM | POA: Diagnosis not present

## 2023-11-19 ENCOUNTER — Ambulatory Visit: Payer: Self-pay | Admitting: Nurse Practitioner

## 2023-11-19 DIAGNOSIS — G35 Multiple sclerosis: Secondary | ICD-10-CM | POA: Diagnosis not present

## 2023-11-20 DIAGNOSIS — G35 Multiple sclerosis: Secondary | ICD-10-CM | POA: Diagnosis not present

## 2023-11-21 DIAGNOSIS — G35 Multiple sclerosis: Secondary | ICD-10-CM | POA: Diagnosis not present

## 2023-11-22 DIAGNOSIS — G35 Multiple sclerosis: Secondary | ICD-10-CM | POA: Diagnosis not present

## 2023-11-23 ENCOUNTER — Ambulatory Visit (INDEPENDENT_AMBULATORY_CARE_PROVIDER_SITE_OTHER)

## 2023-11-23 ENCOUNTER — Other Ambulatory Visit: Payer: Self-pay

## 2023-11-23 VITALS — BP 106/63 | HR 74 | Temp 97.9°F | Resp 18 | Ht 71.0 in | Wt 166.4 lb

## 2023-11-23 DIAGNOSIS — G35 Multiple sclerosis: Secondary | ICD-10-CM

## 2023-11-23 MED ORDER — SODIUM CHLORIDE 0.9 % IV SOLN
300.0000 mg | Freq: Once | INTRAVENOUS | Status: AC
  Administered 2023-11-23: 300 mg via INTRAVENOUS
  Filled 2023-11-23: qty 15

## 2023-11-23 MED ORDER — ACETAMINOPHEN 325 MG PO TABS
650.0000 mg | ORAL_TABLET | Freq: Once | ORAL | Status: AC
  Administered 2023-11-23: 650 mg via ORAL
  Filled 2023-11-23: qty 2

## 2023-11-23 MED ORDER — LORATADINE 10 MG PO TABS
10.0000 mg | ORAL_TABLET | Freq: Once | ORAL | Status: AC
  Administered 2023-11-23: 10 mg via ORAL
  Filled 2023-11-23: qty 1

## 2023-11-23 MED ORDER — SODIUM CHLORIDE 0.9 % IV SOLN: INTRAVENOUS | Status: DC

## 2023-11-23 NOTE — Progress Notes (Signed)
 Diagnosis: Multiple Sclerosis  Provider:  Phyllis Breeze MD  Procedure: IV Infusion  IV Type: Peripheral, IV Location: R Forearm  Tysabri  (Natalizumab ), Dose: 300 mg  Infusion Start Time: 1140  Infusion Stop Time: 1246  Post Infusion IV Care: Patient declined observation and Peripheral IV Discontinued  Discharge: Condition: Good, Destination: Home . AVS Declined  Performed by:  Bethzaida Boord, RN

## 2023-11-24 DIAGNOSIS — M6281 Muscle weakness (generalized): Secondary | ICD-10-CM | POA: Diagnosis not present

## 2023-11-24 DIAGNOSIS — M6282 Rhabdomyolysis: Secondary | ICD-10-CM | POA: Diagnosis not present

## 2023-11-24 DIAGNOSIS — G35 Multiple sclerosis: Secondary | ICD-10-CM | POA: Diagnosis not present

## 2023-11-26 DIAGNOSIS — G35 Multiple sclerosis: Secondary | ICD-10-CM | POA: Diagnosis not present

## 2023-11-27 LAB — COMPREHENSIVE METABOLIC PANEL WITH GFR
AG Ratio: 1.3 (calc) (ref 1.0–2.5)
ALT: 12 U/L (ref 9–46)
AST: 12 U/L (ref 10–40)
Albumin: 3.3 g/dL — ABNORMAL LOW (ref 3.6–5.1)
Alkaline phosphatase (APISO): 66 U/L (ref 36–130)
BUN: 10 mg/dL (ref 7–25)
CO2: 26 mmol/L (ref 20–32)
Calcium: 8.7 mg/dL (ref 8.6–10.3)
Chloride: 108 mmol/L (ref 98–110)
Creat: 1.09 mg/dL (ref 0.60–1.29)
Globulin: 2.6 g/dL (ref 1.9–3.7)
Glucose, Bld: 87 mg/dL (ref 65–99)
Potassium: 3.8 mmol/L (ref 3.5–5.3)
Sodium: 142 mmol/L (ref 135–146)
Total Bilirubin: 0.2 mg/dL (ref 0.2–1.2)
Total Protein: 5.9 g/dL — ABNORMAL LOW (ref 6.1–8.1)
eGFR: 85 mL/min/{1.73_m2} (ref >=60)

## 2023-11-27 LAB — CBC WITH DIFFERENTIAL/PLATELET
Absolute Lymphocytes: 2827 {cells}/uL (ref 850–3900)
Absolute Monocytes: 676 {cells}/uL (ref 200–950)
Basophils Absolute: 38 {cells}/uL (ref 0–200)
Basophils Relative: 0.5 %
Eosinophils Absolute: 266 {cells}/uL (ref 15–500)
Eosinophils Relative: 3.5 %
HCT: 33.9 % — ABNORMAL LOW (ref 38.5–50.0)
Hemoglobin: 10.8 g/dL — ABNORMAL LOW (ref 13.2–17.1)
MCH: 29.8 pg (ref 27.0–33.0)
MCHC: 31.9 g/dL — ABNORMAL LOW (ref 32.0–36.0)
MCV: 93.6 fL (ref 80.0–100.0)
MPV: 10.2 fL (ref 7.5–12.5)
Monocytes Relative: 8.9 %
Neutro Abs: 3792 {cells}/uL (ref 1500–7800)
Neutrophils Relative %: 49.9 %
Platelets: 313 Thousand/uL (ref 140–400)
RBC: 3.62 Million/uL — ABNORMAL LOW (ref 4.20–5.80)
RDW: 14.5 % (ref 11.0–15.0)
Total Lymphocyte: 37.2 %
WBC: 7.6 Thousand/uL (ref 3.8–10.8)

## 2023-11-27 LAB — STRATIFY JCV AB (W/ INDEX) W/ RFLX
Index Value: 0.15 {index}
Stratify JCV (TM) Ab w/Reflex Inhibition: NEGATIVE

## 2023-11-28 ENCOUNTER — Ambulatory Visit: Payer: Self-pay | Admitting: Neurology

## 2023-11-28 DIAGNOSIS — G35 Multiple sclerosis: Secondary | ICD-10-CM | POA: Diagnosis not present

## 2023-11-30 DIAGNOSIS — G35 Multiple sclerosis: Secondary | ICD-10-CM | POA: Diagnosis not present

## 2023-12-03 DIAGNOSIS — G35 Multiple sclerosis: Secondary | ICD-10-CM | POA: Diagnosis not present

## 2023-12-04 DIAGNOSIS — G35 Multiple sclerosis: Secondary | ICD-10-CM | POA: Diagnosis not present

## 2023-12-05 DIAGNOSIS — G35 Multiple sclerosis: Secondary | ICD-10-CM | POA: Diagnosis not present

## 2023-12-07 DIAGNOSIS — G35 Multiple sclerosis: Secondary | ICD-10-CM | POA: Diagnosis not present

## 2023-12-10 DIAGNOSIS — G35 Multiple sclerosis: Secondary | ICD-10-CM | POA: Diagnosis not present

## 2023-12-12 DIAGNOSIS — G35 Multiple sclerosis: Secondary | ICD-10-CM | POA: Diagnosis not present

## 2023-12-13 DIAGNOSIS — R300 Dysuria: Secondary | ICD-10-CM | POA: Diagnosis not present

## 2023-12-13 DIAGNOSIS — G35 Multiple sclerosis: Secondary | ICD-10-CM | POA: Diagnosis not present

## 2023-12-13 DIAGNOSIS — N311 Reflex neuropathic bladder, not elsewhere classified: Secondary | ICD-10-CM | POA: Diagnosis not present

## 2023-12-13 DIAGNOSIS — N310 Uninhibited neuropathic bladder, not elsewhere classified: Secondary | ICD-10-CM | POA: Diagnosis not present

## 2023-12-13 DIAGNOSIS — N3941 Urge incontinence: Secondary | ICD-10-CM | POA: Diagnosis not present

## 2023-12-14 DIAGNOSIS — G35 Multiple sclerosis: Secondary | ICD-10-CM | POA: Diagnosis not present

## 2023-12-17 DIAGNOSIS — G35 Multiple sclerosis: Secondary | ICD-10-CM | POA: Diagnosis not present

## 2023-12-18 DIAGNOSIS — G35 Multiple sclerosis: Secondary | ICD-10-CM | POA: Diagnosis not present

## 2023-12-19 DIAGNOSIS — G35 Multiple sclerosis: Secondary | ICD-10-CM | POA: Diagnosis not present

## 2023-12-20 DIAGNOSIS — G35 Multiple sclerosis: Secondary | ICD-10-CM | POA: Diagnosis not present

## 2023-12-24 ENCOUNTER — Ambulatory Visit

## 2023-12-24 DIAGNOSIS — G35 Multiple sclerosis: Secondary | ICD-10-CM | POA: Diagnosis not present

## 2023-12-24 MED ORDER — SODIUM CHLORIDE 0.9 % IV SOLN: INTRAVENOUS | Status: DC

## 2023-12-24 MED ORDER — LORATADINE 10 MG PO TABS: 10.0000 mg | ORAL_TABLET | Freq: Once | ORAL | Status: DC

## 2023-12-24 MED ORDER — ACETAMINOPHEN 325 MG PO TABS: 650.0000 mg | ORAL_TABLET | Freq: Once | ORAL | Status: DC

## 2023-12-25 DIAGNOSIS — G35 Multiple sclerosis: Secondary | ICD-10-CM | POA: Diagnosis not present

## 2023-12-26 DIAGNOSIS — G35 Multiple sclerosis: Secondary | ICD-10-CM | POA: Diagnosis not present

## 2023-12-27 DIAGNOSIS — G35 Multiple sclerosis: Secondary | ICD-10-CM | POA: Diagnosis not present

## 2023-12-28 DIAGNOSIS — G35 Multiple sclerosis: Secondary | ICD-10-CM | POA: Diagnosis not present

## 2023-12-31 DIAGNOSIS — G35 Multiple sclerosis: Secondary | ICD-10-CM | POA: Diagnosis not present

## 2024-01-01 ENCOUNTER — Ambulatory Visit (INDEPENDENT_AMBULATORY_CARE_PROVIDER_SITE_OTHER)

## 2024-01-01 VITALS — BP 115/61 | HR 74 | Temp 98.3°F | Resp 18 | Ht 71.0 in | Wt 164.6 lb

## 2024-01-01 DIAGNOSIS — G35 Multiple sclerosis: Secondary | ICD-10-CM | POA: Diagnosis not present

## 2024-01-01 MED ORDER — SODIUM CHLORIDE 0.9 % IV SOLN
300.0000 mg | Freq: Once | INTRAVENOUS | Status: AC
  Administered 2024-01-01: 300 mg via INTRAVENOUS
  Filled 2024-01-01: qty 15

## 2024-01-01 MED ORDER — SODIUM CHLORIDE 0.9 % IV SOLN: INTRAVENOUS | Status: DC

## 2024-01-01 MED ORDER — LORATADINE 10 MG PO TABS
10.0000 mg | ORAL_TABLET | Freq: Once | ORAL | Status: AC
  Administered 2024-01-01: 10 mg via ORAL
  Filled 2024-01-01: qty 1

## 2024-01-01 MED ORDER — ACETAMINOPHEN 325 MG PO TABS
650.0000 mg | ORAL_TABLET | Freq: Once | ORAL | Status: AC
  Administered 2024-01-01: 650 mg via ORAL
  Filled 2024-01-01: qty 2

## 2024-01-01 NOTE — Progress Notes (Signed)
 Diagnosis: Multiple Sclerosis  Provider:  Lonna Coder MD  Procedure: IV Infusion  IV Type: Peripheral, IV Location: R Forearm  Tysabri  (Natalizumab ), Dose: 300 mg  Infusion Start Time: 1113  Infusion Stop Time: 1216  Post Infusion IV Care: Patient declined observation and Peripheral IV Discontinued  Discharge: Condition: Good, Destination: Home . AVS Declined  Performed by:  Ambrie Carte, RN

## 2024-01-02 DIAGNOSIS — G35 Multiple sclerosis: Secondary | ICD-10-CM | POA: Diagnosis not present

## 2024-01-04 DIAGNOSIS — G35 Multiple sclerosis: Secondary | ICD-10-CM | POA: Diagnosis not present

## 2024-01-07 ENCOUNTER — Telehealth: Payer: Self-pay

## 2024-01-07 DIAGNOSIS — G35 Multiple sclerosis: Secondary | ICD-10-CM

## 2024-01-07 NOTE — Progress Notes (Unsigned)
 Complex Care Management Note Care Guide Note  01/07/2024 Name: Bradley Brandt MRN: [id number] DOB: 08-Jul-1980   Complex Care Management Outreach Attempts: An unsuccessful telephone outreach was attempted today to offer the patient information about available complex care management services.  Follow Up Plan:  Additional outreach attempts will be made to offer the patient complex care management information and services.   Encounter Outcome:  No Answer  Dreama [facility]  [facility], [facility] Health Care Management Assistant Direct Dial: [phone]  Fax: [phone]

## 2024-01-08 DIAGNOSIS — G35 Multiple sclerosis: Secondary | ICD-10-CM | POA: Diagnosis not present

## 2024-01-09 DIAGNOSIS — G35 Multiple sclerosis: Secondary | ICD-10-CM | POA: Diagnosis not present

## 2024-01-09 NOTE — Progress Notes (Unsigned)
 Complex Care Management Note Care Guide Note  01/09/2024 Name: Bradley Brandt MRN: [id number] DOB: 04/20/1981   Complex Care Management Outreach Attempts: A second unsuccessful outreach was attempted today to offer the patient with information about available complex care management services.  Follow Up Plan:  Additional outreach attempts will be made to offer the patient complex care management information and services.   Encounter Outcome:  No Answer  Dreama [facility]  [facility], [facility] Health Care Management Assistant Direct Dial: [phone]  Fax: [phone]

## 2024-01-10 DIAGNOSIS — G35 Multiple sclerosis: Secondary | ICD-10-CM | POA: Diagnosis not present

## 2024-01-10 NOTE — Progress Notes (Signed)
 Complex Care Management Note  Care Guide Note 01/10/2024 Name: Bradley Brandt MRN: [id number] DOB: Jun 21, 1980  Bradley Brandt is a 43 y.o. year old male who sees Paseda, Folashade R, FNP for primary care. I reached out to Bradley Brandt by phone today to offer complex care management services.  Bradley Brandt was given information about Complex Care Management services today including:   The Complex Care Management services include support from the care team which includes your Nurse Care Manager, Clinical Social Worker, or Pharmacist.  The Complex Care Management team is here to help remove barriers to the health concerns and goals most important to you. Complex Care Management services are voluntary, and the patient may decline or stop services at any time by request to their care team member.   Complex Care Management Consent Status: Patient agreed to services and verbal consent obtained.   Follow up plan:  Telephone appointment with complex care management team member scheduled for:  01/23/24 at 10:00 a.m.   Encounter Outcome:  Patient Scheduled  Dreama [facility]  [facility], [facility] Health Care Management Assistant Direct Dial: [phone]  Fax: [phone]

## 2024-01-10 NOTE — Progress Notes (Signed)
 Complex Care Management Note Care Guide Note  01/10/2024 Name: Bradley Brandt MRN: [id number] DOB: 05/16/1981   Complex Care Management Outreach Attempts: A third unsuccessful outreach was attempted today to offer the patient with information about available complex care management services.  Follow Up Plan:  No further outreach attempts will be made at this time. We have been unable to contact the patient to offer or enroll patient in complex care management services.  Encounter Outcome:  No Answer  Dreama [facility]  [facility], [facility] Health Care Management Assistant Direct Dial: [phone]  Fax: [phone]

## 2024-01-12 DIAGNOSIS — G35 Multiple sclerosis: Secondary | ICD-10-CM | POA: Diagnosis not present

## 2024-01-13 DIAGNOSIS — G35 Multiple sclerosis: Secondary | ICD-10-CM | POA: Diagnosis not present

## 2024-01-14 DIAGNOSIS — G35 Multiple sclerosis: Secondary | ICD-10-CM | POA: Diagnosis not present

## 2024-01-15 DIAGNOSIS — G35 Multiple sclerosis: Secondary | ICD-10-CM | POA: Diagnosis not present

## 2024-01-17 ENCOUNTER — Other Ambulatory Visit: Payer: Self-pay | Admitting: Neurology

## 2024-01-17 DIAGNOSIS — G35 Multiple sclerosis: Secondary | ICD-10-CM

## 2024-01-17 MED ORDER — TYSABRI 300 MG/15ML IV CONC: 300.0000 mg | INTRAVENOUS | 11 refills | Status: AC

## 2024-01-18 ENCOUNTER — Telehealth: Payer: Self-pay

## 2024-01-18 DIAGNOSIS — G35 Multiple sclerosis: Secondary | ICD-10-CM | POA: Diagnosis not present

## 2024-01-18 NOTE — Telephone Encounter (Signed)
 Faxed pt's script to [phone]

## 2024-01-21 ENCOUNTER — Other Ambulatory Visit: Payer: Self-pay | Admitting: Urology

## 2024-01-21 DIAGNOSIS — R338 Other retention of urine: Secondary | ICD-10-CM | POA: Diagnosis not present

## 2024-01-21 DIAGNOSIS — G35 Multiple sclerosis: Secondary | ICD-10-CM | POA: Diagnosis not present

## 2024-01-22 DIAGNOSIS — G35 Multiple sclerosis: Secondary | ICD-10-CM | POA: Diagnosis not present

## 2024-01-23 ENCOUNTER — Telehealth: Payer: Self-pay

## 2024-01-23 DIAGNOSIS — G35 Multiple sclerosis: Secondary | ICD-10-CM | POA: Diagnosis not present

## 2024-01-24 DIAGNOSIS — G35 Multiple sclerosis: Secondary | ICD-10-CM | POA: Diagnosis not present

## 2024-01-25 DIAGNOSIS — G35 Multiple sclerosis: Secondary | ICD-10-CM | POA: Diagnosis not present

## 2024-01-28 DIAGNOSIS — G35 Multiple sclerosis: Secondary | ICD-10-CM | POA: Diagnosis not present

## 2024-01-29 ENCOUNTER — Ambulatory Visit (INDEPENDENT_AMBULATORY_CARE_PROVIDER_SITE_OTHER)

## 2024-01-29 ENCOUNTER — Telehealth: Payer: Self-pay | Admitting: *Deleted

## 2024-01-29 VITALS — BP 95/61 | HR 72 | Temp 97.8°F | Resp 18 | Ht 71.0 in | Wt 160.4 lb

## 2024-01-29 DIAGNOSIS — G35 Multiple sclerosis: Secondary | ICD-10-CM

## 2024-01-29 MED ORDER — SODIUM CHLORIDE 0.9 % IV SOLN
300.0000 mg | Freq: Once | INTRAVENOUS | Status: AC
  Administered 2024-01-29 (×2): 300 mg via INTRAVENOUS
  Filled 2024-01-29: qty 15

## 2024-01-29 MED ORDER — ACETAMINOPHEN 325 MG PO TABS
650.0000 mg | ORAL_TABLET | Freq: Once | ORAL | Status: AC
  Administered 2024-01-29 (×2): 650 mg via ORAL
  Filled 2024-01-29: qty 2

## 2024-01-29 MED ORDER — LORATADINE 10 MG PO TABS
10.0000 mg | ORAL_TABLET | Freq: Once | ORAL | Status: AC
  Administered 2024-01-29 (×2): 10 mg via ORAL
  Filled 2024-01-29: qty 1

## 2024-01-29 MED ORDER — SODIUM CHLORIDE 0.9 % IV SOLN: INTRAVENOUS | Status: DC

## 2024-01-29 NOTE — Progress Notes (Unsigned)
 Complex Care Management Care Guide Note  01/29/2024 Name: Bradley Brandt MRN: [id number] DOB: 07-19-1980  Bradley Brandt is a 43 y.o. year old male who is a primary care patient of Paseda, Folashade R, FNP and is actively engaged with the care management team. I reached out to Ozell MARLA Grippe by phone today to assist with re-scheduling  with the RN Case Manager.  Follow up plan: Unsuccessful telephone outreach attempt made. A HIPAA compliant phone message was left for the patient providing contact information and requesting a return call.  Harlene Satterfield  [facility]  [facility], [facility] Guide  Direct Dial: [phone]  Fax [phone]

## 2024-01-29 NOTE — Progress Notes (Signed)
 Diagnosis: Multiple Sclerosis  Provider:  Lonna Coder MD  Procedure: IV Infusion  IV Type: Peripheral, IV Location: L Forearm  Tysabri  (Natalizumab ), Dose: 300 mg  Infusion Start Time: 1133  Infusion Stop Time: 1301  Post Infusion IV Care: Patient declined observation and Peripheral IV Discontinued  Discharge: Condition: Good, Destination: Home . AVS Provided  Performed by:  Georgetta Crafton, RN

## 2024-01-30 ENCOUNTER — Ambulatory Visit (INDEPENDENT_AMBULATORY_CARE_PROVIDER_SITE_OTHER): Payer: Medicaid Other | Admitting: Neurology

## 2024-01-30 ENCOUNTER — Encounter: Payer: Self-pay | Admitting: Neurology

## 2024-01-30 VITALS — BP 103/54 | HR 92 | Ht 71.0 in | Wt 158.5 lb

## 2024-01-30 DIAGNOSIS — Z7409 Other reduced mobility: Secondary | ICD-10-CM

## 2024-01-30 DIAGNOSIS — G801 Spastic diplegic cerebral palsy: Secondary | ICD-10-CM

## 2024-01-30 DIAGNOSIS — Z789 Other specified health status: Secondary | ICD-10-CM

## 2024-01-30 DIAGNOSIS — G8114 Spastic hemiplegia affecting left nondominant side: Secondary | ICD-10-CM

## 2024-01-30 DIAGNOSIS — Z978 Presence of other specified devices: Secondary | ICD-10-CM | POA: Diagnosis not present

## 2024-01-30 DIAGNOSIS — N319 Neuromuscular dysfunction of bladder, unspecified: Secondary | ICD-10-CM

## 2024-01-30 DIAGNOSIS — G35 Multiple sclerosis: Secondary | ICD-10-CM

## 2024-01-30 DIAGNOSIS — R208 Other disturbances of skin sensation: Secondary | ICD-10-CM

## 2024-01-30 DIAGNOSIS — Z79899 Other long term (current) drug therapy: Secondary | ICD-10-CM

## 2024-01-30 MED ORDER — BACLOFEN 10 MG PO TABS: 10.0000 mg | ORAL_TABLET | Freq: Three times a day (TID) | ORAL | 11 refills | Status: AC

## 2024-01-30 MED ORDER — LAMOTRIGINE 100 MG PO TABS: 100.0000 mg | ORAL_TABLET | Freq: Two times a day (BID) | ORAL | 11 refills | Status: DC

## 2024-01-30 NOTE — Progress Notes (Signed)
 [name]  PATIENT: Bradley Brandt DOB: 1981/03/10  REFERRING DOCTOR OR PCP:  Peggi Hiss SOURCE: patient, records form Dr. Abou Zeid, MRI reports and images on PACS  _________________________________   HISTORICAL  CHIEF COMPLAINT:  Chief Complaint  Patient presents with   RM10/MS    Pt is here with his sister. Pt states that he still isn't able to walk. Pt states he would like to talk. Pt states that he has a UTI right now. Pt states that he is having spasms in his legs. Pt states that he would like something for his pain that he is having the ms hug. Pt is wanting to to be back on Baclofen .     HISTORY OF PRESENT ILLNESS:  Bradley Brandt is a 43 y.o.  man with relapsing remitting multiple sclerosis.     Update 01/09/2023   He is back on Tysabri  and tolerating it well.   His last infusion was 01/01/2023.   He is stable with no exacerbations since restarting Tysabri .    He is JCV Ab negative last checked 11/23/23.     He reports more abdominal pain wit a squeezing sensation  In late 2023/early 2024, he had missed several infusions and presented to the ED 07/11/2022 with an apparent rebound and an MRI showing multiple enbancing lesions in the brain.   There were multiple old spinal cord lesions and an enhancing new focus at T7.   He received 5 days of IV Solumedrol and was discharged.    He got his Tysabri  infusion 07/26/2022 (looks like he had previous infusions 04/21/2022 and 03/25/2022)   We discussed Ocrevus or Kesimpta (as might improve compliance). Repeat imaging 07/31/2022 showed a new lest pontomedullary junction and new right centrum semiovale lesion compared to 07/12/2022.   MRI of the cervical and thoracic spine was unchanged.     We discussed the importance of compliance as the benefit will wear off after 2-3 months if doses are skipped.     Currently he can walk only about 15 to 20 feet with a walker.  He has had some falls walking and transferring.  Left  leg is weaker than right.   Balance is very poor and he cannot stand without  support    He denies arm weakness or spasticity but is noted to have weakness in the left arm when tested   no numbness or tingling   Bladder function is tpoor and has an Foley cath.   He has had increase bladder spasms the last week and family notes change in urine.    Vision is doing the same.     He has stable fatigue.   He notes some depression, similar to earlier in the year.   He has more anxiety.   He is seeing psychiatry again.   He was diagnosed with bipolar disease   He is sleeping worse.[name]   He notes cognition is about the same.  Focus is sometimes poor.         MS history: Tiernan was diagnosed around 2001 when he presented with right-sided numbness and clumsiness and weakness.   Initially, he was on Avonex  and then Rebif  but had relapses on both of them.  He could not tolerate Tecfidera.   He was started on Tysabri  about 1 year ago and his last infusion was at [facility] at 45/22/2017.  He was seen Dr. Annelle [facility] and those records were reviewed. He did receive a  course of Solu-Medrol  March 2015 and another course of Solu-Medrol  January 2016 when he presented with left leg weakness and numbness that was new    An MRI of the brain 08/28/2013 personally reviewed shows old extensive white matter changes in both hemispheres with multiple sclerosis. There were new areas of restricted diffusion without definite enhancement that could be consistent with ongoing demyelination when compared to the MRI from 05/13/2013.  He missed 2-3 doses of Tysabri  December 2023 in January 2023 and had a rebound with multiple enhancing lesions towards the end of January 2024.  He was treated with high-dose steroids and Tysabri  was resumed.   We had also discussed anti-CD20 agents if compliance is difficult.   Imaging: MRI of the brain 08/30/2019 showed multiple T2/FLAIR hyperintense foci in the hemispheres, pons,  cerebellum and thalamus in a pattern and configuration consistent with chronic demyelinating plaque associated with multiple sclerosis.  None of the foci appear to be acute and they do not enhance.  Compared to the MRI dated 01/14/2019, there is no interval progression.  MRI of the cervical spine 01/06/2018 showed T2 hyperintense foci within the spinal cord posteriorly adjacent to C2, to the right adjacent to C3, to the left adjacent to C4 and centrally and to the right adjacent to C4-C5 and C5.   None of these appear to be acute and they were all present on the 2015 MRI.[name]    MRI of the brain 01/06/2018 showed multiple T2/FLAIR hyperintense foci in the hemispheres and in the left middle cerebellar peduncle consistent with chronic demyelinating plaque associated with multiple sclerosis.  None of the foci appears to be acute.  None of them enhance.  However, when compared to the 2015 MRI focus in the cerebellum and a couple foci in the hemispheres were not clearly present on the earlier MRI.    MRI of the brain and spine 07/12/2022 showed MRI showing multiple enbancing lesions in the brain.   There were multiple old spinal cord lesions and an enhancing new focus at T7.  Repeat imaging 07/31/2022 showed a new lest pontomedullary junction and new right centrum semiovale lesion compared to 07/12/2022.   MRI of the cervical and thoracic spine was unchanged   REVIEW OF SYSTEMS: Constitutional: No fevers, chills, sweats, or change in appetite.   Notes fatigue Eyes: No visual changes, double vision, eye pain Ear, nose and throat: No hearing loss, ear pain, nasal congestion, sore throat Cardiovascular: No chest pain, palpitations Respiratory:  No shortness of breath at rest or with exertion.   No wheezes GastrointestinaI: No nausea, vomiting, diarrhea, abdominal pain, fecal incontinence Genitourinary:  He has hesitancy and frequency.   No UTI's. Musculoskeletal:  No neck pain, back pain Integumentary: No rash,  pruritus, skin lesions Neurological: as above Psychiatric: No depression at this time.  No anxiety Endocrine: No palpitations, diaphoresis, change in appetite, change in weigh or increased thirst Hematologic/Lymphatic:  No anemia, purpura, petechiae. Allergic/Immunologic: No itchy/runny eyes, nasal congestion, recent allergic reactions, rashes  ALLERGIES: Allergies  Allergen Reactions   Lithium Rash    HOME MEDICATIONS:  Current Outpatient Medications:    lamoTRIgine  (LAMICTAL ) 100 MG tablet, Take 1 tablet (100 mg total) by mouth 2 (two) times daily. For 5 days, take 1/2 pill daily, for next 5 days take 1/2 pill tice a day, for next 5 days, take 1/2 oill in the morning and 1 pill at night   then take 1 pill twice a day, Disp: 60 tablet, Rfl: 11  natalizumab  (TYSABRI ) 300 MG/15ML injection, Inject 15 mLs (300 mg total) into the vein every 28 (twenty-eight) days. Please fax to [phone]., Disp: 15 mL, Rfl: 11   oxybutynin  (DITROPAN -XL) 10 MG 24 hr tablet, Take 1 tablet (10 mg total) by mouth daily., Disp: 90 tablet, Rfl: 1   baclofen  (LIORESAL ) 10 MG tablet, Take 1 tablet (10 mg total) by mouth 3 (three) times daily., Disp: 90 tablet, Rfl: 11   DULoxetine  (CYMBALTA ) 30 MG capsule, Take 1 capsule (30 mg total) by mouth daily. (Patient not taking: Reported on 01/30/2024), Disp: 90 capsule, Rfl: 1   gabapentin  (NEURONTIN ) 400 MG capsule, Take 1 capsule (400 mg total) by mouth 3 (three) times daily. (Patient not taking: Reported on 07/18/2023), Disp: 270 capsule, Rfl: 1   Vitamin D , Ergocalciferol , (DRISDOL ) 1.25 MG (50000 UNIT) CAPS capsule, Take 1 capsule (50,000 Units total) by mouth every 7 (seven) days. (Patient not taking: Reported on 08/22/2023), Disp: 13 capsule, Rfl: 1 No current facility-administered medications for this visit.  Facility-Administered Medications Ordered in Other Visits:    0.9 %  sodium chloride  infusion, , Intravenous, Continuous, Kannan Proia A, MD   acetaminophen   (TYLENOL ) tablet 650 mg, 650 mg, Oral, Once, Aswad Wandrey, Charlie LABOR, MD   loratadine  (CLARITIN ) tablet 10 mg, 10 mg, Oral, Once, Ladarrell Cornwall, Charlie LABOR, MD  PAST MEDICAL HISTORY: Past Medical History:  Diagnosis Date   Anemia    Anxiety    Bipolar 1 disorder (HCC)    Bipolar disorder, unspecified (HCC) 01/03/2013   MS (multiple sclerosis) (HCC) dx 2006   relapsing-remitting right sided weakness   Neuromuscular disorder (HCC)    MS   Pneumonia    Right spastic hemiplegia (HCC)    Substance abuse (HCC)    Suprapubic catheter (HCC)     PAST SURGICAL HISTORY: Past Surgical History:  Procedure Laterality Date   BOTOX  INJECTION N/A 03/12/2023   Procedure: BOTOX  INJECTION;  Surgeon: Carolee Sherwood JONETTA DOUGLAS, MD;  Location: [facility];  Service: Urology;  Laterality: N/A;   BOTOX  INJECTION N/A 09/10/2023   Procedure: BOTOX  INJECTION;  Surgeon: Carolee Sherwood JONETTA DOUGLAS, MD;  Location: [facility];  Service: Urology;  Laterality: N/A;   CYSTOSCOPY N/A 09/10/2023   Procedure: CYSTOSCOPY;  Surgeon: Carolee Sherwood JONETTA DOUGLAS, MD;  Location: [facility];  Service: Urology;  Laterality: N/A;  45 MINUTE CASE   INSERTION OF SUPRAPUBIC CATHETER N/A 03/12/2023   Procedure: SUPRAPUBIC CATHETER EXCHANGE;  Surgeon: Carolee Sherwood JONETTA DOUGLAS, MD;  Location: [facility];  Service: Urology;  Laterality: N/A;   INSERTION OF SUPRAPUBIC CATHETER  09/10/2023   Procedure: SUPRAPUBIC CATHETER EXCHANGE;  Surgeon: Carolee Sherwood JONETTA DOUGLAS, MD;  Location: [facility];  Service: Urology;;   IR CATHETER TUBE CHANGE  10/31/2022   IR CATHETER TUBE CHANGE  11/02/2022   IR FLUORO GUIDED NEEDLE PLC ASPIRATION/INJECTION LOC  09/07/2022   TUMOR REMOVAL  1983   abdomen benign    FAMILY HISTORY: Family History  Adopted: Yes  Problem Relation Age of Onset   Lupus Mother    Ataxia Neg Hx    Chorea Neg Hx    Dementia Neg Hx    Mental retardation Neg Hx    Migraines Neg Hx    Multiple sclerosis Neg Hx    Neurofibromatosis Neg Hx    Neuropathy Neg Hx    Parkinsonism Neg Hx    Seizures  Neg Hx    Stroke Neg Hx     SOCIAL HISTORY:  Social History  Socioeconomic History   Marital status: Single    Spouse name: Not on file   Number of children: Not on file   Years of education: Not on file   Highest education level: Not on file  Occupational History   Not on file  Tobacco Use   Smoking status: Every Day    Current packs/day: 1.00    Types: Cigarettes    Passive exposure: Current   Smokeless tobacco: Never  Vaping Use   Vaping status: Former  Substance and Sexual Activity   Alcohol use: Yes    Alcohol/week: 32.0 standard drinks of alcohol    Types: 10 Glasses of wine, 12 Cans of beer, 10 Shots of liquor per week   Drug use: Yes    Frequency: 7.0 times per week    Types: Marijuana    Comment: daily   Sexual activity: Yes    Partners: Female    Birth control/protection: None  Other Topics Concern   Not on file  Social History Narrative   Lives home alone.   Social Drivers of Corporate investment banker Strain: Not on file  Food Insecurity: Patient Declined (04/03/2023)   Hunger Vital Sign    Worried About Running Out of Food in the Last Year: Patient declined    Ran Out of Food in the Last Year: Patient declined  Transportation Needs: No Transportation Needs (04/03/2023)   PRAPARE - Administrator, Civil Service (Medical): No    Lack of Transportation (Non-Medical): No  Physical Activity: Not on file  Stress: Not on file  Social Connections: Unknown (10/17/2021)   [facility]   Social Network    Social Network: Not on file  Intimate Partner Violence: Not At Risk (04/03/2023)   Humiliation, Afraid, Rape, and Kick questionnaire    Fear of Current or Ex-Partner: No    Emotionally Abused: No    Physically Abused: No    Sexually Abused: No     PHYSICAL EXAM  Vitals:   01/30/24 1423  BP: (!) 103/54  Pulse: 92  SpO2: 99%  Weight: 158 lb 8 oz (71.9 kg)  Height: 5' 11 (1.803 m)      Body mass index is 22.11  kg/m.   General: The patient is well-developed and well-nourished and in no acute distress.  Has indwelling catheter   Neurologic Exam   Mental status: The patient is alert and oriented x 3 at the time of the examination. The patient has apparent normal recent and remote memory, with an apparently normal attention span and concentration ability.   Speech is normal.   Cranial nerves: Extraocular movements are full.   2+ right APD.   Reduced visual acuity and reduced color vision OD     Facial strength is normal. Trapezius strength is strong.  No obvious hearing deficits are noted.   Motor:  Muscle bulk is normal.  He has reduced rapid altering movements in left  hand worse than right.  There is increased muscle tone in the left arm and both legs  Strength is 5/5 in the right arm, for a left triceps and 4+-5/5 in the left arm.  And 4-/5 in the right leg and 4/5 in the left leg.      Sensory: He reports symmetric sensationin arms bu reduced left leg vibration sensation.  TOuch is symmetric.      Coordination: Cerebellar testing reveals reduced finger-nose-finger and very poor left heel-to-shin   Gait and station: He  is unable to stand without bilateral support.  He needs strong bilat support to stand and take a couple steps.  Romberg is positive   Reflexes: Deep tendon reflexes are increased with spread at the knees.  He has clonus (nonsustained) at the ankles       ____________________________________________  MS (multiple sclerosis) (HCC) - Plan: For home use only DME standard manual wheelchair with seat cushion  Spastic hemiplegia of left nondominant side due to noncerebrovascular etiology (HCC)  Impaired mobility and ADLs  High risk medication use  Neurogenic bladder  Indwelling Foley catheter present  Dysesthesia  Spastic diplegia (HCC) - Plan: For home use only DME standard manual wheelchair with seat cushion   1.  He will continue Tysabri     If converts to JCV Ab positive  or if transpotation becomeds more of a problem, consider change to an anti-CD20 agent 2.   Change gabapentin  to lamotrigine  for dysesthesia.  Resume baclofen . 3.   Stay active.   Exercise as tolerated 4.   Return in 6 months or sooner if new or worsening neurologic symptoms.    40-minute office visit with the majority of the time spent face-to-face for history and physical, discussion/counseling and decision-making.  Additional time with record review and documentation.  This visit is part of a comprehensive longitudinal care medical relationship regarding the patients primary diagnosis of multiple sclerosis and related concerns.   Constanza Mincy A. Vear, MD, PhD 01/30/2024, 4:34 PM Certified in Neurology, Clinical Neurophysiology, Sleep Medicine, Pain Medicine and Neuroimaging  [facility] [address] [place], [place] [address] [phone]

## 2024-01-30 NOTE — Patient Instructions (Signed)
 The pharmacy has the prescription for lamotrigine  100 mg tablets. For 5 days, just take one half pill a day. For the next 5 days, take one half pill twice a day. For the next 5 days, take one half pill  in the morning and 1 pill at night Then start taking one pill twice a day from this point on.     If you get a rash, need to stop the medication and not take it again.

## 2024-01-31 DIAGNOSIS — G35 Multiple sclerosis: Secondary | ICD-10-CM | POA: Diagnosis not present

## 2024-02-01 DIAGNOSIS — G35 Multiple sclerosis: Secondary | ICD-10-CM | POA: Diagnosis not present

## 2024-02-04 DIAGNOSIS — G35 Multiple sclerosis: Secondary | ICD-10-CM | POA: Diagnosis not present

## 2024-02-04 NOTE — Progress Notes (Signed)
 Complex Care Management Care Guide Note  02/04/2024 Name: [name] MRN: [id number] DOB: 28-Apr-1981  Bradley Brandt is a 43 y.o. year old male who is a primary care patient of Paseda, Folashade R, FNP and is actively engaged with the care management team. I reached out to Ozell MARLA Grippe by phone today to assist with re-scheduling  with the RN Case Manager.  Follow up plan: Telephone appointment with complex care management team member scheduled for:  02/07/24  Harlene Satterfield  [facility]  [facility] Guide  Direct Dial: [phone]  Fax [phone]

## 2024-02-05 ENCOUNTER — Telehealth: Payer: Self-pay | Admitting: Neurology

## 2024-02-05 DIAGNOSIS — R261 Paralytic gait: Secondary | ICD-10-CM

## 2024-02-05 DIAGNOSIS — G8114 Spastic hemiplegia affecting left nondominant side: Secondary | ICD-10-CM

## 2024-02-05 DIAGNOSIS — G801 Spastic diplegic cerebral palsy: Secondary | ICD-10-CM

## 2024-02-05 DIAGNOSIS — G35 Multiple sclerosis: Secondary | ICD-10-CM | POA: Diagnosis not present

## 2024-02-05 DIAGNOSIS — R269 Unspecified abnormalities of gait and mobility: Secondary | ICD-10-CM

## 2024-02-05 DIAGNOSIS — Z7409 Other reduced mobility: Secondary | ICD-10-CM

## 2024-02-05 NOTE — Telephone Encounter (Signed)
 Pt sister (on [place]) came I into office. States they have misplaced the letter Dr. Vear wrote up for them at appt 8/13 for pt to get a wheelchair. And would like a copy. Nurses all unavailable, Let pt sister know someone would give her a call if able to pick up the letter.

## 2024-02-05 NOTE — Telephone Encounter (Signed)
 I do not see letter in EPIC. Per last note Dr. Vear mentioned:DME standard manual wheelchair with seat cushion   He placed order 01/30/24. I reprinted, pending MD signature

## 2024-02-06 DIAGNOSIS — G35 Multiple sclerosis: Secondary | ICD-10-CM | POA: Diagnosis not present

## 2024-02-06 NOTE — Telephone Encounter (Signed)
 Called sister, Rosaline at [phone]. Relayed rx ready for pick up. She will come today to pick up. Placed up front for pick up.

## 2024-02-07 ENCOUNTER — Other Ambulatory Visit: Payer: Self-pay

## 2024-02-07 ENCOUNTER — Telehealth

## 2024-02-07 DIAGNOSIS — G35 Multiple sclerosis: Secondary | ICD-10-CM | POA: Diagnosis not present

## 2024-02-07 NOTE — Patient Instructions (Signed)
 Visit Information  Bradley Brandt was given information about Medicaid Managed Care team care coordination services as a part of their [facility] Community Plan Medicaid benefit.   If you would like to schedule transportation through your [facility], please call the following number at least 2 days in advance of your appointment: [phone]   Rides for urgent appointments can also be made after hours by calling Member Services.  Call the Behavioral Health Crisis Line at [phone], at any time, 24 hours a day, 7 days a week. If you are in danger or need immediate medical attention call 911.   Bradley Brandt - following are the goals we discussed in your visit today:   Goals Addressed             This Visit's Progress    VBCI RN Care Plan   On track    Problems:  Chronic Disease Management support and education needs related to multiple sclerosis  Goal: Over the next 30 days the Patient will attend all scheduled medical appointments: urology and PCP as evidenced by completed visit notes uploaded to EMR        demonstrate Ongoing adherence to prescribed treatment plan for Multiple Sclerosis as evidenced by patient report of decreased symptoms of muscle spasms and pain  demonstrate Improved health management independence as evidenced by patient report of no falls        take all medications exactly as prescribed and will call provider for medication related questions as evidenced by patient report of medication compliance    work with urology to manage recurrent UTI as evidenced by review of electronic medical record and patient or care team member report    Interventions:   Falls Interventions: Advised patient of importance of notifying provider of falls   Evaluation of current treatment plan related to multiple sclerosis self-management and patient's adherence to plan as established by provider. Discussed plans with patient for ongoing care management follow  up and provided patient with direct contact information for care management team Evaluation of current treatment plan related to multiple sclerosis and patient's adherence to plan as established by provider Advised patient to contact urology to report UTI symptoms Reviewed medications with patient and discussed importance of medication compliance Advised patient to discuss weight loss with provider Screening for signs and symptoms of depression related to chronic disease state  Assessed social determinant of health barriers Advised patient to schedule an appointment with PCP, as there is a missed visit noted on chart review  Patient Self-Care Activities:  Attend all scheduled provider appointments Call provider office for new concerns or questions  Take medications as prescribed   Get wheelchair as ordered by neurology  Plan:  Telephone follow up appointment with care management team member scheduled for:  02/22/24 at 1:30 PM             Patient verbalizes understanding of instructions and care plan provided today and agrees to view in MyChart. Active MyChart status and patient understanding of how to access instructions and care plan via MyChart confirmed with patient.     Telephone follow up appointment with Managed Medicaid care management team member scheduled for: 02/22/24 at 1:30 PM  Rosaline Finlay, RN MSN [place]  [facility] RN Care Manager Direct Dial: [phone]  Fax: [phone]   Following is a copy of your plan of care:  There are no care plans that you recently modified to display for this patient.

## 2024-02-07 NOTE — Patient Outreach (Signed)
 Complex Care Management   Visit Note  02/07/2024  Name:  Bradley Brandt MRN: [id number] DOB: 04/16/1981  Situation: Referral received for Complex Care Management related to multiple sclerosis I obtained verbal consent from Patient.  Visit completed with Patient  on the phone. Patient's sister is present for visit and contributes to conversation.  Background:   Past Medical History:  Diagnosis Date   Anemia    Anxiety    Bipolar 1 disorder (HCC)    Bipolar disorder, unspecified (HCC) 01/03/2013   MS (multiple sclerosis) (HCC) dx 2006   relapsing-remitting right sided weakness   Neuromuscular disorder (HCC)    MS   Pneumonia    Right spastic hemiplegia (HCC)    Substance abuse (HCC)    Suprapubic catheter (HCC)     Assessment: Patient Reported Symptoms:  Cognitive Cognitive Status: Able to follow simple commands, Difficulties with attention and concentration, Requires Assistance Decision Making, Struggling with memory recall Cognitive/Intellectual Conditions Management [RPT]: None reported or documented in medical history or problem list      Neurological Neurological Review of Symptoms: Other: Oher Neurological Symptoms/Conditions [RPT]: Muscle spasms in legs making it hard to walk r/t multiple sclerosis Neurological Management Strategies: Routine screening, Medical device Neurological Comment: Patient was seen by neurology 01/30/24 and medication adjustments were made. Patient reports that pain has improved with medication changes.  HEENT HEENT Symptoms Reported: Other:      Cardiovascular Cardiovascular Symptoms Reported: No symptoms reported Does patient have uncontrolled Hypertension?: No Cardiovascular Management Strategies: Routine screening  Respiratory Respiratory Symptoms Reported: Dry cough Additional Respiratory Details: Patient reports smoking about 1 PPD. Tobacco cessation discussed and encouraged. Respiratory Management Strategies: Coping strategies   Endocrine Endocrine Symptoms Reported: No symptoms reported Is patient diabetic?: No    Gastrointestinal Gastrointestinal Symptoms Reported: Unintentional weight loss Additional Gastrointestinal Details: Patient reports a good appetite. Last BM this morning. Unintentional weight loss of 25-45 lbs over the past 7 months. Encouraged to make appointment with PCP to assess further. Gastrointestinal Management Strategies: Incontinence garment/pad (Brief)    Genitourinary Genitourinary Symptoms Reported: Malodorous urine, Other Other Genitourinary Symptoms: Cloudy urine, pain at cathter insertion site. Advised calling urology office to report symptoms. Additional Genitourinary Details: Patient and his sister note issues with recurrent UTI. Denies fever, chills/shaking. Genitourinary Management Strategies: Medical device Genitourinary Comment: Suprapubic catheter in place, managed by [facility]. Patient denies issues getting supplies. Next f/u with urologist is next month for catheter exchange.  Integumentary Integumentary Symptoms Reported: No symptoms reported    Musculoskeletal Musculoskelatal Symptoms Reviewed: Difficulty walking, Unsteady gait Additional Musculoskeletal Details: Neurology has written an order for wheelchair. Patient's sister does have prescription. Musculoskeletal Management Strategies: Medical device, Medication therapy, Coping strategies, Adequate rest Musculoskeletal Comment: Patient does not do any regular exercise. He has previously done PT, but was told he needed to stop smoking cigarettes to continue. Falls in the past year?: Yes Number of falls in past year: 2 or more Was there an injury with Fall?: No Fall Risk Category Calculator: 2 Patient Fall Risk Level: Moderate Fall Risk Patient at Risk for Falls Due to: History of fall(s), Impaired balance/gait Fall risk Follow up: Falls evaluation completed, Education provided, Falls prevention discussed  Psychosocial  Psychosocial Symptoms Reported: Anxiety - if selected complete GAD Additional Psychological Details: Patient reports he is sleeping well. He previously worked with pysch but is not currently working with behavioral health provider. Patient declines referral at this time. Behavioral Management Strategies: Support system   Quality of Family  Relationships: helpful, involved, supportive Do you feel physically threatened by others?: No    02/07/2024    PHQ2-9 Depression Screening   Little interest or pleasure in doing things Not at all  Feeling down, depressed, or hopeless Not at all  PHQ-2 - Total Score 0  Trouble falling or staying asleep, or sleeping too much    Feeling tired or having little energy    Poor appetite or overeating     Feeling bad about yourself - or that you are a failure or have let yourself or your family down    Trouble concentrating on things, such as reading the newspaper or watching television    Moving or speaking so slowly that other people could have noticed.  Or the opposite - being so fidgety or restless that you have been moving around a lot more than usual    Thoughts that you would be better off dead, or hurting yourself in some way    PHQ2-9 Total Score    If you checked off any problems, how difficult have these problems made it for you to do your work, take care of things at home, or get along with other people    Depression Interventions/Treatment      There were no vitals filed for this visit.  Medications Reviewed Today     Reviewed by Arno Rosaline SQUIBB, RN (Registered Nurse) on 02/07/24 at 1357  Med List Status: <None>   Medication Order Taking? Sig Documenting Provider Last Dose Status Informant  0.9 %  sodium chloride  infusion [id number]   Vear Charlie LABOR, MD  Active   acetaminophen  (TYLENOL ) tablet 650 mg [id number]   Vear Charlie LABOR, MD  Active   baclofen  (LIORESAL ) 10 MG tablet [id number] Yes Take 1 tablet (10 mg total) by mouth 3 (three)  times daily. Sater, Charlie LABOR, MD  Active   DULoxetine  (CYMBALTA ) 30 MG capsule [id number]  Take 1 capsule (30 mg total) by mouth daily.  Patient not taking: Reported on 02/07/2024   Paseda, Folashade R, FNP  Active Self  lamoTRIgine  (LAMICTAL ) 100 MG tablet [id number] Yes Take 1 tablet (100 mg total) by mouth 2 (two) times daily. For 5 days, take 1/2 pill daily, for next 5 days take 1/2 pill tice a day, for next 5 days, take 1/2 oill in the morning and 1 pill at night   then take 1 pill twice a day Sater, Charlie LABOR, MD  Active   loratadine  (CLARITIN ) tablet 10 mg [id number]   Vear Charlie LABOR, MD  Active   natalizumab  (TYSABRI ) 300 MG/15ML injection [id number] Yes Inject 15 mLs (300 mg total) into the vein every 28 (twenty-eight) days. Please fax to [phone]. Sater, Charlie LABOR, MD  Active   oxybutynin  (DITROPAN -XL) 10 MG 24 hr tablet [id number] Yes Take 1 tablet (10 mg total) by mouth daily. Paseda, Folashade R, FNP  Active Self  Vitamin D , Ergocalciferol , (DRISDOL ) 1.25 MG (50000 UNIT) CAPS capsule [id number]  Take 1 capsule (50,000 Units total) by mouth every 7 (seven) days.  Patient not taking: Reported on 02/07/2024   Vear Charlie LABOR, MD  Active Self            Recommendation:   PCP Follow-up Specialty provider follow-up urology Continue Current Plan of Care  Follow Up Plan:   Telephone follow up appointment date/time:  02/22/24 at 1:30 PM  Rosaline Arno, RN MSN [place]  [facility] RN Care Manager Direct Dial: [phone]  Fax: [phone]

## 2024-02-08 DIAGNOSIS — G35 Multiple sclerosis: Secondary | ICD-10-CM | POA: Diagnosis not present

## 2024-02-11 DIAGNOSIS — G35 Multiple sclerosis: Secondary | ICD-10-CM | POA: Diagnosis not present

## 2024-02-12 DIAGNOSIS — G35 Multiple sclerosis: Secondary | ICD-10-CM | POA: Diagnosis not present

## 2024-02-13 DIAGNOSIS — G35 Multiple sclerosis: Secondary | ICD-10-CM | POA: Diagnosis not present

## 2024-02-14 DIAGNOSIS — G35 Multiple sclerosis: Secondary | ICD-10-CM | POA: Diagnosis not present

## 2024-02-14 DIAGNOSIS — G959 Disease of spinal cord, unspecified: Secondary | ICD-10-CM | POA: Diagnosis not present

## 2024-02-14 DIAGNOSIS — G709 Myoneural disorder, unspecified: Secondary | ICD-10-CM | POA: Diagnosis not present

## 2024-02-14 DIAGNOSIS — R32 Unspecified urinary incontinence: Secondary | ICD-10-CM | POA: Diagnosis not present

## 2024-02-14 DIAGNOSIS — R269 Unspecified abnormalities of gait and mobility: Secondary | ICD-10-CM | POA: Diagnosis not present

## 2024-02-15 DIAGNOSIS — G35 Multiple sclerosis: Secondary | ICD-10-CM | POA: Diagnosis not present

## 2024-02-18 DIAGNOSIS — G959 Disease of spinal cord, unspecified: Secondary | ICD-10-CM | POA: Diagnosis not present

## 2024-02-18 DIAGNOSIS — G35 Multiple sclerosis: Secondary | ICD-10-CM | POA: Diagnosis not present

## 2024-02-18 DIAGNOSIS — G709 Myoneural disorder, unspecified: Secondary | ICD-10-CM | POA: Diagnosis not present

## 2024-02-18 DIAGNOSIS — R269 Unspecified abnormalities of gait and mobility: Secondary | ICD-10-CM | POA: Diagnosis not present

## 2024-02-19 DIAGNOSIS — G35 Multiple sclerosis: Secondary | ICD-10-CM | POA: Diagnosis not present

## 2024-02-20 DIAGNOSIS — G35 Multiple sclerosis: Secondary | ICD-10-CM | POA: Diagnosis not present

## 2024-02-21 DIAGNOSIS — R338 Other retention of urine: Secondary | ICD-10-CM | POA: Diagnosis not present

## 2024-02-22 ENCOUNTER — Telehealth: Payer: Self-pay

## 2024-02-22 DIAGNOSIS — G35 Multiple sclerosis: Secondary | ICD-10-CM | POA: Diagnosis not present

## 2024-02-22 NOTE — Patient Outreach (Signed)
 Care Coordination   02/22/2024 Name: Bradley Brandt MRN: [id number] DOB: [birth year]   Care Coordination Outreach Attempts:  An unsuccessful outreach was attempted for an appointment today.  Follow Up Plan:  Additional outreach attempts will be made to complete CCM follow-up visit.   Encounter Outcome:  No Answer. HIPAA compliant voicemail left requesting return call.   Rosaline Finlay, RN MSN [place]  [facility] RN Care Manager Direct Dial: [phone]  Fax: [phone]

## 2024-02-25 DIAGNOSIS — G35 Multiple sclerosis: Secondary | ICD-10-CM | POA: Diagnosis not present

## 2024-02-26 ENCOUNTER — Ambulatory Visit

## 2024-02-26 DIAGNOSIS — G35 Multiple sclerosis: Secondary | ICD-10-CM | POA: Diagnosis not present

## 2024-02-26 MED ORDER — LORATADINE 10 MG PO TABS: 10.0000 mg | ORAL_TABLET | Freq: Once | ORAL | Status: DC

## 2024-02-26 MED ORDER — ACETAMINOPHEN 325 MG PO TABS: 650.0000 mg | ORAL_TABLET | Freq: Once | ORAL | Status: DC

## 2024-02-27 ENCOUNTER — Other Ambulatory Visit: Payer: Self-pay

## 2024-02-27 DIAGNOSIS — G35 Multiple sclerosis: Secondary | ICD-10-CM | POA: Diagnosis not present

## 2024-02-27 NOTE — Patient Outreach (Signed)
 Called and spoke with patient's sister, Rosaline. She reports patient is doing well and offered to give the phone to him for [facility] to complete follow-up visit. Patient requests that CMRN calls back later this afternoon. Arranged for [facility] to call at 1:30 PM to complete follow-up visit.  Attempted x3 at 1:30 PM using three listed phone numbers in patient's chart. No answer on any line, message left requesting return call. Will continue with efforts to complete CCM follow-up visit.  Rosaline Finlay, RN MSN [place]  [facility] RN Care Manager Direct Dial: [phone]  Fax: [phone]

## 2024-03-01 DIAGNOSIS — G35 Multiple sclerosis: Secondary | ICD-10-CM | POA: Diagnosis not present

## 2024-03-02 DIAGNOSIS — G35 Multiple sclerosis: Secondary | ICD-10-CM | POA: Diagnosis not present

## 2024-03-03 ENCOUNTER — Other Ambulatory Visit: Payer: Self-pay

## 2024-03-03 ENCOUNTER — Encounter (HOSPITAL_COMMUNITY): Payer: Self-pay

## 2024-03-03 ENCOUNTER — Encounter (HOSPITAL_COMMUNITY)
Admission: RE | Admit: 2024-03-03 | Discharge: 2024-03-03 | Disposition: A | Discharge status: Home / Self Care | Source: Ambulatory Visit | Attending: Urology | Admitting: Urology

## 2024-03-03 VITALS — BP 108/76 | HR 58 | Temp 97.5°F | Resp 16 | Ht 71.0 in | Wt 170.0 lb

## 2024-03-03 DIAGNOSIS — Z01812 Encounter for preprocedural laboratory examination: Secondary | ICD-10-CM | POA: Diagnosis not present

## 2024-03-03 DIAGNOSIS — Z01818 Encounter for other preprocedural examination: Secondary | ICD-10-CM | POA: Diagnosis present

## 2024-03-03 DIAGNOSIS — G35 Multiple sclerosis: Secondary | ICD-10-CM | POA: Insufficient documentation

## 2024-03-03 LAB — CBC
HCT: 42.6 % (ref 39.0–52.0)
Hemoglobin: 13.8 g/dL (ref 13.0–17.0)
MCH: 30.1 pg (ref 26.0–34.0)
MCHC: 32.4 g/dL (ref 30.0–36.0)
MCV: 93 fL (ref 80.0–100.0)
Platelets: 212 K/uL (ref 150–400)
RBC: 4.58 MIL/uL (ref 4.22–5.81)
RDW: 16.4 % — ABNORMAL HIGH (ref 11.5–15.5)
WBC: 7.3 K/uL (ref 4.0–10.5)
nRBC: 0.5 % — ABNORMAL HIGH (ref 0.0–0.2)

## 2024-03-03 LAB — BASIC METABOLIC PANEL WITH GFR
Anion gap: 10 (ref 5–15)
BUN: 14 mg/dL (ref 6–20)
CO2: 26 mmol/L (ref 22–32)
Calcium: 9.9 mg/dL (ref 8.9–10.3)
Chloride: 101 mmol/L (ref 98–111)
Creatinine, Ser: 1.11 mg/dL (ref 0.61–1.24)
GFR, Estimated: >60 mL/min (ref >60)
Glucose, Bld: 117 mg/dL — ABNORMAL HIGH (ref 70–99)
Potassium: 4.3 mmol/L (ref 3.5–5.1)
Sodium: 138 mmol/L (ref 135–145)

## 2024-03-03 NOTE — Patient Outreach (Signed)
 Care Coordination   03/03/2024 Name: Bradley Brandt MRN: [id number] DOB: May 13, 1981   Care Coordination Outreach Attempts:  Missed call from patient 02/29/24. Attempted to call patient back to complete CCM follow-up visit.  Follow Up Plan:  Additional outreach attempts will be made to complete follow-up visit.   Encounter Outcome:  No Answer. HIPAA compliant voicemail left requesting return call.   Rosaline Finlay, RN MSN [place]  [facility] RN Care Manager Direct Dial: [phone]  Fax: [phone]

## 2024-03-03 NOTE — Progress Notes (Addendum)
 COVID Vaccine Completed:yes  Date of COVID positive in last 90 days:  PCP - Folashade Paseda, FNP Cardiologist -  Neurologist- Charlie Crete, MD  Chest x-ray - 04/03/23 Epic EKG - N/A Stress Test - N/A ECHO - N/A Cardiac Cath -  Pacemaker/ICD device last checked:N/A Spinal Cord Stimulator:N/A  Bowel Prep - N/A  Sleep Study - N/A CPAP -   Fasting Blood Sugar - N/A Checks Blood Sugar _____ times a day  Last dose of GLP1 agonist-  N/A GLP1 instructions:  Do not take after     Last dose of SGLT-2 inhibitors-  N/A SGLT-2 instructions:  Do not take after     Blood Thinner Instructions: N/A Last dose:   Time: Aspirin Instructions:N/A Last Dose:  Activity level: Can perform activities of daily living without stopping and without symptoms of chest pain or shortness of breath.Patient has MS, spends most of his time in wheelchair. He is able to self transfer from bed to chair. Occasionally ambulates with walker  Anesthesia review: MS, fatty liver  Patient denies shortness of breath, fever, cough and chest pain at PAT appointment  Patient verbalized understanding of instructions that were given to them at the PAT appointment. Patient was also instructed that they will need to review over the PAT instructions again at home before surgery.

## 2024-03-03 NOTE — Patient Instructions (Addendum)
 SURGICAL WAITING ROOM VISITATION  Patients having surgery or a procedure may have no more than 2 support people in the waiting area - these visitors may rotate.    Children under the age of 64 must have an adult with them who is not the patient.  Visitors with respiratory illnesses are discouraged from visiting and should remain at home.  If the patient needs to stay at the hospital during part of their recovery, the visitor guidelines for inpatient rooms apply. Pre-op nurse will coordinate an appropriate time for 1 support person to accompany patient in pre-op.  This support person may not rotate.    Please refer to the [facility] website for the visitor guidelines for Inpatients (after your surgery is over and you are in a regular room).    Your procedure is scheduled on: 03/17/24   Report to [facility] Main Entrance    Report to admitting at 6:45 AM   Call this number if you have problems the morning of surgery [phone]   Do not eat food or drink liquids :After Midnight.          If you have questions, please contact your surgeon's office.   FOLLOW BOWEL PREP AND ANY ADDITIONAL PRE OP INSTRUCTIONS YOU RECEIVED FROM YOUR SURGEON'S OFFICE!!!     Oral Hygiene is also important to reduce your risk of infection.                                    Remember - BRUSH YOUR TEETH THE MORNING OF SURGERY WITH YOUR REGULAR TOOTHPASTE  DENTURES WILL BE REMOVED PRIOR TO SURGERY PLEASE DO NOT APPLY Poly grip OR ADHESIVES!!!   Do NOT smoke after Midnight   Stop all vitamins and herbal supplements 7 days before surgery.   Take these medicines the morning of surgery with A SIP OF WATER : Lamictal , Oxybutynin                               You may not have any metal on your body including jewelry, and body piercing             Do not wear lotions, powders, cologne, or deodorant              Men may shave face and neck.   Do not bring valuables to the hospital. [facility] IS  NOT             RESPONSIBLE   FOR VALUABLES.   Contacts, glasses, dentures or bridgework may not be worn into surgery.  DO NOT BRING YOUR HOME MEDICATIONS TO THE HOSPITAL. PHARMACY WILL DISPENSE MEDICATIONS LISTED ON YOUR MEDICATION LIST TO YOU DURING YOUR ADMISSION IN THE HOSPITAL!    Patients discharged on the day of surgery will not be allowed to drive home.  Someone NEEDS to stay with you for the first 24 hours after anesthesia.   Special Instructions: Bring a copy of your healthcare power of attorney and living will documents the day of surgery if you haven't scanned them before.              Please read over the following fact sheets you were given: IF YOU HAVE QUESTIONS ABOUT YOUR PRE-OP INSTRUCTIONS PLEASE CALL [phone] [place].   If you received a COVID test during your pre-op visit  it is requested that you wear  a mask when out in public, stay away from anyone that may not be feeling well and notify your surgeon if you develop symptoms. If you test positive for Covid or have been in contact with anyone that has tested positive in the last 10 days please notify you surgeon.    [place] - Preparing for Surgery Before surgery, you can play an important role.  Because skin is not sterile, your skin needs to be as free of germs as possible.  You can reduce the number of germs on your skin by washing with CHG (chlorahexidine gluconate) soap before surgery.  CHG is an antiseptic cleaner which kills germs and bonds with the skin to continue killing germs even after washing. Please DO NOT use if you have an allergy to CHG or antibacterial soaps.  If your skin becomes reddened/irritated stop using the CHG and inform your nurse when you arrive at Short Stay. Do not shave (including legs and underarms) for at least 48 hours prior to the first CHG shower.  You may shave your face/neck.  Please follow these instructions carefully:  1.  Shower with CHG Soap the night before surgery and the   morning of surgery.  2.  If you choose to wash your hair, wash your hair first as usual with your normal  shampoo.  3.  After you shampoo, rinse your hair and body thoroughly to remove the shampoo.                             4.  Use CHG as you would any other liquid soap.  You can apply chg directly to the skin and wash.  Gently with a scrungie or clean washcloth.  5.  Apply the CHG Soap to your body ONLY FROM THE NECK DOWN.   Do   not use on face/ open                           Wound or open sores. Avoid contact with eyes, ears mouth and   genitals (private parts).                       Wash face,  Genitals (private parts) with your normal soap.             6.  Wash thoroughly, paying special attention to the area where your    surgery  will be performed.  7.  Thoroughly rinse your body with warm water  from the neck down.  8.  DO NOT shower/wash with your normal soap after using and rinsing off the CHG Soap.                9.  Pat yourself dry with a clean towel.            10.  Wear clean pajamas.            11.  Place clean sheets on your bed the night of your first shower and do not  sleep with pets. Day of Surgery : Do not apply any lotions/deodorants the morning of surgery.  Please wear clean clothes to the hospital/surgery center.  FAILURE TO FOLLOW THESE INSTRUCTIONS MAY RESULT IN THE CANCELLATION OF YOUR SURGERY  PATIENT SIGNATURE_________________________________  NURSE SIGNATURE__________________________________  ________________________________________________________________________

## 2024-03-04 ENCOUNTER — Ambulatory Visit (INDEPENDENT_AMBULATORY_CARE_PROVIDER_SITE_OTHER)

## 2024-03-04 VITALS — BP 102/63 | HR 60 | Temp 98.0°F | Resp 18 | Ht 71.0 in | Wt 176.4 lb

## 2024-03-04 DIAGNOSIS — G35 Multiple sclerosis: Secondary | ICD-10-CM | POA: Diagnosis not present

## 2024-03-04 MED ORDER — LORATADINE 10 MG PO TABS
10.0000 mg | ORAL_TABLET | Freq: Once | ORAL | Status: AC
  Administered 2024-03-04: 10 mg via ORAL
  Filled 2024-03-04: qty 1

## 2024-03-04 MED ORDER — ACETAMINOPHEN 325 MG PO TABS
650.0000 mg | ORAL_TABLET | Freq: Once | ORAL | Status: AC
  Administered 2024-03-04: 650 mg via ORAL
  Filled 2024-03-04: qty 2

## 2024-03-04 MED ORDER — SODIUM CHLORIDE 0.9 % IV SOLN
300.0000 mg | Freq: Once | INTRAVENOUS | Status: AC
  Administered 2024-03-04: 300 mg via INTRAVENOUS
  Filled 2024-03-04: qty 15

## 2024-03-04 NOTE — Progress Notes (Signed)
 Diagnosis: Multiple Sclerosis  Provider:  Lonna Coder MD  Procedure: IV Infusion  IV Type: Peripheral, IV Location: L Hand  Tysabri  (Natalizumab ), Dose: 300 mg  Infusion Start Time: 1204  Infusion Stop Time: 1319  Post Infusion IV Care: Patient declined observation and Peripheral IV Discontinued  Discharge: Condition: Good, Destination: Home . AVS Declined  Performed by:  Avri Paiva, RN

## 2024-03-05 ENCOUNTER — Telehealth: Payer: Self-pay

## 2024-03-05 ENCOUNTER — Other Ambulatory Visit: Payer: Self-pay

## 2024-03-05 DIAGNOSIS — G35 Multiple sclerosis: Secondary | ICD-10-CM | POA: Diagnosis not present

## 2024-03-05 NOTE — Telephone Encounter (Signed)
 Pt gets infused at [facility] who handles their owns PA's for infusions

## 2024-03-05 NOTE — Patient Outreach (Signed)
 Complex Care Management   Visit Note  03/05/2024  Name:  Bradley Brandt MRN: [id number] DOB: 03/06/1981  Situation: Referral received for Complex Care Management related to multiple sclerosis I obtained verbal consent from Patient.  Visit completed with Patient  on the phone  Background:   Past Medical History:  Diagnosis Date   Anemia    Anxiety    Bipolar 1 disorder (HCC)    Bipolar disorder, unspecified (HCC) 01/03/2013   MS (multiple sclerosis) (HCC) dx 2006   relapsing-remitting right sided weakness   Neuromuscular disorder (HCC)    MS   Pneumonia    Right spastic hemiplegia (HCC)    Substance abuse (HCC)    Suprapubic catheter (HCC)     Assessment: Patient Reported Symptoms:  Cognitive Cognitive Status: Able to follow simple commands, Alert and oriented to person, place, and time, Requires Assistance Decision Making, Struggling with memory recall Cognitive/Intellectual Conditions Management [RPT]: None reported or documented in medical history or problem list      Neurological Neurological Review of Symptoms: Other: Oher Neurological Symptoms/Conditions [RPT]: Muscle spasms in legs r/t multiple sclerosis. Patient reports spasms are no more than they usually are Neurological Management Strategies: Routine screening, Medical device  HEENT HEENT Symptoms Reported: Not assessed      Cardiovascular Cardiovascular Symptoms Reported: No symptoms reported Does patient have uncontrolled Hypertension?: No Cardiovascular Management Strategies: Routine screening  Respiratory Respiratory Symptoms Reported: Dry cough Additional Respiratory Details: Patient reports he continues to smoke about 1 PPD. He reports he is trying to cut back. Respiratory Management Strategies: Coping strategies  Endocrine Endocrine Symptoms Reported: Not assessed    Gastrointestinal Gastrointestinal Symptoms Reported: No symptoms reported Additional Gastrointestinal Details: Patient continues to  report a good appetite and regular BMs. Reminded to reach out to PCP to evaluate any further weight loss. Gastrointestinal Management Strategies: Incontinence garment/pad (Brief)    Genitourinary Genitourinary Symptoms Reported: Other Other Genitourinary Symptoms: Patient report pain at catheter insertion site continues. He denies redness or drainage around insertion site. Additional Genitourinary Details: Patient reports his urine is clear. Genitourinary Management Strategies: Medical device Genitourinary Comment: Suprapubic catheter in plce, managed by [facility]. Patient is requesting a leg strap for catheter. Call placed to urology office to request. Next f/u with urologist is 03/26/24 for catheter exchange.  Integumentary Integumentary Symptoms Reported: No symptoms reported    Musculoskeletal Musculoskelatal Symptoms Reviewed: Difficulty walking, Unsteady gait Additional Musculoskeletal Details: Patient reports they were able to get wheelchair and it is working well for him. Musculoskeletal Management Strategies: Medical device, Medication therapy, Coping strategies, Adequate rest Musculoskeletal Comment: No falls since previous CMRN visit Falls in the past year?: Yes Number of falls in past year: 2 or more Was there an injury with Fall?: No Fall Risk Category Calculator: 2 Patient Fall Risk Level: Moderate Fall Risk Patient at Risk for Falls Due to: History of fall(s), Impaired balance/gait Fall risk Follow up: Falls evaluation completed, Education provided, Falls prevention discussed  Psychosocial Psychosocial Symptoms Reported: Anxiety - if selected complete GAD Additional Psychological Details: Patient reports anxiety seems to be stable at this time.          03/05/2024    PHQ2-9 Depression Screening   Little interest or pleasure in doing things    Feeling down, depressed, or hopeless    PHQ-2 - Total Score    Trouble falling or staying asleep, or sleeping too much     Feeling tired or having little energy    Poor appetite  or overeating     Feeling bad about yourself - or that you are a failure or have let yourself or your family down    Trouble concentrating on things, such as reading the newspaper or watching television    Moving or speaking so slowly that other people could have noticed.  Or the opposite - being so fidgety or restless that you have been moving around a lot more than usual    Thoughts that you would be better off dead, or hurting yourself in some way    PHQ2-9 Total Score    If you checked off any problems, how difficult have these problems made it for you to do your work, take care of things at home, or get along with other people    Depression Interventions/Treatment      There were no vitals filed for this visit.  Medications Reviewed Today     Reviewed by Arno Rosaline SQUIBB, RN (Registered Nurse) on 03/05/24 at 1331  Med List Status: <None>   Medication Order Taking? Sig Documenting Provider Last Dose Status Informant  0.9 %  sodium chloride  infusion [id number]   Sater, Charlie LABOR, MD  Active   acetaminophen  (TYLENOL ) tablet 650 mg [id number]   Vear Charlie LABOR, MD  Active   acetaminophen  (TYLENOL ) tablet 650 mg [id number]   Vear Charlie LABOR, MD  Active   baclofen  (LIORESAL ) 10 MG tablet [id number]  Take 1 tablet (10 mg total) by mouth 3 (three) times daily.  Patient taking differently: Take 10 mg by mouth 4 (four) times daily.   Sater, Charlie LABOR, MD  Active Self, Pharmacy Records, Multiple Informants  lamoTRIgine  (LAMICTAL ) 100 MG tablet [id number]  Take 1 tablet (100 mg total) by mouth 2 (two) times daily. For 5 days, take 1/2 pill daily, for next 5 days take 1/2 pill tice a day, for next 5 days, take 1/2 oill in the morning and 1 pill at night   then take 1 pill twice a day Sater, Charlie LABOR, MD  Active Self, Pharmacy Records, Multiple Informants           Med Note STEFFI ADELITA Kitchens Mar 03, 2024 11:33 AM) Just started    loratadine  (CLARITIN ) tablet 10 mg [id number]   Vear Charlie LABOR, MD  Active   loratadine  (CLARITIN ) tablet 10 mg [id number]   Vear Charlie LABOR, MD  Active   natalizumab  (TYSABRI ) 300 MG/15ML injection [id number] No Inject 15 mLs (300 mg total) into the vein every 28 (twenty-eight) days. Please fax to [phone]. Vear Charlie LABOR, MD 01/18/2024 Active Self, Pharmacy Records, Multiple Informants  oxybutynin  (DITROPAN -XL) 10 MG 24 hr tablet [id number]  Take 1 tablet (10 mg total) by mouth daily. Paseda, Folashade R, FNP  Active Self, Pharmacy Records, Multiple Informants  Vitamin D , Ergocalciferol , (DRISDOL ) 1.25 MG (50000 UNIT) CAPS capsule [id number] No Take 1 capsule (50,000 Units total) by mouth every 7 (seven) days.  Patient not taking: Reported on 08/22/2023   Vear Charlie LABOR, MD Not Taking Active Self, Pharmacy Records, Multiple Informants           Med Note STEFFI ADELITA Kitchens Mar 03, 2024 11:44 AM) Patient is requesting a refill            Recommendation:   PCP Follow-up Continue Current Plan of Care  Follow Up Plan:   Telephone follow up appointment date/time:  03/24/24 at 1:30 PM  Rosaline Arno, RN MSN [place]  [facility] Population Health RN  Care Manager Direct Dial: [phone]  Fax: [phone]

## 2024-03-05 NOTE — Telephone Encounter (Signed)
 Unclear if patient has an active PA on file for Tysabri .

## 2024-03-05 NOTE — Patient Instructions (Signed)
 Visit Information  Mr. Segundo was given information about Medicaid Managed Care team care coordination services as a part of their [facility] Community Plan Medicaid benefit.   If you would like to schedule transportation through your [facility], please call the following number at least 2 days in advance of your appointment: [phone]   Rides for urgent appointments can also be made after hours by calling Member Services.  Call the Behavioral Health Crisis Line at [phone], at any time, 24 hours a day, 7 days a week. If you are in danger or need immediate medical attention call 911.   Mr. Tracz - following are the goals we discussed in your visit today:   Goals Addressed             This Visit's Progress    VBCI RN Care Plan   On track    Problems:  Chronic Disease Management support and education needs related to multiple sclerosis  Goal: Over the next 30 days the Patient will attend all scheduled medical appointments: urology as evidenced by completed visit notes uploaded to EMR        demonstrate Ongoing adherence to prescribed treatment plan for Multiple Sclerosis as evidenced by patient report of decreased symptoms of muscle spasms and pain  demonstrate Improved health management independence as evidenced by patient report of no falls        take all medications exactly as prescribed and will call provider for medication related questions as evidenced by patient report of medication compliance    work with urology to manage recurrent UTI as evidenced by review of electronic medical record and patient or care team member report    Interventions:   Falls Interventions: Advised patient of importance of notifying provider of falls Ensured patient has gotten wheelchair as ordered   Evaluation of current treatment plan related to multiple sclerosis self-management and patient's adherence to plan as established by provider. Discussed plans with  patient for ongoing care management follow up and provided patient with direct contact information for care management team Evaluation of current treatment plan related to multiple sclerosis and patient's adherence to plan as established by provider Reviewed medications with patient and discussed importance of medication compliance Advised patient to discuss weight loss with provider Reminded patient to schedule an appointment with PCP, as there is a missed visit noted on chart review Call placed to [facility] to request leg strap. Heather, RN, states she will leave a leg strap at the front desk for patient to pick up. Called and left voicemail for patient to make aware.  Patient Self-Care Activities:  Attend all scheduled provider appointments Call provider office for new concerns or questions  Take medications as prescribed   Pick up leg strap from urology office  Plan:  Telephone follow up appointment with care management team member scheduled for:  03/24/24 at 1:30 PM             Patient verbalizes understanding of instructions and care plan provided today and agrees to view in MyChart. Active MyChart status and patient understanding of how to access instructions and care plan via MyChart confirmed with patient.     Patient                                              will pick up leg strap  from front desk at [facility] at or before next follow-up appointment. Telephone follow up appointment with Managed Medicaid care management team member scheduled for: 03/24/24 at 1:30 PM  Rosaline Finlay, RN MSN [place]  [facility] RN Care Manager Direct Dial: [phone]  Fax: [phone]   Following is a copy of your plan of care:  There are no care plans that you recently modified to display for this patient.

## 2024-03-06 DIAGNOSIS — H468 Other optic neuritis: Secondary | ICD-10-CM | POA: Diagnosis not present

## 2024-03-06 DIAGNOSIS — H2513 Age-related nuclear cataract, bilateral: Secondary | ICD-10-CM | POA: Diagnosis not present

## 2024-03-06 DIAGNOSIS — G35 Multiple sclerosis: Secondary | ICD-10-CM | POA: Diagnosis not present

## 2024-03-06 DIAGNOSIS — H1045 Other chronic allergic conjunctivitis: Secondary | ICD-10-CM | POA: Diagnosis not present

## 2024-03-06 DIAGNOSIS — H31091 Other chorioretinal scars, right eye: Secondary | ICD-10-CM | POA: Diagnosis not present

## 2024-03-06 DIAGNOSIS — H40023 Open angle with borderline findings, high risk, bilateral: Secondary | ICD-10-CM | POA: Diagnosis not present

## 2024-03-07 DIAGNOSIS — G35 Multiple sclerosis: Secondary | ICD-10-CM | POA: Diagnosis not present

## 2024-03-10 DIAGNOSIS — G35 Multiple sclerosis: Secondary | ICD-10-CM | POA: Diagnosis not present

## 2024-03-11 DIAGNOSIS — G35 Multiple sclerosis: Secondary | ICD-10-CM | POA: Diagnosis not present

## 2024-03-12 ENCOUNTER — Encounter (HOSPITAL_COMMUNITY): Payer: Self-pay | Admitting: Emergency Medicine

## 2024-03-12 ENCOUNTER — Other Ambulatory Visit: Payer: Self-pay

## 2024-03-12 ENCOUNTER — Emergency Department (HOSPITAL_COMMUNITY)
Admission: EM | Admit: 2024-03-12 | Discharge: 2024-03-12 | Disposition: A | Discharge status: Home / Self Care | Attending: Emergency Medicine | Admitting: Emergency Medicine

## 2024-03-12 DIAGNOSIS — N3289 Other specified disorders of bladder: Secondary | ICD-10-CM

## 2024-03-12 DIAGNOSIS — R1030 Lower abdominal pain, unspecified: Secondary | ICD-10-CM | POA: Insufficient documentation

## 2024-03-12 DIAGNOSIS — R102 Pelvic and perineal pain: Secondary | ICD-10-CM

## 2024-03-12 LAB — CBC
HCT: 47 % (ref 39.0–52.0)
Hemoglobin: 14.9 g/dL (ref 13.0–17.0)
MCH: 29.8 pg (ref 26.0–34.0)
MCHC: 31.7 g/dL (ref 30.0–36.0)
MCV: 94 fL (ref 80.0–100.0)
Platelets: 205 K/uL (ref 150–400)
RBC: 5 MIL/uL (ref 4.22–5.81)
RDW: 16.1 % — ABNORMAL HIGH (ref 11.5–15.5)
WBC: 8.8 K/uL (ref 4.0–10.5)
nRBC: 0.3 % — ABNORMAL HIGH (ref 0.0–0.2)

## 2024-03-12 LAB — URINALYSIS, ROUTINE W REFLEX MICROSCOPIC
Bilirubin Urine: NEGATIVE
Glucose, UA: NEGATIVE mg/dL
Ketones, ur: NEGATIVE mg/dL
Nitrite: POSITIVE — AB
Protein, ur: 100 mg/dL — AB
Specific Gravity, Urine: 1.015 (ref 1.005–1.030)
WBC, UA: >50 WBC/hpf (ref 0–5)
pH: 6 (ref 5.0–8.0)

## 2024-03-12 LAB — BASIC METABOLIC PANEL WITH GFR
Anion gap: 13 (ref 5–15)
BUN: 17 mg/dL (ref 6–20)
CO2: 20 mmol/L — ABNORMAL LOW (ref 22–32)
Calcium: 9.7 mg/dL (ref 8.9–10.3)
Chloride: 105 mmol/L (ref 98–111)
Creatinine, Ser: 1.12 mg/dL (ref 0.61–1.24)
GFR, Estimated: >60 mL/min (ref >60)
Glucose, Bld: 84 mg/dL (ref 70–99)
Potassium: 4.3 mmol/L (ref 3.5–5.1)
Sodium: 138 mmol/L (ref 135–145)

## 2024-03-12 MED ORDER — OXYCODONE HCL 5 MG PO TABS
5.0000 mg | ORAL_TABLET | Freq: Once | ORAL | Status: AC
  Administered 2024-03-12: 5 mg via ORAL
  Filled 2024-03-12: qty 1

## 2024-03-12 MED ORDER — LIDOCAINE HCL URETHRAL/MUCOSAL 2 % EX GEL
1.0000 | Freq: Once | CUTANEOUS | Status: AC
  Administered 2024-03-12: 1 via TOPICAL
  Filled 2024-03-12: qty 11

## 2024-03-12 MED ORDER — DIAZEPAM 2 MG PO TABS
2.0000 mg | ORAL_TABLET | Freq: Once | ORAL | Status: AC
  Administered 2024-03-12: 2 mg via ORAL
  Filled 2024-03-12: qty 1

## 2024-03-12 MED ORDER — DIAZEPAM 2 MG PO TABS: 2.0000 mg | ORAL_TABLET | Freq: Four times a day (QID) | ORAL | 0 refills | Status: DC | PRN

## 2024-03-12 MED ORDER — OXYCODONE HCL 5 MG PO TABS: 2.5000 mg | ORAL_TABLET | Freq: Four times a day (QID) | ORAL | 0 refills | Status: DC | PRN

## 2024-03-12 NOTE — ED Triage Notes (Signed)
 Patient c/o Suprapubic catheter pain started last night. Patient report urine leaks on his penis. Patient report he had some urine output on his leg bag. Patient denies abdominal pain.

## 2024-03-12 NOTE — ED Notes (Signed)
 Pt demanding to leave at this time, and has removed his IV. PT states that his sister is on the way to pick him up.

## 2024-03-12 NOTE — Discharge Instructions (Signed)
 You are seen in the emergency department for suprapubic pain.  It is suspected that you are having bladder spasms.  I have sent in medication to use when your pain is severe.  Otherwise you may use your regular medications or Tylenol .  Get help right away if you begin having shaking chills fever nausea vomiting intractable pain.

## 2024-03-12 NOTE — ED Notes (Addendum)
 Went to give pt a blanket and saw smoke, saw a lit cigarette and smelled the smell of cigarette smoke. Pt was immediately told he could not smoke in the room and he put the cigarette out. He was warned of the risks of same. He became a little upset at the wait time and told staff he was tired of waiting. He was easily calmed down and had no further complaints at this time.

## 2024-03-12 NOTE — ED Notes (Signed)
 PT shouting that he wants to leave. Pt was told that he will be discharged as soon as provider is ready.

## 2024-03-12 NOTE — ED Provider Notes (Signed)
  EMERGENCY DEPARTMENT AT [facility] Provider Note   CSN: [id number] Arrival date & time: 03/12/24  1141     Patient presents with: Supra pubic catheter pain   Bradley Brandt is a 43 y.o. 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  Patient also has a history of colonization with ESBL and VRE.  Contact precautions placed.  Patient reports increased suprapubic pain starting yesterday.  He reports that he was unable to sleep.  I consulted and had a discussion with on-call urologist   HPI     Prior to Admission medications   Medication Sig Start Date End Date Taking? Authorizing Provider  baclofen  (LIORESAL ) 10 MG tablet Take 1 tablet (10 mg total) by mouth 3 (three) times daily. Patient taking differently: Take 10 mg by mouth 4 (four) times daily. 01/30/24   Sater, Charlie LABOR, MD  lamoTRIgine  (LAMICTAL ) 100 MG tablet Take 1 tablet (100 mg total) by mouth 2 (two) times daily. For 5 days, take 1/2 pill daily, for next 5 days take 1/2 pill tice a day, for next 5 days, take 1/2 oill in the morning and 1 pill at night   then take 1 pill twice a day 01/30/24   Sater, Charlie LABOR, MD  natalizumab  (TYSABRI ) 300 MG/15ML injection Inject 15 mLs (300 mg total) into the vein every 28 (twenty-eight) days. Please fax to [phone]. 01/17/24   Vear Charlie LABOR, MD  oxybutynin  (DITROPAN -XL) 10 MG 24 hr tablet Take 1 tablet (10 mg total) by mouth daily. 01/17/23   Paseda, Folashade R, FNP  Vitamin D , Ergocalciferol , (DRISDOL ) 1.25 MG (50000 UNIT) CAPS capsule Take 1 capsule (50,000 Units total) by mouth every 7 (seven) days. Patient not taking: Reported on 08/22/2023 07/22/23   Vear Charlie LABOR, MD    Allergies: Lithium    Review of Systems  Updated Vital Signs BP 125/84 (BP Location: Left Arm)   Pulse 88   Temp 98.1 F (36.7 C) (Oral)   Resp 18   Ht 5' 11 (1.803 m)   Wt 80.7 kg   SpO2 100%   BMI 24.83 kg/m   Physical  Exam Vitals and nursing note reviewed.  Constitutional:      General: He is not in acute distress.    Appearance: He is well-developed. He is not diaphoretic.  HENT:     Head: Normocephalic and atraumatic.  Eyes:     General: No scleral icterus.    Conjunctiva/sclera: Conjunctivae normal.  Cardiovascular:     Rate and Rhythm: Normal rate and regular rhythm.     Heart sounds: Normal heart sounds.  Pulmonary:     Effort: Pulmonary effort is normal. No respiratory distress.     Breath sounds: Normal breath sounds.  Abdominal:     Palpations: Abdomen is soft.     Tenderness: There is no abdominal tenderness.     Comments: Suprapubic catheter in place flowing normally.  Urine in the bag is turbid  Musculoskeletal:     Cervical back: Normal range of motion and neck supple.  Skin:    General: Skin is warm and dry.  Neurological:     Mental Status: He is alert.  Psychiatric:        Behavior: Behavior normal.     (all labs ordered are listed, but only abnormal results are displayed) Labs Reviewed  URINALYSIS, ROUTINE W REFLEX MICROSCOPIC  CBC  BASIC METABOLIC PANEL WITH GFR  EKG: None  Radiology: No results found.   Procedures   Medications Ordered in the ED  lidocaine  (XYLOCAINE ) 2 % jelly 1 Application (has no administration in time range)    Clinical Course as of 03/12/24 1414  Wed Mar 12, 2024  1411 Case discussed with APP [facility] with Urology. Patient will get cystoscopy with botox  injections this coming Monday. He does not recommend changing the catheter at this time and given his hx of colonization doubts acute uti. I agree as WBC is wnl- suspect this  is likely due to his bladder spasms. [AH]    Clinical Course User Index [AH] Arloa Chroman, PA-C                                 Medical Decision Making Amount and/or Complexity of Data Reviewed Labs: ordered.  Risk Prescription drug management.   43 year old male who presents emergency  department with chief complaint of suprapubic pain.  Differential diagnosis includes obstructed urinary catheter, infected ostomy site, bladder spasm, urinary tract infection. History is complicated by patient's health insight, chronic comorbidities of MS and neurogenic bladder as well as history of bacterial colonization with multiresistant bacteria's. I discussed patient's symptoms as per ED course.  The patient also has history of bladder spasms and is scheduled for cystoscopy with Botox  injections.  He is afebrile, he has no back pain fevers, chills.  Given his history of colonization and lack of specific symptoms and due to his history of multiresistant bacteria's I do not think that treatment which would require hospitalization for IV antibiotics is indicated at this time.  As this is backed up and decision making with urology.  He found significant relief with a single dose of low-dose Valium  and oxycodone .  I am discharging him with a few tablets of this medication for symptomatic treatment.  Patient is in agreement and is feeling much better after treatment.PDMP reviewed during this encounter.   I reviewed the patient's labs.  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  Otherwise review of BMP and CBC is fairly unremarkable. Patient will be discharged with strict return precautions including fever chills flank pain intractable pain.    Final diagnoses:  None    ED Discharge Orders     None          Arloa Chroman, PA-C 03/12/24 1714    Patsey Lot, MD 03/13/24 1531

## 2024-03-13 DIAGNOSIS — G35 Multiple sclerosis: Secondary | ICD-10-CM | POA: Diagnosis not present

## 2024-03-16 MED ORDER — GENTAMICIN SULFATE 40 MG/ML IJ SOLN
5.0000 mg/kg | INTRAVENOUS | Status: AC
  Administered 2024-03-17: 400 mg via INTRAVENOUS
  Filled 2024-03-16: qty 10

## 2024-03-17 ENCOUNTER — Encounter (HOSPITAL_COMMUNITY)
Admission: RE | Disposition: A | Discharge status: Home / Self Care | Payer: Self-pay | Source: Home / Self Care | Attending: Urology

## 2024-03-17 ENCOUNTER — Ambulatory Visit (HOSPITAL_BASED_OUTPATIENT_CLINIC_OR_DEPARTMENT_OTHER)

## 2024-03-17 ENCOUNTER — Other Ambulatory Visit: Payer: Self-pay

## 2024-03-17 ENCOUNTER — Ambulatory Visit (HOSPITAL_COMMUNITY)
Admission: RE | Admit: 2024-03-17 | Discharge: 2024-03-17 | Disposition: A | Discharge status: Home / Self Care | Attending: Urology | Admitting: Urology

## 2024-03-17 ENCOUNTER — Encounter (HOSPITAL_COMMUNITY): Payer: Self-pay | Admitting: Urology

## 2024-03-17 ENCOUNTER — Ambulatory Visit (HOSPITAL_COMMUNITY): Payer: Self-pay | Admitting: Medical

## 2024-03-17 DIAGNOSIS — N319 Neuromuscular dysfunction of bladder, unspecified: Secondary | ICD-10-CM | POA: Insufficient documentation

## 2024-03-17 DIAGNOSIS — N3281 Overactive bladder: Secondary | ICD-10-CM | POA: Diagnosis not present

## 2024-03-17 DIAGNOSIS — N312 Flaccid neuropathic bladder, not elsewhere classified: Secondary | ICD-10-CM | POA: Diagnosis present

## 2024-03-17 DIAGNOSIS — G35 Multiple sclerosis: Secondary | ICD-10-CM | POA: Diagnosis not present

## 2024-03-17 DIAGNOSIS — F17210 Nicotine dependence, cigarettes, uncomplicated: Secondary | ICD-10-CM | POA: Diagnosis not present

## 2024-03-17 HISTORY — PX: INSERTION OF SUPRAPUBIC CATHETER: SHX5870

## 2024-03-17 HISTORY — PX: CYSTOSCOPY WITH INJECTION: SHX1424

## 2024-03-17 SURGERY — CYSTOSCOPY, WITH INJECTION OF BLADDER NECK OR BLADDER WALL
Anesthesia: General

## 2024-03-17 MED ORDER — PHENYLEPHRINE HCL (PRESSORS) 10 MG/ML IV SOLN
INTRAVENOUS | Status: DC | PRN
  Administered 2024-03-17: 160 ug via INTRAVENOUS

## 2024-03-17 MED ORDER — ONABOTULINUMTOXINA 100 UNITS IJ SOLR
INTRAMUSCULAR | Status: DC | PRN
  Administered 2024-03-17: 200 [IU]

## 2024-03-17 MED ORDER — LIDOCAINE HCL (CARDIAC) PF 100 MG/5ML IV SOSY
PREFILLED_SYRINGE | INTRAVENOUS | Status: DC | PRN
  Administered 2024-03-17: 50 mg via INTRAVENOUS

## 2024-03-17 MED ORDER — MIDAZOLAM HCL 2 MG/2ML IJ SOLN
INTRAMUSCULAR | Status: AC
  Filled 2024-03-17: qty 2

## 2024-03-17 MED ORDER — OXYCODONE HCL 5 MG/5ML PO SOLN: 5.0000 mg | Freq: Once | ORAL | Status: AC | PRN

## 2024-03-17 MED ORDER — STERILE WATER FOR IRRIGATION IR SOLN
Status: DC | PRN
  Administered 2024-03-17: 3000 mL

## 2024-03-17 MED ORDER — CHLORHEXIDINE GLUCONATE 0.12 % MT SOLN
15.0000 mL | Freq: Once | OROMUCOSAL | Status: AC
  Administered 2024-03-17: 15 mL via OROMUCOSAL

## 2024-03-17 MED ORDER — FENTANYL CITRATE (PF) 100 MCG/2ML IJ SOLN
INTRAMUSCULAR | Status: DC | PRN
  Administered 2024-03-17 (×2): 50 ug via INTRAVENOUS

## 2024-03-17 MED ORDER — SODIUM CHLORIDE (PF) 0.9 % IJ SOLN
INTRAMUSCULAR | Status: DC | PRN
  Administered 2024-03-17: 20 mL

## 2024-03-17 MED ORDER — FENTANYL CITRATE PF 50 MCG/ML IJ SOSY
PREFILLED_SYRINGE | INTRAMUSCULAR | Status: AC
  Filled 2024-03-17: qty 1

## 2024-03-17 MED ORDER — PROPOFOL 10 MG/ML IV BOLUS
INTRAVENOUS | Status: DC | PRN
  Administered 2024-03-17: 200 mg via INTRAVENOUS

## 2024-03-17 MED ORDER — LACTATED RINGERS IV SOLN: INTRAVENOUS | Status: DC

## 2024-03-17 MED ORDER — ONDANSETRON HCL 4 MG/2ML IJ SOLN
INTRAMUSCULAR | Status: DC | PRN
  Administered 2024-03-17: 4 mg via INTRAVENOUS

## 2024-03-17 MED ORDER — FENTANYL CITRATE PF 50 MCG/ML IJ SOSY
25.0000 ug | PREFILLED_SYRINGE | INTRAMUSCULAR | Status: DC | PRN
  Administered 2024-03-17: 50 ug via INTRAVENOUS

## 2024-03-17 MED ORDER — DROPERIDOL 2.5 MG/ML IJ SOLN: 0.6250 mg | Freq: Once | INTRAMUSCULAR | Status: DC | PRN

## 2024-03-17 MED ORDER — ORAL CARE MOUTH RINSE: 15.0000 mL | Freq: Once | OROMUCOSAL | Status: AC

## 2024-03-17 MED ORDER — OXYCODONE HCL 5 MG PO TABS
ORAL_TABLET | ORAL | Status: AC
  Filled 2024-03-17: qty 1

## 2024-03-17 MED ORDER — ONABOTULINUMTOXINA 100 UNITS IJ SOLR
INTRAMUSCULAR | Status: AC
  Filled 2024-03-17: qty 200

## 2024-03-17 MED ORDER — FENTANYL CITRATE (PF) 100 MCG/2ML IJ SOLN
INTRAMUSCULAR | Status: AC
  Filled 2024-03-17: qty 2

## 2024-03-17 MED ORDER — OXYCODONE HCL 5 MG PO TABS
5.0000 mg | ORAL_TABLET | Freq: Once | ORAL | Status: AC | PRN
  Administered 2024-03-17: 5 mg via ORAL

## 2024-03-17 MED ORDER — PROPOFOL 10 MG/ML IV BOLUS
INTRAVENOUS | Status: AC
Start: 2024-03-17 — End: 2024-03-17
  Filled 2024-03-17: qty 20

## 2024-03-17 MED ORDER — MIDAZOLAM HCL 5 MG/5ML IJ SOLN
INTRAMUSCULAR | Status: DC | PRN
  Administered 2024-03-17: 2 mg via INTRAVENOUS

## 2024-03-17 MED ORDER — ACETAMINOPHEN 10 MG/ML IV SOLN: 1000.0000 mg | Freq: Once | INTRAVENOUS | Status: DC | PRN

## 2024-03-17 MED ORDER — ONDANSETRON HCL 4 MG/2ML IJ SOLN: 4.0000 mg | Freq: Once | INTRAMUSCULAR | Status: DC | PRN

## 2024-03-17 MED ORDER — SODIUM CHLORIDE (PF) 0.9 % IJ SOLN
INTRAMUSCULAR | Status: AC
  Filled 2024-03-17: qty 20

## 2024-03-17 SURGICAL SUPPLY — 16 items
BAG URINE DRAIN 2000ML AR STRL (UROLOGICAL SUPPLIES) IMPLANT
BAG URO CATCHER STRL LF (MISCELLANEOUS) ×1 IMPLANT
CATH FOLEY 2WAY SLVR 5CC 20FR (CATHETERS) IMPLANT
CLOTH BEACON ORANGE TIMEOUT ST (SAFETY) ×1 IMPLANT
GLOVE BIO SURGEON STRL SZ7.5 (GLOVE) ×1 IMPLANT
GOWN STRL REUS W/ TWL XL LVL3 (GOWN DISPOSABLE) ×1 IMPLANT
HOLDER FOLEY CATH W/STRAP (MISCELLANEOUS) IMPLANT
KIT TURNOVER KIT A (KITS) ×1 IMPLANT
MANIFOLD NEPTUNE II (INSTRUMENTS) ×1 IMPLANT
NDL ASPIRATION 22 (NEEDLE) ×1 IMPLANT
NDL SAFETY ECLIPSE 18X1.5 (NEEDLE) IMPLANT
NEEDLE ASPIRATION 22 (NEEDLE) ×1 IMPLANT
PACK CYSTO (CUSTOM PROCEDURE TRAY) ×1 IMPLANT
SYR CONTROL 10ML LL (SYRINGE) IMPLANT
TUBING CONNECTING 10 (TUBING) IMPLANT
WATER STERILE IRR 3000ML UROMA (IV SOLUTION) ×1 IMPLANT

## 2024-03-17 NOTE — Anesthesia Preprocedure Evaluation (Addendum)
 Anesthesia Evaluation  Patient identified by MRN, date of birth, ID band Patient awake    Reviewed: Allergy & Precautions, NPO status , Patient's Chart, lab work & pertinent test results  Airway Mallampati: I  TM Distance: >3 FB Neck ROM: Full    Dental  (+) Dental Advisory Given, Poor Dentition   Pulmonary Current Smoker and Patient abstained from smoking.   breath sounds clear to auscultation       Cardiovascular (-) CAD and (-) Past MI  Rhythm:Regular Rate:Normal     Neuro/Psych Multiple Sclerosis  Neuromuscular disease    GI/Hepatic   Endo/Other    Renal/GU Renal disease Bladder dysfunction (Neurogenic Bladder s/p Suprapubic catheter)      Musculoskeletal   Abdominal   Peds  Hematology  (+) Blood dyscrasia, anemia   Anesthesia Other Findings   Reproductive/Obstetrics                              Anesthesia Physical Anesthesia Plan  ASA: 2  Anesthesia Plan: General   Post-op Pain Management:    Induction: Intravenous  PONV Risk Score and Plan: 1  Airway Management Planned: LMA  Additional Equipment:   Intra-op Plan:   Post-operative Plan: Extubation in OR  Informed Consent:      Dental advisory given  Plan Discussed with:   Anesthesia Plan Comments:          Anesthesia Quick Evaluation

## 2024-03-17 NOTE — Anesthesia Procedure Notes (Signed)
 Procedure Name: LMA Insertion Date/Time: 03/17/2024 9:39 AM  Performed by: Levander Lani BIRCH, CRNAPre-anesthesia Checklist: Patient identified, Emergency Drugs available, Suction available, Patient being monitored and Timeout performed Patient Re-evaluated:Patient Re-evaluated prior to induction Oxygen  Delivery Method: Circle system utilized Preoxygenation: Pre-oxygenation with 100% oxygen  Induction Type: IV induction Ventilation: Mask ventilation without difficulty LMA: LMA inserted LMA Size: 4.0 Number of attempts: 1 Placement Confirmation: positive ETCO2 and breath sounds checked- equal and bilateral Secured at: 22 cm Tube secured with: Tape Dental Injury: Teeth and Oropharynx as per pre-operative assessment

## 2024-03-17 NOTE — Op Note (Signed)
 Operative Note   Preoperative diagnosis:  1.  Neurogenic bladder with detrusor overactivity   Postoperative diagnosis: 1.  Same   Procedure(s): 1.  Cystoscopy with intra detrusor Botox  injection, 200 units 2.  Simple suprapubic tube exchange   Surgeon: Sherwood Edison, MD   Assistants: None   Anesthesia: General   Complications: None immediate   EBL: Minimal   Specimens: 1.  None   Drains/Catheters: 1.  20 French suprapubic tube   Intraoperative findings: 1.  Normal anterior urethra 2.  Nonobstructing prostate 3.  Bladder mucosa without any tumors masses or stones but had some diffuse catheter edema   Indication: 43 year old male with neurogenic bladder with detrusor overactivity managed with suprapubic tube presents for intra detrusor Botox    Description of procedure:   The patient was identified and consent was obtained.  The patient was taken to the operating room and placed in the supine position.  The patient was placed under general anesthesia.  Perioperative antibiotics were administered.  The patient was placed in dorsal lithotomy.  Patient was prepped and draped in a standard sterile fashion and a timeout was performed.   A rigid cystoscope was advanced into the urethra and into the bladder.  Complete cystoscopy was performed with findings noted above.  200 units of Botox  was systematically injected throughout the bladder.  There was no significant active bleeding noted.  Scope was withdrawn and a 20 French suprapubic tube placed.  This concluded the operation.  Patient tolerated the procedure well was stable postoperatively.   Plan: Return in 6 months for repeat Botox 

## 2024-03-17 NOTE — H&P (Signed)
 H&P  Chief Complaint: Neurogenic bladder  History of Present Illness: 43 year old male with neurogenic bladder managing his bladder with 200 units of intradetrusor Botox  and suprapubic catheter presents for repeat Botox .  Past Medical History:  Diagnosis Date   Anemia    Anxiety    Bipolar 1 disorder (HCC)    Bipolar disorder, unspecified (HCC) 01/03/2013   MS (multiple sclerosis) dx 2006   relapsing-remitting right sided weakness   Neuromuscular disorder (HCC)    MS   Pneumonia    Right spastic hemiplegia (HCC)    Substance abuse (HCC)    Suprapubic catheter [facility])    Past Surgical History:  Procedure Laterality Date   BOTOX  INJECTION N/A 03/12/2023   Procedure: BOTOX  INJECTION;  Surgeon: Carolee Sherwood JONETTA DOUGLAS, MD;  Location: [facility];  Service: Urology;  Laterality: N/A;   BOTOX  INJECTION N/A 09/10/2023   Procedure: BOTOX  INJECTION;  Surgeon: Carolee Sherwood JONETTA DOUGLAS, MD;  Location: [facility];  Service: Urology;  Laterality: N/A;   CYSTOSCOPY N/A 09/10/2023   Procedure: CYSTOSCOPY;  Surgeon: Carolee Sherwood JONETTA DOUGLAS, MD;  Location: [facility];  Service: Urology;  Laterality: N/A;  45 MINUTE CASE   INSERTION OF SUPRAPUBIC CATHETER N/A 03/12/2023   Procedure: SUPRAPUBIC CATHETER EXCHANGE;  Surgeon: Carolee Sherwood JONETTA DOUGLAS, MD;  Location: [facility];  Service: Urology;  Laterality: N/A;   INSERTION OF SUPRAPUBIC CATHETER  09/10/2023   Procedure: SUPRAPUBIC CATHETER EXCHANGE;  Surgeon: Carolee Sherwood JONETTA DOUGLAS, MD;  Location: [facility];  Service: Urology;;   IR CATHETER TUBE CHANGE  10/31/2022   IR CATHETER TUBE CHANGE  11/02/2022   IR FLUORO GUIDED NEEDLE PLC ASPIRATION/INJECTION LOC  09/07/2022   TUMOR REMOVAL  1983   abdomen benign    Home Medications:  Medications Prior to Admission  Medication Sig Dispense Refill Last Dose/Taking   baclofen  (LIORESAL ) 10 MG tablet Take 1 tablet (10 mg total) by mouth 3 (three) times daily. (Patient taking differently: Take 10 mg by mouth 4 (four) times daily.) 90 tablet 11 Taking  Differently   lamoTRIgine  (LAMICTAL ) 100 MG tablet Take 1 tablet (100 mg total) by mouth 2 (two) times daily. For 5 days, take 1/2 pill daily, for next 5 days take 1/2 pill tice a day, for next 5 days, take 1/2 oill in the morning and 1 pill at night   then take 1 pill twice a day 60 tablet 11 Taking   natalizumab  (TYSABRI ) 300 MG/15ML injection Inject 15 mLs (300 mg total) into the vein every 28 (twenty-eight) days. Please fax to [phone]. 15 mL 11 01/18/2024   oxybutynin  (DITROPAN -XL) 10 MG 24 hr tablet Take 1 tablet (10 mg total) by mouth daily. 90 tablet 1 Taking   diazepam  (VALIUM ) 2 MG tablet Take 1 tablet (2 mg total) by mouth every 6 (six) hours as needed for muscle spasms. 10 tablet 0    oxyCODONE  (ROXICODONE ) 5 MG immediate release tablet Take 0.5-1 tablets (2.5-5 mg total) by mouth every 6 (six) hours as needed for severe pain (pain score 7-10). 10 tablet 0    Vitamin D , Ergocalciferol , (DRISDOL ) 1.25 MG (50000 UNIT) CAPS capsule Take 1 capsule (50,000 Units total) by mouth every 7 (seven) days. (Patient not taking: Reported on 08/22/2023) 13 capsule 1 Not Taking   Allergies:  Allergies  Allergen Reactions   Lithium Rash    Family History  Adopted: Yes  Problem Relation Age of Onset   Lupus Mother    Ataxia Neg Hx  Chorea Neg Hx    Dementia Neg Hx    Mental retardation Neg Hx    Migraines Neg Hx    Multiple sclerosis Neg Hx    Neurofibromatosis Neg Hx    Neuropathy Neg Hx    Parkinsonism Neg Hx    Seizures Neg Hx    Stroke Neg Hx    Social History:  reports that he has been smoking cigarettes. He has been exposed to tobacco smoke. He has never used smokeless tobacco. He reports that he does not currently use alcohol after a past usage of about 12.0 standard drinks of alcohol per week. He reports current drug use. Frequency: 7.00 times per week. Drug: Marijuana.  ROS: A complete review of systems was performed.  All systems are negative except for pertinent findings as  noted. ROS   Physical Exam:  Vital signs in last 24 hours: Temp:  [99 F (37.2 C)] 99 F (37.2 C) (09/29 0820) Pulse Rate:  [88] 88 (09/29 0820) Resp:  [18] 18 (09/29 0820) BP: (136)/(76) 136/76 (09/29 0820) SpO2:  [96 %] 96 % (09/29 0820) General:  Alert and oriented, No acute distress HEENT: Normocephalic, atraumatic Neck: No JVD or lymphadenopathy Cardiovascular: Regular rate and rhythm Lungs: Regular rate and effort Abdomen: Soft, nontender, nondistended, no abdominal masses Back: No CVA tenderness Extremities: No edema Neurologic: Grossly intact  Laboratory Data:  No results found for this or any previous visit (from the past 24 hours). No results found for this or any previous visit (from the past 240 hours). Creatinine: Recent Labs    03/12/24 1336  CREATININE 1.12    Impression/Assessment:  Neurogenic bladder  Plan:  Proceed with cystoscopy with 200 units of intradetrusor Botox .  Sherwood JONETTA Edison, III 03/17/2024, 8:43 AM '

## 2024-03-17 NOTE — Transfer of Care (Signed)
 Immediate Anesthesia Transfer of Care Note  Patient: Bradley Brandt  Procedure(s) Performed: CYSTOSCOPY, WITH INJECTION OF BLADDER NECK OR BLADDER WALL EXCHANGE OF SUPRAPUBIC CATHETER  Patient Location: PACU  Anesthesia Type:General  Level of Consciousness: awake and sedated  Airway & Oxygen  Therapy: Patient Spontanous Breathing and Patient connected to face mask oxygen   Post-op Assessment: Report given to RN and Post -op Vital signs reviewed and stable  Post vital signs: Reviewed and stable  Last Vitals:  Vitals Value Taken Time  BP 155/100 03/17/24 10:09  Temp    Pulse 91 03/17/24 10:09  Resp 18   SpO2 100 % 03/17/24 10:09  Vitals shown include unfiled device data.  Last Pain:  Vitals:   03/17/24 0855  TempSrc:   PainSc: 10-Worst pain ever         Complications: No notable events documented.

## 2024-03-17 NOTE — Anesthesia Postprocedure Evaluation (Signed)
 Anesthesia Post Note  Patient: Bradley Brandt  Procedure(s) Performed: CYSTOSCOPY, WITH INJECTION OF BLADDER NECK OR BLADDER WALL EXCHANGE OF SUPRAPUBIC CATHETER     Patient location during evaluation: PACU Anesthesia Type: General Level of consciousness: awake Pain management: pain level controlled Vital Signs Assessment: post-procedure vital signs reviewed and stable Respiratory status: spontaneous breathing Cardiovascular status: blood pressure returned to baseline Postop Assessment: no apparent nausea or vomiting Anesthetic complications: no   No notable events documented.  Last Vitals:  Vitals:   03/17/24 1100 03/17/24 1119  BP: 105/69 115/74  Pulse: 83 73  Resp:  18  Temp: 36.9 C 37.1 C  SpO2: 97% 98%    Last Pain:  Vitals:   03/17/24 1119  TempSrc: Oral  PainSc: 0-No pain                 Lauraine KATHEE [place]

## 2024-03-17 NOTE — Discharge Instructions (Addendum)
 You may have some blood in the urine.  This is okay as long as her suprapubic tube is draining.  Call or go to the emergency department for fever greater than 101.5.

## 2024-03-18 ENCOUNTER — Encounter (HOSPITAL_COMMUNITY): Payer: Self-pay | Admitting: Urology

## 2024-03-18 DIAGNOSIS — G35 Multiple sclerosis: Secondary | ICD-10-CM | POA: Diagnosis not present

## 2024-03-19 DIAGNOSIS — R269 Unspecified abnormalities of gait and mobility: Secondary | ICD-10-CM | POA: Diagnosis not present

## 2024-03-19 DIAGNOSIS — G959 Disease of spinal cord, unspecified: Secondary | ICD-10-CM | POA: Diagnosis not present

## 2024-03-19 DIAGNOSIS — G709 Myoneural disorder, unspecified: Secondary | ICD-10-CM | POA: Diagnosis not present

## 2024-03-19 DIAGNOSIS — R32 Unspecified urinary incontinence: Secondary | ICD-10-CM | POA: Diagnosis not present

## 2024-03-20 ENCOUNTER — Telehealth: Payer: Self-pay | Admitting: Pharmacy Technician

## 2024-03-20 NOTE — Telephone Encounter (Signed)
 Auth Submission: APPROVED Site of care: Site of care: [facility] Payer: [employer] MEDICAID Medication & CPT/J Code(s) submitted: Tysabri  (Natalizumab ) [id number] Diagnosis Code:  Route of submission (phone, fax, portal):  Phone # Fax # Auth type: Pharmacy Benefit Units/visits requested: 300MG  Q4WKS Reference number: [id number] Approval from: 03/13/23 to 12/10/24

## 2024-03-24 ENCOUNTER — Other Ambulatory Visit: Payer: Self-pay

## 2024-03-24 NOTE — Patient Instructions (Signed)
 Visit Information  Mr. Primeau was given information about Medicaid Managed Care team care coordination services as a part of their [facility] Community Plan Medicaid benefit.   If you would like to schedule transportation through your [facility], please call the following number at least 2 days in advance of your appointment: [phone]   Rides for urgent appointments can also be made after hours by calling Member Services.  Call the Behavioral Health Crisis Line at [phone], at any time, 24 hours a day, 7 days a week. If you are in danger or need immediate medical attention call 911.   Patient verbalizes understanding of instructions and care plan provided today and agrees to view in MyChart. Active MyChart status and patient understanding of how to access instructions and care plan via MyChart confirmed with patient.     RN Care Manager will contact PCP to request refills of medications Telephone follow up appointment with Managed Medicaid care management team member scheduled for: 04/21/24 at 9 AM  Rosaline Finlay, RN MSN [place]  [facility] RN Care Manager Direct Dial: [phone]  Fax: [phone]   Following is a copy of your plan of care:  There are no care plans that you recently modified to display for this patient.

## 2024-03-24 NOTE — Patient Outreach (Signed)
 Complex Care Management   Visit Note  03/24/2024  Name:  Bradley Brandt MRN: [id number] DOB: 1980/10/28  Situation: Referral received for Complex Care Management related to multiple sclerosis I obtained verbal consent from Patient.  Visit completed with Patient  on the phone  Background:   Past Medical History:  Diagnosis Date   Anemia    Anxiety    Bipolar 1 disorder (HCC)    Bipolar disorder, unspecified (HCC) 01/03/2013   MS (multiple sclerosis) dx 2006   relapsing-remitting right sided weakness   Neuromuscular disorder (HCC)    MS   Pneumonia    Right spastic hemiplegia (HCC)    Substance abuse (HCC)    Suprapubic catheter (HCC)     Assessment: Patient Reported Symptoms:  Cognitive Cognitive Status: Able to follow simple commands, Alert and oriented to person, place, and time, Requires Assistance Decision Making, Struggling with memory recall Cognitive/Intellectual Conditions Management [RPT]: None reported or documented in medical history or problem list      Neurological Neurological Review of Symptoms: Other: Oher Neurological Symptoms/Conditions [RPT]: Muscle spasms in legs r/t multiple sclerosis Neurological Management Strategies: Routine screening, Medical device  HEENT HEENT Symptoms Reported: No symptoms reported      Cardiovascular Cardiovascular Symptoms Reported: No symptoms reported Does patient have uncontrolled Hypertension?: No Cardiovascular Management Strategies: Routine screening  Respiratory Respiratory Symptoms Reported: No symptoms reported Additional Respiratory Details: Patient reports he has made efforts to cut back on smoking, is now smoking about 1/2 PPD. Respiratory Management Strategies: Coping strategies  Endocrine Endocrine Symptoms Reported: Not assessed Is patient diabetic?: No    Gastrointestinal Gastrointestinal Symptoms Reported: No symptoms reported Additional Gastrointestinal Details: Patient reports he does not feel like he  has lost any further weight since previous CMRN visit. He reports a good appetite and regular BMs. Gastrointestinal Management Strategies: Incontinence garment/pad (Breif)    Genitourinary Genitourinary Symptoms Reported: Other Other Genitourinary Symptoms: Note per chart review that patient had ED visit 03/12/24 for increased suprapubic pain. He had a cystoscopy and catheter exchange 9/292/25. Patient reports continued suprapubic pain since ED visit, and has run out of Oxycodone  and Valium  prescribed in the ED. Additional Genitourinary Details: Patient reprots his urine is clear and yellow. Genitourinary Management Strategies: Medical device Genitourinary Comment: Patient reports next follow-up with urologist is in 2 weeks. Patient confirms he did get catheter leg strap from urology.  Integumentary Integumentary Symptoms Reported: No symptoms reported    Musculoskeletal Musculoskelatal Symptoms Reviewed: Difficulty walking, Unsteady gait Musculoskeletal Management Strategies: Medical device, Medication therapy, Coping strategies, Adequate rest Musculoskeletal Comment: Patient denies falls since previous CMRN visit Falls in the past year?: Yes Number of falls in past year: 2 or more Was there an injury with Fall?: No Fall Risk Category Calculator: 2 Patient Fall Risk Level: Moderate Fall Risk Patient at Risk for Falls Due to: History of fall(s), Impaired balance/gait Fall risk Follow up: Falls evaluation completed, Education provided, Falls prevention discussed  Psychosocial Psychosocial Symptoms Reported: No symptoms reported          03/24/2024    PHQ2-9 Depression Screening   Little interest or pleasure in doing things Not at all  Feeling down, depressed, or hopeless Not at all  PHQ-2 - Total Score 0  Trouble falling or staying asleep, or sleeping too much    Feeling tired or having little energy    Poor appetite or overeating     Feeling bad about yourself - or that you are a  failure or  have let yourself or your family down    Trouble concentrating on things, such as reading the newspaper or watching television    Moving or speaking so slowly that other people could have noticed.  Or the opposite - being so fidgety or restless that you have been moving around a lot more than usual    Thoughts that you would be better off dead, or hurting yourself in some way    PHQ2-9 Total Score    If you checked off any problems, how difficult have these problems made it for you to do your work, take care of things at home, or get along with other people    Depression Interventions/Treatment      There were no vitals filed for this visit.  Medications Reviewed Today     Reviewed by Arno Rosaline SQUIBB, RN (Registered Nurse) on 03/24/24 at 1338  Med List Status: <None>   Medication Order Taking? Sig Documenting Provider Last Dose Status Informant  0.9 %  sodium chloride  infusion [id number]   Vear Charlie LABOR, MD  Active   acetaminophen  (TYLENOL ) tablet 650 mg [id number]   Vear Charlie LABOR, MD  Active   acetaminophen  (TYLENOL ) tablet 650 mg [id number]   Vear Charlie LABOR, MD  Active   baclofen  (LIORESAL ) 10 MG tablet [id number] Yes Take 1 tablet (10 mg total) by mouth 3 (three) times daily.  Patient taking differently: Take 10 mg by mouth 4 (four) times daily.   Sater, Charlie LABOR, MD  Active Self, Pharmacy Records, Multiple Informants  diazepam  (VALIUM ) 2 MG tablet [id number]  Take 1 tablet (2 mg total) by mouth every 6 (six) hours as needed for muscle spasms.  Patient not taking: Reported on 03/24/2024   Harris, Abigail, PA-C  Active   lamoTRIgine  (LAMICTAL ) 100 MG tablet [id number] Yes Take 1 tablet (100 mg total) by mouth 2 (two) times daily. For 5 days, take 1/2 pill daily, for next 5 days take 1/2 pill tice a day, for next 5 days, take 1/2 oill in the morning and 1 pill at night   then take 1 pill twice a day Sater, Charlie LABOR, MD  Active Self, Pharmacy Records, Multiple Informants            Med Note STEFFI ADELITA Kitchens Mar 03, 2024 11:33 AM) Just started   loratadine  (CLARITIN ) tablet 10 mg [id number]   Vear Charlie LABOR, MD  Active   loratadine  (CLARITIN ) tablet 10 mg [id number]   Vear Charlie LABOR, MD  Active   natalizumab  (TYSABRI ) 300 MG/15ML injection [id number] Yes Inject 15 mLs (300 mg total) into the vein every 28 (twenty-eight) days. Please fax to [phone]. Sater, Charlie LABOR, MD  Active Self, Pharmacy Records, Multiple Informants  oxybutynin  (DITROPAN -XL) 10 MG 24 hr tablet [id number] Yes Take 1 tablet (10 mg total) by mouth daily. Paseda, Folashade R, FNP  Active Self, Pharmacy Records, Multiple Informants  oxyCODONE  (ROXICODONE ) 5 MG immediate release tablet [id number]  Take 0.5-1 tablets (2.5-5 mg total) by mouth every 6 (six) hours as needed for severe pain (pain score 7-10).  Patient not taking: Reported on 03/24/2024   Harris, Abigail, PA-C  Active   Vitamin D , Ergocalciferol , (DRISDOL ) 1.25 MG (50000 UNIT) CAPS capsule [id number]  Take 1 capsule (50,000 Units total) by mouth every 7 (seven) days.  Patient not taking: Reported on 03/24/2024   Vear Charlie LABOR, MD  Active Self, Pharmacy Records, Multiple Informants  Med Note STEFFI ADELITA Kitchens Mar 03, 2024 11:44 AM) Patient is requesting a refill            Recommendation:   PCP Follow-up Continue Current Plan of Care  Follow Up Plan:   Telephone follow up appointment date/time:  04/21/24 at 9 AM  Rosaline Finlay, RN MSN [place]  [facility] RN Care Manager Direct Dial: [phone]  Fax: [phone]

## 2024-03-31 ENCOUNTER — Telehealth: Payer: Self-pay

## 2024-03-31 NOTE — Telephone Encounter (Signed)
-----   Message from Folashade R Paseda sent at 03/24/2024  4:50 PM EDT ----- Regarding: RE: Patient Update Hello Rosaline , thanks for the updates   Kim Please tell the patient to take Tylenol  650 mg every 6 hours as needed for pain.  He  should also follow-up with urology if he is still having bladder spasm.  Please take him to follow-up in the office as well, I did not refill oxycodone  or Valium .  Thank you ----- Message ----- From: Arno Rosaline SQUIBB, RN Sent: 03/24/2024   1:53 PM EDT To: Folashade R Paseda, FNP Subject: Patient Update                                 Good afternoon Folashade,  I had RN Care Management follow-up visit with patient this afternoon. He had a recent ED visit 03/12/24 for suprapubic pain. He was discharged with Oxycodone  5 mg and Valium  2 mg, but has now run out of medication. He reports medications did help with pain but is 10/10 pain again due to being out and requesting refill. I advised him that would send you a message to update, but that you may want to see him in office for ED follow-up. He has not had an office visit since 07/20/23. He has also reported unintentional weight loss over the past year. Please let me know if you have any questions.  Thank you, Rosaline Arno, RN MSN [place]  [facility] RN Care Manager Direct Dial: [phone]  Fax: [phone]

## 2024-03-31 NOTE — Telephone Encounter (Signed)
 Lvm and advise to call back for questions. KH

## 2024-04-01 ENCOUNTER — Ambulatory Visit

## 2024-04-01 MED ORDER — LORATADINE 10 MG PO TABS: 10.0000 mg | ORAL_TABLET | Freq: Once | ORAL | Status: DC

## 2024-04-01 MED ORDER — ACETAMINOPHEN 325 MG PO TABS: 650.0000 mg | ORAL_TABLET | Freq: Once | ORAL | Status: DC

## 2024-04-07 DIAGNOSIS — R399 Unspecified symptoms and signs involving the genitourinary system: Secondary | ICD-10-CM | POA: Diagnosis not present

## 2024-04-09 ENCOUNTER — Ambulatory Visit (INDEPENDENT_AMBULATORY_CARE_PROVIDER_SITE_OTHER)

## 2024-04-09 VITALS — BP 112/71 | HR 78 | Temp 98.0°F | Resp 16 | Ht 68.0 in | Wt 167.0 lb

## 2024-04-09 DIAGNOSIS — G35D Multiple sclerosis, unspecified: Secondary | ICD-10-CM

## 2024-04-09 MED ORDER — ACETAMINOPHEN 325 MG PO TABS
650.0000 mg | ORAL_TABLET | Freq: Once | ORAL | Status: AC
  Administered 2024-04-09: 650 mg via ORAL
  Filled 2024-04-09: qty 2

## 2024-04-09 MED ORDER — SODIUM CHLORIDE 0.9 % IV SOLN
300.0000 mg | Freq: Once | INTRAVENOUS | Status: AC
  Administered 2024-04-09: 300 mg via INTRAVENOUS
  Filled 2024-04-09: qty 15

## 2024-04-09 MED ORDER — LORATADINE 10 MG PO TABS
10.0000 mg | ORAL_TABLET | Freq: Once | ORAL | Status: AC
  Administered 2024-04-09: 10 mg via ORAL
  Filled 2024-04-09: qty 1

## 2024-04-09 MED ORDER — SODIUM CHLORIDE 0.9 % IV SOLN: INTRAVENOUS | Status: DC

## 2024-04-09 NOTE — Progress Notes (Signed)
 Diagnosis: Multiple Sclerosis  Provider:  Lonna Coder MD  Procedure: IV Infusion  IV Type: Peripheral, IV Location: R Hand  Tysabri  (Natalizumab ), Dose: 300 mg  Infusion Start Time: 1451  Infusion Stop Time: 1553  Post Infusion IV Care: Patient declined observation and Peripheral IV Discontinued  Discharge: Condition: Good, Destination: Home . AVS Provided  Performed by:  Jacquita Mulhearn, RN

## 2024-04-19 DIAGNOSIS — R269 Unspecified abnormalities of gait and mobility: Secondary | ICD-10-CM | POA: Diagnosis not present

## 2024-04-19 DIAGNOSIS — G959 Disease of spinal cord, unspecified: Secondary | ICD-10-CM | POA: Diagnosis not present

## 2024-04-19 DIAGNOSIS — G709 Myoneural disorder, unspecified: Secondary | ICD-10-CM | POA: Diagnosis not present

## 2024-04-21 ENCOUNTER — Other Ambulatory Visit: Payer: Self-pay

## 2024-04-21 NOTE — Patient Instructions (Signed)
 Visit Information  Mr. Reidinger was given information about Medicaid Managed Care team care coordination services as a part of their [facility] Medicaid benefit.   If you would like to schedule transportation through your [facility], please call the following number at least 2 days in advance of your appointment: [phone]   Rides for urgent appointments can also be made after hours by calling Member Services.  Call the Behavioral Health Crisis Line at [phone], at any time, 24 hours a day, 7 days a week. If you are in danger or need immediate medical attention call 911.   Care plan and visit instructions communicated with the patient verbally today. Patient agrees to receive a copy in MyChart. Active MyChart status and patient understanding of how to access instructions and care plan via MyChart confirmed with patient.     The Patient                                              will call urology office and PCP office* as advised to schedule follow-up appointments.  Telephone follow up appointment with Managed Medicaid care management team member scheduled for: 05/19/24 at 9 AM  Rosaline Finlay, RN MSN [place]  [facility] RN Care Manager Direct Dial: [phone]  Fax: [phone]   Following is a copy of your plan of care:  There are no care plans that you recently modified to display for this patient.

## 2024-04-21 NOTE — Patient Outreach (Signed)
 Complex Care Management   Visit Note  04/21/2024  Name:  Bradley Brandt MRN: [id number] DOB: 1981-02-18  Situation: Referral received for Complex Care Management related to multiple sclerosis and suprapubic catheter I obtained verbal consent from Patient.  Visit completed with Patient  on the phone  Background:   Past Medical History:  Diagnosis Date   Anemia    Anxiety    Bipolar 1 disorder (HCC)    Bipolar disorder, unspecified (HCC) 01/03/2013   MS (multiple sclerosis) dx 2006   relapsing-remitting right sided weakness   Neuromuscular disorder (HCC)    MS   Pneumonia    Right spastic hemiplegia (HCC)    Substance abuse (HCC)    Suprapubic catheter (HCC)     Assessment: Patient Reported Symptoms:  Cognitive Cognitive Status: Able to follow simple commands, Alert and oriented to person, place, and time, Requires Assistance Decision Making, Struggling with memory recall Cognitive/Intellectual Conditions Management [RPT]: None reported or documented in medical history or problem list      Neurological Neurological Review of Symptoms: No symptoms reported Oher Neurological Symptoms/Conditions [RPT]: Patient reports he has not been having muscle spasms lately Neurological Management Strategies: Routine screening, Medical device Neurological Comment: Patient is getting IV Tysabri  once a month  HEENT HEENT Symptoms Reported: No symptoms reported      Cardiovascular Cardiovascular Symptoms Reported: No symptoms reported Does patient have uncontrolled Hypertension?: No Cardiovascular Management Strategies: Routine screening  Respiratory Respiratory Symptoms Reported: No symptoms reported Additional Respiratory Details: Patient reports he continues to smoke under a pack a day Respiratory Management Strategies: Coping strategies  Endocrine Endocrine Symptoms Reported: No symptoms reported Is patient diabetic?: No    Gastrointestinal Gastrointestinal Symptoms Reported: No  symptoms reported Additional Gastrointestinal Details: Patient reports a good appetite and regular BMs. He continues to report history of weight loss, although he is unable to report how much weight. Advised to discuss with PCP Gastrointestinal Management Strategies: Incontinence garment/pad (Brief)    Genitourinary Genitourinary Symptoms Reported: Other Other Genitourinary Symptoms: Patient reports continued issues related to suprapubic pain/cramping. He reports he has been taking Ibuprofen  for pain but is not getting any relief. Advised of PCP recommendations to take Tylenol  650 mg q6 hours for pain and to follow-up with urology for ongoing pain. PCP also recommended an office visit. Additional Genitourinary Details: Patient reports urine is clear and yellow Genitourinary Management Strategies: Medical device Genitourinary Comment: Provided patient with PCP and urology office phone number and advised to schedule an appointment for ongoing suprapubic pain/cramping  Integumentary Integumentary Symptoms Reported: No symptoms reported    Musculoskeletal Musculoskelatal Symptoms Reviewed: Difficulty walking, Unsteady gait Musculoskeletal Management Strategies: Medication therapy, Medical device, Coping strategies, Adequate rest Musculoskeletal Comment: Patient denies falls since previous CMRN visit Falls in the past year?: Yes Number of falls in past year: 2 or more Was there an injury with Fall?: No Fall Risk Category Calculator: 2 Patient Fall Risk Level: Moderate Fall Risk Patient at Risk for Falls Due to: History of fall(s), Impaired balance/gait Fall risk Follow up: Falls evaluation completed, Education provided, Falls prevention discussed  Psychosocial Psychosocial Symptoms Reported: No symptoms reported          04/21/2024    PHQ2-9 Depression Screening   Little interest or pleasure in doing things    Feeling down, depressed, or hopeless    PHQ-2 - Total Score    Trouble falling or  staying asleep, or sleeping too much    Feeling tired or having little energy  Poor appetite or overeating     Feeling bad about yourself - or that you are a failure or have let yourself or your family down    Trouble concentrating on things, such as reading the newspaper or watching television    Moving or speaking so slowly that other people could have noticed.  Or the opposite - being so fidgety or restless that you have been moving around a lot more than usual    Thoughts that you would be better off dead, or hurting yourself in some way    PHQ2-9 Total Score    If you checked off any problems, how difficult have these problems made it for you to do your work, take care of things at home, or get along with other people    Depression Interventions/Treatment      There were no vitals filed for this visit.  Medications Reviewed Today     Reviewed by Arno Rosaline SQUIBB, RN (Registered Nurse) on 04/21/24 at [phone]  Med List Status: <None>   Medication Order Taking? Sig Documenting Provider Last Dose Status Informant  baclofen  (LIORESAL ) 10 MG tablet [id number]  Take 1 tablet (10 mg total) by mouth 3 (three) times daily.  Patient taking differently: Take 10 mg by mouth 4 (four) times daily.   Sater, Charlie LABOR, MD  Active Self, Pharmacy Records, Multiple Informants  diazepam  (VALIUM ) 2 MG tablet [id number]  Take 1 tablet (2 mg total) by mouth every 6 (six) hours as needed for muscle spasms.  Patient not taking: Reported on 03/24/2024   Harris, Abigail, PA-C  Active   lamoTRIgine  (LAMICTAL ) 100 MG tablet [id number]  Take 1 tablet (100 mg total) by mouth 2 (two) times daily. For 5 days, take 1/2 pill daily, for next 5 days take 1/2 pill tice a day, for next 5 days, take 1/2 oill in the morning and 1 pill at night   then take 1 pill twice a day Sater, Charlie LABOR, MD  Active Self, Pharmacy Records, Multiple Informants           Med Note STEFFI NIAN   Mon Mar 03, 2024 11:33 AM) Just started    natalizumab  (TYSABRI ) 300 MG/15ML injection [id number]  Inject 15 mLs (300 mg total) into the vein every 28 (twenty-eight) days. Please fax to [phone]. Sater, Charlie LABOR, MD  Active Self, Pharmacy Records, Multiple Informants  oxybutynin  (DITROPAN -XL) 10 MG 24 hr tablet [id number]  Take 1 tablet (10 mg total) by mouth daily. Paseda, Folashade R, FNP  Active Self, Pharmacy Records, Multiple Informants  oxyCODONE  (ROXICODONE ) 5 MG immediate release tablet [id number]  Take 0.5-1 tablets (2.5-5 mg total) by mouth every 6 (six) hours as needed for severe pain (pain score 7-10).  Patient not taking: Reported on 03/24/2024   Harris, Abigail, PA-C  Active   Vitamin D , Ergocalciferol , (DRISDOL ) 1.25 MG (50000 UNIT) CAPS capsule [id number]  Take 1 capsule (50,000 Units total) by mouth every 7 (seven) days.  Patient not taking: Reported on 03/24/2024   Vear Charlie LABOR, MD  Active Self, Pharmacy Records, Multiple Informants           Med Note STEFFI NIAN Kitchens Mar 03, 2024 11:44 AM) Patient is requesting a refill            Recommendation:   PCP Follow-up Specialty provider follow-up urology Continue Current Plan of Care  Follow Up Plan:   Telephone follow up appointment date/time:  05/19/24 at 9 AM  Rosaline Arno,  RN MSN [place]  [facility] RN Care Manager Direct Dial: [phone]  Fax: [phone]

## 2024-04-25 DIAGNOSIS — R339 Retention of urine, unspecified: Secondary | ICD-10-CM | POA: Diagnosis not present

## 2024-04-25 DIAGNOSIS — N319 Neuromuscular dysfunction of bladder, unspecified: Secondary | ICD-10-CM | POA: Diagnosis not present

## 2024-04-25 DIAGNOSIS — N3281 Overactive bladder: Secondary | ICD-10-CM | POA: Diagnosis not present

## 2024-04-25 DIAGNOSIS — T83090A Other mechanical complication of cystostomy catheter, initial encounter: Secondary | ICD-10-CM | POA: Diagnosis not present

## 2024-04-30 ENCOUNTER — Other Ambulatory Visit: Payer: Self-pay | Admitting: Urology

## 2024-05-02 ENCOUNTER — Ambulatory Visit (INDEPENDENT_AMBULATORY_CARE_PROVIDER_SITE_OTHER): Payer: Self-pay | Admitting: Nurse Practitioner

## 2024-05-02 ENCOUNTER — Encounter: Payer: Self-pay | Admitting: Nurse Practitioner

## 2024-05-02 VITALS — BP 92/73 | HR 64 | Wt 172.6 lb

## 2024-05-02 DIAGNOSIS — F17200 Nicotine dependence, unspecified, uncomplicated: Secondary | ICD-10-CM

## 2024-05-02 DIAGNOSIS — G35D Multiple sclerosis, unspecified: Secondary | ICD-10-CM | POA: Diagnosis not present

## 2024-05-02 DIAGNOSIS — N3281 Overactive bladder: Secondary | ICD-10-CM | POA: Diagnosis not present

## 2024-05-02 DIAGNOSIS — G8114 Spastic hemiplegia affecting left nondominant side: Secondary | ICD-10-CM

## 2024-05-02 DIAGNOSIS — Z13220 Encounter for screening for lipoid disorders: Secondary | ICD-10-CM | POA: Diagnosis not present

## 2024-05-02 DIAGNOSIS — N319 Neuromuscular dysfunction of bladder, unspecified: Secondary | ICD-10-CM

## 2024-05-02 DIAGNOSIS — R208 Other disturbances of skin sensation: Secondary | ICD-10-CM

## 2024-05-02 DIAGNOSIS — G8111 Spastic hemiplegia affecting right dominant side: Secondary | ICD-10-CM

## 2024-05-02 DIAGNOSIS — E559 Vitamin D deficiency, unspecified: Secondary | ICD-10-CM | POA: Diagnosis not present

## 2024-05-02 DIAGNOSIS — F17209 Nicotine dependence, unspecified, with unspecified nicotine-induced disorders: Secondary | ICD-10-CM

## 2024-05-02 DIAGNOSIS — Z1211 Encounter for screening for malignant neoplasm of colon: Secondary | ICD-10-CM

## 2024-05-02 MED ORDER — OXYBUTYNIN CHLORIDE ER 10 MG PO TB24: 10.0000 mg | ORAL_TABLET | Freq: Every day | ORAL | 1 refills | Status: AC

## 2024-05-02 NOTE — Assessment & Plan Note (Signed)
 Rechecking vitamin D  level Last vitamin D  Lab Results  Component Value Date   VD25OH 23.0 (L) 07/18/2023

## 2024-05-02 NOTE — Assessment & Plan Note (Signed)
 Advised to pick up prescription for lamotrigine  and start taking as ordered Continue baclofen  10 mg 3 times daily Maintain close follow-up with neurology

## 2024-05-02 NOTE — Progress Notes (Signed)
 Established Patient Office Visit  Subjective:  Patient ID: Bradley Brandt, male    DOB: May 12, 1981  Age: 43 y.o. MRN: [id number]  CC:  Chief Complaint  Patient presents with   Follow-up    [employer] wanted pt to be seen by PCP to fill out paperwork for medicaid to check for overall health    HPI  Discussed the use of AI scribe software for clinical note transcription with the patient, who gave verbal consent to proceed.  History of Present Illness Bradley Brandt is a 43 year old male  has a past medical history of Anemia, Anxiety, Bipolar 1 disorder (HCC), Bipolar disorder, unspecified (HCC) (01/03/2013), MS (multiple sclerosis) (dx 2006), Neuromuscular disorder (HCC), Pneumonia, Right spastic hemiplegia (HCC), Substance abuse (HCC), and Suprapubic catheter (HCC). who presents for a follow-up visit required by [employer] for [employer] paperwork update.  He is accompanied by his sister  He has a Foley catheter in place and attends weekly urology appointments for management, with catheter changes every two weeks. He is able to transfer himself from his chair to bed and occasionally walks with assistance.  He is currently not taking Lamotrigine  that  was prescribed for dysesthesia.  , reports taking Baclofen  he also takes OTC vitamin D , though it is unclear if he has been taking it consistently.  He receives Botox  injections for multiple sclerosis every 28 days at the [facility].  He has a history of smoking and currently smokes one pack per day, reduced from two packs per day.  A Cologuard test was sent to his home, but the sample was not processed due to being unacceptable. He has not received a new box yet.    Assessment & Plan     Past Medical History:  Diagnosis Date   Anemia    Anxiety    Bipolar 1 disorder (HCC)    Bipolar disorder, unspecified (HCC) 01/03/2013   MS (multiple sclerosis) dx 2006   relapsing-remitting right sided weakness    Neuromuscular disorder (HCC)    MS   Pneumonia    Right spastic hemiplegia (HCC)    Substance abuse (HCC)    Suprapubic catheter [facility])     Past Surgical History:  Procedure Laterality Date   BOTOX  INJECTION N/A 03/12/2023   Procedure: BOTOX  INJECTION;  Surgeon: Carolee Sherwood JONETTA DOUGLAS, MD;  Location: [facility];  Service: Urology;  Laterality: N/A;   BOTOX  INJECTION N/A 09/10/2023   Procedure: BOTOX  INJECTION;  Surgeon: Carolee Sherwood JONETTA DOUGLAS, MD;  Location: [facility];  Service: Urology;  Laterality: N/A;   CYSTOSCOPY N/A 09/10/2023   Procedure: CYSTOSCOPY;  Surgeon: Carolee Sherwood JONETTA DOUGLAS, MD;  Location: [facility];  Service: Urology;  Laterality: N/A;  45 MINUTE CASE   CYSTOSCOPY WITH INJECTION N/A 03/17/2024   Procedure: CYSTOSCOPY, WITH INJECTION OF BLADDER NECK OR BLADDER WALL;  Surgeon: Carolee Sherwood JONETTA DOUGLAS, MD;  Location: [facility];  Service: Urology;  Laterality: N/A;  BOTOX  200 UNITS   INSERTION OF SUPRAPUBIC CATHETER N/A 03/12/2023   Procedure: SUPRAPUBIC CATHETER EXCHANGE;  Surgeon: Carolee Sherwood JONETTA DOUGLAS, MD;  Location: [facility];  Service: Urology;  Laterality: N/A;   INSERTION OF SUPRAPUBIC CATHETER  09/10/2023   Procedure: SUPRAPUBIC CATHETER EXCHANGE;  Surgeon: Carolee Sherwood JONETTA DOUGLAS, MD;  Location: [facility];  Service: Urology;;   INSERTION OF SUPRAPUBIC CATHETER N/A 03/17/2024   Procedure: EXCHANGE OF SUPRAPUBIC CATHETER;  Surgeon: Carolee Sherwood JONETTA DOUGLAS, MD;  Location: [facility];  Service: Urology;  Laterality: N/A;   IR CATHETER TUBE CHANGE  10/31/2022   IR CATHETER TUBE CHANGE  11/02/2022   IR FLUORO GUIDED NEEDLE PLC ASPIRATION/INJECTION LOC  09/07/2022   TUMOR REMOVAL  1983   abdomen benign    Family History  Adopted: Yes  Problem Relation Age of Onset   Lupus Mother    Ataxia Neg Hx    Chorea Neg Hx    Dementia Neg Hx    Mental retardation Neg Hx    Migraines Neg Hx    Multiple sclerosis Neg Hx    Neurofibromatosis Neg Hx    Neuropathy Neg Hx    Parkinsonism Neg Hx    Seizures Neg Hx    Stroke Neg Hx      Social History   Socioeconomic History   Marital status: Single    Spouse name: Not on file   Number of children: Not on file   Years of education: Not on file   Highest education level: Not on file  Occupational History   Not on file  Tobacco Use   Smoking status: Every Day    Current packs/day: 1.00    Types: Cigarettes    Passive exposure: Current   Smokeless tobacco: Never  Vaping Use   Vaping status: Some Days  Substance and Sexual Activity   Alcohol use: Not Currently    Alcohol/week: 12.0 standard drinks of alcohol    Types: 12 Cans of beer per week   Drug use: Yes    Frequency: 7.0 times per week    Types: Marijuana    Comment: daily   Sexual activity: Yes    Partners: Female    Birth control/protection: None  Other Topics Concern   Not on file  Social History Narrative   Lives home alone.   Social Drivers of Corporate Investment Banker Strain: Not on file  Food Insecurity: No Food Insecurity (02/07/2024)   Hunger Vital Sign    Worried About Running Out of Food in the Last Year: Never true    Ran Out of Food in the Last Year: Never true  Transportation Needs: No Transportation Needs (02/07/2024)   PRAPARE - Administrator, Civil Service (Medical): No    Lack of Transportation (Non-Medical): No  Physical Activity: Not on file  Stress: Not on file  Social Connections: Unknown (10/17/2021)   Received from [facility]   Social Network    Social Network: Not on file  Intimate Partner Violence: Not At Risk (02/07/2024)   Humiliation, Afraid, Rape, and Kick questionnaire    Fear of Current or Ex-Partner: No    Emotionally Abused: No    Physically Abused: No    Sexually Abused: No    Outpatient Medications Prior to Visit  Medication Sig Dispense Refill   baclofen  (LIORESAL ) 10 MG tablet Take 1 tablet (10 mg total) by mouth 3 (three) times daily. (Patient taking differently: Take 10 mg by mouth 3 (three) times daily as needed.) 90 tablet 11    brimonidine (ALPHAGAN) 0.2 % ophthalmic solution Place 1 drop into both eyes 3 (three) times daily.     cholecalciferol (VITAMIN D3) 25 MCG (1000 UNIT) tablet Take 1,000 Units by mouth daily.     natalizumab  (TYSABRI ) 300 MG/15ML injection Inject 15 mLs (300 mg total) into the vein every 28 (twenty-eight) days. Please fax to [phone]. 15 mL 11   oxybutynin  (DITROPAN -XL) 10 MG 24 hr tablet Take 1 tablet (10 mg total)  by mouth daily. (Patient taking differently: Take 10 mg by mouth in the morning and at bedtime.) 90 tablet 1   oxyCODONE  (ROXICODONE ) 5 MG immediate release tablet Take 0.5-1 tablets (2.5-5 mg total) by mouth every 6 (six) hours as needed for severe pain (pain score 7-10). 10 tablet 0   lamoTRIgine  (LAMICTAL ) 100 MG tablet Take 1 tablet (100 mg total) by mouth 2 (two) times daily. For 5 days, take 1/2 pill daily, for next 5 days take 1/2 pill tice a day, for next 5 days, take 1/2 oill in the morning and 1 pill at night   then take 1 pill twice a day (Patient not taking: Reported on 05/02/2024) 60 tablet 11   Vitamin D , Ergocalciferol , (DRISDOL ) 1.25 MG (50000 UNIT) CAPS capsule Take 1 capsule (50,000 Units total) by mouth every 7 (seven) days. (Patient not taking: Reported on 05/02/2024) 13 capsule 1   diazepam  (VALIUM ) 2 MG tablet Take 1 tablet (2 mg total) by mouth every 6 (six) hours as needed for muscle spasms. (Patient not taking: Reported on 05/02/2024) 10 tablet 0   No facility-administered medications prior to visit.    Allergies  Allergen Reactions   Lithium Rash    ROS Review of Systems  Constitutional:  Negative for appetite change, chills, fatigue and fever.  HENT:  Negative for congestion, postnasal drip, rhinorrhea and sneezing.   Respiratory:  Negative for cough, shortness of breath and wheezing.   Cardiovascular:  Negative for chest pain, palpitations and leg swelling.  Gastrointestinal:  Negative for abdominal pain, constipation, nausea and vomiting.   Genitourinary:  Negative for difficulty urinating, dysuria, flank pain and frequency.  Musculoskeletal:  Negative for arthralgias, back pain, joint swelling and myalgias.  Skin:  Negative for color change, pallor, rash and wound.  Neurological:  Negative for dizziness, facial asymmetry, weakness, numbness and headaches.  Psychiatric/Behavioral:  Negative for behavioral problems, confusion, self-injury and suicidal ideas.       Objective:    Physical Exam Vitals and nursing note reviewed.  Constitutional:      General: He is not in acute distress.    Appearance: Normal appearance. He is not ill-appearing, toxic-appearing or diaphoretic.  Eyes:     General: No scleral icterus.       Right eye: No discharge.        Left eye: No discharge.     Extraocular Movements: Extraocular movements intact.     Conjunctiva/sclera: Conjunctivae normal.  Cardiovascular:     Rate and Rhythm: Normal rate and regular rhythm.     Pulses: Normal pulses.     Heart sounds: Normal heart sounds. No murmur heard.    No friction rub. No gallop.  Pulmonary:     Effort: Pulmonary effort is normal. No respiratory distress.     Breath sounds: Normal breath sounds. No stridor. No wheezing, rhonchi or rales.  Chest:     Chest wall: No tenderness.  Abdominal:     General: There is no distension.     Palpations: Abdomen is soft.     Tenderness: There is no abdominal tenderness. There is no right CVA tenderness, left CVA tenderness or guarding.  Musculoskeletal:        General: Deformity present. No swelling, tenderness or signs of injury.     Right lower leg: No edema.     Left lower leg: No edema.     Comments: Patient sitting in a wheelchair  Skin:    General: Skin is warm and dry.  Capillary Refill: Capillary refill takes less than 2 seconds.     Coloration: Skin is not jaundiced or pale.     Findings: No bruising, erythema or lesion.  Neurological:     Mental Status: He is alert and oriented to  person, place, and time.  Psychiatric:        Mood and Affect: Mood normal.        Behavior: Behavior normal.        Thought Content: Thought content normal.        Judgment: Judgment normal.     BP 92/73 (BP Location: Left Arm, Patient Position: Sitting, Cuff Size: Large)   Pulse 64   Wt 172 lb 9.6 oz (78.3 kg)   SpO2 100%   BMI 26.24 kg/m  Wt Readings from Last 3 Encounters:  05/02/24 172 lb 9.6 oz (78.3 kg)  04/09/24 167 lb (75.8 kg)  03/17/24 178 lb (80.7 kg)    Lab Results  Component Value Date   TSH 1.197 07/30/2022   Lab Results  Component Value Date   WBC 8.8 03/12/2024   HGB 14.9 03/12/2024   HCT 47.0 03/12/2024   MCV 94.0 03/12/2024   PLT 205 03/12/2024   Lab Results  Component Value Date   NA 138 03/12/2024   K 4.3 03/12/2024   CO2 20 (L) 03/12/2024   GLUCOSE 84 03/12/2024   BUN 17 03/12/2024   CREATININE 1.12 03/12/2024   BILITOT 0.2 11/23/2023   ALKPHOS 63 08/27/2023   AST 12 11/23/2023   ALT 12 11/23/2023   PROT 5.9 (L) 11/23/2023   ALBUMIN 3.6 08/27/2023   CALCIUM 9.7 03/12/2024   ANIONGAP 13 03/12/2024   EGFR 85 11/23/2023   Lab Results  Component Value Date   CHOL 173 04/13/2021   Lab Results  Component Value Date   HDL 37 (L) 04/13/2021   Lab Results  Component Value Date   LDLCALC 114 (H) 04/13/2021   Lab Results  Component Value Date   TRIG 119 04/13/2021   Lab Results  Component Value Date   CHOLHDL 4.7 04/13/2021   No results found for: HGBA1C    Assessment & Plan:   Problem List Items Addressed This Visit       Nervous and Auditory   Hemiplegia (HCC) (Chronic)   Uses a wheelchair, able to transfer in and out of the wheelchair by himself       MS (multiple sclerosis) (Chronic)   Multiple sclerosis with spasticity Managed with Natalizumab  infusions  - Continue Natalizumab  infusions every 28 days. Maintain close follow-up with neurology - Start lamotrigine  as per neurologist's instructions. - Ensure  refills are obtained from pharmacy. Take baclofen  10 mg 3 times daily         Genitourinary   Overactive bladder   Relevant Medications   oxybutynin  (DITROPAN -XL) 10 MG 24 hr tablet     Other   Vitamin D  deficiency - Primary   Rechecking vitamin D  level Last vitamin D  Lab Results  Component Value Date   VD25OH 23.0 (L) 07/18/2023         Relevant Orders   VITAMIN D  25 Hydroxy (Vit-D Deficiency, Fractures)   Tobacco use disorder, continuous   Smokes about 1 pack/day  Asked about quitting: confirms that he currently smokes cigarettes Advise to quit smoking: Educated about QUITTING to reduce the risk of cancer, cardio and cerebrovascular disease. Assess willingness: Unwilling to quit at this time, but is working on cutting back. Assist with counseling and  pharmacotherapy: Counseled for 5 minutes and literature provided. Arrange for follow up: follow up in 4 months and continue to offer help.       Dysesthesia   Advised to pick up prescription for lamotrigine  and start taking as ordered Continue baclofen  10 mg 3 times daily Maintain close follow-up with neurology      Neurogenic bladder   Suprapubic catheter in place Encourage to maintain close follow-up with urology      Screening for colon cancer   Colon cancer screening Previous Cologuard test inadequate. New test ordered. - Ordered new Cologuard test.       Relevant Orders   Cologuard   Other Visit Diagnoses       Screening for lipid disorders       Relevant Orders   Lipid panel       Meds ordered this encounter  Medications   oxybutynin  (DITROPAN -XL) 10 MG 24 hr tablet    Sig: Take 1 tablet (10 mg total) by mouth daily.    Dispense:  90 tablet    Refill:  1    Follow-up: Return in about 4 months (around 08/30/2024) for CPE.    Khamari Yousuf R Beauford Lando, FNP

## 2024-05-02 NOTE — Assessment & Plan Note (Signed)
 Multiple sclerosis with spasticity Managed with Natalizumab  infusions  - Continue Natalizumab  infusions every 28 days. Maintain close follow-up with neurology - Start lamotrigine  as per neurologist's instructions. - Ensure refills are obtained from pharmacy. Take baclofen  10 mg 3 times daily

## 2024-05-02 NOTE — Assessment & Plan Note (Signed)
 Uses a wheelchair, able to transfer in and out of the wheelchair by himself

## 2024-05-02 NOTE — Assessment & Plan Note (Signed)
 Smokes about 1 pack/day  Asked about quitting: confirms that he currently smokes cigarettes Advise to quit smoking: Educated about QUITTING to reduce the risk of cancer, cardio and cerebrovascular disease. Assess willingness: Unwilling to quit at this time, but is working on cutting back. Assist with counseling and pharmacotherapy: Counseled for 5 minutes and literature provided. Arrange for follow up: follow up in 4 months and continue to offer help.

## 2024-05-02 NOTE — Patient Instructions (Addendum)
  Please come fasting for your next appointment for a physical  Instruction for taking lamotrigine  The pharmacy has the prescription for lamotrigine  100 mg tablets. For 5 days, just take one half pill a day. For the next 5 days, take one half pill twice a day. For the next 5 days, take one half pill  in the morning and 1 pill at night Then start taking one pill twice a day from this point on.      If you get a rash, need to stop the medication and not take it again.    It is important that you exercise regularly at least 30 minutes 5 times a week as tolerated  Think about what you will eat, plan ahead. Choose  clean, green, fresh or frozen over canned, processed or packaged foods which are more sugary, salty and fatty. 70 to 75% of food eaten should be vegetables and fruit. Three meals at set times with snacks allowed between meals, but they must be fruit or vegetables. Aim to eat over a 12 hour period , example 7 am to 7 pm, and STOP after  your last meal of the day. Drink water ,generally about 64 ounces per day, no other drink is as healthy. Fruit juice is best enjoyed in a healthy way, by EATING the fruit.  Thanks for choosing [facility] we consider it a privelige to serve you.

## 2024-05-02 NOTE — Assessment & Plan Note (Addendum)
 Suprapubic catheter in place Encourage to maintain close follow-up with urology

## 2024-05-02 NOTE — Assessment & Plan Note (Addendum)
 Colon cancer screening Previous Cologuard test inadequate. New test ordered. - Ordered new Cologuard test.

## 2024-05-03 LAB — LIPID PANEL
Chol/HDL Ratio: 4.5 ratio (ref 0.0–5.0)
Cholesterol, Total: 174 mg/dL (ref 100–199)
HDL: 39 mg/dL — ABNORMAL LOW (ref >39)
LDL Chol Calc (NIH): 114 mg/dL — ABNORMAL HIGH (ref 0–99)
Triglycerides: 117 mg/dL (ref 0–149)
VLDL Cholesterol Cal: 21 mg/dL (ref 5–40)

## 2024-05-03 LAB — VITAMIN D 25 HYDROXY (VIT D DEFICIENCY, FRACTURES): Vit D, 25-Hydroxy: 25.5 ng/mL — ABNORMAL LOW (ref 30.0–100.0)

## 2024-05-05 ENCOUNTER — Ambulatory Visit: Payer: Self-pay | Admitting: Nurse Practitioner

## 2024-05-05 ENCOUNTER — Other Ambulatory Visit: Payer: Self-pay | Admitting: Nurse Practitioner

## 2024-05-05 DIAGNOSIS — E559 Vitamin D deficiency, unspecified: Secondary | ICD-10-CM

## 2024-05-05 MED ORDER — VITAMIN D 25 MCG (1000 UNIT) PO TABS: 1000.0000 [IU] | ORAL_TABLET | Freq: Every day | ORAL | 1 refills | Status: AC

## 2024-05-06 ENCOUNTER — Telehealth: Payer: Self-pay

## 2024-05-06 NOTE — Patient Instructions (Signed)
 SURGICAL WAITING ROOM VISITATION Patients having surgery or a procedure may have no more than 2 support people in the waiting area - these visitors may rotate in the visitor waiting room.   Due to an increase in RSV and influenza rates and associated hospitalizations, children ages 22 and under may not visit patients in [facility] hospitals. If the patient needs to stay at the hospital during part of their recovery, the visitor guidelines for inpatient rooms apply.  PRE-OP VISITATION  Pre-op nurse will coordinate an appropriate time for 1 support person to accompany the patient in pre-op.  This support person may not rotate.  This visitor will be contacted when the time is appropriate for the visitor to come back in the pre-op area.  Please refer to the [facility] website for the visitor guidelines for Inpatients (after your surgery is over and you are in a regular room).  You are not required to quarantine at this time prior to your surgery. However, you must do this: Hand Hygiene often Do NOT share personal items Notify your provider if you are in close contact with someone who has COVID or you develop fever 100.4 or greater, new onset of sneezing, cough, sore throat, shortness of breath or body aches.  If you test positive for Covid or have been in contact with anyone that has tested positive in the last 10 days please notify you surgeon.    Your procedure is scheduled on:  05/12/24  Report to [facility] Main Entrance: [address] entrance where the [employer] is available.   Report to admitting at: 1:45 PM  Call this number if you have any questions or problems the morning of surgery [phone]  FOLLOW ANY ADDITIONAL PRE OP INSTRUCTIONS YOU RECEIVED FROM YOUR SURGEON'S OFFICE!!!  Do not eat food after Midnight the night prior to your surgery/procedure.  After Midnight you may have the following liquids until: 1:00 PM DAY OF SURGERY  Clear Liquid Diet Water  Black  Coffee (sugar ok, NO MILK/CREAM OR CREAMERS)  Tea (sugar ok, NO MILK/CREAM OR CREAMERS) regular and decaf                             Plain Jell-O  with no fruit (NO RED)                                           Fruit ices (not with fruit pulp, NO RED)                                     Popsicles (NO RED)                                                                  Juice: NO CITRUS JUICES: only apple, WHITE grape, WHITE cranberry Sports drinks like Gatorade or Powerade (NO RED)    Oral Hygiene is also important to reduce your risk of infection.        Remember - BRUSH YOUR TEETH THE MORNING OF SURGERY WITH YOUR REGULAR  TOOTHPASTE  Do NOT smoke after Midnight the night before surgery.  STOP TAKING all Vitamins, Herbs and supplements 1 week before your surgery.   Take ONLY these medicines the morning of surgery with A SIP OF WATER : oxybutinin.                   You may not have any metal on your body including hair pins, jewelry, and body piercing  Do not wear lotions, powders, perfumes / cologne, or deodorant   Men may shave face and neck.  Contacts, Hearing Aids, dentures or bridgework may not be worn into surgery. DENTURES WILL BE REMOVED PRIOR TO SURGERY PLEASE DO NOT APPLY Poly grip OR ADHESIVES!!!  You may bring a small overnight bag with you on the day of surgery, only pack items that are not valuable. [facility] IS NOT RESPONSIBLE   FOR VALUABLES THAT ARE LOST OR STOLEN.   Patients discharged on the day of surgery will not be allowed to drive home.  Someone NEEDS to stay with you for the first 24 hours after anesthesia.  Do not bring your home medications to the hospital. The Pharmacy will dispense medications listed on your medication list to you during your admission in the Hospital.  Special Instructions: Bring a copy of your healthcare power of attorney and living will documents the day of surgery, if you wish to have them scanned into your [facility]  Records- EPIC  Please read over the following fact sheets you were given: IF YOU HAVE QUESTIONS ABOUT YOUR PRE-OP INSTRUCTIONS, PLEASE CALL [phone]   [facility] - Preparing for Surgery      Before surgery, you can play an important role.  Because skin is not sterile, your skin needs to be as free of germs as possible.  You can reduce the number of germs on your skin by washing with CHG (chlorahexidine gluconate) soap before surgery.  CHG is an antiseptic cleaner which kills germs and bonds with the skin to continue killing germs even after washing. Please DO NOT use if you have an allergy to CHG or antibacterial soaps.  If your skin becomes reddened/irritated stop using the CHG and inform your nurse when you arrive at Short Stay. Do not shave (including legs and underarms) for at least 48 hours prior to the first CHG shower.  You may shave your face/neck.  Please follow these instructions carefully:  1.  Shower with CHG Soap the night before surgery ONLY (DO NOT USE THE SOAP THE MORNING OF SURGERY).  2.  If you choose to wash your hair, wash your hair first as usual with your normal  shampoo.  3.  After you shampoo, rinse your hair and body thoroughly to remove the shampoo.                             4.  Use CHG as you would any other liquid soap.  You can apply chg directly to the skin and wash.  Gently with a scrungie or clean washcloth.  5.  Apply the CHG Soap to your body ONLY FROM THE NECK DOWN.   Do not use on face/ open                           Wound or open sores. Avoid contact with eyes, ears mouth and genitals (private parts).  Wash face,  Genitals (private parts) with your normal soap.             6.  Wash thoroughly, paying special attention to the area where your  surgery  will be performed.  7.  Thoroughly rinse your body with warm water  from the neck down.  8.  DO NOT shower/wash with your normal soap after using and rinsing off the CHG Soap.                 9.  Pat yourself dry with a clean towel.            10.  Wear clean pajamas.            11.  Place clean sheets on your bed the night of your first shower and do not  sleep with pets.  Day of Surgery : Do not apply any CHG, lotions/deodorants the morning of surgery.  Please wear clean clothes to the hospital/surgery center.   FAILURE TO FOLLOW THESE INSTRUCTIONS MAY RESULT IN THE CANCELLATION OF YOUR SURGERY  PATIENT SIGNATURE_________________________________  NURSE SIGNATURE__________________________________  ________________________________________________________________________

## 2024-05-06 NOTE — Telephone Encounter (Signed)
 Called and spoke to pts sister and was able to get appt scheduled in order for form to be filled out

## 2024-05-07 ENCOUNTER — Other Ambulatory Visit: Payer: Self-pay

## 2024-05-07 ENCOUNTER — Ambulatory Visit (INDEPENDENT_AMBULATORY_CARE_PROVIDER_SITE_OTHER)

## 2024-05-07 ENCOUNTER — Encounter (HOSPITAL_COMMUNITY): Payer: Self-pay

## 2024-05-07 ENCOUNTER — Encounter (HOSPITAL_COMMUNITY)
Admission: RE | Admit: 2024-05-07 | Discharge: 2024-05-07 | Disposition: A | Discharge status: Home / Self Care | Source: Ambulatory Visit | Attending: Urology | Admitting: Urology

## 2024-05-07 VITALS — BP 104/66 | HR 56 | Temp 98.1°F | Resp 16 | Ht 69.0 in | Wt 175.0 lb

## 2024-05-07 VITALS — Ht 69.0 in | Wt 172.0 lb

## 2024-05-07 DIAGNOSIS — G35D Multiple sclerosis, unspecified: Secondary | ICD-10-CM | POA: Diagnosis not present

## 2024-05-07 DIAGNOSIS — Z01818 Encounter for other preprocedural examination: Secondary | ICD-10-CM

## 2024-05-07 MED ORDER — LORATADINE 10 MG PO TABS
10.0000 mg | ORAL_TABLET | Freq: Once | ORAL | Status: AC
  Administered 2024-05-07: 10 mg via ORAL
  Filled 2024-05-07: qty 1

## 2024-05-07 MED ORDER — SODIUM CHLORIDE 0.9 % IV SOLN
300.0000 mg | Freq: Once | INTRAVENOUS | Status: AC
  Administered 2024-05-07: 300 mg via INTRAVENOUS
  Filled 2024-05-07: qty 15

## 2024-05-07 MED ORDER — SODIUM CHLORIDE 0.9 % IV SOLN: INTRAVENOUS | Status: DC

## 2024-05-07 MED ORDER — ACETAMINOPHEN 325 MG PO TABS
650.0000 mg | ORAL_TABLET | Freq: Once | ORAL | Status: AC
  Administered 2024-05-07: 650 mg via ORAL
  Filled 2024-05-07: qty 2

## 2024-05-07 NOTE — Progress Notes (Signed)
 Diagnosis: Multiple Sclerosis  Provider:  Lonna Coder MD  Procedure: IV Infusion  IV Type: Peripheral, IV Location: R Forearm  Tysabri  (Natalizumab ), Dose: 300 mg  Infusion Start Time: 1350  Infusion Stop Time: 1454  Post Infusion IV Care: Patient declined observation and Peripheral IV Discontinued  Discharge: Condition: Good, Destination: Home . AVS Provided  Performed by:  Ruqayya Ventress, RN

## 2024-05-07 NOTE — Progress Notes (Signed)
 For Anesthesia: PCP - Paseda, Folashade R, FNP LOV: 05/02/24 Cardiologist - N/A  Bowel Prep reminder:  Chest x-ray - N/A EKG -  Stress Test -  ECHO -  Cardiac Cath -  Pacemaker/ICD device last checked: Pacemaker orders received: Device Rep notified:  Spinal Cord Stimulator:N/A  Sleep Study - N/A CPAP -   Fasting Blood Sugar - N/A Checks Blood Sugar _____ times a day Date and result of last Hgb A1c-  Last dose of GLP1 agonist- N/A GLP1 instructions: Hold 7 days prior to schedule (Hold 24 hours-daily)   Last dose of SGLT-2 inhibitors- N/A SGLT-2 instructions: Hold 72 hours prior to surgery  Blood Thinner Instructions:N/A Last Dose: Time last taken:  Aspirin Instructions:N/A Last Dose: Time last taken:  Activity level: h   Unable to go up a flight of stairs due to MS,he's wheelchair bound..    Anesthesia review:   Patient denies shortness of breath, fever, cough and chest pain at PAT appointment   Patient verbalized understanding of instructions that were reviewed over the telephone.

## 2024-05-07 NOTE — Progress Notes (Signed)
 Pt. Did not showed for PST appointment today,interview was done with his sister over the phone.Pt's sister will come later to pick up instructions and CHG soup.

## 2024-05-09 LAB — COMPREHENSIVE METABOLIC PANEL WITH GFR
AG Ratio: 1.4 (calc) (ref 1.0–2.5)
ALT: 26 U/L (ref 9–46)
AST: 31 U/L (ref 10–40)
Albumin: 4.1 g/dL (ref 3.6–5.1)
Alkaline phosphatase (APISO): 69 U/L (ref 36–130)
BUN: 18 mg/dL (ref 7–25)
CO2: 23 mmol/L (ref 20–32)
Calcium: 9.3 mg/dL (ref 8.6–10.3)
Chloride: 106 mmol/L (ref 98–110)
Creat: 0.91 mg/dL (ref 0.60–1.29)
Globulin: 3 g/dL (ref 1.9–3.7)
Glucose, Bld: 75 mg/dL (ref 65–99)
Potassium: 5.2 mmol/L (ref 3.5–5.3)
Sodium: 139 mmol/L (ref 135–146)
Total Bilirubin: 0.4 mg/dL (ref 0.2–1.2)
Total Protein: 7.1 g/dL (ref 6.1–8.1)
eGFR: 106 mL/min/1.73m2 (ref >=60)

## 2024-05-09 LAB — CBC WITH DIFFERENTIAL/PLATELET
Absolute Lymphocytes: 3426 {cells}/uL (ref 850–3900)
Absolute Monocytes: 332 {cells}/uL (ref 200–950)
Basophils Absolute: 52 {cells}/uL (ref 0–200)
Basophils Relative: 0.8 %
Eosinophils Absolute: 260 {cells}/uL (ref 15–500)
Eosinophils Relative: 4 %
HCT: 37.6 % — ABNORMAL LOW (ref 38.5–50.0)
Hemoglobin: 12.6 g/dL — ABNORMAL LOW (ref 13.2–17.1)
MCH: 31.3 pg (ref 27.0–33.0)
MCHC: 33.5 g/dL (ref 32.0–36.0)
MCV: 93.5 fL (ref 80.0–100.0)
MPV: 10.2 fL (ref 7.5–12.5)
Monocytes Relative: 5.1 %
Neutro Abs: 2431 {cells}/uL (ref 1500–7800)
Neutrophils Relative %: 37.4 %
Platelets: 280 Thousand/uL (ref 140–400)
RBC: 4.02 Million/uL — ABNORMAL LOW (ref 4.20–5.80)
RDW: 14.4 % (ref 11.0–15.0)
Total Lymphocyte: 52.7 %
WBC: 6.5 Thousand/uL (ref 3.8–10.8)

## 2024-05-09 LAB — STRATIFY JCV AB (W/ INDEX) W/ RFLX
Index Value: 0.18 {index}
Stratify JCV (TM) Ab w/Reflex Inhibition: NEGATIVE

## 2024-05-10 ENCOUNTER — Ambulatory Visit: Payer: Self-pay | Admitting: Neurology

## 2024-05-12 ENCOUNTER — Telehealth: Payer: Self-pay | Admitting: Nurse Practitioner

## 2024-05-12 ENCOUNTER — Encounter: Payer: Self-pay | Admitting: Neurology

## 2024-05-12 ENCOUNTER — Ambulatory Visit (HOSPITAL_COMMUNITY): Payer: Self-pay | Admitting: Physician Assistant

## 2024-05-12 ENCOUNTER — Ambulatory Visit: Admitting: Neurology

## 2024-05-12 ENCOUNTER — Ambulatory Visit (HOSPITAL_COMMUNITY): Admitting: Certified Registered Nurse Anesthetist

## 2024-05-12 ENCOUNTER — Encounter (HOSPITAL_COMMUNITY): Payer: Self-pay | Admitting: Urology

## 2024-05-12 ENCOUNTER — Ambulatory Visit (HOSPITAL_COMMUNITY)
Admission: RE | Admit: 2024-05-12 | Discharge: 2024-05-12 | Disposition: A | Discharge status: Home / Self Care | Source: Ambulatory Visit | Attending: Urology | Admitting: Urology

## 2024-05-12 ENCOUNTER — Encounter (HOSPITAL_COMMUNITY)
Admission: RE | Disposition: A | Discharge status: Home / Self Care | Payer: Self-pay | Source: Ambulatory Visit | Attending: Urology

## 2024-05-12 DIAGNOSIS — F17210 Nicotine dependence, cigarettes, uncomplicated: Secondary | ICD-10-CM | POA: Insufficient documentation

## 2024-05-12 DIAGNOSIS — Z01818 Encounter for other preprocedural examination: Secondary | ICD-10-CM

## 2024-05-12 DIAGNOSIS — G35A Relapsing-remitting multiple sclerosis: Secondary | ICD-10-CM | POA: Insufficient documentation

## 2024-05-12 DIAGNOSIS — N318 Other neuromuscular dysfunction of bladder: Secondary | ICD-10-CM | POA: Diagnosis not present

## 2024-05-12 DIAGNOSIS — N319 Neuromuscular dysfunction of bladder, unspecified: Secondary | ICD-10-CM | POA: Diagnosis not present

## 2024-05-12 DIAGNOSIS — N3281 Overactive bladder: Secondary | ICD-10-CM | POA: Diagnosis not present

## 2024-05-12 DIAGNOSIS — F319 Bipolar disorder, unspecified: Secondary | ICD-10-CM | POA: Diagnosis not present

## 2024-05-12 HISTORY — PX: CYSTOSCOPY WITH INJECTION: SHX1424

## 2024-05-12 LAB — CBC
HCT: 40.8 % (ref 39.0–52.0)
Hemoglobin: 13.5 g/dL (ref 13.0–17.0)
MCH: 30.3 pg (ref 26.0–34.0)
MCHC: 33.1 g/dL (ref 30.0–36.0)
MCV: 91.7 fL (ref 80.0–100.0)
Platelets: 243 K/uL (ref 150–400)
RBC: 4.45 MIL/uL (ref 4.22–5.81)
RDW: 14.3 % (ref 11.5–15.5)
WBC: 8.3 K/uL (ref 4.0–10.5)
nRBC: 0.6 % — ABNORMAL HIGH (ref 0.0–0.2)

## 2024-05-12 SURGERY — CYSTOSCOPY, WITH INJECTION OF BLADDER NECK OR BLADDER WALL
Anesthesia: General | Site: Bladder

## 2024-05-12 MED ORDER — SODIUM CHLORIDE (PF) 0.9 % IJ SOLN
INTRAMUSCULAR | Status: DC | PRN
  Administered 2024-05-12: 30 mL via INTRAVENOUS

## 2024-05-12 MED ORDER — ONDANSETRON HCL 4 MG/2ML IJ SOLN
INTRAMUSCULAR | Status: AC
  Filled 2024-05-12: qty 2

## 2024-05-12 MED ORDER — ONABOTULINUMTOXINA 100 UNITS IJ SOLR
INTRAMUSCULAR | Status: AC
  Filled 2024-05-12: qty 300

## 2024-05-12 MED ORDER — ONDANSETRON HCL 4 MG/2ML IJ SOLN
INTRAMUSCULAR | Status: DC | PRN
  Administered 2024-05-12: 4 mg via INTRAVENOUS

## 2024-05-12 MED ORDER — FENTANYL CITRATE (PF) 50 MCG/ML IJ SOSY
PREFILLED_SYRINGE | INTRAMUSCULAR | Status: AC
  Filled 2024-05-12: qty 1

## 2024-05-12 MED ORDER — OXYCODONE HCL 5 MG/5ML PO SOLN: 5.0000 mg | Freq: Once | ORAL | Status: AC | PRN

## 2024-05-12 MED ORDER — DEXAMETHASONE SODIUM PHOSPHATE 4 MG/ML IJ SOLN
INTRAMUSCULAR | Status: DC | PRN
  Administered 2024-05-12: 5 mg via INTRAVENOUS

## 2024-05-12 MED ORDER — LACTATED RINGERS IV SOLN: INTRAVENOUS | Status: DC

## 2024-05-12 MED ORDER — SODIUM CHLORIDE 0.9 % IR SOLN
Status: DC | PRN
  Administered 2024-05-12: 1000 mL

## 2024-05-12 MED ORDER — ACETAMINOPHEN 10 MG/ML IV SOLN
1000.0000 mg | Freq: Once | INTRAVENOUS | Status: DC | PRN
  Administered 2024-05-12: 1000 mg via INTRAVENOUS

## 2024-05-12 MED ORDER — OXYCODONE HCL 5 MG PO TABS
ORAL_TABLET | ORAL | Status: AC
  Filled 2024-05-12: qty 1

## 2024-05-12 MED ORDER — FENTANYL CITRATE (PF) 50 MCG/ML IJ SOSY
25.0000 ug | PREFILLED_SYRINGE | INTRAMUSCULAR | Status: DC | PRN
  Administered 2024-05-12 (×3): 50 ug via INTRAVENOUS

## 2024-05-12 MED ORDER — MIDAZOLAM HCL 2 MG/2ML IJ SOLN
INTRAMUSCULAR | Status: AC
  Filled 2024-05-12: qty 2

## 2024-05-12 MED ORDER — ORAL CARE MOUTH RINSE: 15.0000 mL | Freq: Once | OROMUCOSAL | Status: DC

## 2024-05-12 MED ORDER — CHLORHEXIDINE GLUCONATE 0.12 % MT SOLN: 15.0000 mL | Freq: Once | OROMUCOSAL | Status: DC

## 2024-05-12 MED ORDER — OXYCODONE HCL 5 MG PO TABS
5.0000 mg | ORAL_TABLET | Freq: Once | ORAL | Status: AC | PRN
  Administered 2024-05-12: 5 mg via ORAL

## 2024-05-12 MED ORDER — SODIUM CHLORIDE (PF) 0.9 % IJ SOLN
INTRAMUSCULAR | Status: AC
  Filled 2024-05-12: qty 30

## 2024-05-12 MED ORDER — FENTANYL CITRATE (PF) 50 MCG/ML IJ SOSY
PREFILLED_SYRINGE | INTRAMUSCULAR | Status: AC
  Filled 2024-05-12: qty 2

## 2024-05-12 MED ORDER — SODIUM CHLORIDE 0.9 % IV SOLN
2.0000 g | INTRAVENOUS | Status: AC
  Administered 2024-05-12: 1 g via INTRAVENOUS
  Filled 2024-05-12: qty 20

## 2024-05-12 MED ORDER — ONDANSETRON HCL 4 MG/2ML IJ SOLN: 4.0000 mg | Freq: Once | INTRAMUSCULAR | Status: DC | PRN

## 2024-05-12 MED ORDER — PHENYLEPHRINE 80 MCG/ML (10ML) SYRINGE FOR IV PUSH (FOR BLOOD PRESSURE SUPPORT)
PREFILLED_SYRINGE | INTRAVENOUS | Status: DC | PRN
  Administered 2024-05-12 (×3): 80 ug via INTRAVENOUS

## 2024-05-12 MED ORDER — LIDOCAINE HCL (CARDIAC) PF 100 MG/5ML IV SOSY
PREFILLED_SYRINGE | INTRAVENOUS | Status: DC | PRN
  Administered 2024-05-12: 100 mg via INTRATRACHEAL

## 2024-05-12 MED ORDER — PROPOFOL 10 MG/ML IV BOLUS
INTRAVENOUS | Status: DC | PRN
  Administered 2024-05-12: 50 mg via INTRAVENOUS
  Administered 2024-05-12: 100 mg via INTRAVENOUS

## 2024-05-12 MED ORDER — ACETAMINOPHEN 10 MG/ML IV SOLN
INTRAVENOUS | Status: AC
  Filled 2024-05-12: qty 100

## 2024-05-12 MED ORDER — PROPOFOL 500 MG/50ML IV EMUL
INTRAVENOUS | Status: DC | PRN
  Administered 2024-05-12: 125 ug/kg/min via INTRAVENOUS

## 2024-05-12 MED ORDER — LIDOCAINE HCL (PF) 2 % IJ SOLN
INTRAMUSCULAR | Status: AC
  Filled 2024-05-12: qty 5

## 2024-05-12 MED ORDER — PROPOFOL 1000 MG/100ML IV EMUL
INTRAVENOUS | Status: AC
  Filled 2024-05-12: qty 100

## 2024-05-12 MED ORDER — AMISULPRIDE (ANTIEMETIC) 5 MG/2ML IV SOLN: 10.0000 mg | Freq: Once | INTRAVENOUS | Status: DC | PRN

## 2024-05-12 MED ORDER — ONABOTULINUMTOXINA 100 UNITS IJ SOLR
INTRAMUSCULAR | Status: DC | PRN
  Administered 2024-05-12: 300 [IU] via INTRAMUSCULAR

## 2024-05-12 SURGICAL SUPPLY — 13 items
BAG URO CATCHER STRL LF (MISCELLANEOUS) ×1 IMPLANT
CATH FOLEY 2WAY SLVR 30CC 20FR (CATHETERS) IMPLANT
CLOTH BEACON ORANGE TIMEOUT ST (SAFETY) ×1 IMPLANT
GLOVE BIO SURGEON STRL SZ7.5 (GLOVE) ×1 IMPLANT
GOWN STRL REUS W/ TWL XL LVL3 (GOWN DISPOSABLE) ×1 IMPLANT
KIT TURNOVER KIT A (KITS) ×1 IMPLANT
MANIFOLD NEPTUNE II (INSTRUMENTS) ×1 IMPLANT
NDL ASPIRATION 22 (NEEDLE) ×1 IMPLANT
NDL SAFETY ECLIPSE 18X1.5 (NEEDLE) IMPLANT
PACK CYSTO (CUSTOM PROCEDURE TRAY) ×1 IMPLANT
SYR CONTROL 10ML LL (SYRINGE) IMPLANT
TUBING CONNECTING 10 (TUBING) IMPLANT
WATER STERILE IRR 3000ML UROMA (IV SOLUTION) ×1 IMPLANT

## 2024-05-12 NOTE — Discharge Instructions (Signed)
 You may have some blood in the urine.  This is okay as long as her catheter is draining.  Call or go to the emergency department for fever over 101.

## 2024-05-12 NOTE — Anesthesia Preprocedure Evaluation (Signed)
 Anesthesia Evaluation  Patient identified by MRN, date of birth, ID band Patient awake    Reviewed: Allergy & Precautions, NPO status , Patient's Chart, lab work & pertinent test results  History of Anesthesia Complications Negative for: history of anesthetic complications  Airway Mallampati: I  TM Distance: >3 FB Neck ROM: Full    Dental  (+) Dental Advisory Given, Poor Dentition   Pulmonary Current Smoker and Patient abstained from smoking.   breath sounds clear to auscultation       Cardiovascular (-) CAD and (-) Past MI  Rhythm:Regular Rate:Normal     Neuro/Psych  PSYCHIATRIC DISORDERS Anxiety  Bipolar Disorder   Multiple Sclerosis  Neuromuscular disease    GI/Hepatic   Endo/Other    Renal/GU Renal disease Bladder dysfunction (Neurogenic Bladder s/p Suprapubic catheter)      Musculoskeletal   Abdominal   Peds  Hematology  (+) Blood dyscrasia, anemia   Anesthesia Other Findings   Reproductive/Obstetrics                              Anesthesia Physical Anesthesia Plan  ASA: 2  Anesthesia Plan: General   Post-op Pain Management:    Induction: Intravenous  PONV Risk Score and Plan: 1  Airway Management Planned:   Additional Equipment:   Intra-op Plan:   Post-operative Plan: Extubation in OR  Informed Consent:      Dental advisory given  Plan Discussed with: CRNA  Anesthesia Plan Comments:          Anesthesia Quick Evaluation

## 2024-05-12 NOTE — Telephone Encounter (Signed)
 Copied from CRM #[id number]. Topic: General - Other >> May 12, 2024 11:00 AM Nathanel BROCKS wrote: Reason for CRM: [phone] Nena with [facility] is following up on a fax for Mr Majette a home health aide. Please advise when received.

## 2024-05-12 NOTE — H&P (Signed)
 H&P  Chief Complaint: Neurogenic bladder  History of Present Illness: 43 year old male with neurogenic bladder managed with intradetrusor Botox  and suprapubic tube presents for repeat Botox  with 300 units this time.  Had persistent symptoms despite 200 units last injection.  He denies getting Botox  in any other locations  Past Medical History:  Diagnosis Date   Anemia    Anxiety    Bipolar 1 disorder (HCC)    Bipolar disorder, unspecified (HCC) 01/03/2013   MS (multiple sclerosis) dx 2006   relapsing-remitting right sided weakness   Neuromuscular disorder (HCC)    MS   Pneumonia    Right spastic hemiplegia (HCC)    Substance abuse (HCC)    Suprapubic catheter [facility])    Past Surgical History:  Procedure Laterality Date   BOTOX  INJECTION N/A 03/12/2023   Procedure: BOTOX  INJECTION;  Surgeon: Carolee Sherwood JONETTA DOUGLAS, MD;  Location: [facility];  Service: Urology;  Laterality: N/A;   BOTOX  INJECTION N/A 09/10/2023   Procedure: BOTOX  INJECTION;  Surgeon: Carolee Sherwood JONETTA DOUGLAS, MD;  Location: [facility];  Service: Urology;  Laterality: N/A;   CYSTOSCOPY N/A 09/10/2023   Procedure: CYSTOSCOPY;  Surgeon: Carolee Sherwood JONETTA DOUGLAS, MD;  Location: [facility];  Service: Urology;  Laterality: N/A;  45 MINUTE CASE   CYSTOSCOPY WITH INJECTION N/A 03/17/2024   Procedure: CYSTOSCOPY, WITH INJECTION OF BLADDER NECK OR BLADDER WALL;  Surgeon: Carolee Sherwood JONETTA DOUGLAS, MD;  Location: [facility];  Service: Urology;  Laterality: N/A;  BOTOX  200 UNITS   INSERTION OF SUPRAPUBIC CATHETER N/A 03/12/2023   Procedure: SUPRAPUBIC CATHETER EXCHANGE;  Surgeon: Carolee Sherwood JONETTA DOUGLAS, MD;  Location: [facility];  Service: Urology;  Laterality: N/A;   INSERTION OF SUPRAPUBIC CATHETER  09/10/2023   Procedure: SUPRAPUBIC CATHETER EXCHANGE;  Surgeon: Carolee Sherwood JONETTA DOUGLAS, MD;  Location: [facility];  Service: Urology;;   INSERTION OF SUPRAPUBIC CATHETER N/A 03/17/2024   Procedure: EXCHANGE OF SUPRAPUBIC CATHETER;  Surgeon: Carolee Sherwood JONETTA DOUGLAS, MD;  Location: [facility];  Service:  Urology;  Laterality: N/A;   IR CATHETER TUBE CHANGE  10/31/2022   IR CATHETER TUBE CHANGE  11/02/2022   IR FLUORO GUIDED NEEDLE PLC ASPIRATION/INJECTION LOC  09/07/2022   TUMOR REMOVAL  1983   abdomen benign    Home Medications:  Medications Prior to Admission  Medication Sig Dispense Refill Last Dose/Taking   baclofen  (LIORESAL ) 10 MG tablet Take 1 tablet (10 mg total) by mouth 3 (three) times daily. (Patient taking differently: Take 10 mg by mouth 3 (three) times daily as needed.) 90 tablet 11 Past Week   brimonidine (ALPHAGAN) 0.2 % ophthalmic solution Place 1 drop into both eyes 3 (three) times daily.   Past Week   cholecalciferol (VITAMIN D3) 25 MCG (1000 UNIT) tablet Take 1 tablet (1,000 Units total) by mouth daily. 90 tablet 1 Past Week   natalizumab  (TYSABRI ) 300 MG/15ML injection Inject 15 mLs (300 mg total) into the vein every 28 (twenty-eight) days. Please fax to [phone]. 15 mL 11 Past Month   oxybutynin  (DITROPAN -XL) 10 MG 24 hr tablet Take 1 tablet (10 mg total) by mouth daily. 90 tablet 1 Past Week   lamoTRIgine  (LAMICTAL ) 100 MG tablet Take 1 tablet (100 mg total) by mouth 2 (two) times daily. For 5 days, take 1/2 pill daily, for next 5 days take 1/2 pill tice a day, for next 5 days, take 1/2 oill in the morning and 1 pill at night   then take 1 pill twice a  day (Patient not taking: Reported on 05/02/2024) 60 tablet 11 Not Taking   Allergies:  Allergies  Allergen Reactions   Lithium Rash    Family History  Adopted: Yes  Problem Relation Age of Onset   Lupus Mother    Ataxia Neg Hx    Chorea Neg Hx    Dementia Neg Hx    Mental retardation Neg Hx    Migraines Neg Hx    Multiple sclerosis Neg Hx    Neurofibromatosis Neg Hx    Neuropathy Neg Hx    Parkinsonism Neg Hx    Seizures Neg Hx    Stroke Neg Hx    Social History:  reports that he has been smoking cigarettes. He has been exposed to tobacco smoke. He has never used smokeless tobacco. He reports current  alcohol use of about 12.0 standard drinks of alcohol per week. He reports current drug use. Frequency: 7.00 times per week. Drug: Marijuana.  ROS: A complete review of systems was performed.  All systems are negative except for pertinent findings as noted. ROS   Physical Exam:  Vital signs in last 24 hours: Temp:  [97.8 F (36.6 C)] 97.8 F (36.6 C) (11/24 1428) Pulse Rate:  [54] 54 (11/24 1428) Resp:  [18] 18 (11/24 1428) BP: (117)/(78) 117/78 (11/24 1428) SpO2:  [99 %] 99 % (11/24 1428) Weight:  [79.4 kg] 79.4 kg (11/24 1420) General:  Alert and oriented, No acute distress HEENT: Normocephalic, atraumatic Neck: No JVD or lymphadenopathy Cardiovascular: Regular rate and rhythm Lungs: Regular rate and effort Abdomen: Soft, nontender, nondistended, no abdominal masses Back: No CVA tenderness Extremities: No edema  Laboratory Data:  Results for orders placed or performed during the hospital encounter of 05/12/24 (from the past 24 hours)  CBC per protocol     Status: Abnormal   Collection Time: 05/12/24  2:32 PM  Result Value Ref Range   WBC 8.3 4.0 - 10.5 K/uL   RBC 4.45 4.22 - 5.81 MIL/uL   Hemoglobin 13.5 13.0 - 17.0 g/dL   HCT 59.1 60.9 - 47.9 %   MCV 91.7 80.0 - 100.0 fL   MCH 30.3 26.0 - 34.0 pg   MCHC 33.1 30.0 - 36.0 g/dL   RDW 85.6 88.4 - 84.4 %   Platelets 243 150 - 400 K/uL   nRBC 0.6 (H) 0.0 - 0.2 %   No results found for this or any previous visit (from the past 240 hours). Creatinine: Recent Labs    05/07/24 1327  CREATININE 0.91    Impression/Assessment:  Neurogenic bladder  Plan:  Proceed with intradetrusor Botox  injection of 300 units of Botox  with suprapubic tube exchange  Sherwood JONETTA Edison, III 05/12/2024, 3:10 PM

## 2024-05-12 NOTE — Anesthesia Procedure Notes (Signed)
 Procedure Name: LMA Insertion Date/Time: 05/12/2024 3:47 PM  Performed by: Judythe [name] Aran, CRNAPre-anesthesia Checklist: Emergency Drugs available, Patient identified, Suction available and Patient being monitored Patient Re-evaluated:Patient Re-evaluated prior to induction Oxygen  Delivery Method: Circle system utilized Preoxygenation: Pre-oxygenation with 100% oxygen  Induction Type: IV induction Ventilation: Mask ventilation without difficulty LMA: LMA inserted LMA Size: 4.0 Number of attempts: 1 Placement Confirmation: positive ETCO2 and breath sounds checked- equal and bilateral Tube secured with: Tape Dental Injury: Teeth and Oropharynx as per pre-operative assessment

## 2024-05-12 NOTE — Transfer of Care (Signed)
 Immediate Anesthesia Transfer of Care Note  Patient: Bradley Brandt  Procedure(s) Performed: CYSTOSCOPY, WITH INJECTION OF BLADDER NECK OR BLADDER WALL, SUPRAPUBIC CATHETER EXCHANGE (Bladder)  Patient Location: PACU  Anesthesia Type:General  Level of Consciousness: awake and patient cooperative  Airway & Oxygen  Therapy: Patient Spontanous Breathing and Patient connected to face mask  Post-op Assessment: Report given to RN and Post -op Vital signs reviewed and stable  Post vital signs: Reviewed and stable  Last Vitals:  Vitals Value Taken Time  BP    Temp    Pulse 66 05/12/24 16:18  Resp 23 05/12/24 16:18  SpO2 100 % 05/12/24 16:18  Vitals shown include unfiled device data.  Last Pain:  Vitals:   05/12/24 1428  TempSrc: Oral  PainSc:          Complications: No notable events documented.

## 2024-05-12 NOTE — Op Note (Signed)
 Operative Note   Preoperative diagnosis:  1.  Neurogenic bladder with detrusor overactivity   Postoperative diagnosis: 1.  Same   Procedure(s): 1.  Cystoscopy with intra detrusor Botox  injection, 300 units 2.  Simple suprapubic tube exchange   Surgeon: Sherwood Edison, MD   Assistants: None   Anesthesia: General   Complications: None immediate   EBL: Minimal   Specimens: 1.  None   Drains/Catheters: 1.  20 French suprapubic tube   Intraoperative findings: 1.  Normal anterior urethra 2.  Nonobstructing prostate 3.  Bladder mucosa without any tumors masses or stones but had some diffuse catheter edema   Indication: 43 year old male with neurogenic bladder with detrusor overactivity managed with suprapubic tube presents for intra detrusor Botox    Description of procedure:   The patient was identified and consent was obtained.  The patient was taken to the operating room and placed in the supine position.  The patient was placed under general anesthesia.  Perioperative antibiotics were administered.  The patient was placed in dorsal lithotomy.  Patient was prepped and draped in a standard sterile fashion and a timeout was performed.   A rigid cystoscope was advanced into the urethra and into the bladder.  Complete cystoscopy was performed with findings noted above.  300 units of Botox  was systematically injected throughout the bladder.  There was no significant active bleeding noted.  Scope was withdrawn and a 20 French suprapubic tube placed.  This concluded the operation.  Patient tolerated the procedure well was stable postoperatively.   Plan: Return in 6 months for repeat Botox 

## 2024-05-13 ENCOUNTER — Encounter (HOSPITAL_COMMUNITY): Payer: Self-pay | Admitting: Urology

## 2024-05-13 NOTE — Telephone Encounter (Signed)
 Sending forms to [employer] with records. Kh

## 2024-05-13 NOTE — Anesthesia Postprocedure Evaluation (Signed)
 Anesthesia Post Note  Patient: HAMZAH SAVOCA  Procedure(s) Performed: CYSTOSCOPY, WITH INJECTION OF BLADDER NECK OR BLADDER WALL, SUPRAPUBIC CATHETER EXCHANGE (Bladder)     Patient location during evaluation: PACU Anesthesia Type: General Level of consciousness: awake Pain management: pain level controlled Vital Signs Assessment: post-procedure vital signs reviewed and stable Respiratory status: spontaneous breathing Cardiovascular status: blood pressure returned to baseline Postop Assessment: no apparent nausea or vomiting Anesthetic complications: no   No notable events documented.  Last Vitals:  Vitals:   05/12/24 1715 05/12/24 1728  BP: 134/73 113/68  Pulse: 75   Resp: 16 18  Temp:  (!) 36.2 C  SpO2: 100%     Last Pain:  Vitals:   05/12/24 1728  TempSrc:   PainSc: 4                  Lauraine KATHEE [place]

## 2024-05-13 NOTE — Telephone Encounter (Signed)
 Phone room please offer 3pm Monday as the form cannot be completed until evaluated by provider

## 2024-05-14 NOTE — Telephone Encounter (Signed)
 LVM at 11:07 am for patient to call the office to schedule appointment.

## 2024-05-19 ENCOUNTER — Telehealth: Payer: Self-pay

## 2024-05-19 NOTE — Patient Outreach (Signed)
 Care Coordination   05/19/2024 Name: Bradley Brandt MRN: [id number] DOB: 1981/05/11   Care Coordination Outreach Attempts:  An unsuccessful outreach was attempted for an appointment today.  Follow Up Plan:  Additional outreach attempts will be made to complete CCM follow-up visit.   Encounter Outcome:  No Answer. HIPAA compliant voicemail left requesting return call.   Rosaline Finlay, RN MSN [place]  [facility] Population Health RN Care Manager Direct Dial: [phone]  Fax: [phone]

## 2024-05-19 NOTE — Patient Instructions (Signed)
 Bradley Brandt - I am sorry I was unable to reach you today for our scheduled appointment. I work with Paseda, Folashade R, FNP and am calling to support your healthcare needs. Please contact me at [phone] at your earliest convenience. I look forward to speaking with you soon.   Thank you,  Rosaline Finlay, RN MSN [place]  [facility] RN Care Manager Direct Dial: [phone]  Fax: [phone]

## 2024-05-22 ENCOUNTER — Other Ambulatory Visit: Payer: Self-pay | Admitting: Neurology

## 2024-05-22 ENCOUNTER — Other Ambulatory Visit: Payer: Self-pay

## 2024-05-22 NOTE — Patient Outreach (Signed)
 Complex Care Management   Visit Note  05/22/2024  Name:  Bradley Brandt MRN: [id number] DOB: 23-Dec-1980  Situation: Referral received for Complex Care Management related to multiple sclerosis and suprapubic catheter I obtained verbal consent from Patient.  Visit completed with Patient And his sister Bradley Brandt  on the phone  Background:   Past Medical History:  Diagnosis Date   Anemia    Anxiety    Bipolar 1 disorder (HCC)    Bipolar disorder, unspecified (HCC) 01/03/2013   MS (multiple sclerosis) dx 2006   relapsing-remitting right sided weakness   Neuromuscular disorder (HCC)    MS   Pneumonia    Right spastic hemiplegia (HCC)    Substance abuse (HCC)    Suprapubic catheter (HCC)     Assessment: Patient Reported Symptoms:  Cognitive Cognitive Status: Able to follow simple commands, Alert and oriented to person, place, and time, Requires Assistance Decision Making, Struggling with memory recall Cognitive/Intellectual Conditions Management [RPT]: None reported or documented in medical history or problem list   Health Maintenance Behaviors: Annual physical exam Health Facilitated by: Pain control, Rest  Neurological Neurological Review of Symptoms: No symptoms reported Neurological Management Strategies: Routine screening, Medical device Neurological Comment: Patient's sister reports his next infusion is 06/04/24. Patient's sister reports he is out of Lamotrigine   HEENT HEENT Symptoms Reported: Not assessed      Cardiovascular Cardiovascular Symptoms Reported: Not assessed    Respiratory Respiratory Symptoms Reported: Not assesed    Endocrine Endocrine Symptoms Reported: Not assessed    Gastrointestinal Gastrointestinal Symptoms Reported: No symptoms reported Gastrointestinal Management Strategies: Incontinence garment/pad (Brief) Gastrointestinal Comment: Note per chart review cologuard was ordered. Confirmed that patient has received box, but has not yet had a BM  to complete testing    Genitourinary Genitourinary Symptoms Reported: Other Other Genitourinary Symptoms: Patient continues to report suprapubic pain/cramping. Note per chart review patient had cysto with botox  injection and suprapubic exchange 05/12/24. Patient's sister is unsure when his next urology f/u is, but plans to call today to ask. Patient's sister reports they have been unable to get a refill of Oxybutynin  that was sent on 05/02/24 by PCP. She reports [facility] told her they were not able to get medication until 06/05/24 due to insurance. Genitourinary Management Strategies: Coping strategies, Medical device  Integumentary Integumentary Symptoms Reported: Not assessed    Musculoskeletal Musculoskelatal Symptoms Reviewed: Difficulty walking, Unsteady gait Musculoskeletal Management Strategies: Medication therapy, Medical device, Coping strategies, Adequate rest Musculoskeletal Comment: Patient's sister denies falls since previous CMRN visit Falls in the past year?: Yes Number of falls in past year: 2 or more Was there an injury with Fall?: No Fall Risk Category Calculator: 2 Patient Fall Risk Level: Moderate Fall Risk Patient at Risk for Falls Due to: History of fall(s), Impaired balance/gait Fall risk Follow up: Falls evaluation completed, Education provided, Falls prevention discussed  Psychosocial Psychosocial Symptoms Reported: Not assessed          05/22/2024    PHQ2-9 Depression Screening   Little interest or pleasure in doing things    Feeling down, depressed, or hopeless    PHQ-2 - Total Score    Trouble falling or staying asleep, or sleeping too much    Feeling tired or having little energy    Poor appetite or overeating     Feeling bad about yourself - or that you are a failure or have let yourself or your family down    Trouble concentrating on things, such as  reading the newspaper or watching television    Moving or speaking so slowly that other people  could have noticed.  Or the opposite - being so fidgety or restless that you have been moving around a lot more than usual    Thoughts that you would be better off dead, or hurting yourself in some way    PHQ2-9 Total Score    If you checked off any problems, how difficult have these problems made it for you to do your work, take care of things at home, or get along with other people    Depression Interventions/Treatment      There were no vitals filed for this visit. Pain Scale: 0-10 Pain Score: 10-Worst pain ever Pain Type: Acute pain Pain Location: Abdomen Pain Orientation: Lower  Medications Reviewed Today     Reviewed by Bradley Rosaline SQUIBB, RN (Registered Nurse) on 05/22/24 at 1020  Med List Status: <None>   Medication Order Taking? Sig Documenting Provider Last Dose Status Informant  baclofen  (LIORESAL ) 10 MG tablet [id number] Yes Take 1 tablet (10 mg total) by mouth 3 (three) times daily.  Patient taking differently: Take 10 mg by mouth 3 (three) times daily as needed.   Sater, Bradley LABOR, Brandt  Active Self  brimonidine (ALPHAGAN) 0.2 % ophthalmic solution [id number] Yes Place 1 drop into both eyes 3 (three) times daily. Provider, Historical, Brandt  Active Self  cholecalciferol (VITAMIN D3) 25 MCG (1000 UNIT) tablet [id number] Yes Take 1 tablet (1,000 Units total) by mouth daily. Paseda, Bradley Brandt  Active   lamoTRIgine  (LAMICTAL ) 100 MG tablet [id number]  Take 1 tablet (100 mg total) by mouth 2 (two) times daily. For 5 days, take 1/2 pill daily, for next 5 days take 1/2 pill tice a day, for next 5 days, take 1/2 oill in the morning and 1 pill at night   then take 1 pill twice a day  Patient not taking: Reported on 05/22/2024   Bradley Brandt  Active Self           Med Note Bradley Brandt   Mon Mar 03, 2024 11:33 AM) Just started   natalizumab  (TYSABRI ) 300 MG/15ML injection [id number] Yes Inject 15 mLs (300 mg total) into the vein every 28 (twenty-eight) days. Please fax  to [phone]. Sater, Bradley LABOR, Brandt  Active Self  oxybutynin  (DITROPAN -XL) 10 MG 24 hr tablet [id number]  Take 1 tablet (10 mg total) by mouth daily.  Patient not taking: Reported on 05/22/2024   Paseda, Bradley Brandt  Active             Recommendation:   Continue Current Plan of Care Pick up medications once available from pharmacy  Follow Up Plan:   Telephone follow up appointment date/time:  06/05/24 at 1 PM  Rosaline Arno, RN MSN [place]  [facility] RN Care Manager Direct Dial: [phone]  Fax: [phone]

## 2024-05-22 NOTE — Telephone Encounter (Signed)
 Rosaline (nurse case manager w/[facility]) is asking to speak with RN re: the LAMICTAL  since it has not been filled since Aug. She'd like to discuss a [facility] [phone]  Please call Rosaline on her direct # of [phone],

## 2024-05-22 NOTE — Patient Instructions (Signed)
 Visit Information  [name] was given information about Medicaid Managed Care team care coordination services as a part of their [facility] Medicaid benefit.   If you would like to schedule transportation through your [facility], please call the following number at least 2 days in advance of your appointment: [phone]   Rides for urgent appointments can also be made after hours by calling Member Services.  Call the Behavioral Health Crisis Line at [phone], at any time, 24 hours a day, 7 days a week. If you are in danger or need immediate medical attention call 911.   Care plan and visit instructions communicated with the patient verbally today. Patient agrees to receive a copy in MyChart. Active MyChart status and patient understanding of how to access instructions and care plan via MyChart confirmed with patient.     Telephone follow up appointment with Managed Medicaid care management team member scheduled for: 06/05/24 at 1 PM  Bradley Finlay, RN MSN [place]  [facility] Population Health RN Care Manager Direct Dial: [phone]  Fax: [phone]   Following is a copy of your care plan:   Goals Addressed             This Visit's Progress    [facility] RN Care Plan   On track    Problems:  Chronic Disease Management support and education needs related to multiple sclerosis and presence of suprapubic catheter  Goal: Over the next 30 days the Patient will demonstrate Ongoing adherence to prescribed treatment plan for Multiple Sclerosis as evidenced by patient report of decreased symptoms of muscle spasms and pain  work with urology to manage bladder spasms as evidenced by review of electronic medical record and patient or care team member report    Interventions:   Falls Interventions: Advised patient of importance of notifying provider of falls Assessed for falls since previous CMRN visit   Evaluation of current treatment  plan related to multiple sclerosis and suprapubic catheter self-management and patient's adherence to plan as established by provider. Discussed plans with patient for ongoing care management follow up and provided patient with direct contact information for care management team Reviewed medications with patient and discussed medications needing refills [facility]ine  and Oxybutynin . Per [facility], Lamotrigine  was not filled since August so they will need instruction on how to fill medication. They have 2 prescriptions for Oxybutynin , one from urology stating to take twice a day and one from PCP stating to take once a day. Will need clarification on dosage in order to refill. Call placed to Urology office to confirm patient should be taking 10 mg one tablet twice a day written in June 2025. Call placed to [facility] - RN not available, but front desk staff to request they send updated prescription [facility] with administration instructions. [facility] to [facility] to advise Oxybutynin  prescription 10 mg twice a day and neurology to send updated prescription. Oxybutynin  should be ready for pickup tomorrow afternoon. Patient's sister made aware  Confirmed with urology that patient's next appointment is 05/28/24 for catheter change   Patient Self-Care Activities:  Attend all scheduled provider appointments Call pharmacy for medication refills 3-7 days in advance of running out of medications Call provider office for new concerns or questions  Take medications as prescribed   Pick up medications once available  Plan:  Telephone follow up appointment with care management team member scheduled for:  06/05/24 at 1 PM

## 2024-05-23 NOTE — Telephone Encounter (Signed)
 Lvm to Leeder's sister 1st attempt by hf

## 2024-05-26 MED ORDER — LAMOTRIGINE 100 MG PO TABS: 100.0000 mg | ORAL_TABLET | Freq: Two times a day (BID) | ORAL | 11 refills | Status: AC

## 2024-05-26 NOTE — Telephone Encounter (Signed)
 LMVM for Bradley Brandt, CM for cone.  Returning call relating to lamotrigine .

## 2024-05-26 NOTE — Telephone Encounter (Signed)
 Nurse Case Manager Rosaline has returned call to [place], [place]

## 2024-05-26 NOTE — Addendum Note (Signed)
 Addended by: NEYSA NENA RAMAN on: 05/26/2024 03:06 PM   Modules accepted: Orders

## 2024-05-26 NOTE — Telephone Encounter (Signed)
 Spoke to [place], sports coach for pt.  Pt told her that he took lamotrigine  first 15 days then did not refill.      (Appt in 12-2024 next available.  Ok to restart at this time.  She said he did ok on this, as far as she knew.

## 2024-05-27 NOTE — Telephone Encounter (Signed)
 I called Rosaline, sister of pt.  I relayed that Dr. Vear did go ahead and send in another lamictal  prescription and ok to start again.  I gave her the verbal instructions of titration. Once finishes with that rx then will be 1 tablet bid.  She verbalized understanding.  Prescription sent in last night at 5:20pm.  He should get this refilled when he finishes the first bottle and keep taking 100mg  po bid. (1 tab po bid).

## 2024-05-27 NOTE — Telephone Encounter (Signed)
 Lvm 2nd attempt by hf he has paperwork in the pod to fill out and has been scheduled and no showed an appt ad missed 2 attempts on scheduling routing to the phone room to try to call one more time as this paperwork is important

## 2024-05-28 DIAGNOSIS — R338 Other retention of urine: Secondary | ICD-10-CM | POA: Diagnosis not present

## 2024-05-29 ENCOUNTER — Telehealth: Payer: Self-pay | Admitting: Nurse Practitioner

## 2024-05-29 NOTE — Telephone Encounter (Signed)
 Copied from CRM #[id number]. Topic: Referral - Question >> May 29, 2024  8:56 AM Thersia BROCKS wrote: Reason for CRM: Shatara from [facility] , case manager called in stated patient is requesting that he gets a referral for outpatient physical therapy

## 2024-05-29 NOTE — Telephone Encounter (Signed)
 Please advise if pt can get a referral for PT. [facility]

## 2024-05-29 NOTE — Telephone Encounter (Signed)
 Called Pt  , Lvm  No  answer ,

## 2024-06-03 ENCOUNTER — Other Ambulatory Visit: Payer: Self-pay

## 2024-06-03 DIAGNOSIS — G35D Multiple sclerosis, unspecified: Secondary | ICD-10-CM

## 2024-06-03 DIAGNOSIS — G35A Relapsing-remitting multiple sclerosis: Secondary | ICD-10-CM

## 2024-06-03 NOTE — Telephone Encounter (Signed)
 Done [facility]

## 2024-06-04 ENCOUNTER — Ambulatory Visit

## 2024-06-04 VITALS — BP 102/58 | HR 46 | Temp 98.2°F | Resp 14 | Ht 71.0 in | Wt 175.0 lb

## 2024-06-04 DIAGNOSIS — G35D Multiple sclerosis, unspecified: Secondary | ICD-10-CM

## 2024-06-04 MED ORDER — SODIUM CHLORIDE 0.9 % IV SOLN: INTRAVENOUS | Status: DC

## 2024-06-04 MED ORDER — ACETAMINOPHEN 325 MG PO TABS
650.0000 mg | ORAL_TABLET | Freq: Once | ORAL | Status: AC
  Administered 2024-06-04: 13:00:00 650 mg via ORAL
  Filled 2024-06-04: qty 2

## 2024-06-04 MED ORDER — SODIUM CHLORIDE 0.9 % IV SOLN
300.0000 mg | Freq: Once | INTRAVENOUS | Status: AC
  Administered 2024-06-04: 14:00:00 300 mg via INTRAVENOUS
  Filled 2024-06-04: qty 15

## 2024-06-04 MED ORDER — LORATADINE 10 MG PO TABS
10.0000 mg | ORAL_TABLET | Freq: Once | ORAL | Status: AC
  Administered 2024-06-04: 13:00:00 10 mg via ORAL
  Filled 2024-06-04: qty 1

## 2024-06-04 NOTE — Progress Notes (Signed)
 Diagnosis: , Multiple Sclerosis  Provider:  Lonna Coder MD  Procedure: IV Infusion  IV Type: Peripheral, IV Location: R Forearm  , Tysabri  (Natalizumab ), Dose: 300 mg  Infusion Start Time: 1357  Infusion Stop Time: 1505  Post Infusion IV Care: Patient declined observation and Peripheral IV Discontinued  Discharge: Condition: Good, Destination: Home . AVS Provided  Performed by:  Laasia Arcos, RN

## 2024-06-05 ENCOUNTER — Other Ambulatory Visit: Payer: Self-pay

## 2024-06-05 NOTE — Patient Instructions (Signed)
 Visit Information  Bradley Brandt was given information about Medicaid Managed Care team care coordination services as a part of their [facility] Community Plan Medicaid benefit.   If you would like to schedule transportation through your [facility], please call the following number at least 2 days in advance of your appointment: [phone]   Rides for urgent appointments can also be made after hours by calling Member Services.  Call the Behavioral Health Crisis Line at [phone], at any time, 24 hours a day, 7 days a week. If you are in danger or need immediate medical attention call 911.  Care plan and visit instructions communicated with the patient verbally today. Patient agrees to receive a copy in MyChart. Active MyChart status and patient understanding of how to access instructions and care plan via MyChart confirmed with patient.     Telephone follow up appointment with Managed Medicaid care management team member scheduled for: 07/01/24 at 1:30 PM  Rosaline Finlay, RN MSN [place]  [facility]BC[employer] Population Health RN Care Manager Direct Dial: [phone]  Fax: [phone]   Following is a copy of your plan of care:   Goals Addressed             This Visit's Progress    VBCI RN Care Plan   On track    Problems:  Chronic Disease Management support and education needs related to multiple sclerosis and presence of suprapubic catheter  Goal: Over the next 30 days the Patient will demonstrate Ongoing adherence to prescribed treatment plan for Multiple Sclerosis as evidenced by patient report of decreased symptoms of muscle spasms and pain, compliance with medication  work with urology to manage bladder spasms as evidenced by review of electronic medical record and patient or care team member report   Schedule initial PT evaluation as evidenced by chart review and patient report  Interventions:   Falls Interventions: Advised patient of importance of  notifying provider of falls Assessed for falls since last encounter Advised patient that PT referral was placed 06/03/24. Advised to give office about a week to process referral and reach out. Instructed patient to contact CMRN if they have not heard from office  Evaluation of current treatment plan related to multiple sclerosis and suprapubic catheter self-management and patient's adherence to plan as established by provider. Discussed plans with patient for ongoing care management follow up and provided patient with direct contact information for care management team Reviewed medications with patient and discussed Lamotrigine  administration instructions. Emphasized importance of not stopping medication suddenly Ensured patient was able to get refills of all medications   Patient Self-Care Activities:  Attend all scheduled provider appointments Call pharmacy for medication refills 3-7 days in advance of running out of medications Call provider office for new concerns or questions  Take medications as prescribed    Plan:  Telephone follow up appointment with care management team member scheduled for:  07/01/24 at 1:30 PM

## 2024-06-05 NOTE — Patient Outreach (Signed)
 Complex Care Management   Visit Note  06/05/2024  Name:  Bradley Brandt MRN: [id number] DOB: 12-14-1980  Situation: Referral received for Complex Care Management related to multiple sclerosis I obtained verbal consent from Patient.  Visit completed with Patient  on the phone  Background:   Past Medical History:  Diagnosis Date   Anemia    Anxiety    Bipolar 1 disorder (HCC)    Bipolar disorder, unspecified (HCC) 01/03/2013   MS (multiple sclerosis) dx 2006   relapsing-remitting right sided weakness   Neuromuscular disorder (HCC)    MS   Pneumonia    Right spastic hemiplegia (HCC)    Substance abuse (HCC)    Suprapubic catheter (HCC)     Assessment: Patient Reported Symptoms:  Cognitive Cognitive Status: Able to follow simple commands, Alert and oriented to person, place, and time, Normal speech and language skills, Requires Assistance Decision Making, Struggling with memory recall Cognitive/Intellectual Conditions Management [RPT]: None reported or documented in medical history or problem list   Health Maintenance Behaviors: Annual physical exam Health Facilitated by: Pain control, Rest  Neurological Neurological Review of Symptoms: No symptoms reported Neurological Management Strategies: Medical device, Routine screening Neurological Comment: Patient confirms he was able to get refill of Lamotrigine  and is taking as ordered. He is currently taking 1/2 tablet twice a day  HEENT HEENT Symptoms Reported: Not assessed      Cardiovascular Cardiovascular Symptoms Reported: No symptoms reported Does patient have uncontrolled Hypertension?: No Cardiovascular Management Strategies: Routine screening  Respiratory Respiratory Symptoms Reported: No symptoms reported Additional Respiratory Details: Patient reports he continues to smoke about a half a pack of cigarettes a day Respiratory Management Strategies: Coping strategies  Endocrine Endocrine Symptoms Reported: Not  assessed Is patient diabetic?: No    Gastrointestinal Gastrointestinal Symptoms Reported: No symptoms reported Additional Gastrointestinal Details: Patient reports regular BMs. He has not noticed any further weight loss Gastrointestinal Management Strategies: Incontinence garment/pad (Brief) Gastrointestinal Comment: Patient reports he has not completed cologuard yet    Genitourinary Genitourinary Symptoms Reported: Other Other Genitourinary Symptoms: Patient reports suprapubic pain/cramping is doing a lot better and is not really there anymore. Genitourinary Management Strategies: Coping strategies, Medical device (Suprapubic catheter) Genitourinary Comment: Patient confirms he did pick up Oxybutynin  from pharmacy. He has seen urology for suprapubic exchange since previous visit  Integumentary Integumentary Symptoms Reported: No symptoms reported    Musculoskeletal Musculoskelatal Symptoms Reviewed: Difficulty walking, Unsteady gait Musculoskeletal Management Strategies: Medication therapy, Medical device, Coping strategies, Adequate rest Musculoskeletal Comment: Patient denies falls since previous CMRN visit. Informed patient that outpatient PT referral was placed 06/03/24 Falls in the past year?: Yes Number of falls in past year: 2 or more Was there an injury with Fall?: No Fall Risk Category Calculator: 2 Patient Fall Risk Level: Moderate Fall Risk Patient at Risk for Falls Due to: History of fall(s), Impaired balance/gait Fall risk Follow up: Falls evaluation completed, Education provided, Falls prevention discussed  Psychosocial Psychosocial Symptoms Reported: No symptoms reported          06/05/2024    PHQ2-9 Depression Screening   Little interest or pleasure in doing things    Feeling down, depressed, or hopeless    PHQ-2 - Total Score    Trouble falling or staying asleep, or sleeping too much    Feeling tired or having little energy    Poor appetite or overeating      Feeling bad about yourself - or that you are a failure or have  let yourself or your family down    Trouble concentrating on things, such as reading the newspaper or watching television    Moving or speaking so slowly that other people could have noticed.  Or the opposite - being so fidgety or restless that you have been moving around a lot more than usual    Thoughts that you would be better off dead, or hurting yourself in some way    PHQ2-9 Total Score    If you checked off any problems, how difficult have these problems made it for you to do your work, take care of things at home, or get along with other people    Depression Interventions/Treatment      There were no vitals filed for this visit. Pain Scale: 0-10 Pain Score: 0-No pain  Medications Reviewed Today     Reviewed by Arno Rosaline SQUIBB, RN (Registered Nurse) on 06/05/24 at 1306  Med List Status: <None>   Medication Order Taking? Sig Documenting Provider Last Dose Status Informant  baclofen  (LIORESAL ) 10 MG tablet [id number]  Take 1 tablet (10 mg total) by mouth 3 (three) times daily.  Patient taking differently: Take 10 mg by mouth 3 (three) times daily as needed.   Sater, Charlie LABOR, MD  Active Self  brimonidine (ALPHAGAN) 0.2 % ophthalmic solution [id number]  Place 1 drop into both eyes 3 (three) times daily. [provider]  Active Self  cholecalciferol (VITAMIN D3) 25 MCG (1000 UNIT) tablet [id number]  Take 1 tablet (1,000 Units total) by mouth daily. Paseda, Folashade R, FNP  Active   lamoTRIgine  (LAMICTAL ) 100 MG tablet [id number]  Take 1 tablet (100 mg total) by mouth 2 (two) times daily. For 5 days, take 1/2 pill daily, for next 5 days take 1/2 pill tice a day, for next 5 days, take 1/2 oill in the morning and 1 pill at night   then take 1 pill twice a day Sater, Charlie LABOR, MD  Active   natalizumab  (TYSABRI ) 300 MG/15ML injection [id number]  Inject 15 mLs (300 mg total) into the vein every 28 (twenty-eight) days.  Please fax to [phone]. Sater, Charlie LABOR, MD  Active Self  oxybutynin  (DITROPAN -XL) 10 MG 24 hr tablet [id number] Yes Take 1 tablet (10 mg total) by mouth daily. Paseda, Folashade R, FNP  Active             Recommendation:   Continue Current Plan of Care  Follow Up Plan:   Telephone follow up appointment date/time:  07/01/24 at 1:30 PM  Rosaline Arno, RN MSN [place]  [facility] RN Care Manager Direct Dial: [phone]  Fax: [phone]

## 2024-06-14 ENCOUNTER — Emergency Department (HOSPITAL_COMMUNITY)
Admission: EM | Admit: 2024-06-14 | Discharge: 2024-06-14 | Disposition: A | Discharge status: Home / Self Care | Attending: Emergency Medicine | Admitting: Emergency Medicine

## 2024-06-14 ENCOUNTER — Telehealth: Payer: Self-pay | Admitting: Urology

## 2024-06-14 ENCOUNTER — Telehealth (HOSPITAL_COMMUNITY): Payer: Self-pay | Admitting: Emergency Medicine

## 2024-06-14 DIAGNOSIS — T83098A Other mechanical complication of other indwelling urethral catheter, initial encounter: Secondary | ICD-10-CM | POA: Diagnosis present

## 2024-06-14 DIAGNOSIS — Y732 Prosthetic and other implants, materials and accessory gastroenterology and urology devices associated with adverse incidents: Secondary | ICD-10-CM | POA: Diagnosis not present

## 2024-06-14 DIAGNOSIS — R82998 Other abnormal findings in urine: Secondary | ICD-10-CM | POA: Insufficient documentation

## 2024-06-14 DIAGNOSIS — Z5329 Procedure and treatment not carried out because of patient's decision for other reasons: Secondary | ICD-10-CM | POA: Diagnosis not present

## 2024-06-14 DIAGNOSIS — T839XXA Unspecified complication of genitourinary prosthetic device, implant and graft, initial encounter: Secondary | ICD-10-CM

## 2024-06-14 LAB — URINALYSIS, W/ REFLEX TO CULTURE (INFECTION SUSPECTED)
Bilirubin Urine: NEGATIVE
Glucose, UA: NEGATIVE mg/dL
Ketones, ur: NEGATIVE mg/dL
Nitrite: POSITIVE — AB
Protein, ur: NEGATIVE mg/dL
Specific Gravity, Urine: 1.017 (ref 1.005–1.030)
WBC, UA: >50 WBC/hpf (ref 0–5)
pH: 6 (ref 5.0–8.0)

## 2024-06-14 MED ORDER — AMOXICILLIN-POT CLAVULANATE 875-125 MG PO TABS: 1.0000 | ORAL_TABLET | Freq: Two times a day (BID) | ORAL | 0 refills | Status: AC

## 2024-06-14 NOTE — Discharge Instructions (Signed)
 Bradley Brandt  Thank you for allowing us  to take care of you today.  You came to the Emergency Department today because you are having leakage around your Foley catheter.  You had darker, puslike urine in your old Foley, we exchanged your Foley for a slightly larger catheter, which reduce the risk of drainage.  Your urine was also concerning for UTI, therefore we wanted you to stay for us  to get a urinalysis, you wanted to leave, if you are urine culture comes back positive for infection you should get a call.   To-Do: 1. Please follow-up with your primary doctor as soon as possible.   Please return to the Emergency Department or call 911 if you experience have worsening of your symptoms, or do not get better, if you decide that you want to continue your workup for urinary tract infection, chest pain, shortness of breath, severe or significantly worsening pain, high fever, severe confusion, pass out or have any reason to think that you need emergency medical care.   We hope you feel better soon.   Mitzie Later, MD Department of Emergency Medicine  [facility] 

## 2024-06-14 NOTE — ED Triage Notes (Signed)
 Patient reports he needs his catheter changed Says it is leaking and his bed is getting wet  Denies pain

## 2024-06-14 NOTE — Telephone Encounter (Signed)
 Patient left AMA prior to return of UA.  UA returned with evidence of UTI.  Discussed with pharmacy, patient's microbiology is complex with multiple drug-resistant infections previously, however most recent was sensitive to Unasyn, thus will prescribe course of Augmentin , discussed strict return precautions with patient, if he has any new or worsening symptoms come back to the emergency department for further evaluation, and/or if he would like to come for IV antibiotics and very reasonable for him to come back to the emergency department for further evaluation.  Patient expressed understanding.

## 2024-06-14 NOTE — Telephone Encounter (Signed)
 Patient has 64F SP tube (latex foley), last exchanged in the office on 05/28/2024.  Over the last week or so the patient has had significant leakage, through his penis.  He denies any leakage around the SP tube itself.  I recommended that the patient proceed to the emergency department for a suprapubic catheter change.  I think the catheter is occluded, and it should be exchanged.  He will follow-up with us  as an outpatient as scheduled.

## 2024-06-14 NOTE — ED Provider Notes (Signed)
 " [place] EMERGENCY DEPARTMENT AT [facility] Provider Note   CSN: [id number] Arrival date & time: 06/14/24  1107     History Chief Complaint  Patient presents with   cathether issue   catheter issue    HPI: Bradley Brandt is a 43 y.o. male with history pertinent multiple sclerosis with indwelling suprapubic catheter, bipolar 1 who presents complaining of leakage around Foley catheter. Patient arrived via POV.  History provided by patient.  No interpreter required during this encounter.  Reports that he has had a chronic indwelling Foley for approximately 1 year.  Reports that he has had this current Foley catheter in place for approximately 2 weeks, noted that it was leaking today.  Denies any pain, trauma, chest pain, abdominal pain.  Reports that he has not had problems with leakage before, typically uses a 20 French catheter.  Patient's recorded medical, surgical, social, medication list and allergies were reviewed in the Snapshot window as part of the initial history.   Prior to Admission medications  Medication Sig Start Date End Date Taking? Authorizing Provider  baclofen  (LIORESAL ) 10 MG tablet Take 1 tablet (10 mg total) by mouth 3 (three) times daily. Patient taking differently: Take 10 mg by mouth 3 (three) times daily as needed. 01/30/24   Sater, Charlie LABOR, MD  brimonidine (ALPHAGAN) 0.2 % ophthalmic solution Place 1 drop into both eyes 3 (three) times daily. 03/06/24   [provider]  cholecalciferol (VITAMIN D3) 25 MCG (1000 UNIT) tablet Take 1 tablet (1,000 Units total) by mouth daily. 05/05/24   Paseda, Folashade R, FNP  lamoTRIgine  (LAMICTAL ) 100 MG tablet Take 1 tablet (100 mg total) by mouth 2 (two) times daily. For 5 days, take 1/2 pill daily, for next 5 days take 1/2 pill tice a day, for next 5 days, take 1/2 oill in the morning and 1 pill at night   then take 1 pill twice a day 05/26/24   Sater, Charlie LABOR, MD  natalizumab  (TYSABRI ) 300 MG/15ML  injection Inject 15 mLs (300 mg total) into the vein every 28 (twenty-eight) days. Please fax to [phone]. 01/17/24   Vear Charlie LABOR, MD  oxybutynin  (DITROPAN -XL) 10 MG 24 hr tablet Take 1 tablet (10 mg total) by mouth daily. 05/02/24   Paseda, Folashade R, FNP     Allergies: Lithium   Review of Systems   ROS as per HPI  Physical Exam Updated Vital Signs BP (!) 114/34 (BP Location: Right Arm)   Pulse (!) 56   Temp 98.3 F (36.8 C) (Oral)   Resp 16   Ht 5' 11 (1.803 m)   Wt 79.4 kg   SpO2 98%   BMI 24.41 kg/m  Physical Exam Vitals and nursing note reviewed.  Constitutional:      General: He is not in acute distress.    Appearance: He is well-developed.  HENT:     Head: Normocephalic and atraumatic.  Eyes:     Conjunctiva/sclera: Conjunctivae normal.  Cardiovascular:     Rate and Rhythm: Normal rate and regular rhythm.     Heart sounds: No murmur heard. Pulmonary:     Effort: Pulmonary effort is normal. No respiratory distress.     Breath sounds: Normal breath sounds.  Abdominal:     Palpations: Abdomen is soft.     Tenderness: There is no abdominal tenderness.     Comments: Leakage of urine around stoma  Musculoskeletal:        General: No swelling.  Cervical back: Neck supple.  Skin:    General: Skin is warm and dry.     Capillary Refill: Capillary refill takes less than 2 seconds.  Neurological:     Mental Status: He is alert.  Psychiatric:        Mood and Affect: Mood normal.     ED Course/ Medical Decision Making/ A&P    Procedures SUPRAPUBIC TUBE PLACEMENT  Date/Time: 06/14/2024 2:20 PM  Performed by: Rogelia Jerilynn RAMAN, MD Authorized by: Rogelia Jerilynn RAMAN, MD   Consent:    Consent obtained:  Verbal   Consent given by:  Patient   Risks discussed:  Bleeding, infection and pain   Alternatives discussed:  No treatment and alternative treatment Universal protocol:    Patient identity confirmed:  Verbally with patient Procedure details:     Complexity:  Simple   Catheter type:  Foley   Catheter size:  22 Fr   Ultrasound guidance: no     Number of attempts:  1   Urine characteristics:  Yellow and mildly cloudy Post-procedure details:    Procedure completion:  Tolerated well, no immediate complications Comments:     Indwelling 20 French suprapubic catheter was deflated, removed, and using sterile technique, new 22 French Foley catheter was placed balloon was inflated with 10 cc of sterile water .    Medications Ordered in ED Medications - No data to display  Medical Decision Making:   Bradley Brandt is a 43 y.o. male who presents for as per above.  Physical exam is pertinent for leakage of urine around catheter stoma.   The differential includes but is not limited to UTI, dilation of stoma, stomal trauma.  Independent historian: None  External data reviewed: No pertinent external data  Labs: Ordered, Independent interpretation, and Details: UA positive for nitrites, LE, numerous WBCs, bacteria  Radiology: Not indicated No results found.  EKG/Medicine tests: Not indicated EKG Interpretation:                  Interventions: None  See the EMR for full details regarding lab and imaging results.  Patient presents for purulent urine and leakage of urine around stoma Foley catheter.  Discussed that this could be from the etiology such as balloon rupture, UTI, or dilation of stoma requiring larger Foley catheter placement.  Patient would like to trial larger catheter, therefore 20 French catheter removed and replaced with 22 French catheter as per procedure note above.  Patient did have purulent urine, thus UA obtained, patient willing to let us  evaluate sample, however is unwilling to wait for results, despite discussion of risks and benefits, left AMA prior to results of UA.  After patient left, reviewed UA which is concerning for UTI with positive nitrites, LE, numerous bacteria and WBCs.  Review of prior culture  data reveals that patient has grown Unasyn sensitive Klebsiella UTIs.  Please see associated telephone note.  Presentation is most consistent with acute complicated illness and Current presentation is complicated by underlying chronic conditions  Discussion of management or test interpretations with external provider(s): None  Risk Drugs:None  Disposition: Patient left AMA prior to result of UA  MDM generated using voice dictation software and may contain dictation errors.  Please contact me for any clarification or with any questions.  Clinical Impression:  1. Problem with Foley catheter, initial encounter      AMA   Final Clinical Impression(s) / ED Diagnoses Final diagnoses:  Problem with Foley catheter, initial encounter  Rx / DC Orders ED Discharge Orders     None        Rogelia Jerilynn RAMAN, MD 06/14/24 [phone]  "

## 2024-06-16 LAB — URINE CULTURE: Culture: >=100000 — AB

## 2024-06-17 ENCOUNTER — Telehealth (HOSPITAL_BASED_OUTPATIENT_CLINIC_OR_DEPARTMENT_OTHER): Payer: Self-pay | Admitting: *Deleted

## 2024-06-17 NOTE — Telephone Encounter (Signed)
 Post ED Visit - Positive Culture Follow-up: Unsuccessful Patient Follow-up  Culture assessed and recommendations reviewed by:  [x]  EmilySinclair Pharm.D. []  Venetia Gully, Pharm.D., BCPS AQ-ID []  Garrel Crews, Pharm.D., BCPS []  Almarie Lunger, Pharm.D., BCPS []  [place], [place].D., BCPS, AAHIVP []  Rosaline Bihari, Pharm.D., BCPS, AAHIVP []  Massie Rigg, PharmD []  Jodie Rower, PharmD, BCPS  Positive urine culture reviewed by Ozell Arts Stop Augmentin  and start Bactrim  1 DS BID x 5 days []  Patient discharged without antimicrobial prescription and treatment is now indicated [x]  Organism is resistant to prescribed ED discharge antimicrobial []  Patient with positive blood cultures   Unable to contact patient after 3 attempts, letter will be sent to address on file  Jama Wyman Kipper 06/17/2024, 12:02 PM

## 2024-06-17 NOTE — Progress Notes (Signed)
 ED Antimicrobial Stewardship Positive Culture Follow Up   Bradley Brandt is an 43 y.o. male who presented to [facility] on 06/14/2024 with a chief complaint of  Chief Complaint  Patient presents with   cathether issue   catheter issue    Recent Results (from the past 720 hours)  Urine Culture     Status: Abnormal   Collection Time: 06/14/24  2:24 PM   Specimen: Urine, Random  Result Value Ref Range Status   Specimen Description   Final    URINE, RANDOM Performed at [facility], [address], [place], [place] [address]    Special Requests   Final    URINE, CLEAN CATCH Performed at [facility], [address], [place], [place] [address]    Culture >=100,000 COLONIES/mL ESCHERICHIA COLI (A)  Final   Report Status 06/16/2024 FINAL  Final   Organism ID, Bacteria ESCHERICHIA COLI (A)  Final      Susceptibility   Escherichia coli - MIC*    AMPICILLIN >=32 RESISTANT Resistant     CEFAZOLIN  (URINE) Value in next row Resistant      >=32 RESISTANTThis is a modified FDA-approved test that has been validated and its performance characteristics determined by the reporting laboratory.  This laboratory is certified under the Clinical Laboratory Improvement Amendments CLIA as qualified to perform high complexity clinical laboratory testing.    CEFEPIME  Value in next row Resistant      >=32 RESISTANTThis is a modified FDA-approved test that has been validated and its performance characteristics determined by the reporting laboratory.  This laboratory is certified under the Clinical Laboratory Improvement Amendments CLIA as qualified to perform high complexity clinical laboratory testing.    ERTAPENEM  Value in next row Sensitive      >=32 RESISTANTThis is a modified FDA-approved test that has been validated and its performance characteristics determined by the reporting laboratory.  This laboratory is certified under the Clinical Laboratory Improvement Amendments CLIA as  qualified to perform high complexity clinical laboratory testing.    CEFTRIAXONE  Value in next row Resistant      >=32 RESISTANTThis is a modified FDA-approved test that has been validated and its performance characteristics determined by the reporting laboratory.  This laboratory is certified under the Clinical Laboratory Improvement Amendments CLIA as qualified to perform high complexity clinical laboratory testing.    CIPROFLOXACIN  Value in next row Resistant      >=32 RESISTANTThis is a modified FDA-approved test that has been validated and its performance characteristics determined by the reporting laboratory.  This laboratory is certified under the Clinical Laboratory Improvement Amendments CLIA as qualified to perform high complexity clinical laboratory testing.    GENTAMICIN  Value in next row Sensitive      >=32 RESISTANTThis is a modified FDA-approved test that has been validated and its performance characteristics determined by the reporting laboratory.  This laboratory is certified under the Clinical Laboratory Improvement Amendments CLIA as qualified to perform high complexity clinical laboratory testing.    NITROFURANTOIN  Value in next row Sensitive      >=32 RESISTANTThis is a modified FDA-approved test that has been validated and its performance characteristics determined by the reporting laboratory.  This laboratory is certified under the Clinical Laboratory Improvement Amendments CLIA as qualified to perform high complexity clinical laboratory testing.    TRIMETH /SULFA  Value in next row Sensitive      >=32 RESISTANTThis is a modified FDA-approved test that has been validated and its performance characteristics determined  by the reporting laboratory.  This laboratory is certified under the Clinical Laboratory Improvement Amendments CLIA as qualified to perform high complexity clinical laboratory testing.    AMPICILLIN/SULBACTAM Value in next row Intermediate      >=32 RESISTANTThis is a  modified FDA-approved test that has been validated and its performance characteristics determined by the reporting laboratory.  This laboratory is certified under the Clinical Laboratory Improvement Amendments CLIA as qualified to perform high complexity clinical laboratory testing.    PIP/TAZO Value in next row Sensitive      <=4 SENSITIVEThis is a modified FDA-approved test that has been validated and its performance characteristics determined by the reporting laboratory.  This laboratory is certified under the Clinical Laboratory Improvement Amendments CLIA as qualified to perform high complexity clinical laboratory testing.    MEROPENEM  Value in next row Sensitive      <=4 SENSITIVEThis is a modified FDA-approved test that has been validated and its performance characteristics determined by the reporting laboratory.  This laboratory is certified under the Clinical Laboratory Improvement Amendments CLIA as qualified to perform high complexity clinical laboratory testing.    * >=100,000 COLONIES/mL ESCHERICHIA COLI    [x]  Treated with augmentin , organism resistant to prescribed antimicrobial []  Patient discharged originally without antimicrobial agent and treatment is now indicated  New antibiotic prescription: Stop augmentin . Start Bactrim  1 DS BID X 5 days   ED Provider: Ozell Arts, MD   Damien Quiet, PharmD, BCPS, BCIDP Infectious Diseases Clinical Pharmacist Phone: [phone] 06/17/2024, 8:51 AM

## 2024-07-01 ENCOUNTER — Telehealth: Payer: Self-pay

## 2024-07-02 ENCOUNTER — Ambulatory Visit (INDEPENDENT_AMBULATORY_CARE_PROVIDER_SITE_OTHER)

## 2024-07-02 ENCOUNTER — Other Ambulatory Visit: Payer: Self-pay

## 2024-07-02 VITALS — BP 114/60 | HR 45 | Temp 98.2°F | Resp 18 | Ht 71.0 in | Wt 180.0 lb

## 2024-07-02 DIAGNOSIS — G35D Multiple sclerosis, unspecified: Secondary | ICD-10-CM | POA: Diagnosis not present

## 2024-07-02 MED ORDER — SODIUM CHLORIDE 0.9 % IV SOLN: INTRAVENOUS | Status: DC

## 2024-07-02 MED ORDER — LORATADINE 10 MG PO TABS
10.0000 mg | ORAL_TABLET | Freq: Once | ORAL | Status: AC
  Administered 2024-07-02: 10 mg via ORAL
  Filled 2024-07-02: qty 1

## 2024-07-02 MED ORDER — SODIUM CHLORIDE 0.9 % IV SOLN
300.0000 mg | Freq: Once | INTRAVENOUS | Status: AC
  Administered 2024-07-02: 300 mg via INTRAVENOUS
  Filled 2024-07-02: qty 15

## 2024-07-02 MED ORDER — ACETAMINOPHEN 325 MG PO TABS
650.0000 mg | ORAL_TABLET | Freq: Once | ORAL | Status: AC
  Administered 2024-07-02: 650 mg via ORAL
  Filled 2024-07-02: qty 2

## 2024-07-02 NOTE — Patient Instructions (Signed)
 Visit Information  Mr. Maye was given information about Medicaid Managed Care team care coordination services as a part of their [facility] Community Plan Medicaid benefit.   If you would like to schedule transportation through your [facility], please call the following number at least 2 days in advance of your appointment: [phone]   Rides for urgent appointments can also be made after hours by calling Member Services.  Call the Behavioral Health Crisis Line at [phone], at any time, 24 hours a day, 7 days a week. If you are in danger or need immediate medical attention call 911.   Care plan and visit instructions communicated with the patient verbally today. Patient agrees to receive a copy in MyChart. Active MyChart status and patient understanding of how to access instructions and care plan via MyChart confirmed with patient.     Patient                                              will monitor for a call from [facility]. If you do not hear from them by next week, call them at [phone] to schedule your initial PT evaluation. Telephone follow up appointment with Managed Medicaid care management team member scheduled for: 07/31/24 at 3 PM  Rosaline Finlay, RN MSN [place]  VBC[facility] Population Health RN Care Manager Direct Dial: [phone]  Fax: [phone]   Following is a copy of your plan of care:   Goals Addressed             This Visit's Progress    VBCI RN Care Plan   On track    Problems:  Chronic Disease Management support and education needs related to multiple sclerosis and presence of suprapubic catheter  Goal: Over the next 30 days the Patient will demonstrate Ongoing adherence to prescribed treatment plan for Multiple Sclerosis as evidenced by patient report of decreased symptoms of muscle spasms and pain, compliance with medication  work with urology to manage bladder spasms as evidenced by  review of electronic medical record and patient or care team member report   Schedule initial PT evaluation as evidenced by chart review and patient report  Interventions:   Falls Interventions: Advised patient of importance of notifying provider of falls Assessed for falls since last encounter Advised patient that PT referral was sent to [facility] as of 06/30/24. Advised to give office about a week to process referral and reach out. They can be reached at [phone]  Evaluation of current treatment plan related to multiple sclerosis and suprapubic catheter self-management and patient's adherence to plan as established by provider. Discussed plans with patient for ongoing care management follow up and provided patient with direct contact information for care management team Advised patient to place cologuard kit next to his toilet so he is reminded to complete test Advised patient to discuss ongoing suprapubic pain/cramping with provider Assessed social determinant of health barriers Assessed for falls since previous CMRN visit Ensured patient has all medications and is taking as ordered   Patient Self-Care Activities:  Attend all scheduled provider appointments Call pharmacy for medication refills 3-7 days in advance of running out of medications Call provider office for new concerns or questions  Take medications as prescribed   Monitor for a call from [facility]. Reach out to them  next week if you have not heard from them yet - [phone]  Plan:  Telephone follow up appointment with care management team member scheduled for:  07/31/24 at 3 PM

## 2024-07-02 NOTE — Progress Notes (Signed)
 Diagnosis: Multiple Sclerosis  Provider:  Lonna Coder MD  Procedure: IV Infusion  IV Type: Peripheral, IV Location: R Forearm  Tysabri  (Natalizumab ), Dose: 300 mg  Infusion Start Time: 1416  Infusion Stop Time: 1517  Post Infusion IV Care: Patient declined observation and Peripheral IV Discontinued  Discharge: Condition: Good, Destination: Home . AVS Provided  Performed by:  Ixchel Duck, RN

## 2024-07-02 NOTE — Patient Outreach (Signed)
 Complex Care Management   Visit Note  07/02/2024  Name:  Bradley Brandt MRN: [id number] DOB: 01-27-1981  Situation: Referral received for Complex Care Management related to multiple sclerosis and presence of suprapubic catheter I obtained verbal consent from Patient.  Visit completed with Patient  on the phone  Background:   Past Medical History:  Diagnosis Date   Anemia    Anxiety    Bipolar 1 disorder (HCC)    Bipolar disorder, unspecified (HCC) 01/03/2013   MS (multiple sclerosis) dx 2006   relapsing-remitting right sided weakness   Neuromuscular disorder (HCC)    MS   Pneumonia    Right spastic hemiplegia (HCC)    Substance abuse (HCC)    Suprapubic catheter (HCC)     Assessment: Patient Reported Symptoms:  Cognitive Cognitive Status: Able to follow simple commands, Alert and oriented to person, place, and time, Normal speech and language skills, Requires Assistance Decision Making, Struggling with memory recall Cognitive/Intellectual Conditions Management [RPT]: None reported or documented in medical history or problem list   Health Maintenance Behaviors: Annual physical exam Health Facilitated by: Pain control, Rest  Neurological Neurological Review of Symptoms: No symptoms reported Neurological Management Strategies: Medication therapy, Routine screening  HEENT HEENT Symptoms Reported: No symptoms reported      Cardiovascular Cardiovascular Symptoms Reported: No symptoms reported Does patient have uncontrolled Hypertension?: No Cardiovascular Management Strategies: Routine screening  Respiratory Respiratory Symptoms Reported: No symptoms reported Respiratory Management Strategies: Coping strategies  Endocrine Endocrine Symptoms Reported: No symptoms reported Is patient diabetic?: No    Gastrointestinal Gastrointestinal Symptoms Reported: No symptoms reported Gastrointestinal Management Strategies: Incontinence garment/pad (Brief) Gastrointestinal Comment:  Patient reports he has not completed cologuard yet. He reports he keeps forgetting to complete. Advised patient to place test in the bathroom so that he sees it    Genitourinary Genitourinary Symptoms Reported: Other Other Genitourinary Symptoms: Patient reports continued suprapubic pain/cramping. He reports leaking out of his urethra. Patient reports catheter does appear to be draining well. He reports urine is clear and yellow. Patient reports he does have an upcoming appointment with urology, but is unsure when the appointment is Genitourinary Management Strategies: Coping strategies, Medical device (Suprapubic catheter) Genitourinary Comment: Note per chart review patient had an ED visit for clogged catheter 06/14/24 and catheter was exchanged.  Integumentary Integumentary Symptoms Reported: Not assessed    Musculoskeletal Musculoskelatal Symptoms Reviewed: Difficulty walking, Unsteady gait Musculoskeletal Management Strategies: Medication therapy, Medical device, Coping strategies, Adequate rest Musculoskeletal Comment: Patient denies falls since previous CMRN visit. Note per chart review PT referral sent to [facility] on 06/30/24. Advised patient to contact office if he has not heard from them within the next week Falls in the past year?: Yes Number of falls in past year: 2 or more Was there an injury with Fall?: No Fall Risk Category Calculator: 2 Patient Fall Risk Level: Moderate Fall Risk Patient at Risk for Falls Due to: History of fall(s), Impaired balance/gait Fall risk Follow up: Falls evaluation completed, Education provided, Falls prevention discussed  Psychosocial Psychosocial Symptoms Reported: Not assessed          07/02/2024    PHQ2-9 Depression Screening   Little interest or pleasure in doing things    Feeling down, depressed, or hopeless    PHQ-2 - Total Score    Trouble falling or staying asleep, or sleeping too much    Feeling tired or  having little energy    Poor appetite or overeating  Feeling bad about yourself - or that you are a failure or have let yourself or your family down    Trouble concentrating on things, such as reading the newspaper or watching television    Moving or speaking so slowly that other people could have noticed.  Or the opposite - being so fidgety or restless that you have been moving around a lot more than usual    Thoughts that you would be better off dead, or hurting yourself in some way    PHQ2-9 Total Score    If you checked off any problems, how difficult have these problems made it for you to do your work, take care of things at home, or get along with other people    Depression Interventions/Treatment      There were no vitals filed for this visit. Pain Scale: 0-10 Pain Score: 5  Pain Type: Acute pain Pain Location: Abdomen Pain Orientation: Lower  Medications Reviewed Today     Reviewed by Arno Rosaline SQUIBB, RN (Registered Nurse) on 07/02/24 at 1602  Med List Status: <None>   Medication Order Taking? Sig Documenting Provider Last Dose Status Informant     Discontinued 07/02/24 1558 (Patient Discharge)   amoxicillin -clavulanate (AUGMENTIN ) 875-125 MG tablet [id number]  Take 1 tablet by mouth every 12 (twelve) hours. Rogelia Jerilynn RAMAN, MD  Active   baclofen  (LIORESAL ) 10 MG tablet [id number]  Take 1 tablet (10 mg total) by mouth 3 (three) times daily.  Patient taking differently: Take 10 mg by mouth 3 (three) times daily as needed.   Sater, Charlie LABOR, MD  Active Self  brimonidine (ALPHAGAN) 0.2 % ophthalmic solution [id number]  Place 1 drop into both eyes 3 (three) times daily. [provider]  Active Self  cholecalciferol (VITAMIN D3) 25 MCG (1000 UNIT) tablet [id number]  Take 1 tablet (1,000 Units total) by mouth daily. Paseda, Folashade R, FNP  Active   lamoTRIgine  (LAMICTAL ) 100 MG tablet [id number]  Take 1 tablet (100 mg total) by mouth 2 (two) times daily. For 5 days,  take 1/2 pill daily, for next 5 days take 1/2 pill tice a day, for next 5 days, take 1/2 oill in the morning and 1 pill at night   then take 1 pill twice a day Sater, Charlie LABOR, MD  Active   natalizumab  (TYSABRI ) 300 mg in sodium chloride  0.9 % 100 mL IVPB [id number]   Vear Charlie LABOR, MD  Expired 07/02/24 1517   natalizumab  (TYSABRI ) 300 MG/15ML injection [id number]  Inject 15 mLs (300 mg total) into the vein every 28 (twenty-eight) days. Please fax to [phone]. Sater, Charlie LABOR, MD  Active Self  oxybutynin  (DITROPAN -XL) 10 MG 24 hr tablet [id number]  Take 1 tablet (10 mg total) by mouth daily. Paseda, Folashade R, FNP  Active             Recommendation:   Specialty provider follow-up urology Continue Current Plan of Care  Follow Up Plan:   Telephone follow up appointment date/time:  07/31/24 at 3 PM  Rosaline Arno, RN MSN [place]  [facility] RN Care Manager Direct Dial: [phone]  Fax: [phone]

## 2024-07-24 ENCOUNTER — Telehealth: Payer: Self-pay | Admitting: *Deleted

## 2024-07-24 NOTE — Telephone Encounter (Signed)
 MS Touch form questionnaire completed to be signed.

## 2024-07-30 ENCOUNTER — Ambulatory Visit

## 2024-07-31 ENCOUNTER — Telehealth

## 2024-09-05 ENCOUNTER — Encounter: Payer: Self-pay | Admitting: Nurse Practitioner

## 2024-12-25 ENCOUNTER — Ambulatory Visit: Admitting: Neurology
